# Patient Record
Sex: Female | Born: 1935 | Hispanic: No | Smoking: Never smoker
Health system: Southern US, Community
[De-identification: ages and names within clinical notes are randomized; demographics above are authoritative.]

## PROBLEM LIST (undated history)

## (undated) DIAGNOSIS — M1990 Unspecified osteoarthritis, unspecified site: Secondary | ICD-10-CM

## (undated) DIAGNOSIS — R251 Tremor, unspecified: Secondary | ICD-10-CM

## (undated) DIAGNOSIS — C801 Malignant (primary) neoplasm, unspecified: Secondary | ICD-10-CM

## (undated) DIAGNOSIS — E119 Type 2 diabetes mellitus without complications: Secondary | ICD-10-CM

## (undated) DIAGNOSIS — I10 Essential (primary) hypertension: Secondary | ICD-10-CM

## (undated) DIAGNOSIS — I4891 Unspecified atrial fibrillation: Secondary | ICD-10-CM

## (undated) DIAGNOSIS — G4730 Sleep apnea, unspecified: Secondary | ICD-10-CM

## (undated) DIAGNOSIS — D649 Anemia, unspecified: Secondary | ICD-10-CM

## (undated) HISTORY — DX: Type 2 diabetes mellitus without complications: E11.9

## (undated) HISTORY — PX: APPENDECTOMY: SHX54

## (undated) HISTORY — DX: Tremor, unspecified: R25.1

## (undated) HISTORY — PX: OTHER SURGICAL HISTORY: SHX169

## (undated) HISTORY — DX: Essential (primary) hypertension: I10

## (undated) HISTORY — PX: HERNIA REPAIR: SHX51

## (undated) HISTORY — PX: CARPAL TUNNEL RELEASE: SHX101

## (undated) HISTORY — PX: CATARACT EXTRACTION: SUR2

---

## 2001-03-30 ENCOUNTER — Emergency Department (HOSPITAL_COMMUNITY): Admission: EM | Admit: 2001-03-30 | Discharge: 2001-03-30 | Payer: Self-pay | Admitting: Emergency Medicine

## 2001-04-08 ENCOUNTER — Emergency Department (HOSPITAL_COMMUNITY): Admission: EM | Admit: 2001-04-08 | Discharge: 2001-04-08 | Payer: Self-pay | Admitting: Emergency Medicine

## 2001-07-02 ENCOUNTER — Ambulatory Visit (HOSPITAL_COMMUNITY): Admission: RE | Admit: 2001-07-02 | Discharge: 2001-07-02 | Payer: Self-pay | Admitting: Internal Medicine

## 2001-07-02 ENCOUNTER — Encounter: Payer: Self-pay | Admitting: Internal Medicine

## 2001-12-31 ENCOUNTER — Ambulatory Visit (HOSPITAL_COMMUNITY): Admission: RE | Admit: 2001-12-31 | Discharge: 2001-12-31 | Payer: Self-pay | Admitting: Internal Medicine

## 2001-12-31 ENCOUNTER — Encounter: Payer: Self-pay | Admitting: Internal Medicine

## 2002-04-07 ENCOUNTER — Encounter: Admission: RE | Admit: 2002-04-07 | Discharge: 2002-04-07 | Payer: Self-pay | Admitting: Gastroenterology

## 2002-04-07 ENCOUNTER — Encounter: Payer: Self-pay | Admitting: Gastroenterology

## 2003-03-10 ENCOUNTER — Ambulatory Visit (HOSPITAL_COMMUNITY): Admission: RE | Admit: 2003-03-10 | Discharge: 2003-03-10 | Payer: Self-pay | Admitting: Internal Medicine

## 2003-03-10 ENCOUNTER — Encounter: Payer: Self-pay | Admitting: Internal Medicine

## 2003-10-10 ENCOUNTER — Encounter: Admission: RE | Admit: 2003-10-10 | Discharge: 2003-10-10 | Payer: Self-pay | Admitting: Gastroenterology

## 2003-11-16 ENCOUNTER — Ambulatory Visit (HOSPITAL_COMMUNITY): Admission: RE | Admit: 2003-11-16 | Discharge: 2003-11-16 | Payer: Self-pay | Admitting: Gastroenterology

## 2005-05-16 ENCOUNTER — Ambulatory Visit (HOSPITAL_COMMUNITY): Admission: RE | Admit: 2005-05-16 | Discharge: 2005-05-16 | Payer: Self-pay | Admitting: Internal Medicine

## 2005-05-28 ENCOUNTER — Ambulatory Visit (HOSPITAL_COMMUNITY): Admission: RE | Admit: 2005-05-28 | Discharge: 2005-05-28 | Payer: Self-pay | Admitting: Internal Medicine

## 2005-09-10 ENCOUNTER — Ambulatory Visit (HOSPITAL_COMMUNITY): Admission: RE | Admit: 2005-09-10 | Discharge: 2005-09-10 | Payer: Self-pay | Admitting: Internal Medicine

## 2006-12-02 ENCOUNTER — Ambulatory Visit (HOSPITAL_COMMUNITY): Admission: RE | Admit: 2006-12-02 | Discharge: 2006-12-02 | Payer: Self-pay | Admitting: Internal Medicine

## 2007-04-27 ENCOUNTER — Ambulatory Visit (HOSPITAL_BASED_OUTPATIENT_CLINIC_OR_DEPARTMENT_OTHER): Admission: RE | Admit: 2007-04-27 | Discharge: 2007-04-27 | Payer: Self-pay | Admitting: Orthopedic Surgery

## 2009-01-30 ENCOUNTER — Ambulatory Visit: Payer: Self-pay | Admitting: Cardiovascular Disease

## 2009-02-26 ENCOUNTER — Telehealth (INDEPENDENT_AMBULATORY_CARE_PROVIDER_SITE_OTHER): Payer: Self-pay | Admitting: *Deleted

## 2009-02-26 ENCOUNTER — Ambulatory Visit: Payer: Self-pay

## 2009-02-27 ENCOUNTER — Encounter: Payer: Self-pay | Admitting: Cardiology

## 2009-02-27 ENCOUNTER — Ambulatory Visit: Payer: Self-pay

## 2009-03-10 DIAGNOSIS — R079 Chest pain, unspecified: Secondary | ICD-10-CM | POA: Insufficient documentation

## 2009-03-10 DIAGNOSIS — R0602 Shortness of breath: Secondary | ICD-10-CM | POA: Insufficient documentation

## 2009-03-10 DIAGNOSIS — R911 Solitary pulmonary nodule: Secondary | ICD-10-CM

## 2009-06-12 ENCOUNTER — Ambulatory Visit: Payer: Self-pay | Admitting: Vascular Surgery

## 2009-06-12 ENCOUNTER — Encounter: Payer: Self-pay | Admitting: Internal Medicine

## 2009-06-12 ENCOUNTER — Ambulatory Visit (HOSPITAL_COMMUNITY): Admission: RE | Admit: 2009-06-12 | Discharge: 2009-06-12 | Payer: Self-pay | Admitting: Internal Medicine

## 2010-08-23 ENCOUNTER — Encounter: Admission: RE | Admit: 2010-08-23 | Discharge: 2010-08-23 | Payer: Self-pay | Admitting: Internal Medicine

## 2011-01-28 ENCOUNTER — Ambulatory Visit (HOSPITAL_COMMUNITY)
Admission: RE | Admit: 2011-01-28 | Discharge: 2011-01-28 | Disposition: A | Discharge status: Home / Self Care | Payer: Medicare Other | Source: Ambulatory Visit | Attending: Internal Medicine | Admitting: Internal Medicine

## 2011-01-28 ENCOUNTER — Other Ambulatory Visit: Payer: Self-pay | Admitting: Internal Medicine

## 2011-01-28 DIAGNOSIS — R05 Cough: Secondary | ICD-10-CM | POA: Insufficient documentation

## 2011-01-28 DIAGNOSIS — R52 Pain, unspecified: Secondary | ICD-10-CM

## 2011-01-28 DIAGNOSIS — M47814 Spondylosis without myelopathy or radiculopathy, thoracic region: Secondary | ICD-10-CM | POA: Insufficient documentation

## 2011-01-28 DIAGNOSIS — I517 Cardiomegaly: Secondary | ICD-10-CM | POA: Insufficient documentation

## 2011-01-28 DIAGNOSIS — I10 Essential (primary) hypertension: Secondary | ICD-10-CM | POA: Insufficient documentation

## 2011-01-28 DIAGNOSIS — R509 Fever, unspecified: Secondary | ICD-10-CM | POA: Insufficient documentation

## 2011-01-28 DIAGNOSIS — R059 Cough, unspecified: Secondary | ICD-10-CM | POA: Insufficient documentation

## 2011-03-19 ENCOUNTER — Other Ambulatory Visit: Payer: Self-pay | Admitting: Neurosurgery

## 2011-03-19 DIAGNOSIS — M545 Low back pain: Secondary | ICD-10-CM

## 2011-03-26 ENCOUNTER — Ambulatory Visit
Admission: RE | Admit: 2011-03-26 | Discharge: 2011-03-26 | Disposition: A | Discharge status: Home / Self Care | Payer: Medicare Other | Source: Ambulatory Visit | Attending: Neurosurgery | Admitting: Neurosurgery

## 2011-03-26 DIAGNOSIS — M545 Low back pain: Secondary | ICD-10-CM

## 2011-04-01 NOTE — Op Note (Signed)
NAMEBRENLY, Anne Roach NO.:  [id number]   MEDICAL RECORD NO.:  [id number]          PATIENT TYPE:  AMB   LOCATION:  DSC                          FACILITY:  [facility]   PHYSICIAN:  Leonides Grills, M.D.     DATE OF BIRTH:  01/24/1933   DATE OF PROCEDURE:  04/27/2007  DATE OF DISCHARGE:                               OPERATIVE REPORT   PREOPERATIVE DIAGNOSIS:  Bilateral tarsal tunnel syndrome.   POSTOPERATIVE DIAGNOSIS:  Bilateral tarsal tunnel syndrome.   OPERATION:  Bilateral tarsal tunnel release.   ANESTHESIA:  General.   SURGEON:  Leonides Grills, M.D.   ASSISTANT:  None.   TOURNIQUET TIME:  Approximately 45 minutes.   COMPLICATIONS:  None.   DISPOSITION:  Stable to PR.   INDICATIONS:  This is a 77 year old female who has had long standing  progressive burning pain on the plantar aspect of her foot and EMG nerve  conduction studies showed that she had bilateral tarsal tunnel syndrome.  She was consented for the above procedure.  All risks which include  infection, neurovascular injury, persistent pain, worsening pain, nerve  vessel injury specific to the posteromedial aspect of the ankle,  prolonged recovery were all explained, questions encouraged and  answered.   DESCRIPTION OF PROCEDURE:  The patient was brought to the operating room  and placed in the supine position. After adequate general endotracheal  anesthesia was administered as well as Ancef 1 gram IV piggyback,  bilateral lower extremities were prepped and draped in a sterile manner  to a proximally placed thigh tourniquet.  The limb was gravity saline  and the tourniquet elevated to 290 mmHg.  A curvilinear incision on the  posteromedial aspect of the right ankle was done.  Dissection was  carried down through the skin.  Hemostasis was obtained.  Flexor  retinaculum was then identified and this was then released with pickups  and scissors protecting the neurovascular structures deep to this  structure. Dissection was carried down distally to the abductor halluces  and the inner portion of the fascia was released, as well.  The  tourniquet was deflated, hemostasis was obtained.  There is no pulsatile  bleeding.  The area was copiously irrigated with normal saline.  The  subcu was closed with 3-0 Vicryl, the skin was closed with 4-0 nylon.  The same exact procedure was performed on the contralateral left side as  described previously.  Once the wounds were closed, a sterile dressing  was applied, a Cam walker boot was applied.  The patient was stable to  the PR.   POSTOPERATIVE COURSE:  The patient will follow up in two weeks.  At that  time, remove the dressing as well as suture.  She is then to go into a  normal shoe weight bearing as tolerated.  Elevation and active range of  motion of the toes is encouraged.  She is explained that it may take up  to six months to a year to get full relief from the surgery.      Leonides Grills, M.D.  Electronically Signed     PB/MEDQ  D:  04/27/2007  T:  04/27/2007  Job:  [id number]

## 2011-04-01 NOTE — Assessment & Plan Note (Signed)
[facility]                            CARDIOLOGY OFFICE NOTE   NAME:Coopman, SAVAHNA CASADOS                     MRN:          [id number]  DATE:01/30/2009                            DOB:          Aug 30, 1933    HISTORY OF PRESENT ILLNESS:  Ms. Westra is a very pleasant 77 year old  [place] female referred by Dr. Chestine Spore for chest pain, shortness  breath, and hypertension.  Last Tuesday, the patient was visiting  [place].  She had walked for a while, she then used a wheelchair to get  around.  However, to get to some of the higher rooms, she had to walk up  some stairs.  She developed an episode of shortness of breath and  substernal chest pain.  There is no diaphoresis.  The pain was in the  center of her chest, it radiated to her neck and down her arms.  It  lasted for 10-15 minutes.  She took a break and then felt okay.  She  subsequently saw Dr. Chestine Spore.  She has not had a recurrence.  In general,  the patient has some exertional dyspnea and fatigue.  She has  hypertension and has been compliant with her meds.  She has no  previously documented coronary artery disease.  Her activity is somewhat  limited by bilateral prior plantar fasciitis.  She has had tarsal tunnel  release bilaterally.   Dr. Chestine Spore did an EKG and felt it was different than her previous EKGs.  I do not have that EKG, but her EKG in the office today shows  significantly poor R-wave progression suggestive of a possible anterior  wall infarct.   The patient is originally from [place].  She has not had any cardiac  problems since coming to this country in 2002.  I do not have a  cholesterol on her.  She is a nonsmoker.   FAMILY HISTORY:  Negative for premature coronary artery  disease.   REVIEW OF SYSTEMS:  Otherwise negative.   PAST MEDICAL HISTORY:  Remarkable for previous umbilical hernia repair,  carpal tunnel surgery, and tarsal tunnel surgery in both feet.   MEDICATIONS:  Include  1. Amlodipine 10 mg a day.  2. Benazepril 20 mg a day.   SOCIAL HISTORY:  The patient is retired.  She came to this country in  2002 from [place].  Her daughters were in this country earlier, they came  over as children.  She does housework and activities of daily living and  cooks.  She has one grandchild.  She does not smoke or drink.   PHYSICAL EXAMINATION:  GENERAL:  Remarkable for pleasant [place]  female, in no distress.  VITAL SIGNS:  Her blood pressure is 130/80, pulse 71 and regular,  respiratory rate 14, afebrile.  Weight is 198.  HEENT:  Unremarkable.  NECK:  Carotids are normal without bruit.  No lymphadenopathy,  thyromegaly, or JVP elevation.  LUNGS:  Clear.  Good diaphragmatic motion.  No wheezing.  S1 and S2.  Normal heart sounds.  PMI normal.  ABDOMEN:  Benign.  Bowel sounds positive.  No AAA, no  tenderness, no  bruit, no hepatosplenomegaly, and no hepatojugular reflux.  EXTREMITIES:  Distal pulses are intact.  No edema.  NEUROLOGIC:  Nonfocal.  SKIN:  Warm and dry.  MUSCULOSKELETAL:  No muscular weakness.  She has bilateral tarsal tunnel  scars on the medial malleolus area.   Her EKG shows sinus rhythm with poor R-wave progression, possible old  anterior wall MI.   IMPRESSION:  1. Chest pain with abnormal EKG.  The patient unable to walk on a      treadmill due to previous plantar fasciitis and surgery.  Followup      adenosine Myoview.  2. Hypertension, currently well controlled.  Continue current dose of      ACE inhibitor and calcium channel blocker.  3. A tarsal tunnel surgery with bilateral chronic foot pain.  Follow      up with Dr. Lestine Box.   As long as the patient's stress Myoview is normal, we will see her on an  as-needed basis.  If it shows evidence of ischemia or previous  infarction, I will see her back in followup to probably recommend a  heart cath.   Her abnormal EKG may represent lead position and rotation from LVH, but  there is a very  much a lack of R-wave progression in the precordium.     Noralyn Pick. Eden Emms, MD, [facility]  Electronically Signed    PCN/MedQ  DD: 01/30/2009  DT: 01/31/2009  Job #: [id number]   cc:   Margaretmary Bayley, M.D.

## 2011-06-12 ENCOUNTER — Emergency Department (HOSPITAL_COMMUNITY)
Admission: EM | Admit: 2011-06-12 | Discharge: 2011-06-12 | Disposition: A | Discharge status: Home / Self Care | Payer: Medicare Other | Attending: Emergency Medicine | Admitting: Emergency Medicine

## 2011-06-12 ENCOUNTER — Emergency Department (HOSPITAL_COMMUNITY): Payer: Medicare Other

## 2011-06-12 ENCOUNTER — Other Ambulatory Visit: Payer: Self-pay | Admitting: Cardiovascular Disease

## 2011-06-12 DIAGNOSIS — N390 Urinary tract infection, site not specified: Secondary | ICD-10-CM | POA: Insufficient documentation

## 2011-06-12 DIAGNOSIS — I10 Essential (primary) hypertension: Secondary | ICD-10-CM | POA: Insufficient documentation

## 2011-06-12 DIAGNOSIS — E119 Type 2 diabetes mellitus without complications: Secondary | ICD-10-CM | POA: Insufficient documentation

## 2011-06-12 DIAGNOSIS — I517 Cardiomegaly: Secondary | ICD-10-CM | POA: Insufficient documentation

## 2011-06-12 DIAGNOSIS — R072 Precordial pain: Secondary | ICD-10-CM | POA: Insufficient documentation

## 2011-06-12 DIAGNOSIS — R079 Chest pain, unspecified: Secondary | ICD-10-CM

## 2011-06-12 LAB — CBC
HCT: 42.6 % (ref 36.0–46.0)
Hemoglobin: 14.1 g/dL (ref 12.0–15.0)
MCH: 29.9 pg (ref 26.0–34.0)
MCHC: 33.1 g/dL (ref 30.0–36.0)
MCV: 90.4 fL (ref 78.0–100.0)
Platelets: 180 10*3/uL (ref 150–400)
RBC: 4.71 MIL/uL (ref 3.87–5.11)
RDW: 14.4 % (ref 11.5–15.5)
WBC: 9 10*3/uL (ref 4.0–10.5)

## 2011-06-12 LAB — URINE MICROSCOPIC-ADD ON

## 2011-06-12 LAB — URINALYSIS, ROUTINE W REFLEX MICROSCOPIC
Bilirubin Urine: NEGATIVE
Glucose, UA: 500 mg/dL — AB
Hgb urine dipstick: NEGATIVE
Ketones, ur: NEGATIVE mg/dL
Nitrite: POSITIVE — AB
Protein, ur: NEGATIVE mg/dL
Specific Gravity, Urine: 1.027 (ref 1.005–1.030)
Urobilinogen, UA: 0.2 mg/dL (ref 0.0–1.0)
pH: 5 (ref 5.0–8.0)

## 2011-06-12 LAB — DIFFERENTIAL
Basophils Absolute: 0 10*3/uL (ref 0.0–0.1)
Basophils Relative: 0 % (ref 0–1)
Eosinophils Absolute: 0.1 10*3/uL (ref 0.0–0.7)
Eosinophils Relative: 1 % (ref 0–5)
Lymphocytes Relative: 40 % (ref 12–46)
Lymphs Abs: 3.6 10*3/uL (ref 0.7–4.0)
Monocytes Absolute: 0.8 10*3/uL (ref 0.1–1.0)
Monocytes Relative: 9 % (ref 3–12)
Neutro Abs: 4.5 10*3/uL (ref 1.7–7.7)
Neutrophils Relative %: 50 % (ref 43–77)

## 2011-06-12 LAB — CK TOTAL AND CKMB (NOT AT ARMC)
CK, MB: 1.9 ng/mL (ref 0.3–4.0)
Relative Index: INVALID (ref 0.0–2.5)
Total CK: 33 U/L (ref 7–177)

## 2011-06-12 LAB — BASIC METABOLIC PANEL
BUN: 14 mg/dL (ref 6–23)
CO2: 27 mEq/L (ref 19–32)
Calcium: 9.2 mg/dL (ref 8.4–10.5)
Chloride: 102 mEq/L (ref 96–112)
Creatinine, Ser: 0.81 mg/dL (ref 0.50–1.10)
GFR calc Af Amer: >60 mL/min (ref >60)
GFR calc non Af Amer: >60 mL/min (ref >60)
Glucose, Bld: 254 mg/dL — ABNORMAL HIGH (ref 70–99)
Potassium: 4 mEq/L (ref 3.5–5.1)
Sodium: 139 mEq/L (ref 135–145)

## 2011-06-12 LAB — TROPONIN I: Troponin I: <0.3 ng/mL (ref <0.30)

## 2011-06-14 LAB — URINE CULTURE
Colony Count: >=100000
Culture  Setup Time: 201207261650

## 2011-06-16 MED ORDER — NITROGLYCERIN 0.4 MG SL SUBL: 0.4000 mg | SUBLINGUAL_TABLET | SUBLINGUAL | Status: AC | PRN

## 2011-06-17 ENCOUNTER — Ambulatory Visit (HOSPITAL_COMMUNITY): Payer: Medicare Other | Attending: Cardiovascular Disease | Admitting: Radiology

## 2011-06-17 VITALS — Ht 66.0 in | Wt 190.0 lb

## 2011-06-17 DIAGNOSIS — I4949 Other premature depolarization: Secondary | ICD-10-CM

## 2011-06-17 DIAGNOSIS — R079 Chest pain, unspecified: Secondary | ICD-10-CM | POA: Insufficient documentation

## 2011-06-17 MED ORDER — TECHNETIUM TC 99M TETROFOSMIN IV KIT
11.0000 | PACK | Freq: Once | INTRAVENOUS | Status: AC | PRN
  Administered 2011-06-17: 11 via INTRAVENOUS

## 2011-06-17 MED ORDER — TECHNETIUM TC 99M TETROFOSMIN IV KIT
33.0000 | PACK | Freq: Once | INTRAVENOUS | Status: AC | PRN
  Administered 2011-06-17: 33 via INTRAVENOUS

## 2011-06-17 MED ORDER — REGADENOSON 0.4 MG/5ML IV SOLN
0.4000 mg | Freq: Once | INTRAVENOUS | Status: AC
  Administered 2011-06-17: 0.4 mg via INTRAVENOUS

## 2011-06-17 NOTE — Progress Notes (Signed)
[facility] SITE 3 NUCLEAR MED [address] [place] [address] [phone]  Cardiology Nuclear Med Study  Anne Roach is a 77 y.o. female [id number] 1933/07/23   Nuclear Med Background Indication for Stress Test:  Evaluation for Ischemia and Post Hospital:06/12/11 ED with CP, (-)enzymes History:02/27/09-Myocardial Perfusion Study(NL EF=67%),Palpitations Cardiac Risk Factors: Family History - CAD, Hypertension and NIDDM  Symptoms:  Chest Pain, Diaphoresis, Dizziness, DOE, Light-Headedness, Palpitations and Syncope   Nuclear Pre-Procedure Caffeine/Decaff Intake:  None NPO After: 7:00am   Lungs:  CLEAR IV 0.9% NS with Angio Cath:  20g  IV Site: L Antecubital  IV Started by:  Irean Hong, RN  Chest Size (in):  38 Cup Size: B  Height: 5\' 6"  (1.676 m)  Weight:  190 lb (86.183 kg)  BMI:  Body mass index is 30.67 kg/(m^2). Tech Comments:  n/a    Nuclear Med Study 1 or 2 day study: 1 day  Stress Test Type:  Eugenie Birks  Reading MD: [name], MD  Order Authorizing Provider:  P.Nishan,MD  Resting Radionuclide: Technetium 32m Tetrofosmin  Resting Radionuclide Dose: 11.0 mCi   Stress Radionuclide:  Technetium 21m Tetrofosmin  Stress Radionuclide Dose: 33.0 mCi           Stress Protocol Rest HR: 68 Stress HR: 76  Rest BP: 142/87 Stress BP: 143/77  Exercise Time (min): n/a METS: n/a   Predicted Max HR: 144 bpm % Max HR: 52.78 bpm Rate Pressure Product: 21308   Dose of Adenosine (mg):  n/a Dose of Lexiscan: 0.4 mg  Dose of Atropine (mg): n/a Dose of Dobutamine: n/a mcg/kg/min (at max HR)  Stress Test Technologist: Frederick Peers, EMT-P  Nuclear Technologist:  Doyne Keel, CNMT     Rest Procedure:  Myocardial perfusion imaging was performed at rest 45 minutes following the intravenous administration of Technetium 11m Tetrofosmin. Rest ECG: SR with frequent PVCs, initial run of trigemeny  Stress Procedure:  The patient received IV Lexiscan 0.4 mg over  15-seconds.  Technetium 40m Tetrofosmin injected at 30-seconds.  There were no significant changes with Lexiscan.frequent PVCs throughout test.  Quantitative spect images were obtained after a 45 minute delay. Stress ECG: No significant ST segment change suggestive of ischemia.  QPS Raw Data Images:  Normal; no motion artifact; normal heart/lung ratio. Stress Images:  Normal homogeneous uptake in all areas of the myocardium. Rest Images:  Normal homogeneous uptake in all areas of the myocardium. Subtraction (SDS):  No evidence of ischemia. Transient Ischemic Dilatation (Normal <1.22):  0.92 Lung/Heart Ratio (Normal <0.45):  0.35  Quantitative Gated Spect Images QGS EDV:  NA QGS ESV:NA QGS cine images:  NA QGS EF: NA  Impression Exercise Capacity:  Lexiscan with no exercise. BP Response:  Normal blood pressure response. Clinical Symptoms:  No chest pain. ECG Impression:  No significant ST segment change suggestive of ischemia. Comparison with Prior Nuclear Study: No significant change from previous study  Overall Impression:  Low risk stress nuclear study.  No ischemia.  Ventricular function not available because of nongated study because of frequent ectopy.   Cassell Clement

## 2011-06-18 NOTE — Progress Notes (Signed)
Nuclear report routed to Dr. Nishan. Anne Roach  

## 2011-06-23 NOTE — Progress Notes (Signed)
pt aware of results Anne Roach  

## 2011-07-02 ENCOUNTER — Telehealth: Payer: Self-pay | Admitting: Cardiovascular Disease

## 2011-07-02 NOTE — Telephone Encounter (Signed)
Pt call pt wants to know results of stress test. Please return pt call to advise/discuss.

## 2011-07-02 NOTE — Telephone Encounter (Signed)
Pt aware of results, results faxed to dr Chestine Spore at her request Deliah Goody

## 2011-07-08 NOTE — Consult Note (Signed)
NAMEHAJER, DWYER NO.:  [id number]  MEDICAL RECORD NO.:  [id number]  LOCATION:  [facility]                         FACILITY:  [facility]  PHYSICIAN:  Noralyn Pick. Eden Emms, MD, FACCDATE OF BIRTH:  10-02-1933  DATE OF CONSULTATION:  06/12/2011 DATE OF DISCHARGE:  06/12/2011                                CONSULTATION   A 77 year old patient I have seen previously in March 2010 presents to the ER with three discrete episodes of chest pain today.  In general, she has not had any chest pain since I last saw her in 2010.  Her coronary risk factors include hypertension and diabetes.  Her diabetes would appear somewhat poorly controlled.  Her blood sugars run in 130- 150 in the mornings and apparently, her hemoglobin A1c was in the 8 range.  She woke this morning and while watching TV, had about a minute episode of pain in her shoulders and left side of her chest.  It was a squeezing-type sensation.  It was relieved spontaneously, but recurred twice.  She has not had any pain since she has been in the emergency room.  There is no associated shortness of breath, diaphoresis, palpitations, or presyncope.  The patient is currently comfortable with stable hemodynamics and pain- free.  Her enzymes are negative.  Her electrocardiogram has shown sinus rhythm with no acute changes.  She has an occasional PVC and poor R-wave progression.  There has been no change in her ECG since 2010.  She also has left axis deviation.  I had the patient's 10-point review of systems otherwise negative.  FAMILY HISTORY:  Negative for premature coronary artery disease, positive for diabetes on mother's side.  PAST MEDICAL HISTORY:  Remarkable for umbilical hernia repair, carpal tunnel surgery, tarsal tunnel surgery bilaterally with chronic foot problems.  She has required epidurals for this most recently 2 weeks ago.  She does not have a list of her medications.  She is on oral hypoglycemic, which  she does not know and also on amlodipine 10 mg a day.  She denies any allergies.  The patient is retired.  She came to this country in 2002 from [place].  Her daughter was with air.  She basically does activities of daily living and is sedentary.  She does not smoke or drink.  PHYSICAL EXAMINATION:  GENERAL:  Remarkable for a comfortable female in no pain, blood pressure is 130/60, pulse 70 and regular, respiratory rate 14, afebrile. HEENT:  Unremarkable. NECK:  Carotids are normal without bruit.  No lymphadenopathy, thyromegaly, or JVP elevation. LUNGS:  Clear.  Good diaphragmatic motion.  No wheezing.  S1 and S2, normal heart sounds.  PMI normal. ABDOMEN:  Benign.  Bowel sounds positive.  No AAA.  No tenderness.  No bruit.  No hepatosplenomegaly or hepatojugular reflux, or tenderness. EXTREMITIES:  Distal pulses are intact with trace edema.  She is status post tarsal tunnel surgery bilaterally with hammertoes.  EKG shows sinus rhythm with poor R-wave progression, left axis deviation and a single PVC.  No acute ST elevation.  No acute T-wave changes.  Lab work is remarkable for negative CPK, negative troponin.  CBC showed hematocrit of 42.6.  BMET showing  potassium of 4, creatinine of 0.8. Diagnostic chest x-ray shows question of cardiomegaly with no acute process.  IMPRESSION: 1. Chest pain, atypical features, self-limited, previously normal     Myoview in our office in 2010.  I talked to the patient and her     daughter.  I gave them the opportunity of coming into the hospital     overnight and doing a Myoview scan tomorrow morning or going home     and having this scheduled as an outpatient.  I think she is low     risk for cardiac event.  She prefers to go home.  I will call in a     prescription for nitroglycerin to the [facility] on [address].     She knows she can come back to the emergency room if her episodes     recur.  I decided not to do a cardiac CT and the patient  since she     is elderly with diabetes and is likely to have a higher calcium     score that may make interpretation more difficult also she was     having an occasional benign PVC. We have an old Myoview to compare, and I think this is the best option for her.  She will need Lexiscan scan as she does not walk well due to her plantar fasciitis surgery. 1. Diabetes.  Encouraged her to follow up with Dr. Chestine Spore, told her     that a hemoglobin A1c of 8 is unacceptable as her morning sugar is     of 130-150.  She appears to be only on a single oral hypoglycemic     agent. 2. Hypertension currently well controlled.  Continue current     medications including amlodipine and low-sodium diet. I have already made an appointment for her June 17, 2011, at noon at our office to have a [employer], and we will call a prescription for nitroglycerin.  The details of this plan were discussed with the daughter and the patient.  They are both agreeable.     Noralyn Pick. Eden Emms, MD, [facility]     PCN/MEDQ  D:  06/12/2011  T:  06/13/2011  Job:  [id number]  Electronically Signed by [name] MD [facility] on 07/08/2011 09:21:10 AM

## 2011-09-04 LAB — BASIC METABOLIC PANEL
CO2: 29
Calcium: 9.3
Creatinine, Ser: 0.69
GFR calc Af Amer: >60

## 2011-09-04 LAB — POCT HEMOGLOBIN-HEMACUE: Hemoglobin: 17.1 — ABNORMAL HIGH

## 2011-12-17 DIAGNOSIS — R5381 Other malaise: Secondary | ICD-10-CM | POA: Diagnosis not present

## 2011-12-17 DIAGNOSIS — D649 Anemia, unspecified: Secondary | ICD-10-CM | POA: Diagnosis not present

## 2011-12-17 DIAGNOSIS — I10 Essential (primary) hypertension: Secondary | ICD-10-CM | POA: Diagnosis not present

## 2011-12-17 DIAGNOSIS — E7800 Pure hypercholesterolemia, unspecified: Secondary | ICD-10-CM | POA: Diagnosis not present

## 2011-12-17 DIAGNOSIS — M2550 Pain in unspecified joint: Secondary | ICD-10-CM | POA: Diagnosis not present

## 2011-12-17 DIAGNOSIS — L659 Nonscarring hair loss, unspecified: Secondary | ICD-10-CM | POA: Diagnosis not present

## 2012-02-03 ENCOUNTER — Ambulatory Visit (HOSPITAL_BASED_OUTPATIENT_CLINIC_OR_DEPARTMENT_OTHER): Payer: Medicare Other | Attending: Internal Medicine | Admitting: Radiology

## 2012-02-03 VITALS — Ht 66.0 in | Wt 198.0 lb

## 2012-02-03 DIAGNOSIS — G4733 Obstructive sleep apnea (adult) (pediatric): Secondary | ICD-10-CM | POA: Diagnosis not present

## 2012-02-03 DIAGNOSIS — Z9989 Dependence on other enabling machines and devices: Secondary | ICD-10-CM

## 2012-02-15 DIAGNOSIS — R0609 Other forms of dyspnea: Secondary | ICD-10-CM | POA: Diagnosis not present

## 2012-02-15 DIAGNOSIS — R0989 Other specified symptoms and signs involving the circulatory and respiratory systems: Secondary | ICD-10-CM

## 2012-02-15 DIAGNOSIS — I4949 Other premature depolarization: Secondary | ICD-10-CM | POA: Diagnosis not present

## 2012-02-15 DIAGNOSIS — I491 Atrial premature depolarization: Secondary | ICD-10-CM

## 2012-02-15 DIAGNOSIS — G4733 Obstructive sleep apnea (adult) (pediatric): Secondary | ICD-10-CM | POA: Diagnosis not present

## 2012-02-16 NOTE — Procedures (Signed)
NAMEZELTA, ENFIELD NO.:  [id number]  MEDICAL RECORD NO.:  [id number]          PATIENT TYPE:  OUT  LOCATION:  SLEEP CENTER                 FACILITY[facility]  [facility]  PHYSICIAN:  Barbaraann Share, MD,FCCPDATE OF BIRTH:  [birth year] 1934  DATE OF STUDY:  02/03/2012                           NOCTURNAL POLYSOMNOGRAM  REFERRING PHYSICIAN:  Margaretmary Bayley, M.D.  LOCATION:  Sleep Lab.  REFERRING PHYSICIAN:  Margaretmary Bayley, M.D.  INDICATION FOR STUDY:  Hypersomnia with sleep apnea.  EPWORTH SLEEPINESS SCORE:  8.  MEDICATIONS:  SLEEP ARCHITECTURE:  The patient had total sleep time of 322 minutes with no slow-wave sleep and only 23 minutes of REM.  Sleep onset latency was normal at 24 minutes, and REM did not occur until the titration portion of the sleep study.  Sleep efficiency was poor at 77% during the diagnostic portion, and 80% during the titration portion.  RESPIRATORY DATA:  The patient underwent a split night protocol, where she was found to have 178 obstructive events in the 1st 123 minutes of sleep.  This gave her an apnea-hypopnea index of 87 events per hour during the diagnostic portion of the study.  The events occurred all in the supine position and there was moderate to loud snoring noted throughout.  By protocol, the patient was then fitted with a medium ResMed Mirage Quattro full face mask, and CPAP titration was initiated. At a CPAP pressure of 19 cm of water, she continued to have breakthrough events and was having difficulty with tolerance, and so therefore, was changed to bilevel.  Her bilevel pressure was increased as high as 25/20, but there were was very little time left for further titration. Optimal pressure was never defined.  OXYGEN DATA:  There was O2 desaturation as low as 87% with the patient's obstructive events.  CARDIAC DATA:  Rare PAC and PVC noted, but no clinically significant arrhythmias were seen.  MOVEMENT-PARASOMNIA:  The patient had  no significant leg jerks or other abnormal behaviors noted.  IMPRESSION-RECOMMENDATIONS: 1. Split night study reveals severe obstructive sleep apnea with an     AHI of 87 events per hour during the diagnostic portion of the     study, and oxygen desaturation as low as 87%.  The patient was then     fitted with a medium ResMed Mirage Quattro full face mask, and CPAP     titration was initiated.  At a CPAP pressure of 19 cm of water, she     continued to have breakthrough events and had issues with pressure     tolerance, and therefore, was changed to bilevel.  Unfortunately,     there is very little time left for appropriate bilevel titration,     and therefore I would recommend either an auto-titrating device at     home for a period of a few weeks versus a return to the sleep     center for formal titration.  The patient should also be encouraged     to work aggressively on weight loss. 2. Rare PAC and PVC noted, but no clinically significant arrhythmias     were seen.     Barbaraann Share, MD,FCCP Diplomate,  American Board of Sleep Medicine    [facility]/MEDQ  D:  02/15/2012 16:21:39  T:  02/16/2012 03:40:41  Job:  [id number]

## 2012-05-04 ENCOUNTER — Telehealth: Payer: Self-pay | Admitting: Pulmonary Disease

## 2012-05-04 NOTE — Telephone Encounter (Signed)
Pt was referred to [place] by Dr. Chestine Spore on 04/21/2012.  Mackie Pai stated that there was a sleep study completed in March (comp 02/03/12).  [place] explained that there was a lot of difficulty obtaining the sleep study results which is the reason for the delay in setting up the sleep consult.  Alex kept stating that waiting over 4 months from when a sleep study was performed & getting a consult is not acceptable.  Alex requests to have the consult moved to a sooner date since the appt w/ [facility] was being moved by no fault of the pt.  Antionette Fairy

## 2012-05-04 NOTE — Telephone Encounter (Signed)
[facility] had openings 7.1.13 @ 1015.  Called spoke with pt's daughter [name] and apologized for the inconvenience - appt rsc with [facility].  [name] okay with this date and time.  Nothing further needed, will sign off.

## 2012-05-11 ENCOUNTER — Institutional Professional Consult (permissible substitution): Payer: Medicare Other | Admitting: Pulmonary Disease

## 2012-05-17 ENCOUNTER — Institutional Professional Consult (permissible substitution): Payer: Medicare Other | Admitting: Pulmonary Disease

## 2012-06-09 ENCOUNTER — Institutional Professional Consult (permissible substitution): Payer: Medicare Other | Admitting: Pulmonary Disease

## 2012-06-25 ENCOUNTER — Institutional Professional Consult (permissible substitution): Payer: Medicare Other | Admitting: Pulmonary Disease

## 2012-06-29 ENCOUNTER — Ambulatory Visit (INDEPENDENT_AMBULATORY_CARE_PROVIDER_SITE_OTHER): Payer: Medicare Other | Admitting: Pulmonary Disease

## 2012-06-29 ENCOUNTER — Encounter: Payer: Self-pay | Admitting: Pulmonary Disease

## 2012-06-29 VITALS — BP 134/82 | HR 69 | Temp 97.9°F | Ht 66.0 in | Wt 195.4 lb

## 2012-06-29 DIAGNOSIS — G4733 Obstructive sleep apnea (adult) (pediatric): Secondary | ICD-10-CM | POA: Insufficient documentation

## 2012-06-29 DIAGNOSIS — E119 Type 2 diabetes mellitus without complications: Secondary | ICD-10-CM | POA: Insufficient documentation

## 2012-06-29 NOTE — Patient Instructions (Addendum)
Will start on cpap at a moderate pressure level to allow you to get used to the machine.   Please call if issues arise. Work on weight loss followup with me in 5-6 weeks.

## 2012-06-29 NOTE — Assessment & Plan Note (Signed)
The patient has been noted to have severe obstructive sleep apnea by Anne Roach recent sleep study.  She is symptomatic at night and during the day, and has underlying medical issues that can be significantly impacted by sleep disordered breathing.  I have had a long discussion with Anne Roach about the pathophysiology of sleep apnea, including its impact to Anne Roach quality of life and cardiovascular health.  She would benefit from starting on CPAP, as well as weight loss.  The patient is agreeable to trying this.  From Anne Roach split-night study, it appears that she needs very high pressures in order to totally control Anne Roach sleep apnea.  It is unlikely that she will be able to tolerate this, and therefore we may have to accept some breakthrough at lower pressures until she is able to lose weight

## 2012-06-29 NOTE — Progress Notes (Signed)
  Subjective:    Patient ID: Anne Roach, female    DOB: 1933-04-24, 78 y.o.   MRN: [id number]  HPI The patient is a very pleasant 78 -year-old female who I been asked to see for management of obstructive sleep apnea.  She has undergone nocturnal polysomnography, which showed an AHI of 87 events per hour.  She was started on CPAP, but there was not adequate time for optimal titration.  The patient has been noted to have loud snoring by her daughter, as well as an abnormal breathing pattern during sleep.  She is not rested in the mornings upon arising, and notes significant inappropriate daytime sleepiness.  The daughter notes that she has frequent dozing during the day.  The patient states that her weight is stable over the last few years, and her Epworth score today is abnormal at 12.  Sleep Questionnaire: What time do you typically go to bed?( Between what hours) 10-11pm How long does it take you to fall asleep? 5 minutes How many times during the night do you wake up? 1 What time do you get out of bed to start your day? 0400 Do you drive or operate heavy machinery in your occupation? No How much has your weight changed (up or down) over the past two years? (In pounds) 0 oz (0 kg) Have you ever had a sleep study before? Yes If yes, location of study? [facility] If yes, date of study? 03.19.2013 Do you currently use CPAP? No Do you wear oxygen at any time? No    Review of Systems  Constitutional: Negative for fever and unexpected weight change.  HENT: Positive for trouble swallowing. Negative for ear pain, nosebleeds, congestion, sore throat, rhinorrhea, sneezing, dental problem, postnasal drip and sinus pressure.   Eyes: Negative for redness and itching.  Respiratory: Positive for shortness of breath. Negative for cough, chest tightness and wheezing.   Cardiovascular: Positive for leg swelling. Negative for palpitations.  Gastrointestinal: Negative for nausea and vomiting.  Genitourinary:  Negative for dysuria.  Musculoskeletal: Positive for arthralgias. Negative for joint swelling.  Skin: Negative for rash.  Neurological: Negative for headaches.  Hematological: Does not bruise/bleed easily.  Psychiatric/Behavioral: Negative for dysphoric mood. The patient is not nervous/anxious.   All other systems reviewed and are negative.       Objective:   Physical Exam Constitutional:  Overweight female, no acute distress  HENT:  Nares patent without discharge but narrowed bilat  Oropharynx without exudate, palate and uvula are moderately elongated.   Eyes:  Perrla, eomi, no scleral icterus  Neck:  No JVD, no TMG  Cardiovascular:  Normal rate, regular rhythm, no rubs or gallops.  No murmurs        Intact distal pulses  Pulmonary :  Normal breath sounds, no stridor or respiratory distress   No rales, rhonchi, or wheezing  Abdominal:  Soft, nondistended, bowel sounds present.  No tenderness noted.   Musculoskeletal:  1+ lower extremity edema noted.  Lymph Nodes:  No cervical lymphadenopathy noted  Skin:  No cyanosis noted  Neurologic:  Alert, appropriate, moves all 4 extremities without obvious deficit.         Assessment & Plan:

## 2012-07-15 DIAGNOSIS — E7800 Pure hypercholesterolemia, unspecified: Secondary | ICD-10-CM | POA: Diagnosis not present

## 2012-07-15 DIAGNOSIS — E559 Vitamin D deficiency, unspecified: Secondary | ICD-10-CM | POA: Diagnosis not present

## 2012-07-15 DIAGNOSIS — I10 Essential (primary) hypertension: Secondary | ICD-10-CM | POA: Diagnosis not present

## 2012-08-11 ENCOUNTER — Ambulatory Visit: Payer: Medicare Other | Admitting: Pulmonary Disease

## 2012-09-01 ENCOUNTER — Encounter: Payer: Self-pay | Admitting: Pulmonary Disease

## 2012-09-01 ENCOUNTER — Ambulatory Visit (INDEPENDENT_AMBULATORY_CARE_PROVIDER_SITE_OTHER): Payer: Medicare Other | Admitting: Pulmonary Disease

## 2012-09-01 VITALS — BP 132/64 | HR 65 | Ht 66.0 in | Wt 199.4 lb

## 2012-09-01 DIAGNOSIS — G4733 Obstructive sleep apnea (adult) (pediatric): Secondary | ICD-10-CM

## 2012-09-01 NOTE — Assessment & Plan Note (Signed)
The patient is doing well with CPAP by her recent download, but she is going to need a little more pressure for complete treatment.  I will turn her machine to the automatic setting for the next few weeks, and will let her know the results of the download.  I've also encouraged her to work aggressively on weight loss.

## 2012-09-01 NOTE — Progress Notes (Signed)
  Subjective:    Patient ID: Anne Roach, female    DOB: 23-Nov-1932, 78 y.o.   MRN: [id number]  HPI The patient comes in today for followup of her known severe obstructive sleep apnea.  She is wearing CPAP fairly compliantly by her download, and is having no significant mask leaks.  She feels that she is sleeping better, and has improved alertness during the day.  Her family member has heard some breakthrough snoring, and states she still has some sleepiness during the day.  I have reminded them we have yet to optimize her pressure.   Review of Systems  Constitutional: Negative for fever and unexpected weight change.  HENT: Negative for ear pain, nosebleeds, congestion, sore throat, rhinorrhea, sneezing, trouble swallowing, dental problem, postnasal drip and sinus pressure.   Eyes: Negative for redness and itching.  Respiratory: Negative for cough, chest tightness, shortness of breath and wheezing.   Cardiovascular: Positive for leg swelling. Negative for palpitations.  Gastrointestinal: Negative for nausea and vomiting.  Genitourinary: Negative for dysuria.  Musculoskeletal: Negative for joint swelling.  Skin: Negative for rash.  Neurological: Negative for headaches.  Hematological: Does not bruise/bleed easily.  Psychiatric/Behavioral: Negative for dysphoric mood. The patient is not nervous/anxious.        Objective:   Physical Exam Overweight female in no acute distress Skin breakdown or pressure necrosis from the CPAP mask Neck without lymphadenopathy or thyromegaly Lower extremities with mild edema, no cyanosis Alert, does not appear to be sleepy, moves all 4 extremities.       Assessment & Plan:

## 2012-09-01 NOTE — Patient Instructions (Addendum)
Will optimize your pressure on the automatic setting for the next few weeks, then will let you know your optimal pressure once I get your download. Work on weight loss followup with me in 6mos, but call if having issues with your device.

## 2012-10-10 ENCOUNTER — Other Ambulatory Visit: Payer: Self-pay | Admitting: Pulmonary Disease

## 2012-10-10 DIAGNOSIS — G4733 Obstructive sleep apnea (adult) (pediatric): Secondary | ICD-10-CM

## 2012-10-12 DIAGNOSIS — Z23 Encounter for immunization: Secondary | ICD-10-CM | POA: Diagnosis not present

## 2012-10-12 DIAGNOSIS — M2550 Pain in unspecified joint: Secondary | ICD-10-CM | POA: Diagnosis not present

## 2012-10-12 DIAGNOSIS — IMO0002 Reserved for concepts with insufficient information to code with codable children: Secondary | ICD-10-CM | POA: Diagnosis not present

## 2012-10-12 DIAGNOSIS — I10 Essential (primary) hypertension: Secondary | ICD-10-CM | POA: Diagnosis not present

## 2012-10-19 ENCOUNTER — Encounter (HOSPITAL_COMMUNITY): Payer: Self-pay | Admitting: *Deleted

## 2012-10-19 ENCOUNTER — Emergency Department (HOSPITAL_COMMUNITY): Payer: Medicare Other

## 2012-10-19 ENCOUNTER — Emergency Department (HOSPITAL_COMMUNITY)
Admission: EM | Admit: 2012-10-19 | Discharge: 2012-10-19 | Disposition: A | Discharge status: Home / Self Care | Payer: Medicare Other | Attending: Emergency Medicine | Admitting: Emergency Medicine

## 2012-10-19 DIAGNOSIS — M25569 Pain in unspecified knee: Secondary | ICD-10-CM | POA: Diagnosis not present

## 2012-10-19 DIAGNOSIS — Z79899 Other long term (current) drug therapy: Secondary | ICD-10-CM | POA: Diagnosis not present

## 2012-10-19 DIAGNOSIS — I10 Essential (primary) hypertension: Secondary | ICD-10-CM | POA: Insufficient documentation

## 2012-10-19 DIAGNOSIS — E119 Type 2 diabetes mellitus without complications: Secondary | ICD-10-CM | POA: Diagnosis not present

## 2012-10-19 DIAGNOSIS — M549 Dorsalgia, unspecified: Secondary | ICD-10-CM | POA: Diagnosis not present

## 2012-10-19 DIAGNOSIS — M25559 Pain in unspecified hip: Secondary | ICD-10-CM | POA: Diagnosis not present

## 2012-10-19 DIAGNOSIS — M79609 Pain in unspecified limb: Secondary | ICD-10-CM | POA: Diagnosis not present

## 2012-10-19 MED ORDER — HYDROCODONE-ACETAMINOPHEN 5-325 MG PO TABS
1.0000 | ORAL_TABLET | Freq: Once | ORAL | Status: AC
  Administered 2012-10-19: 1 via ORAL
  Filled 2012-10-19: qty 1

## 2012-10-19 MED ORDER — HYDROCODONE-ACETAMINOPHEN 5-325 MG PO TABS: 1.0000 | ORAL_TABLET | Freq: Three times a day (TID) | ORAL | Status: DC | PRN

## 2012-10-19 NOTE — Progress Notes (Signed)
*  PRELIMINARY RESULTS* Vascular Ultrasound Left lower extremity venous duplex has been completed.  Preliminary findings: Left:  No evidence of DVT, superficial thrombosis, or Baker's cyst.   Farrel Demark, RDMS, RVT 10/19/2012, 4:52 PM

## 2012-10-19 NOTE — ED Provider Notes (Signed)
History    This chart was scribed for Gerhard Munch, MD, MD by Smitty Pluck, ED Scribe. The patient was seen in room TR11C and the patient's care was started at 3:19PM.   CSN: [id number]  Arrival date & time 10/19/12  1425   None     Chief Complaint  Patient presents with  . Knee Pain  . Back Pain    (Consider location/radiation/quality/duration/timing/severity/associated sxs/prior treatment) The history is provided by the patient. No language interpreter was used.   Anne Roach is a 78 y.o. female who presents to the Emergency Department complaining of constant, moderate left knee pain onset 2 days ago. Pt was walking up stairs and felt knee "pop." Pt has taken tramadol for pain without relief. She was given tramadol from PCP for back pain radiating to left leg pain 1 week ago. Pt reports bearing weight aggravates the pain. Pt has had chills. Denies fevers, dysuria, urinary incontinence, cough, SOB and any other pain.   Past Medical History  Diagnosis Date  . HTN (hypertension)   . DM (diabetes mellitus)     Past Surgical History  Procedure Date  . No past surgeries   . Hernia repair   . Appendectomy   . Carpal tunnel release     Family History  Problem Relation Age of Onset  . Diabetes Father   . Asthma Brother     History  Substance Use Topics  . Smoking status: Never Smoker   . Smokeless tobacco: Not on file  . Alcohol Use: No    OB History    Grav Para Term Preterm Abortions TAB SAB Ect Mult Living                  Review of Systems  Constitutional:       Per HPI, otherwise negative  HENT:       Per HPI, otherwise negative  Eyes: Negative.   Respiratory:       Per HPI, otherwise negative  Cardiovascular:       Per HPI, otherwise negative  Gastrointestinal: Negative for vomiting.  Genitourinary: Negative.   Musculoskeletal:       Per HPI, otherwise negative  Skin: Negative.   Neurological: Negative for syncope.    Allergies  Review  of patient's allergies indicates no known allergies.  Home Medications   Current Outpatient Rx  Name  Route  Sig  Dispense  Refill  . AMLODIPINE BESYLATE 10 MG PO TABS   Oral   Take 10 mg by mouth daily.         Marland Kitchen GLIMEPIRIDE 2 MG PO TABS   Oral   Take 2 mg by mouth daily before breakfast.         . OMEGA-3 FATTY ACIDS 166.7 MG PO CAPS   Oral   Take 1 capsule by mouth daily. Omega 7 fatty acids.           BP 139/69  Pulse 75  Temp 97.8 F (36.6 C) (Oral)  Resp 18  SpO2 92%  Physical Exam  Nursing note and vitals reviewed. Constitutional: She is oriented to person, place, and time. She appears well-developed and well-nourished. No distress.  HENT:  Head: Normocephalic and atraumatic.  Eyes: Conjunctivae normal are normal.  Neck: Neck supple. No tracheal deviation present.  Cardiovascular: Normal rate, regular rhythm and normal heart sounds.   Pulmonary/Chest: Effort normal and breath sounds normal. No respiratory distress. She has no wheezes.  Musculoskeletal: She exhibits no edema.  No tenderness of patellar  Tender in popliteal fossa   Neurological: She is alert and oriented to person, place, and time.  Skin: Skin is warm and dry.  Psychiatric: She has a normal mood and affect. Her behavior is normal.    ED Course  Procedures (including critical care time)   COORDINATION OF CARE: 3:24 PM Discussed ED treatment with pt     Labs Reviewed - No data to display No results found.   No diagnosis found.    MDM  I personally performed the services described in this documentation, which was scribed in my presence. The recorded information has been reviewed and is accurate.  This elderly female presents with the acute onset of left knee pain.  On exam the patient is uncomfortable, though in no distress.  The patient flexes and extends the knee, though his pain throughout range of motion.  Distal pulses are appropriate.  There's tenderness to palpation  in the posterior of the knee suspicious for a Baker's cyst.  Ultrasound and x-ray were both unremarkable.  Given the patient's description of the pop, her current inability to ambulate or bear weight there suspicion for meniscal injury.  The patient was discharged in stable condition after provision of an Ace wrap, prescription for a wheelchair, and with orthopedics followup.  Absent significant effusion, pain, there is low suspicion for occult fracture.  Gerhard Munch, MD 10/19/12 [phone]

## 2012-10-19 NOTE — ED Notes (Signed)
Provided pt's daughter with list of transportation resources for pt.

## 2012-10-19 NOTE — ED Notes (Signed)
Pt was seen last Wednesday by PMD for left lower back pain that radiated down left leg and then heard a pop behind left knee 2 days ago and now with swelling to knee and unable to bear weight.  Pulse present

## 2012-10-22 DIAGNOSIS — M25569 Pain in unspecified knee: Secondary | ICD-10-CM | POA: Diagnosis not present

## 2012-10-22 DIAGNOSIS — M25469 Effusion, unspecified knee: Secondary | ICD-10-CM | POA: Diagnosis not present

## 2012-11-23 DIAGNOSIS — H5200 Hypermetropia, unspecified eye: Secondary | ICD-10-CM | POA: Diagnosis not present

## 2012-11-23 DIAGNOSIS — H2510 Age-related nuclear cataract, unspecified eye: Secondary | ICD-10-CM | POA: Diagnosis not present

## 2012-11-23 DIAGNOSIS — H35039 Hypertensive retinopathy, unspecified eye: Secondary | ICD-10-CM | POA: Diagnosis not present

## 2012-12-15 DIAGNOSIS — I10 Essential (primary) hypertension: Secondary | ICD-10-CM | POA: Diagnosis not present

## 2012-12-15 DIAGNOSIS — M4800 Spinal stenosis, site unspecified: Secondary | ICD-10-CM | POA: Diagnosis not present

## 2012-12-22 DIAGNOSIS — I10 Essential (primary) hypertension: Secondary | ICD-10-CM | POA: Diagnosis not present

## 2012-12-22 DIAGNOSIS — G609 Hereditary and idiopathic neuropathy, unspecified: Secondary | ICD-10-CM | POA: Diagnosis not present

## 2012-12-22 DIAGNOSIS — E119 Type 2 diabetes mellitus without complications: Secondary | ICD-10-CM | POA: Diagnosis not present

## 2013-03-02 ENCOUNTER — Ambulatory Visit: Payer: Medicare Other | Admitting: Pulmonary Disease

## 2013-03-09 ENCOUNTER — Encounter: Payer: Self-pay | Admitting: Pulmonary Disease

## 2013-03-09 ENCOUNTER — Ambulatory Visit (INDEPENDENT_AMBULATORY_CARE_PROVIDER_SITE_OTHER): Payer: Medicare Other | Admitting: Pulmonary Disease

## 2013-03-09 VITALS — BP 130/74 | HR 66 | Temp 98.4°F | Ht 66.0 in | Wt 201.2 lb

## 2013-03-09 DIAGNOSIS — G4733 Obstructive sleep apnea (adult) (pediatric): Secondary | ICD-10-CM | POA: Diagnosis not present

## 2013-03-09 NOTE — Progress Notes (Signed)
  Subjective:    Patient ID: Anne Roach, female    DOB: July 13, 1933, 79 y.o.   MRN: [id number]  HPI The patient comes in today for followup of her obstructive sleep apnea.  She's been wearing CPAP component, but was unable to tolerate her optimal pressure.  Her CPAP was decreased to 14 cm, and her most recent download actually shows excellent control of her obstructive events at this pressure.  She also has very little mask leak.  The patient feels that she is sleeping well with the device, with improved daytime alertness.   Review of Systems  Constitutional: Negative for fever and unexpected weight change.  HENT: Negative for ear pain, nosebleeds, congestion, sore throat, rhinorrhea, sneezing, trouble swallowing, dental problem, postnasal drip and sinus pressure.   Eyes: Negative for redness and itching.  Respiratory: Negative for cough, chest tightness, shortness of breath and wheezing.   Cardiovascular: Negative for palpitations and leg swelling.  Gastrointestinal: Negative for nausea and vomiting.  Genitourinary: Negative for dysuria.  Musculoskeletal: Negative for joint swelling.  Skin: Negative for rash.  Neurological: Negative for headaches.  Hematological: Does not bruise/bleed easily.  Psychiatric/Behavioral: Negative for dysphoric mood. The patient is not nervous/anxious.        Objective:   Physical Exam Overweight female in nad Nose without purulence or discharge noted. No skin breakdown or pressure necrosis from cpap mask. Neck without LN or TMG LE with minimal edema, no cyanosis Alert, does not appear sleepy, moves all 4.        Assessment & Plan:

## 2013-03-09 NOTE — Patient Instructions (Addendum)
Stay on cpap, and work on weight loss Keep up with mask changes and supplies followup with me in one year.  

## 2013-03-09 NOTE — Assessment & Plan Note (Addendum)
The patient is currently doing well with CPAP, and is having no mask or pressure issues.  She has seen improvement in her symptoms.  I have asked her to keep up with her mask changes and supplies, and to followup with me in one year.  I have also encouraged her to work aggressively on weight loss.

## 2013-03-23 ENCOUNTER — Ambulatory Visit: Payer: Self-pay | Admitting: Nurse Practitioner

## 2013-06-09 DIAGNOSIS — I10 Essential (primary) hypertension: Secondary | ICD-10-CM | POA: Diagnosis not present

## 2013-06-09 DIAGNOSIS — R51 Headache: Secondary | ICD-10-CM | POA: Diagnosis not present

## 2013-06-09 DIAGNOSIS — M6530 Trigger finger, unspecified finger: Secondary | ICD-10-CM | POA: Diagnosis not present

## 2013-06-30 DIAGNOSIS — I10 Essential (primary) hypertension: Secondary | ICD-10-CM | POA: Diagnosis not present

## 2013-06-30 DIAGNOSIS — E7800 Pure hypercholesterolemia, unspecified: Secondary | ICD-10-CM | POA: Diagnosis not present

## 2013-06-30 DIAGNOSIS — J40 Bronchitis, not specified as acute or chronic: Secondary | ICD-10-CM | POA: Diagnosis not present

## 2013-08-17 DIAGNOSIS — I10 Essential (primary) hypertension: Secondary | ICD-10-CM | POA: Diagnosis not present

## 2013-08-17 DIAGNOSIS — R1012 Left upper quadrant pain: Secondary | ICD-10-CM | POA: Diagnosis not present

## 2013-10-05 DIAGNOSIS — E7800 Pure hypercholesterolemia, unspecified: Secondary | ICD-10-CM | POA: Diagnosis not present

## 2013-10-05 DIAGNOSIS — L659 Nonscarring hair loss, unspecified: Secondary | ICD-10-CM | POA: Diagnosis not present

## 2013-10-05 DIAGNOSIS — I10 Essential (primary) hypertension: Secondary | ICD-10-CM | POA: Diagnosis not present

## 2013-11-02 DIAGNOSIS — I10 Essential (primary) hypertension: Secondary | ICD-10-CM | POA: Diagnosis not present

## 2013-11-02 DIAGNOSIS — E7800 Pure hypercholesterolemia, unspecified: Secondary | ICD-10-CM | POA: Diagnosis not present

## 2013-12-21 DIAGNOSIS — I10 Essential (primary) hypertension: Secondary | ICD-10-CM | POA: Diagnosis not present

## 2013-12-21 DIAGNOSIS — IMO0001 Reserved for inherently not codable concepts without codable children: Secondary | ICD-10-CM | POA: Diagnosis not present

## 2013-12-21 DIAGNOSIS — K209 Esophagitis, unspecified without bleeding: Secondary | ICD-10-CM | POA: Diagnosis not present

## 2013-12-21 DIAGNOSIS — E7800 Pure hypercholesterolemia, unspecified: Secondary | ICD-10-CM | POA: Diagnosis not present

## 2014-02-20 DIAGNOSIS — E7800 Pure hypercholesterolemia, unspecified: Secondary | ICD-10-CM | POA: Diagnosis not present

## 2014-02-20 DIAGNOSIS — I10 Essential (primary) hypertension: Secondary | ICD-10-CM | POA: Diagnosis not present

## 2014-02-20 DIAGNOSIS — IMO0001 Reserved for inherently not codable concepts without codable children: Secondary | ICD-10-CM | POA: Diagnosis not present

## 2014-03-29 DIAGNOSIS — IMO0001 Reserved for inherently not codable concepts without codable children: Secondary | ICD-10-CM | POA: Diagnosis not present

## 2014-03-29 DIAGNOSIS — G589 Mononeuropathy, unspecified: Secondary | ICD-10-CM | POA: Diagnosis not present

## 2014-03-29 DIAGNOSIS — M2550 Pain in unspecified joint: Secondary | ICD-10-CM | POA: Diagnosis not present

## 2014-03-29 DIAGNOSIS — E7800 Pure hypercholesterolemia, unspecified: Secondary | ICD-10-CM | POA: Diagnosis not present

## 2014-03-29 DIAGNOSIS — I10 Essential (primary) hypertension: Secondary | ICD-10-CM | POA: Diagnosis not present

## 2014-04-11 DIAGNOSIS — H2589 Other age-related cataract: Secondary | ICD-10-CM | POA: Diagnosis not present

## 2014-04-11 DIAGNOSIS — E119 Type 2 diabetes mellitus without complications: Secondary | ICD-10-CM | POA: Diagnosis not present

## 2014-05-10 DIAGNOSIS — H2589 Other age-related cataract: Secondary | ICD-10-CM | POA: Diagnosis not present

## 2014-05-16 DIAGNOSIS — I10 Essential (primary) hypertension: Secondary | ICD-10-CM | POA: Diagnosis not present

## 2014-05-16 DIAGNOSIS — E119 Type 2 diabetes mellitus without complications: Secondary | ICD-10-CM | POA: Diagnosis not present

## 2014-05-16 DIAGNOSIS — Z79899 Other long term (current) drug therapy: Secondary | ICD-10-CM | POA: Diagnosis not present

## 2014-05-16 DIAGNOSIS — H2589 Other age-related cataract: Secondary | ICD-10-CM | POA: Diagnosis not present

## 2014-06-06 DIAGNOSIS — H2589 Other age-related cataract: Secondary | ICD-10-CM | POA: Diagnosis not present

## 2014-06-13 DIAGNOSIS — H2589 Other age-related cataract: Secondary | ICD-10-CM | POA: Diagnosis not present

## 2014-06-13 DIAGNOSIS — Z961 Presence of intraocular lens: Secondary | ICD-10-CM | POA: Diagnosis not present

## 2014-07-12 DIAGNOSIS — H353290 Exudative age-related macular degeneration, unspecified eye, stage unspecified: Secondary | ICD-10-CM | POA: Diagnosis not present

## 2014-07-19 DIAGNOSIS — M159 Polyosteoarthritis, unspecified: Secondary | ICD-10-CM | POA: Diagnosis not present

## 2014-07-19 DIAGNOSIS — I10 Essential (primary) hypertension: Secondary | ICD-10-CM | POA: Diagnosis not present

## 2014-07-19 DIAGNOSIS — B351 Tinea unguium: Secondary | ICD-10-CM | POA: Diagnosis not present

## 2014-07-19 DIAGNOSIS — IMO0001 Reserved for inherently not codable concepts without codable children: Secondary | ICD-10-CM | POA: Diagnosis not present

## 2014-08-29 DIAGNOSIS — H3532 Exudative age-related macular degeneration: Secondary | ICD-10-CM | POA: Diagnosis not present

## 2014-08-29 DIAGNOSIS — H353211 Exudative age-related macular degeneration, right eye, with active choroidal neovascularization: Secondary | ICD-10-CM | POA: Insufficient documentation

## 2014-08-29 DIAGNOSIS — Z961 Presence of intraocular lens: Secondary | ICD-10-CM | POA: Diagnosis not present

## 2014-08-29 DIAGNOSIS — H3531 Nonexudative age-related macular degeneration: Secondary | ICD-10-CM | POA: Diagnosis not present

## 2014-09-13 DIAGNOSIS — E780 Pure hypercholesterolemia: Secondary | ICD-10-CM | POA: Diagnosis not present

## 2014-09-13 DIAGNOSIS — E119 Type 2 diabetes mellitus without complications: Secondary | ICD-10-CM | POA: Diagnosis not present

## 2014-09-13 DIAGNOSIS — I10 Essential (primary) hypertension: Secondary | ICD-10-CM | POA: Diagnosis not present

## 2014-09-26 ENCOUNTER — Ambulatory Visit: Payer: Medicare Other | Admitting: Neurology

## 2014-09-26 DIAGNOSIS — H3532 Exudative age-related macular degeneration: Secondary | ICD-10-CM | POA: Diagnosis not present

## 2014-09-26 DIAGNOSIS — Z961 Presence of intraocular lens: Secondary | ICD-10-CM | POA: Diagnosis not present

## 2014-09-26 DIAGNOSIS — H3531 Nonexudative age-related macular degeneration: Secondary | ICD-10-CM | POA: Diagnosis not present

## 2014-10-11 ENCOUNTER — Ambulatory Visit (INDEPENDENT_AMBULATORY_CARE_PROVIDER_SITE_OTHER): Payer: Medicare Other | Admitting: Neurology

## 2014-10-11 ENCOUNTER — Encounter: Payer: Self-pay | Admitting: Neurology

## 2014-10-11 VITALS — BP 148/75 | HR 68 | Ht 65.5 in | Wt 185.0 lb

## 2014-10-11 DIAGNOSIS — R251 Tremor, unspecified: Secondary | ICD-10-CM | POA: Diagnosis not present

## 2014-10-11 DIAGNOSIS — I10 Essential (primary) hypertension: Secondary | ICD-10-CM | POA: Insufficient documentation

## 2014-10-11 NOTE — Progress Notes (Signed)
PATIENT: Anne Roach DOB: 11/18/1932  HISTORICAL  Anne Roach is a 80 years old female, immigrant from [place], accompanied by her daughter, referred by her primary care physician Dr. Jeanann Lewandowsky for evaluation of jaw tremor  She had a past medical history of diabetes, with mild elevated A1c in the past, 7.9, hypertension  I have saw her previously in February 2014 for electrodiagnostic study, which has demonstrate mild axonal peripheral neuropathy, most consistent with her history of diabetes, she continued to complain bilateral feet paresthesia, low back pain, getting worse with prolonged standing, walking, no bowel and bladder incontinence.  Over past 1 year, since 2015, she noticed intermittent jaw shaking, no dysarthria, no dysphasia, no asymmetry of her face, no bilateral hands tremor, there was no similar disease in her family.  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 amLODipine (NORVASC) 10 MG tablet Take 10 mg by mouth daily.    Marland Kitchen glimepiride (AMARYL) 2 MG tablet Take 2 mg by mouth daily before breakfast.    . Omega-3 Fatty Acids 166.7 MG CAPS Take 1 capsule by mouth daily. Omega 7 fatty acids.     No current facility-administered medications on file prior to visit.    PAST MEDICAL HISTORY: Past Medical History  Diagnosis Date  . HTN (hypertension)   . DM (diabetes mellitus)   . Tremor     PAST SURGICAL HISTORY: Past Surgical History  Procedure Laterality Date  . Cataract extraction Bilateral   . Hernia repair    . Appendectomy    . Carpal  tunnel release      FAMILY HISTORY: Family History  Problem Relation Age of Onset  . Diabetes Father   . Asthma Brother     SOCIAL HISTORY:  History   Social History  . Marital Status: Widowed    Spouse Name: N/A    Number of Children: 3  . Years of Education: 12   Occupational History    Retired   Social History Main Topics  . Smoking status: Never Smoker   . Smokeless tobacco: Never Used  . Alcohol Use: No  . Drug Use: No  . Sexual Activity: Not on file   Other Topics Concern  . Not on file   Social History Narrative   Speaks English and Arabic. Patient lives at home with her daughter Anne Roach). Patient is widowed.   Patient is retired.   Education high school.   Right handed.   Two children.   Caffeine None     PHYSICAL EXAM   Filed Vitals:   10/11/14 0842  BP: 148/75  Pulse: 68  Height: 5' 5.5" (1.664 m)  Weight: 185 lb (83.915 kg)    Not recorded      Body mass index is 30.31 kg/(m^2).   Generalized: In no acute distress  Neck: Supple, no carotid bruits   Cardiac: Regular rate rhythm  Pulmonary: Clear to auscultation bilaterally  Musculoskeletal: No deformity  Neurological examination  Mentation: Alert oriented to time, place, history taking, and causual conversation, MMSE 29/30, she missed date.  Cranial nerve  II-XII: Pupils were equal round reactive to light. Extraocular movements were full.  Visual field were full on confrontational test. Bilateral fundi were sharp.  Facial sensation and strength were normal. Hearing was intact to finger rubbing bilaterally. Uvula tongue midline.  Head turning and shoulder shrug and were normal and symmetric.Tongue protrusion into cheek strength was normal.  Motor: Normal tone, bulk and strength.  Sensory:Length dependent decreased  fine touch, pinprick to distal shin, absent toe vibratory sensation.  Coordination: Normal finger to nose, heel-to-shin bilaterally there was no truncal  ataxia o Gait: Rising up from seated position without assistance, normal stance, without trunk ataxia, moderate stride, good arm swing, smooth turning Romberg signs: Negative  Deep tendon reflexes: Brachioradialis 2/2, biceps 2/2, triceps 2/2, patellar 2/2, Achilles  trace, plantar responses were flexor bilaterally.   DIAGNOSTIC DATA (LABS, IMAGING, TESTING) - I reviewed patient records, labs, notes, testing and imaging myself where available.  Lab Results  Component Value Date   WBC 9.0 06/12/2011   HGB 14.1 06/12/2011   HCT 42.6 06/12/2011   MCV 90.4 06/12/2011   PLT 180 06/12/2011      Component Value Date/Time   NA 139 06/12/2011 1201   K 4.0 06/12/2011 1201   CL 102 06/12/2011 1201   CO2 27 06/12/2011 1201   GLUCOSE 254* 06/12/2011 1201   BUN 14 06/12/2011 1201   CREATININE 0.81 06/12/2011 1201   CALCIUM 9.2 06/12/2011 1201   GFRNONAA >60 06/12/2011 1201   GFRAA >60 06/12/2011 1201    ASSESSMENT AND PLAN  Anne Roach is a 80 y.o. femalewith past medical history of hypertension, diabetes, diabetic peripheral neuropathy,presenting with a year history of jaw tremor, no parkinsonian features,  1, Most consistent with a variant of essential tremor, she does not want any treatment 2. Laboratory evaluations by primary care at her next yearly follow-up, to rule out thyroid malfunction, 3. Return to clinic for new issues    Marcial Pacas M.D. Ph.D.  [facility] [address], [place], [place] [address] [phone]

## 2014-10-24 DIAGNOSIS — H3532 Exudative age-related macular degeneration: Secondary | ICD-10-CM | POA: Diagnosis not present

## 2014-10-24 DIAGNOSIS — Z961 Presence of intraocular lens: Secondary | ICD-10-CM | POA: Diagnosis not present

## 2014-10-24 DIAGNOSIS — H3531 Nonexudative age-related macular degeneration: Secondary | ICD-10-CM | POA: Diagnosis not present

## 2014-11-23 DIAGNOSIS — R251 Tremor, unspecified: Secondary | ICD-10-CM | POA: Diagnosis not present

## 2014-11-23 DIAGNOSIS — E780 Pure hypercholesterolemia: Secondary | ICD-10-CM | POA: Diagnosis not present

## 2014-11-23 DIAGNOSIS — I10 Essential (primary) hypertension: Secondary | ICD-10-CM | POA: Diagnosis not present

## 2014-11-23 DIAGNOSIS — E119 Type 2 diabetes mellitus without complications: Secondary | ICD-10-CM | POA: Diagnosis not present

## 2014-12-12 DIAGNOSIS — H3531 Nonexudative age-related macular degeneration: Secondary | ICD-10-CM | POA: Diagnosis not present

## 2014-12-12 DIAGNOSIS — H3532 Exudative age-related macular degeneration: Secondary | ICD-10-CM | POA: Diagnosis not present

## 2014-12-12 DIAGNOSIS — Z961 Presence of intraocular lens: Secondary | ICD-10-CM | POA: Diagnosis not present

## 2015-01-22 DIAGNOSIS — E119 Type 2 diabetes mellitus without complications: Secondary | ICD-10-CM | POA: Diagnosis not present

## 2015-01-22 DIAGNOSIS — R251 Tremor, unspecified: Secondary | ICD-10-CM | POA: Diagnosis not present

## 2015-01-22 DIAGNOSIS — E780 Pure hypercholesterolemia: Secondary | ICD-10-CM | POA: Diagnosis not present

## 2015-01-22 DIAGNOSIS — I10 Essential (primary) hypertension: Secondary | ICD-10-CM | POA: Diagnosis not present

## 2015-02-06 DIAGNOSIS — Z961 Presence of intraocular lens: Secondary | ICD-10-CM | POA: Diagnosis not present

## 2015-02-06 DIAGNOSIS — H3532 Exudative age-related macular degeneration: Secondary | ICD-10-CM | POA: Diagnosis not present

## 2015-03-07 DIAGNOSIS — R251 Tremor, unspecified: Secondary | ICD-10-CM | POA: Diagnosis not present

## 2015-03-07 DIAGNOSIS — E119 Type 2 diabetes mellitus without complications: Secondary | ICD-10-CM | POA: Diagnosis not present

## 2015-03-07 DIAGNOSIS — E780 Pure hypercholesterolemia: Secondary | ICD-10-CM | POA: Diagnosis not present

## 2015-03-07 DIAGNOSIS — M545 Low back pain: Secondary | ICD-10-CM | POA: Diagnosis not present

## 2015-03-07 DIAGNOSIS — I10 Essential (primary) hypertension: Secondary | ICD-10-CM | POA: Diagnosis not present

## 2015-03-12 DIAGNOSIS — I10 Essential (primary) hypertension: Secondary | ICD-10-CM | POA: Diagnosis not present

## 2015-03-12 DIAGNOSIS — E780 Pure hypercholesterolemia: Secondary | ICD-10-CM | POA: Diagnosis not present

## 2015-03-12 DIAGNOSIS — R251 Tremor, unspecified: Secondary | ICD-10-CM | POA: Diagnosis not present

## 2015-03-12 DIAGNOSIS — E119 Type 2 diabetes mellitus without complications: Secondary | ICD-10-CM | POA: Diagnosis not present

## 2015-03-21 DIAGNOSIS — H04123 Dry eye syndrome of bilateral lacrimal glands: Secondary | ICD-10-CM | POA: Diagnosis not present

## 2015-06-19 DIAGNOSIS — R251 Tremor, unspecified: Secondary | ICD-10-CM | POA: Diagnosis not present

## 2015-06-19 DIAGNOSIS — I10 Essential (primary) hypertension: Secondary | ICD-10-CM | POA: Diagnosis not present

## 2015-06-19 DIAGNOSIS — E780 Pure hypercholesterolemia: Secondary | ICD-10-CM | POA: Diagnosis not present

## 2015-06-19 DIAGNOSIS — E119 Type 2 diabetes mellitus without complications: Secondary | ICD-10-CM | POA: Diagnosis not present

## 2015-07-22 ENCOUNTER — Emergency Department (HOSPITAL_COMMUNITY)
Admission: EM | Admit: 2015-07-22 | Discharge: 2015-07-22 | Disposition: A | Discharge status: Home / Self Care | Payer: Medicare Other | Attending: Emergency Medicine | Admitting: Emergency Medicine

## 2015-07-22 ENCOUNTER — Encounter (HOSPITAL_COMMUNITY): Payer: Self-pay | Admitting: Emergency Medicine

## 2015-07-22 DIAGNOSIS — Y998 Other external cause status: Secondary | ICD-10-CM | POA: Diagnosis not present

## 2015-07-22 DIAGNOSIS — T22012A Burn of unspecified degree of left forearm, initial encounter: Secondary | ICD-10-CM | POA: Insufficient documentation

## 2015-07-22 DIAGNOSIS — T2026XA Burn of second degree of forehead and cheek, initial encounter: Secondary | ICD-10-CM | POA: Diagnosis not present

## 2015-07-22 DIAGNOSIS — Y92030 Kitchen in apartment as the place of occurrence of the external cause: Secondary | ICD-10-CM | POA: Diagnosis not present

## 2015-07-22 DIAGNOSIS — X102XXA Contact with fats and cooking oils, initial encounter: Secondary | ICD-10-CM | POA: Insufficient documentation

## 2015-07-22 DIAGNOSIS — I10 Essential (primary) hypertension: Secondary | ICD-10-CM | POA: Diagnosis not present

## 2015-07-22 DIAGNOSIS — E119 Type 2 diabetes mellitus without complications: Secondary | ICD-10-CM | POA: Diagnosis not present

## 2015-07-22 DIAGNOSIS — Z23 Encounter for immunization: Secondary | ICD-10-CM | POA: Insufficient documentation

## 2015-07-22 DIAGNOSIS — T300 Burn of unspecified body region, unspecified degree: Secondary | ICD-10-CM

## 2015-07-22 DIAGNOSIS — T2101XA Burn of unspecified degree of chest wall, initial encounter: Secondary | ICD-10-CM | POA: Insufficient documentation

## 2015-07-22 DIAGNOSIS — Y9389 Activity, other specified: Secondary | ICD-10-CM | POA: Diagnosis not present

## 2015-07-22 DIAGNOSIS — T23249A Burn of second degree of unspecified multiple fingers (nail), including thumb, initial encounter: Secondary | ICD-10-CM | POA: Diagnosis not present

## 2015-07-22 DIAGNOSIS — T22011A Burn of unspecified degree of right forearm, initial encounter: Secondary | ICD-10-CM | POA: Diagnosis not present

## 2015-07-22 DIAGNOSIS — T23261A Burn of second degree of back of right hand, initial encounter: Secondary | ICD-10-CM | POA: Diagnosis not present

## 2015-07-22 DIAGNOSIS — T310 Burns involving less than 10% of body surface: Secondary | ICD-10-CM | POA: Diagnosis not present

## 2015-07-22 MED ORDER — BACITRACIN ZINC 500 UNIT/GM EX OINT: TOPICAL_OINTMENT | Freq: Once | CUTANEOUS | Status: DC

## 2015-07-22 MED ORDER — TETANUS-DIPHTH-ACELL PERTUSSIS 5-2.5-18.5 LF-MCG/0.5 IM SUSP
0.5000 mL | Freq: Once | INTRAMUSCULAR | Status: AC
  Administered 2015-07-22: 0.5 mL via INTRAMUSCULAR
  Filled 2015-07-22: qty 0.5

## 2015-07-22 MED ORDER — ACETAMINOPHEN 500 MG PO TABS
1000.0000 mg | ORAL_TABLET | Freq: Once | ORAL | Status: AC
  Administered 2015-07-22: 1000 mg via ORAL
  Filled 2015-07-22: qty 2

## 2015-07-22 MED ORDER — SILVER SULFADIAZINE 1 % EX CREA
TOPICAL_CREAM | Freq: Once | CUTANEOUS | Status: AC
  Administered 2015-07-22: 14:00:00 via TOPICAL
  Filled 2015-07-22: qty 85

## 2015-07-22 MED ORDER — IBUPROFEN 400 MG PO TABS
400.0000 mg | ORAL_TABLET | Freq: Once | ORAL | Status: AC
  Administered 2015-07-22: 400 mg via ORAL
  Filled 2015-07-22: qty 1

## 2015-07-22 NOTE — Discharge Instructions (Signed)
Burn Care Your skin is a natural barrier to infection. It is the largest organ of your body. Burns damage this natural protection. To help prevent infection, it is very important to follow your caregiver's instructions in the care of your burn. Burns are classified as:  First degree. There is only redness of the skin (erythema). No scarring is expected.  Second degree. There is blistering of the skin. Scarring may occur with deeper burns.  Third degree. All layers of the skin are injured, and scarring is expected. HOME CARE INSTRUCTIONS   Wash your hands well before changing your bandage.  Change your bandage as often as directed by your caregiver.  Remove the old bandage. If the bandage sticks, you may soak it off with cool, clean water.  Cleanse the burn thoroughly but gently with mild soap and water.  Pat the area dry with a clean, dry cloth.  Apply a thin layer of antibacterial cream to the burn.  Apply a clean bandage as instructed by your caregiver.  Keep the bandage as clean and dry as possible.  Elevate the affected area for the first 24 hours, then as instructed by your caregiver.  Only take over-the-counter or prescription medicines for pain, discomfort, or fever as directed by your caregiver. SEEK IMMEDIATE MEDICAL CARE IF:   You develop excessive pain.  You develop redness, tenderness, swelling, or red streaks near the burn.  The burned area develops yellowish-white fluid (pus) or a bad smell.  You have a fever. MAKE SURE YOU:   Understand these instructions.  Will watch your condition.  Will get help right away if you are not doing well or get worse. Document Released: 11/03/2005 Document Revised: 01/26/2012 Document Reviewed: 03/26/2011 ExitCare Patient Information 2015 [employer]. This information is not intended to replace advice given to you by your health care provider. Make sure you discuss any questions you have with your health care  provider.  
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
## 2016-01-09 DIAGNOSIS — H01014 Ulcerative blepharitis left upper eyelid: Secondary | ICD-10-CM | POA: Diagnosis not present

## 2016-01-28 DIAGNOSIS — E119 Type 2 diabetes mellitus without complications: Secondary | ICD-10-CM | POA: Diagnosis not present

## 2016-01-28 DIAGNOSIS — E7800 Pure hypercholesterolemia, unspecified: Secondary | ICD-10-CM | POA: Diagnosis not present

## 2016-01-28 DIAGNOSIS — E109 Type 1 diabetes mellitus without complications: Secondary | ICD-10-CM | POA: Diagnosis not present

## 2016-01-28 DIAGNOSIS — I10 Essential (primary) hypertension: Secondary | ICD-10-CM | POA: Diagnosis not present

## 2016-01-28 DIAGNOSIS — R251 Tremor, unspecified: Secondary | ICD-10-CM | POA: Diagnosis not present

## 2016-02-06 DIAGNOSIS — H01014 Ulcerative blepharitis left upper eyelid: Secondary | ICD-10-CM | POA: Diagnosis not present

## 2016-02-06 DIAGNOSIS — M25571 Pain in right ankle and joints of right foot: Secondary | ICD-10-CM | POA: Diagnosis not present

## 2016-02-06 DIAGNOSIS — H01011 Ulcerative blepharitis right upper eyelid: Secondary | ICD-10-CM | POA: Diagnosis not present

## 2016-02-13 ENCOUNTER — Ambulatory Visit (INDEPENDENT_AMBULATORY_CARE_PROVIDER_SITE_OTHER): Payer: Medicare Other | Admitting: Podiatry

## 2016-02-13 ENCOUNTER — Encounter: Payer: Self-pay | Admitting: Podiatry

## 2016-02-13 ENCOUNTER — Ambulatory Visit (INDEPENDENT_AMBULATORY_CARE_PROVIDER_SITE_OTHER): Payer: Medicare Other

## 2016-02-13 VITALS — BP 144/73 | HR 66 | Resp 16 | Ht 65.0 in | Wt 179.0 lb

## 2016-02-13 DIAGNOSIS — L84 Corns and callosities: Secondary | ICD-10-CM

## 2016-02-13 DIAGNOSIS — M79674 Pain in right toe(s): Secondary | ICD-10-CM

## 2016-02-13 DIAGNOSIS — D169 Benign neoplasm of bone and articular cartilage, unspecified: Secondary | ICD-10-CM | POA: Diagnosis not present

## 2016-02-13 NOTE — Progress Notes (Signed)
Subjective:     Patient ID: Anne Roach, female   DOB: 09-07-1933, 82 y.o.   MRN: [id number]  HPI patient presents with daughter with painful lesion on the inside of the fifth toe right foot that makes it hard to wear shoe gear and is been present for at least one month   Review of Systems  All other systems reviewed and are negative.      Objective:   Physical Exam  Constitutional: She is oriented to person, place, and time.  Cardiovascular: Intact distal pulses.   Musculoskeletal: Normal range of motion.  Neurological: She is oriented to person, place, and time.  Skin: Skin is warm.  Nursing note and vitals reviewed.  neurovascular status intact muscle strength adequate range of motion within normal limits with patient found to have a painful keratotic lesion on the inside of the fifth toe right that is localized in nature with no proximal edema erythema or drainage noted. Patient has good digital perfusion and is well oriented 3 with mild swelling in the ankle but negative Homans sign     Assessment:     Exostosis medial aspect digit 5 right with pain    Plan:     H&P and x-ray reviewed with patient. I did deep debridement of lesion after numbing the toe and applied padding and instructed if it returns soon that were going to need to consider exostectomy procedure which I educated him on today. Reappoint as needed  X-ray report indicated small osteochondral spur on the inside of the right fifth digit right

## 2016-02-13 NOTE — Progress Notes (Signed)
   Subjective:    Patient ID: Anne Roach, female    DOB: 04/28/1933, 82 y.o.   MRN: [id number]  HPI Patient presents with a corn on their right foot; 5th toe-medial; x1 month   Review of Systems  HENT: Positive for hearing loss.   Musculoskeletal: Positive for gait problem.  Neurological: Positive for numbness.  All other systems reviewed and are negative.      Objective:   Physical Exam        Assessment & Plan:

## 2016-02-25 ENCOUNTER — Ambulatory Visit: Payer: Medicare Other

## 2016-02-25 ENCOUNTER — Ambulatory Visit (INDEPENDENT_AMBULATORY_CARE_PROVIDER_SITE_OTHER): Payer: Medicare Other | Admitting: Podiatry

## 2016-02-25 ENCOUNTER — Ambulatory Visit (INDEPENDENT_AMBULATORY_CARE_PROVIDER_SITE_OTHER): Payer: Medicare Other

## 2016-02-25 ENCOUNTER — Encounter: Payer: Self-pay | Admitting: Podiatry

## 2016-02-25 DIAGNOSIS — M79609 Pain in unspecified limb: Secondary | ICD-10-CM | POA: Diagnosis not present

## 2016-02-25 DIAGNOSIS — M79672 Pain in left foot: Secondary | ICD-10-CM

## 2016-02-25 DIAGNOSIS — R600 Localized edema: Secondary | ICD-10-CM | POA: Diagnosis not present

## 2016-02-25 DIAGNOSIS — M1000 Idiopathic gout, unspecified site: Secondary | ICD-10-CM

## 2016-02-25 DIAGNOSIS — R609 Edema, unspecified: Secondary | ICD-10-CM | POA: Diagnosis not present

## 2016-02-25 DIAGNOSIS — M779 Enthesopathy, unspecified: Secondary | ICD-10-CM | POA: Diagnosis not present

## 2016-02-25 LAB — URIC ACID: URIC ACID, SERUM: 7.5 mg/dL — AB (ref 2.4–7.0)

## 2016-02-25 LAB — RHEUMATOID FACTOR

## 2016-02-25 LAB — C-REACTIVE PROTEIN: CRP: 3.5 mg/dL — ABNORMAL HIGH (ref <0.60)

## 2016-02-26 LAB — ANA, IFA COMPREHENSIVE PANEL
ANA: NEGATIVE
ENA SM Ab Ser-aCnc: <1
SCLERODERMA (SCL-70) (ENA) ANTIBODY, IGG: NEGATIVE
SM/RNP: <1
SSA (Ro) (ENA) Antibody, IgG: <1
SSB (La) (ENA) Antibody, IgG: <1

## 2016-02-26 LAB — SEDIMENTATION RATE: Sed Rate: 47 mm/hr — ABNORMAL HIGH (ref 0–30)

## 2016-02-26 NOTE — Progress Notes (Signed)
Subjective:     Patient ID: Anne Roach, female   DOB: 08/20/1933, 82 y.o.   MRN: [id number]  HPI patient presents with caregiver stating that foot started swelling about a week ago and the big toe joint is very tender in the hole forefoot get sore and she's been having trouble walking a lot on it area and no other health problems acute noted   Review of Systems     Objective:   Physical Exam Neurovascular status intact muscle strength adequate with patient found to have edematous forefoot left with exquisite discomfort around the first MPJ left and negative Homans sign noted. Patient has edema and redness around the first MPJ and does not have pathology above the ankle    Assessment:     Possibility for acute gout with inflammatory capsulitis and forefoot manifestations    Plan:     H&P and x-rays and condition reviewed with patient and family. I went ahead today and I did a careful injection around the forefoot left 3 Milligan Kenalog 5 mg Xylocaine around the first MPJ and then applied Unna boot to reduce the swelling and advised on elevation and dispensed surgical shoe. She will take this off 3 days or earlier if needed and be seen back by [place] in 2 weeks and I did order arthritic profile blood work  Negative for signs of fracture with some obvious soft tissue edema and no indications of acute arthritic condition

## 2016-03-03 ENCOUNTER — Encounter: Payer: Self-pay | Admitting: Podiatry

## 2016-03-03 ENCOUNTER — Ambulatory Visit (INDEPENDENT_AMBULATORY_CARE_PROVIDER_SITE_OTHER): Payer: Medicare Other | Admitting: Podiatry

## 2016-03-03 DIAGNOSIS — M1000 Idiopathic gout, unspecified site: Secondary | ICD-10-CM

## 2016-03-03 DIAGNOSIS — M779 Enthesopathy, unspecified: Secondary | ICD-10-CM | POA: Diagnosis not present

## 2016-03-05 NOTE — Progress Notes (Signed)
Subjective:     Patient ID: Anne Roach, female   DOB: 1933/10/03, 82 y.o.   MRN: [id number]  HPI patient states my ankle is doing a lot better with reduced swelling and pain   Review of Systems     Objective:   Physical Exam Neurovascular status intact muscle strength adequate with reduced edema in the left foot secondary to previous injury    Assessment:     Chronic ankle irritation edema that is still present but improved from previous visit    Plan:     Reviewed compression elevation and supportive shoe and do not recommend any more aggressive treatment at the current time. Reappoint if symptoms persist

## 2016-03-11 DIAGNOSIS — H353211 Exudative age-related macular degeneration, right eye, with active choroidal neovascularization: Secondary | ICD-10-CM | POA: Diagnosis not present

## 2016-03-11 DIAGNOSIS — H353122 Nonexudative age-related macular degeneration, left eye, intermediate dry stage: Secondary | ICD-10-CM | POA: Diagnosis not present

## 2016-04-08 DIAGNOSIS — H353211 Exudative age-related macular degeneration, right eye, with active choroidal neovascularization: Secondary | ICD-10-CM | POA: Diagnosis not present

## 2016-04-08 DIAGNOSIS — H353122 Nonexudative age-related macular degeneration, left eye, intermediate dry stage: Secondary | ICD-10-CM | POA: Diagnosis not present

## 2016-05-13 DIAGNOSIS — Z961 Presence of intraocular lens: Secondary | ICD-10-CM | POA: Diagnosis not present

## 2016-05-13 DIAGNOSIS — E119 Type 2 diabetes mellitus without complications: Secondary | ICD-10-CM | POA: Diagnosis not present

## 2016-06-03 DIAGNOSIS — H02831 Dermatochalasis of right upper eyelid: Secondary | ICD-10-CM | POA: Diagnosis not present

## 2016-06-03 DIAGNOSIS — E119 Type 2 diabetes mellitus without complications: Secondary | ICD-10-CM | POA: Diagnosis not present

## 2016-06-03 DIAGNOSIS — H02403 Unspecified ptosis of bilateral eyelids: Secondary | ICD-10-CM | POA: Diagnosis not present

## 2016-06-03 DIAGNOSIS — I10 Essential (primary) hypertension: Secondary | ICD-10-CM | POA: Diagnosis not present

## 2016-06-03 DIAGNOSIS — H02834 Dermatochalasis of left upper eyelid: Secondary | ICD-10-CM | POA: Diagnosis not present

## 2016-06-03 DIAGNOSIS — H353211 Exudative age-related macular degeneration, right eye, with active choroidal neovascularization: Secondary | ICD-10-CM | POA: Diagnosis not present

## 2016-06-03 DIAGNOSIS — L908 Other atrophic disorders of skin: Secondary | ICD-10-CM | POA: Diagnosis not present

## 2016-06-03 DIAGNOSIS — H353122 Nonexudative age-related macular degeneration, left eye, intermediate dry stage: Secondary | ICD-10-CM | POA: Diagnosis not present

## 2016-06-03 DIAGNOSIS — H9190 Unspecified hearing loss, unspecified ear: Secondary | ICD-10-CM | POA: Diagnosis not present

## 2016-06-03 DIAGNOSIS — Z961 Presence of intraocular lens: Secondary | ICD-10-CM | POA: Diagnosis not present

## 2016-06-03 DIAGNOSIS — H02423 Myogenic ptosis of bilateral eyelids: Secondary | ICD-10-CM | POA: Diagnosis not present

## 2016-06-10 DIAGNOSIS — H353211 Exudative age-related macular degeneration, right eye, with active choroidal neovascularization: Secondary | ICD-10-CM | POA: Diagnosis not present

## 2016-06-10 DIAGNOSIS — Z961 Presence of intraocular lens: Secondary | ICD-10-CM | POA: Diagnosis not present

## 2016-06-10 DIAGNOSIS — H353122 Nonexudative age-related macular degeneration, left eye, intermediate dry stage: Secondary | ICD-10-CM | POA: Diagnosis not present

## 2016-06-24 DIAGNOSIS — H02423 Myogenic ptosis of bilateral eyelids: Secondary | ICD-10-CM | POA: Insufficient documentation

## 2016-07-09 DIAGNOSIS — Z01818 Encounter for other preprocedural examination: Secondary | ICD-10-CM | POA: Diagnosis not present

## 2016-07-09 DIAGNOSIS — I10 Essential (primary) hypertension: Secondary | ICD-10-CM | POA: Diagnosis not present

## 2016-08-26 DIAGNOSIS — E119 Type 2 diabetes mellitus without complications: Secondary | ICD-10-CM | POA: Diagnosis not present

## 2016-08-26 DIAGNOSIS — I10 Essential (primary) hypertension: Secondary | ICD-10-CM | POA: Diagnosis not present

## 2016-08-26 DIAGNOSIS — R109 Unspecified abdominal pain: Secondary | ICD-10-CM | POA: Diagnosis not present

## 2016-08-26 DIAGNOSIS — E7800 Pure hypercholesterolemia, unspecified: Secondary | ICD-10-CM | POA: Diagnosis not present

## 2016-08-26 DIAGNOSIS — R251 Tremor, unspecified: Secondary | ICD-10-CM | POA: Diagnosis not present

## 2016-09-01 ENCOUNTER — Ambulatory Visit (INDEPENDENT_AMBULATORY_CARE_PROVIDER_SITE_OTHER): Payer: Medicare Other

## 2016-09-01 ENCOUNTER — Ambulatory Visit (INDEPENDENT_AMBULATORY_CARE_PROVIDER_SITE_OTHER): Payer: Medicare Other | Admitting: Podiatry

## 2016-09-01 ENCOUNTER — Other Ambulatory Visit: Payer: Self-pay | Admitting: Podiatry

## 2016-09-01 ENCOUNTER — Encounter: Payer: Self-pay | Admitting: Podiatry

## 2016-09-01 VITALS — BP 122/72 | HR 64 | Resp 16

## 2016-09-01 DIAGNOSIS — M79672 Pain in left foot: Secondary | ICD-10-CM | POA: Diagnosis not present

## 2016-09-01 DIAGNOSIS — M779 Enthesopathy, unspecified: Secondary | ICD-10-CM

## 2016-09-01 DIAGNOSIS — M7752 Other enthesopathy of left foot: Secondary | ICD-10-CM

## 2016-09-01 DIAGNOSIS — M778 Other enthesopathies, not elsewhere classified: Secondary | ICD-10-CM

## 2016-09-01 DIAGNOSIS — M109 Gout, unspecified: Secondary | ICD-10-CM

## 2016-09-01 LAB — URIC ACID: Uric Acid: 9.4 mg/dL — ABNORMAL HIGH (ref 2.5–7.1)

## 2016-09-01 MED ORDER — BETAMETHASONE SOD PHOS & ACET 6 (3-3) MG/ML IJ SUSP: 3.0000 mg | Freq: Once | INTRAMUSCULAR | Status: DC

## 2016-09-01 MED ORDER — COLCHICINE 0.6 MG PO TABS: 0.6000 mg | ORAL_TABLET | Freq: Every day | ORAL | 0 refills | Status: DC

## 2016-09-01 NOTE — Patient Instructions (Signed)

## 2016-09-01 NOTE — Progress Notes (Signed)
Subjective:  Patient presents today for pain and tenderness to the left foot. Patient states that she does have a history of gout in her last acute gouty attack encounter was on 03/03/2016. Patient presents today for further treatment and evaluation. Patient has been symptomatic with a red hot swollen left foot bunion area for approximately one weeks now. Patient denies trauma    Objective/Physical Exam General: The patient is alert and oriented x3 in no acute distress.  Dermatology: Skin is warm, dry and supple bilateral lower extremities. Negative for open lesions or macerations.  Vascular: Palpable pedal pulses bilaterally. No edema or erythema noted. Capillary refill within normal limits.  Neurological: Epicritic and protective threshold grossly intact bilaterally.   Musculoskeletal Exam: Pain on palpation and range of motion of the first MPJ left foot. Around the first MPJ is mild edema with erythema noted. Range of motion within normal limits to all pedal and ankle joints bilateral. Muscle strength 5/5 in all groups bilateral.   Radiographic Exam:  Normal osseous mineralization. Joint spaces preserved. No fracture/dislocation/boney destruction.    Assessment: #1 acute gout attack first MPJ left foot 1 week #2 pain in left foot   Plan of Care:  #1 Patient was evaluated. #2 today uric acid test was ordered #3 injection of 0.5 mL Celestone Soluspan injected with the first MPJ of the left foot. #4 prescription for colchicine 0.6 mg was dispensed #5 patient is to return to clinic when necessary   Dr. Edrick Kins, [place]

## 2016-09-03 DIAGNOSIS — H903 Sensorineural hearing loss, bilateral: Secondary | ICD-10-CM | POA: Diagnosis not present

## 2016-09-03 DIAGNOSIS — H908 Mixed conductive and sensorineural hearing loss, unspecified: Secondary | ICD-10-CM | POA: Diagnosis not present

## 2016-09-03 DIAGNOSIS — H8143 Vertigo of central origin, bilateral: Secondary | ICD-10-CM | POA: Diagnosis not present

## 2016-10-29 DIAGNOSIS — I10 Essential (primary) hypertension: Secondary | ICD-10-CM | POA: Diagnosis not present

## 2016-10-29 DIAGNOSIS — R251 Tremor, unspecified: Secondary | ICD-10-CM | POA: Diagnosis not present

## 2016-10-29 DIAGNOSIS — E119 Type 2 diabetes mellitus without complications: Secondary | ICD-10-CM | POA: Diagnosis not present

## 2016-10-29 DIAGNOSIS — E7800 Pure hypercholesterolemia, unspecified: Secondary | ICD-10-CM | POA: Diagnosis not present

## 2016-10-30 DIAGNOSIS — H353211 Exudative age-related macular degeneration, right eye, with active choroidal neovascularization: Secondary | ICD-10-CM | POA: Diagnosis not present

## 2016-10-31 DIAGNOSIS — Z79899 Other long term (current) drug therapy: Secondary | ICD-10-CM | POA: Diagnosis not present

## 2016-10-31 DIAGNOSIS — H3561 Retinal hemorrhage, right eye: Secondary | ICD-10-CM | POA: Diagnosis not present

## 2016-10-31 DIAGNOSIS — Z9842 Cataract extraction status, left eye: Secondary | ICD-10-CM | POA: Diagnosis not present

## 2016-10-31 DIAGNOSIS — Z7984 Long term (current) use of oral hypoglycemic drugs: Secondary | ICD-10-CM | POA: Diagnosis not present

## 2016-10-31 DIAGNOSIS — H353122 Nonexudative age-related macular degeneration, left eye, intermediate dry stage: Secondary | ICD-10-CM | POA: Diagnosis not present

## 2016-10-31 DIAGNOSIS — I10 Essential (primary) hypertension: Secondary | ICD-10-CM | POA: Diagnosis not present

## 2016-10-31 DIAGNOSIS — H35362 Drusen (degenerative) of macula, left eye: Secondary | ICD-10-CM | POA: Diagnosis not present

## 2016-10-31 DIAGNOSIS — E11319 Type 2 diabetes mellitus with unspecified diabetic retinopathy without macular edema: Secondary | ICD-10-CM | POA: Diagnosis not present

## 2016-10-31 DIAGNOSIS — Z961 Presence of intraocular lens: Secondary | ICD-10-CM | POA: Diagnosis not present

## 2016-10-31 DIAGNOSIS — Z9841 Cataract extraction status, right eye: Secondary | ICD-10-CM | POA: Diagnosis not present

## 2016-10-31 DIAGNOSIS — H353211 Exudative age-related macular degeneration, right eye, with active choroidal neovascularization: Secondary | ICD-10-CM | POA: Diagnosis not present

## 2016-11-12 ENCOUNTER — Telehealth: Payer: Self-pay | Admitting: *Deleted

## 2016-11-12 MED ORDER — ALLOPURINOL 100 MG PO TABS: 100.0000 mg | ORAL_TABLET | Freq: Every day | ORAL | 2 refills | Status: DC

## 2016-11-12 NOTE — Telephone Encounter (Addendum)
-----   Message from Edrick Kins, DPM sent at 11/12/2016 11:16 AM EST ----- Regarding: Please contact patient Please contact patient regarding her elevated uric acid levels. Please prescribe her Allopurinol 100mg  QD #90 w/ 2 refills.   Thanks, Dr. Amalia Hailey. Informed pt of Dr. Amalia Hailey orders, and that the rx would be sent to [facility].

## 2016-12-08 DIAGNOSIS — Z7984 Long term (current) use of oral hypoglycemic drugs: Secondary | ICD-10-CM | POA: Diagnosis not present

## 2016-12-08 DIAGNOSIS — G4733 Obstructive sleep apnea (adult) (pediatric): Secondary | ICD-10-CM | POA: Diagnosis not present

## 2016-12-08 DIAGNOSIS — H02833 Dermatochalasis of right eye, unspecified eyelid: Secondary | ICD-10-CM | POA: Diagnosis not present

## 2016-12-08 DIAGNOSIS — H35362 Drusen (degenerative) of macula, left eye: Secondary | ICD-10-CM | POA: Diagnosis not present

## 2016-12-08 DIAGNOSIS — Z9841 Cataract extraction status, right eye: Secondary | ICD-10-CM | POA: Diagnosis not present

## 2016-12-08 DIAGNOSIS — Z79899 Other long term (current) drug therapy: Secondary | ICD-10-CM | POA: Diagnosis not present

## 2016-12-08 DIAGNOSIS — H353122 Nonexudative age-related macular degeneration, left eye, intermediate dry stage: Secondary | ICD-10-CM | POA: Diagnosis not present

## 2016-12-08 DIAGNOSIS — H353211 Exudative age-related macular degeneration, right eye, with active choroidal neovascularization: Secondary | ICD-10-CM | POA: Diagnosis not present

## 2016-12-08 DIAGNOSIS — Z961 Presence of intraocular lens: Secondary | ICD-10-CM | POA: Diagnosis not present

## 2016-12-08 DIAGNOSIS — H02836 Dermatochalasis of left eye, unspecified eyelid: Secondary | ICD-10-CM | POA: Diagnosis not present

## 2016-12-08 DIAGNOSIS — E119 Type 2 diabetes mellitus without complications: Secondary | ICD-10-CM | POA: Diagnosis not present

## 2016-12-08 DIAGNOSIS — Z9842 Cataract extraction status, left eye: Secondary | ICD-10-CM | POA: Diagnosis not present

## 2016-12-08 DIAGNOSIS — I10 Essential (primary) hypertension: Secondary | ICD-10-CM | POA: Diagnosis not present

## 2017-01-07 ENCOUNTER — Emergency Department (HOSPITAL_COMMUNITY)
Admission: EM | Admit: 2017-01-07 | Discharge: 2017-01-07 | Disposition: A | Discharge status: Home / Self Care | Payer: Medicare Other | Attending: Emergency Medicine | Admitting: Emergency Medicine

## 2017-01-07 ENCOUNTER — Encounter (HOSPITAL_COMMUNITY): Payer: Self-pay | Admitting: Emergency Medicine

## 2017-01-07 DIAGNOSIS — R531 Weakness: Secondary | ICD-10-CM | POA: Insufficient documentation

## 2017-01-07 DIAGNOSIS — R031 Nonspecific low blood-pressure reading: Secondary | ICD-10-CM | POA: Diagnosis not present

## 2017-01-07 DIAGNOSIS — I10 Essential (primary) hypertension: Secondary | ICD-10-CM | POA: Insufficient documentation

## 2017-01-07 DIAGNOSIS — E119 Type 2 diabetes mellitus without complications: Secondary | ICD-10-CM | POA: Insufficient documentation

## 2017-01-07 DIAGNOSIS — Z7984 Long term (current) use of oral hypoglycemic drugs: Secondary | ICD-10-CM | POA: Insufficient documentation

## 2017-01-07 DIAGNOSIS — R000 Tachycardia, unspecified: Secondary | ICD-10-CM | POA: Diagnosis not present

## 2017-01-07 DIAGNOSIS — E7800 Pure hypercholesterolemia, unspecified: Secondary | ICD-10-CM | POA: Diagnosis not present

## 2017-01-07 LAB — CBC
HEMATOCRIT: 37.3 % (ref 36.0–46.0)
HEMOGLOBIN: 12.2 g/dL (ref 12.0–15.0)
MCH: 29.8 pg (ref 26.0–34.0)
MCHC: 32.7 g/dL (ref 30.0–36.0)
MCV: 91 fL (ref 78.0–100.0)
Platelets: 205 10*3/uL (ref 150–400)
RBC: 4.1 MIL/uL (ref 3.87–5.11)
RDW: 13.9 % (ref 11.5–15.5)
WBC: 7.8 10*3/uL (ref 4.0–10.5)

## 2017-01-07 LAB — URINALYSIS, ROUTINE W REFLEX MICROSCOPIC
BILIRUBIN URINE: NEGATIVE
Glucose, UA: NEGATIVE mg/dL
HGB URINE DIPSTICK: NEGATIVE
KETONES UR: NEGATIVE mg/dL
NITRITE: NEGATIVE
PH: 5 (ref 5.0–8.0)
Protein, ur: NEGATIVE mg/dL
SPECIFIC GRAVITY, URINE: 1.013 (ref 1.005–1.030)

## 2017-01-07 LAB — BASIC METABOLIC PANEL
ANION GAP: 13 (ref 5–15)
BUN: 13 mg/dL (ref 6–20)
CALCIUM: 8.9 mg/dL (ref 8.9–10.3)
CO2: 21 mmol/L — AB (ref 22–32)
Chloride: 106 mmol/L (ref 101–111)
Creatinine, Ser: 1.03 mg/dL — ABNORMAL HIGH (ref 0.44–1.00)
GFR calc non Af Amer: 50 mL/min — ABNORMAL LOW (ref >60)
GFR, EST AFRICAN AMERICAN: 57 mL/min — AB (ref >60)
GLUCOSE: 174 mg/dL — AB (ref 65–99)
POTASSIUM: 3.7 mmol/L (ref 3.5–5.1)
Sodium: 140 mmol/L (ref 135–145)

## 2017-01-07 MED ORDER — OXYCODONE-ACETAMINOPHEN 5-325 MG PO TABS: 1.0000 | ORAL_TABLET | Freq: Once | ORAL | Status: DC

## 2017-01-07 NOTE — Discharge Instructions (Signed)
The fluids have helped with your blood pressure.  Increase your fluid intake and rest as much as possible.  Follow-up with your primary care doctor.  Return here for any worsening in your condition

## 2017-01-07 NOTE — ED Notes (Signed)
Pt tolerated ortho VS well, denies dizziness or increased weakness with standing.

## 2017-01-07 NOTE — ED Notes (Signed)
Got patient into a gown and on the monitor did kg

## 2017-01-07 NOTE — ED Provider Notes (Signed)
Patient is an 83 year old female, she was in her otherwise usual state of health until she started to feel some lightheadedness today. It is unclear exactly what happened but the patient denies shortness of breath, chest pain, headache, she did feel kind generally weak. A family member was alerted that she was feeling weak and brought her to the hospital. The patient has denied palpitations, denies chest pain shortness of breath fever nausea vomiting diarrhea and has had a normal appetite. There was a question as to whether this was near-syncope. Her exam is unremarkable except for some orthostatic change. Labs were unremarkable, EKG was unremarkable. The patient is very stable appearing.   EKG Interpretation  Date/Time:  Wednesday January 07 2017 10:12:04 EST Ventricular Rate:  67 PR Interval:    QRS Duration: 104 QT Interval:  413 QTC Calculation: 436 R Axis:   -14 Text Interpretation:  Sinus rhythm Prolonged PR interval Consider anterior infarct since last tracing no significant change Confirmed by Kaliq Lege  MD, Allanna Bresee ([id number]) on 01/07/2017 10:53:22 AM       Medical screening examination/treatment/procedure(s) were conducted as a shared visit with non-physician practitioner(s) and myself.  I personally evaluated the patient during the encounter.  Clinical Impression:   Final diagnoses:  Weakness          Noemi Chapel, MD 01/08/17 [phone]

## 2017-01-07 NOTE — ED Triage Notes (Signed)
Pt arrives from PCP via [facility] reporting weakness since Monday.  EMS reports pt hypotensive at PCP 86/56, slin clammy, reports giving 200 mL NS bolus, total of 600 mL NS.  BP on arrival 131/64.  Pt denies focal deficit, LOC, SOB, CP. Pt reports diarrhea Monday.  Pt AOx4, resp e/u on arrival.

## 2017-01-07 NOTE — ED Notes (Signed)
ED Provider at bedside. 

## 2017-01-09 LAB — URINE CULTURE: Culture: <10000 — AB

## 2017-01-14 ENCOUNTER — Ambulatory Visit (INDEPENDENT_AMBULATORY_CARE_PROVIDER_SITE_OTHER): Payer: Medicare Other | Admitting: Podiatry

## 2017-01-14 DIAGNOSIS — M7752 Other enthesopathy of left foot: Secondary | ICD-10-CM | POA: Diagnosis not present

## 2017-01-14 DIAGNOSIS — Z79899 Other long term (current) drug therapy: Secondary | ICD-10-CM | POA: Diagnosis not present

## 2017-01-14 DIAGNOSIS — H353211 Exudative age-related macular degeneration, right eye, with active choroidal neovascularization: Secondary | ICD-10-CM | POA: Diagnosis not present

## 2017-01-14 DIAGNOSIS — I10 Essential (primary) hypertension: Secondary | ICD-10-CM | POA: Diagnosis not present

## 2017-01-14 DIAGNOSIS — Z7984 Long term (current) use of oral hypoglycemic drugs: Secondary | ICD-10-CM | POA: Diagnosis not present

## 2017-01-14 DIAGNOSIS — M779 Enthesopathy, unspecified: Secondary | ICD-10-CM

## 2017-01-14 DIAGNOSIS — M7751 Other enthesopathy of right foot: Secondary | ICD-10-CM

## 2017-01-14 DIAGNOSIS — M109 Gout, unspecified: Secondary | ICD-10-CM

## 2017-01-14 DIAGNOSIS — M778 Other enthesopathies, not elsewhere classified: Secondary | ICD-10-CM

## 2017-01-14 DIAGNOSIS — H353122 Nonexudative age-related macular degeneration, left eye, intermediate dry stage: Secondary | ICD-10-CM | POA: Diagnosis not present

## 2017-01-14 DIAGNOSIS — E119 Type 2 diabetes mellitus without complications: Secondary | ICD-10-CM | POA: Diagnosis not present

## 2017-01-14 DIAGNOSIS — H31011 Macula scars of posterior pole (postinflammatory) (post-traumatic), right eye: Secondary | ICD-10-CM | POA: Diagnosis not present

## 2017-01-14 DIAGNOSIS — H35362 Drusen (degenerative) of macula, left eye: Secondary | ICD-10-CM | POA: Diagnosis not present

## 2017-01-14 DIAGNOSIS — Z8669 Personal history of other diseases of the nervous system and sense organs: Secondary | ICD-10-CM | POA: Diagnosis not present

## 2017-01-14 DIAGNOSIS — Z961 Presence of intraocular lens: Secondary | ICD-10-CM | POA: Diagnosis not present

## 2017-01-14 DIAGNOSIS — Z9841 Cataract extraction status, right eye: Secondary | ICD-10-CM | POA: Diagnosis not present

## 2017-01-14 DIAGNOSIS — Z9842 Cataract extraction status, left eye: Secondary | ICD-10-CM | POA: Diagnosis not present

## 2017-01-14 MED ORDER — COLCHICINE 0.6 MG PO TABS: 0.6000 mg | ORAL_TABLET | Freq: Every day | ORAL | 0 refills | Status: DC

## 2017-01-14 MED ORDER — ALLOPURINOL 100 MG PO TABS: 100.0000 mg | ORAL_TABLET | Freq: Every day | ORAL | 6 refills | Status: DC

## 2017-01-17 MED ORDER — BETAMETHASONE SOD PHOS & ACET 6 (3-3) MG/ML IJ SUSP: 3.0000 mg | Freq: Once | INTRAMUSCULAR | Status: DC

## 2017-01-17 NOTE — Progress Notes (Signed)
Subjective:  Patient presents today for a recurrent gout flareup which started yesterday 01/13/2017. Patient has had multiple acute gout attacks during 2017. Uric acid levels on 09/01/2016 were 9.4 mg/dL. Patient presents today for further treatment and evaluation    Objective/Physical Exam General: The patient is alert and oriented x3 in no acute distress.  Dermatology: Skin is warm, dry and supple bilateral lower extremities. Negative for open lesions or macerations.  Vascular: Palpable pedal pulses bilaterally. No edema or erythema noted. Capillary refill within normal limits.  Neurological: Epicritic and protective threshold grossly intact bilaterally.   Musculoskeletal Exam: Pain on palpation and range of motion of the first MPJ left foot. Around the first MPJ is mild edema with erythema noted. Range of motion within normal limits to all pedal and ankle joints bilateral. Muscle strength 5/5 in all groups bilateral.   Radiographic Exam:  Normal osseous mineralization. Joint spaces preserved. No fracture/dislocation/boney destruction.    Assessment: #1 acute gout attack first MPJ left foot 1 week #2 pain in left foot   Plan of Care:  #1 Patient was evaluated. #2 today uric acid test was ordered #3 injection of 0.5 mL Celestone Soluspan injected with the first MPJ of the left foot. #4 prescription for colchicine 0.6 mg was dispensed #5 prescription for allopurinol #6 return to clinic in 4 weeks   Dr. Edrick Kins, [place]

## 2017-01-17 NOTE — ED Provider Notes (Signed)
[facility] DEPT Provider Note   CSN: [id number] Arrival date & time: 01/07/17  1003     History   Chief Complaint Chief Complaint  Patient presents with  . Weakness    HPI Anne Roach is a 83 y.o. female.  HPI Patient presents to the emergency department with generalized weakness that started earlier today.  The patient states that she had a feeling of general weakness.  She states that significant condition better or worse.  She states that he has not been eating or drinking very well lately.  Patient states that she did not feel like she was going to pass out.The patient denies chest pain, shortness of breath, headache,blurred vision, neck pain, fever, cough, weakness, numbness, dizziness, anorexia, edema, abdominal pain, nausea, vomiting, diarrhea, rash, back pain, dysuria, hematemesis, bloody stool, near syncope, or syncope. Past Medical History:  Diagnosis Date  . DM (diabetes mellitus) ([place])   . HTN (hypertension)   . Tremor     Patient Active Problem List   Diagnosis Date Noted  . HTN (hypertension)   . Tremor   . OSA (obstructive sleep apnea) 06/29/2012  . DM (diabetes mellitus) ([place])   . HYPERTENSION 03/10/2009  . LUNG NODULE 03/10/2009  . SHORTNESS OF BREATH 03/10/2009  . CHEST PAIN 03/10/2009    Past Surgical History:  Procedure Laterality Date  . APPENDECTOMY    . CARPAL TUNNEL RELEASE    . CATARACT EXTRACTION Bilateral   . HERNIA REPAIR      OB History    No data available       Home Medications    Prior to Admission medications   Medication Sig Start Date End Date Taking? Authorizing Provider  allopurinol (ZYLOPRIM) 100 MG tablet Take 1 tablet (100 mg total) by mouth daily. 01/14/17   Edrick Kins, DPM  amLODipine (NORVASC) 5 MG tablet Take 5 mg by mouth daily. 06/21/15   Historical Provider, MD  colchicine 0.6 MG tablet Take 1 tablet (0.6 mg total) by mouth daily. 01/14/17   Edrick Kins, DPM  irbesartan-hydrochlorothiazide (AVALIDE)  150-12.5 MG per tablet Take 1 tablet by mouth daily.    Historical Provider, MD  memantine (NAMENDA) 10 MG tablet Take 10 mg by mouth daily.  08/17/14   Historical Provider, MD  metFORMIN (GLUCOPHAGE-XR) 750 MG 24 hr tablet Take 750 mg by mouth daily after supper. 05/14/15   Historical Provider, MD  Multiple Vitamins-Minerals (ICAPS AREDS 2 PO) Take 1 tablet by mouth daily.    Historical Provider, MD  Omega 3-6-9 Fatty Acids (TRIPLE OMEGA COMPLEX PO) Take 1 tablet by mouth daily.    Historical Provider, MD    Family History Family History  Problem Relation Age of Onset  . Diabetes Father   . Asthma Brother     Social History Social History  Substance Use Topics  . Smoking status: Never Smoker  . Smokeless tobacco: Never Used  . Alcohol use No     Allergies   Patient has no known allergies.   Review of Systems Review of Systems  All other systems negative except as documented in the HPI. All pertinent positives and negatives as reviewed in the HPI. Physical Exam Updated Vital Signs BP 147/68 (BP Location: Right Arm)   Pulse 63   Temp 98.4 F (36.9 C) (Oral)   Resp 18   Ht 5\' 6"  (1.676 m)   Wt 81.2 kg   SpO2 96%   BMI 28.89 kg/m   Physical Exam  Constitutional:  She is oriented to person, place, and time. She appears well-developed and well-nourished. No distress.  HENT:  Head: Normocephalic and atraumatic.  Mouth/Throat: Oropharynx is clear and moist.  Eyes: Pupils are equal, round, and reactive to light.  Neck: Normal range of motion. Neck supple.  Cardiovascular: Normal rate, regular rhythm and normal heart sounds.  Exam reveals no gallop and no friction rub.   No murmur heard. Pulmonary/Chest: Effort normal and breath sounds normal. No respiratory distress. She has no wheezes.  Abdominal: Soft. Bowel sounds are normal. She exhibits no distension. There is no tenderness.  Neurological: She is alert and oriented to person, place, and time. No sensory deficit. She  exhibits normal muscle tone. Coordination normal.  Skin: Skin is warm and dry. No rash noted. No erythema.  Psychiatric: She has a normal mood and affect. Her behavior is normal.  Nursing note and vitals reviewed.    ED Treatments / Results  Labs (all labs ordered are listed, but only abnormal results are displayed) Labs Reviewed  URINE CULTURE - Abnormal; Notable for the following:       Result Value   Culture <10,000 COLONIES/mL INSIGNIFICANT GROWTH (*)    All other components within normal limits  BASIC METABOLIC PANEL - Abnormal; Notable for the following:    CO2 21 (*)    Glucose, Bld 174 (*)    Creatinine, Ser 1.03 (*)    GFR calc non Af Amer 50 (*)    GFR calc Af Amer 57 (*)    All other components within normal limits  URINALYSIS, ROUTINE W REFLEX MICROSCOPIC - Abnormal; Notable for the following:    APPearance HAZY (*)    Leukocytes, UA LARGE (*)    Bacteria, UA FEW (*)    Squamous Epithelial / LPF 0-5 (*)    Non Squamous Epithelial 0-5 (*)    All other components within normal limits  CBC  CBG MONITORING, ED    EKG  EKG Interpretation  Date/Time:  Wednesday January 07 2017 10:12:04 EST Ventricular Rate:  67 PR Interval:    QRS Duration: 104 QT Interval:  413 QTC Calculation: 436 R Axis:   -14 Text Interpretation:  Sinus rhythm Prolonged PR interval Consider anterior infarct since last tracing no significant change Confirmed by MILLER  MD, BRIAN ([id number]) on 01/07/2017 10:53:22 AM       Radiology No results found.  Procedures Procedures (including critical care time)  Medications Ordered in ED Medications - No data to display   Initial Impression / Assessment and Plan / ED Course  I have reviewed the triage vital signs and the nursing notes.  Pertinent labs & imaging results that were available during my care of the patient were reviewed by me and considered in my medical decision making (see chart for details).     Patient has improved  following IV fluids.  She will be discharged home and advised follow-up with her primary care Dr. told to return here as needed.  Patient agrees the plan and all questions were answered  Final Clinical Impressions(s) / ED Diagnoses   Final diagnoses:  Weakness    New Prescriptions Discharge Medication List as of 01/07/2017  3:17 PM       Dalia Heading, PA-C 01/17/17 1602    Noemi Chapel, MD 01/18/17 0700

## 2017-01-21 DIAGNOSIS — Z9842 Cataract extraction status, left eye: Secondary | ICD-10-CM | POA: Diagnosis not present

## 2017-01-21 DIAGNOSIS — H353211 Exudative age-related macular degeneration, right eye, with active choroidal neovascularization: Secondary | ICD-10-CM | POA: Diagnosis not present

## 2017-01-21 DIAGNOSIS — Z79899 Other long term (current) drug therapy: Secondary | ICD-10-CM | POA: Diagnosis not present

## 2017-01-21 DIAGNOSIS — H353230 Exudative age-related macular degeneration, bilateral, stage unspecified: Secondary | ICD-10-CM | POA: Diagnosis not present

## 2017-01-21 DIAGNOSIS — E119 Type 2 diabetes mellitus without complications: Secondary | ICD-10-CM | POA: Diagnosis not present

## 2017-01-21 DIAGNOSIS — N390 Urinary tract infection, site not specified: Secondary | ICD-10-CM | POA: Diagnosis not present

## 2017-01-21 DIAGNOSIS — H353122 Nonexudative age-related macular degeneration, left eye, intermediate dry stage: Secondary | ICD-10-CM | POA: Diagnosis not present

## 2017-01-21 DIAGNOSIS — R251 Tremor, unspecified: Secondary | ICD-10-CM | POA: Diagnosis not present

## 2017-01-21 DIAGNOSIS — I10 Essential (primary) hypertension: Secondary | ICD-10-CM | POA: Diagnosis not present

## 2017-01-21 DIAGNOSIS — Z961 Presence of intraocular lens: Secondary | ICD-10-CM | POA: Diagnosis not present

## 2017-01-21 DIAGNOSIS — Z7984 Long term (current) use of oral hypoglycemic drugs: Secondary | ICD-10-CM | POA: Diagnosis not present

## 2017-01-21 DIAGNOSIS — Z9841 Cataract extraction status, right eye: Secondary | ICD-10-CM | POA: Diagnosis not present

## 2017-01-21 DIAGNOSIS — H43813 Vitreous degeneration, bilateral: Secondary | ICD-10-CM | POA: Diagnosis not present

## 2017-02-11 ENCOUNTER — Ambulatory Visit (INDEPENDENT_AMBULATORY_CARE_PROVIDER_SITE_OTHER): Payer: Medicare Other | Admitting: Podiatry

## 2017-02-11 DIAGNOSIS — M109 Gout, unspecified: Secondary | ICD-10-CM | POA: Diagnosis not present

## 2017-02-11 MED ORDER — TRAMADOL HCL 50 MG PO TABS: 50.0000 mg | ORAL_TABLET | Freq: Four times a day (QID) | ORAL | 0 refills | Status: DC | PRN

## 2017-02-14 NOTE — Progress Notes (Signed)
Subjective:  Patient presents today for follow-up evaluation of an acute gout attack to the first MPJ left foot. Patient states that she's doing much better. She only has minimal pain.   Objective/Physical Exam General: The patient is alert and oriented x3 in no acute distress.  Dermatology: Skin is warm, dry and supple bilateral lower extremities. Negative for open lesions or macerations.  Vascular: Palpable pedal pulses bilaterally. No edema or erythema noted. Capillary refill within normal limits.  Neurological: Epicritic and protective threshold grossly intact bilaterally.   Musculoskeletal Exam: Moderate Pain on palpation and range of motion of the first MPJ left foot. Around the first MPJ is mild edema with erythema noted. Range of motion within normal limits to all pedal and ankle joints bilateral. Muscle strength 5/5 in all groups bilateral.    Assessment: #1 gout first MPJ left foot #2 pain in left foot   Plan of Care:  #1 Patient was evaluated. #2 prescription for tramadol provided #3 continue allopurinol as prescribed #4 return to clinic when necessary  Edrick Kins, DPM [facility]  Dr. Edrick Kins, [place]                                        [place], [place] [address]                Office [phone]  Fax [phone]

## 2017-02-25 DIAGNOSIS — E119 Type 2 diabetes mellitus without complications: Secondary | ICD-10-CM | POA: Diagnosis not present

## 2017-02-25 DIAGNOSIS — H353122 Nonexudative age-related macular degeneration, left eye, intermediate dry stage: Secondary | ICD-10-CM | POA: Diagnosis not present

## 2017-02-25 DIAGNOSIS — H35721 Serous detachment of retinal pigment epithelium, right eye: Secondary | ICD-10-CM | POA: Diagnosis not present

## 2017-02-25 DIAGNOSIS — Z961 Presence of intraocular lens: Secondary | ICD-10-CM | POA: Diagnosis not present

## 2017-02-25 DIAGNOSIS — H353211 Exudative age-related macular degeneration, right eye, with active choroidal neovascularization: Secondary | ICD-10-CM | POA: Diagnosis not present

## 2017-03-19 ENCOUNTER — Encounter (HOSPITAL_COMMUNITY): Payer: Self-pay | Admitting: Emergency Medicine

## 2017-03-19 ENCOUNTER — Inpatient Hospital Stay (HOSPITAL_COMMUNITY)
Admission: EM | Admit: 2017-03-19 | Discharge: 2017-03-21 | DRG: 392 | Disposition: A | Discharge status: Home / Self Care | Payer: Medicare HMO | Attending: Internal Medicine | Admitting: Internal Medicine

## 2017-03-19 ENCOUNTER — Emergency Department (HOSPITAL_COMMUNITY): Payer: Medicare HMO

## 2017-03-19 DIAGNOSIS — E1122 Type 2 diabetes mellitus with diabetic chronic kidney disease: Secondary | ICD-10-CM | POA: Diagnosis present

## 2017-03-19 DIAGNOSIS — R079 Chest pain, unspecified: Secondary | ICD-10-CM | POA: Diagnosis not present

## 2017-03-19 DIAGNOSIS — M7742 Metatarsalgia, left foot: Secondary | ICD-10-CM | POA: Diagnosis not present

## 2017-03-19 DIAGNOSIS — M778 Other enthesopathies, not elsewhere classified: Secondary | ICD-10-CM

## 2017-03-19 DIAGNOSIS — M7751 Other enthesopathy of right foot: Secondary | ICD-10-CM

## 2017-03-19 DIAGNOSIS — R251 Tremor, unspecified: Secondary | ICD-10-CM | POA: Diagnosis not present

## 2017-03-19 DIAGNOSIS — I10 Essential (primary) hypertension: Secondary | ICD-10-CM | POA: Diagnosis not present

## 2017-03-19 DIAGNOSIS — R109 Unspecified abdominal pain: Secondary | ICD-10-CM | POA: Diagnosis not present

## 2017-03-19 DIAGNOSIS — M109 Gout, unspecified: Secondary | ICD-10-CM | POA: Diagnosis not present

## 2017-03-19 DIAGNOSIS — G4733 Obstructive sleep apnea (adult) (pediatric): Secondary | ICD-10-CM | POA: Diagnosis present

## 2017-03-19 DIAGNOSIS — Z79899 Other long term (current) drug therapy: Secondary | ICD-10-CM

## 2017-03-19 DIAGNOSIS — N183 Chronic kidney disease, stage 3 (moderate): Secondary | ICD-10-CM | POA: Diagnosis present

## 2017-03-19 DIAGNOSIS — M779 Enthesopathy, unspecified: Secondary | ICD-10-CM | POA: Diagnosis present

## 2017-03-19 DIAGNOSIS — I129 Hypertensive chronic kidney disease with stage 1 through stage 4 chronic kidney disease, or unspecified chronic kidney disease: Secondary | ICD-10-CM | POA: Diagnosis not present

## 2017-03-19 DIAGNOSIS — I959 Hypotension, unspecified: Secondary | ICD-10-CM | POA: Diagnosis present

## 2017-03-19 DIAGNOSIS — I4891 Unspecified atrial fibrillation: Secondary | ICD-10-CM | POA: Clinically undetermined

## 2017-03-19 DIAGNOSIS — M7741 Metatarsalgia, right foot: Secondary | ICD-10-CM | POA: Diagnosis not present

## 2017-03-19 DIAGNOSIS — E119 Type 2 diabetes mellitus without complications: Secondary | ICD-10-CM | POA: Diagnosis not present

## 2017-03-19 DIAGNOSIS — Z7984 Long term (current) use of oral hypoglycemic drugs: Secondary | ICD-10-CM

## 2017-03-19 DIAGNOSIS — I340 Nonrheumatic mitral (valve) insufficiency: Secondary | ICD-10-CM | POA: Diagnosis not present

## 2017-03-19 DIAGNOSIS — Z833 Family history of diabetes mellitus: Secondary | ICD-10-CM | POA: Diagnosis not present

## 2017-03-19 DIAGNOSIS — E1142 Type 2 diabetes mellitus with diabetic polyneuropathy: Secondary | ICD-10-CM | POA: Diagnosis not present

## 2017-03-19 DIAGNOSIS — R197 Diarrhea, unspecified: Secondary | ICD-10-CM | POA: Diagnosis not present

## 2017-03-19 DIAGNOSIS — M7752 Other enthesopathy of left foot: Secondary | ICD-10-CM

## 2017-03-19 DIAGNOSIS — K529 Noninfective gastroenteritis and colitis, unspecified: Secondary | ICD-10-CM | POA: Diagnosis not present

## 2017-03-19 MED ORDER — SODIUM CHLORIDE 0.9 % IV BOLUS (SEPSIS)
1000.0000 mL | Freq: Once | INTRAVENOUS | Status: AC
  Administered 2017-03-20: 1000 mL via INTRAVENOUS

## 2017-03-19 NOTE — ED Triage Notes (Signed)
Pt brought to ED by [facility] from home for c/o CP, diarrhea and Afib with RVR at 160, pt is arabic speaker c/o right and left cp 7/10 with SOB. Family gave 324 mg ASA prior to EMS arrival, BP 96/60 for EMS.

## 2017-03-19 NOTE — ED Provider Notes (Signed)
[facility] [facility] DEPT Provider Note   CSN: [id number] Arrival date & time: 03/19/17  2309     History   Chief Complaint Chief Complaint  Patient presents with  . Chest Pain  . Diarrhea    HPI Anne Roach is a 83 y.o. female.  Patient is an 83 year old female with past mental history of diabetes and hypertension. She presents today for evaluation of diarrhea, weakness, and abdominal pain. This started this morning and is worsening. Her diarrhea has been nonbloody and non-melanotic. She denies any fevers or chills. The pain in her abdomen is across the upper abdomen. It is crampy in nature. She denies any ill contacts. She denies any chest pain or palpitations.    Weakness  Primary symptoms comment: generalized weakness. This is a new problem. Episode onset: this morning. The problem has been gradually worsening. There has been no fever. Pertinent negatives include no shortness of breath, no chest pain and no vomiting.    Past Medical History:  Diagnosis Date  . DM (diabetes mellitus) ([place])   . HTN (hypertension)   . Tremor     Patient Active Problem List   Diagnosis Date Noted  . HTN (hypertension)   . Tremor   . OSA (obstructive sleep apnea) 06/29/2012  . DM (diabetes mellitus) ([place])   . HYPERTENSION 03/10/2009  . LUNG NODULE 03/10/2009  . SHORTNESS OF BREATH 03/10/2009  . CHEST PAIN 03/10/2009    Past Surgical History:  Procedure Laterality Date  . APPENDECTOMY    . CARPAL TUNNEL RELEASE    . CATARACT EXTRACTION Bilateral   . HERNIA REPAIR      OB History    No data available       Home Medications    Prior to Admission medications   Medication Sig Start Date End Date Taking? Authorizing Provider  allopurinol (ZYLOPRIM) 100 MG tablet Take 1 tablet (100 mg total) by mouth daily. 01/14/17   Edrick Kins, DPM  amLODipine (NORVASC) 5 MG tablet Take 5 mg by mouth daily. 06/21/15   Historical Provider, MD  colchicine 0.6 MG tablet Take 1 tablet (0.6 mg  total) by mouth daily. 01/14/17   Edrick Kins, DPM  irbesartan-hydrochlorothiazide (AVALIDE) 150-12.5 MG per tablet Take 1 tablet by mouth daily.    Historical Provider, MD  memantine (NAMENDA) 10 MG tablet Take 10 mg by mouth daily.  08/17/14   Historical Provider, MD  metFORMIN (GLUCOPHAGE-XR) 750 MG 24 hr tablet Take 750 mg by mouth daily after supper. 05/14/15   Historical Provider, MD  Multiple Vitamins-Minerals (ICAPS AREDS 2 PO) Take 1 tablet by mouth daily.    Historical Provider, MD  Omega 3-6-9 Fatty Acids (TRIPLE OMEGA COMPLEX PO) Take 1 tablet by mouth daily.    Historical Provider, MD  traMADol (ULTRAM) 50 MG tablet Take 1 tablet (50 mg total) by mouth every 6 (six) hours as needed. 02/11/17   Edrick Kins, DPM    Family History Family History  Problem Relation Age of Onset  . Diabetes Father   . Asthma Brother     Social History Social History  Substance Use Topics  . Smoking status: Never Smoker  . Smokeless tobacco: Never Used  . Alcohol use No     Allergies   Patient has no known allergies.   Review of Systems Review of Systems  Respiratory: Negative for shortness of breath.   Cardiovascular: Negative for chest pain.  Gastrointestinal: Negative for vomiting.  Neurological: Positive for weakness.  All other systems reviewed and are negative.    Physical Exam Updated Vital Signs BP 109/68   Pulse 96   Temp 98.6 F (37 C) (Oral)   Resp 12   Ht 5\' 4"  (1.626 m)   Wt 179 lb (81.2 kg)   SpO2 94%   BMI 30.73 kg/m   Physical Exam  Constitutional: She is oriented to person, place, and time. She appears well-developed and well-nourished. No distress.  HENT:  Head: Normocephalic and atraumatic.  Mouth/Throat: Oropharynx is clear and moist.  Eyes: EOM are normal. Pupils are equal, round, and reactive to light.  Neck: Normal range of motion. Neck supple.  Cardiovascular: Normal rate and regular rhythm.  Exam reveals no gallop and no friction rub.   No  murmur heard. Pulmonary/Chest: Effort normal and breath sounds normal. No respiratory distress. She has no wheezes.  Abdominal: Soft. Bowel sounds are normal. She exhibits no distension. There is tenderness. There is no rebound and no guarding.  There is tenderness to palpation across the upper abdomen and the right upper quadrant, epigastric region, and left upper quadrant.  Musculoskeletal: Normal range of motion.  Neurological: She is alert and oriented to person, place, and time.  Skin: Skin is warm and dry. She is not diaphoretic. There is pallor.  Nursing note and vitals reviewed.    ED Treatments / Results  Labs (all labs ordered are listed, but only abnormal results are displayed) Labs Reviewed  CBC  COMPREHENSIVE METABOLIC PANEL  LIPASE, BLOOD  URINALYSIS, ROUTINE W REFLEX MICROSCOPIC  I-STAT TROPOININ, ED    EKG  EKG Interpretation  Date/Time:  Thursday Mar 19 2017 23:13:56 EDT Ventricular Rate:  114 PR Interval:    QRS Duration: 89 QT Interval:  387 QTC Calculation: 477 R Axis:   -30 Text Interpretation:  Atrial fibrillation with rapid ventricular response Left axis deviation Probable anterior infarct, age indeterminate Baseline wander in lead(s) II III aVF Confirmed by Kashaun Bebo  MD, Pearlene Teat ([id number]) on 03/19/2017 11:38:19 PM       Radiology No results found.  Procedures Procedures (including critical care time)  Medications Ordered in ED Medications  sodium chloride 0.9 % bolus 1,000 mL (not administered)     Initial Impression / Assessment and Plan / ED Course  I have reviewed the triage vital signs and the nursing notes.  Pertinent labs & imaging results that were available during my care of the patient were reviewed by me and considered in my medical decision making (see chart for details).  Patient presents here with complaints of abdominal pain, diarrhea, weakness for the past 2 days. Her workup reveals colitis on her CT scan, and he is also been found  to be in atrial fibrillation which is new for her. I've discussed these findings with Dr. Hal Hope who agrees to admit the patient for hydration and further workup.  Final Clinical Impressions(s) / ED Diagnoses   Final diagnoses:  None    New Prescriptions New Prescriptions   No medications on file     Veryl Speak, MD 03/20/17 [phone]

## 2017-03-20 ENCOUNTER — Inpatient Hospital Stay (HOSPITAL_COMMUNITY): Payer: Medicare HMO

## 2017-03-20 ENCOUNTER — Emergency Department (HOSPITAL_COMMUNITY): Payer: Medicare HMO

## 2017-03-20 ENCOUNTER — Encounter (HOSPITAL_COMMUNITY): Payer: Self-pay | Admitting: Radiology

## 2017-03-20 DIAGNOSIS — K529 Noninfective gastroenteritis and colitis, unspecified: Secondary | ICD-10-CM | POA: Diagnosis present

## 2017-03-20 DIAGNOSIS — M779 Enthesopathy, unspecified: Secondary | ICD-10-CM | POA: Diagnosis present

## 2017-03-20 DIAGNOSIS — I10 Essential (primary) hypertension: Secondary | ICD-10-CM | POA: Diagnosis not present

## 2017-03-20 DIAGNOSIS — I340 Nonrheumatic mitral (valve) insufficiency: Secondary | ICD-10-CM

## 2017-03-20 DIAGNOSIS — E1142 Type 2 diabetes mellitus with diabetic polyneuropathy: Secondary | ICD-10-CM | POA: Diagnosis present

## 2017-03-20 DIAGNOSIS — E1122 Type 2 diabetes mellitus with diabetic chronic kidney disease: Secondary | ICD-10-CM | POA: Diagnosis present

## 2017-03-20 DIAGNOSIS — Z833 Family history of diabetes mellitus: Secondary | ICD-10-CM | POA: Diagnosis not present

## 2017-03-20 DIAGNOSIS — Z7984 Long term (current) use of oral hypoglycemic drugs: Secondary | ICD-10-CM | POA: Diagnosis not present

## 2017-03-20 DIAGNOSIS — G4733 Obstructive sleep apnea (adult) (pediatric): Secondary | ICD-10-CM | POA: Diagnosis present

## 2017-03-20 DIAGNOSIS — I4891 Unspecified atrial fibrillation: Secondary | ICD-10-CM

## 2017-03-20 DIAGNOSIS — M109 Gout, unspecified: Secondary | ICD-10-CM | POA: Diagnosis present

## 2017-03-20 DIAGNOSIS — I959 Hypotension, unspecified: Secondary | ICD-10-CM | POA: Diagnosis present

## 2017-03-20 DIAGNOSIS — I129 Hypertensive chronic kidney disease with stage 1 through stage 4 chronic kidney disease, or unspecified chronic kidney disease: Secondary | ICD-10-CM | POA: Diagnosis present

## 2017-03-20 DIAGNOSIS — N183 Chronic kidney disease, stage 3 (moderate): Secondary | ICD-10-CM | POA: Diagnosis present

## 2017-03-20 DIAGNOSIS — R251 Tremor, unspecified: Secondary | ICD-10-CM | POA: Diagnosis present

## 2017-03-20 DIAGNOSIS — E119 Type 2 diabetes mellitus without complications: Secondary | ICD-10-CM | POA: Diagnosis not present

## 2017-03-20 DIAGNOSIS — Z79899 Other long term (current) drug therapy: Secondary | ICD-10-CM | POA: Diagnosis not present

## 2017-03-20 HISTORY — DX: Unspecified atrial fibrillation: I48.91

## 2017-03-20 LAB — COMPREHENSIVE METABOLIC PANEL
ALK PHOS: 30 U/L — AB (ref 38–126)
ALT: 17 U/L (ref 14–54)
ALT: 20 U/L (ref 14–54)
ANION GAP: 11 (ref 5–15)
ANION GAP: 12 (ref 5–15)
AST: 22 U/L (ref 15–41)
AST: 26 U/L (ref 15–41)
Albumin: 2.9 g/dL — ABNORMAL LOW (ref 3.5–5.0)
Albumin: 3.2 g/dL — ABNORMAL LOW (ref 3.5–5.0)
Alkaline Phosphatase: 35 U/L — ABNORMAL LOW (ref 38–126)
BILIRUBIN TOTAL: 0.5 mg/dL (ref 0.3–1.2)
BILIRUBIN TOTAL: 0.6 mg/dL (ref 0.3–1.2)
BUN: 14 mg/dL (ref 6–20)
BUN: 16 mg/dL (ref 6–20)
CALCIUM: 8.1 mg/dL — AB (ref 8.9–10.3)
CHLORIDE: 101 mmol/L (ref 101–111)
CO2: 23 mmol/L (ref 22–32)
CO2: 24 mmol/L (ref 22–32)
Calcium: 8.7 mg/dL — ABNORMAL LOW (ref 8.9–10.3)
Chloride: 104 mmol/L (ref 101–111)
Creatinine, Ser: 1.1 mg/dL — ABNORMAL HIGH (ref 0.44–1.00)
Creatinine, Ser: 1.27 mg/dL — ABNORMAL HIGH (ref 0.44–1.00)
GFR calc Af Amer: 45 mL/min — ABNORMAL LOW (ref >60)
GFR calc non Af Amer: 46 mL/min — ABNORMAL LOW (ref >60)
GFR, EST AFRICAN AMERICAN: 53 mL/min — AB (ref >60)
GFR, EST NON AFRICAN AMERICAN: 38 mL/min — AB (ref >60)
GLUCOSE: 149 mg/dL — AB (ref 65–99)
Glucose, Bld: 122 mg/dL — ABNORMAL HIGH (ref 65–99)
POTASSIUM: 3.6 mmol/L (ref 3.5–5.1)
Potassium: 3.5 mmol/L (ref 3.5–5.1)
Sodium: 136 mmol/L (ref 135–145)
Sodium: 139 mmol/L (ref 135–145)
TOTAL PROTEIN: 6 g/dL — AB (ref 6.5–8.1)
TOTAL PROTEIN: 6.5 g/dL (ref 6.5–8.1)

## 2017-03-20 LAB — CBC WITH DIFFERENTIAL/PLATELET
BASOS ABS: 0 10*3/uL (ref 0.0–0.1)
BASOS PCT: 0 %
Eosinophils Absolute: 0 10*3/uL (ref 0.0–0.7)
Eosinophils Relative: 0 %
HEMATOCRIT: 35.2 % — AB (ref 36.0–46.0)
Hemoglobin: 11.8 g/dL — ABNORMAL LOW (ref 12.0–15.0)
Lymphocytes Relative: 37 %
Lymphs Abs: 2.4 10*3/uL (ref 0.7–4.0)
MCH: 30.6 pg (ref 26.0–34.0)
MCHC: 33.5 g/dL (ref 30.0–36.0)
MCV: 91.4 fL (ref 78.0–100.0)
MONO ABS: 0.5 10*3/uL (ref 0.1–1.0)
Monocytes Relative: 8 %
NEUTROS ABS: 3.7 10*3/uL (ref 1.7–7.7)
NEUTROS PCT: 55 %
Platelets: 149 10*3/uL — ABNORMAL LOW (ref 150–400)
RBC: 3.85 MIL/uL — AB (ref 3.87–5.11)
RDW: 14.3 % (ref 11.5–15.5)
WBC: 6.6 10*3/uL (ref 4.0–10.5)

## 2017-03-20 LAB — TROPONIN I: Troponin I: <0.03 ng/mL (ref <0.03)

## 2017-03-20 LAB — ECHOCARDIOGRAM COMPLETE
CHL CUP REG VEL DIAS: 78.7 cm/s
CHL CUP RV SYS PRESS: 16 mmHg
FS: 36 % (ref 28–44)
HEIGHTINCHES: 64 in
IV/PV OW: 1.17
LA diam index: 2.61 cm/m2
LA vol A4C: 63.7 ml
LA vol index: 37.2 mL/m2
LASIZE: 48 mm
LAVOL: 68.4 mL
LEFT ATRIUM END SYS DIAM: 48 mm
LVOT VTI: 16.9 cm
LVOT area: 3.14 cm2
LVOT peak vel: 90.7 cm/s
LVOTD: 20 mm
LVOTSV: 53 mL
Lateral S' vel: 11.1 cm/s
P 1/2 time: 379 ms
PW: 11.2 mm — AB (ref 0.6–1.1)
Reg peak vel: 182 cm/s
TAPSE: 15.2 mm
TR max vel: 182 cm/s
WEIGHTICAEL: 2772.5 [oz_av]

## 2017-03-20 LAB — CBC
HCT: 37.5 % (ref 36.0–46.0)
Hemoglobin: 12.4 g/dL (ref 12.0–15.0)
MCH: 30.2 pg (ref 26.0–34.0)
MCHC: 33.1 g/dL (ref 30.0–36.0)
MCV: 91.2 fL (ref 78.0–100.0)
PLATELETS: 186 10*3/uL (ref 150–400)
RBC: 4.11 MIL/uL (ref 3.87–5.11)
RDW: 13.8 % (ref 11.5–15.5)
WBC: 8.4 10*3/uL (ref 4.0–10.5)

## 2017-03-20 LAB — GLUCOSE, CAPILLARY
GLUCOSE-CAPILLARY: 137 mg/dL — AB (ref 65–99)
GLUCOSE-CAPILLARY: 147 mg/dL — AB (ref 65–99)
Glucose-Capillary: 152 mg/dL — ABNORMAL HIGH (ref 65–99)
Glucose-Capillary: 173 mg/dL — ABNORMAL HIGH (ref 65–99)

## 2017-03-20 LAB — URINALYSIS, ROUTINE W REFLEX MICROSCOPIC
BACTERIA UA: NONE SEEN
BILIRUBIN URINE: NEGATIVE
Glucose, UA: NEGATIVE mg/dL
HGB URINE DIPSTICK: NEGATIVE
Ketones, ur: NEGATIVE mg/dL
NITRITE: NEGATIVE
PROTEIN: NEGATIVE mg/dL
Specific Gravity, Urine: 1.01 (ref 1.005–1.030)
pH: 6 (ref 5.0–8.0)

## 2017-03-20 LAB — C DIFFICILE QUICK SCREEN W PCR REFLEX
C DIFFICILE (CDIFF) TOXIN: NEGATIVE
C Diff antigen: NEGATIVE
C Diff interpretation: NOT DETECTED

## 2017-03-20 LAB — LIPASE, BLOOD: Lipase: 22 U/L (ref 11–51)

## 2017-03-20 LAB — I-STAT TROPONIN, ED: TROPONIN I, POC: 0.01 ng/mL (ref 0.00–0.08)

## 2017-03-20 LAB — MAGNESIUM: MAGNESIUM: 1.6 mg/dL — AB (ref 1.7–2.4)

## 2017-03-20 LAB — HEPARIN LEVEL (UNFRACTIONATED): Heparin Unfractionated: <0.1 IU/mL — ABNORMAL LOW (ref 0.30–0.70)

## 2017-03-20 LAB — MRSA PCR SCREENING: MRSA by PCR: NEGATIVE

## 2017-03-20 LAB — LACTIC ACID, PLASMA
LACTIC ACID, VENOUS: 2.1 mmol/L — AB (ref 0.5–1.9)
LACTIC ACID, VENOUS: 2.6 mmol/L — AB (ref 0.5–1.9)
LACTIC ACID, VENOUS: 2.5 mmol/L — AB (ref 0.5–1.9)

## 2017-03-20 LAB — TSH: TSH: 2.591 u[IU]/mL (ref 0.350–4.500)

## 2017-03-20 MED ORDER — MEMANTINE HCL 10 MG PO TABS
10.0000 mg | ORAL_TABLET | Freq: Every day | ORAL | Status: DC
  Administered 2017-03-20 – 2017-03-21 (×2): 10 mg via ORAL
  Filled 2017-03-20 (×2): qty 1

## 2017-03-20 MED ORDER — ONDANSETRON HCL 4 MG PO TABS: 4.0000 mg | ORAL_TABLET | Freq: Four times a day (QID) | ORAL | Status: DC | PRN

## 2017-03-20 MED ORDER — IOPAMIDOL (ISOVUE-300) INJECTION 61%
100.0000 mL | Freq: Once | INTRAVENOUS | Status: AC | PRN
  Administered 2017-03-20: 75 mL via INTRAVENOUS

## 2017-03-20 MED ORDER — ACETAMINOPHEN 325 MG PO TABS: 650.0000 mg | ORAL_TABLET | Freq: Four times a day (QID) | ORAL | Status: DC | PRN

## 2017-03-20 MED ORDER — METRONIDAZOLE IN NACL 5-0.79 MG/ML-% IV SOLN
500.0000 mg | Freq: Three times a day (TID) | INTRAVENOUS | Status: DC
  Administered 2017-03-20 – 2017-03-21 (×4): 500 mg via INTRAVENOUS
  Filled 2017-03-20 (×4): qty 100

## 2017-03-20 MED ORDER — SODIUM CHLORIDE 0.9 % IV SOLN
INTRAVENOUS | Status: AC
  Administered 2017-03-20 (×2): via INTRAVENOUS

## 2017-03-20 MED ORDER — ONDANSETRON HCL 4 MG/2ML IJ SOLN: 4.0000 mg | Freq: Four times a day (QID) | INTRAMUSCULAR | Status: DC | PRN

## 2017-03-20 MED ORDER — CIPROFLOXACIN IN D5W 400 MG/200ML IV SOLN
400.0000 mg | Freq: Two times a day (BID) | INTRAVENOUS | Status: DC
  Administered 2017-03-20 – 2017-03-21 (×3): 400 mg via INTRAVENOUS
  Filled 2017-03-20 (×3): qty 200

## 2017-03-20 MED ORDER — HEPARIN BOLUS VIA INFUSION
3500.0000 [IU] | Freq: Once | INTRAVENOUS | Status: AC
  Administered 2017-03-20: 3500 [IU] via INTRAVENOUS
  Filled 2017-03-20: qty 3500

## 2017-03-20 MED ORDER — COLCHICINE 0.6 MG PO TABS
0.6000 mg | ORAL_TABLET | Freq: Every day | ORAL | Status: DC
  Administered 2017-03-20 – 2017-03-21 (×2): 0.6 mg via ORAL
  Filled 2017-03-20 (×2): qty 1

## 2017-03-20 MED ORDER — HEPARIN (PORCINE) IN NACL 100-0.45 UNIT/ML-% IJ SOLN
1250.0000 [IU]/h | INTRAMUSCULAR | Status: DC
  Administered 2017-03-20: 1000 [IU]/h via INTRAVENOUS
  Administered 2017-03-20: 1250 [IU]/h via INTRAVENOUS
  Filled 2017-03-20 (×2): qty 250

## 2017-03-20 MED ORDER — ACETAMINOPHEN 650 MG RE SUPP: 650.0000 mg | Freq: Four times a day (QID) | RECTAL | Status: DC | PRN

## 2017-03-20 MED ORDER — ALLOPURINOL 100 MG PO TABS
100.0000 mg | ORAL_TABLET | Freq: Every day | ORAL | Status: DC
  Administered 2017-03-20 – 2017-03-21 (×2): 100 mg via ORAL
  Filled 2017-03-20 (×2): qty 1

## 2017-03-20 MED ORDER — AMLODIPINE BESYLATE 5 MG PO TABS: 5.0000 mg | ORAL_TABLET | Freq: Every day | ORAL | Status: DC

## 2017-03-20 MED ORDER — INSULIN ASPART 100 UNIT/ML ~~LOC~~ SOLN
0.0000 [IU] | Freq: Three times a day (TID) | SUBCUTANEOUS | Status: DC
  Administered 2017-03-20: 2 [IU] via SUBCUTANEOUS
  Administered 2017-03-20 – 2017-03-21 (×3): 1 [IU] via SUBCUTANEOUS

## 2017-03-20 NOTE — Plan of Care (Signed)
Problem: Education: Goal: Knowledge of [facility] Education information/materials will improve Outcome: Progressing Discussed plan of care and oriented patient/ family to room/hospital. Patient aware of how to call for help, call bell within reach. Discussed pain score and patient/ family voiced understanding. Deny need for spiritual needs while hospitalized.   Problem: Skin Integrity: Goal: Risk for impaired skin integrity will decrease Outcome: Progressing Skin in good condition- patient encouraged to reposition. Will remind and assist with changes in position.

## 2017-03-20 NOTE — Progress Notes (Signed)
Pharmacy Antibiotic Note  Anne Roach is a 83 y.o. female admitted on 03/19/2017 with intra-abdominal infection.  Pharmacy has been consulted for cipro dosing. WBC WNL, afebrile, CrCl ~ 35 mL/min  Plan: Cipro 400mg  IV q12 hours Flagyl 500mg  IV q8 hours F/u renal function, clinical progression, and LOT  Height: 5\' 4"  (162.6 cm) Weight: 179 lb (81.2 kg) IBW/kg (Calculated) : 54.7  Temp (24hrs), Avg:98.6 F (37 C), Min:98.6 F (37 C), Max:98.6 F (37 C)   Recent Labs Lab 03/19/17 2313 03/19/17 2347  WBC 8.4  --   CREATININE  --  1.27*    Estimated Creatinine Clearance: 35.8 mL/min (A) (by C-G formula based on SCr of 1.27 mg/dL (H)).    No Known Allergies   Thank you for allowing pharmacy to be a part of this patient's care.  [place] 03/20/2017 3:47 AM

## 2017-03-20 NOTE — ED Notes (Signed)
Dr. Conception Oms explained admission plan /plan of care to pt. and family.

## 2017-03-20 NOTE — Progress Notes (Signed)
PROGRESS NOTE  Anne Roach  SPQ:[id number] DOB: 08/18/1933 DOA: 03/19/2017 PCP: Foye Spurling, MD  Brief Narrative:   Anne Roach is a 83 y.o. female with history of diabetes mellitus type 2, hypertension, gout presented to the ER because of multiple episodes of diarrhea with associated abdominal pains.   In the ER patient was initially found to be mildly hypotensive and was given fluid bolus.  She was in atrial fibrillation with RVR.  She had right lower quadrant tenderness and CT of the abdomen was done demonstrated colitis involving the terminal ileum and ascending colon mid transverse and splenic flexure. Differentials include infectious/inflammatory/ischemic.  She has improved with antibiotics and IVF.  No stool samples have been obtained as no diarrhea since admission.  Abdominal pain is also improving.  She has had a small amount of unintentional weight loss over the last year which she attributes to decreasing appetite.  She denies abdominal pains prior to this episode and had one episode of diarrhea in the last year that has since resolved.  She has had colonoscopy before, but cannot remember when and does not remember details of test.    Assessment & Plan:   Principal Problem:   Colitis Active Problems:   Essential hypertension   OSA (obstructive sleep apnea)   DM (diabetes mellitus) (HCC)   Atrial fibrillation with RVR (HCC)  Colitis, differential includes infectious, ischemic, and inflammatory etiologies, improving -  Continue IV fluids -  Continue Cipro/Flagyl -  Awaiting stool study -  Will need GI follow-up  A. fib with RVR, chads 2 vas score 5, rate controlled -  TSH within normal limits -  Troponin negative -  Echocardiogram pending -  Discussed risk of stroke with patient and explained the need for anticoagulation to patient and daughter -  Continue heparin until tolerating by mouth -  Anticipate transitioning to Eliquis  Diabetes mellitus type 2 with  diabetic neuropathy of bilateral lower extremities -  Continue sliding scale insulin  Hypertension, hypotensive on admission -  Continue to hold antihypertensives  Gout, stable, continue allopurinol and colchicine  CKD stage 2/3, creatinine trending down slightly   DVT prophylaxis:  Heparin gtt Code Status:  Full code Family Communication:  Patient and daughter at bedside.   Disposition Plan:  Home once abdominal pain improved, diet advanced, on oral medications   Consultants:   none  Procedures:  none  Antimicrobials:  Anti-infectives    Start     Dose/Rate Route Frequency Ordered Stop   03/20/17 0400  ciprofloxacin (CIPRO) IVPB 400 mg     400 mg 200 mL/hr over 60 Minutes Intravenous Every 12 hours 03/20/17 0346     03/20/17 0345  metroNIDAZOLE (FLAGYL) IVPB 500 mg     500 mg 100 mL/hr over 60 Minutes Intravenous Every 8 hours 03/20/17 3335         Subjective: Abdominal pain is much better this morning, although still present in the upper abdomen when she presses.  Denies vomiting or diarrhea since admission   Objective: Vitals:   03/20/17 0400 03/20/17 0415 03/20/17 0500 03/20/17 0844  BP: 110/74   104/61  Pulse: 99   (!) 40  Resp: 17   14  Temp:  98.7 F (37.1 C)  98.1 F (36.7 C)  TempSrc:  Oral  Oral  SpO2: 92%   96%  Weight:  78.6 kg (173 lb 4.5 oz) 78.6 kg (173 lb 4.5 oz)   Height:  5\' 4"  (1.626 m)  Intake/Output Summary (Last 24 hours) at 03/20/17 1133 Last data filed at 03/20/17 0700  Gross per 24 hour  Intake           1486.5 ml  Output              525 ml  Net            961.5 ml   Filed Weights   03/19/17 2312 03/20/17 0415 03/20/17 0500  Weight: 81.2 kg (179 lb) 78.6 kg (173 lb 4.5 oz) 78.6 kg (173 lb 4.5 oz)    Examination:  General exam:  Adult female.  No acute distress.  HEENT:  NCAT, MMM Respiratory system: Clear to auscultation bilaterally Cardiovascular system: IRRR normal S1/S2. No murmurs, rubs, gallops or clicks.  Warm  extremities Gastrointestinal system: Higher pitched but present bowel sounds, soft, mildly distended and TTP in the epigastrium and LUQ without rebound or guarding.   MSK:  Normal tone and bulk, no lower extremity edema Neuro:  Grossly moves all extremities.      Data Reviewed: I have personally reviewed following labs and imaging studies  CBC:  Recent Labs Lab 03/19/17 2313 03/20/17 0429  WBC 8.4 6.6  NEUTROABS  --  3.7  HGB 12.4 11.8*  HCT 37.5 35.2*  MCV 91.2 91.4  PLT 186 244*   Basic Metabolic Panel:  Recent Labs Lab 03/19/17 2347 03/20/17 0429  NA 136 139  K 3.6 3.5  CL 101 104  CO2 24 23  GLUCOSE 149* 122*  BUN 16 14  CREATININE 1.27* 1.10*  CALCIUM 8.7* 8.1*  MG  --  1.6*   GFR: Estimated Creatinine Clearance: 40.7 mL/min (A) (by C-G formula based on SCr of 1.1 mg/dL (H)). Liver Function Tests:  Recent Labs Lab 03/19/17 2347 03/20/17 0429  AST 26 22  ALT 20 17  ALKPHOS 35* 30*  BILITOT 0.5 0.6  PROT 6.5 6.0*  ALBUMIN 3.2* 2.9*    Recent Labs Lab 03/19/17 2347  LIPASE 22   No results for input(s): AMMONIA in the last 168 hours. Coagulation Profile: No results for input(s): INR, PROTIME in the last 168 hours. Cardiac Enzymes:  Recent Labs Lab 03/20/17 0429  TROPONINI <0.03   BNP (last 3 results) No results for input(s): PROBNP in the last 8760 hours. HbA1C: No results for input(s): HGBA1C in the last 72 hours. CBG:  Recent Labs Lab 03/20/17 0841  GLUCAP 137*   Lipid Profile: No results for input(s): CHOL, HDL, LDLCALC, TRIG, CHOLHDL, LDLDIRECT in the last 72 hours. Thyroid Function Tests:  Recent Labs  03/20/17 0429  TSH 2.591   Anemia Panel: No results for input(s): VITAMINB12, FOLATE, FERRITIN, TIBC, IRON, RETICCTPCT in the last 72 hours. Urine analysis:    Component Value Date/Time   COLORURINE STRAW (A) 03/20/2017 0137   APPEARANCEUR CLEAR 03/20/2017 0137   LABSPEC 1.010 03/20/2017 0137   PHURINE 6.0 03/20/2017  0137   GLUCOSEU NEGATIVE 03/20/2017 0137   HGBUR NEGATIVE 03/20/2017 0137   BILIRUBINUR NEGATIVE 03/20/2017 0137   KETONESUR NEGATIVE 03/20/2017 0137   PROTEINUR NEGATIVE 03/20/2017 0137   UROBILINOGEN 0.2 06/12/2011 1201   NITRITE NEGATIVE 03/20/2017 0137   LEUKOCYTESUR TRACE (A) 03/20/2017 0137   Sepsis Labs: @LABRCNTIP (procalcitonin:4,lacticidven:4)  ) Recent Results (from the past 240 hour(s))  MRSA PCR Screening     Status: None   Collection Time: 03/20/17  5:02 AM  Result Value Ref Range Status   MRSA by PCR NEGATIVE NEGATIVE Final    Comment:  The GeneXpert MRSA Assay (FDA approved for NASAL specimens only), is one component of a comprehensive MRSA colonization surveillance program. It is not intended to diagnose MRSA infection nor to guide or monitor treatment for MRSA infections.       Radiology Studies: Dg Chest 2 View  Result Date: 03/19/2017 CLINICAL DATA:  Chest pain and dyspnea.  Weakness today. EXAM: CHEST  2 VIEW COMPARISON:  06/12/2011 FINDINGS: Mild to moderate cardiomegaly, unchanged. The lungs are clear except for stable linear scarring in the left base. The pulmonary vasculature is normal. There is no pleural effusion. Hilar and mediastinal contours are unremarkable and unchanged. IMPRESSION: Stable cardiomegaly. No consolidation or effusion. Normal vasculature. Electronically Signed   By: Andreas Newport M.D.   On: 03/19/2017 23:48   Ct Abdomen Pelvis W Contrast  Result Date: 03/20/2017 CLINICAL DATA:  Abdominal pain with diarrhea EXAM: CT ABDOMEN AND PELVIS WITH CONTRAST TECHNIQUE: Multidetector CT imaging of the abdomen and pelvis was performed using the standard protocol following bolus administration of intravenous contrast. CONTRAST:  91mL ISOVUE-300 IOPAMIDOL (ISOVUE-300) INJECTION 61% COMPARISON:  05/28/2005 FINDINGS: Lower chest: Patchy dependent atelectasis. Cardiomegaly. Trace pericardial effusion Hepatobiliary: No focal hepatic  abnormality. No calcified gallstones or biliary dilatation Pancreas: Unremarkable. No pancreatic ductal dilatation or surrounding inflammatory changes. Spleen: Normal in size without focal abnormality. Adrenals/Urinary Tract: Adrenal glands are within normal limits. 14 mm cyst mid pole of the right kidney. Bladder normal Stomach/Bowel: Stomach is nonenlarged.  No dilated small bowel. There is thickening of the terminal ileum an ileocecal junction. Wall thickening and mild inflammation of the ascending colon and hepatic flexure with mild wall thickening of the mid transverse colon. No pneumatosis. Appendix is non identified . Vascular/Lymphatic: Aortic atherosclerosis. No significantly enlarged lymph nodes Reproductive: Uterus and bilateral adnexa are unremarkable. Other: Trace free fluid in the pelvis. No free air. Hazy density within the central mesentery. Musculoskeletal: No acute or suspicious bone lesion. Multilevel degenerative changes. IMPRESSION: 1. There is focal wall thickening involving the terminal ileum, ascending colon, splenic flexure and mid transverse colon suspicious for colitis of infectious, inflammatory, or ischemic etiology. No pneumatosis. No venous gas. 2. Hazy density within the central mesentery is nonspecific and could relate to edema or nonspecific mesenteric inflammation 3. Trace free fluid in the pelvis. Electronically Signed   By: Donavan Foil M.D.   On: 03/20/2017 01:47     Scheduled Meds: . allopurinol  100 mg Oral Daily  . colchicine  0.6 mg Oral Daily  . insulin aspart  0-9 Units Subcutaneous TID WC  . memantine  10 mg Oral Daily   Continuous Infusions: . sodium chloride 100 mL/hr at 03/20/17 0518  . ciprofloxacin Stopped (03/20/17 7322)  . heparin 1,000 Units/hr (03/20/17 0521)  . metronidazole Stopped (03/20/17 0447)     LOS: 0 days    Time spent: 30 min    Janece Canterbury, MD [facility] Pager [phone]  If 7PM-7AM, please contact  night-coverage [email or web address] Password [email or web address] 03/20/2017, 11:33 AM

## 2017-03-20 NOTE — Progress Notes (Signed)
CRITICAL VALUE ALERT  Critical value received:  Lactic Acid 2.1  Date of notification:  03/20/2017  Time of notification:  0555  Critical value read back:Yes.    Nurse who received alert:  Pieter Partridge, RN  MD notified (1st page):  Tylene Fantasia, NP  Time of first page:  0605  MD notified (2nd page):  Time of second page:  Responding MD:  Tylene Fantasia  Time MD responded:  0630  Milford Cage, RN

## 2017-03-20 NOTE — H&P (Addendum)
History and Physical    Anne Roach WUJ:[id number] DOB: 09/16/1933 DOA: 03/19/2017  PCP: Foye Spurling, MD  Patient coming from: Home.  Chief Complaint: Diarrhea and weakness.  HPI: Anne Roach is a 83 y.o. female with history of diabetes mellitus type 2, hypertension gout presents to the ER because of multiple episodes of diarrhea since yesterday. Has been feeling since the diarrhea started. Denies any abdominal pain nausea vomiting. Denies any blood in the diarrhea. Denies any recent sick contacts or travel. Patient states he also has been feeling weak and dizzy since the episode started. Denies any loss of consciousness chest pain or shortness of breath.   ED Course: In the ER patient was initially found to be mildly hypotensive and was given fluid bolus. Patient also was tachycardic and EKG shows atrial fibrillation with RVR. On exam patient had mild right lower quadrant tenderness and CT of the abdomen was done which shows colitis involving the terminal ileum and ascending colon mid transverse and splenic flexure. Differentials include infectious/inflammatory/ischemic. Patient's blood pressure improved with fluids. Heart rate was fluctuating between 90-110 in atrial fibrillation.  While in the ER patient started complaining of bilateral lower extremity numbness and on exam patient had no sensation below the knee. Had good strength. No deep tendon reflexes. I consulted neurologist Dr. Cheral Marker who had examined patient along with me and stated that patient's symptoms are more likely from diabetic peripheral neuropathy. No further neurology workup required.  Review of Systems: As per HPI, rest all negative.   Past Medical History:  Diagnosis Date  . DM (diabetes mellitus) ([place])   . HTN (hypertension)   . Tremor     Past Surgical History:  Procedure Laterality Date  . APPENDECTOMY    . CARPAL TUNNEL RELEASE    . CATARACT EXTRACTION Bilateral   . HERNIA REPAIR       reports that she has never smoked. She has never used smokeless tobacco. She reports that she does not drink alcohol or use drugs.  No Known Allergies  Family History  Problem Relation Age of Onset  . Diabetes Father   . Asthma Brother     Prior to Admission medications   Medication Sig Start Date End Date Taking? Authorizing Provider  allopurinol (ZYLOPRIM) 100 MG tablet Take 1 tablet (100 mg total) by mouth daily. 01/14/17   Edrick Kins, DPM  amLODipine (NORVASC) 5 MG tablet Take 5 mg by mouth daily. 06/21/15   Historical Provider, MD  colchicine 0.6 MG tablet Take 1 tablet (0.6 mg total) by mouth daily. 01/14/17   Edrick Kins, DPM  irbesartan-hydrochlorothiazide (AVALIDE) 150-12.5 MG per tablet Take 1 tablet by mouth daily.    Historical Provider, MD  memantine (NAMENDA) 10 MG tablet Take 10 mg by mouth daily.  08/17/14   Historical Provider, MD  metFORMIN (GLUCOPHAGE-XR) 750 MG 24 hr tablet Take 750 mg by mouth daily after supper. 05/14/15   Historical Provider, MD  Multiple Vitamins-Minerals (ICAPS AREDS 2 PO) Take 1 tablet by mouth daily.    Historical Provider, MD  Omega 3-6-9 Fatty Acids (TRIPLE OMEGA COMPLEX PO) Take 1 tablet by mouth daily.    Historical Provider, MD  traMADol (ULTRAM) 50 MG tablet Take 1 tablet (50 mg total) by mouth every 6 (six) hours as needed. 02/11/17   Edrick Kins, DPM    Physical Exam: Vitals:   03/20/17 0115 03/20/17 0215 03/20/17 0230 03/20/17 0320  BP: 102/73 (!) 103/54 (!) 109/58 102/65  Pulse:  76 (!) 58 96  Resp: 13 17 14 16   Temp:      TempSrc:      SpO2:  93% 97% 99%  Weight:      Height:          Constitutional: Moderately built and nourished. Vitals:   03/20/17 0115 03/20/17 0215 03/20/17 0230 03/20/17 0320  BP: 102/73 (!) 103/54 (!) 109/58 102/65  Pulse:  76 (!) 58 96  Resp: 13 17 14 16   Temp:      TempSrc:      SpO2:  93% 97% 99%  Weight:      Height:       Eyes: Anicteric. No pallor. ENMT: No discharge from the ears  eyes nose or mouth. Neck: No mass felt. No neck rigidity. Respiratory: No rhonchi or crepitations. Cardiovascular: S1 and S2 heard no murmurs appreciated. Abdomen: Soft nontender positive sounds present. No guarding or rigidity. Musculoskeletal: No edema. No joint effusion. Skin: No rash. Skin appears warm. Neurologic: Alert awake oriented to time place and person. Moves all extremities 5 x 5. Decreased sensation below both knees. Poor deep tendon reflexes. No facial asymmetry. Psychiatric: Appears normal. Normal affect.   Labs on Admission: I have personally reviewed following labs and imaging studies  CBC:  Recent Labs Lab 03/19/17 2313  WBC 8.4  HGB 12.4  HCT 37.5  MCV 91.2  PLT 381   Basic Metabolic Panel:  Recent Labs Lab 03/19/17 2347  NA 136  K 3.6  CL 101  CO2 24  GLUCOSE 149*  BUN 16  CREATININE 1.27*  CALCIUM 8.7*   GFR: Estimated Creatinine Clearance: 35.8 mL/min (A) (by C-G formula based on SCr of 1.27 mg/dL (H)). Liver Function Tests:  Recent Labs Lab 03/19/17 2347  AST 26  ALT 20  ALKPHOS 35*  BILITOT 0.5  PROT 6.5  ALBUMIN 3.2*    Recent Labs Lab 03/19/17 2347  LIPASE 22   No results for input(s): AMMONIA in the last 168 hours. Coagulation Profile: No results for input(s): INR, PROTIME in the last 168 hours. Cardiac Enzymes: No results for input(s): CKTOTAL, CKMB, CKMBINDEX, TROPONINI in the last 168 hours. BNP (last 3 results) No results for input(s): PROBNP in the last 8760 hours. HbA1C: No results for input(s): HGBA1C in the last 72 hours. CBG: No results for input(s): GLUCAP in the last 168 hours. Lipid Profile: No results for input(s): CHOL, HDL, LDLCALC, TRIG, CHOLHDL, LDLDIRECT in the last 72 hours. Thyroid Function Tests: No results for input(s): TSH, T4TOTAL, FREET4, T3FREE, THYROIDAB in the last 72 hours. Anemia Panel: No results for input(s): VITAMINB12, FOLATE, FERRITIN, TIBC, IRON, RETICCTPCT in the last 72  hours. Urine analysis:    Component Value Date/Time   COLORURINE STRAW (A) 03/20/2017 0137   APPEARANCEUR CLEAR 03/20/2017 0137   LABSPEC 1.010 03/20/2017 0137   PHURINE 6.0 03/20/2017 0137   GLUCOSEU NEGATIVE 03/20/2017 0137   HGBUR NEGATIVE 03/20/2017 0137   BILIRUBINUR NEGATIVE 03/20/2017 0137   KETONESUR NEGATIVE 03/20/2017 0137   PROTEINUR NEGATIVE 03/20/2017 0137   UROBILINOGEN 0.2 06/12/2011 1201   NITRITE NEGATIVE 03/20/2017 0137   LEUKOCYTESUR TRACE (A) 03/20/2017 0137   Sepsis Labs: @LABRCNTIP (procalcitonin:4,lacticidven:4) )No results found for this or any previous visit (from the past 240 hour(s)).   Radiological Exams on Admission: Dg Chest 2 View  Result Date: 03/19/2017 CLINICAL DATA:  Chest pain and dyspnea.  Weakness today. EXAM: CHEST  2 VIEW COMPARISON:  06/12/2011 FINDINGS: Mild to moderate cardiomegaly,  unchanged. The lungs are clear except for stable linear scarring in the left base. The pulmonary vasculature is normal. There is no pleural effusion. Hilar and mediastinal contours are unremarkable and unchanged. IMPRESSION: Stable cardiomegaly. No consolidation or effusion. Normal vasculature. Electronically Signed   By: Andreas Newport M.D.   On: 03/19/2017 23:48   Ct Abdomen Pelvis W Contrast  Result Date: 03/20/2017 CLINICAL DATA:  Abdominal pain with diarrhea EXAM: CT ABDOMEN AND PELVIS WITH CONTRAST TECHNIQUE: Multidetector CT imaging of the abdomen and pelvis was performed using the standard protocol following bolus administration of intravenous contrast. CONTRAST:  46mL ISOVUE-300 IOPAMIDOL (ISOVUE-300) INJECTION 61% COMPARISON:  05/28/2005 FINDINGS: Lower chest: Patchy dependent atelectasis. Cardiomegaly. Trace pericardial effusion Hepatobiliary: No focal hepatic abnormality. No calcified gallstones or biliary dilatation Pancreas: Unremarkable. No pancreatic ductal dilatation or surrounding inflammatory changes. Spleen: Normal in size without focal  abnormality. Adrenals/Urinary Tract: Adrenal glands are within normal limits. 14 mm cyst mid pole of the right kidney. Bladder normal Stomach/Bowel: Stomach is nonenlarged.  No dilated small bowel. There is thickening of the terminal ileum an ileocecal junction. Wall thickening and mild inflammation of the ascending colon and hepatic flexure with mild wall thickening of the mid transverse colon. No pneumatosis. Appendix is non identified . Vascular/Lymphatic: Aortic atherosclerosis. No significantly enlarged lymph nodes Reproductive: Uterus and bilateral adnexa are unremarkable. Other: Trace free fluid in the pelvis. No free air. Hazy density within the central mesentery. Musculoskeletal: No acute or suspicious bone lesion. Multilevel degenerative changes. IMPRESSION: 1. There is focal wall thickening involving the terminal ileum, ascending colon, splenic flexure and mid transverse colon suspicious for colitis of infectious, inflammatory, or ischemic etiology. No pneumatosis. No venous gas. 2. Hazy density within the central mesentery is nonspecific and could relate to edema or nonspecific mesenteric inflammation 3. Trace free fluid in the pelvis. Electronically Signed   By: Donavan Foil M.D.   On: 03/20/2017 01:47    EKG: Independently reviewed. Atrial fibrillation with RVR.  Assessment/Plan Principal Problem:   Colitis Active Problems:   Essential hypertension   OSA (obstructive sleep apnea)   DM (diabetes mellitus) (HCC)   Atrial fibrillation with RVR (HCC)    1. Colitis differentials include infectious/ischemic/inflammatory - patient received 2 L normal saline bolus in the ER following which blood pressure improved. Will check stool studies including GI pathogen panel and C. difficile. I have placed patient on Cipro and Flagyl for now. Follow lactate levels to rule out ischemia. 2. Atrial fibrillation with RVR - this is a new diagnosis for the patient. Since patient has chads 2 vasc score of 5  patient is placed on heparin. Not on any rate limiting medications at this time. Closely observing telemetry. Check 2-D echo and TSH and troponin. 3. Diabetes mellitus type 2 - will place patient on sliding scale coverage. 4. Hypertension - since patient was hypotensive on presentation will hold all antihypertensives. 5. History of gout on allopurinol and colchicine. 6. Peripheral neuropathy - patient was complaining of bilateral lower extremity numbness. Exam and patient along with Dr. Cheral Marker, on-call neurologist. As per the neurologist patient's symptoms are consistent with peripheral neuropathy most likely from diabetes. 7. Possible chronic kidney disease stage II to 3 - follow metabolic panel.  DVT prophylaxis:  Heparin. Code Status:  Full code.  Family Communication:  Patient's daughter.  Disposition Plan:  Home.  Consults called:  Discuss with neurologist.  Admission status:  Inpatient.    Rise Patience MD [facility] Pager [phone].  If 7PM-7AM, please contact night-coverage [email or web address] Password [email or web address]  03/20/2017, 3:38 AM

## 2017-03-20 NOTE — Progress Notes (Signed)
ANTICOAGULATION CONSULT NOTE - Initial Consult  Pharmacy Consult for heparin Indication: atrial fibrillation  No Known Allergies  Patient Measurements: Height: 5\' 4"  (162.6 cm) Weight: 179 lb (81.2 kg) IBW/kg (Calculated) : 54.7 Heparin Dosing Weight: 72.2 Kg  Vital Signs: Temp: 98.6 F (37 C) (05/03 2316) Temp Source: Oral (05/03 2316) BP: 102/65 (05/04 0320) Pulse Rate: 96 (05/04 0320)  Labs:  Recent Labs  03/19/17 2313 03/19/17 2347  HGB 12.4  --   HCT 37.5  --   PLT 186  --   CREATININE  --  1.27*   Estimated Creatinine Clearance: 35.8 mL/min (A) (by C-G formula based on SCr of 1.27 mg/dL (H)).  Medical History: Past Medical History:  Diagnosis Date  . DM (diabetes mellitus) ([place])   . HTN (hypertension)   . Tremor    Assessment: Anne Roach is a 83 yo female admitted with an intra-abdominal infection found to have atrial fibrillation. Patient was not taking anticoagulation prior to admission. CBC WNL.   Goal of Therapy:  Heparin level 0.3-0.7 units/ml Monitor platelets by anticoagulation protocol: Yes   Plan:  Give 3500 units bolus x 1 Start heparin infusion at 1000 units/hr Check anti-Xa level in 8 hours and daily while on heparin Continue to monitor H&H and platelets  Autumm Hattery L Tymeka Privette 03/20/2017,3:53 AM

## 2017-03-20 NOTE — ED Notes (Signed)
Patient transported to CT scan . 

## 2017-03-20 NOTE — Progress Notes (Signed)
ANTICOAGULATION CONSULT NOTE - Initial Consult  Pharmacy Consult for heparin Indication: atrial fibrillation  No Known Allergies  Patient Measurements: Height: 5\' 4"  (162.6 cm) Weight: 173 lb 4.5 oz (78.6 kg) IBW/kg (Calculated) : 54.7 Heparin Dosing Weight: 72.2 Kg  Vital Signs: Temp: 97.9 F (36.6 C) (05/04 1200) Temp Source: Oral (05/04 1200) BP: 121/61 (05/04 1200) Pulse Rate: 81 (05/04 1200)  Assessment: Anne Roach is a 83 yo female admitted with an intra-abdominal infection found to have atrial fibrillation. Patient was not taking anticoagulation prior to admission. CBC WNL. First heparin level was undetectable and no issues documented.    Goal of Therapy:  Heparin level 0.3-0.7 units/ml Monitor platelets by anticoagulation protocol: Yes   Plan:  Give heparin 3,500 unit bolus Increase heparin gtt to heparin infusion at 1,250 units/hr Monitor daily heparin level, CBC, s/s of bleed  Elenor Quinones, PharmD, [facility] Clinical Pharmacist Pager [phone] 03/20/2017 3:41 PM

## 2017-03-20 NOTE — Progress Notes (Signed)
  Echocardiogram 2D Echocardiogram has been performed.  Naysha Sholl T Kilee Hedding 03/20/2017, 5:11 PM

## 2017-03-21 DIAGNOSIS — I10 Essential (primary) hypertension: Secondary | ICD-10-CM

## 2017-03-21 DIAGNOSIS — E119 Type 2 diabetes mellitus without complications: Secondary | ICD-10-CM

## 2017-03-21 LAB — GASTROINTESTINAL PANEL BY PCR, STOOL (REPLACES STOOL CULTURE)
ADENOVIRUS F40/41: NOT DETECTED
Astrovirus: NOT DETECTED
CAMPYLOBACTER SPECIES: NOT DETECTED
CRYPTOSPORIDIUM: NOT DETECTED
CYCLOSPORA CAYETANENSIS: NOT DETECTED
ENTEROAGGREGATIVE E COLI (EAEC): DETECTED — AB
ENTEROPATHOGENIC E COLI (EPEC): DETECTED — AB
ENTEROTOXIGENIC E COLI (ETEC): DETECTED — AB
Entamoeba histolytica: NOT DETECTED
Giardia lamblia: NOT DETECTED
Norovirus GI/GII: NOT DETECTED
PLESIMONAS SHIGELLOIDES: NOT DETECTED
ROTAVIRUS A: NOT DETECTED
SALMONELLA SPECIES: NOT DETECTED
SHIGELLA/ENTEROINVASIVE E COLI (EIEC): NOT DETECTED
Sapovirus (I, II, IV, and V): NOT DETECTED
Shiga like toxin producing E coli (STEC): NOT DETECTED
VIBRIO SPECIES: NOT DETECTED
Vibrio cholerae: NOT DETECTED
YERSINIA ENTEROCOLITICA: NOT DETECTED

## 2017-03-21 LAB — HEPARIN LEVEL (UNFRACTIONATED): HEPARIN UNFRACTIONATED: 0.41 [IU]/mL (ref 0.30–0.70)

## 2017-03-21 LAB — GLUCOSE, CAPILLARY: GLUCOSE-CAPILLARY: 141 mg/dL — AB (ref 65–99)

## 2017-03-21 MED ORDER — APIXABAN 5 MG PO TABS: 5.0000 mg | ORAL_TABLET | Freq: Two times a day (BID) | ORAL | Status: DC

## 2017-03-21 MED ORDER — METRONIDAZOLE 500 MG PO TABS: 500.0000 mg | ORAL_TABLET | Freq: Three times a day (TID) | ORAL | 0 refills | Status: DC

## 2017-03-21 MED ORDER — APIXABAN 5 MG PO TABS: 5.0000 mg | ORAL_TABLET | Freq: Two times a day (BID) | ORAL | 0 refills | Status: DC

## 2017-03-21 MED ORDER — APIXABAN 5 MG PO TABS
5.0000 mg | ORAL_TABLET | Freq: Two times a day (BID) | ORAL | Status: DC
  Administered 2017-03-21: 5 mg via ORAL
  Filled 2017-03-21: qty 1

## 2017-03-21 MED ORDER — CIPROFLOXACIN HCL 500 MG PO TABS: 500.0000 mg | ORAL_TABLET | Freq: Two times a day (BID) | ORAL | 0 refills | Status: DC

## 2017-03-21 NOTE — Care Management Note (Signed)
Case Management Note  Patient Details  Name: NASIRA JANUSZ MRN: [id number] Date of Birth: 01-13-1933  Subjective/Objective:                  atrial fibrillation Action/Plan: Discharge planning Expected Discharge Date:  03/21/17               Expected Discharge Plan:  Home/Self Care  In-House Referral:     Discharge planning Services  CM Consult, Medication Assistance  Post Acute Care Choice:    Choice offered to:  Patient  DME Arranged:  N/A DME Agency:  NA  HH Arranged:    [place] Agency:  NA  Status of Service:  Completed, signed off  If discussed at [place] of Stay Meetings, dates discussed:    Additional Comments: CM gave pt free trial card for Eliquis. Pt verbalized understanding this card will pay for today's prescription and give time for insurance to authorize for refills.  No other CM needs were communicated. Dellie Catholic, RN 03/21/2017, 8:48 AM

## 2017-03-21 NOTE — Discharge Summary (Addendum)
Physician Discharge Summary  Anne Roach LPF:[id number] DOB: 13-Jun-1933 DOA: 03/19/2017  PCP: Foye Spurling, MD  Admit date: 03/19/2017 Discharge date: 03/21/2017  Admitted From: home  Disposition:  home  Recommendations for Outpatient Follow-up:  1. Follow up with PCP in 1 week to review pending test results, management of atrial fibrillation 2. Gastroenterology referral if noninfectious etiology becomes more likely 3. Please obtain BMP/CBC in one week 4. Please follow up on the following pending results:  GI pathogen panel  Home Health:  none  Equipment/Devices:  none   Discharge Condition:  Stable, improved CODE STATUS:  Full code  Diet recommendation:  Healthy heart   Brief/Interim Summary:  Anne Roach a 83 y.o.femalewith history of diabetes mellitus type 2, hypertension, gout presented to the ER because of multiple episodes of diarrhea with associated abdominal pains.   In the ER patient was initially found to be mildly hypotensive and was given fluid bolus.  She was in atrial fibrillation with RVR.  She had right lower quadrant tenderness and CT of the abdomen was done demonstrated colitis involving the terminal ileum and ascending colon mid transverse and splenic flexure. Differentials include infectious/inflammatory/ischemic.  She has improved with antibiotics and IVF.  Stool sample is pending at time of discharge, but her diarrhea and abdominal pain have improved.  She has had a small amount of unintentional weight loss over the last year which she attributes to decreasing appetite.  She denies abdominal pains prior to this episode and had one episode of diarrhea in the last year that has since resolved.  She has had colonoscopy before, but cannot remember when and does not remember details of test.  She was given a prescription for cipro/flagyl to continue at home.  Her heart rate has been in the 80s without rate-control medications.  Her blood pressures have been low  normal and she was advised to stop her home blood pressure medications until she follows up with her PCP to prevent hypotension.  She was started on eliquis for stroke prevention.    Discharge Diagnoses:  Principal Problem:   Colitis Active Problems:   Essential hypertension   OSA (obstructive sleep apnea)   DM (diabetes mellitus) (HCC)   Atrial fibrillation with RVR (HCC)  Colitis, differential includes infectious, ischemic, and inflammatory etiologies, improved with IVF and antibiotics -  Continue Cipro/Flagyl to complete a 7-day course -  Awaiting GI pathogen panel -  Will need GI follow-up if index of suspicion for IBD increases  New onset A. fib with RVR, chads 2 vas score 5, rate controlled without rate-controlling medications -  TSH within normal limits -  Troponin negative -  Echocardiogram demonstrates preserved ejection fraction, mild LVH and mild MR -  Discussed risk of stroke with patient and explained the need for anticoagulation to patient and daughter -  initially started on heparin but transitioned to Eliquis  Diabetes mellitus type 2 with diabetic neuropathy of bilateral lower extremities -  given sliding scale insulin during hospitalization but advised to resume metformin at discharge  Hypertension, hypotensive on admission -  Continue to hold antihypertensives  Gout, stable, continue allopurinol and colchicine  CKD stage 2/3, creatinine trended down from 1.27 to 1.1  Hypomagnesemia, given IV magnesium repletion  Discharge Instructions  Discharge Instructions    Call MD for:  difficulty breathing, headache or visual disturbances    Complete by:  As directed    Call MD for:  extreme fatigue    Complete by:  As directed    Call MD for:  hives    Complete by:  As directed    Call MD for:  persistant dizziness or light-headedness    Complete by:  As directed    Call MD for:  persistant nausea and vomiting    Complete by:  As directed    Call MD for:   severe uncontrolled pain    Complete by:  As directed    Call MD for:  temperature >100.4    Complete by:  As directed    Diet Carb Modified    Complete by:  As directed    Increase activity slowly    Complete by:  As directed        Medication List    STOP taking these medications   amLODipine 5 MG tablet Commonly known as:  NORVASC   irbesartan-hydrochlorothiazide 150-12.5 MG tablet Commonly known as:  AVALIDE     TAKE these medications   allopurinol 100 MG tablet Commonly known as:  ZYLOPRIM Take 1 tablet (100 mg total) by mouth daily.   apixaban 5 MG Tabs tablet Commonly known as:  ELIQUIS Take 1 tablet (5 mg total) by mouth 2 (two) times daily.   ciprofloxacin 500 MG tablet Commonly known as:  CIPRO Take 1 tablet (500 mg total) by mouth 2 (two) times daily.   colchicine 0.6 MG tablet Take 1 tablet (0.6 mg total) by mouth daily.   ICAPS AREDS 2 PO Take 1 tablet by mouth daily.   memantine 10 MG tablet Commonly known as:  NAMENDA Take 10 mg by mouth daily.   metFORMIN 750 MG 24 hr tablet Commonly known as:  GLUCOPHAGE-XR Take 750 mg by mouth daily after supper.   metroNIDAZOLE 500 MG tablet Commonly known as:  FLAGYL Take 1 tablet (500 mg total) by mouth 3 (three) times daily.   TRIPLE OMEGA COMPLEX PO Take 1 tablet by mouth daily.      Follow-up Information    Foye Spurling, MD. Schedule an appointment as soon as possible for a visit in 1 week(s).   Specialty:  Internal Medicine Contact information: [address] [place] [address] [phone]          No Known Allergies  Consultations: none   Procedures/Studies: Dg Chest 2 View  Result Date: 03/19/2017 CLINICAL DATA:  Chest pain and dyspnea.  Weakness today. EXAM: CHEST  2 VIEW COMPARISON:  06/12/2011 FINDINGS: Mild to moderate cardiomegaly, unchanged. The lungs are clear except for stable linear scarring in the left base. The pulmonary vasculature is normal.  There is no pleural effusion. Hilar and mediastinal contours are unremarkable and unchanged. IMPRESSION: Stable cardiomegaly. No consolidation or effusion. Normal vasculature. Electronically Signed   By: Andreas Newport M.D.   On: 03/19/2017 23:48   Ct Abdomen Pelvis W Contrast  Result Date: 03/20/2017 CLINICAL DATA:  Abdominal pain with diarrhea EXAM: CT ABDOMEN AND PELVIS WITH CONTRAST TECHNIQUE: Multidetector CT imaging of the abdomen and pelvis was performed using the standard protocol following bolus administration of intravenous contrast. CONTRAST:  17mL ISOVUE-300 IOPAMIDOL (ISOVUE-300) INJECTION 61% COMPARISON:  05/28/2005 FINDINGS: Lower chest: Patchy dependent atelectasis. Cardiomegaly. Trace pericardial effusion Hepatobiliary: No focal hepatic abnormality. No calcified gallstones or biliary dilatation Pancreas: Unremarkable. No pancreatic ductal dilatation or surrounding inflammatory changes. Spleen: Normal in size without focal abnormality. Adrenals/Urinary Tract: Adrenal glands are within normal limits. 14 mm cyst mid pole of the right kidney. Bladder normal Stomach/Bowel: Stomach is nonenlarged.  No dilated small bowel. There is thickening of the terminal ileum an ileocecal junction. Wall thickening and mild inflammation of the ascending colon and hepatic flexure with mild wall thickening of the mid transverse colon. No pneumatosis. Appendix is non identified . Vascular/Lymphatic: Aortic atherosclerosis. No significantly enlarged lymph nodes Reproductive: Uterus and bilateral adnexa are unremarkable. Other: Trace free fluid in the pelvis. No free air. Hazy density within the central mesentery. Musculoskeletal: No acute or suspicious bone lesion. Multilevel degenerative changes. IMPRESSION: 1. There is focal wall thickening involving the terminal ileum, ascending colon, splenic flexure and mid transverse colon suspicious for colitis of infectious, inflammatory, or ischemic etiology. No pneumatosis.  No venous gas. 2. Hazy density within the central mesentery is nonspecific and could relate to edema or nonspecific mesenteric inflammation 3. Trace free fluid in the pelvis. Electronically Signed   By: Donavan Foil M.D.   On: 03/20/2017 01:47      Subjective: Abdominal pain improved.  Less frequent but still liquidy diarrhea.  Eating and drinking okay.  Asking to go home.    Discharge Exam: Vitals:   03/20/17 2148 03/21/17 0549  BP: (!) 112/53 126/71  Pulse: 75 79  Resp:  18  Temp:  97.8 F (36.6 C)   Vitals:   03/20/17 1915 03/20/17 2146 03/20/17 2148 03/21/17 0549  BP: (!) 118/54 (!) 120/36 (!) 112/53 126/71  Pulse: 74 91 75 79  Resp: 18 18  18   Temp: 98.4 F (36.9 C) 98 F (36.7 C)  97.8 F (36.6 C)  TempSrc:      SpO2: 94% 95%  95%  Weight: 78.9 kg (173 lb 15.1 oz)   81.5 kg (179 lb 10.8 oz)  Height: 5\' 6"  (1.676 m)       General exam:  Adult female.  No acute distress.  HEENT:  NCAT, MMM Respiratory system: Clear to auscultation bilaterally Cardiovascular system: IRRR normal S1/S2. No murmurs, rubs, gallops or clicks.  Warm extremities Gastrointestinal system:  NABS, soft, mildly distended and nontender MSK:  Normal tone and bulk, no lower extremity edema Neuro:  Grossly moves all extremities.      The results of significant diagnostics from this hospitalization (including imaging, microbiology, ancillary and laboratory) are listed below for reference.     Microbiology: Recent Results (from the past 240 hour(s))  C difficile quick scan w PCR reflex     Status: None   Collection Time: 03/20/17  3:39 AM  Result Value Ref Range Status   C Diff antigen NEGATIVE NEGATIVE Final   C Diff toxin NEGATIVE NEGATIVE Final   C Diff interpretation No C. difficile detected.  Final  MRSA PCR Screening     Status: None   Collection Time: 03/20/17  5:02 AM  Result Value Ref Range Status   MRSA by PCR NEGATIVE NEGATIVE Final    Comment:        The GeneXpert MRSA Assay  (FDA approved for NASAL specimens only), is one component of a comprehensive MRSA colonization surveillance program. It is not intended to diagnose MRSA infection nor to guide or monitor treatment for MRSA infections.      Labs: BNP (last 3 results) No results for input(s): BNP in the last 8760 hours. Basic Metabolic Panel:  Recent Labs Lab 03/19/17 2347 03/20/17 0429  NA 136 139  K 3.6 3.5  CL 101 104  CO2 24 23  GLUCOSE 149* 122*  BUN 16 14  CREATININE 1.27* 1.10*  CALCIUM 8.7* 8.1*  MG  --  1.6*   Liver Function Tests:  Recent Labs Lab 03/19/17 2347 03/20/17 0429  AST 26 22  ALT 20 17  ALKPHOS 35* 30*  BILITOT 0.5 0.6  PROT 6.5 6.0*  ALBUMIN 3.2* 2.9*    Recent Labs Lab 03/19/17 2347  LIPASE 22   No results for input(s): AMMONIA in the last 168 hours. CBC:  Recent Labs Lab 03/19/17 2313 03/20/17 0429  WBC 8.4 6.6  NEUTROABS  --  3.7  HGB 12.4 11.8*  HCT 37.5 35.2*  MCV 91.2 91.4  PLT 186 149*   Cardiac Enzymes:  Recent Labs Lab 03/20/17 0429  TROPONINI <0.03   BNP: Invalid input(s): POCBNP CBG:  Recent Labs Lab 03/20/17 0841 03/20/17 1255 03/20/17 1746 03/20/17 2144 03/21/17 0817  GLUCAP 137* 147* 152* 173* 141*   D-Dimer No results for input(s): DDIMER in the last 72 hours. Hgb A1c No results for input(s): HGBA1C in the last 72 hours. Lipid Profile No results for input(s): CHOL, HDL, LDLCALC, TRIG, CHOLHDL, LDLDIRECT in the last 72 hours. Thyroid function studies  Recent Labs  03/20/17 0429  TSH 2.591   Anemia work up No results for input(s): VITAMINB12, FOLATE, FERRITIN, TIBC, IRON, RETICCTPCT in the last 72 hours. Urinalysis    Component Value Date/Time   COLORURINE STRAW (A) 03/20/2017 0137   APPEARANCEUR CLEAR 03/20/2017 0137   LABSPEC 1.010 03/20/2017 0137   PHURINE 6.0 03/20/2017 0137   GLUCOSEU NEGATIVE 03/20/2017 0137   HGBUR NEGATIVE 03/20/2017 0137   BILIRUBINUR NEGATIVE 03/20/2017 0137    KETONESUR NEGATIVE 03/20/2017 0137   PROTEINUR NEGATIVE 03/20/2017 0137   UROBILINOGEN 0.2 06/12/2011 1201   NITRITE NEGATIVE 03/20/2017 0137   LEUKOCYTESUR TRACE (A) 03/20/2017 0137   Sepsis Labs Invalid input(s): PROCALCITONIN,  WBC,  LACTICIDVEN   Time coordinating discharge: Over 30 minutes  SIGNED:   Janece Canterbury, MD  [facility] 03/21/2017, 2:36 PM Pager   If 7PM-7AM, please contact night-coverage [email or web address] Password [email or web address]

## 2017-03-21 NOTE — Progress Notes (Signed)
Anne Roach to be D/C'd Home per MD order.  Discussed with the patient and all questions fully answered.  VSS, IV catheter discontinued intact. Site without signs and symptoms of complications. Dressing and pressure applied.  An After Visit Summary was printed and given to the patient. Patient received prescription.  D/c education completed with patient/family including follow up instructions, medication list, d/c activities limitations if indicated, with other d/c instructions as indicated by MD - patient able to verbalize understanding, all questions fully answered.   Patient instructed to return to ED, call 911, or call MD for any changes in condition.   Patient escorted via [place], and D/C home via private auto.  [name] 03/21/2017 10:59 AM

## 2017-03-21 NOTE — Progress Notes (Signed)
ANTICOAGULATION CONSULT NOTE - Follow Up Consult  Pharmacy Consult for heparin>eliquis Indication: atrial fibrillation  No Known Allergies  Patient Measurements: Height: 5\' 6"  (167.6 cm) Weight: 179 lb 10.8 oz (81.5 kg) IBW/kg (Calculated) : 59.3  Vital Signs: Temp: 97.8 F (36.6 C) (05/05 0549) BP: 126/71 (05/05 0549) Pulse Rate: 79 (05/05 0549)  Labs:  Recent Labs  03/19/17 2313 03/19/17 2347 03/20/17 0429 03/20/17 1309 03/21/17 0000  HGB 12.4  --  11.8*  --   --   HCT 37.5  --  35.2*  --   --   PLT 186  --  149*  --   --   HEPARINUNFRC  --   --   --  <0.10* 0.41  CREATININE  --  1.27* 1.10*  --   --   TROPONINI  --   --  <0.03  --   --     Estimated Creatinine Clearance: 43.2 mL/min (A) (by C-G formula based on SCr of 1.1 mg/dL (H)).   Assessment: 83 yo female presenting for diarrhea and abdominal pain, with new found afib on admission. CHADSVASC = 5. Not on AC PTA. Patient initiated on heparin for atrial fibrillation, now to transition to eliquis. Last heparin level therapeutic. CBC stable, no bleeding noted per RN. Age >80, SCr <1.5, wt >60 kg.   Goal of Therapy:  Monitor platelets by anticoagulation protocol: Yes   Plan:  D/c heparin Start eliquis 5 mg PO BID Watch s/sx bleeding, clinical picture Will educate patient on new anticoagulant prior to discharge   Carlean Jews, Pharm.D. PGY1 Pharmacy Resident 5/5/20187:33 AM Pager [phone]

## 2017-03-21 NOTE — Progress Notes (Signed)
Pt was admitted from down steps per stretcher accompanied by two nurse tech and pt daughter, on arrival pt was fully alert and oriented self introduce to pt and family ID bracelet checked oriented to room and pt care equipment, hooked to tele and CCMD informed, heparin drip running at the rate of 12.5, iv lines checked all in good condition, vital signs are stable, skin checked with another nurse, only have some redness at her buttocks, fall prevention plan discussed with pt and family kept on bed alarm, prescribed treatment started and will continue to monitor pt

## 2017-03-21 NOTE — Progress Notes (Signed)
ANTICOAGULATION CONSULT NOTE - Follow-up Consult  Pharmacy Consult for heparin Indication: atrial fibrillation  No Known Allergies  Patient Measurements: Height: 5\' 6"  (167.6 cm) Weight: 173 lb 15.1 oz (78.9 kg) IBW/kg (Calculated) : 59.3 Heparin Dosing Weight: 72.2 Kg  Vital Signs: Temp: 98 F (36.7 C) (05/04 2146) Temp Source: Oral (05/04 1700) BP: 112/53 (05/04 2148) Pulse Rate: 75 (05/04 2148)  Labs:  Recent Labs  03/19/17 2313 03/19/17 2347 03/20/17 0429 03/20/17 1309 03/21/17 0000  HGB 12.4  --  11.8*  --   --   HCT 37.5  --  35.2*  --   --   PLT 186  --  149*  --   --   HEPARINUNFRC  --   --   --  <0.10* 0.41  CREATININE  --  1.27* 1.10*  --   --   TROPONINI  --   --  <0.03  --   --    Estimated Creatinine Clearance: 42.5 mL/min (A) (by C-G formula based on SCr of 1.1 mg/dL (H)).  Assessment: Anne Roach is a 83 yo female on heparin for atrial fibrillation. Heparin level therapeutic (0.41) on gtt at 1250 units/hr. No bleeding noted.  Goal of Therapy:  Heparin level 0.3-0.7 units/ml Monitor platelets by anticoagulation protocol: Yes   Plan:  Continue heparin infusion at 1250 units/hr Will f/u am HL to confirm therapeutic  Sherlon Handing, PharmD, BCPS Clinical pharmacist, pager [phone] 03/21/2017,1:15 AM

## 2017-03-24 DIAGNOSIS — E7800 Pure hypercholesterolemia, unspecified: Secondary | ICD-10-CM | POA: Diagnosis not present

## 2017-03-24 DIAGNOSIS — I4891 Unspecified atrial fibrillation: Secondary | ICD-10-CM | POA: Diagnosis not present

## 2017-03-24 DIAGNOSIS — E119 Type 2 diabetes mellitus without complications: Secondary | ICD-10-CM | POA: Diagnosis not present

## 2017-03-24 DIAGNOSIS — I10 Essential (primary) hypertension: Secondary | ICD-10-CM | POA: Diagnosis not present

## 2017-04-21 DIAGNOSIS — E7800 Pure hypercholesterolemia, unspecified: Secondary | ICD-10-CM | POA: Diagnosis not present

## 2017-04-21 DIAGNOSIS — E119 Type 2 diabetes mellitus without complications: Secondary | ICD-10-CM | POA: Diagnosis not present

## 2017-04-21 DIAGNOSIS — I10 Essential (primary) hypertension: Secondary | ICD-10-CM | POA: Diagnosis not present

## 2017-04-21 DIAGNOSIS — I4891 Unspecified atrial fibrillation: Secondary | ICD-10-CM | POA: Diagnosis not present

## 2017-04-21 DIAGNOSIS — D649 Anemia, unspecified: Secondary | ICD-10-CM | POA: Diagnosis not present

## 2017-04-21 DIAGNOSIS — R946 Abnormal results of thyroid function studies: Secondary | ICD-10-CM | POA: Diagnosis not present

## 2018-01-30 ENCOUNTER — Ambulatory Visit: Payer: Medicare HMO | Admitting: Physician Assistant

## 2018-04-19 ENCOUNTER — Ambulatory Visit (INDEPENDENT_AMBULATORY_CARE_PROVIDER_SITE_OTHER): Payer: Medicare HMO

## 2018-04-19 ENCOUNTER — Ambulatory Visit (INDEPENDENT_AMBULATORY_CARE_PROVIDER_SITE_OTHER): Payer: Medicare HMO | Admitting: Podiatry

## 2018-04-19 ENCOUNTER — Encounter: Payer: Self-pay | Admitting: Podiatry

## 2018-04-19 DIAGNOSIS — M659 Synovitis and tenosynovitis, unspecified: Secondary | ICD-10-CM

## 2018-04-21 NOTE — Progress Notes (Signed)
   Subjective:  84 year old female with PMHx of DM presenting today with a chief complaint of aching pain to the lateral, medial and anterior aspects of the left ankle that began gradually one week ago. She reports associated swelling of the area. Walking increases her symptoms. She has been wearing an ankle brace with no significant relief. Patient is here for further evaluation and treatment.   Past Medical History:  Diagnosis Date  . DM (diabetes mellitus) ([place])   . HTN (hypertension)   . Tremor     Objective / Physical Exam:  General:  The patient is alert and oriented x3 in no acute distress. Dermatology:  Skin is warm, dry and supple bilateral lower extremities. Negative for open lesions or macerations. Vascular:  Palpable pedal pulses bilaterally. No edema or erythema noted. Capillary refill within normal limits. Neurological:  Epicritic and protective threshold grossly intact bilaterally.  Musculoskeletal Exam:  Pain on palpation to the anterior lateral medial aspects of the patient's left ankle. Mild edema noted. Range of motion within normal limits to all pedal and ankle joints bilateral. Muscle strength 5/5 in all groups bilateral.   Radiographic Exam:  Normal osseous mineralization. Joint spaces preserved. No fracture/dislocation/boney destruction.    Assessment: 1. pain in left ankle 2. synovitis of left ankle  Plan of Care:  1. Patient was evaluated. X-Rays reviewed.  2. injection of 0.5 mL Celestone Soluspan injected in the patient's left ankle. 3. Cannot take oral NSAIDs due to medication reactions.  4. Compression anklet dispensed.  5. Return to clinic in 4 weeks.    Edrick Kins, DPM [facility]  Dr. Edrick Kins, [place]                                        [place],  [address]                Office [phone]  Fax [phone]

## 2018-04-30 ENCOUNTER — Other Ambulatory Visit: Payer: Self-pay | Admitting: Podiatry

## 2018-05-19 ENCOUNTER — Ambulatory Visit (INDEPENDENT_AMBULATORY_CARE_PROVIDER_SITE_OTHER): Payer: Medicare HMO | Admitting: Podiatry

## 2018-05-19 DIAGNOSIS — G609 Hereditary and idiopathic neuropathy, unspecified: Secondary | ICD-10-CM

## 2018-05-19 DIAGNOSIS — M659 Synovitis and tenosynovitis, unspecified: Secondary | ICD-10-CM

## 2018-05-19 MED ORDER — ALLOPURINOL 100 MG PO TABS: 100.0000 mg | ORAL_TABLET | Freq: Every day | ORAL | 6 refills | Status: DC

## 2018-05-19 MED ORDER — GABAPENTIN 100 MG PO CAPS: 100.0000 mg | ORAL_CAPSULE | Freq: Three times a day (TID) | ORAL | 3 refills | Status: DC

## 2018-05-26 NOTE — Progress Notes (Signed)
   HPI: 84 year old female presenting today for follow up evaluation of left ankle pain. She reports some mild continued pain. She has a new complaint of pain in the bilateral feet at night. There are no modifying factors noted. She states she needs a refill on her Allopurinol. Patient is here for further evaluation and treatment.   Past Medical History:  Diagnosis Date  . DM (diabetes mellitus) ([place])   . HTN (hypertension)   . Tremor      Physical Exam: General: The patient is alert and oriented x3 in no acute distress.  Dermatology: Skin is warm, dry and supple bilateral lower extremities. Negative for open lesions or macerations.  Vascular: Palpable pedal pulses bilaterally. No edema or erythema noted. Capillary refill within normal limits.  Neurological: Epicritic and protective threshold grossly intact bilaterally.   Musculoskeletal Exam: Range of motion within normal limits to all pedal and ankle joints bilateral. Muscle strength 5/5 in all groups bilateral.     Assessment: 1. DJD synovitis left ankle - resolved  2. Peripheral neuropathy BLE - nocturnal    Plan of Care:  1. Patient evaluated. 2. Prescription for gabapentin 100 mg QHS provided to patient.  3. Prescription for allopurinol 100 mg QD provided to patient.  4. Recommended good shoe gear.  5. Return to clinic as needed.       Edrick Kins, DPM [facility]  Dr. Edrick Kins, DPM    [address], [place] [address]                Office [phone]  Fax [phone]

## 2018-06-16 ENCOUNTER — Ambulatory Visit (INDEPENDENT_AMBULATORY_CARE_PROVIDER_SITE_OTHER): Payer: Medicare HMO | Admitting: Podiatry

## 2018-06-16 DIAGNOSIS — M10072 Idiopathic gout, left ankle and foot: Secondary | ICD-10-CM

## 2018-06-16 DIAGNOSIS — M659 Synovitis and tenosynovitis, unspecified: Secondary | ICD-10-CM | POA: Diagnosis not present

## 2018-06-16 MED ORDER — MELOXICAM 15 MG PO TABS: 15.0000 mg | ORAL_TABLET | Freq: Every day | ORAL | 1 refills | Status: DC

## 2018-06-16 MED ORDER — METHYLPREDNISOLONE 4 MG PO TBPK: ORAL_TABLET | ORAL | 0 refills | Status: DC

## 2018-06-22 NOTE — Progress Notes (Signed)
   Subjective:  84 year old female with PMHx of DM presenting today for follow up evaluation of left ankle pain. She states the pain has worsened significantly since her previous visit. She denies any trauma or injury. She has been taking Allopurinol as directed. She does admit to eating shellfish recently. Patient is here for further evaluation and treatment.   Past Medical History:  Diagnosis Date  . DM (diabetes mellitus) ([place])   . HTN (hypertension)   . Tremor      Objective / Physical Exam:  General:  The patient is alert and oriented x3 in no acute distress. Dermatology:  Skin is warm, dry and supple bilateral lower extremities. Negative for open lesions or macerations. Vascular:  Palpable pedal pulses bilaterally. No edema or erythema noted. Capillary refill within normal limits. Neurological:  Epicritic and protective threshold grossly intact bilaterally.  Musculoskeletal Exam:  Pain on palpation to the left ankle joint with erythema and edema. Muscle strength 5/5 in all groups bilateral.     Assessment: 1. pain in left ankle 2. Synovitis/acute gout of left ankle  Plan of Care:  1. Patient was evaluated. 2. injection of 0.5 mL Celestone Soluspan injected in the patient's left ankle. 3. Prescription for Medrol Dose Pak provided to patient.  4. Prescription for Meloxicam provided to patient.  5. Continue taking Allopurinol 100 mg.  6. Patient recently ate shellfish. Recommended adjusting diet and avoiding shellfish.  7. Return to clinic in 4 weeks.    Edrick Kins, DPM [facility]  Dr. Edrick Kins, [address]                                        [place], [place] [address]                Office [phone]  Fax [phone]

## 2018-07-14 ENCOUNTER — Ambulatory Visit (INDEPENDENT_AMBULATORY_CARE_PROVIDER_SITE_OTHER): Payer: Medicare HMO | Admitting: Podiatry

## 2018-07-14 ENCOUNTER — Telehealth: Payer: Self-pay | Admitting: *Deleted

## 2018-07-14 DIAGNOSIS — M722 Plantar fascial fibromatosis: Secondary | ICD-10-CM

## 2018-07-14 DIAGNOSIS — M19072 Primary osteoarthritis, left ankle and foot: Secondary | ICD-10-CM

## 2018-07-14 DIAGNOSIS — M659 Synovitis and tenosynovitis, unspecified: Secondary | ICD-10-CM | POA: Diagnosis not present

## 2018-07-14 DIAGNOSIS — M65979 Unspecified synovitis and tenosynovitis, unspecified ankle and foot: Secondary | ICD-10-CM

## 2018-07-14 DIAGNOSIS — R609 Edema, unspecified: Secondary | ICD-10-CM

## 2018-07-14 MED ORDER — MELOXICAM 15 MG PO TABS: 15.0000 mg | ORAL_TABLET | Freq: Every day | ORAL | 1 refills | Status: AC

## 2018-07-14 NOTE — Telephone Encounter (Signed)
Orders faxed to [facility]. 

## 2018-07-14 NOTE — Telephone Encounter (Signed)
-----   Message from Edrick Kins, DPM sent at 07/14/2018 12:03 PM EDT ----- Regarding: LT ankle MRI Please order mri left ankle w/out contrast.  Dx: severe ankle pain / swelling.   Thanks, Dr. Ellard Artis

## 2018-07-14 NOTE — Telephone Encounter (Signed)
[place], "THIS [place] MEDICAID MEMBER DOES NOT REQUIRE PRIOR AUTHORIZATION FOR OUTPAINTENT RADIOLOGY THROUGH [facility] OR [facility] AT THIS TIME."

## 2018-07-17 NOTE — Progress Notes (Signed)
   Subjective:  84 year old female with PMHx of DM presenting today for follow up evaluation of left ankle pain. She states the pain has improved but is still present. She is now able to bear weight. She has been taking Allopurinol and Meloxicam as directed. Patient is here for further evaluation and treatment.   Past Medical History:  Diagnosis Date  . DM (diabetes mellitus) ([place])   . HTN (hypertension)   . Tremor      Objective / Physical Exam:  General:  The patient is alert and oriented x3 in no acute distress. Dermatology:  Skin is warm, dry and supple bilateral lower extremities. Negative for open lesions or macerations. Vascular:  Palpable pedal pulses bilaterally. No edema or erythema noted. Capillary refill within normal limits. Neurological:  Epicritic and protective threshold grossly intact bilaterally.  Musculoskeletal Exam:  Pain on palpation to the left ankle joint with erythema and edema. Tenderness to palpation to the plantar aspect of the left heel along the plantar fascia. Muscle strength 5/5 in all groups bilateral.     Assessment: 1. Synovitis/acute gout of left ankle 2. Plantar fasciitis left   Plan of Care:  1. Patient was evaluated. 2. injection of 0.5 mL Celestone Soluspan injected in the left heel.  3. Continue taking Allopurinol daily.  4. Continue taking Meloxicam daily.  5. Order for MRI of the left ankle placed.  6. Plantar fascial brace dispensed.  7. Return to clinic in 4 weeks to review MRI results.    Edrick Kins, DPM [facility]  Dr. Edrick Kins, [place]                                        [place], [place] [address]                Office [phone]  Fax [phone]

## 2018-08-02 ENCOUNTER — Other Ambulatory Visit: Payer: Self-pay

## 2018-08-11 ENCOUNTER — Encounter: Payer: Medicare HMO | Admitting: Podiatry

## 2018-08-15 NOTE — Progress Notes (Signed)
This encounter was created in error - please disregard.

## 2018-12-30 ENCOUNTER — Other Ambulatory Visit: Payer: Self-pay

## 2018-12-30 ENCOUNTER — Emergency Department (HOSPITAL_BASED_OUTPATIENT_CLINIC_OR_DEPARTMENT_OTHER)
Admit: 2018-12-30 | Discharge: 2018-12-30 | Disposition: A | Discharge status: Home / Self Care | Payer: Medicare HMO | Attending: Emergency Medicine | Admitting: Emergency Medicine

## 2018-12-30 ENCOUNTER — Emergency Department (HOSPITAL_COMMUNITY): Payer: Medicare HMO

## 2018-12-30 ENCOUNTER — Encounter (HOSPITAL_COMMUNITY): Payer: Self-pay | Admitting: Emergency Medicine

## 2018-12-30 ENCOUNTER — Emergency Department (HOSPITAL_COMMUNITY)
Admission: EM | Admit: 2018-12-30 | Discharge: 2018-12-30 | Disposition: A | Discharge status: Home / Self Care | Payer: Medicare HMO | Source: Home / Self Care | Attending: Emergency Medicine | Admitting: Emergency Medicine

## 2018-12-30 DIAGNOSIS — I4891 Unspecified atrial fibrillation: Secondary | ICD-10-CM | POA: Insufficient documentation

## 2018-12-30 DIAGNOSIS — R079 Chest pain, unspecified: Secondary | ICD-10-CM

## 2018-12-30 DIAGNOSIS — R509 Fever, unspecified: Secondary | ICD-10-CM | POA: Insufficient documentation

## 2018-12-30 DIAGNOSIS — R2243 Localized swelling, mass and lump, lower limb, bilateral: Secondary | ICD-10-CM | POA: Insufficient documentation

## 2018-12-30 DIAGNOSIS — E119 Type 2 diabetes mellitus without complications: Secondary | ICD-10-CM

## 2018-12-30 DIAGNOSIS — Z9119 Patient's noncompliance with other medical treatment and regimen: Secondary | ICD-10-CM | POA: Insufficient documentation

## 2018-12-30 DIAGNOSIS — Z79899 Other long term (current) drug therapy: Secondary | ICD-10-CM | POA: Insufficient documentation

## 2018-12-30 DIAGNOSIS — M7989 Other specified soft tissue disorders: Secondary | ICD-10-CM

## 2018-12-30 DIAGNOSIS — Z7901 Long term (current) use of anticoagulants: Secondary | ICD-10-CM | POA: Insufficient documentation

## 2018-12-30 DIAGNOSIS — I10 Essential (primary) hypertension: Secondary | ICD-10-CM | POA: Insufficient documentation

## 2018-12-30 DIAGNOSIS — Z7984 Long term (current) use of oral hypoglycemic drugs: Secondary | ICD-10-CM | POA: Insufficient documentation

## 2018-12-30 DIAGNOSIS — R0789 Other chest pain: Secondary | ICD-10-CM | POA: Insufficient documentation

## 2018-12-30 LAB — CBC
HCT: 37.4 % (ref 36.0–46.0)
Hemoglobin: 11.9 g/dL — ABNORMAL LOW (ref 12.0–15.0)
MCH: 29.3 pg (ref 26.0–34.0)
MCHC: 31.8 g/dL (ref 30.0–36.0)
MCV: 92.1 fL (ref 80.0–100.0)
Platelets: 172 10*3/uL (ref 150–400)
RBC: 4.06 MIL/uL (ref 3.87–5.11)
RDW: 13.4 % (ref 11.5–15.5)
WBC: 7.5 10*3/uL (ref 4.0–10.5)
nRBC: 0 % (ref 0.0–0.2)

## 2018-12-30 LAB — I-STAT TROPONIN, ED: Troponin i, poc: 0 ng/mL (ref 0.00–0.08)

## 2018-12-30 LAB — BASIC METABOLIC PANEL
Anion gap: 11 (ref 5–15)
BUN: 13 mg/dL (ref 8–23)
CALCIUM: 8.6 mg/dL — AB (ref 8.9–10.3)
CO2: 22 mmol/L (ref 22–32)
Chloride: 106 mmol/L (ref 98–111)
Creatinine, Ser: 1.39 mg/dL — ABNORMAL HIGH (ref 0.44–1.00)
GFR calc Af Amer: 41 mL/min — ABNORMAL LOW (ref >60)
GFR calc non Af Amer: 35 mL/min — ABNORMAL LOW (ref >60)
Glucose, Bld: 135 mg/dL — ABNORMAL HIGH (ref 70–99)
Potassium: 3.8 mmol/L (ref 3.5–5.1)
Sodium: 139 mmol/L (ref 135–145)

## 2018-12-30 LAB — TROPONIN I: Troponin I: <0.03 ng/mL (ref <0.03)

## 2018-12-30 MED ORDER — APIXABAN 5 MG PO TABS: 5.0000 mg | ORAL_TABLET | Freq: Two times a day (BID) | ORAL | 0 refills | Status: DC

## 2018-12-30 MED ORDER — SODIUM CHLORIDE 0.9 % IV BOLUS
1000.0000 mL | Freq: Once | INTRAVENOUS | Status: AC
  Administered 2018-12-30: 1000 mL via INTRAVENOUS

## 2018-12-30 NOTE — ED Notes (Signed)
ED Provider at bedside. 

## 2018-12-30 NOTE — ED Triage Notes (Signed)
Pt in from home via [facility] with squeezing L cp that began at 0200 this am. Given 324ASA and 1NTG en route. States pain resolved with the nitro. C/o some nausea since yesterday and temp 99.8 oral

## 2018-12-30 NOTE — ED Notes (Signed)
Patient verbalizes understanding of discharge instructions. Opportunity for questioning and answers were provided. Armband removed by staff, pt discharged from ED. Wheeled out to lobby  

## 2018-12-30 NOTE — Discharge Instructions (Addendum)
You are seen in the emergency department for an episode of chest pain.  You had blood work EKG and a chest x-ray that did not show an obvious cause of your pain.  You also had ultrasounds of your lower extremities that showed no signs of blood clot.  You were in atrial fibrillation and this was noted back in 2018.  We are prescribing you some blood thinners but it will be important that you contact your primary care doctor to get their opinion on this.  Please return if any worsening symptoms.

## 2018-12-30 NOTE — Progress Notes (Signed)
LE venous duplex       has been completed. Preliminary results can be found under CV proc through chart review. Adin Laker, BS, RDMS, RVT   

## 2018-12-30 NOTE — ED Provider Notes (Signed)
[facility] EMERGENCY DEPARTMENT Provider Note   CSN: [id number] Arrival date & time: 12/30/18  1012     History   Chief Complaint Chief Complaint  Patient presents with  . Chest Pain    HPI Anne Roach is a 85 y.o. female.  She is primarily Arabic speaking but speaks good [place].  She said she woke up with left-sided chest pressure around 2 AM.  It was associated with some nausea.  Ultimately she called EMS around 930 this morning.  They gave her 4 baby aspirin and 1 nitro with improvement in her symptoms.  She said the pain was moderate to severe in the beginning but is gone now.  Yesterday she felt this cold and has a low-grade temp here today.  No shortness of breath no vomiting no diarrhea no urinary symptoms.  The history is provided by the patient.  Chest Pain  Pain location:  L chest Pain quality: pressure   Pain radiates to:  Does not radiate Pain severity:  Moderate Onset quality:  Sudden Duration:  7 hours Timing:  Constant Progression:  Resolved Chronicity:  New Context: at rest   Relieved by:  Nitroglycerin and aspirin Worsened by:  Nothing Ineffective treatments:  None tried Associated symptoms: fever and nausea   Associated symptoms: no abdominal pain, no back pain, no cough, no diaphoresis, no dizziness, no headache, no numbness, no shortness of breath, no vomiting and no weakness   Risk factors: diabetes mellitus and hypertension     Past Medical History:  Diagnosis Date  . DM (diabetes mellitus) ([place])   . HTN (hypertension)   . Tremor     Patient Active Problem List   Diagnosis Date Noted  . Colitis 03/20/2017  . Atrial fibrillation with RVR ([place]) 03/20/2017  . HTN (hypertension)   . Tremor   . OSA (obstructive sleep apnea) 06/29/2012  . DM (diabetes mellitus) ([place])   . Essential hypertension 03/10/2009  . LUNG NODULE 03/10/2009  . SHORTNESS OF BREATH 03/10/2009  . CHEST PAIN 03/10/2009    Past Surgical History:    Procedure Laterality Date  . APPENDECTOMY    . CARPAL TUNNEL RELEASE    . CATARACT EXTRACTION Bilateral   . HERNIA REPAIR       OB History   No obstetric history on file.      Home Medications    Prior to Admission medications   Medication Sig Start Date End Date Taking? Authorizing Provider  allopurinol (ZYLOPRIM) 100 MG tablet Take 1 tablet (100 mg total) by mouth daily. 05/19/18   Edrick Kins, DPM  amLODipine (NORVASC) 5 MG tablet TK 1 T PO QD 03/18/18   [provider]  apixaban (ELIQUIS) 5 MG TABS tablet Take 1 tablet (5 mg total) by mouth 2 (two) times daily. 03/21/17   Janece Canterbury, MD  colchicine 0.6 MG tablet Take 1 tablet (0.6 mg total) by mouth daily. 01/14/17   Edrick Kins, DPM  gabapentin (NEURONTIN) 100 MG capsule Take 1 capsule (100 mg total) by mouth 3 (three) times daily. 05/19/18   Edrick Kins, DPM  irbesartan-hydrochlorothiazide (AVALIDE) 150-12.5 MG tablet  03/30/18   [provider]  memantine (NAMENDA) 10 MG tablet Take 10 mg by mouth daily.  08/17/14   [provider]  metFORMIN (GLUCOPHAGE-XR) 750 MG 24 hr tablet Take 750 mg by mouth daily after supper. 05/14/15   [provider]  methylPREDNISolone (MEDROL DOSEPAK) 4 MG TBPK tablet 6 day dose  pack - take as directed 06/16/18   Edrick Kins, DPM  metroNIDAZOLE (FLAGYL) 500 MG tablet Take 1 tablet (500 mg total) by mouth 3 (three) times daily. 03/21/17   Janece Canterbury, MD  Multiple Vitamins-Minerals (ICAPS AREDS 2 PO) Take 1 tablet by mouth daily.    [provider]  Omega 3-6-9 Fatty Acids (TRIPLE OMEGA COMPLEX PO) Take 1 tablet by mouth daily.    [provider]    Family History Family History  Problem Relation Age of Onset  . Diabetes Father   . Asthma Brother     Social History Social History   Tobacco Use  . Smoking status: Never Smoker  . Smokeless tobacco: Never Used  Substance Use Topics  . Alcohol use: No    Alcohol/week: 0.0  standard drinks  . Drug use: No     Allergies   Patient has no known allergies.   Review of Systems Review of Systems  Constitutional: Positive for fever. Negative for diaphoresis.  HENT: Negative for sore throat.   Eyes: Negative for visual disturbance.  Respiratory: Negative for cough and shortness of breath.   Cardiovascular: Positive for chest pain.  Gastrointestinal: Positive for nausea. Negative for abdominal pain and vomiting.  Genitourinary: Negative for dysuria.  Musculoskeletal: Negative for back pain.  Skin: Negative for rash.  Neurological: Negative for dizziness, weakness, numbness and headaches.     Physical Exam Updated Vital Signs BP 111/66   Pulse (!) 112   Temp 99.8 F (37.7 C) (Oral)   Resp (!) 23   Wt 81.5 kg   SpO2 94%   BMI 29.00 kg/m   Physical Exam Vitals signs and nursing note reviewed.  Constitutional:      General: She is not in acute distress.    Appearance: She is well-developed.  HENT:     Head: Normocephalic and atraumatic.  Eyes:     Conjunctiva/sclera: Conjunctivae normal.  Neck:     Musculoskeletal: Neck supple.  Cardiovascular:     Rate and Rhythm: Regular rhythm. Tachycardia present.     Heart sounds: No murmur.  Pulmonary:     Effort: Pulmonary effort is normal. No respiratory distress.     Breath sounds: Normal breath sounds.  Abdominal:     Palpations: Abdomen is soft.     Tenderness: There is no abdominal tenderness.  Musculoskeletal: Normal range of motion.     Right lower leg: She exhibits no tenderness. No edema.     Left lower leg: She exhibits no tenderness. No edema.  Skin:    General: Skin is warm and dry.     Capillary Refill: Capillary refill takes less than 2 seconds.  Neurological:     General: No focal deficit present.     Mental Status: She is alert and oriented to person, place, and time.     Motor: No weakness.      ED Treatments / Results  Labs (all labs ordered are listed, but only  abnormal results are displayed) Labs Reviewed  BASIC METABOLIC PANEL - Abnormal; Notable for the following components:      Result Value   Glucose, Bld 135 (*)    Creatinine, Ser 1.39 (*)    Calcium 8.6 (*)    GFR calc non Af Amer 35 (*)    GFR calc Af Amer 41 (*)    All other components within normal limits  CBC - Abnormal; Notable for the following components:   Hemoglobin 11.9 (*)  All other components within normal limits  TROPONIN I  I-STAT TROPONIN, ED    EKG EKG Interpretation  Date/Time:  Thursday December 30 2018 10:14:04 EST Ventricular Rate:  112 PR Interval:    QRS Duration: 91 QT Interval:  375 QTC Calculation: 512 R Axis:   -22 Text Interpretation:  Sinus tach with 1st AVB Borderline left axis deviation Low voltage, precordial leads Borderline repolarization abnormality Prolonged QT interval Baseline wander in lead(s) V5 Confirmed by Aletta Edouard [phone]) on 12/30/2018 10:20:40 AM   Radiology Dg Chest Port 1 View  Result Date: 12/30/2018 CLINICAL DATA:  Atrial fibrillation.  Chest pain. EXAM: PORTABLE CHEST 1 VIEW COMPARISON:  03/19/2017 FINDINGS: Artifact overlies the chest. Chronic cardiomegaly and aortic atherosclerosis. Pulmonary vascularity is normal. Lungs are clear. No effusions. No significant bone finding. IMPRESSION: No active disease.  Cardiomegaly and aortic atherosclerosis. Electronically Signed   By: Nelson Chimes M.D.   On: 12/30/2018 11:18   Vas [place] Lower Extremity Venous (dvt) (only Mc & Wl 7a-7p)  Result Date: 12/30/2018  Lower Venous Study Indications: Swelling.  Performing Technologist: June Leap RDMS, RVT  Examination Guidelines: A complete evaluation includes B-mode imaging, spectral Doppler, color Doppler, and power Doppler as needed of all accessible portions of each vessel. Bilateral testing is considered an integral part of a complete examination. Limited examinations for reoccurring indications may be performed as noted.  Right Venous  Findings: +---------+---------------+---------+-----------+----------+-------+          CompressibilityPhasicitySpontaneityPropertiesSummary +---------+---------------+---------+-----------+----------+-------+ CFV      Full           Yes      Yes                          +---------+---------------+---------+-----------+----------+-------+ SFJ      Full                                                 +---------+---------------+---------+-----------+----------+-------+ FV Prox  Full                                                 +---------+---------------+---------+-----------+----------+-------+ FV Mid   Full                                                 +---------+---------------+---------+-----------+----------+-------+ FV DistalFull                                                 +---------+---------------+---------+-----------+----------+-------+ PFV      Full                                                 +---------+---------------+---------+-----------+----------+-------+ POP                     Yes  Yes                          +---------+---------------+---------+-----------+----------+-------+ PTV      Full                                                 +---------+---------------+---------+-----------+----------+-------+ PERO     Full                                                 +---------+---------------+---------+-----------+----------+-------+  Left Venous Findings: +---------+---------------+---------+-----------+----------+-------+          CompressibilityPhasicitySpontaneityPropertiesSummary +---------+---------------+---------+-----------+----------+-------+ CFV      Full           Yes      Yes                          +---------+---------------+---------+-----------+----------+-------+ SFJ      Full                                                  +---------+---------------+---------+-----------+----------+-------+ FV Prox  Full                                                 +---------+---------------+---------+-----------+----------+-------+ FV Mid   Full                                                 +---------+---------------+---------+-----------+----------+-------+ FV DistalFull                                                 +---------+---------------+---------+-----------+----------+-------+ PFV      Full                                                 +---------+---------------+---------+-----------+----------+-------+ POP                     Yes      Yes                          +---------+---------------+---------+-----------+----------+-------+ PTV      Full                                                 +---------+---------------+---------+-----------+----------+-------+ PERO     Full                                                 +---------+---------------+---------+-----------+----------+-------+  Summary: Right: There is no evidence of deep vein thrombosis in the lower extremity. No cystic structure found in the popliteal fossa. Left: There is no evidence of deep vein thrombosis in the lower extremity. No cystic structure found in the popliteal fossa.  *See table(s) above for measurements and observations. Electronically signed by Monica Martinez MD on 12/30/2018 at 2:57:48 PM.    Final     Procedures Procedures (including critical care time)  Medications Ordered in ED Medications  sodium chloride 0.9 % bolus 1,000 mL (0 mLs Intravenous Stopped 12/30/18 1318)     Initial Impression / Assessment and Plan / ED Course  I have reviewed the triage vital signs and the nursing notes.  Pertinent labs & imaging results that were available during my care of the patient were reviewed by me and considered in my medical decision making (see chart for details).  Clinical Course as of Dec 30 1830  Thu Feb 13, [address]  595 85 year old female diabetic with no prior history of coronary disease here with chest pressure since 2 AM last night.  Her daughter is here now and said her blood pressures been low and she is been dizzy for a few weeks and her doctor recently discontinued her amlodipine because her blood pressure was low in the office.  Last night she talked to her mom who is experiencing her heart beating strongly.  She recommended that she take her blood pressure and take her amlodipine.  Is unclear whether this is the chest pain that the patient was explaining to me.   [MB]  1056 Cardiac echo 5/18 - Impressions:  - LVEF 60-65%, mild LVH, normal wall motion, mild MR, mild LAE,   trivial TR, RVSP 16 mmHg, normal IVC, small pericardial effusion   without tamponade physiology.   [MB]  7829 See discharge note from 5/18 of the patient having new onset A. fib and going on Eliquis.  The daughter does not recall that the patient is on a blood thinner.  Of asked pharmacy to see if they can reconcile the patient's meds.   [MB]  1420 DVT studies negative bilaterally.  Preliminary reading.  I reviewed with the patient's daughter her testing has been negative so far.  She is still pending a repeat troponin.  Her daughter said she is been feeling a little more depressed recently at the one-year anniversary of her daughter's death.   [MB]  5621 Patient's pain is been resolved since she got here.  She is had 2 troponins and a negative bilateral duplex.  She and her daughter feel comfortable going home and they have a primary care doctor to follow-up with.  They understand to return if any worsening symptoms.   [MB]  3086 Patient has been diagnosed with A. fib as least as of 5/18.  She was discharged at that time on apixaban 5 mg twice daily.  Daughter has no idea when she stopped taking it but it is been months at least as far as pharmacy can figure out.  I am not sure if there is a reason why she  is not taking it, may be discussion with her primary care doctor of risks and benefits.  We agreed that I will send her home with a prescription and she will contact the PCP and started if they recommend.   [MB]    Clinical Course User Index [MB] Hayden Rasmussen, MD  CHA2DS2/VAS Stroke Risk Points  Current as of 27 minutes ago  5 >= 2 Points: High Risk  1 - 1.99 Points: Medium Risk  0 Points: Low Risk    This is the only CHA2DS2/VAS Stroke Risk Points available for the past  year.:  Last Change: N/A     Details    This score determines the patient's risk of having a stroke if the  patient has atrial fibrillation.       Points Metrics  0 Has Congestive Heart Failure:  No    Current as of 27 minutes ago  0 Has Vascular Disease:  No    Current as of 27 minutes ago  1 Has Hypertension:  Yes    Current as of 27 minutes ago  2 Age:  46    Current as of 27 minutes ago  1 Has Diabetes:  Yes    Current as of 27 minutes ago  0 Had Stroke:  No  Had TIA:  No  Had thromboembolism:  No    Current as of 27 minutes ago  1 Female:  Yes    Current as of 27 minutes ago           Final Clinical Impressions(s) / ED Diagnoses   Final diagnoses:  Nonspecific chest pain  Atrial fibrillation, unspecified type [facility])    ED Discharge Orders         Ordered    apixaban (ELIQUIS) 5 MG TABS tablet  2 times daily     12/30/18 1523           Hayden Rasmussen, MD 12/30/18 [phone]

## 2019-01-01 ENCOUNTER — Inpatient Hospital Stay (HOSPITAL_COMMUNITY)
Admission: EM | Admit: 2019-01-01 | Discharge: 2019-01-07 | DRG: 871 | Disposition: A | Discharge status: Home Health | Payer: Medicare HMO | Attending: Family Medicine | Admitting: Family Medicine

## 2019-01-01 ENCOUNTER — Encounter (HOSPITAL_COMMUNITY): Payer: Self-pay | Admitting: Emergency Medicine

## 2019-01-01 ENCOUNTER — Other Ambulatory Visit: Payer: Self-pay

## 2019-01-01 ENCOUNTER — Emergency Department (HOSPITAL_COMMUNITY): Payer: Medicare HMO

## 2019-01-01 DIAGNOSIS — E861 Hypovolemia: Secondary | ICD-10-CM | POA: Diagnosis present

## 2019-01-01 DIAGNOSIS — E8809 Other disorders of plasma-protein metabolism, not elsewhere classified: Secondary | ICD-10-CM | POA: Diagnosis present

## 2019-01-01 DIAGNOSIS — E1122 Type 2 diabetes mellitus with diabetic chronic kidney disease: Secondary | ICD-10-CM | POA: Diagnosis present

## 2019-01-01 DIAGNOSIS — E860 Dehydration: Secondary | ICD-10-CM | POA: Diagnosis present

## 2019-01-01 DIAGNOSIS — I480 Paroxysmal atrial fibrillation: Secondary | ICD-10-CM | POA: Diagnosis present

## 2019-01-01 DIAGNOSIS — A419 Sepsis, unspecified organism: Principal | ICD-10-CM | POA: Diagnosis present

## 2019-01-01 DIAGNOSIS — E119 Type 2 diabetes mellitus without complications: Secondary | ICD-10-CM | POA: Diagnosis not present

## 2019-01-01 DIAGNOSIS — I482 Chronic atrial fibrillation, unspecified: Secondary | ICD-10-CM | POA: Diagnosis present

## 2019-01-01 DIAGNOSIS — J9601 Acute respiratory failure with hypoxia: Secondary | ICD-10-CM | POA: Diagnosis not present

## 2019-01-01 DIAGNOSIS — R8271 Bacteriuria: Secondary | ICD-10-CM | POA: Insufficient documentation

## 2019-01-01 DIAGNOSIS — Z7984 Long term (current) use of oral hypoglycemic drugs: Secondary | ICD-10-CM

## 2019-01-01 DIAGNOSIS — N183 Chronic kidney disease, stage 3 unspecified: Secondary | ICD-10-CM | POA: Diagnosis present

## 2019-01-01 DIAGNOSIS — R54 Age-related physical debility: Secondary | ICD-10-CM | POA: Diagnosis present

## 2019-01-01 DIAGNOSIS — I9589 Other hypotension: Secondary | ICD-10-CM | POA: Diagnosis not present

## 2019-01-01 DIAGNOSIS — I129 Hypertensive chronic kidney disease with stage 1 through stage 4 chronic kidney disease, or unspecified chronic kidney disease: Secondary | ICD-10-CM | POA: Diagnosis present

## 2019-01-01 DIAGNOSIS — A045 Campylobacter enteritis: Secondary | ICD-10-CM | POA: Diagnosis present

## 2019-01-01 DIAGNOSIS — Z6828 Body mass index (BMI) 28.0-28.9, adult: Secondary | ICD-10-CM

## 2019-01-01 DIAGNOSIS — Z79899 Other long term (current) drug therapy: Secondary | ICD-10-CM

## 2019-01-01 DIAGNOSIS — B349 Viral infection, unspecified: Secondary | ICD-10-CM | POA: Diagnosis present

## 2019-01-01 DIAGNOSIS — E8770 Fluid overload, unspecified: Secondary | ICD-10-CM | POA: Diagnosis not present

## 2019-01-01 DIAGNOSIS — E869 Volume depletion, unspecified: Secondary | ICD-10-CM

## 2019-01-01 DIAGNOSIS — I10 Essential (primary) hypertension: Secondary | ICD-10-CM | POA: Diagnosis present

## 2019-01-01 DIAGNOSIS — R630 Anorexia: Secondary | ICD-10-CM | POA: Diagnosis present

## 2019-01-01 DIAGNOSIS — E1159 Type 2 diabetes mellitus with other circulatory complications: Secondary | ICD-10-CM | POA: Diagnosis not present

## 2019-01-01 DIAGNOSIS — D72825 Bandemia: Secondary | ICD-10-CM | POA: Diagnosis not present

## 2019-01-01 DIAGNOSIS — E876 Hypokalemia: Secondary | ICD-10-CM | POA: Diagnosis present

## 2019-01-01 DIAGNOSIS — R002 Palpitations: Secondary | ICD-10-CM | POA: Diagnosis present

## 2019-01-01 DIAGNOSIS — D631 Anemia in chronic kidney disease: Secondary | ICD-10-CM | POA: Diagnosis present

## 2019-01-01 DIAGNOSIS — R5383 Other fatigue: Secondary | ICD-10-CM | POA: Diagnosis not present

## 2019-01-01 DIAGNOSIS — I4891 Unspecified atrial fibrillation: Secondary | ICD-10-CM | POA: Diagnosis present

## 2019-01-01 DIAGNOSIS — I959 Hypotension, unspecified: Secondary | ICD-10-CM | POA: Diagnosis present

## 2019-01-01 DIAGNOSIS — N1832 Chronic kidney disease, stage 3b: Secondary | ICD-10-CM | POA: Diagnosis present

## 2019-01-01 DIAGNOSIS — N390 Urinary tract infection, site not specified: Secondary | ICD-10-CM | POA: Diagnosis present

## 2019-01-01 DIAGNOSIS — Z833 Family history of diabetes mellitus: Secondary | ICD-10-CM | POA: Diagnosis not present

## 2019-01-01 DIAGNOSIS — E8771 Transfusion associated circulatory overload: Secondary | ICD-10-CM | POA: Diagnosis not present

## 2019-01-01 DIAGNOSIS — N179 Acute kidney failure, unspecified: Secondary | ICD-10-CM | POA: Diagnosis present

## 2019-01-01 DIAGNOSIS — R0600 Dyspnea, unspecified: Secondary | ICD-10-CM

## 2019-01-01 DIAGNOSIS — Z7901 Long term (current) use of anticoagulants: Secondary | ICD-10-CM

## 2019-01-01 DIAGNOSIS — K529 Noninfective gastroenteritis and colitis, unspecified: Secondary | ICD-10-CM | POA: Diagnosis not present

## 2019-01-01 DIAGNOSIS — G4733 Obstructive sleep apnea (adult) (pediatric): Secondary | ICD-10-CM | POA: Diagnosis present

## 2019-01-01 DIAGNOSIS — R0689 Other abnormalities of breathing: Secondary | ICD-10-CM

## 2019-01-01 DIAGNOSIS — R0602 Shortness of breath: Secondary | ICD-10-CM

## 2019-01-01 DIAGNOSIS — I361 Nonrheumatic tricuspid (valve) insufficiency: Secondary | ICD-10-CM | POA: Diagnosis not present

## 2019-01-01 DIAGNOSIS — R42 Dizziness and giddiness: Secondary | ICD-10-CM

## 2019-01-01 DIAGNOSIS — R0902 Hypoxemia: Secondary | ICD-10-CM

## 2019-01-01 LAB — I-STAT TROPONIN, ED: Troponin i, poc: 0 ng/mL (ref 0.00–0.08)

## 2019-01-01 LAB — CBC WITH DIFFERENTIAL/PLATELET
Abs Immature Granulocytes: 0.03 10*3/uL (ref 0.00–0.07)
BASOS ABS: 0 10*3/uL (ref 0.0–0.1)
Basophils Relative: 0 %
Eosinophils Absolute: 0 10*3/uL (ref 0.0–0.5)
Eosinophils Relative: 0 %
HCT: 35.6 % — ABNORMAL LOW (ref 36.0–46.0)
Hemoglobin: 11.2 g/dL — ABNORMAL LOW (ref 12.0–15.0)
Immature Granulocytes: 0 %
Lymphocytes Relative: 17 %
Lymphs Abs: 1.3 10*3/uL (ref 0.7–4.0)
MCH: 28.9 pg (ref 26.0–34.0)
MCHC: 31.5 g/dL (ref 30.0–36.0)
MCV: 91.8 fL (ref 80.0–100.0)
Monocytes Absolute: 0.7 10*3/uL (ref 0.1–1.0)
Monocytes Relative: 9 %
NRBC: 0 % (ref 0.0–0.2)
Neutro Abs: 5.4 10*3/uL (ref 1.7–7.7)
Neutrophils Relative %: 74 %
Platelets: 140 10*3/uL — ABNORMAL LOW (ref 150–400)
RBC: 3.88 MIL/uL (ref 3.87–5.11)
RDW: 13.5 % (ref 11.5–15.5)
WBC: 7.4 10*3/uL (ref 4.0–10.5)

## 2019-01-01 LAB — URINALYSIS, ROUTINE W REFLEX MICROSCOPIC
Bilirubin Urine: NEGATIVE
Glucose, UA: NEGATIVE mg/dL
Ketones, ur: NEGATIVE mg/dL
Leukocytes,Ua: NEGATIVE
NITRITE: NEGATIVE
Protein, ur: NEGATIVE mg/dL
Specific Gravity, Urine: 1.003 — ABNORMAL LOW (ref 1.005–1.030)
pH: 6 (ref 5.0–8.0)

## 2019-01-01 LAB — COMPREHENSIVE METABOLIC PANEL
ALT: 14 U/L (ref 0–44)
AST: 22 U/L (ref 15–41)
Albumin: 3 g/dL — ABNORMAL LOW (ref 3.5–5.0)
Alkaline Phosphatase: 28 U/L — ABNORMAL LOW (ref 38–126)
Anion gap: 10 (ref 5–15)
BUN: 13 mg/dL (ref 8–23)
CO2: 23 mmol/L (ref 22–32)
Calcium: 8.4 mg/dL — ABNORMAL LOW (ref 8.9–10.3)
Chloride: 102 mmol/L (ref 98–111)
Creatinine, Ser: 1.48 mg/dL — ABNORMAL HIGH (ref 0.44–1.00)
GFR calc Af Amer: 38 mL/min — ABNORMAL LOW (ref >60)
GFR calc non Af Amer: 32 mL/min — ABNORMAL LOW (ref >60)
Glucose, Bld: 133 mg/dL — ABNORMAL HIGH (ref 70–99)
POTASSIUM: 3.5 mmol/L (ref 3.5–5.1)
Sodium: 135 mmol/L (ref 135–145)
Total Bilirubin: 0.8 mg/dL (ref 0.3–1.2)
Total Protein: 6.4 g/dL — ABNORMAL LOW (ref 6.5–8.1)

## 2019-01-01 LAB — LACTIC ACID, PLASMA
Lactic Acid, Venous: 2.6 mmol/L (ref 0.5–1.9)
Lactic Acid, Venous: 1.9 mmol/L (ref 0.5–1.9)

## 2019-01-01 LAB — INFLUENZA PANEL BY PCR (TYPE A & B)
Influenza A By PCR: NEGATIVE
Influenza B By PCR: NEGATIVE

## 2019-01-01 LAB — LIPASE, BLOOD: Lipase: 27 U/L (ref 11–51)

## 2019-01-01 LAB — MAGNESIUM: Magnesium: 1.4 mg/dL — ABNORMAL LOW (ref 1.7–2.4)

## 2019-01-01 LAB — PROTIME-INR
INR: 1.25
Prothrombin Time: 15.5 seconds — ABNORMAL HIGH (ref 11.4–15.2)

## 2019-01-01 MED ORDER — ACETAMINOPHEN 325 MG PO TABS
650.0000 mg | ORAL_TABLET | ORAL | Status: DC | PRN
  Administered 2019-01-01 – 2019-01-03 (×4): 650 mg via ORAL
  Filled 2019-01-01 (×4): qty 2

## 2019-01-01 MED ORDER — SODIUM CHLORIDE 0.9 % IV BOLUS
1000.0000 mL | Freq: Once | INTRAVENOUS | Status: AC
  Administered 2019-01-01: 1000 mL via INTRAVENOUS

## 2019-01-01 MED ORDER — LOPERAMIDE HCL 2 MG PO CAPS
2.0000 mg | ORAL_CAPSULE | ORAL | Status: DC | PRN
  Administered 2019-01-02: 2 mg via ORAL
  Filled 2019-01-01: qty 1

## 2019-01-01 MED ORDER — SODIUM CHLORIDE 0.9% FLUSH: 3.0000 mL | Freq: Once | INTRAVENOUS | Status: DC

## 2019-01-01 MED ORDER — SODIUM CHLORIDE 0.9 % IV SOLN
INTRAVENOUS | Status: AC
  Administered 2019-01-01 – 2019-01-02 (×2): via INTRAVENOUS

## 2019-01-01 MED ORDER — MEMANTINE HCL 5 MG PO TABS
10.0000 mg | ORAL_TABLET | Freq: Every day | ORAL | Status: DC
  Administered 2019-01-01 – 2019-01-07 (×7): 10 mg via ORAL
  Filled 2019-01-01 (×7): qty 2

## 2019-01-01 MED ORDER — DILTIAZEM HCL-DEXTROSE 100-5 MG/100ML-% IV SOLN (PREMIX)
5.0000 mg/h | INTRAVENOUS | Status: DC
  Administered 2019-01-01: 5 mg/h via INTRAVENOUS
  Filled 2019-01-01 (×2): qty 100

## 2019-01-01 MED ORDER — APIXABAN 5 MG PO TABS
5.0000 mg | ORAL_TABLET | Freq: Two times a day (BID) | ORAL | Status: DC
  Administered 2019-01-01 – 2019-01-03 (×4): 5 mg via ORAL
  Filled 2019-01-01 (×4): qty 1

## 2019-01-01 MED ORDER — LOPERAMIDE HCL 2 MG PO CAPS
4.0000 mg | ORAL_CAPSULE | Freq: Once | ORAL | Status: AC
  Administered 2019-01-01: 4 mg via ORAL
  Filled 2019-01-01: qty 2

## 2019-01-01 MED ORDER — ONDANSETRON HCL 4 MG/2ML IJ SOLN
4.0000 mg | Freq: Four times a day (QID) | INTRAMUSCULAR | Status: DC | PRN
  Administered 2019-01-02: 4 mg via INTRAVENOUS
  Filled 2019-01-01: qty 2

## 2019-01-01 MED ORDER — MAGNESIUM SULFATE 2 GM/50ML IV SOLN
2.0000 g | Freq: Once | INTRAVENOUS | Status: AC
  Administered 2019-01-01: 2 g via INTRAVENOUS
  Filled 2019-01-01: qty 50

## 2019-01-01 MED ORDER — ACETAMINOPHEN 325 MG PO TABS
650.0000 mg | ORAL_TABLET | Freq: Once | ORAL | Status: AC
  Administered 2019-01-01: 650 mg via ORAL
  Filled 2019-01-01: qty 2

## 2019-01-01 NOTE — ED Notes (Signed)
Admitting at bedside 

## 2019-01-01 NOTE — Discharge Summary (Addendum)
[facility] Discharge Summary  Patient name: Anne Roach Medical record number: [id number] Date of birth: 09/29/1933 Age: 85 y.o. Gender: female Date of Admission: 01/01/2019  Date of Discharge: 01/07/19   Admitting Physician: Blane Ohara McDiarmid, MD  Primary Care Provider: Lucianne Lei, MD Consultants: CONSULT FOR UNASSIGNED MEDICAL ADMISSION CONSULT TO DIETITIAN  Indication for Hospitalization: Campylobacter gastrointestinal tract infection   Discharge Diagnoses/Problem List:  Principal Problem:   Campylobacter gastrointestinal tract infection Active Problems:   OSA (obstructive sleep apnea)   DM (diabetes mellitus) ([place])   HTN (hypertension)   Atrial fibrillation with RVR (HCC)   Chronic kidney disease (CKD), stage III (moderate) (HCC)   AKI (acute kidney injury) ([place])   Hypovolemia with active loss of fluid   Hypokalemia   Hypomagnesemia   Hypoalbuminemia   Bandemia without diagnosis of specific infection   Hypotension   Fatigue   Sepsis ([place])   Hypervolemia  Disposition: Discharge to home with home health PT, RN, HHA  Discharge Condition: Good  Discharge Exam:  BP (!) 120/92 (BP Location: Right Arm)   Pulse 67   Temp 97.7 F (36.5 C) (Oral)   Resp 19   Ht 5\' 6"  (1.676 m)   Wt 78.9 kg   SpO2 97%   BMI 28.08 kg/m   Gen: NAD, alert, non-toxic, well-appearing, sitting comfortably  Skin: Warm and dry. No obvious rashes, lesions, or trauma. HEENT: NCAT No conjunctival pallor or injection. No scleral icterus or injection.  MMM.  CV: RRR.  Normal S1-S2. No murmurs, gallops or rubs appreciated.  RP & DPs 2+ bilaterally. No BLEE. Resp: CTAB.  No wheezing, rales, or rhonchi.  No increased WOB Abd: NTND on palpation to all 4 quadrants.  Positive bowel sounds. Psych: Cooperative with exam. Pleasant. Makes eye contact. Speech normal. Extremities: Moves all extremities spontaneously  Neuro: CN II-XII grossly intact. No FNDs.   Brief  Hospital Course:  Anne Roach is a 85 y.o. female with past medical history significant for CKD III, A fib, HTN, DMII, who presented with fever, chills, diarrhea and admitted for dehydration and afib with RVR. Initial work up with significant for Lactic acid 2.5 which decreased to 1.9 after 1L bolus NS. WBC, plt, and ANC wnl. Patient with asymptomatic bacteruria. CXR without acute finding. Patient febrile on admission. Patient started on diltiazem drip for a fib and intially admitted to progressive unit in setting of drip. GI PCR was positive for Campylobacter. It was treated with 3 day course of azithromycin. Pt also had UTI with E coli > 100k on culture. It was treated with 7 day course of keflex to which it was sensitive. Patient has increased O2 requirement throughout stay, using up to 4L [place] to maintain O2 saturations > 92%. Patient was found to have significant pulmonary edema likeey due to the aggressive fluid rehydration secondary to her profuse diarrhea. Patient was given Lasix with good response. CXR showed decreased pulmonary edema and patient was able to weaned to RA. She was ambulated and maintained saturations > 95%. Due to patient's frailty, she was evaluated by PT. PT recommended HHPT and intermittent supervision. Patient was discharged home with these additional services. Additionally on presentation, patient developed atrial fibrillation with RVR requiring a diltiazem gtt for rate control. This improved as her infection improved. Cardiology was also consulted for further evaluation. They started the patient on oral diltiazem to maintain HR <100 at rest.  Issues for Follow Up:  1. Symptom improvement of diarrhea  2. Did not restart HCTZ, amlodipine as these were paused prior to admission by patient's PCP 3.   Significant Procedures: none  Procedure Orders     EKG 12-Lead     EKG 12-lead     EKG 12-Lead     EKG     ECHOCARDIOGRAM COMPLETE  Significant Labs and Imaging:  Recent  Labs  Lab 01/05/19 0339 01/06/19 0342 01/07/19 0521  WBC 6.8 7.0 9.3  HGB 10.2* 10.3* 10.7*  HCT 31.7* 32.9* 33.6*  PLT 172 214 254   Recent Labs  Lab 01/01/19 1000  01/03/19 0644 01/04/19 0407 01/05/19 0339 01/06/19 0342 01/07/19 0521  NA 135   < > 141 140 140 139 141  K 3.5   < > 4.4 5.0 3.7 3.5 3.6  CL 102   < > 115* 115* 115* 109 110  CO2 23   < > 18* 19* 19* 20* 23  GLUCOSE 133*   < > 141* 127* 105* 100* 110*  BUN 13   < > 15 12 11 10 9   CREATININE 1.48*   < > 1.52* 1.32* 1.33* 1.33* 1.22*  CALCIUM 8.4*   < > 7.6* 7.9* 7.9* 7.9* 8.2*  MG 1.4*  --   --  1.7  --   --   --   ALKPHOS 28*  --   --   --  22*  --   --   AST 22  --   --   --  16  --   --   ALT 14  --   --   --  QUANTITY NOT SUFFICIENT, UNABLE TO PERFORM TEST  --   --   ALBUMIN 3.0*  --   --   --  2.2*  --   --    < > = values in this interval not displayed.    Dg Chest 2 View  Result Date: 01/06/2019 CLINICAL DATA:  Shortness of breath. EXAM: CHEST - 2 VIEW COMPARISON:  01/05/2019 FINDINGS: Stably enlarged cardiac silhouette. Calcific atherosclerotic disease of the aorta. There is no evidence of pneumothorax. Bilateral pleural effusions and lower lobe atelectasis versus airspace consolidation, not significantly changed. Osseous structures are without acute abnormality. Soft tissues are grossly normal. IMPRESSION: Bilateral pleural effusions and lower lobe atelectasis versus airspace consolidation, not significantly changed. Stable cardiomegaly. Electronically Signed   By: Fidela Salisbury M.D.   On: 01/06/2019 08:52   Dg Chest Port 1 View  Result Date: 01/05/2019 CLINICAL DATA:  Dyspnea and respiratory abnormalities EXAM: PORTABLE CHEST 1 VIEW COMPARISON:  Yesterday FINDINGS: Chronic cardiomegaly. Haziness of the lower chest attributed to atelectasis and pleural fluid. No Kerley lines or pneumothorax. IMPRESSION: Stable cardiomegaly, pleural effusions, and presumed atelectasis. Electronically Signed   By:  Monte Fantasia M.D.   On: 01/05/2019 10:58   Dg Chest Port 1 View  Result Date: 01/04/2019 CLINICAL DATA:  85 year old female with a history of desaturation EXAM: PORTABLE CHEST 1 VIEW COMPARISON:  01/01/2019 FINDINGS: Cardiomediastinal silhouette unchanged with persisting cardiomegaly. Fullness in the central vasculature with interlobular septal thickening. New opacity at the right lung base partially obscuring the right heart border and the right hemidiaphragm. Obscuration of the left hemidiaphragm with blunting of the bilateral costophrenic angles. No pneumothorax. IMPRESSION: Evidence of acute congestive heart failure with bilateral pleural effusions and associated atelectasis/consolidation. Electronically Signed   By: Corrie Mckusick D.O.   On: 01/04/2019 12:10   Dg Chest Port 1 View  Result Date: 01/01/2019 CLINICAL DATA:  Sepsis.  History of hypertension and diabetes. EXAM: PORTABLE CHEST 1 VIEW COMPARISON:  12/30/2018 FINDINGS: Cardiac silhouette is mildly enlarged. No mediastinal or hilar masses. There is no evidence of adenopathy. Clear lungs.  No pleural effusion or pneumothorax. Skeletal structures are grossly intact. IMPRESSION: No acute cardiopulmonary disease. Electronically Signed   By: Lajean Manes M.D.   On: 01/01/2019 10:52   Dg Chest Port 1 View  Result Date: 12/30/2018 CLINICAL DATA:  Atrial fibrillation.  Chest pain. EXAM: PORTABLE CHEST 1 VIEW COMPARISON:  03/19/2017 FINDINGS: Artifact overlies the chest. Chronic cardiomegaly and aortic atherosclerosis. Pulmonary vascularity is normal. Lungs are clear. No effusions. No significant bone finding. IMPRESSION: No active disease.  Cardiomegaly and aortic atherosclerosis. Electronically Signed   By: Nelson Chimes M.D.   On: 12/30/2018 11:18   Vas [place] Lower Extremity Venous (dvt) (only Mc & Wl 7a-7p)  Result Date: 12/30/2018  Lower Venous Study Indications: Swelling.  Performing Technologist: June Leap RDMS, RVT  Examination  Guidelines: A complete evaluation includes B-mode imaging, spectral Doppler, color Doppler, and power Doppler as needed of all accessible portions of each vessel. Bilateral testing is considered an integral part of a complete examination. Limited examinations for reoccurring indications may be performed as noted.  Right Venous Findings: +---------+---------------+---------+-----------+----------+-------+          CompressibilityPhasicitySpontaneityPropertiesSummary +---------+---------------+---------+-----------+----------+-------+ CFV      Full           Yes      Yes                          +---------+---------------+---------+-----------+----------+-------+ SFJ      Full                                                 +---------+---------------+---------+-----------+----------+-------+ FV Prox  Full                                                 +---------+---------------+---------+-----------+----------+-------+ FV Mid   Full                                                 +---------+---------------+---------+-----------+----------+-------+ FV DistalFull                                                 +---------+---------------+---------+-----------+----------+-------+ PFV      Full                                                 +---------+---------------+---------+-----------+----------+-------+ POP                     Yes      Yes                          +---------+---------------+---------+-----------+----------+-------+  PTV      Full                                                 +---------+---------------+---------+-----------+----------+-------+ PERO     Full                                                 +---------+---------------+---------+-----------+----------+-------+  Left Venous Findings: +---------+---------------+---------+-----------+----------+-------+          CompressibilityPhasicitySpontaneityPropertiesSummary  +---------+---------------+---------+-----------+----------+-------+ CFV      Full           Yes      Yes                          +---------+---------------+---------+-----------+----------+-------+ SFJ      Full                                                 +---------+---------------+---------+-----------+----------+-------+ FV Prox  Full                                                 +---------+---------------+---------+-----------+----------+-------+ FV Mid   Full                                                 +---------+---------------+---------+-----------+----------+-------+ FV DistalFull                                                 +---------+---------------+---------+-----------+----------+-------+ PFV      Full                                                 +---------+---------------+---------+-----------+----------+-------+ POP                     Yes      Yes                          +---------+---------------+---------+-----------+----------+-------+ PTV      Full                                                 +---------+---------------+---------+-----------+----------+-------+ PERO     Full                                                 +---------+---------------+---------+-----------+----------+-------+  Summary: Right: There is no evidence of deep vein thrombosis in the lower extremity. No cystic structure found in the popliteal fossa. Left: There is no evidence of deep vein thrombosis in the lower extremity. No cystic structure found in the popliteal fossa.  *See table(s) above for measurements and observations. Electronically signed by Monica Martinez MD on 12/30/2018 at 2:57:48 PM.    Final     Results/Tests Pending at Time of Discharge:  Unresulted Labs (From admission, onward)    Start     Ordered   01/06/19 0500  CBC  Daily,   R    Question:  Specimen collection method  Answer:  Lab=Lab collect   01/05/19 0924    01/06/19 5009  Basic metabolic panel  Daily,   R    Question:  Specimen collection method  Answer:  Lab=Lab collect   01/05/19 0924           Discharge Medications:  Allergies as of 01/07/2019   No Known Allergies     Medication List    STOP taking these medications   amLODipine 5 MG tablet Commonly known as:  NORVASC   irbesartan-hydrochlorothiazide 150-12.5 MG tablet Commonly known as:  AVALIDE     TAKE these medications   apixaban 5 MG Tabs tablet Commonly known as:  ELIQUIS Take 1 tablet (5 mg total) by mouth 2 (two) times daily.   cephALEXin 500 MG capsule Commonly known as:  KEFLEX Take 1 capsule (500 mg total) by mouth every 12 (twelve) hours for 3 days.   diltiazem 240 MG 24 hr capsule Commonly known as:  CARDIZEM CD Take 1 capsule (240 mg total) by mouth daily for 30 days.   ICAPS AREDS 2 PO Take 1 tablet by mouth daily.   memantine 10 MG tablet Commonly known as:  NAMENDA Take 10 mg by mouth daily.   metFORMIN 750 MG 24 hr tablet Commonly known as:  GLUCOPHAGE-XR Take 750 mg by mouth daily after supper.   TRIPLE OMEGA COMPLEX PO Take 1 tablet by mouth daily.       Discharge Instructions: Please refer to Patient Instructions section of EMR for full details.  Patient was counseled important signs and symptoms that should prompt return to medical care, changes in medications, dietary instructions, activity restrictions, and follow up appointments.   Follow-Up Appointments: No future appointments.   Bonnita Hollow, MD 01/07/2019, 12:09 PM PGY-2, [place]

## 2019-01-01 NOTE — ED Triage Notes (Signed)
Per EMS patient c/o fever, nausea, fatigue and "feels like she wants to pass out".     Per EMS patient seen on Thursday-diagnosed with afib. Started on eliquis.  Has not taken today.

## 2019-01-01 NOTE — ED Notes (Signed)
Date and time results received: 01/01/19 1121  Test: lactic acid Critical Value: 2.6  Name of Provider Notified: tegeler  Orders Received? Or Actions Taken?: edp notified

## 2019-01-01 NOTE — Progress Notes (Signed)
Pt complains of dizziness after going to bedside commode. VS as follows. T 100.5(orally), BP 105/62 MAP 70 . HR 108-120's. Pt has O2 at 2L/[place] with sats 96-98%. Pt also stating that when she voided it is burning. Pt is alert and oriented. Colletta [place] RN will me made aware and continue to monitor. Jessie Foot, RN

## 2019-01-01 NOTE — H&P (Addendum)
[facility] Admission History and Physical Service Pager: [phone]  Patient name: Anne Roach Medical record number: [id number] Date of birth: 07-Feb-1933 Age: 85 y.o. Gender: female  Primary Care Provider: Lucianne Lei, MD Consultants: None Code Status: Full Code   Chief Complaint: Fever  Assessment and Plan: DARNELLE DERRICK is a 85 y.o. female admitted for dehydration, fever, chills, likely 2/2 acute colitis. Her chronic conditions include Afib just started on eliquis on 12/30/18, OSA,. HTN, DM. Found to have bacteuria in ED.    #Fever, chills and dehydration likely 2/2 acute viral colitis  Patient reports diarrhea starting yesterday with five loose stools throughout the day. Fever and tachycardia meets 2 out of 4 SIRS criteria. Blood culture and urine culture obtained in ED. UA s/f many bacteria but pt without LE or nitrities, dysuria or urinary symptoms. Lactic acid initally mildly elevated, but normalized s/p 1L fluids in ED. WBC and ANC wnl. CXR without acute findings and patient without upper respiratory symptoms. Fever likely due to viral colitis as symptoms as patient has not had any emesis. She does have occasional nausea, however. Will not order stool biorfire at this time. She denies any recent travel, no recent abx use, no recent sick contacts, no new foods. No family members are sick. Pt febrile on admission to 101.8 and then on floor to 102.1 but afebrile at home (temperature in 99's per daughter). No testing at this time. IF > 48 hours, continued fever, severe symptoms, then consider PCR testing to help identify pathogen   Admit to FMTS, attending Dr. McDiarmid. Level of care: Progressive   Diet Heart Healthy  Fall risk   S/p 1 L Fluid, mIVF 170mL   F/u blood culture and lab culture   Loperamide PRN loose stool   Enteric precautions   If acutely worsening, increase mIVF for losses  Daily BMP, replete electrolytes daily   Strict  IO  monitor fever curve  # Paroxsymal Afib w/ RVR likely secondary to acute viral illness, rate controlled HR up 140s in the ED. Requried diltiazem gtt in ED for rate control. CHADVASC2 6. HASBLED 2. Most recent echocardiogram in May 2018 shows ejection fraction of 60 to 65%. Patient was seen in ED 2 days ago for symptoms of CP with negative cardiac workup. Patient was diagnosed with afib two years ago, but was not on mediation during that time as patient was not aware of diagnosis. She was started on eliquis on 12/30/18 5mg  BID x 5 days, then 2.5mg  BID. She has not taken her medication today. Will restart 5 mg through 2/18, then start 2.5mg  BID on 2/19 AM. A fib w/ RVR likely due to stress of illness and dehydration.   Continue diltiazem drip, plan to convert to PO in AM   Daily a.m. EKG  BMP in AM   If requiring multiple boluses, monitor respiratory status closely  Consider echo in setting of no anticoag since afib dx.  #Bacteuria No dysuria.   No tx at this time   Follow up culture    Chronic Issues #CKD III  Creatinine 1.48, up from 1.38 - 2 days ago  # DM Glucose 133 at admit   Follow up A1C   Hold home metformin 750 mg   # HTN Normotensive currently. Held prior to admission per PCP instructions due to mild hypotension over the last two weeks   Hold home amlodipine and  ibesartan hydrochlorothiazide 150/12.5 mg in setting of hypotension.   #  Family Hx Alzheimers Patient without symptoms.   Continue home Namenda 10 mg (started in 2015)  #HIsotry Lung nodule on CT  No  Evidence of follow up chest CT   Out patient follow up w/ new PCP, Dr. Norris Cross   #History of Pressure injury 03/21/17 Stage 1  Wound care  #FEN/GI:   Fluids: 147mL/hr   Electrolytes: Mg, s/p 2g   Zofran for nausea  Nutrition:  Heart Healthy  Access: Left and RT PIV VTE prophylaxis: Eliquis  Disposition: Admit to progressive     History of the Present Illness  Anne Roach is a 85 y.o. female who presents with lightheadedness, near syncope, fatigue, malaise, fevers, chills, weakness and diarrhea. Family reports worsening lightheadheadnes over the last few days but without syncopal event. Per family, heart rate at home was in 140's. No upper respiratory symptoms.  Of note, patient was in the ED on 12/30/18   In the ED, afib with RVR on EKG. Blood culture and urine culture obtained. CBC and ANC wnl. CMP s/f Cr 1.48, GFR 38. Initial LA 2.6, then normalized to 1.9 after 1 L NS. CXR wnl. UA with bacteuria. Patient started on dilt gtt. Also given loperamide and 2g Mg sulfate.   Review Of Systems: Per HPI, with the following additions:  Review of Systems  Constitutional: Positive for chills, fever and malaise/fatigue.  HENT: Negative for congestion, hearing loss, sinus pain and sore throat.   Eyes: Negative for blurred vision and pain.  Respiratory: Positive for cough. Negative for sputum production, shortness of breath, wheezing and stridor.   Cardiovascular: Negative for chest pain and palpitations.  Gastrointestinal: Positive for nausea. Negative for heartburn and vomiting.  Genitourinary: Negative for dysuria, flank pain, frequency and urgency.  Skin: Negative for rash.  Neurological: Positive for dizziness, tingling, tremors, weakness and headaches. Negative for focal weakness, seizures and loss of consciousness.   Patient Active Problem List   Diagnosis Date Noted  . Bacteria in urine 01/01/2019  . Colitis 03/20/2017  . Atrial fibrillation with RVR ([place]) 03/20/2017  . HTN (hypertension)   . Tremor   . OSA (obstructive sleep apnea) 06/29/2012  . DM (diabetes mellitus) ([place])   . Essential hypertension 03/10/2009  . Incidental lung nodule 03/10/2009   Past Medical History: Past Medical History:  Diagnosis Date  . DM (diabetes mellitus) ([place])   . HTN (hypertension)   . Tremor    Past Surgical History: Past Surgical History:  Procedure Laterality  Date  . APPENDECTOMY    . CARPAL TUNNEL RELEASE    . CATARACT EXTRACTION Bilateral   . HERNIA REPAIR     Family History: family history includes Asthma in her brother; Diabetes in her father.   Social History: Social History   Social History Narrative   Speaks English and Arabic. Patient lives at home with her daughter [name]). Patient is widowed.   Patient is retired.   Education high school.   Right handed.   Two children.   Caffeine None     Allergies and Medications: No Known Allergies No current facility-administered medications on file prior to encounter.    Current Outpatient Medications on File Prior to Encounter  Medication Sig Dispense Refill  . amLODipine (NORVASC) 5 MG tablet Take 5 mg by mouth daily.   2  . apixaban (ELIQUIS) 5 MG TABS tablet Take 1 tablet (5 mg total) by mouth 2 (two) times daily. 60 tablet 0  . irbesartan-hydrochlorothiazide (AVALIDE) 150-12.5 MG tablet Take 1  tablet by mouth daily.   2  . memantine (NAMENDA) 10 MG tablet Take 10 mg by mouth daily.   2  . metFORMIN (GLUCOPHAGE-XR) 750 MG 24 hr tablet Take 750 mg by mouth daily after supper.  2  . Multiple Vitamins-Minerals (ICAPS AREDS 2 PO) Take 1 tablet by mouth daily.    [name] 3-6-9 Fatty Acids (TRIPLE OMEGA COMPLEX PO) Take 1 tablet by mouth daily.      Objective: BP 112/68 (BP Location: Right Arm)   Pulse (!) 132   Temp 99.3 F (37.4 C) (Oral)   Resp 20   Ht 5\' 6"  (1.676 m)   Wt 169 lb 14.4 oz (77.1 kg)   SpO2 92%   BMI 27.42 kg/m  Filed Weights   01/01/19 0934 01/01/19 1840  Weight: 170 lb (77.1 kg) 169 lb 14.4 oz (77.1 kg)   Exam: Physical Exam Constitutional:      Appearance: She is obese. She is ill-appearing and toxic-appearing. She is not diaphoretic.  HENT:     Head: Normocephalic and atraumatic.     Nose: Nose normal. No congestion or rhinorrhea.     Mouth/Throat:     Mouth: Mucous membranes are dry.     Pharynx: Oropharynx is clear. No oropharyngeal  exudate or posterior oropharyngeal erythema.  Eyes:     Extraocular Movements: Extraocular movements intact.     Conjunctiva/sclera: Conjunctivae normal.     Pupils: Pupils are equal, round, and reactive to light.  Neck:     Musculoskeletal: Normal range of motion.  Cardiovascular:     Rate and Rhythm: Tachycardia present. Rhythm irregular.     Heart sounds: No murmur.  Pulmonary:     Effort: Pulmonary effort is normal.     Breath sounds: Normal breath sounds.  Abdominal:     General: Abdomen is flat. Bowel sounds are normal. There is no distension.     Palpations: Abdomen is soft.     Tenderness: There is no abdominal tenderness. There is no guarding or rebound.  Musculoskeletal:        General: No swelling or tenderness.  Lymphadenopathy:     Cervical: No cervical adenopathy.  Skin:    General: Skin is warm and dry.     Capillary Refill: Capillary refill takes less than 2 seconds.     Coloration: Skin is pale.  Neurological:     General: No focal deficit present.     Mental Status: She is alert and oriented to person, place, and time.     Cranial Nerves: No cranial nerve deficit.     Labs and Imaging: I have personally reviewed following labs and imaging studies  CBC: Recent Labs  Lab 12/30/18 1022 01/01/19 1000  WBC 7.5 7.4  NEUTROABS  --  5.4  HGB 11.9* 11.2*  HCT 37.4 35.6*  MCV 92.1 91.8  PLT 172 263*   Basic Metabolic Panel: Recent Labs  Lab 12/30/18 1022 01/01/19 1000  NA 139 135  K 3.8 3.5  CL 106 102  CO2 22 23  GLUCOSE 135* 133*  BUN 13 13  CREATININE 1.39* 1.48*  CALCIUM 8.6* 8.4*  MG  --  1.4*   GFR: Estimated Creatinine Clearance: 30.2 mL/min (A) (by C-G formula based on SCr of 1.48 mg/dL (H)). Liver Function Tests: Recent Labs  Lab 01/01/19 1000  AST 22  ALT 14  ALKPHOS 28*  BILITOT 0.8  PROT 6.4*  ALBUMIN 3.0*   Recent Labs  Lab 01/01/19 1000  LIPASE 27   Coagulation Profile: Recent Labs  Lab 01/01/19 1000  INR 1.25    Cardiac Enzymes: Recent Labs  Lab 12/30/18 1305  TROPONINI <0.03   Urine analysis:    Component Value Date/Time   COLORURINE YELLOW 01/01/2019 [place] 01/01/2019 1351   LABSPEC 1.003 (L) 01/01/2019 1351   PHURINE 6.0 01/01/2019 1351   GLUCOSEU NEGATIVE 01/01/2019 1351   HGBUR SMALL (A) 01/01/2019 1351   BILIRUBINUR NEGATIVE 01/01/2019 1351   KETONESUR NEGATIVE 01/01/2019 1351   PROTEINUR NEGATIVE 01/01/2019 1351   UROBILINOGEN 0.2 06/12/2011 1201   NITRITE NEGATIVE 01/01/2019 1351   LEUKOCYTESUR NEGATIVE 01/01/2019 1351   Microbiology: Dg Chest Port 1 View No acute findings    EKG Interpretation  Date/Time:  Saturday January 01 2019 09:35:08 EST Ventricular Rate:  137 PR Interval:    QRS Duration: 72 QT Interval:  327 QTC Calculation: 494 R Axis:   -22 Text Interpretation:  Atrial flutter with varied AV block, Borderline left axis deviation Low voltage, precordial leads RSR' in V1 or V2, probably normal variant Repolarization abnormality, prob rate related Borderline prolonged QT interval When compared to prior, similar a fib but now with RVR.  No STEMI Confirmed by Antony Blackbird [phone]) on 01/01/2019 9:39:40 AM       Procedures:  none   Wilber Oliphant, M.D. 01/01/2019, 10:10 PM PGY-1, [place] Intern pager: [phone], text pages welcome   [place]   I have seen and examined this patient.    I have discussed the findings and exam with the intern and agree with the above note, which I have edited appropriately in [place]. I helped develop the management plan that is described in the resident's note, and I agree with the content.   Marny Lowenstein, MD, MS FAMILY MEDICINE RESIDENT - PGY2 01/01/2019 10:11 PM

## 2019-01-01 NOTE — Progress Notes (Signed)
FPTS Interim Progress Note  Dr. Criss Rosales deny spoke with and examined the patient this evening.  Patient was sleeping on arrival but easily awakened.  She appeared comfortable but did say that she was cold.  She denied urinary symptoms except for some increased frequency of urination.  She also denied any pain and said that she has had 2 watery bowel movements since arrival to the hospital.  Cardiac exam notable for irregularly irregular rhythm with normal rate at around 100 bpm and nontender, nondistended abdomen.  Patient was provided with extra blankets and encouraged to let her nurse know if she has any pain or other complaints.  Dartanyon Frankowski C. Shan Levans, MD PGY-2, [facility] 01/01/2019 8:47 PM

## 2019-01-01 NOTE — ED Notes (Signed)
Applied external cath on pt, per RN.

## 2019-01-01 NOTE — ED Notes (Signed)
Walked pt down the hall, pt O2 dropped to 95%, pt did not complain of any SOB.  Pt HR went to 120 while walking back in room.

## 2019-01-01 NOTE — ED Provider Notes (Signed)
[facility] EMERGENCY DEPARTMENT Provider Note   CSN: [id number] Arrival date & time: 01/01/19  5573     History   Chief Complaint Chief Complaint  Patient presents with  . Fever    HPI Anne Roach is a 85 y.o. female.  The history is provided by the patient and medical records. No language interpreter was used.  Illness  Location:  Generalized fatigue, malaise, nausea, diarrhea, mild cough, tachycardia Severity:  Severe Onset quality:  Gradual Duration:  3 days Timing:  Intermittent Progression:  Waxing and waning Chronicity:  New Associated symptoms: cough, diarrhea, fatigue, fever and nausea   Associated symptoms: no abdominal pain, no chest pain (resolved), no congestion, no headaches, no loss of consciousness, no rash, no rhinorrhea, no shortness of breath, no sore throat, no vomiting and no wheezing     Past Medical History:  Diagnosis Date  . DM (diabetes mellitus) ([place])   . HTN (hypertension)   . Tremor     Patient Active Problem List   Diagnosis Date Noted  . Colitis 03/20/2017  . Atrial fibrillation with RVR ([place]) 03/20/2017  . HTN (hypertension)   . Tremor   . OSA (obstructive sleep apnea) 06/29/2012  . DM (diabetes mellitus) ([place])   . Essential hypertension 03/10/2009  . LUNG NODULE 03/10/2009  . SHORTNESS OF BREATH 03/10/2009  . CHEST PAIN 03/10/2009    Past Surgical History:  Procedure Laterality Date  . APPENDECTOMY    . CARPAL TUNNEL RELEASE    . CATARACT EXTRACTION Bilateral   . HERNIA REPAIR       OB History   No obstetric history on file.      Home Medications    Prior to Admission medications   Medication Sig Start Date End Date Taking? Authorizing Provider  allopurinol (ZYLOPRIM) 100 MG tablet Take 1 tablet (100 mg total) by mouth daily. 05/19/18   Edrick Kins, DPM  amLODipine (NORVASC) 5 MG tablet Take 5 mg by mouth daily.  03/18/18   [provider]  apixaban (ELIQUIS) 5 MG TABS tablet Take  1 tablet (5 mg total) by mouth 2 (two) times daily. 12/30/18   Hayden Rasmussen, MD  colchicine 0.6 MG tablet Take 1 tablet (0.6 mg total) by mouth daily. Patient not taking: Reported on 12/30/2018 01/14/17   Edrick Kins, DPM  gabapentin (NEURONTIN) 100 MG capsule Take 1 capsule (100 mg total) by mouth 3 (three) times daily. Patient not taking: Reported on 12/30/2018 05/19/18   Edrick Kins, DPM  irbesartan-hydrochlorothiazide (AVALIDE) 150-12.5 MG tablet Take 1 tablet by mouth daily.  03/30/18   [provider]  memantine (NAMENDA) 10 MG tablet Take 10 mg by mouth daily.  08/17/14   [provider]  metFORMIN (GLUCOPHAGE-XR) 750 MG 24 hr tablet Take 750 mg by mouth daily after supper. 05/14/15   [provider]  Multiple Vitamins-Minerals (ICAPS AREDS 2 PO) Take 1 tablet by mouth daily.    [provider]  Omega 3-6-9 Fatty Acids (TRIPLE OMEGA COMPLEX PO) Take 1 tablet by mouth daily.    [provider]    Family History Family History  Problem Relation Age of Onset  . Diabetes Father   . Asthma Brother     Social History Social History   Tobacco Use  . Smoking status: Never Smoker  . Smokeless tobacco: Never Used  Substance Use Topics  . Alcohol use: No    Alcohol/week: 0.0 standard drinks  . Drug  use: No     Allergies   Patient has no known allergies.   Review of Systems Review of Systems  Constitutional: Positive for chills, fatigue and fever. Negative for diaphoresis.  HENT: Negative for congestion, rhinorrhea and sore throat.   Eyes: Negative for visual disturbance.  Respiratory: Positive for cough. Negative for chest tightness, shortness of breath, wheezing and stridor.   Cardiovascular: Negative for chest pain (resolved), palpitations and leg swelling.  Gastrointestinal: Positive for diarrhea and nausea. Negative for abdominal pain, constipation and vomiting.  Genitourinary: Negative for dysuria and flank pain.    Musculoskeletal: Negative for back pain, neck pain and neck stiffness.  Skin: Negative for rash and wound.  Neurological: Positive for light-headedness. Negative for dizziness, loss of consciousness, syncope, numbness and headaches.  Psychiatric/Behavioral: Negative for agitation.  All other systems reviewed and are negative.    Physical Exam Updated Vital Signs BP 122/73   Pulse (!) 146   Temp (!) 101.8 F (38.8 C) (Oral)   Resp 20   Wt 77.1 kg   SpO2 94%   BMI 27.44 kg/m   Physical Exam Vitals signs and nursing note reviewed.  Constitutional:      General: She is not in acute distress.    Appearance: She is well-developed. She is not ill-appearing, toxic-appearing or diaphoretic.  HENT:     Head: Normocephalic and atraumatic.     Nose: No congestion or rhinorrhea.     Mouth/Throat:     Mouth: Mucous membranes are moist.     Pharynx: No oropharyngeal exudate or posterior oropharyngeal erythema.  Eyes:     Conjunctiva/sclera: Conjunctivae normal.     Pupils: Pupils are equal, round, and reactive to light.  Neck:     Musculoskeletal: Neck supple.  Cardiovascular:     Rate and Rhythm: Tachycardia present. Rhythm irregular.     Pulses: Normal pulses.     Heart sounds: No murmur.  Pulmonary:     Effort: Pulmonary effort is normal. No respiratory distress.     Breath sounds: Rhonchi present. No wheezing or rales.  Chest:     Chest wall: No tenderness.  Abdominal:     General: Abdomen is flat.     Palpations: Abdomen is soft.     Tenderness: There is no abdominal tenderness.  Musculoskeletal:        General: No tenderness.     Right lower leg: No edema.     Left lower leg: No edema.  Skin:    General: Skin is warm and dry.     Capillary Refill: Capillary refill takes less than 2 seconds.     Findings: No erythema or rash.  Neurological:     General: No focal deficit present.     Mental Status: She is alert.      ED Treatments / Results  Labs (all labs  ordered are listed, but only abnormal results are displayed) Labs Reviewed  COMPREHENSIVE METABOLIC PANEL - Abnormal; Notable for the following components:      Result Value   Glucose, Bld 133 (*)    Creatinine, Ser 1.48 (*)    Calcium 8.4 (*)    Total Protein 6.4 (*)    Albumin 3.0 (*)    Alkaline Phosphatase 28 (*)    GFR calc non Af Amer 32 (*)    GFR calc Af Amer 38 (*)    All other components within normal limits  LACTIC ACID, PLASMA - Abnormal; Notable for the following components:  Lactic Acid, Venous 2.6 (*)    All other components within normal limits  CBC WITH DIFFERENTIAL/PLATELET - Abnormal; Notable for the following components:   Hemoglobin 11.2 (*)    HCT 35.6 (*)    Platelets 140 (*)    All other components within normal limits  PROTIME-INR - Abnormal; Notable for the following components:   Prothrombin Time 15.5 (*)    All other components within normal limits  URINALYSIS, ROUTINE W REFLEX MICROSCOPIC - Abnormal; Notable for the following components:   Specific Gravity, Urine 1.003 (*)    Hgb urine dipstick SMALL (*)    Bacteria, UA MANY (*)    All other components within normal limits  MAGNESIUM - Abnormal; Notable for the following components:   Magnesium 1.4 (*)    All other components within normal limits  CULTURE, BLOOD (ROUTINE X 2)  CULTURE, BLOOD (ROUTINE X 2)  URINE CULTURE  LACTIC ACID, PLASMA  INFLUENZA PANEL BY PCR (TYPE A & B)  LIPASE, BLOOD  I-STAT TROPONIN, ED  I-STAT TROPONIN, ED    EKG EKG Interpretation  Date/Time:  Saturday January 01 2019 09:35:08 EST Ventricular Rate:  137 PR Interval:    QRS Duration: 72 QT Interval:  327 QTC Calculation: 494 R Axis:   -22 Text Interpretation:  Atrial flutter with varied AV block, Borderline left axis deviation Low voltage, precordial leads RSR' in V1 or V2, probably normal variant Repolarization abnormality, prob rate related Borderline prolonged QT interval When compared to prior, similar a  fib but now with RVR.  No STEMI Confirmed by Antony Blackbird [phone]) on 01/01/2019 9:39:40 AM   Radiology Dg Chest Port 1 View  Result Date: 01/01/2019 CLINICAL DATA:  Sepsis.  History of hypertension and diabetes. EXAM: PORTABLE CHEST 1 VIEW COMPARISON:  12/30/2018 FINDINGS: Cardiac silhouette is mildly enlarged. No mediastinal or hilar masses. There is no evidence of adenopathy. Clear lungs.  No pleural effusion or pneumothorax. Skeletal structures are grossly intact. IMPRESSION: No acute cardiopulmonary disease. Electronically Signed   By: Lajean Manes M.D.   On: 01/01/2019 10:52    Procedures Procedures (including critical care time)  Medications Ordered in ED Medications  sodium chloride flush (NS) 0.9 % injection 3 mL (3 mLs Intravenous Not Given 01/01/19 1628)  diltiazem (CARDIZEM) 100 mg in dextrose 5% 116mL (1 mg/mL) infusion (5 mg/hr Intravenous New Bag/Given 01/01/19 1626)  loperamide (IMODIUM) capsule 4 mg (has no administration in time range)    Followed by  loperamide (IMODIUM) capsule 2 mg (has no administration in time range)  acetaminophen (TYLENOL) tablet 650 mg (650 mg Oral Given 01/01/19 0954)  sodium chloride 0.9 % bolus 1,000 mL (0 mLs Intravenous Stopped 01/01/19 1139)     Initial Impression / Assessment and Plan / ED Course  I have reviewed the triage vital signs and the nursing notes.  Pertinent labs & imaging results that were available during my care of the patient were reviewed by me and considered in my medical decision making (see chart for details).     Anne Roach is a 85 y.o. female with a past medical history significant for hypertension, diabetes, atrial fibrillation, sleep apnea, and chronic tremor who presents with lightheadedness, near syncope, fatigue, malaise, fevers, chills, nausea, diarrhea, and mild dry cough.  Patient is coming by family.  They report that patient was seen several days ago and was told that she has A. fib with RVR and was  started back on Eliquis.  They were unaware that  she had a previous diagnosis of this.  She reports that for the last few days she has been having worsening fatigue and lightheadedness.  She reports she feels like she is going to pass out.  She reports chest pain several days ago but is not chest pain today.  She does report that she has had fevers and chills and nausea.  No vomiting.  She reports that some diarrhea and some abdominal cramping.  She reports abdominal pain has resolved.  She denies any leg pain or leg swelling.  She reports is not eating or drinking as much.  She says she feels dehydrated.  Heart rate at home was in the 140s last night and today.  She reports no significant congestion or rhinorrhea.  On exam, patient is tachycardic in the 140s.  EKG shows A. fib with RVR.  Chest is nontender.  Lungs had some mild rhonchi.  No significant crackles.  No wheezing.  No murmur.  Abdomen nontender.  Normal sensation and strength in extremities.  Patient reports feeling very lightheaded.  Clinically I am concerned about dehydration versus a an infection causing her A. fib with RVR.  Patient will be given fluids.  With the prevalence of influenza recently, patient will have flu test with a dry cough, nausea, and diarrhea with fever.  Patient will also have chest x-ray and labs and urinalysis.  If patient does not have improvement in her symptoms with her A. fib with RVR, patient may require admission for rate control and rehydration and further management.  3:30 PM Patient's labs began to return.  Patient had negative flu test.  Lactic acid initially elevated but improved with fluids.  Troponin negative.  Urinalysis shows no infection.  Chest x-ray shows no pneumonia.  Mild anemia and no leukocytosis.  Magnesium slightly low.  Patient still felt dehydrated after initial fluids.  Patient was ambulated and felt lightheaded and short of breath.  Patient's heart rate jumped into the 120s and 130s.   Still feel patient has a combination of A. fib with RVR due to dehydration and her likely viral gastroenteritis.  Given patient's continued tachycardia and her severe fatigue after returning to the emergency department, do not feel she safe for discharge home.  Hospitalist team called for admission.  She will be started on a diltiazem drip.  Final Clinical Impressions(s) / ED Diagnoses   Final diagnoses:  Atrial fibrillation with RVR ([facility])  Fatigue, unspecified type  Lightheaded  Viral illness    ED Discharge Orders    None     Clinical Impression: 1. Atrial fibrillation with RVR ([place])   2. Sepsis ([place])   3. Fatigue, unspecified type   4. Lightheaded   5. Viral illness     Disposition: Admit  This note was prepared with assistance of Dragon voice recognition software. Occasional wrong-word or sound-a-like substitutions may have occurred due to the inherent limitations of voice recognition software.     Tegeler, Gwenyth Allegra, MD 01/01/19 1630

## 2019-01-02 DIAGNOSIS — E869 Volume depletion, unspecified: Secondary | ICD-10-CM

## 2019-01-02 DIAGNOSIS — K529 Noninfective gastroenteritis and colitis, unspecified: Secondary | ICD-10-CM

## 2019-01-02 DIAGNOSIS — E119 Type 2 diabetes mellitus without complications: Secondary | ICD-10-CM

## 2019-01-02 DIAGNOSIS — N183 Chronic kidney disease, stage 3 unspecified: Secondary | ICD-10-CM | POA: Diagnosis present

## 2019-01-02 DIAGNOSIS — E860 Dehydration: Secondary | ICD-10-CM

## 2019-01-02 DIAGNOSIS — I9589 Other hypotension: Secondary | ICD-10-CM

## 2019-01-02 DIAGNOSIS — E861 Hypovolemia: Secondary | ICD-10-CM | POA: Diagnosis present

## 2019-01-02 DIAGNOSIS — A419 Sepsis, unspecified organism: Secondary | ICD-10-CM

## 2019-01-02 DIAGNOSIS — I959 Hypotension, unspecified: Secondary | ICD-10-CM | POA: Diagnosis present

## 2019-01-02 DIAGNOSIS — E876 Hypokalemia: Secondary | ICD-10-CM | POA: Diagnosis present

## 2019-01-02 DIAGNOSIS — I4891 Unspecified atrial fibrillation: Secondary | ICD-10-CM

## 2019-01-02 DIAGNOSIS — N1832 Chronic kidney disease, stage 3b: Secondary | ICD-10-CM | POA: Diagnosis present

## 2019-01-02 DIAGNOSIS — G4733 Obstructive sleep apnea (adult) (pediatric): Secondary | ICD-10-CM

## 2019-01-02 DIAGNOSIS — R5383 Other fatigue: Secondary | ICD-10-CM

## 2019-01-02 DIAGNOSIS — N179 Acute kidney failure, unspecified: Secondary | ICD-10-CM | POA: Diagnosis present

## 2019-01-02 DIAGNOSIS — R6520 Severe sepsis without septic shock: Secondary | ICD-10-CM

## 2019-01-02 DIAGNOSIS — E8809 Other disorders of plasma-protein metabolism, not elsewhere classified: Secondary | ICD-10-CM

## 2019-01-02 DIAGNOSIS — D72825 Bandemia: Secondary | ICD-10-CM | POA: Diagnosis present

## 2019-01-02 LAB — CBC WITH DIFFERENTIAL/PLATELET
Abs Immature Granulocytes: 0.03 10*3/uL (ref 0.00–0.07)
Abs Immature Granulocytes: 0.03 10*3/uL (ref 0.00–0.07)
BASOS ABS: 0 10*3/uL (ref 0.0–0.1)
Basophils Absolute: 0 10*3/uL (ref 0.0–0.1)
Basophils Relative: 0 %
Basophils Relative: 0 %
Eosinophils Absolute: 0 10*3/uL (ref 0.0–0.5)
Eosinophils Absolute: 0 10*3/uL (ref 0.0–0.5)
Eosinophils Relative: 0 %
Eosinophils Relative: 0 %
HCT: 33.2 % — ABNORMAL LOW (ref 36.0–46.0)
HCT: 33.8 % — ABNORMAL LOW (ref 36.0–46.0)
Hemoglobin: 10.7 g/dL — ABNORMAL LOW (ref 12.0–15.0)
Hemoglobin: 10.5 g/dL — ABNORMAL LOW (ref 12.0–15.0)
Immature Granulocytes: 1 %
Immature Granulocytes: 1 %
LYMPHS PCT: 28 %
Lymphocytes Relative: 19 %
Lymphs Abs: 1 10*3/uL (ref 0.7–4.0)
Lymphs Abs: 1.3 10*3/uL (ref 0.7–4.0)
MCH: 29.2 pg (ref 26.0–34.0)
MCH: 28.8 pg (ref 26.0–34.0)
MCHC: 32.2 g/dL (ref 30.0–36.0)
MCHC: 31.1 g/dL (ref 30.0–36.0)
MCV: 90.7 fL (ref 80.0–100.0)
MCV: 92.9 fL (ref 80.0–100.0)
Monocytes Absolute: 0.8 10*3/uL (ref 0.1–1.0)
Monocytes Absolute: 0.6 10*3/uL (ref 0.1–1.0)
Monocytes Relative: 16 %
Monocytes Relative: 13 %
NRBC: 0 % (ref 0.0–0.2)
Neutro Abs: 3.3 10*3/uL (ref 1.7–7.7)
Neutro Abs: 2.8 10*3/uL (ref 1.7–7.7)
Neutrophils Relative %: 64 %
Neutrophils Relative %: 58 %
PLATELETS: 129 10*3/uL — AB (ref 150–400)
Platelets: 132 10*3/uL — ABNORMAL LOW (ref 150–400)
RBC: 3.66 MIL/uL — AB (ref 3.87–5.11)
RBC: 3.64 MIL/uL — ABNORMAL LOW (ref 3.87–5.11)
RDW: 13.8 % (ref 11.5–15.5)
RDW: 13.7 % (ref 11.5–15.5)
WBC Morphology: INCREASED
WBC Morphology: INCREASED
WBC: 5.2 10*3/uL (ref 4.0–10.5)
WBC: 4.8 10*3/uL (ref 4.0–10.5)
nRBC: 0 % (ref 0.0–0.2)

## 2019-01-02 LAB — GASTROINTESTINAL PANEL BY PCR, STOOL (REPLACES STOOL CULTURE)

## 2019-01-02 LAB — BASIC METABOLIC PANEL
Anion gap: 6 (ref 5–15)
BUN: 15 mg/dL (ref 8–23)
CO2: 22 mmol/L (ref 22–32)
Calcium: 7.7 mg/dL — ABNORMAL LOW (ref 8.9–10.3)
Chloride: 109 mmol/L (ref 98–111)
Creatinine, Ser: 1.5 mg/dL — ABNORMAL HIGH (ref 0.44–1.00)
GFR calc Af Amer: 37 mL/min — ABNORMAL LOW (ref >60)
GFR, EST NON AFRICAN AMERICAN: 32 mL/min — AB (ref >60)
Glucose, Bld: 180 mg/dL — ABNORMAL HIGH (ref 70–99)
Potassium: 3 mmol/L — ABNORMAL LOW (ref 3.5–5.1)
Sodium: 137 mmol/L (ref 135–145)

## 2019-01-02 LAB — PROTIME-INR
INR: 1.62
Prothrombin Time: 19 seconds — ABNORMAL HIGH (ref 11.4–15.2)

## 2019-01-02 LAB — TSH: TSH: 3.733 u[IU]/mL (ref 0.350–4.500)

## 2019-01-02 LAB — OCCULT BLOOD X 1 CARD TO LAB, STOOL: Fecal Occult Bld: POSITIVE — AB

## 2019-01-02 LAB — TROPONIN I: Troponin I: <0.03 ng/mL (ref <0.03)

## 2019-01-02 LAB — PREALBUMIN: Prealbumin: 12.8 mg/dL — ABNORMAL LOW (ref 18–38)

## 2019-01-02 MED ORDER — AZITHROMYCIN 250 MG PO TABS
500.0000 mg | ORAL_TABLET | ORAL | Status: AC
  Administered 2019-01-03 – 2019-01-05 (×3): 500 mg via ORAL
  Filled 2019-01-02 (×3): qty 2

## 2019-01-02 MED ORDER — DEXTROSE-NACL 5-0.9 % IV SOLN: INTRAVENOUS | Status: DC

## 2019-01-02 MED ORDER — SODIUM CHLORIDE 0.9 % IV SOLN
1.0000 g | INTRAVENOUS | Status: DC
  Administered 2019-01-02: 1 g via INTRAVENOUS
  Filled 2019-01-02: qty 10

## 2019-01-02 MED ORDER — DILTIAZEM HCL 60 MG PO TABS
60.0000 mg | ORAL_TABLET | Freq: Four times a day (QID) | ORAL | Status: DC
  Administered 2019-01-02 – 2019-01-03 (×4): 60 mg via ORAL
  Filled 2019-01-02 (×4): qty 1

## 2019-01-02 MED ORDER — METRONIDAZOLE IN NACL 5-0.79 MG/ML-% IV SOLN
500.0000 mg | Freq: Three times a day (TID) | INTRAVENOUS | Status: DC
  Administered 2019-01-02 (×2): 500 mg via INTRAVENOUS
  Filled 2019-01-02 (×3): qty 100

## 2019-01-02 MED ORDER — POTASSIUM CHLORIDE CRYS ER 20 MEQ PO TBCR
40.0000 meq | EXTENDED_RELEASE_TABLET | Freq: Two times a day (BID) | ORAL | Status: AC
  Administered 2019-01-02 (×2): 40 meq via ORAL
  Filled 2019-01-02 (×2): qty 2

## 2019-01-02 MED ORDER — SODIUM CHLORIDE 0.9 % IV SOLN
INTRAVENOUS | Status: DC
  Administered 2019-01-02: 13:00:00 via INTRAVENOUS

## 2019-01-02 MED ORDER — CIPROFLOXACIN IN D5W 400 MG/200ML IV SOLN
400.0000 mg | Freq: Two times a day (BID) | INTRAVENOUS | Status: DC
  Administered 2019-01-02: 400 mg via INTRAVENOUS
  Filled 2019-01-02 (×2): qty 200

## 2019-01-02 MED ORDER — CEPHALEXIN 500 MG PO CAPS
500.0000 mg | ORAL_CAPSULE | Freq: Two times a day (BID) | ORAL | Status: DC
  Administered 2019-01-03 – 2019-01-07 (×9): 500 mg via ORAL
  Filled 2019-01-02 (×9): qty 1

## 2019-01-02 MED ORDER — KCL IN DEXTROSE-NACL 40-5-0.9 MEQ/L-%-% IV SOLN
INTRAVENOUS | Status: DC
  Administered 2019-01-02 – 2019-01-04 (×5): via INTRAVENOUS
  Filled 2019-01-02 (×6): qty 1000

## 2019-01-02 NOTE — Progress Notes (Signed)
FPTS Interim Progress Note  S:Went to see patient with Dr. Maudie Mercury overnight. Patient resting comfortably, NAD. Patient states she no longer has chills. Continues to have diarrhea but no vomiting. Appears somewhat improved from admission. VSS, somewhat soft BP but appears to have been low all day. Afebrile.   O: BP (!) 99/50   Pulse 68   Temp 99.2 F (37.3 C) (Axillary)   Resp 18   Ht 5\' 6"  (1.676 m)   Wt 79.5 kg   SpO2 98%   BMI 28.29 kg/m     A/P: Will continue to monitor closely.  Continue cipro D5 NS @125  cc/h  Caroline More, DO 01/02/2019, 8:56 PM PGY-2, [place] [facility] pager [phone]

## 2019-01-02 NOTE — Progress Notes (Signed)
Notified by Lab patients stool specimen POSITIVE for Campylobacter. Provider on call notified via [employer]. Will continue to monitor. Jessie Foot, RN

## 2019-01-02 NOTE — Consult Note (Signed)
Cardiology Consultation:   Patient ID: Anne Roach MRN: [id number]; DOB: 20-Jul-1933  Admit date: 01/01/2019 Date of Consult: 01/02/2019  Primary Care Provider: Lucianne Lei, MD Primary Cardiologist: New/Tawana Pasch Primary Electrophysiologist:  None     Patient Profile:   Anne Roach is a 85 y.o. female with a hx of HTN, DM, OSA  who is being seen today for the evaluation of afib at the request of Dr Higinio Plan.  History of Present Illness:   Anne Roach 85 y.o. [place] female admitted with fever chills and dehydration Had diarrhea fever ? Viral colitis Noted to have rapid afib. Daughter gives most of the history She is not followed closely medically. Denies dyspnea Palpitations or chest pain No history of MI or CHF No history of stroke or contraindication to anticoagulation Started on  eliquis for anticoagulation Currently on cardizem drip for rate control She just complains of feeling week Prior to  Admission daughter indicates she was active around the house cooking and doing ADL's and got out with her to shop She is not a fall risk   Past Medical History:  Diagnosis Date  . DM (diabetes mellitus) ([place])   . HTN (hypertension)   . Tremor     Past Surgical History:  Procedure Laterality Date  . APPENDECTOMY    . CARPAL TUNNEL RELEASE    . CATARACT EXTRACTION Bilateral   . HERNIA REPAIR       Home Medications:  Prior to Admission medications   Medication Sig Start Date End Date Taking? Authorizing Provider  amLODipine (NORVASC) 5 MG tablet Take 5 mg by mouth daily.  03/18/18  Yes [provider]  apixaban (ELIQUIS) 5 MG TABS tablet Take 1 tablet (5 mg total) by mouth 2 (two) times daily. 12/30/18  Yes Hayden Rasmussen, MD  irbesartan-hydrochlorothiazide (AVALIDE) 150-12.5 MG tablet Take 1 tablet by mouth daily.  03/30/18  Yes [provider]  memantine (NAMENDA) 10 MG tablet Take 10 mg by mouth daily.  08/17/14  Yes [provider]  metFORMIN  (GLUCOPHAGE-XR) 750 MG 24 hr tablet Take 750 mg by mouth daily after supper. 05/14/15  Yes [provider]  Multiple Vitamins-Minerals (ICAPS AREDS 2 PO) Take 1 tablet by mouth daily.   Yes [provider]  Omega 3-6-9 Fatty Acids (TRIPLE OMEGA COMPLEX PO) Take 1 tablet by mouth daily.   Yes [provider]    Inpatient Medications: Scheduled Meds: . apixaban  5 mg Oral BID  . memantine  10 mg Oral Daily  . potassium chloride  40 mEq Oral BID  . sodium chloride flush  3 mL Intravenous Once   Continuous Infusions: . cefTRIAXone (ROCEPHIN)  IV Stopped (01/02/19 0126)  . diltiazem (CARDIZEM) infusion 10 mg/hr (01/02/19 0604)   PRN Meds: acetaminophen, [COMPLETED] loperamide **FOLLOWED BY** loperamide, ondansetron (ZOFRAN) IV  Allergies:   No Known Allergies  Social History:   Social History   Socioeconomic History  . Marital status: Widowed    Spouse name: Not on file  . Number of children: 3  . Years of education: 87  . Highest education level: Not on file  Occupational History    Comment: Retired  Scientific laboratory technician  . Financial resource strain: Not on file  . Food insecurity:    Worry: Not on file    Inability: Not on file  . Transportation needs:    Medical: Not on file    Non-medical: Not on file  Tobacco Use  . Smoking status: Never  Smoker  . Smokeless tobacco: Never Used  Substance and Sexual Activity  . Alcohol use: No    Alcohol/week: 0.0 standard drinks  . Drug use: No  . Sexual activity: Not on file  Lifestyle  . Physical activity:    Days per week: Not on file    Minutes per session: Not on file  . Stress: Not on file  Relationships  . Social connections:    Talks on phone: Not on file    Gets together: Not on file    Attends religious service: Not on file    Active member of club or organization: Not on file    Attends meetings of clubs or organizations: Not on file    Relationship status: Not on file  . Intimate partner  violence:    Fear of current or ex partner: Not on file    Emotionally abused: Not on file    Physically abused: Not on file    Forced sexual activity: Not on file  Other Topics Concern  . Not on file  Social History Narrative   Speaks English and Arabic. Patient lives at home with her daughter Anne Roach). Patient is widowed.   Patient is retired.   Education high school.   Right handed.   Two children.   Caffeine None    Family History:    Family History  Problem Relation Age of Onset  . Diabetes Father   . Asthma Brother      ROS:  Please see the history of present illness.   All other ROS reviewed and negative.     Physical Exam/Data:   Vitals:   01/02/19 0011 01/02/19 0407 01/02/19 0741 01/02/19 0900  BP: (!) 93/53 101/74 (!) 95/46 (!) 98/53  Pulse: 98 (!) 130 88 99  Resp: 18 (!) 21 18   Temp: 98.9 F (37.2 C) (!) 102.4 F (39.1 C) 98.8 F (37.1 C)   TempSrc: Oral Axillary Oral   SpO2: 96% 95% 96% 98%  Weight:  79.5 kg    Height:        Intake/Output Summary (Last 24 hours) at 01/02/2019 1050 Last data filed at 01/02/2019 0700 Gross per 24 hour  Intake 2453.88 ml  Output 2 ml  Net 2451.88 ml   Last 3 Weights 01/02/2019 01/01/2019 01/01/2019  Weight (lbs) 175 lb 4.8 oz 169 lb 14.4 oz 170 lb  Weight (kg) 79.516 kg 77.066 kg 77.111 kg     Body mass index is 28.29 kg/m.  General:  Well nourished, well developed, in no acute distress  HEENT: normal Lymph: no adenopathy Neck: no JVD Endocrine:  No thryomegaly Vascular: No carotid bruits; FA pulses 2+ bilaterally without bruits  Cardiac:  normal S1, S2; RRR; SEM  Lungs:  clear to auscultation bilaterally, no wheezing, rhonchi or rales  Abd: soft, nontender, no hepatomegaly  Ext: no edema Musculoskeletal:  No deformities, BUE and BLE strength normal and equal Skin: warm and dry  Neuro:  CNs 2-12 intact, no focal abnormalities noted Psych:  Normal affect   EKG:  The EKG was personally reviewed and  demonstrates:  afib nonspecific ST changes she was in afib on ECG dated 03/20/2017 Telemetry:  Telemetry was personally reviewed and demonstrates:  Afib rates 100  Relevant CV Studies: TTE pending   Laboratory Data:  Chemistry Recent Labs  Lab 12/30/18 1022 01/01/19 1000 01/02/19 0527  NA 139 135 137  K 3.8 3.5 3.0*  CL 106 102 109  CO2 22 23 22  GLUCOSE 135* 133* 180*  BUN 13 13 15   CREATININE 1.39* 1.48* 1.50*  CALCIUM 8.6* 8.4* 7.7*  GFRNONAA 35* 32* 32*  GFRAA 41* 38* 37*  ANIONGAP 11 10 6     Recent Labs  Lab 01/01/19 1000  PROT 6.4*  ALBUMIN 3.0*  AST 22  ALT 14  ALKPHOS 28*  BILITOT 0.8   Hematology Recent Labs  Lab 12/30/18 1022 01/01/19 1000 01/02/19 0527  WBC 7.5 7.4 5.2  RBC 4.06 3.88 3.66*  HGB 11.9* 11.2* 10.7*  HCT 37.4 35.6* 33.2*  MCV 92.1 91.8 90.7  MCH 29.3 28.9 29.2  MCHC 31.8 31.5 32.2  RDW 13.4 13.5 13.8  PLT 172 140* 129*   Cardiac Enzymes Recent Labs  Lab 12/30/18 1305  TROPONINI <0.03    Recent Labs  Lab 12/30/18 1034 01/01/19 1022  TROPIPOC 0.00 0.00    BNPNo results for input(s): BNP, PROBNP in the last 168 hours.  DDimer No results for input(s): DDIMER in the last 168 hours.  Radiology/Studies:  Dg Chest Port 1 View  Result Date: 01/01/2019 CLINICAL DATA:  Sepsis.  History of hypertension and diabetes. EXAM: PORTABLE CHEST 1 VIEW COMPARISON:  12/30/2018 FINDINGS: Cardiac silhouette is mildly enlarged. No mediastinal or hilar masses. There is no evidence of adenopathy. Clear lungs.  No pleural effusion or pneumothorax. Skeletal structures are grossly intact. IMPRESSION: No acute cardiopulmonary disease. Electronically Signed   By: Lajean Manes M.D.   On: 01/01/2019 10:52   Dg Chest Port 1 View  Result Date: 12/30/2018 CLINICAL DATA:  Atrial fibrillation.  Chest pain. EXAM: PORTABLE CHEST 1 VIEW COMPARISON:  03/19/2017 FINDINGS: Artifact overlies the chest. Chronic cardiomegaly and aortic atherosclerosis. Pulmonary  vascularity is normal. Lungs are clear. No effusions. No significant bone finding. IMPRESSION: No active disease.  Cardiomegaly and aortic atherosclerosis. Electronically Signed   By: Nelson Chimes M.D.   On: 12/30/2018 11:18   Vas [place] Lower Extremity Venous (dvt) (only Mc & Wl 7a-7p)  Result Date: 12/30/2018  Lower Venous Study Indications: Swelling.  Performing Technologist: June Leap RDMS, RVT  Examination Guidelines: A complete evaluation includes B-mode imaging, spectral Doppler, color Doppler, and power Doppler as needed of all accessible portions of each vessel. Bilateral testing is considered an integral part of a complete examination. Limited examinations for reoccurring indications may be performed as noted.  Right Venous Findings: +---------+---------------+---------+-----------+----------+-------+          CompressibilityPhasicitySpontaneityPropertiesSummary +---------+---------------+---------+-----------+----------+-------+ CFV      Full           Yes      Yes                          +---------+---------------+---------+-----------+----------+-------+ SFJ      Full                                                 +---------+---------------+---------+-----------+----------+-------+ FV Prox  Full                                                 +---------+---------------+---------+-----------+----------+-------+ FV Mid   Full                                                 +---------+---------------+---------+-----------+----------+-------+  FV DistalFull                                                 +---------+---------------+---------+-----------+----------+-------+ PFV      Full                                                 +---------+---------------+---------+-----------+----------+-------+ POP                     Yes      Yes                          +---------+---------------+---------+-----------+----------+-------+ PTV      Full                                                  +---------+---------------+---------+-----------+----------+-------+ PERO     Full                                                 +---------+---------------+---------+-----------+----------+-------+  Left Venous Findings: +---------+---------------+---------+-----------+----------+-------+          CompressibilityPhasicitySpontaneityPropertiesSummary +---------+---------------+---------+-----------+----------+-------+ CFV      Full           Yes      Yes                          +---------+---------------+---------+-----------+----------+-------+ SFJ      Full                                                 +---------+---------------+---------+-----------+----------+-------+ FV Prox  Full                                                 +---------+---------------+---------+-----------+----------+-------+ FV Mid   Full                                                 +---------+---------------+---------+-----------+----------+-------+ FV DistalFull                                                 +---------+---------------+---------+-----------+----------+-------+ PFV      Full                                                 +---------+---------------+---------+-----------+----------+-------+  POP                     Yes      Yes                          +---------+---------------+---------+-----------+----------+-------+ PTV      Full                                                 +---------+---------------+---------+-----------+----------+-------+ PERO     Full                                                 +---------+---------------+---------+-----------+----------+-------+    Summary: Right: There is no evidence of deep vein thrombosis in the lower extremity. No cystic structure found in the popliteal fossa. Left: There is no evidence of deep vein thrombosis in the lower extremity. No cystic  structure found in the popliteal fossa.  *See table(s) above for measurements and observations. Electronically signed by Monica Martinez MD on 12/30/2018 at 2:57:48 PM.    Final     Assessment and Plan:   1. AFib:  Not clear if this is chronic or paroxysmal related to infection ECG May 2018 with afib D/c norvasc Change cardizem to PO and add oral beta blocker if needed. Her TTE is normal and there is no urgency to cardiovert and  She may end up being a rate control /anticoagulation candidate Will arrange outpatient f/u in afib clinic / me in 3 weeks 2. ID:  ? Colitis CXR, Urine benign WBC ok on Rocephin       For questions or updates, please contact [place] Please consult [email or web address] for contact info under     Signed, Jenkins Rouge, MD  01/02/2019 10:50 AM

## 2019-01-02 NOTE — Progress Notes (Signed)
Family Medicine Teaching Service Daily Progress Note Intern Pager: [phone]  Patient name: Anne Roach Medical record number: [id number] Date of birth: November 16, 1933 Age: 85 y.o. Gender: female  Primary Care Provider: Lucianne Lei, MD Consultants: Cards Code Status: Full   Pt Overview and Major Events to Date:  Admitted 2/15  Assessment and Plan:  Anne Roach is a 85 y.o. female admitted for dehydration, fever, chills, likely 2/2 acute colitis. Her chronic conditions include Afib just started on eliquis on 12/30/18, OSA,. HTN, DM. Found to have bacteuria in ED.    #Fever, chills and dehydration likely 2/2 acute colitis: Acute, worsening. Daughter and patient feel her frequent diarrhea is worsening, abdominal pain throughout/nonsurgical. Continues to meet sepsis criteria: Intermittently febrile, last temperature 102.4 at 0400, tachycardic, and occasionally tachypneic.  No leukocytosis, however > 20% bands. Bcx negative at 24 hours.  Watery bowel movements and dehydration likely secondary to a viral colitis, however given patient's age, presentation, and subjective worsening- will obtain stool panel.  Had a similar presentation of colitis back in May 2018, lost to follow-up with GI. - Entire precautions - Continue to monitor CBC, BMP, blood culture - Obtain stool panel, lactoferrin, FOBT - Start IV ciprofloxacin 400 mg every 12 and metronidazole 500 mg every 8, likely total of 5 days - Continue IV fluid resuscitation, 125 mL - Encourage oral rehydration, may eat if tolerated - Strict I's and O's - Monitor vitals - Consider outpatient follow-up with GI given recurrent colitis  # Paroxsymal Afib w/ RVR likely secondary to acute viral illness: acute on chronic.  Continues to endorse palpitations.  Intermittently tachycardic, Max 130's overnight, with decreased blood pressures (MAP remaining around 66), on diltiazem drip.  Started on anticoagulation with Eliquis on 2/13.  Will transition  to 2.5 mg twice daily on 2/19.  CHADVASC2 score 6, HASBLED 2.  -Consult Cards for additional assistance on dilt drip -Obtain Echocardiogram - Daily EKG - Monitor BMP, electrolytes  #UTI:  Patient reporting dysuria and urinary frequency, many bacteria noted on U/A. - Started on ceftriaxone 1 g daily (2/16), will discontinue with initiation of additional antibiotics as above - Continue to monitor symptoms  #CKD III: Acute on chronic with elevated creatinine. Creatinine 1.5 today, from 1.38 on admission.  Likely in the setting of dehydration, continued fluid losses. -Continue to monitor BMP -Avoid nephrotoxic medications -Encourage oral rehydration - Continue IV fluids  Chronic issues: # DM:  Glucose 180 this a.m.  No A1c documented.  Only takes metformin 750 mg at home. - Follow-up A1c - Holding home metformin  # HTN:  Currently hypotensive to normotensive during stay.  Map remaining above 65, monitoring closely with diltiazem drip on as for above. - Continue to hold home amlodipine and irbesartan-HCTZ   # Family Hx Alzheimers Patient without symptoms.  -Continue home Namenda 10 mg (started in 2015)  #HIsotry Lung nodule on CT  No  Evidence of follow up chest CT  -Out patient follow up w/ new PCP, Dr. Norris Cross   #History of Pressure injury 03/21/17 Stage 1 -Wound care  #FEN/GI:   Fluids: 115mL/hr   Electrolytes: Mg, s/p 2g   Zofran for nausea  Nutrition:  Heart Healthy  Access: Left and RT PIV VTE prophylaxis: Eliquis  Disposition: Continued inpatient care  Subjective:  No acute events overnight.  Feeling tired this morning, barely slept due to continued watery bowel movements overnight.  Daughter at bedside feels like this is worsening compared to onset.  Patient also  endorses dysuria and generalized abdominal pain.  She has not eaten much at all, is trying to drink some water and cranberry juice.  Denies any melena, hematochezia, mucus.  States her  bowel movements sometimes are green.  Endorses continued palpitations, no chest pains, or difficulty breathing.  Objective: Temp:  [98.7 F (37.1 C)-102.4 F (39.1 C)] 98.8 F (37.1 C) (02/16 0741) Pulse Rate:  [78-146] 88 (02/16 0741) Resp:  [15-24] 18 (02/16 0741) BP: (93-143)/(46-80) 95/46 (02/16 0741) SpO2:  [91 %-97 %] 96 % (02/16 0741) Weight:  [77.1 kg-79.5 kg] 79.5 kg (02/16 0407) Physical Exam: General: Alert, appears tired and pale however in no acute distress HEENT: NCAT, MM dry  Cardiac: Irregular rate and rhythm, no murmurs noted Lungs: Clear bilaterally, no increased WOB  Abdomen: Soft, nondistended, normoactive bowel sounds, tender to palpation throughout.  No rebounding or guarding.  No hepatosplenomegaly palpated.  Msk: Moves all extremities spontaneously  Ext: Warm, 2+ distal pulses, no edema  Derm: Skin is clammy throughout.   Laboratory: Recent Labs  Lab 12/30/18 1022 01/01/19 1000 01/02/19 0527  WBC 7.5 7.4 5.2  HGB 11.9* 11.2* 10.7*  HCT 37.4 35.6* 33.2*  PLT 172 140* 129*   Recent Labs  Lab 12/30/18 1022 01/01/19 1000 01/02/19 0527  NA 139 135 137  K 3.8 3.5 3.0*  CL 106 102 109  CO2 22 23 22   BUN 13 13 15   CREATININE 1.39* 1.48* 1.50*  CALCIUM 8.6* 8.4* 7.7*  PROT  --  6.4*  --   BILITOT  --  0.8  --   ALKPHOS  --  28*  --   ALT  --  14  --   AST  --  22  --   GLUCOSE 135* 133* 180*    Imaging/Diagnostic Tests: No results found.  Patriciaann Clan, DO 01/02/2019, 7:57 AM PGY-1, [employer] Intern pager: [phone], text pages welcome

## 2019-01-03 ENCOUNTER — Inpatient Hospital Stay (HOSPITAL_COMMUNITY): Payer: Medicare HMO

## 2019-01-03 DIAGNOSIS — A045 Campylobacter enteritis: Secondary | ICD-10-CM

## 2019-01-03 DIAGNOSIS — E869 Volume depletion, unspecified: Secondary | ICD-10-CM

## 2019-01-03 DIAGNOSIS — E1159 Type 2 diabetes mellitus with other circulatory complications: Secondary | ICD-10-CM

## 2019-01-03 DIAGNOSIS — I361 Nonrheumatic tricuspid (valve) insufficiency: Secondary | ICD-10-CM

## 2019-01-03 LAB — CBC WITH DIFFERENTIAL/PLATELET
Abs Immature Granulocytes: 0.02 10*3/uL (ref 0.00–0.07)
Basophils Absolute: 0 10*3/uL (ref 0.0–0.1)
Basophils Relative: 0 %
EOS PCT: 1 %
Eosinophils Absolute: 0.1 10*3/uL (ref 0.0–0.5)
HCT: 31.3 % — ABNORMAL LOW (ref 36.0–46.0)
Hemoglobin: 9.9 g/dL — ABNORMAL LOW (ref 12.0–15.0)
Immature Granulocytes: 0 %
LYMPHS PCT: 33 %
Lymphs Abs: 1.6 10*3/uL (ref 0.7–4.0)
MCH: 29.5 pg (ref 26.0–34.0)
MCHC: 31.6 g/dL (ref 30.0–36.0)
MCV: 93.2 fL (ref 80.0–100.0)
Monocytes Absolute: 1 10*3/uL (ref 0.1–1.0)
Monocytes Relative: 21 %
Neutro Abs: 2.1 10*3/uL (ref 1.7–7.7)
Neutrophils Relative %: 45 %
Platelets: 126 10*3/uL — ABNORMAL LOW (ref 150–400)
RBC: 3.36 MIL/uL — ABNORMAL LOW (ref 3.87–5.11)
RDW: 14.2 % (ref 11.5–15.5)
WBC: 4.9 10*3/uL (ref 4.0–10.5)
nRBC: 0 % (ref 0.0–0.2)

## 2019-01-03 LAB — BASIC METABOLIC PANEL
Anion gap: 8 (ref 5–15)
BUN: 15 mg/dL (ref 8–23)
CO2: 18 mmol/L — ABNORMAL LOW (ref 22–32)
Calcium: 7.6 mg/dL — ABNORMAL LOW (ref 8.9–10.3)
Chloride: 115 mmol/L — ABNORMAL HIGH (ref 98–111)
Creatinine, Ser: 1.52 mg/dL — ABNORMAL HIGH (ref 0.44–1.00)
GFR calc Af Amer: 36 mL/min — ABNORMAL LOW (ref >60)
GFR, EST NON AFRICAN AMERICAN: 31 mL/min — AB (ref >60)
Glucose, Bld: 141 mg/dL — ABNORMAL HIGH (ref 70–99)
POTASSIUM: 4.4 mmol/L (ref 3.5–5.1)
Sodium: 141 mmol/L (ref 135–145)

## 2019-01-03 LAB — ECHOCARDIOGRAM COMPLETE
Height: 66 in
Weight: 2803.2 oz

## 2019-01-03 LAB — URINE CULTURE: Culture: >=100000 — AB

## 2019-01-03 LAB — TROPONIN I

## 2019-01-03 LAB — LACTOFERRIN, FECAL, QUALITATIVE: Lactoferrin, Fecal, Qual: POSITIVE — AB

## 2019-01-03 MED ORDER — ACETAMINOPHEN 325 MG PO TABS
650.0000 mg | ORAL_TABLET | Freq: Four times a day (QID) | ORAL | Status: DC
  Administered 2019-01-03 – 2019-01-07 (×15): 650 mg via ORAL
  Filled 2019-01-03 (×15): qty 2

## 2019-01-03 MED ORDER — LIDOCAINE VISCOUS HCL 2 % MT SOLN
15.0000 mL | Freq: Once | OROMUCOSAL | Status: AC
  Administered 2019-01-03: 15 mL via ORAL
  Filled 2019-01-03: qty 15

## 2019-01-03 MED ORDER — DILTIAZEM HCL 60 MG PO TABS
90.0000 mg | ORAL_TABLET | Freq: Three times a day (TID) | ORAL | Status: AC
  Administered 2019-01-03 – 2019-01-04 (×5): 90 mg via ORAL
  Filled 2019-01-03 (×5): qty 2

## 2019-01-03 MED ORDER — DILTIAZEM HCL 60 MG PO TABS: 60.0000 mg | ORAL_TABLET | Freq: Three times a day (TID) | ORAL | Status: DC

## 2019-01-03 MED ORDER — ALUM & MAG HYDROXIDE-SIMETH 200-200-20 MG/5ML PO SUSP
30.0000 mL | Freq: Once | ORAL | Status: AC
  Administered 2019-01-03: 30 mL via ORAL
  Filled 2019-01-03: qty 30

## 2019-01-03 MED ORDER — PANTOPRAZOLE SODIUM 40 MG PO TBEC
40.0000 mg | DELAYED_RELEASE_TABLET | Freq: Every day | ORAL | Status: DC
  Administered 2019-01-03 – 2019-01-07 (×5): 40 mg via ORAL
  Filled 2019-01-03 (×5): qty 1

## 2019-01-03 MED ORDER — APIXABAN 2.5 MG PO TABS
2.5000 mg | ORAL_TABLET | Freq: Two times a day (BID) | ORAL | Status: DC
  Administered 2019-01-03 – 2019-01-04 (×2): 2.5 mg via ORAL
  Filled 2019-01-03 (×2): qty 1

## 2019-01-03 NOTE — Progress Notes (Signed)
Family Medicine Teaching Service Daily Progress Note Intern Pager: [phone]  Patient name: Anne Roach Medical record number: [id number] Date of birth: 11-28-1932 Age: 85 y.o. Gender: female  Primary Care Provider: Lucianne Lei, MD Consultants: Cards Code Status: Full   Pt Overview and Major Events to Date:  Admitted 2/15  Assessment and Plan:  Anne Roach is a 85 y.o. female admitted for dehydration, fever, chills, likely 2/2 acute colitis. Her chronic conditions include Afib just started on eliquis on 12/30/18, OSA,. HTN, DM. Found to have bacteuria in ED.    #Fever, chills and dehydration likely 2/2 acute colitis: Acute, improving Patient continues to endorse frequent diarrhea, fatigue, and weakness.  Minimally eating.  Vitals improving, last febrile yesterday morning.  Appears tired on exam, diffuse abdominal tenderness.  GI panel detected Campylobacter.  Fecal occult positive, to be expected with invasive species.  Blood cultures no growth at 2 days.  - Enteric precautions -Continue to monitor CBC, BMP, blood culture - DC'd Cipro and metronidazole - Continue azithromycin and Keflex - Continue IV fluid resuscitation with D5, 125 mL - Strict I's and O's - Monitor vitals -GI cocktail as needed - Tylenol scheduled  # Paroxsymal Afib w/ RVR likely secondary to acute viral illness: acute on chronic.  Continues to endorse palpitations.  Heart rate has improved since admission, currently on diltiazem oral.  BP remaining stable.  - Cards consulted, will follow up outpatient in 3 weeks -Continue anticoagulation with Eliquis, transition to 2.5 mg twice daily on 2/19 - Echocardiogram -Daily EKG -Monitor BMP, electrolytes  #UTI:  Improve symptoms, > 100,000 E. coli noted in urine culture. - Currently on Keflex which should cover this - Continue to monitor symptoms  #CKD III: Acute on chronic with elevated creatinine. Creatinine 1.52 today, from 1.38 on admission.  Likely  in the setting of dehydration, continued fluid losses. -Continue to monitor BMP -Avoid nephrotoxic medications -Encourage oral rehydration - Continue IV fluids  Chronic issues: # DM:  Glucose 115 this a.m.  No A1c documented.  Only takes metformin 750 mg at home. - Follow-up A1c - Holding home metformin  # HTN:  Currently hypotensive to normotensive during stay.  Map remaining above 65, monitoring closely with diltiazem. - Continue to hold home amlodipine and irbesartan-HCTZ   # Family Hx Alzheimers Patient without symptoms.  -Continue home Namenda 10 mg (started in 2015)  #HIsotry Lung nodule on CT  No  Evidence of follow up chest CT  -Out patient follow up w/ new PCP, Dr. Norris Cross   #History of Pressure injury 03/21/17 Stage 1 -Wound care  #FEN/GI:   Fluids: 162mL/hr   Electrolytes: Mg, s/p 2g   Zofran for nausea  Nutrition:  Heart Healthy  Access: Left and RT PIV VTE prophylaxis: Eliquis  Disposition: Continued inpatient care  Subjective:  No acute events overnight.  She continues to feel exhausted and weak this morning.  Endorses allover abdominal pain.  She states she was up all night with continued diarrhea, already had 4 episodes of watery bowel movements this morning.  Denies any melena or hematochezia.  She still has not been able to eat much at all, willing to try some applesauce this morning.  Has been drinking some fluids.  Denies any chest pain, difficulty breathing.  However does continue to endorse some palpitations.    Objective: Temp:  [98 F (36.7 C)-100 F (37.8 C)] 98.6 F (37 C) (02/17 0516) Pulse Rate:  [62-88] 87 (02/17 0516) Resp:  [16-21]  16 (02/17 0516) BP: (99-112)/(49-67) 107/55 (02/17 0900) SpO2:  [85 %-98 %] 98 % (02/17 0516) Weight:  [79.5 kg] 79.5 kg (02/17 0516) Physical Exam: General: Alert, appears tired and pale, however no acute distress HEENT: NCAT, MMM Cardiac: Irregularly irregular rate and rhythm, no murmurs  noted Lungs: Clear bilaterally, no increased WOB  Abdomen: soft, tender throughout without any rebounding or guarding, nondistended, normoactive bowel sounds. Msk: Moves all extremities spontaneously  Ext: Warm, dry, 2+ distal pulses, no edema Derm: Skin is clammy  Laboratory: Recent Labs  Lab 01/01/19 1000 01/02/19 0527 01/02/19 1006  WBC 7.4 5.2 4.8  HGB 11.2* 10.7* 10.5*  HCT 35.6* 33.2* 33.8*  PLT 140* 129* 132*   Recent Labs  Lab 01/01/19 1000 01/02/19 0527 01/03/19 0644  NA 135 137 141  K 3.5 3.0* 4.4  CL 102 109 115*  CO2 23 22 18*  BUN 13 15 15   CREATININE 1.48* 1.50* 1.52*  CALCIUM 8.4* 7.7* 7.6*  PROT 6.4*  --   --   BILITOT 0.8  --   --   ALKPHOS 28*  --   --   ALT 14  --   --   AST 22  --   --   GLUCOSE 133* 180* 141*    Imaging/Diagnostic Tests: No results found.  Patriciaann Clan, DO 01/03/2019, 9:56 AM PGY-1, [place] Intern pager: [phone], text pages welcome

## 2019-01-03 NOTE — Progress Notes (Signed)
  Echocardiogram 2D Echocardiogram has been performed.  Anne Roach 01/03/2019, 3:14 PM

## 2019-01-03 NOTE — Progress Notes (Signed)
Notified by infection prevention to discontinue enteric precautions. Per IP, since patient is not incontinent of bowel, she does not require enteric precautions for campylobacter.

## 2019-01-03 NOTE — Progress Notes (Addendum)
The patient has been seen in conjunction with Harlan Stains, NP. All aspects of care have been considered and discussed. The patient has been personally interviewed, examined, and all clinical data has been reviewed.   Most recent documentation of atrial fibrillation prior to this admission was in May 2018.  On this admission has had atrial fibrillation and possibly atrial tachycardia with AV block.  Blood pressures are soft.  Currently on oral diltiazem attempting rate control.  Today's echo is pending.  Could add digoxin for rate control without impacting blood pressure.  Otherwise as per the initial consultation provided by Dr. Johnsie Cancel 01/02/2019  Progress Note  Patient Name: Anne Roach Date of Encounter: 01/03/2019  Primary Cardiologist: No primary care provider on file.   Subjective   Feeling weak. No pain.   Inpatient Medications    Scheduled Meds: . apixaban  5 mg Oral BID  . azithromycin  500 mg Oral Q24H  . cephALEXin  500 mg Oral Q12H  . diltiazem  60 mg Oral Q8H  . memantine  10 mg Oral Daily  . sodium chloride flush  3 mL Intravenous Once   Continuous Infusions: . dextrose 5 % and 0.9 % NaCl with KCl 40 mEq/L 125 mL/hr at 01/03/19 0928   PRN Meds: acetaminophen, [COMPLETED] loperamide **FOLLOWED BY** loperamide, ondansetron (ZOFRAN) IV   Vital Signs    Vitals:   01/02/19 2046 01/03/19 0015 01/03/19 0516 01/03/19 0900  BP: (!) 110/58 (!) 101/51 106/67 (!) 107/55  Pulse: 64 66 87   Resp:  19 16   Temp:  98 F (36.7 C) 98.6 F (37 C)   TempSrc:  Axillary Axillary   SpO2: (!) 85% 97% 98%   Weight:   79.5 kg   Height:        Intake/Output Summary (Last 24 hours) at 01/03/2019 0954 Last data filed at 01/03/2019 0700 Gross per 24 hour  Intake 560 ml  Output 102 ml  Net 458 ml   Last 3 Weights 01/03/2019 01/02/2019 01/01/2019  Weight (lbs) 175 lb 3.2 oz 175 lb 4.8 oz 169 lb 14.4 oz  Weight (kg) 79.47 kg 79.516 kg 77.066 kg       Telemetry    Afib rate 110s - Personally Reviewed  ECG    Atrial Flutter 130s - Personally Reviewed  Physical Exam   GEN: No acute distress. Ill appearing older female Neck: No JVD Cardiac: Irreg Irreg, no murmurs, rubs, or gallops.  Respiratory: Clear to auscultation bilaterally. GI: Soft, nontender, non-distended  MS: Mild bilateral edema ; No deformity. Neuro:  Nonfocal  Psych: Normal affect   Labs    Chemistry Recent Labs  Lab 01/01/19 1000 01/02/19 0527 01/03/19 0644  NA 135 137 141  K 3.5 3.0* 4.4  CL 102 109 115*  CO2 23 22 18*  GLUCOSE 133* 180* 141*  BUN 13 15 15   CREATININE 1.48* 1.50* 1.52*  CALCIUM 8.4* 7.7* 7.6*  PROT 6.4*  --   --   ALBUMIN 3.0*  --   --   AST 22  --   --   ALT 14  --   --   ALKPHOS 28*  --   --   BILITOT 0.8  --   --   GFRNONAA 32* 32* 31*  GFRAA 38* 37* 36*  ANIONGAP 10 6 8      Hematology Recent Labs  Lab 01/01/19 1000 01/02/19 0527 01/02/19 1006  WBC 7.4 5.2 4.8  RBC 3.88 3.66* 3.64*  HGB 11.2* 10.7* 10.5*  HCT 35.6* 33.2* 33.8*  MCV 91.8 90.7 92.9  MCH 28.9 29.2 28.8  MCHC 31.5 32.2 31.1  RDW 13.5 13.8 13.7  PLT 140* 129* 132*    Cardiac Enzymes Recent Labs  Lab 12/30/18 1305 01/02/19 1006  TROPONINI <0.03 <0.03    Recent Labs  Lab 12/30/18 1034 01/01/19 1022  TROPIPOC 0.00 0.00     BNPNo results for input(s): BNP, PROBNP in the last 168 hours.   DDimer No results for input(s): DDIMER in the last 168 hours.   Radiology    Dg Chest Port 1 View  Result Date: 01/01/2019 CLINICAL DATA:  Sepsis.  History of hypertension and diabetes. EXAM: PORTABLE CHEST 1 VIEW COMPARISON:  12/30/2018 FINDINGS: Cardiac silhouette is mildly enlarged. No mediastinal or hilar masses. There is no evidence of adenopathy. Clear lungs.  No pleural effusion or pneumothorax. Skeletal structures are grossly intact. IMPRESSION: No acute cardiopulmonary disease. Electronically Signed   By: Lajean Manes M.D.   On: 01/01/2019  10:52    Cardiac Studies   TTE: pending  Patient Profile     85 y.o. female with PMH of HTN, DM, OSA, PAF who presented with Afib in the setting of fever/infection.   Assessment & Plan    1. Afib RVR: placed on IV dilt on admission, now transitioned to PO. Rates are still elevated. Will transition to 90mg  TID as her blood pressures are soft at times. Eliquis dosed at 5mg  BID, but Cr climbing and now with + FOBT. Will reduce to 2.5mg  BID today.  -- TSH normal  2. Fever with UTI/colitis: on antibiotics per primary. No fever since yesterday morning.  3. AKI: Cr up to 1.52 today. May be 2/2 dehydration on admission, though has been receiving IVFs. Continue to hold HCTZ/ARB    For questions or updates, please contact [place] Please consult [email or web address] for contact info under        Signed, Reino Bellis, NP  01/03/2019, 9:54 AM

## 2019-01-03 NOTE — Progress Notes (Signed)
Patient c/o 8/10 CP; pointing to center of chest. Denies SOB and unable to describe pain further. Grimacing and crying. PRN tylenol given and EKG obtained; notified both family medicine and cardiology. Family medicine to order GI cocktail. Will continue to monitor patient.

## 2019-01-04 ENCOUNTER — Inpatient Hospital Stay (HOSPITAL_COMMUNITY): Payer: Medicare HMO

## 2019-01-04 DIAGNOSIS — E8770 Fluid overload, unspecified: Secondary | ICD-10-CM

## 2019-01-04 DIAGNOSIS — E8771 Transfusion associated circulatory overload: Secondary | ICD-10-CM

## 2019-01-04 LAB — CBC WITH DIFFERENTIAL/PLATELET
Abs Immature Granulocytes: 0.04 10*3/uL (ref 0.00–0.07)
Basophils Absolute: 0 10*3/uL (ref 0.0–0.1)
Basophils Relative: 1 %
Eosinophils Absolute: 0.2 10*3/uL (ref 0.0–0.5)
Eosinophils Relative: 3 %
HCT: 30.7 % — ABNORMAL LOW (ref 36.0–46.0)
Hemoglobin: 9.7 g/dL — ABNORMAL LOW (ref 12.0–15.0)
Immature Granulocytes: 1 %
Lymphocytes Relative: 35 %
Lymphs Abs: 2.1 10*3/uL (ref 0.7–4.0)
MCH: 29.5 pg (ref 26.0–34.0)
MCHC: 31.6 g/dL (ref 30.0–36.0)
MCV: 93.3 fL (ref 80.0–100.0)
MONOS PCT: 17 %
Monocytes Absolute: 1 10*3/uL (ref 0.1–1.0)
Neutro Abs: 2.5 10*3/uL (ref 1.7–7.7)
Neutrophils Relative %: 43 %
Platelets: 149 10*3/uL — ABNORMAL LOW (ref 150–400)
RBC: 3.29 MIL/uL — ABNORMAL LOW (ref 3.87–5.11)
RDW: 14.6 % (ref 11.5–15.5)
WBC: 5.9 10*3/uL (ref 4.0–10.5)
nRBC: 0 % (ref 0.0–0.2)

## 2019-01-04 LAB — BASIC METABOLIC PANEL
Anion gap: 6 (ref 5–15)
BUN: 12 mg/dL (ref 8–23)
CALCIUM: 7.9 mg/dL — AB (ref 8.9–10.3)
CO2: 19 mmol/L — ABNORMAL LOW (ref 22–32)
Chloride: 115 mmol/L — ABNORMAL HIGH (ref 98–111)
Creatinine, Ser: 1.32 mg/dL — ABNORMAL HIGH (ref 0.44–1.00)
GFR calc Af Amer: 43 mL/min — ABNORMAL LOW (ref >60)
GFR calc non Af Amer: 37 mL/min — ABNORMAL LOW (ref >60)
Glucose, Bld: 127 mg/dL — ABNORMAL HIGH (ref 70–99)
Potassium: 5 mmol/L (ref 3.5–5.1)
SODIUM: 140 mmol/L (ref 135–145)

## 2019-01-04 LAB — MAGNESIUM: Magnesium: 1.7 mg/dL (ref 1.7–2.4)

## 2019-01-04 LAB — HEMOGLOBIN A1C
Hgb A1c MFr Bld: 6.4 % — ABNORMAL HIGH (ref 4.8–5.6)
Mean Plasma Glucose: 137 mg/dL

## 2019-01-04 LAB — CALPROTECTIN, FECAL: CALPROTECTIN, FECAL: 599 ug/g — AB (ref 0–120)

## 2019-01-04 MED ORDER — FUROSEMIDE 10 MG/ML IJ SOLN
20.0000 mg | Freq: Once | INTRAMUSCULAR | Status: AC
  Administered 2019-01-04: 20 mg via INTRAVENOUS
  Filled 2019-01-04: qty 2

## 2019-01-04 MED ORDER — FUROSEMIDE 10 MG/ML IJ SOLN
40.0000 mg | Freq: Once | INTRAMUSCULAR | Status: AC
  Administered 2019-01-05: 40 mg via INTRAVENOUS
  Filled 2019-01-04: qty 4

## 2019-01-04 MED ORDER — APIXABAN 5 MG PO TABS
5.0000 mg | ORAL_TABLET | Freq: Two times a day (BID) | ORAL | Status: DC
  Administered 2019-01-04 – 2019-01-07 (×6): 5 mg via ORAL
  Filled 2019-01-04 (×2): qty 1
  Filled 2019-01-04: qty 2
  Filled 2019-01-04 (×3): qty 1

## 2019-01-04 MED ORDER — DEXTROSE IN LACTATED RINGERS 5 % IV SOLN
INTRAVENOUS | Status: DC
  Administered 2019-01-04: 10:00:00 via INTRAVENOUS

## 2019-01-04 MED ORDER — DILTIAZEM HCL ER COATED BEADS 240 MG PO CP24
240.0000 mg | ORAL_CAPSULE | Freq: Every day | ORAL | Status: DC
  Administered 2019-01-05 – 2019-01-07 (×3): 240 mg via ORAL
  Filled 2019-01-04 (×3): qty 1

## 2019-01-04 NOTE — Care Management Important Message (Signed)
Important Message  Patient Details  Name: Anne Roach MRN: [id number] Date of Birth: 12-Dec-1932   Medicare Important Message Given:  Yes    Gracelynn Bircher P Albina Gosney 01/04/2019, 1:08 PM

## 2019-01-04 NOTE — Evaluation (Signed)
Physical Therapy Evaluation Patient Details Name: Anne Roach MRN: [id number] DOB: 1933/04/06 Today's Date: 01/04/2019   History of Present Illness  Anne Roach is a 85 y.o. female admitted for dehydration, fever, chills, 2/2 acute colitis. Her chronic conditions include Afib just started on eliquis on 12/30/18, OSA,. HTN, DM.  Clinical Impression  Patient presents with decreased mobility though unknown baseline as pt is poor historian.  Attempted to call daughter and LVM.  Patient presents with decreased mobility and reported significant fear of falling.  She is currently min-guard to min A for mobility OOB to chair. Per her report was having difficulty getting around on her own in w/c at home while daughter working.  She may need more help at home if possible and follow up HHPT.  Will continue to contact family for best d/c disposition.     Follow Up Recommendations Supervision - Intermittent;Home health PT    Equipment Recommendations  None recommended by PT    Recommendations for Other Services       Precautions / Restrictions Precautions Precautions: Fall      Mobility  Bed Mobility Overal bed mobility: Needs Assistance Bed Mobility: Supine to Sit     Supine to sit: Min assist;HOB elevated     General bed mobility comments: pulls up with therapist help  Transfers Overall transfer level: Needs assistance Equipment used: Rolling walker (2 wheeled) Transfers: Sit to/from [facility] Sit to Stand: Min assist Stand pivot transfers: Min assist       General transfer comment: for balance, safety; stand step to chair no ambulation as pt reported fear of falling.  Ambulation/Gait                Stairs            Wheelchair Mobility    Modified Rankin (Stroke Patients Only)       Balance Overall balance assessment: Needs assistance   Sitting balance-Leahy Scale: Fair       Standing balance-Leahy Scale: Poor Standing  balance comment: UE support needed and pt fearful of falling per her report                             Pertinent Vitals/Pain Pain Assessment: No/denies pain    Home Living Family/patient expects to be discharged to:: Private residence Living Arrangements: Children(daughter) Available Help at Discharge: Family;Available PRN/intermittently Type of Home: House Home Access: Level entry              Prior Function Level of Independence: Needs assistance   Gait / Transfers Assistance Needed: unclear as pt poor historian and daughter not available, reports she gets into wheelchair at home, but unable to propel herself, reports daughter is professor and not there till late in afternoon.            Hand Dominance        Extremity/Trunk Assessment   Upper Extremity Assessment Upper Extremity Assessment: Generalized weakness    Lower Extremity Assessment Lower Extremity Assessment: Generalized weakness(reports numbness up to thighs to light touch)       Communication   Communication: Other (comment)(hesitant, long dely in answering questions)  Cognition Arousal/Alertness: Awake/alert Behavior During Therapy: Flat affect Overall Cognitive Status: No family/caregiver present to determine baseline cognitive functioning Area of Impairment: Orientation;Following commands;Problem solving                 Orientation Level: Place;Time  Following Commands: Follows one step commands with increased time;Follows one step commands inconsistently     Problem Solving: Slow processing;Decreased initiation;Requires verbal cues;Requires tactile cues        General Comments      Exercises     Assessment/Plan    PT Assessment Patient needs continued PT services  PT Problem List Decreased strength;Decreased mobility;Decreased activity tolerance;Decreased balance;Decreased knowledge of use of DME;Decreased safety awareness       PT Treatment Interventions  DME instruction;Functional mobility training;Balance training;Patient/family education;Gait training;Therapeutic exercise;Therapeutic activities    PT Goals (Current goals can be found in the Care Plan section)  Acute Rehab PT Goals Patient Stated Goal: reports she needs more help during the day PT Goal Formulation: Patient unable to participate in goal setting Time For Goal Achievement: 01/11/19 Potential to Achieve Goals: Fair    Frequency Min 3X/week   Barriers to discharge        Co-evaluation               AM-PAC PT "6 Clicks" Mobility  Outcome Measure Help needed turning from your back to your side while in a flat bed without using bedrails?: A Little Help needed moving from lying on your back to sitting on the side of a flat bed without using bedrails?: A Little Help needed moving to and from a bed to a chair (including a wheelchair)?: A Little Help needed standing up from a chair using your arms (e.g., wheelchair or bedside chair)?: A Little Help needed to walk in hospital room?: A Lot Help needed climbing 3-5 steps with a railing? : A Lot 6 Click Score: 16    End of Session Equipment Utilized During Treatment: Gait belt Activity Tolerance: Patient limited by fatigue Patient left: with call bell/phone within reach;in chair;with chair alarm set Nurse Communication: Mobility status PT Visit Diagnosis: Other abnormalities of gait and mobility (R26.89);Muscle weakness (generalized) (M62.81)    Time: 1610-9604 PT Time Calculation (min) (ACUTE ONLY): 18 min   Charges:   PT Evaluation $PT Eval Moderate Complexity: [place], PT Acute Rehabilitation Services [phone] 01/04/2019   Reginia Naas 01/04/2019, 1:08 PM

## 2019-01-04 NOTE — Progress Notes (Signed)
Progress Note  Patient Name: Anne Roach Date of Encounter: 01/04/2019  Primary Cardiologist: No primary care provider on file.   Subjective   Feeling weak, achy all over. Sitting up in the chair today.   Inpatient Medications    Scheduled Meds: . acetaminophen  650 mg Oral Q6H  . apixaban  5 mg Oral BID  . azithromycin  500 mg Oral Q24H  . cephALEXin  500 mg Oral Q12H  . diltiazem  240 mg Oral Daily  . diltiazem  90 mg Oral Q8H  . memantine  10 mg Oral Daily  . pantoprazole  40 mg Oral Daily  . sodium chloride flush  3 mL Intravenous Once   Continuous Infusions: . dextrose 5% lactated ringers     PRN Meds: ondansetron (ZOFRAN) IV   Vital Signs    Vitals:   01/03/19 1452 01/03/19 2139 01/03/19 2344 01/04/19 0610  BP: 115/71 105/61  111/75  Pulse:  69 64 79  Resp:  18  20  Temp: 99 F (37.2 C) 98.9 F (37.2 C)  98.3 F (36.8 C)  TempSrc:  Oral  Oral  SpO2:  97% 94%   Weight:    82.2 kg  Height:        Intake/Output Summary (Last 24 hours) at 01/04/2019 1001 Last data filed at 01/04/2019 0925 Gross per 24 hour  Intake 4360.23 ml  Output 350 ml  Net 4010.23 ml   Last 3 Weights 01/04/2019 01/03/2019 01/02/2019  Weight (lbs) 181 lb 3.2 oz 175 lb 3.2 oz 175 lb 4.8 oz  Weight (kg) 82.192 kg 79.47 kg 79.516 kg      Telemetry    Afib, rate controlled - Personally Reviewed  ECG    N/a - Personally Reviewed  Physical Exam   GEN: No acute distress. Ill appearing   Neck: No JVD Cardiac: Irreg Irreg, no murmurs, rubs, or gallops.  Respiratory: Clear to auscultation bilaterally. GI: Soft, nontender, non-distended  MS: No edema; No deformity. Neuro:  Nonfocal  Psych: Normal affect   Labs    Chemistry Recent Labs  Lab 01/01/19 1000 01/02/19 0527 01/03/19 0644 01/04/19 0407  NA 135 137 141 140  K 3.5 3.0* 4.4 5.0  CL 102 109 115* 115*  CO2 23 22 18* 19*  GLUCOSE 133* 180* 141* 127*  BUN 13 15 15 12   CREATININE 1.48* 1.50* 1.52* 1.32*    CALCIUM 8.4* 7.7* 7.6* 7.9*  PROT 6.4*  --   --   --   ALBUMIN 3.0*  --   --   --   AST 22  --   --   --   ALT 14  --   --   --   ALKPHOS 28*  --   --   --   BILITOT 0.8  --   --   --   GFRNONAA 32* 32* 31* 37*  GFRAA 38* 37* 36* 43*  ANIONGAP 10 6 8 6      Hematology Recent Labs  Lab 01/02/19 1006 01/03/19 0644 01/04/19 0407  WBC 4.8 4.9 5.9  RBC 3.64* 3.36* 3.29*  HGB 10.5* 9.9* 9.7*  HCT 33.8* 31.3* 30.7*  MCV 92.9 93.2 93.3  MCH 28.8 29.5 29.5  MCHC 31.1 31.6 31.6  RDW 13.7 14.2 14.6  PLT 132* 126* 149*    Cardiac Enzymes Recent Labs  Lab 12/30/18 1305 01/02/19 1006 01/03/19 1256  TROPONINI <0.03 <0.03 <0.03    Recent Labs  Lab 12/30/18 1034 01/01/19 1022  TROPIPOC 0.00 0.00     BNPNo results for input(s): BNP, PROBNP in the last 168 hours.   DDimer No results for input(s): DDIMER in the last 168 hours.   Radiology    No results found.  Cardiac Studies   TTE: 01/03/2019  IMPRESSIONS    1. The left ventricle has normal systolic function with an ejection fraction of 60-65%. The cavity size was normal. There is mildly increased left ventricular wall thickness. Left ventricular diastolic Doppler parameters are indeterminate secondary to  atrial fibrillation.  2. The right ventricle has normal systolic function. The cavity was normal. There is no increase in right ventricular wall thickness.  3. Left atrial size was mildly dilated.  4. Small pericardial effusion.  5. The pericardial effusion is posterior.  6. The mitral valve is myxomatous. Mild calcification of the mitral valve leaflet. Mitral valve regurgitation is mild to moderate by color flow Doppler.  7. The tricuspid valve is normal in structure.  8. The aortic valve is tricuspid Mild sclerosis of the aortic valve. Aortic valve regurgitation is trivial by color flow Doppler.  9. Right atrial pressure is estimated at 10 mmHg. 10. The interatrial septum was not well visualized. 11. When  compared to the prior study: Compared to a prior study in 03/2017, there are no significant changes.  Patient Profile     85 y.o. female with PMH of HTN, DM, OSA, PAF who presented with Afib in the setting of fever/infection.   Assessment & Plan   1. Afib RVR: Will further consolidate Dilt to 240mg  daily in the morning. Cr better today, therefore continue Eliquis at 5mg  BID.  2. Fever with UTI/colitis: camplyobacter infection. On antibiotics per primary. No fever since yesterday morning.  3. AKI: Cr now improved to 1.32 today. Receiving IVFs, though need to be cautious as her weight is climbing. Continue to hold HCTZ/ARB     For questions or updates, please contact [place] Please consult [email or web address] for contact info under        Signed, Reino Bellis, NP  01/04/2019, 10:01 AM

## 2019-01-04 NOTE — Progress Notes (Signed)
Cardiologist on call notified of pt having a 2.04s pause. Pt asymptomatic & resting. Will continue to monitor pt & implement any new orders. Hoover Brunette, RN

## 2019-01-04 NOTE — Progress Notes (Signed)
FPTS Interim Progress Note  S: Went to bedside at request of day team physician as patient is watcher status.   O: BP 105/61 (BP Location: Right Arm)   Pulse 64   Temp 98.9 F (37.2 C) (Oral)   Resp 18   Ht 5\' 6"  (1.676 m)   Wt 79.5 kg   SpO2 94%   BMI 28.28 kg/m   Appears less pale. No diaphoresis. Patient smiles kindly. NAD.   A/P: Improving clinically. Complains of epigastric pain, no chest pain at this time.   GI cocktail   Wilber Oliphant, MD 01/04/2019, 12:44 AM PGY-1, [place] Medicine Service pager [phone]

## 2019-01-04 NOTE — Progress Notes (Signed)
Family Medicine Teaching Service Daily Progress Note Intern Pager: [phone]  Patient name: Anne Roach Medical record number: [id number] Date of birth: 06-10-1933 Age: 85 y.o. Gender: female  Primary Care Provider: Lucianne Lei, MD Consultants: Cards Code Status: Full   Pt Overview and Major Events to Date:  Admitted 2/15  Assessment and Plan:  Anne Roach is a 85 y.o. female admitted for dehydration, fever, chills, 2/2 acute colitis. Her chronic conditions include Afib just started on eliquis on 12/30/18, OSA,. HTN, DM.    Campylobacter enteritis: Acute, improving Patient continues to endorse frequent diarrhea, fatigue, and weakness.  Vitals stable, remaining afebrile for the last 2 days. Appears tired on exam, however less diaphoretic.  Fecal lactoferrin positive, however expected with bacterial infection.  Blood cultures NGTD. - Continue azithromycin 500 mg daily (2/17-)  - DC fluids for below - Continue to monitor CBC, BMP, blood cultures - Strict I's and O's - GI cocktail with Maalox and viscous lidocaine as needed for abdominal pain -Tylenol scheduled - Monitor vitals - PT evaluate and treat, has been sedentary last several days  New oxygen requirement: Acute.  Nursing notes she has intermittently been using 2L of oxygen, mainly when she is asleep.  She does not use any oxygen at home.  No CP, coughing, or SOB.  Bibasilar crackles on exam, no lower extremity swelling.  May be related to continued fluid resuscitation, however she also has a lot of GI losses.  On a strict I's and O's, however U OP is minimal.  EF 60-65% on echo done during this hospitalization.  Unlikely related to pneumonia, afebrile without any recent cough or sputum production. - Obtain CXR - Bladder scan - Incentive spirometer - DC fluid infusion, likely will need Lasix - Oxygen as needed  Paroxsymal Afib w/ RVR likely secondary to acute viral illness: acute on chronic.  No further palpitations  this morning.  Heart rate has improved since admission, currently on diltiazem oral.  BP remaining stable.  Echo with EF 60-65%. - Cards consulted, will follow up outpatient in 3 weeks -Continue anticoagulation with Eliquis, now on 2.5 twice daily per pharmacy -Daily EKG -Monitor BMP, electrolytes  UTI:  > 100,000 E. coli noted in urine culture. - Currently on Keflex (2/17-) - Continue to monitor symptoms  CKD III: Improved Back to around baseline, creatinine 1.32 today, from 1.52.   -Continue to monitor BMP -Avoid nephrotoxic medications -Encourage oral rehydration - Continue IV fluids  Chronic issues: # DM:  Glucose 115 this a.m.  No A1c documented.  Only takes metformin 750 mg at home. - Follow-up A1c - Holding home metformin  # HTN:  Currently hypotensive to normotensive during stay.  Map remaining above 65, monitoring closely with diltiazem. - Continue to hold home amlodipine and irbesartan-HCTZ   # Family Hx Alzheimers Patient without symptoms.  -Continue home Namenda 10 mg (started in 2015)  #HIsotry Lung nodule on CT  No  Evidence of follow up chest CT  -Out patient follow up w/ new PCP, Dr. Norris Cross   #History of Pressure injury 03/21/17 Stage 1 -Wound care  #FEN/GI:   Fluids: 132mL/hr   Electrolytes: Mg, s/p 2g   Zofran for nausea  Nutrition:  Heart Healthy  Access: Left and RT PIV VTE prophylaxis: Eliquis  Disposition: Continued inpatient care  Subjective:  No acute events overnight.  Spoke with nursing, states she has been requiring oxygen mainly at night or when she takes a nap.  Patient states she  is feeling okay this morning, still very fatigued and weak.  Also endorses continued profuse diarrhea, green.  Was unable to sleep much last night due to this.  Abdominal pain is a little better this morning, however still present throughout.  She also notes that her bilateral lower extremities feel tender.  She is only able to eat a yogurt  yesterday.  Has been drinking some water and cranberry juice.  Denies any chest pain, shortness of breath, coughing, sputum production, palpitations, vomiting.  Denies melena or hematochezia.  Objective: Temp:  [98.3 F (36.8 C)-99 F (37.2 C)] 98.3 F (36.8 C) (02/18 0610) Pulse Rate:  [64-79] 79 (02/18 0610) Resp:  [18-20] 20 (02/18 0610) BP: (105-115)/(55-75) 111/75 (02/18 0610) SpO2:  [94 %-97 %] 94 % (02/17 2344) Weight:  [82.2 kg] 82.2 kg (02/18 0610) Physical Exam: General: Alert, appears tired, in no acute distress HEENT: NCAT, MMM  Cardiac: Irregularly irregular rate and rhythm, no murmurs noted Lungs: Clear bilaterally, no increased WOB  Abdomen: soft, nondistended, normoactive bowel sounds, mildly tender throughout without any rebounding or guarding Msk: Moves all extremities spontaneously  Ext: Warm, dry, 2+ distal pulses, no edema  Skin: Less clammy, not diaphoretic  Laboratory: Recent Labs  Lab 01/02/19 1006 01/03/19 0644 01/04/19 0407  WBC 4.8 4.9 5.9  HGB 10.5* 9.9* 9.7*  HCT 33.8* 31.3* 30.7*  PLT 132* 126* 149*   Recent Labs  Lab 01/01/19 1000 01/02/19 0527 01/03/19 0644 01/04/19 0407  NA 135 137 141 140  K 3.5 3.0* 4.4 5.0  CL 102 109 115* 115*  CO2 23 22 18* 19*  BUN 13 15 15 12   CREATININE 1.48* 1.50* 1.52* 1.32*  CALCIUM 8.4* 7.7* 7.6* 7.9*  PROT 6.4*  --   --   --   BILITOT 0.8  --   --   --   ALKPHOS 28*  --   --   --   ALT 14  --   --   --   AST 22  --   --   --   GLUCOSE 133* 180* 141* 127*    Imaging/Diagnostic Tests: No results found.  Patriciaann Clan, DO 01/04/2019, 7:06 AM PGY-1, [place] Intern pager: [phone], text pages welcome

## 2019-01-04 NOTE — Progress Notes (Signed)
FPTS Interim Progress Note  S: Paged about patient dropping sats to 70's. Upon arrival to the room, patient was readjusted in her bed and satting in upper 90's on 4.5L, up from 2L per pt's nurse.   O: BP 123/80 (BP Location: Left Arm)   Pulse 80   Temp 97.9 F (36.6 C) (Oral)   Resp 17   Ht 5\' 6"  (1.676 m)   Wt 82.2 kg   SpO2 98%   BMI 29.25 kg/m    Appears comfortable. No increased WOB.  [place] in place  Easily aroused and wakes to name  Ambulates to [facility] by herself   Labs Cr 1.32   A/P: Patient with episodes of desats earlier in the day and CXR with B/L pulmonary edema vs atelectasis/consolidation.  Pt was given IV lasix 20mg  at 1500h with minimal urine output.  At bedside, patient has  in place at 4.5L. Patient is not in respiratory distress and appears stable. While asleep, she maintains sats in the 90's. She does snore, but pt not known to have dx of OSA. No documented fevers, no chest pain, no dyspnea. No increased ANC or evidence of bandemia on CBC. Hypoxemia likely due to combination of sleep related hypoxemia and pulmonary edema or atelectasis. Will order IV 40 mg as it appears that pt did not respond to initial dose. Most recent Cr value 1.32 which appears to be around her baseline in the setting of her CKD III. Would expect increase in Creatinine with AM labs.  . Incentive spirometry q hourly while patient awake . IV 40 mg lasix once . Monitor UOP . Pt will need nephrology follow up o/p for CKD (per chart review, no current nephrologist).   Wilber Oliphant, MD 01/04/2019, 10:54 PM PGY-1, [place] Medicine Service pager [phone]

## 2019-01-05 ENCOUNTER — Inpatient Hospital Stay (HOSPITAL_COMMUNITY): Payer: Medicare HMO

## 2019-01-05 LAB — COMPREHENSIVE METABOLIC PANEL
ALT: UNDETERMINED U/L (ref 0–44)
AST: 16 U/L (ref 15–41)
Albumin: 2.2 g/dL — ABNORMAL LOW (ref 3.5–5.0)
Alkaline Phosphatase: 22 U/L — ABNORMAL LOW (ref 38–126)
Anion gap: 6 (ref 5–15)
BUN: 11 mg/dL (ref 8–23)
CO2: 19 mmol/L — ABNORMAL LOW (ref 22–32)
CREATININE: 1.33 mg/dL — AB (ref 0.44–1.00)
Calcium: 7.9 mg/dL — ABNORMAL LOW (ref 8.9–10.3)
Chloride: 115 mmol/L — ABNORMAL HIGH (ref 98–111)
GFR calc non Af Amer: 37 mL/min — ABNORMAL LOW (ref >60)
GFR, EST AFRICAN AMERICAN: 43 mL/min — AB (ref >60)
Glucose, Bld: 105 mg/dL — ABNORMAL HIGH (ref 70–99)
Potassium: 3.7 mmol/L (ref 3.5–5.1)
Sodium: 140 mmol/L (ref 135–145)
Total Bilirubin: UNDETERMINED mg/dL (ref 0.3–1.2)
Total Protein: 5.1 g/dL — ABNORMAL LOW (ref 6.5–8.1)

## 2019-01-05 LAB — CBC WITH DIFFERENTIAL/PLATELET
BLASTS: 0 %
Band Neutrophils: 0 %
Basophils Absolute: 0.1 10*3/uL (ref 0.0–0.1)
Basophils Relative: 1 %
Eosinophils Absolute: 0.2 10*3/uL (ref 0.0–0.5)
Eosinophils Relative: 3 %
HCT: 31.7 % — ABNORMAL LOW (ref 36.0–46.0)
Hemoglobin: 10.2 g/dL — ABNORMAL LOW (ref 12.0–15.0)
Lymphocytes Relative: 36 %
Lymphs Abs: 2.4 10*3/uL (ref 0.7–4.0)
MCH: 29.6 pg (ref 26.0–34.0)
MCHC: 32.2 g/dL (ref 30.0–36.0)
MCV: 91.9 fL (ref 80.0–100.0)
METAMYELOCYTES PCT: 0 %
Monocytes Absolute: 1 10*3/uL (ref 0.1–1.0)
Monocytes Relative: 14 %
Myelocytes: 0 %
NRBC: 0 % (ref 0.0–0.2)
Neutro Abs: 3.1 10*3/uL (ref 1.7–7.7)
Neutrophils Relative %: 46 %
Other: 0 %
Platelets: 172 10*3/uL (ref 150–400)
Promyelocytes Relative: 0 %
RBC: 3.45 MIL/uL — ABNORMAL LOW (ref 3.87–5.11)
RDW: 14.4 % (ref 11.5–15.5)
WBC: 6.8 10*3/uL (ref 4.0–10.5)
nRBC: 0 /100 WBC

## 2019-01-05 MED ORDER — FUROSEMIDE 10 MG/ML IJ SOLN
40.0000 mg | Freq: Once | INTRAMUSCULAR | Status: AC
  Administered 2019-01-05: 40 mg via INTRAVENOUS
  Filled 2019-01-05: qty 4

## 2019-01-05 NOTE — Progress Notes (Signed)
   Rate remains in the 90s-100s, Afib on telemetry. Would continue Dilt and Eliquis. Needs to recover from current infection and acute HF. Would plan to see back in the office after she has been on [facility] for at least weeks to consider DCCV at that time. Agree with diuresis. No new recs at this time.   SignedReino Bellis, NP-C 01/05/2019, 9:35 AM Pager: [phone]

## 2019-01-05 NOTE — Discharge Instructions (Addendum)
Antibiotic Medicine, Adult ° °Antibiotic medicines treat infections caused by a type of germ called bacteria. They work by killing the bacteria that make you sick. °When do I need to take antibiotics? °You often need these medicines to treat bacterial infections, such as: °· A urinary tract infection (UTI). °· Strep throat. °· Meningitis. This affects the spinal cord and brain. °· A bad lung infection. °You may start the medicines while your doctor waits for tests to come back. When the tests come back, your doctor may change or stop your medicine. °When are antibiotics not needed? °You do not need these medicines for most common illnesses, such as: °· A cold. °· The flu. °· A sore throat. °Antibiotics are not always needed for all infections caused by bacteria. Do not ask for these medicines, or take them, when they are not needed. °What are the risks of taking antibiotics? °Most antibiotics can cause an infection called Clostridioides difficile (C. diff).This causes watery poop (diarrhea). Let your doctor know right away if: °· You have watery poop while taking an antibiotic. °· You have watery poop after you stop taking an antibiotic. The illness can happen weeks after you stop the medicine. °You also have a risk of getting an infection in the future that antibiotics cannot treat (antibiotic-resistant infection). This type of infection can be dangerous. °What else should I know about taking antibiotics? ° °· You need to take the entire prescription. °? Take the medicine for as long as told by your doctor. °? Do not stop taking it even if you start to feel better. °· Try not to miss any doses. If you miss a dose, call your doctor. °· Birth control pills may not work. If you take birth control pills: °? Keep on taking them. °? Use a second form of birth control, such as a condom. Do this for as long as told by your doctor. °· Ask your doctor: °? How long to wait in between doses. °? If you should take the medicine  with food. °? If there is anything you should stay away from while taking the antibiotic, such as: °? Food. °? Drinks. °? Medicines. °? If there are any side effects you should watch for. °· Only take the medicines that your doctor told you to take. Do not take medicines that were given to someone else. °· Drink a large glass of water with the medicine. °· Ask the pharmacist for a tool to measure the medicine, such as: °? A syringe. °? A cup. °? A spoon. °· Throw away any extra medicine. °Contact a doctor if: °· You get worse. °· You have new joint pain or muscle aches after starting the medicine. °· You have side effects from the medicine, such as: °? Stomach pain. °? Watery poop. °? Feeling sick to your stomach (nausea). °Get help right away if: °· You have signs of a very bad allergic reaction. If this happens, stop taking the medicine right away. Signs may include: °? Hives. These are raised, itchy, red bumps on the skin. °? Skin rash. °? Trouble breathing. °? Wheezing. °? Swelling. °? Feeling dizzy. °? Throwing up (vomiting). °· Your pee (urine) is dark, or is the color of blood. °· Your skin turns yellow. °· You bruise easily. °· You bleed easily. °· You have very bad watery poop and cramps in your belly. °· You have a very bad headache. °Summary °· Antibiotics are often used to treat infections caused by bacteria. °· Only take   these medicines when needed.  Let your doctor know if you have watery poop while taking an antibiotic.  You need to take the entire prescription. This information is not intended to replace advice given to you by your health care provider. Make sure you discuss any questions you have with your health care provider. Document Released: 08/12/2008 Document Revised: 05/04/2018 Document Reviewed: 11/05/2016 Elsevier Interactive Patient Education  2019 [place] on my medicine - ELIQUIS (apixaban)  Why was Eliquis prescribed for you? Eliquis was prescribed  for you to reduce the risk of a blood clot forming that can cause a stroke if you have a medical condition called atrial fibrillation (a type of irregular heartbeat).  What do You need to know about Eliquis ? Take your Eliquis TWICE DAILY - one tablet in the morning and one tablet in the evening with or without food. If you have difficulty swallowing the tablet whole please discuss with your pharmacist how to take the medication safely.  Take Eliquis exactly as prescribed by your doctor and DO NOT stop taking Eliquis without talking to the doctor who prescribed the medication.  Stopping may increase your risk of developing a stroke.  Refill your prescription before you run out.  After discharge, you should have regular check-up appointments with your healthcare provider that is prescribing your Eliquis.  In the future your dose may need to be changed if your kidney function or weight changes by a significant amount or as you get older.  What do you do if you miss a dose? If you miss a dose, take it as soon as you remember on the same day and resume taking twice daily.  Do not take more than one dose of ELIQUIS at the same time to make up a missed dose.  Important Safety Information A possible side effect of Eliquis is bleeding. You should call your healthcare provider right away if you experience any of the following: ? Bleeding from an injury or your nose that does not stop. ? Unusual colored urine (red or dark brown) or unusual colored stools (red or black). ? Unusual bruising for unknown reasons. ? A serious fall or if you hit your head (even if there is no bleeding).  Some medicines may interact with Eliquis and might increase your risk of bleeding or clotting while on Eliquis. To help avoid this, consult your healthcare provider or pharmacist prior to using any new prescription or non-prescription medications, including herbals, vitamins, non-steroidal anti-inflammatory drugs (NSAIDs)  and supplements.  This website has more information on Eliquis (apixaban): http://www.eliquis.com/eliquis/home  Campylobacter Gastroenteritis Campylobacter gastroenteritis is a common infection that can cause diarrhea and other symptoms. It is caused by Campylobacter bacteria. These bacteria often infect animals, and the condition spreads easily to other animals and humans through food and water that contain the bacteria (contaminated food and water). Campylobacter gastroenteritis is also known as traveler's diarrhea, and it is more common in countries where food and water are contaminated. In very rare cases, a campylobacter infection may lead to another condition called Guillain-Barr syndrome (GBS). This condition can occur when the body's disease-fighting (immune) system overreacts after an infection. Signs of GBS include weakness or inability to move (paralysis) that may last for weeks or years. What are the causes? This condition is caused by Campylobacter bacteria. You may get this infection if you:  Drink water that is contaminated by stool (feces) of an infected animal or person.  Eat raw or undercooked meat  from an infected animal.  Eat raw or undercooked fruits or vegetables that have come in contact with the feces or meat of an infected animal.  Drink milk that has not been heated enough to kill bacteria (has not been pasteurized).  Touch anything that is contaminated, then touch your mouth before you have washed your hands. What increases the risk? People at higher risk of infection include:  Young children.  Elderly people.  People with a weak immune system, such as people who have AIDS (acquired immunodeficiency syndrome), cancer, or other long-term (chronic) diseases.  Pregnant women. What are the signs or symptoms? Symptoms of this condition include:  Watery diarrhea, which may be bloody.  Pain in the abdomen.  Cramps.  Fever.  Nausea and vomiting.  Muscle  aches or headache. Symptoms usually start 2-5 days after infection occurs. Symptoms may be more severe in people who have a weak immune system. Healthy people may have the infection without showing any symptoms. How is this diagnosed? This condition may be diagnosed based on:  Your symptoms.  Recent history of travel to a place where water contamination is common.  A physical exam.  Testing a sample of stool or body fluid to check for bacteria. How is this treated? In most cases, this condition goes away without treatment within 7 days. Your health care provider may:  Recommend an ORS (oral rehydration solution). This is a drink that helps you replace fluids and electrolytes (rehydrate). It is found at pharmacies and retail stores.  Prescribe medicines to relieve nausea, vomiting, or diarrhea.  Prescribe antibiotic medicine, if you have a weak immune system or severe symptoms.  Give you IV fluids at the hospital to prevent dehydration, if you cannot drink enough to replace fluids that you lost because of severe vomiting or diarrhea. Follow these instructions at home:    Take over-the-counter and prescription medicines only as told by your health care provider.  Drink enough fluid to keep your urine pale yellow. This is especially important if you are vomiting or if you have diarrhea.  Drink clear fluids as you are able. Clear fluids include water, ice chips, diluted fruit juice, and low-calorie sports drinks.  Eat small meals throughout the day that include healthy foods such as whole grains, lean meats, and fruits and vegetables.  Avoid fluids that contain a lot of sugar or caffeine, such as energy drinks, soda, and some sports drinks.  Avoid alcohol.  If you were prescribed an antibiotic, take it as told by your health care provider. Do not stop taking the antibiotic even if you start to feel better.  Rest at home until your symptoms go away. Return to your normal  activities as told by your health care provider.  Wash your hands often with soap and hot water. If soap and water are not available, use hand sanitizer.  Keep all follow-up visits as told by your health care provider. This is important. How is this prevented?  Use a meat thermometer to make sure you cook meat to the recommended temperature.  Always wash fruits and vegetables before eating or preparing them.  Do not drink unpasteurized (raw) milk.  Do not let food come in contact with raw meat or any juice from raw meat.  After you prepare raw meat, wash your hands, countertops, cutting boards, and all utensils with hot water and soap. If you use a cloth dishtowel, do not use it again until you have washed it.  Wash your hands  with hot water and soap after going to the bathroom, changing a diaper, or coming in contact with pet feces.  When traveling in a country where contamination is common: ? Do not eat any uncooked foods. ? Drink only bottled water. Contact a health care provider if:  Your symptoms last more than 7 days.  Your symptoms get worse.  You have a fever. Get help right away if:  You cannot drink fluids without vomiting.  You have difficulty breathing.  You have symptoms of dehydration, such as: ? Dry skin. ? Thirst. ? Dark urine. ? Decreased urination. ? Tiredness. ? Confusion.  You develop weakness or paralysis. Summary  Campylobacter gastroenteritis is a common infection that can cause diarrhea and other symptoms. It is caused by bacteria.  This condition spreads easily to other animals and humans through food and water that contain the bacteria (contaminated food and water).  In most cases, this condition goes away without treatment within 7 days. This information is not intended to replace advice given to you by your health care provider. Make sure you discuss any questions you have with your health care provider. Document Released: 06/29/2017  Document Revised: 06/29/2017 Document Reviewed: 06/29/2017 Elsevier Interactive Patient Education  [employer].

## 2019-01-05 NOTE — Progress Notes (Addendum)
Initial Nutrition Assessment  DOCUMENTATION CODES:   Not applicable  INTERVENTION:  Encouraged patient to choose foods she likes from hospital menu to increase PO intake. Took patient food preferences and entered into [facility]. Family will continue to bring foods from home.   NUTRITION DIAGNOSIS:   Inadequate oral intake related to decreased appetite as evidenced by per patient/family report.  GOAL:   Patient will meet greater than or equal to 90% of their needs  MONITOR:   PO intake, Labs  REASON FOR ASSESSMENT:   Consult Assessment of nutrition requirement/status  ASSESSMENT:   85 year old female with PMH of HTN, DM, CHF, and A fib. Presented to ED with complaints of fever, nausea and fatigue. Pt has AKI on CKD stage 3. Admitted with campylobacter enteritis.   Was requiring 2L O2 at night when sleeping, last night O2 needs increased to 4L.   Spoke with patient and her daughter; patient was in bedside chair. Daughter helped with the conversation.  Patient reported that she normally has 2 meals a day with snacks in between. She really enjoys snacking on fruits throughout the day. She cooks all the meals which are always authentic, arabic foods. Breakfast is often cheese with olives and a drizzle of olive oil. Dinner will be baked chicken with vegetables, stuffed cabbage, or spinach pie. She does not often drink sodas; observed unopened ginger ale and diet coke on her table. Pt endorses a decrease in appetite d/t diarrhea. No recent episodes of n/v.   Per chart, meal completion is no higher than 25% but patient's daughter reports bringing her some food from home.   Patient has no problems ambulating; she does laundry, and cleaning without difficulties. Her UBW is around 170# and reports no changes in wt recently. NFPE revealed no depletions.   Took patients food preferences. Encouraged her to choose foods she likes and if needed continue to bring foods from home.    Medications reviewed and include: Protonix Labs reviewed: creatinine 1.33 (H), alkaline phos 22 (L)  NUTRITION - FOCUSED PHYSICAL EXAM:    Most Recent Value  Orbital Region  No depletion  Upper Arm Region  No depletion  Thoracic and Lumbar Region  No depletion  Buccal Region  No depletion  Temple Region  No depletion  Clavicle Bone Region  Mild depletion  Clavicle and Acromion Bone Region  Mild depletion  Scapular Bone Region  No depletion  Dorsal Hand  No depletion  Patellar Region  No depletion  Anterior Thigh Region  No depletion  Posterior Calf Region  No depletion  Edema (RD Assessment)  Mild  Hair  Reviewed  Eyes  Reviewed  Mouth  Reviewed  Skin  Reviewed  Nails  Reviewed       Diet Order:   Diet Order            Diet heart healthy/carb modified Room service appropriate? Yes; Fluid consistency: Thin  Diet effective now              EDUCATION NEEDS:   No education needs have been identified at this time  Skin:  Skin Assessment: Reviewed RN Assessment  Last BM:  2/19  Height:   Ht Readings from Last 1 Encounters:  01/01/19 5\' 6"  (1.676 m)    Weight:   Wt Readings from Last 1 Encounters:  01/05/19 80.3 kg    Ideal Body Weight:  59.09 kg  BMI:  Body mass index is 28.57 kg/m.  Estimated Nutritional Needs:  Kcal:  1550-1750  Protein:  75-85g   Fluid:  >/= 1.5L    [employer] Dietetic Intern

## 2019-01-05 NOTE — Progress Notes (Signed)
MD on call notified of pt's increase in O2 demands. Pt's O2 sats have dropped as low as 76%. Pt was on 2L of O2 & oxygen was increaed to 4L to maintain sats 90-95%. Pt's pulse ox was changed out to make sure that the reading wasn't false. Pt is easy to arouse. Per pt she feels fine just tired. MD came up to bedside. Bladder scan done & showed 25ml. New orders for 40 mg of IV lasix. Per MD call if any changes in status such as continued decrease in O2 sats or fever. Pt educated on new meds & the changes in her status. Will continue to monitor the pt. Hoover Brunette, RN

## 2019-01-05 NOTE — Progress Notes (Signed)
Family Medicine Teaching Service Daily Progress Note Intern Pager: [phone]  Patient name: Anne Roach Medical record number: [id number] Date of birth: [birth year] Age: 85 y.o. Gender: female  Primary Care Provider: Lucianne Lei, MD Consultants: Cards Code Status: Full   Pt Overview and Major Events to Date:  Admitted 2/15  Assessment and Plan:  Anne Roach is a 85 y.o. female admitted for dehydration, fever, chills, 2/2 acute colitis. Her chronic conditions include Afib just started on eliquis on 12/30/18, OSA,. HTN, DM.   Acute hypoxemic respiratory failure secondary to sepsis and pulmonary edema, improved Patient new O2 requirement on admission. Initially secondary to sepsis, although no pulmonary infection involvement. Now likely due to volume overload and third spacing secondary to fluids administured in the hospital. Patient given IV lasix 20 mg yesterday with poor response. Overnight, her O2 requirement climbed from 2L to 4L. Patient given IV lasix 40 mgx1, and had good response with UOP of 1.7L output. Respiratory requrement has dropped from 4L to 2L with sats to 98+%. Weight decreased from 181 to 177.  - IV lasix 40 mg x1 - repeat CXR - wean O2 as possible, maintain O2 sat >92% - strict I/Os   Campylobacter enteritis: improved VSS. Afebrile. Only 1 BM charted past 24 hrs. Elevated calprotecting,  - Continue azithromycin 500 mg daily (2/17-2/19)  - Strict I's and O's - GI cocktail prn  -Tylenol scheduled - PT-HHPT - OOB with meals  Paroxsymal Afib w/ RVR likely secondary to acute viral illness: rate controlled BP remaining stable.  Echo with EF 60-65%. Cr stable at 1.3 in setting of lasix. Will monitor closely for correct dosing of Elequis.  - Cards consulted, will follow up outpatient in 3 weeks -Continue anticoagulation with Eliquis, now on 2.5 twice daily per pharmacy -Monitor BMP, electrolytes  UTI:  > 100,000 E. coli noted in urine culture. - Currently on  Keflex (2/17-2/24) - Continue to monitor symptoms  CKD III: stable Cr at BL. Cr today 1.3  -Continue to monitor BMP -Encourage oral rehydration  Chronic issues: # DM:  HgA1C 6.4 on 01/02/19.  Takes metformin 750 mg at home. - monitor daily CBGs  # HTN:  Normotensive.  - cont diltiazem PO - monitor BP in setting of giving lasix for fluid overload - Continue to hold home amlodipine and irbesartan-HCTZ   # Family Hx Alzheimers Patient without symptoms.  -Continue home Namenda 10 mg (started in 2015)  #HIsotry Lung nodule on CT  No  Evidence of follow up chest CT  -Out patient follow up w/ new PCP, Dr. Norris Cross   #History of Pressure injury 03/21/17 Stage 1 -Wound care  #FEN/GI:   Zofran for nausea  Nutrition consult  Nutrition:  Heart Healthy, monitor PO   Access: Left and RT PIV VTE prophylaxis: Eliquis  Disposition: Continued inpatient care  Subjective:  O/n,pt has dyspnic and O2 destaruation into 70%. Required increased O2 for 2LNC to 4LNC. O2 sats then improved to the 90s. Pt given IV lasix 40 mg. Good resposnse with 1.7L out. Pt is more alert this AM. Family is at bedside. Family member was asking if it was OK to dumb the urinal. I advised against this as it messed up our I/Os counts.   Objective: Temp:  [97.8 F (36.6 C)-98.9 F (37.2 C)] 97.8 F (36.6 C) (02/19 0630) Pulse Rate:  [66-80] 79 (02/19 0630) Resp:  [17-18] 18 (02/19 0630) BP: (103-123)/(55-89) 103/55 (02/19 0630) SpO2:  [98 %-100 %] 98 % (  02/19 0630) Weight:  [80.3 kg] 80.3 kg (02/19 0630) Physical Exam: General: Alert, appears tired, in no acute distress HEENT: NCAT, MMM  Cardiac: Irregularly irregular rate and rhythm, no murmurs noted Lungs: basilar crackles bilaterally, no increased WOB, 2L [place] inplace Abdomen: soft, nondistended, normoactive bowel sounds, mildly tender throughout without any rebounding or guarding Msk: Moves all extremities spontaneously Ext: Warm, dry, 2+  distal pulses, trace edema in lower extremities bilaterally Skin: dry, not diaphoretic  Laboratory: Recent Labs  Lab 01/03/19 0644 01/04/19 0407 01/05/19 0339  WBC 4.9 5.9 6.8  HGB 9.9* 9.7* 10.2*  HCT 31.3* 30.7* 31.7*  PLT 126* 149* 172   Recent Labs  Lab 01/01/19 1000  01/03/19 0644 01/04/19 0407 01/05/19 0339  NA 135   < > 141 140 140  K 3.5   < > 4.4 5.0 3.7  CL 102   < > 115* 115* 115*  CO2 23   < > 18* 19* 19*  BUN 13   < > 15 12 11   CREATININE 1.48*   < > 1.52* 1.32* 1.33*  CALCIUM 8.4*   < > 7.6* 7.9* 7.9*  PROT 6.4*  --   --   --  5.1*  BILITOT 0.8  --   --   --  QUANTITY NOT SUFFICIENT, UNABLE TO PERFORM TEST  ALKPHOS 28*  --   --   --  22*  ALT 14  --   --   --  QUANTITY NOT SUFFICIENT, UNABLE TO PERFORM TEST  AST 22  --   --   --  16  GLUCOSE 133*   < > 141* 127* 105*   < > = values in this interval not displayed.    Imaging/Diagnostic Tests: Dg Chest Port 1 View  Result Date: 01/04/2019 CLINICAL DATA:  85 year old female with a history of desaturation EXAM: PORTABLE CHEST 1 VIEW COMPARISON:  01/01/2019 FINDINGS: Cardiomediastinal silhouette unchanged with persisting cardiomegaly. Fullness in the central vasculature with interlobular septal thickening. New opacity at the right lung base partially obscuring the right heart border and the right hemidiaphragm. Obscuration of the left hemidiaphragm with blunting of the bilateral costophrenic angles. No pneumothorax. IMPRESSION: Evidence of acute congestive heart failure with bilateral pleural effusions and associated atelectasis/consolidation. Electronically Signed   By: Corrie Mckusick D.O.   On: 01/04/2019 12:10    Bonnita Hollow, MD 01/05/2019, 8:05 AM PGY-2, [place] Intern pager: [phone], text pages welcome

## 2019-01-06 ENCOUNTER — Inpatient Hospital Stay (HOSPITAL_COMMUNITY): Payer: Medicare HMO

## 2019-01-06 LAB — CULTURE, BLOOD (ROUTINE X 2)
CULTURE: NO GROWTH
Culture: NO GROWTH
SPECIAL REQUESTS: ADEQUATE
Special Requests: ADEQUATE

## 2019-01-06 LAB — CBC
HCT: 32.9 % — ABNORMAL LOW (ref 36.0–46.0)
Hemoglobin: 10.3 g/dL — ABNORMAL LOW (ref 12.0–15.0)
MCH: 28.5 pg (ref 26.0–34.0)
MCHC: 31.3 g/dL (ref 30.0–36.0)
MCV: 91.1 fL (ref 80.0–100.0)
NRBC: 0 % (ref 0.0–0.2)
Platelets: 214 10*3/uL (ref 150–400)
RBC: 3.61 MIL/uL — ABNORMAL LOW (ref 3.87–5.11)
RDW: 14 % (ref 11.5–15.5)
WBC: 7 10*3/uL (ref 4.0–10.5)

## 2019-01-06 LAB — BASIC METABOLIC PANEL
ANION GAP: 10 (ref 5–15)
BUN: 10 mg/dL (ref 8–23)
CALCIUM: 7.9 mg/dL — AB (ref 8.9–10.3)
CO2: 20 mmol/L — ABNORMAL LOW (ref 22–32)
Chloride: 109 mmol/L (ref 98–111)
Creatinine, Ser: 1.33 mg/dL — ABNORMAL HIGH (ref 0.44–1.00)
GFR calc Af Amer: 43 mL/min — ABNORMAL LOW (ref >60)
GFR calc non Af Amer: 37 mL/min — ABNORMAL LOW (ref >60)
Glucose, Bld: 100 mg/dL — ABNORMAL HIGH (ref 70–99)
Potassium: 3.5 mmol/L (ref 3.5–5.1)
Sodium: 139 mmol/L (ref 135–145)

## 2019-01-06 MED ORDER — ALUM & MAG HYDROXIDE-SIMETH 200-200-20 MG/5ML PO SUSP
15.0000 mL | ORAL | Status: DC | PRN
  Administered 2019-01-06: 15 mL via ORAL
  Filled 2019-01-06: qty 30

## 2019-01-06 NOTE — Progress Notes (Signed)
Pt ambulated in hall. Resting oxygen sat. 98% on room air. While ambulating oxygen sat. 95% on room air. Pt complained of dizziness while ambulating but no shortness of breath or chest pain. BP 128/78, HR 105.

## 2019-01-06 NOTE — Care Management Note (Addendum)
Case Management Note  Patient Details  Name: JAANA BRODT MRN: [id number] Date of Birth: [birth year]  Subjective/Objective:  Pt presented for dehydration, fever, chills. PTA from home with daughter and nephew will be staying with patient post hospitalization. PT recommendations for [facility] PT.                  Action/Plan: CM did try to speak with patient regarding [place]- patient sleepy. States please call daughter. CM did reach out to daughter. CM will provide a Medicare.Gov List and place in room for choice. Patient has RW and BSC. No further needs at this time.    Expected Discharge Date:                  Expected Discharge Plan:  [place]  In-House Referral:  NA  Discharge planning Services  CM Consult  Post Acute Care Choice:  Home Health Choice offered to:   Patient; Adult Children  DME Arranged:   N/A DME Agency:   N/A  HH Arranged:   PT HH Agency:   [place]   Status of Service:  Completd discussed at Long Length of Stay Meetings, dates discussed:    Additional Comments: 1211 01-07-19 Jacqlyn Krauss, RN,BSN  Case Manager [phone] Daughter arrived to choose agency of choice for [facility]. Daughter chose [facility]- awaiting call back from [place] to make sure they can accept the patient-[facility] accepted the patient. No further needs from CM at this time.  01/06/2019, 12:40 PM

## 2019-01-06 NOTE — Progress Notes (Signed)
Physical Therapy Treatment Patient Details Name: Anne Roach MRN: [id number] DOB: 1933/07/19 Today's Date: 01/06/2019    History of Present Illness Pt is an 85 y.o. female admitted 01/01/19 with dehydration, fever, chills secondary to acute colitis. Worked up for acute hypoxemic respiratory failure secondary to sepsis and pulmonary edema. PMH includes afib (started on Eliquis 12/30/18), OSA, HTN, DM, CKD III.   PT Comments    Pt remains limited by fatigue. Able to transfer with less assist this session. Declined ambulation secondary to fatigue having ambulated in hallway with RN for saturations qualifications. VSS on RA. Pt would benefit from RW for home use to decrease fall risk. Educ on BLE therex and importance of mobility.   Follow Up Recommendations  Home health PT;Supervision for mobility/OOB     Equipment Recommendations  Rolling walker with 5" wheels    Recommendations for Other Services       Precautions / Restrictions Precautions Precautions: Fall Restrictions Weight Bearing Restrictions: No    Mobility  Bed Mobility Overal bed mobility: Needs Assistance Bed Mobility: Supine to Sit     Supine to sit: Supervision;HOB elevated     General bed mobility comments: Increased time and effort, use of bed rail  Transfers Overall transfer level: Needs assistance Equipment used: 1 person hand held assist Transfers: Sit to/from Stand Sit to Stand: Min assist            Ambulation/Gait Ambulation/Gait assistance: Min assist Gait Distance (Feet): 3 Feet Assistive device: None Gait Pattern/deviations: Step-to pattern;Shuffle Gait velocity: Decreased   General Gait Details: Amb from bed to recliner without DME, minA to correct 1x LOB. Pt declining further distance having ambulated with RN for saturations qualifications   Stairs             Wheelchair Mobility    Modified Rankin (Stroke Patients Only)       Balance Overall balance assessment:  Needs assistance   Sitting balance-Leahy Scale: Fair       Standing balance-Leahy Scale: Fair Standing balance comment: Unable to accept challenge without losing balance                            Cognition Arousal/Alertness: Awake/alert Behavior During Therapy: Flat affect Overall Cognitive Status: No family/caregiver present to determine baseline cognitive functioning Area of Impairment: Problem solving;Following commands                       Following Commands: Follows one step commands with increased time     Problem Solving: Slow processing;Decreased initiation;Requires verbal cues;Requires tactile cues General Comments: Slow moving. Unsure if due to true slowed processing or fatigue      Exercises General Exercises - Lower Extremity Long Arc Quad: AROM;Both;Seated Hip Flexion/Marching: AROM;Both;Seated    General Comments General comments (skin integrity, edema, etc.): VSS      Pertinent Vitals/Pain Pain Assessment: No/denies pain    Home Living                      Prior Function            PT Goals (current goals can now be found in the care plan section) Acute Rehab PT Goals Patient Stated Goal: reports she needs more help during the day PT Goal Formulation: With patient Time For Goal Achievement: 01/11/19 Potential to Achieve Goals: Fair Progress towards PT goals: Progressing toward goals    Frequency  Min 3X/week      PT Plan Current plan remains appropriate    Co-evaluation              AM-PAC PT "6 Clicks" Mobility   Outcome Measure  Help needed turning from your back to your side while in a flat bed without using bedrails?: A Little Help needed moving from lying on your back to sitting on the side of a flat bed without using bedrails?: A Little Help needed moving to and from a bed to a chair (including a wheelchair)?: A Little Help needed standing up from a chair using your arms (e.g., wheelchair  or bedside chair)?: A Little Help needed to walk in hospital room?: A Lot Help needed climbing 3-5 steps with a railing? : A Lot 6 Click Score: 16    End of Session Equipment Utilized During Treatment: Gait belt Activity Tolerance: Patient limited by fatigue Patient left: in chair;with call bell/phone within reach;with chair alarm set Nurse Communication: Mobility status PT Visit Diagnosis: Other abnormalities of gait and mobility (R26.89);Muscle weakness (generalized) (M62.81)     Time: 6579-0383 PT Time Calculation (min) (ACUTE ONLY): 11 min  Charges:  $Self Care/Home Management: [place], PT, DPT Acute Rehabilitation Services  Pager [phone] Office [phone]  Derry Lory 01/06/2019, 12:46 PM

## 2019-01-06 NOTE — Progress Notes (Signed)
Spoke to patient's daughter regarding discharge timing. Patient would not have 24 hr supervision today, but would tomorrow. Family would prefer tomorrow discharge.

## 2019-01-06 NOTE — Progress Notes (Addendum)
Family Medicine Teaching Service Daily Progress Note Intern Pager: [phone]  Patient name: Anne Roach Medical record number: [id number] Date of birth: [birth year] Age: 85 y.o. Gender: female  Primary Care Provider: Lucianne Lei, MD Consultants: Cards Code Status: Full   Pt Overview and Major Events to Date:  Admitted 2/15  Antibiotic course:  azithromycin 500 mg daily (2/17-2/19)  Keflex - Currently on Keflex (2/17-2/23)  Assessment and Plan:  Anne Roach is a 85 y.o. female admitted for dehydration, fever, chills, 2/2 acute colitis. Her chronic conditions include Afib just started on eliquis on 12/30/18, OSA,. HTN, DM.   Acute hypoxemic respiratory failure secondary to sepsis and pulmonary edema, improved Able to wean pt to RA this AM. Maintained O2 sat > 95% even while ambulating although, she is very frail. Patient repeat CXR this AM is improved over yesterday s/p lasix with good UOP of 2L. Wt continues to decrease from 177lbs to 176lbs. Patient also had improved PO with 800 mL in yesterday.  - montior O2 sats with spot checks, discontinue continuous pulse oxymetry - OOB for meals - ambulate qShift - PT recs  Campylobacter enteritis: resolved VSS. Afebrile. Only 1 BM charted past 24 hrs. Patient has completed azithromycin course.  - encourage PO - OOB for meals  - Strict I's and O's - GI cocktail prn  -Tylenol scheduled - PT-HHPT - OOB with meals  Paroxsymal Afib w/ RVR likely secondary to acute viral illness: rate controlled BP remaining stable.  Echo with EF 60-65%. Cr stable at 1.3. Cards recommendations , will follow up outpatient in 3 weeks - Continue anticoagulation with Eliquis, now on 5 twice daily per pharmacy - Monitor BMP, electrolytes  UTI:  > 100,000 E. coli noted in urine culture. No endorses dysuria. Good UOP.  - continue Keflex - Continue to monitor symptoms  CKD III: stable Cr at BL. Cr today 1.3  -Continue to monitor BMP -Encourage oral  rehydration  Chronic issues: # DM:  HgA1C 6.4 on 01/02/19.  Takes metformin 750 mg at home. - hold metformin while inpatient - monitor daily CBGs  # HTN:  Borderline HTN BP 116/91.  - cont diltiazem PO - monitor BP  - Continue to hold home amlodipine and irbesartan-HCTZ   # Family Hx Alzheimers Patient without symptoms.  -Continue home Namenda 10 mg (started in 2015)  #FEN/GI:   Zofran for nausea  Nutrition consult  Nutrition:  Heart Healthy, monitor PO   Access: Left and RT PIV VTE prophylaxis: Eliquis  Disposition: likely home tomorrow, pending continued improvement  Subjective:  No acute events overnight. Up eating breakfast. Smiling. Denies any difficulty breathing or abdominal pain.   Objective: Temp:  [98.1 F (36.7 C)-98.9 F (37.2 C)] 98.1 F (36.7 C) (02/20 0541) Pulse Rate:  [78-109] 97 (02/20 0541) Resp:  [18-19] 18 (02/20 0541) BP: (101-116)/(53-91) 116/91 (02/20 0541) SpO2:  [98 %-100 %] 100 % (02/20 0541) Weight:  [80 kg] 80 kg (02/20 0541) Physical Exam: General: Alert, appears tired, in no acute distress HEENT: NCAT, MMM  Cardiac: Irregularly irregular rate and rhythm, no murmurs noted Lungs: basilar crackles bilaterally, no increased WOB, Abdomen: soft, nondistended, normoactive bowel sounds, nontender Msk: Moves all extremities spontaneously, Ext: Warm, dry, 2+ distal pulses, trace edema in upper extremities bilaterally Skin: dry, not diaphoretic  Laboratory: Recent Labs  Lab 01/04/19 0407 01/05/19 0339 01/06/19 0342  WBC 5.9 6.8 7.0  HGB 9.7* 10.2* 10.3*  HCT 30.7* 31.7* 32.9*  PLT 149* 172 214  Recent Labs  Lab 01/01/19 1000  01/04/19 0407 01/05/19 0339 01/06/19 0342  NA 135   < > 140 140 139  K 3.5   < > 5.0 3.7 3.5  CL 102   < > 115* 115* 109  CO2 23   < > 19* 19* 20*  BUN 13   < > 12 11 10   CREATININE 1.48*   < > 1.32* 1.33* 1.33*  CALCIUM 8.4*   < > 7.9* 7.9* 7.9*  PROT 6.4*  --   --  5.1*  --   BILITOT 0.8  --    --  QUANTITY NOT SUFFICIENT, UNABLE TO PERFORM TEST  --   ALKPHOS 28*  --   --  22*  --   ALT 14  --   --  QUANTITY NOT SUFFICIENT, UNABLE TO PERFORM TEST  --   AST 22  --   --  16  --   GLUCOSE 133*   < > 127* 105* 100*   < > = values in this interval not displayed.    Imaging/Diagnostic Tests: Dg Chest Port 1 View  Result Date: 01/05/2019 CLINICAL DATA:  Dyspnea and respiratory abnormalities EXAM: PORTABLE CHEST 1 VIEW COMPARISON:  Yesterday FINDINGS: Chronic cardiomegaly. Haziness of the lower chest attributed to atelectasis and pleural fluid. No Kerley lines or pneumothorax. IMPRESSION: Stable cardiomegaly, pleural effusions, and presumed atelectasis. Electronically Signed   By: Monte Fantasia M.D.   On: 01/05/2019 10:58    Bonnita Hollow, MD 01/06/2019, 8:23 AM PGY-2, [place] Intern pager: [phone], text pages welcome

## 2019-01-06 NOTE — Progress Notes (Signed)
Offered to walk the patient in hall. Pt declined and stated she was too tired and didn't feel like walking. RN encouraged her to walk and educated on the importance. BP 108/55, HR 70. Will continue to monitor and offer to walk pt again.

## 2019-01-07 ENCOUNTER — Other Ambulatory Visit (HOSPITAL_COMMUNITY): Payer: Self-pay | Admitting: Family Medicine

## 2019-01-07 LAB — BASIC METABOLIC PANEL
Anion gap: 8 (ref 5–15)
BUN: 9 mg/dL (ref 8–23)
CHLORIDE: 110 mmol/L (ref 98–111)
CO2: 23 mmol/L (ref 22–32)
Calcium: 8.2 mg/dL — ABNORMAL LOW (ref 8.9–10.3)
Creatinine, Ser: 1.22 mg/dL — ABNORMAL HIGH (ref 0.44–1.00)
GFR calc non Af Amer: 41 mL/min — ABNORMAL LOW (ref >60)
GFR, EST AFRICAN AMERICAN: 47 mL/min — AB (ref >60)
GLUCOSE: 110 mg/dL — AB (ref 70–99)
Potassium: 3.6 mmol/L (ref 3.5–5.1)
Sodium: 141 mmol/L (ref 135–145)

## 2019-01-07 LAB — CBC
HEMATOCRIT: 33.6 % — AB (ref 36.0–46.0)
Hemoglobin: 10.7 g/dL — ABNORMAL LOW (ref 12.0–15.0)
MCH: 28.8 pg (ref 26.0–34.0)
MCHC: 31.8 g/dL (ref 30.0–36.0)
MCV: 90.6 fL (ref 80.0–100.0)
Platelets: 254 10*3/uL (ref 150–400)
RBC: 3.71 MIL/uL — ABNORMAL LOW (ref 3.87–5.11)
RDW: 14 % (ref 11.5–15.5)
WBC: 9.3 10*3/uL (ref 4.0–10.5)
nRBC: 0 % (ref 0.0–0.2)

## 2019-01-07 MED ORDER — CEPHALEXIN 500 MG PO CAPS: 500.0000 mg | ORAL_CAPSULE | Freq: Two times a day (BID) | ORAL | 0 refills | Status: AC

## 2019-01-07 MED ORDER — DILTIAZEM HCL ER COATED BEADS 240 MG PO CP24: 240.0000 mg | ORAL_CAPSULE | Freq: Every day | ORAL | 1 refills | Status: DC

## 2019-01-07 NOTE — Progress Notes (Signed)
Family Medicine Teaching Service Daily Progress Note Intern Pager: [phone]  Patient name: Anne Roach Medical record number: [id number] Date of birth: 20-Nov-1932 Age: 85 y.o. Gender: female  Primary Care Provider: Lucianne Lei, MD Consultants: Cards Code Status: Full   Pt Overview and Major Events to Date:  Admitted 2/15  Antibiotic course:  azithromycin 500 mg daily (2/17-2/19)  Keflex - Currently on Keflex (2/17-2/23)  Assessment and Plan:  Anne Roach is a 85 y.o. female admitted for dehydration, fever, chills, 2/2 acute colitis. Her chronic conditions include Afib just started on eliquis on 12/30/18, OSA,. HTN, DM.   Acute hypoxemic respiratory failure secondary to sepsis and pulmonary edema, stable Overnight, patient had oxygen desaturation into 80s and was placed on  and gradually On 2/20, able to wean pt to RA and ambulate while maintained O2 sat > 95% Patient also had improved PO with 830 mL in yesterday. UOP good with 700 mL + 4 unmeasured.  - montior O2 sats with spot checks, discontinue continuous pulse oxymetry - OOB for meals - ambulate qShift - PT-HHPT  Campylobacter enteritis: resolved VSS. Afebrile. 3 BM charted past 24 hrs. Patient has completed azithromycin course. Not having abdominal pain. Improved PO.  - encourage PO - Strict I's and O's - GI cocktail prn  -Tylenol scheduled  Paroxsymal Afib w/ RVR: rate controlled BP remaining stable.  Echo with EF 60-65%.  Cards has signed off, will follow up outpatient in 3 weeks - Continue anticoagulation with Eliquis, now on 5 twice daily per pharmacy - Monitor BMP, electrolytes  UTI:  > 100,000 E. coli noted in urine culture. No endorses dysuria. Good UOP.  - continue Keflex - Continue to monitor symptoms  CKD III: stable Cr at BL. Cr today 1.2 -Continue to monitor BMP -Encourage oral rehydration  Chronic issues: # DM:  HgA1C 6.4 on 01/02/19.  Takes metformin 750 mg at home. - hold metformin  while inpatient - monitor daily CBGs  # HTN:  Normotensive.  - cont diltiazem PO - monitor BP  - Continue to hold home amlodipine and irbesartan-HCTZ   # Family Hx Alzheimers Patient without symptoms.  -Continue home Namenda 10 mg (started in 2015)  #FEN/GI:   Zofran for nausea  Nutrition consult  Nutrition:  Heart Healthy, monitor PO   Access: Left and RT PIV VTE prophylaxis: Eliquis  Disposition: likely home today  Subjective:  No acute events overnight.   Objective: Temp:  [97.7 F (36.5 C)-98.4 F (36.9 C)] 97.7 F (36.5 C) (02/21 0625) Pulse Rate:  [57-150] 90 (02/21 0625) Resp:  [19] 19 (02/21 0625) BP: (93-128)/(55-92) 120/92 (02/21 0625) SpO2:  [94 %-99 %] 99 % (02/21 0625) Weight:  [78.9 kg] 78.9 kg (02/21 0625) Physical Exam: General: Alert, appears tired, in no acute distress HEENT: NCAT, MMM  Cardiac: Irregularly irregular rate and rhythm, no murmurs noted Lungs: basilar crackles bilaterally, no increased WOB, Abdomen: soft, nondistended, normoactive bowel sounds, nontender Msk: Moves all extremities spontaneously, Ext: Warm, dry, 2+ distal pulses, trace edema in upper extremities bilaterally Skin: dry, not diaphoretic  Laboratory: Recent Labs  Lab 01/05/19 0339 01/06/19 0342 01/07/19 0521  WBC 6.8 7.0 9.3  HGB 10.2* 10.3* 10.7*  HCT 31.7* 32.9* 33.6*  PLT 172 214 254   Recent Labs  Lab 01/01/19 1000  01/05/19 0339 01/06/19 0342 01/07/19 0521  NA 135   < > 140 139 141  K 3.5   < > 3.7 3.5 3.6  CL 102   < >  115* 109 110  CO2 23   < > 19* 20* 23  BUN 13   < > 11 10 9   CREATININE 1.48*   < > 1.33* 1.33* 1.22*  CALCIUM 8.4*   < > 7.9* 7.9* 8.2*  PROT 6.4*  --  5.1*  --   --   BILITOT 0.8  --  QUANTITY NOT SUFFICIENT, UNABLE TO PERFORM TEST  --   --   ALKPHOS 28*  --  22*  --   --   ALT 14  --  QUANTITY NOT SUFFICIENT, UNABLE TO PERFORM TEST  --   --   AST 22  --  16  --   --   GLUCOSE 133*   < > 105* 100* 110*   < > = values  in this interval not displayed.    Imaging/Diagnostic Tests: No results found.  Bonnita Hollow, MD 01/07/2019, 9:00 AM PGY-2, [place] Intern pager: [phone], text pages welcome

## 2019-01-07 NOTE — Progress Notes (Signed)
[facility] will sign off.   Medication Recommendations: Continue rate control with diltiazem using titrated doses needed to keep heart rate less than 100 bpm at rest. Other recommendations (labs, testing, etc): Continue oral anticoagulation therapy indefinitely Follow up as an outpatient: Needs to have follow-up with Dr. Johnsie Cancel in 2 to 4 weeks following discharge to determine subsequent management of atrial fibrillation, i.e. continued rate control strategy versus rhythm control.

## 2019-01-07 NOTE — Progress Notes (Signed)
Physical Therapy Treatment Patient Details Name: Anne Roach MRN: [id number] DOB: 07-02-1933 Today's Date: 01/07/2019    History of Present Illness Pt is an 85 y.o. female admitted 01/01/19 with dehydration, fever, chills secondary to acute colitis. Worked up for acute hypoxemic respiratory failure secondary to sepsis and pulmonary edema. PMH includes afib (started on Eliquis 12/30/18), OSA, HTN, DM, CKD III.   PT Comments    Pt progressing well with mobility. Able to transfer, ambulate and perform ADLs at supervision-level; remains limited by fatigue. SpO2 94% on RA, HR 90-120s. Pt reports indep PTA; lives with daughter who works during day. Pt owns necessary DME although has never needed it. Continue to recommend HHPT services.   Follow Up Recommendations  Home health PT;Supervision for mobility/OOB     Equipment Recommendations  None recommended by PT    Recommendations for Other Services       Precautions / Restrictions Precautions Precautions: Fall Restrictions Weight Bearing Restrictions: No    Mobility  Bed Mobility Overal bed mobility: Modified Independent Bed Mobility: Supine to Sit           General bed mobility comments: HOB elevated  Transfers Overall transfer level: Independent Equipment used: None Transfers: Sit to/from [employer] transfer comment: Stood from bed and [facility]  Ambulation/Gait Ambulation/Gait assistance: Scientist, forensic (Feet): 200 Feet   Gait Pattern/deviations: Step-through pattern;Decreased stride length Gait velocity: Decreased Gait velocity interpretation: 1.31 - 2.62 ft/sec, indicative of limited community ambulator General Gait Details: Slow, guarded gait without DME; supervision for balance. 1x self-correct instability with turns. SpO2 94% on RA, HR 120s   Stairs             Wheelchair Mobility    Modified Rankin (Stroke Patients Only)       Balance Overall balance assessment: Needs  assistance   Sitting balance-Leahy Scale: Good Sitting balance - Comments: Indep with pericare while seated     Standing balance-Leahy Scale: Good               High level balance activites: Side stepping;Backward walking;Direction changes;Turns;Sudden stops;Head turns              Cognition Arousal/Alertness: Awake/alert Behavior During Therapy: Flat affect Overall Cognitive Status: Within Functional Limits for tasks assessed Area of Impairment: Following commands                       Following Commands: Follows one step commands with increased time       General Comments: Slow moving, fatigued. Likely baseline cognition besides this      Exercises      General Comments        Pertinent Vitals/Pain Pain Assessment: No/denies pain    Home Living Family/patient expects to be discharged to:: Private residence Living Arrangements: Children Available Help at Discharge: Family;Available PRN/intermittently Type of Home: House Home Access: Level entry   Home Layout: Two level;Able to live on main level with bedroom/bathroom Home Equipment: Gilford Rile - 2 wheels;Wheelchair - manual Additional Comments: Daughter had cancer, DME from when she needed it. Daughter works as professor at [employer] and at [employer], so gone during day    Prior Function Level of Independence: Animal nutritionist / Transfers Assistance Needed: Pt reports indep with mobility; does not use RW or wheelchair       PT Goals (current goals can now be found in the care plan section) Acute Rehab  PT Goals Patient Stated Goal: Return home PT Goal Formulation: With patient Time For Goal Achievement: 01/11/19 Potential to Achieve Goals: Good Progress towards PT goals: Progressing toward goals    Frequency    Min 3X/week      PT Plan Current plan remains appropriate    Co-evaluation              AM-PAC PT "6 Clicks" Mobility   Outcome Measure  Help needed turning from your back  to your side while in a flat bed without using bedrails?: None Help needed moving from lying on your back to sitting on the side of a flat bed without using bedrails?: None Help needed moving to and from a bed to a chair (including a wheelchair)?: None Help needed standing up from a chair using your arms (e.g., wheelchair or bedside chair)?: None Help needed to walk in hospital room?: A Little Help needed climbing 3-5 steps with a railing? : A Little 6 Click Score: 22    End of Session Equipment Utilized During Treatment: Gait belt Activity Tolerance: Patient tolerated treatment well;Patient limited by fatigue Patient left: in chair;with call bell/phone within reach Nurse Communication: Mobility status PT Visit Diagnosis: Other abnormalities of gait and mobility (R26.89);Muscle weakness (generalized) (M62.81)     Time: 4008-6761 PT Time Calculation (min) (ACUTE ONLY): 15 min  Charges:  $Gait Training: 8-22 mins                    Mabeline Caras, PT, DPT Acute Rehabilitation Services  Pager [phone] Office [place] 01/07/2019, 8:16 AM

## 2019-01-12 ENCOUNTER — Ambulatory Visit (INDEPENDENT_AMBULATORY_CARE_PROVIDER_SITE_OTHER): Payer: Medicare HMO | Admitting: Podiatry

## 2019-01-12 DIAGNOSIS — M7752 Other enthesopathy of left foot: Secondary | ICD-10-CM | POA: Diagnosis not present

## 2019-01-12 DIAGNOSIS — M109 Gout, unspecified: Secondary | ICD-10-CM

## 2019-01-12 DIAGNOSIS — M7751 Other enthesopathy of right foot: Secondary | ICD-10-CM

## 2019-01-12 MED ORDER — COLCHICINE 0.6 MG PO TABS: 0.6000 mg | ORAL_TABLET | Freq: Every day | ORAL | 0 refills | Status: DC

## 2019-01-12 MED ORDER — ALLOPURINOL 100 MG PO TABS: 100.0000 mg | ORAL_TABLET | Freq: Every day | ORAL | 2 refills | Status: DC

## 2019-01-16 NOTE — Progress Notes (Signed)
   HPI: 85 year old female with PMHx of DM presenting today with a chief complaint of a gout exacerbation of the 1st MPJ of the feet bilaterally, right greater than left, that began a few days ago, but worsened yesterday. She states she has received significant relief in the past with an injection. Walking and touching the area increases the pain. She has not done anything for treatment. Patient is here for further evaluation and treatment.   Past Medical History:  Diagnosis Date  . DM (diabetes mellitus) ([place])   . HTN (hypertension)   . Tremor      Physical Exam: General: The patient is alert and oriented x3 in no acute distress.  Dermatology: Skin is warm, dry and supple bilateral lower extremities. Negative for open lesions or macerations.  Vascular: Palpable pedal pulses bilaterally. No edema or erythema noted. Capillary refill within normal limits.  Neurological: Epicritic and protective threshold grossly intact bilaterally.   Musculoskeletal Exam: Pain on palpation to the bilateral 1st MPJs, right worse than left, with erythema and edema. Range of motion within normal limits to all pedal and ankle joints bilateral. Muscle strength 5/5 in all groups bilateral.   Assessment: 1. Acute gout / capsulitis 1st MPJ bilateral right greater than left    Plan of Care:  1. Patient evaluated. 2. Injection of 0.5 mLs Celestone Soluspan injected into the 1st MPJ of the bilateral feet.  3. Prescription for Colchicine 0.6 mg provided to patient. Take 2 tablets stat, then one daily for two weeks.  4. Prescription for Allopurinol 100 mg provided to patient.  5. Return to clinic as needed.     Edrick Kins, DPM [facility]  Dr. Edrick Kins, DPM    [address], [place] [address]                Office [phone]  Fax [phone]

## 2019-01-20 NOTE — Progress Notes (Signed)
Cardiology Office Note   Date:  01/21/2019   ID:  Anne Roach, DOB 1933/05/06, MRN [id number]  PCP:  Lucianne Lei, MD  Cardiologist:   Jenkins Rouge, MD   No chief complaint on file.     History of Present Illness: Anne Roach is a 85 y.o. female who presents for post hospital f/u afib. Seen in consult 01/01/19 Had fever,chills and dehydration found to be in afib Started on eliquis and cardizem with good rate control TTE 2/17 showed EF 60-65% only mild LAE mild to moderate MR myxomatous  Found to have campylobacter GI infection and UTI Rx with azithromycin and keflex  Feels much better now No palpitations does not notice her afib   Past Medical History:  Diagnosis Date  . DM (diabetes mellitus) ([place])   . HTN (hypertension)   . Tremor     Past Surgical History:  Procedure Laterality Date  . APPENDECTOMY    . CARPAL TUNNEL RELEASE    . CATARACT EXTRACTION Bilateral   . HERNIA REPAIR       Current Outpatient Medications  Medication Sig Dispense Refill  . allopurinol (ZYLOPRIM) 100 MG tablet Take 1 tablet (100 mg total) by mouth daily. 90 tablet 2  . apixaban (ELIQUIS) 5 MG TABS tablet Take 1 tablet (5 mg total) by mouth 2 (two) times daily. 180 tablet 3  . colchicine 0.6 MG tablet Take 1 tablet (0.6 mg total) by mouth daily. 20 tablet 0  . diltiazem (CARDIZEM CD) 240 MG 24 hr capsule Take 1 capsule (240 mg total) by mouth daily. 90 capsule 1  . memantine (NAMENDA) 10 MG tablet Take 10 mg by mouth 2 (two) times daily.   2  . metFORMIN (GLUCOPHAGE-XR) 750 MG 24 hr tablet Take 750 mg by mouth daily after supper.  2  . Multiple Vitamins-Minerals (ICAPS AREDS 2 PO) Take 1 tablet by mouth daily.    [name] 3-6-9 Fatty Acids (TRIPLE OMEGA COMPLEX PO) Take 1 tablet by mouth daily.     No current facility-administered medications for this visit.     Allergies:   Patient has no known allergies.    Social History:  The patient  reports that she has never smoked. She  has never used smokeless tobacco. She reports that she does not drink alcohol or use drugs.   Family History:  The patient's family history includes Asthma in her brother; Diabetes in her father.    ROS:  Please see the history of present illness.   Otherwise, review of systems are positive for none.   All other systems are reviewed and negative.    PHYSICAL EXAM: VS:  BP 110/68   Pulse 61   Ht 5\' 6"  (1.676 m)   Wt 76.6 kg   SpO2 95%   BMI 27.25 kg/m  , BMI Body mass index is 27.25 kg/m. Affect appropriate Frail elderly female  HEENT: normal Neck supple with no adenopathy JVP normal no bruits no thyromegaly Lungs clear with no wheezing and good diaphragmatic motion Heart:  S1/S2 no murmur, no rub, gallop or click PMI normal Abdomen: benighn, BS positve, no tenderness, no AAA no bruit.  No HSM or HJR Distal pulses intact with no bruits No edema Neuro non-focal Skin warm and dry No muscular weakness    EKG:  afib nonspecific ST changes 01/21/19 rate 65   Recent Labs: 01/02/2019: TSH 3.733 01/04/2019: Magnesium 1.7 01/05/2019: ALT QUANTITY NOT SUFFICIENT, UNABLE TO PERFORM TEST 01/07/2019: BUN  9; Creatinine, Ser 1.22; Hemoglobin 10.7; Platelets 254; Potassium 3.6; Sodium 141    Lipid Panel No results found for: CHOL, TRIG, HDL, CHOLHDL, VLDL, LDLCALC, LDLDIRECT    Wt Readings from Last 3 Encounters:  01/21/19 76.6 kg  01/07/19 78.9 kg  12/30/18 81.5 kg      Other studies Reviewed: Additional studies/ records that were reviewed today include: notes from hospital labs, CXR, ECG TTE.    ASSESSMENT AND PLAN:  1.  Afib:  Rate control with cardizem and anticoagulation with eliquis Echo benign no indication for [facility] as she is asymptomatic  2.  DM:  Discussed low carb diet.  Target hemoglobin A1c is 6.5 or less.  Continue current medications. 3. HTN:  Well controlled.  Continue current medications and low sodium Dash type diet.   .    Current medicines are reviewed  at length with the patient today.  The patient does not have concerns regarding medicines.  The following changes have been made:  None   Labs/ tests ordered today include: none  No orders of the defined types were placed in this encounter.    Disposition:   FU with cardiology in 6 months      Signed, Jenkins Rouge, MD  01/21/2019 11:00 AM    [address], [place], [place]  [address] Phone: [phone]; Fax: [phone]

## 2019-01-21 ENCOUNTER — Ambulatory Visit (INDEPENDENT_AMBULATORY_CARE_PROVIDER_SITE_OTHER): Payer: Medicare HMO | Admitting: Cardiovascular Disease

## 2019-01-21 ENCOUNTER — Encounter: Payer: Self-pay | Admitting: Cardiovascular Disease

## 2019-01-21 ENCOUNTER — Encounter

## 2019-01-21 VITALS — BP 110/68 | HR 61 | Ht 66.0 in | Wt 168.8 lb

## 2019-01-21 DIAGNOSIS — I480 Paroxysmal atrial fibrillation: Secondary | ICD-10-CM | POA: Diagnosis not present

## 2019-01-21 MED ORDER — DILTIAZEM HCL ER COATED BEADS 240 MG PO CP24: 240.0000 mg | ORAL_CAPSULE | Freq: Every day | ORAL | 1 refills | Status: DC

## 2019-01-21 MED ORDER — APIXABAN 5 MG PO TABS: 5.0000 mg | ORAL_TABLET | Freq: Two times a day (BID) | ORAL | 3 refills | Status: DC

## 2019-01-21 NOTE — Patient Instructions (Signed)

## 2019-01-24 NOTE — Addendum Note (Signed)
Addended by: Mendel Ryder on: 01/24/2019 08:21 AM   Modules accepted: Orders

## 2019-01-31 ENCOUNTER — Other Ambulatory Visit: Payer: Self-pay | Admitting: *Deleted

## 2019-01-31 MED ORDER — COLCHICINE 0.6 MG PO TABS: 0.6000 mg | ORAL_TABLET | Freq: Every day | ORAL | 2 refills | Status: DC

## 2019-01-31 NOTE — Progress Notes (Signed)
[facility] sent a refill request for Colchicine.  Dr. Trinidad Curet two refills.

## 2019-03-03 ENCOUNTER — Telehealth: Payer: Self-pay | Admitting: Podiatry

## 2019-03-03 NOTE — Telephone Encounter (Signed)
Needs refill of pain medication sent to [facility] on [address] and [address].

## 2019-03-03 NOTE — Telephone Encounter (Signed)
Left message informing pt, I had reviewed her clinicals and her 01/2019 colchicine rx had 2 refills and if she needed refills to have pharmacy send an escript request.

## 2019-03-04 ENCOUNTER — Encounter: Payer: Self-pay | Admitting: Podiatry

## 2019-03-04 ENCOUNTER — Other Ambulatory Visit: Payer: Self-pay

## 2019-03-04 ENCOUNTER — Ambulatory Visit (INDEPENDENT_AMBULATORY_CARE_PROVIDER_SITE_OTHER): Payer: Medicare HMO | Admitting: Podiatry

## 2019-03-04 VITALS — Temp 99.2°F

## 2019-03-04 DIAGNOSIS — M10072 Idiopathic gout, left ankle and foot: Secondary | ICD-10-CM

## 2019-03-04 DIAGNOSIS — M7751 Other enthesopathy of right foot: Secondary | ICD-10-CM | POA: Diagnosis not present

## 2019-03-04 DIAGNOSIS — M10471 Other secondary gout, right ankle and foot: Secondary | ICD-10-CM

## 2019-03-04 DIAGNOSIS — M109 Gout, unspecified: Secondary | ICD-10-CM

## 2019-03-04 MED ORDER — TRAMADOL HCL 50 MG PO TABS: 50.0000 mg | ORAL_TABLET | Freq: Four times a day (QID) | ORAL | 1 refills | Status: AC | PRN

## 2019-03-04 NOTE — Progress Notes (Signed)
   HPI: 85 year old female presents today for recurrence of an acute gout attack.  Patient has significant pain to the right forefoot as well as the left ankle which started early this morning.  She has been taking allopurinol daily.  She presents for further treatment evaluation  Past Medical History:  Diagnosis Date  . DM (diabetes mellitus) ([place])   . HTN (hypertension)   . Tremor      Physical Exam: General: The patient is alert and oriented x3 in no acute distress.  Dermatology: Skin is warm, dry and supple bilateral lower extremities. Negative for open lesions or macerations.  Vascular: Palpable pedal pulses bilaterally.  Capillary refill within normal limits.  Erythema and edema noted to the first MPJ and spreading diffusely throughout the forefoot right.  There is also some edema with minimal erythema noted to the left ankle  Neurological: Epicritic and protective threshold grossly intact bilaterally.   Musculoskeletal Exam: Range of motion within normal limits to all pedal and ankle joints bilateral. Muscle strength 5/5 in all groups bilateral.  Tenderness to palpation first MPJ right foot.  Tenderness to palpation left ankle medial aspect  Assessment: 1.  Acute gout/metatarsophalangeal capsulitis first right 2.  Acute gout/capsulitis left ankle   Plan of Care:  1. Patient evaluated.   2.  Injection of 0.5 cc Celestone Soluspan injected into the first MPJ and left ankle joint 3.  Prescription for tramadol 50 mg 4.  Patient picked up a prescription for colchicine yesterday.  Continue colchicine daily 5.  Lab orders placed for uric acid levels to get a baseline 6.  Continue allopurinol 100 mg daily.  Recommend that the patient follow-up with PCP for long-term maintenance of allopurinol medication and monitoring 7.  Return to clinic as needed      Edrick Kins, DPM [facility]  Dr. Edrick Kins, DPM    [address], [place] [address]                Office [phone]  Fax [phone]

## 2019-03-05 LAB — URIC ACID: Uric Acid: 5.2 mg/dL (ref 2.5–7.1)

## 2019-03-23 DIAGNOSIS — H353221 Exudative age-related macular degeneration, left eye, with active choroidal neovascularization: Secondary | ICD-10-CM | POA: Diagnosis not present

## 2019-04-05 ENCOUNTER — Telehealth: Payer: Self-pay | Admitting: *Deleted

## 2019-04-05 NOTE — Telephone Encounter (Signed)
Faxed LOV 03/04/2019 and 01/12/2019 with 03/04/2019 blood work to Dr. Lucianne Lei.

## 2019-04-05 NOTE — Telephone Encounter (Signed)
Pt's dtr, called request the Uric Acid results.

## 2019-04-05 NOTE — Telephone Encounter (Signed)
I called pt's dtr Alexandra and informed of the uric acid result of one month ago 5.2 03/04/2019, and informed it was in the mid-normal range. Pt's dtr asked why pt is still having pain. I told her she may have another problem at the site and offered an appt. Pt dtr stated Dr. Amalia Hailey had wanted her to contact her PCP. I asked if that was Dr. Criss Rosales and she stated it was. I told her I would faxed the last 2 office visits and 03/04/2019 lab to Dr. Lucianne Lei if she agreed. Alexandra agreed to the plan.

## 2019-04-26 ENCOUNTER — Ambulatory Visit: Payer: Medicare HMO | Admitting: Sports Medicine

## 2019-04-26 ENCOUNTER — Other Ambulatory Visit: Payer: Self-pay

## 2019-04-26 ENCOUNTER — Telehealth: Payer: Self-pay | Admitting: Podiatry

## 2019-04-26 ENCOUNTER — Encounter: Payer: Self-pay | Admitting: Sports Medicine

## 2019-04-26 VITALS — Temp 97.5°F

## 2019-04-26 DIAGNOSIS — M7752 Other enthesopathy of left foot: Secondary | ICD-10-CM | POA: Diagnosis not present

## 2019-04-26 DIAGNOSIS — M10471 Other secondary gout, right ankle and foot: Secondary | ICD-10-CM

## 2019-04-26 DIAGNOSIS — M7751 Other enthesopathy of right foot: Secondary | ICD-10-CM

## 2019-04-26 DIAGNOSIS — M109 Gout, unspecified: Secondary | ICD-10-CM

## 2019-04-26 MED ORDER — METHYLPREDNISOLONE 4 MG PO TBPK: ORAL_TABLET | ORAL | 0 refills | Status: DC

## 2019-04-26 NOTE — Telephone Encounter (Signed)
Pts daughter called stating that the pt is in severe pain and would like to know if we have recieved results from her uric acid test and would like to know what she can do for pain.

## 2019-04-26 NOTE — Telephone Encounter (Signed)
Pt's dtr, [place] called states she missed my call. I informed [place] that I had spoken with her 04/05/2019 and pt was directed to see DR. Bland for the continued pain, but if she was unable to see Dr. Criss Rosales our doctors would be happy to see pt, but if she was in severe pain she would definitely needed to be evaluated and I transferred [place] to schedulers.

## 2019-04-26 NOTE — Telephone Encounter (Signed)
Left message informing pt's dtr, Hilda at our last conversation at the end of May, pt was directed to be evaluated by PCP Dr. Criss Rosales and if she was having severe pain our doctors would like pt to be evaluated either in-office or by a virtual visit and to call to schedule.

## 2019-04-26 NOTE — Progress Notes (Signed)
Subjective: Anne Roach is a 85 y.o. female patient who presents to office for evaluation of Right> Left foot pain. Patient complains of progressive pain especially over the last year in the Right>Left foot at the <location>. Ranks pain ____/10. Admits to warmth, redness, and swelling to the area that is unrelieved. Patient has tried <----> with no relief in symptoms. Admits to previous gouty attack/family history/kidney disease/change in diet. Patient denies any other pedal complaints.   Patient Active Problem List   Diagnosis Date Noted  . Hypervolemia   . Campylobacter gastrointestinal tract infection 01/03/2019  . Chronic kidney disease (CKD), stage III (moderate) ([place]) 01/02/2019  . AKI (acute kidney injury) ([place]) 01/02/2019  . Hypovolemia with active loss of fluid 01/02/2019  . Hypokalemia 01/02/2019  . Hypomagnesemia 01/02/2019  . Hypoalbuminemia 01/02/2019  . Bandemia without diagnosis of specific infection 01/02/2019  . Hypotension 01/02/2019  . Fatigue   . Sepsis ([place])   . Bacteria in urine 01/01/2019  . Atrial fibrillation with RVR ([place]) 03/20/2017  . Myogenic ptosis of bilateral eyelids 06/24/2016  . HTN (hypertension)   . Tremor   . Exudative age-related macular degeneration of right eye with active choroidal neovascularization ([place]) 08/29/2014  . Pseudophakia of both eyes 08/29/2014  . OSA (obstructive sleep apnea) 06/29/2012  . DM (diabetes mellitus) ([place])   . Incidental lung nodule 03/10/2009  . Other diseases of lung, not elsewhere classified 03/10/2009    Current Outpatient Medications on File Prior to Visit  Medication Sig Dispense Refill  . allopurinol (ZYLOPRIM) 100 MG tablet Take 1 tablet (100 mg total) by mouth daily. 90 tablet 2  . apixaban (ELIQUIS) 5 MG TABS tablet Take 1 tablet (5 mg total) by mouth 2 (two) times daily. 180 tablet 3  . colchicine 0.6 MG tablet Take 1 tablet (0.6 mg total) by mouth daily. (Patient taking differently: Take 0.6 mg by  mouth as needed. ) 20 tablet 2  . diltiazem (CARDIZEM CD) 240 MG 24 hr capsule Take 1 capsule (240 mg total) by mouth daily. 90 capsule 1  . memantine (NAMENDA) 10 MG tablet Take 10 mg by mouth 2 (two) times daily. For memory  2  . metFORMIN (GLUCOPHAGE-XR) 750 MG 24 hr tablet Take 750 mg by mouth daily after supper.  2  . Multiple Vitamins-Minerals (ICAPS AREDS 2 PO) Take 1 tablet by mouth daily.    [name] 3-6-9 Fatty Acids (TRIPLE OMEGA COMPLEX PO) Take 1 tablet by mouth daily.     No current facility-administered medications on file prior to visit.     No Known Allergies  Objective:  General: Alert and oriented x3 in no acute distress  Dermatology: Focal Swelling, warmth, redness present on the Right/left foot at <location>, No open lesions bilateral lower extremities, no webspace macerations, no ecchymosis bilateral, all nails x 10 are well manicured.  Vascular: Dorsalis Pedis and Posterior Tibial pedal pulses 1/4, Capillary Fill Time 3 seconds,(+) pedal hair growth bilateral,Temperature gradient increased over the Right/left <location>.  Neurology: Gross sensation intact via light touch bilateral, (+/- )Tinels sign right/left foot.   Musculoskeletal: There is tenderness with palpation at <---> on Right>Left foot,No pain with calf compression bilateral. All joint range of motion is within normal limits except at the right/left <location> where there is pain and limiation, Strength within normal limits in all groups bilateral.   Gait: Unassisted, Antalgic gait avoiding weight on left/right foot  Xrays  Left/Right Foot    Impression:  Assessment and Plan: Problem List Items Addressed This Visit    None    Visit Diagnoses    Acute gout of left ankle, unspecified cause    -  Primary   Relevant Medications   methylPREDNISolone (MEDROL DOSEPAK) 4 MG TBPK tablet   Acute gout due to other secondary cause involving toe of right foot       Relevant Medications    methylPREDNISolone (MEDROL DOSEPAK) 4 MG TBPK tablet   Capsulitis of metatarsophalangeal (MTP) joint of left foot       Relevant Medications   methylPREDNISolone (MEDROL DOSEPAK) 4 MG TBPK tablet   Capsulitis of metatarsophalangeal (MTP) joint of right foot       Relevant Medications   methylPREDNISolone (MEDROL DOSEPAK) 4 MG TBPK tablet   Gouty arthritis of both great toes       Relevant Medications   methylPREDNISolone (MEDROL DOSEPAK) 4 MG TBPK tablet       -Complete examination performed -Xrays reviewed -Discussed treatement options for gouty arthritis and gout education provided/ - After oral consent, injected left medial ankle and 1st MTPJ on left with 1cc lidocaine and marcaine plain mixed with 0.5cc Kenalog-10 and Dexmethasone phosphate without complication; post injection care explained. -Rx Medrol dose pak and advised patient that her sugars will spike while on this medication -Continue with Allopurinol since patient is having a hard time getting any reply from her PCP with long term gout medication; referral placed to rheumtaology  -Advised patient to call if symptoms are not improved within 1 week -Patient to return in PRN or sooner if condition worsens.  Landis Martins, DPM

## 2019-04-27 ENCOUNTER — Telehealth: Payer: Self-pay | Admitting: *Deleted

## 2019-04-27 DIAGNOSIS — M109 Gout, unspecified: Secondary | ICD-10-CM

## 2019-04-27 NOTE — Telephone Encounter (Signed)
-----   Message from Landis Martins, [place] sent at 04/26/2019  5:19 PM EDT ----- Regarding: Refer to Rheumatology Gout

## 2019-04-27 NOTE — Telephone Encounter (Signed)
Faxed required form, clinical and demographics to [facility].

## 2019-05-04 DIAGNOSIS — M2550 Pain in unspecified joint: Secondary | ICD-10-CM | POA: Diagnosis not present

## 2019-05-04 DIAGNOSIS — E663 Overweight: Secondary | ICD-10-CM | POA: Diagnosis not present

## 2019-05-04 DIAGNOSIS — Z6827 Body mass index (BMI) 27.0-27.9, adult: Secondary | ICD-10-CM | POA: Diagnosis not present

## 2019-05-04 DIAGNOSIS — M1A09X0 Idiopathic chronic gout, multiple sites, without tophus (tophi): Secondary | ICD-10-CM | POA: Diagnosis not present

## 2019-05-11 DIAGNOSIS — H353211 Exudative age-related macular degeneration, right eye, with active choroidal neovascularization: Secondary | ICD-10-CM | POA: Diagnosis not present

## 2019-05-25 DIAGNOSIS — H353221 Exudative age-related macular degeneration, left eye, with active choroidal neovascularization: Secondary | ICD-10-CM | POA: Diagnosis not present

## 2019-05-27 DIAGNOSIS — A045 Campylobacter enteritis: Secondary | ICD-10-CM | POA: Diagnosis not present

## 2019-05-27 DIAGNOSIS — N390 Urinary tract infection, site not specified: Secondary | ICD-10-CM | POA: Diagnosis not present

## 2019-05-27 DIAGNOSIS — B9620 Unspecified Escherichia coli [E. coli] as the cause of diseases classified elsewhere: Secondary | ICD-10-CM | POA: Diagnosis not present

## 2019-05-27 DIAGNOSIS — A419 Sepsis, unspecified organism: Secondary | ICD-10-CM | POA: Diagnosis not present

## 2019-06-01 DIAGNOSIS — M0579 Rheumatoid arthritis with rheumatoid factor of multiple sites without organ or systems involvement: Secondary | ICD-10-CM | POA: Diagnosis not present

## 2019-06-01 DIAGNOSIS — M2550 Pain in unspecified joint: Secondary | ICD-10-CM | POA: Diagnosis not present

## 2019-06-01 DIAGNOSIS — Z6827 Body mass index (BMI) 27.0-27.9, adult: Secondary | ICD-10-CM | POA: Diagnosis not present

## 2019-06-01 DIAGNOSIS — M1A09X0 Idiopathic chronic gout, multiple sites, without tophus (tophi): Secondary | ICD-10-CM | POA: Diagnosis not present

## 2019-06-01 DIAGNOSIS — E663 Overweight: Secondary | ICD-10-CM | POA: Diagnosis not present

## 2019-06-17 DIAGNOSIS — E782 Mixed hyperlipidemia: Secondary | ICD-10-CM | POA: Diagnosis not present

## 2019-06-17 DIAGNOSIS — I10 Essential (primary) hypertension: Secondary | ICD-10-CM | POA: Diagnosis not present

## 2019-06-17 DIAGNOSIS — E1169 Type 2 diabetes mellitus with other specified complication: Secondary | ICD-10-CM | POA: Diagnosis not present

## 2019-06-17 DIAGNOSIS — J328 Other chronic sinusitis: Secondary | ICD-10-CM | POA: Diagnosis not present

## 2019-07-05 DIAGNOSIS — H353211 Exudative age-related macular degeneration, right eye, with active choroidal neovascularization: Secondary | ICD-10-CM | POA: Diagnosis not present

## 2019-07-13 DIAGNOSIS — H353221 Exudative age-related macular degeneration, left eye, with active choroidal neovascularization: Secondary | ICD-10-CM | POA: Diagnosis not present

## 2019-08-04 ENCOUNTER — Other Ambulatory Visit: Payer: Self-pay | Admitting: Cardiovascular Disease

## 2019-08-16 NOTE — Progress Notes (Signed)
Cardiology Office Note   Date:  08/18/2019   ID:  Anne Roach, DOB 06-06-1933, MRN [id number]  PCP:  Anne Lei, MD  Cardiologist:   Anne Rouge, MD   No chief complaint on file.     History of Present Illness: Anne Roach is a 85 y.o. female who presents for post hospital f/u afib. 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 Lots of swelling in ankles and feet. Actually  Looks more like lymphedema.     Past Medical History:  Diagnosis Date  . DM (diabetes mellitus) ([place])   . HTN (hypertension)   . Tremor     Past Surgical History:  Procedure Laterality Date  . APPENDECTOMY    . CARPAL TUNNEL RELEASE    . CATARACT EXTRACTION Bilateral   . HERNIA REPAIR       Current Outpatient Medications  Medication Sig Dispense Refill  . allopurinol (ZYLOPRIM) 100 MG tablet Take 1 tablet (100 mg total) by mouth daily. 90 tablet 2  . amLODipine (NORVASC) 5 MG tablet Take 1 tablet by mouth daily.    Marland Kitchen apixaban (ELIQUIS) 5 MG TABS tablet Take 1 tablet (5 mg total) by mouth 2 (two) times daily. 180 tablet 3  . colchicine 0.6 MG tablet Take 1 tablet (0.6 mg total) by mouth daily. (Patient taking differently: Take 0.6 mg by mouth as needed. ) 20 tablet 2  . diltiazem (CARDIZEM CD) 240 MG 24 hr capsule TAKE 1 CAPSULE(240 MG) BY MOUTH DAILY 90 capsule 1  . folic acid (FOLVITE) 1 MG tablet Take 1 tablet by mouth daily.    . memantine (NAMENDA) 10 MG tablet Take 10 mg by mouth 2 (two) times daily. For memory  2  . metFORMIN (GLUCOPHAGE-XR) 750 MG 24 hr tablet Take 750 mg by mouth daily after supper.  2  . Methotrexate, Anti-Rheumatic, (METHOTREXATE, PF,  [place]) Inject 0.6 mLs into the skin once a week.    . Multiple Vitamins-Minerals (ICAPS AREDS 2 PO) Take 1 tablet by mouth daily.    [name] 3-6-9 Fatty Acids (TRIPLE OMEGA COMPLEX PO) Take 1 tablet by mouth daily.     No current facility-administered medications for this visit.     Allergies:   Patient has no known allergies.    Social History:  The patient  reports that she has never smoked. She has never used smokeless tobacco. She reports that she does not drink alcohol or use drugs.   Family History:  The patient's family history includes Asthma in her brother; Diabetes in her father.    ROS:  Please see the history of present illness.   Otherwise, review of systems are positive for none.   All other systems are reviewed and negative.    PHYSICAL EXAM: VS:  BP 112/72   Pulse 78   Ht 5\' 6"  (1.676 m)   Wt 177 lb (80.3 kg)   SpO2 97%   BMI 28.57 kg/m  , BMI Body mass index is 28.57 kg/m. Affect appropriate Frail elderly female  HEENT: normal Neck supple with no adenopathy JVP normal no bruits no thyromegaly Lungs clear with no wheezing and  good diaphragmatic motion Heart:  S1/S2 no murmur, no rub, gallop or click PMI normal Abdomen: benighn, BS positve, no tenderness, no AAA no bruit.  No HSM or HJR Distal pulses intact with no bruits No edema Neuro non-focal Skin warm and dry No muscular weakness    EKG:  afib nonspecific ST changes 01/21/19 rate 65   Recent Labs: 01/02/2019: TSH 3.733 01/04/2019: Magnesium 1.7 01/05/2019: ALT QUANTITY NOT SUFFICIENT, UNABLE TO PERFORM TEST 01/07/2019: BUN 9; Creatinine, Ser 1.22; Hemoglobin 10.7; Platelets 254; Potassium 3.6; Sodium 141    Lipid Panel No results found for: CHOL, TRIG, HDL, CHOLHDL, VLDL, LDLCALC, LDLDIRECT    Wt Readings from Last 3 Encounters:  08/18/19 177 lb (80.3 kg)  01/21/19 168 lb 12.8 oz (76.6 kg)  01/07/19 174 lb (78.9 kg)      Other studies Reviewed: Additional studies/ records that were  reviewed today include: notes from hospital labs, CXR, ECG TTE.    ASSESSMENT AND PLAN:  1.  Afib:  Rate control with cardizem and anticoagulation with eliquis Echo benign no indication for [facility] as she is asymptomatic  2.  DM:  Discussed low carb diet.  Target hemoglobin A1c is 6.5 or less.  Continue current medications. 3. HTN:  With edema in ankles d/c norvasc start HCTZ 12.5 mg daily f/u BMET in 3 weeks  4. Dementia:  Continue Namenda f/u neurology .    Current medicines are reviewed at length with the patient today.  The patient does not have concerns regarding medicines.  The following changes have been made:  D/c norvasc start HCTZ 12.5 daily   Labs/ tests ordered today include:  BMET in 3 weeks  No orders of the defined types were placed in this encounter.    Disposition:   FU with cardiology in 6 months      Signed, Anne Rouge, MD  08/18/2019 9:41 AM    [address], [place], [place]  [address] Phone: [phone]; Fax: [phone]

## 2019-08-18 ENCOUNTER — Encounter: Payer: Self-pay | Admitting: Cardiovascular Disease

## 2019-08-18 ENCOUNTER — Ambulatory Visit (INDEPENDENT_AMBULATORY_CARE_PROVIDER_SITE_OTHER): Payer: Medicare HMO | Admitting: Cardiovascular Disease

## 2019-08-18 ENCOUNTER — Other Ambulatory Visit: Payer: Self-pay

## 2019-08-18 VITALS — BP 112/72 | HR 78 | Ht 66.0 in | Wt 177.0 lb

## 2019-08-18 DIAGNOSIS — I10 Essential (primary) hypertension: Secondary | ICD-10-CM

## 2019-08-18 DIAGNOSIS — R609 Edema, unspecified: Secondary | ICD-10-CM | POA: Diagnosis not present

## 2019-08-18 MED ORDER — HYDROCHLOROTHIAZIDE 12.5 MG PO CAPS: 12.5000 mg | ORAL_CAPSULE | Freq: Every day | ORAL | 3 refills | Status: DC

## 2019-08-18 NOTE — Patient Instructions (Signed)
Medication Instructions:  Your physician has recommended you make the following change in your medication:  1-START Hydrochlorothiazide 12.5 mg by mouth daily 2-STOP Norvasc  If you need a refill on your cardiac medications before your next appointment, please call your pharmacy.   Lab work: Your physician recommends that you return for lab work in: 3 weeks for BMET  If you have labs (blood work) drawn today and your tests are completely normal, you will receive your results only by: Marland Kitchen MyChart Message (if you have MyChart) OR . A paper copy in the mail If you have any lab test that is abnormal or we need to change your treatment, we will call you to review the results.  Testing/Procedures: None ordered today  Follow-Up: At [facility], you and your health needs are our priority.  As part of our continuing mission to provide you with exceptional heart care, we have created designated Provider Care Teams.  These Care Teams include your primary Cardiologist (physician) and Advanced Practice Providers (APPs -  Physician Assistants and Nurse Practitioners) who all work together to provide you with the care you need, when you need it. You will need a follow up appointment in 6 months.  Please call our office 2 months in advance to schedule this appointment.  You may see Dr. Johnsie Cancel or one of the following Advanced Practice Providers on your designated Care Team:   Truitt Merle, NP Cecilie Kicks, NP . Kathyrn Drown, NP

## 2019-08-24 DIAGNOSIS — H353221 Exudative age-related macular degeneration, left eye, with active choroidal neovascularization: Secondary | ICD-10-CM | POA: Diagnosis not present

## 2019-09-07 ENCOUNTER — Other Ambulatory Visit: Payer: Medicare HMO

## 2019-09-07 DIAGNOSIS — J1289 Other viral pneumonia: Secondary | ICD-10-CM | POA: Diagnosis not present

## 2019-09-07 DIAGNOSIS — E1169 Type 2 diabetes mellitus with other specified complication: Secondary | ICD-10-CM | POA: Diagnosis not present

## 2019-09-07 DIAGNOSIS — E782 Mixed hyperlipidemia: Secondary | ICD-10-CM | POA: Diagnosis not present

## 2019-09-07 DIAGNOSIS — I10 Essential (primary) hypertension: Secondary | ICD-10-CM | POA: Diagnosis not present

## 2019-09-07 DIAGNOSIS — J0100 Acute maxillary sinusitis, unspecified: Secondary | ICD-10-CM | POA: Diagnosis not present

## 2019-09-08 ENCOUNTER — Other Ambulatory Visit: Payer: Medicare HMO

## 2019-09-15 ENCOUNTER — Emergency Department (HOSPITAL_COMMUNITY): Payer: Medicare HMO

## 2019-09-15 ENCOUNTER — Other Ambulatory Visit: Payer: Self-pay

## 2019-09-15 ENCOUNTER — Emergency Department (HOSPITAL_COMMUNITY)
Admission: EM | Admit: 2019-09-15 | Discharge: 2019-09-15 | Disposition: A | Discharge status: Home / Self Care | Payer: Medicare HMO | Attending: Emergency Medicine | Admitting: Emergency Medicine

## 2019-09-15 DIAGNOSIS — I4891 Unspecified atrial fibrillation: Secondary | ICD-10-CM | POA: Diagnosis not present

## 2019-09-15 DIAGNOSIS — E119 Type 2 diabetes mellitus without complications: Secondary | ICD-10-CM | POA: Diagnosis not present

## 2019-09-15 DIAGNOSIS — Z209 Contact with and (suspected) exposure to unspecified communicable disease: Secondary | ICD-10-CM | POA: Diagnosis not present

## 2019-09-15 DIAGNOSIS — Z7901 Long term (current) use of anticoagulants: Secondary | ICD-10-CM | POA: Insufficient documentation

## 2019-09-15 DIAGNOSIS — R0789 Other chest pain: Secondary | ICD-10-CM | POA: Diagnosis not present

## 2019-09-15 DIAGNOSIS — N183 Chronic kidney disease, stage 3 unspecified: Secondary | ICD-10-CM | POA: Diagnosis not present

## 2019-09-15 DIAGNOSIS — R079 Chest pain, unspecified: Secondary | ICD-10-CM | POA: Diagnosis not present

## 2019-09-15 DIAGNOSIS — Z7984 Long term (current) use of oral hypoglycemic drugs: Secondary | ICD-10-CM | POA: Insufficient documentation

## 2019-09-15 DIAGNOSIS — Z79899 Other long term (current) drug therapy: Secondary | ICD-10-CM | POA: Diagnosis not present

## 2019-09-15 DIAGNOSIS — J40 Bronchitis, not specified as acute or chronic: Secondary | ICD-10-CM | POA: Diagnosis not present

## 2019-09-15 DIAGNOSIS — R05 Cough: Secondary | ICD-10-CM | POA: Diagnosis not present

## 2019-09-15 DIAGNOSIS — I491 Atrial premature depolarization: Secondary | ICD-10-CM | POA: Diagnosis not present

## 2019-09-15 DIAGNOSIS — I129 Hypertensive chronic kidney disease with stage 1 through stage 4 chronic kidney disease, or unspecified chronic kidney disease: Secondary | ICD-10-CM | POA: Diagnosis not present

## 2019-09-15 DIAGNOSIS — I10 Essential (primary) hypertension: Secondary | ICD-10-CM | POA: Diagnosis not present

## 2019-09-15 LAB — CBC WITH DIFFERENTIAL/PLATELET
Abs Immature Granulocytes: 0.1 10*3/uL — ABNORMAL HIGH (ref 0.00–0.07)
Basophils Absolute: 0.1 10*3/uL (ref 0.0–0.1)
Basophils Relative: 1 %
Eosinophils Absolute: 0.1 10*3/uL (ref 0.0–0.5)
Eosinophils Relative: 1 %
HCT: 37.9 % (ref 36.0–46.0)
Hemoglobin: 12.3 g/dL (ref 12.0–15.0)
Immature Granulocytes: 1 %
Lymphocytes Relative: 30 %
Lymphs Abs: 3 10*3/uL (ref 0.7–4.0)
MCH: 32.1 pg (ref 26.0–34.0)
MCHC: 32.5 g/dL (ref 30.0–36.0)
MCV: 99 fL (ref 80.0–100.0)
Monocytes Absolute: 1.1 10*3/uL — ABNORMAL HIGH (ref 0.1–1.0)
Monocytes Relative: 11 %
Neutro Abs: 5.8 10*3/uL (ref 1.7–7.7)
Neutrophils Relative %: 56 %
Platelets: 196 10*3/uL (ref 150–400)
RBC: 3.83 MIL/uL — ABNORMAL LOW (ref 3.87–5.11)
RDW: 15.9 % — ABNORMAL HIGH (ref 11.5–15.5)
WBC: 10.3 10*3/uL (ref 4.0–10.5)
nRBC: 0 % (ref 0.0–0.2)

## 2019-09-15 LAB — COMPREHENSIVE METABOLIC PANEL
ALT: 21 U/L (ref 0–44)
AST: 24 U/L (ref 15–41)
Albumin: 3.4 g/dL — ABNORMAL LOW (ref 3.5–5.0)
Alkaline Phosphatase: 42 U/L (ref 38–126)
Anion gap: 12 (ref 5–15)
BUN: 13 mg/dL (ref 8–23)
CO2: 23 mmol/L (ref 22–32)
Calcium: 9.1 mg/dL (ref 8.9–10.3)
Chloride: 106 mmol/L (ref 98–111)
Creatinine, Ser: 1.1 mg/dL — ABNORMAL HIGH (ref 0.44–1.00)
GFR calc Af Amer: 53 mL/min — ABNORMAL LOW (ref >60)
GFR calc non Af Amer: 46 mL/min — ABNORMAL LOW (ref >60)
Glucose, Bld: 104 mg/dL — ABNORMAL HIGH (ref 70–99)
Potassium: 3.7 mmol/L (ref 3.5–5.1)
Sodium: 141 mmol/L (ref 135–145)
Total Bilirubin: 0.8 mg/dL (ref 0.3–1.2)
Total Protein: 6.4 g/dL — ABNORMAL LOW (ref 6.5–8.1)

## 2019-09-15 MED ORDER — HYDROCOD POLST-CPM POLST ER 10-8 MG/5ML PO SUER: 5.0000 mL | Freq: Two times a day (BID) | ORAL | 0 refills | Status: DC | PRN

## 2019-09-15 MED ORDER — HYDROCODONE-ACETAMINOPHEN 5-325 MG PO TABS
1.0000 | ORAL_TABLET | Freq: Once | ORAL | Status: AC
  Administered 2019-09-15: 1 via ORAL
  Filled 2019-09-15: qty 1

## 2019-09-15 NOTE — Discharge Instructions (Addendum)
Continue taking your antibiotics.  Pick up the cough medicine from the pharmacy and use that as needed.  Follow-up with your doctor next week as planned

## 2019-09-15 NOTE — ED Provider Notes (Signed)
[facility] EMERGENCY DEPARTMENT Provider Note   CSN: [id number] Arrival date & time: 09/15/19  [id number]     History   Chief Complaint Chief Complaint  Patient presents with  . Chest Pain    HPI Anne Roach is a 85 y.o. female.     Patient has been having a cough for couple weeks now her doctor placed her on doxycycline and amoxicillin 1 week ago.  Patient has continued to cough.  She has had a negative Covid last week  The history is provided by the patient and a relative. No language interpreter was used.  Cough Cough characteristics:  Productive Sputum characteristics:  Nondescript Severity:  Moderate Onset quality:  Sudden Timing:  Constant Progression:  Waxing and waning Chronicity:  Recurrent Smoker: no   Associated symptoms: no chest pain, no eye discharge, no headaches and no rash     Past Medical History:  Diagnosis Date  . DM (diabetes mellitus) ([place])   . HTN (hypertension)   . Tremor     Patient Active Problem List   Diagnosis Date Noted  . Hypervolemia   . Campylobacter gastrointestinal tract infection 01/03/2019  . Chronic kidney disease (CKD), stage III (moderate) 01/02/2019  . AKI (acute kidney injury) ([place]) 01/02/2019  . Hypovolemia with active loss of fluid 01/02/2019  . Hypokalemia 01/02/2019  . Hypomagnesemia 01/02/2019  . Hypoalbuminemia 01/02/2019  . Bandemia without diagnosis of specific infection 01/02/2019  . Hypotension 01/02/2019  . Fatigue   . Sepsis ([place])   . Bacteria in urine 01/01/2019  . Atrial fibrillation with RVR ([place]) 03/20/2017  . Myogenic ptosis of bilateral eyelids 06/24/2016  . HTN (hypertension)   . Tremor   . Exudative age-related macular degeneration of right eye with active choroidal neovascularization ([place]) 08/29/2014  . Pseudophakia of both eyes 08/29/2014  . OSA (obstructive sleep apnea) 06/29/2012  . DM (diabetes mellitus) ([place])   . Incidental lung nodule 03/10/2009  . Other diseases  of lung, not elsewhere classified 03/10/2009    Past Surgical History:  Procedure Laterality Date  . APPENDECTOMY    . CARPAL TUNNEL RELEASE    . CATARACT EXTRACTION Bilateral   . HERNIA REPAIR       OB History   No obstetric history on file.      Home Medications    Prior to Admission medications   Medication Sig Start Date End Date Taking? Authorizing Provider  allopurinol (ZYLOPRIM) 100 MG tablet Take 1 tablet (100 mg total) by mouth daily. 01/12/19   Edrick Kins, DPM  apixaban (ELIQUIS) 5 MG TABS tablet Take 1 tablet (5 mg total) by mouth 2 (two) times daily. 01/21/19   Josue Hector, MD  chlorpheniramine-HYDROcodone (TUSSIONEX PENNKINETIC ER) 10-8 MG/5ML SUER Take 5 mLs by mouth every 12 (twelve) hours as needed for cough. 09/15/19   Milton Ferguson, MD  colchicine 0.6 MG tablet Take 1 tablet (0.6 mg total) by mouth daily. Patient taking differently: Take 0.6 mg by mouth as needed.  01/31/19   Edrick Kins, DPM  diltiazem (CARDIZEM CD) 240 MG 24 hr capsule TAKE 1 CAPSULE(240 MG) BY MOUTH DAILY 08/05/19   Josue Hector, MD  folic acid (FOLVITE) 1 MG tablet Take 1 tablet by mouth daily. 06/01/19   [provider]  hydrochlorothiazide (MICROZIDE) 12.5 MG capsule Take 1 capsule (12.5 mg total) by mouth daily. 08/18/19   Josue Hector, MD  memantine (NAMENDA) 10 MG tablet Take 10 mg by mouth 2 (  two) times daily. For memory 08/17/14   [provider]  metFORMIN (GLUCOPHAGE-XR) 750 MG 24 hr tablet Take 750 mg by mouth daily after supper. 05/14/15   [provider]  Methotrexate, Anti-Rheumatic, (METHOTREXATE, PF, [place]) Inject 0.6 mLs into the skin once a week.    [provider]  Multiple Vitamins-Minerals (ICAPS AREDS 2 PO) Take 1 tablet by mouth daily.    [provider]  Omega 3-6-9 Fatty Acids (TRIPLE OMEGA COMPLEX PO) Take 1 tablet by mouth daily.    [provider]    Family History Family History  Problem Relation Age of  Onset  . Diabetes Father   . Asthma Brother     Social History Social History   Tobacco Use  . Smoking status: Never Smoker  . Smokeless tobacco: Never Used  Substance Use Topics  . Alcohol use: No    Alcohol/week: 0.0 standard drinks  . Drug use: No     Allergies   Patient has no known allergies.   Review of Systems Review of Systems  Constitutional: Negative for appetite change and fatigue.  HENT: Negative for congestion, ear discharge and sinus pressure.   Eyes: Negative for discharge.  Respiratory: Positive for cough.   Cardiovascular: Negative for chest pain.  Gastrointestinal: Negative for abdominal pain and diarrhea.  Genitourinary: Negative for frequency and hematuria.  Musculoskeletal: Negative for back pain.  Skin: Negative for rash.  Neurological: Negative for seizures and headaches.  Psychiatric/Behavioral: Negative for hallucinations.     Physical Exam Updated Vital Signs BP 137/78   Pulse 71   Temp 98.7 F (37.1 C) (Oral)   Resp 20   SpO2 94%   Physical Exam Vitals signs and nursing note reviewed.  Constitutional:      Appearance: She is well-developed.  HENT:     Head: Normocephalic.     Nose: Nose normal.  Eyes:     General: No scleral icterus.    Conjunctiva/sclera: Conjunctivae normal.  Neck:     Musculoskeletal: Neck supple.     Thyroid: No thyromegaly.  Cardiovascular:     Rate and Rhythm: Normal rate and regular rhythm.     Heart sounds: No murmur. No friction rub. No gallop.   Pulmonary:     Breath sounds: No stridor. Rales present. No wheezing.  Chest:     Chest wall: No tenderness.  Abdominal:     General: There is no distension.     Tenderness: There is no abdominal tenderness. There is no rebound.  Musculoskeletal: Normal range of motion.  Lymphadenopathy:     Cervical: No cervical adenopathy.  Skin:    Findings: No erythema or rash.  Neurological:     Mental Status: She is oriented to person, place, and time.      Motor: No abnormal muscle tone.     Coordination: Coordination normal.  Psychiatric:        Behavior: Behavior normal.      ED Treatments / Results  Labs (all labs ordered are listed, but only abnormal results are displayed) Labs Reviewed  CBC WITH DIFFERENTIAL/PLATELET - Abnormal; Notable for the following components:      Result Value   RBC 3.83 (*)    RDW 15.9 (*)    Monocytes Absolute 1.1 (*)    Abs Immature Granulocytes 0.10 (*)    All other components within normal limits  COMPREHENSIVE METABOLIC PANEL - Abnormal; Notable for the following components:   Glucose, Bld 104 (*)  Creatinine, Ser 1.10 (*)    Total Protein 6.4 (*)    Albumin 3.4 (*)    GFR calc non Af Amer 46 (*)    GFR calc Af Amer 53 (*)    All other components within normal limits    EKG EKG Interpretation  Date/Time:  Thursday September 15 2019 08:03:40 EDT Ventricular Rate:  80 PR Interval:    QRS Duration: 100 QT Interval:  401 QTC Calculation: 463 R Axis:   -15 Text Interpretation: Atrial fibrillation Borderline left axis deviation Borderline T abnormalities, diffuse leads Confirmed by Milton Ferguson [phone]) on 09/15/2019 8:15:52 AM Also confirmed by Milton Ferguson [phone])  on 09/15/2019 8:28:10 AM   Radiology Dg Chest 2 View  Result Date: 09/15/2019 CLINICAL DATA:  Cough. EXAM: CHEST - 2 VIEW COMPARISON:  01/06/2019. FINDINGS: Mediastinum hilar structures normal. Cardiomegaly with normal pulmonary vascularity. Low lung volumes with mild basilar atelectasis, improved aeration from prior study of 01/06/2019. No pleural effusion or pneumothorax. Degenerative change thoracic spine. IMPRESSION: Cardiomegaly. No pulmonary venous congestion. Low lung volumes with mild bibasilar atelectasis. Electronically Signed   By: Marcello Moores  Register   On: 09/15/2019 09:02    Procedures Procedures (including critical care time)  Medications Ordered in ED Medications  HYDROcodone-acetaminophen (NORCO/VICODIN)  5-325 MG per tablet 1 tablet (1 tablet Oral Given 09/15/19 1034)     Initial Impression / Assessment and Plan / ED Course  I have reviewed the triage vital signs and the nursing notes.  Pertinent labs & imaging results that were available during my care of the patient were reviewed by me and considered in my medical decision making (see chart for details).        Labs and chest x-ray unremarkable.  Patient's cough and chest discomfort improved with Vicodin.  She will be discharged home with some Tussionex and continue take her doxycycline amoxicillin and follow-up with her PCP  Final Clinical Impressions(s) / ED Diagnoses   Final diagnoses:  Bronchitis    ED Discharge Orders         Ordered    chlorpheniramine-HYDROcodone (TUSSIONEX PENNKINETIC ER) 10-8 MG/5ML SUER  Every 12 hours PRN     09/15/19 1159           Milton Ferguson, MD 09/15/19 1202

## 2019-09-15 NOTE — ED Notes (Signed)
Patient transported to X-ray 

## 2019-09-15 NOTE — ED Triage Notes (Signed)
Pt BIB [facility] for intermittent chest pain that started yesterday morning. Per EMS patient also has a productive cough that her doctor prescribed her abx for last week. EMS reports patient rested comfortably en route and denied any current chest pain for them. EMS reports daughter is on her way here.

## 2019-09-21 DIAGNOSIS — I10 Essential (primary) hypertension: Secondary | ICD-10-CM | POA: Diagnosis not present

## 2019-09-21 DIAGNOSIS — J449 Chronic obstructive pulmonary disease, unspecified: Secondary | ICD-10-CM | POA: Diagnosis not present

## 2019-09-21 DIAGNOSIS — J45909 Unspecified asthma, uncomplicated: Secondary | ICD-10-CM | POA: Diagnosis not present

## 2019-09-22 DIAGNOSIS — H353211 Exudative age-related macular degeneration, right eye, with active choroidal neovascularization: Secondary | ICD-10-CM | POA: Diagnosis not present

## 2019-09-22 DIAGNOSIS — H05232 Hemorrhage of left orbit: Secondary | ICD-10-CM | POA: Diagnosis not present

## 2019-09-22 DIAGNOSIS — S0512XA Contusion of eyeball and orbital tissues, left eye, initial encounter: Secondary | ICD-10-CM | POA: Diagnosis not present

## 2019-10-05 DIAGNOSIS — R42 Dizziness and giddiness: Secondary | ICD-10-CM | POA: Diagnosis not present

## 2019-10-05 DIAGNOSIS — H903 Sensorineural hearing loss, bilateral: Secondary | ICD-10-CM | POA: Diagnosis not present

## 2019-10-05 DIAGNOSIS — J343 Hypertrophy of nasal turbinates: Secondary | ICD-10-CM | POA: Diagnosis not present

## 2019-10-05 DIAGNOSIS — H9201 Otalgia, right ear: Secondary | ICD-10-CM | POA: Diagnosis not present

## 2019-10-05 DIAGNOSIS — R490 Dysphonia: Secondary | ICD-10-CM | POA: Diagnosis not present

## 2019-10-05 DIAGNOSIS — M26621 Arthralgia of right temporomandibular joint: Secondary | ICD-10-CM | POA: Diagnosis not present

## 2019-10-05 DIAGNOSIS — R1313 Dysphagia, pharyngeal phase: Secondary | ICD-10-CM | POA: Diagnosis not present

## 2019-10-05 DIAGNOSIS — H353221 Exudative age-related macular degeneration, left eye, with active choroidal neovascularization: Secondary | ICD-10-CM | POA: Diagnosis not present

## 2019-10-07 ENCOUNTER — Other Ambulatory Visit: Payer: Self-pay | Admitting: Otolaryngology

## 2019-10-07 DIAGNOSIS — R42 Dizziness and giddiness: Secondary | ICD-10-CM

## 2019-10-10 ENCOUNTER — Other Ambulatory Visit: Payer: Self-pay | Admitting: Otolaryngology

## 2019-10-10 DIAGNOSIS — R1313 Dysphagia, pharyngeal phase: Secondary | ICD-10-CM

## 2019-10-19 ENCOUNTER — Ambulatory Visit
Admission: RE | Admit: 2019-10-19 | Discharge: 2019-10-19 | Disposition: A | Discharge status: Home / Self Care | Payer: Medicare HMO | Source: Ambulatory Visit | Attending: Otolaryngology | Admitting: Otolaryngology

## 2019-10-19 DIAGNOSIS — R1313 Dysphagia, pharyngeal phase: Secondary | ICD-10-CM

## 2019-10-19 DIAGNOSIS — K224 Dyskinesia of esophagus: Secondary | ICD-10-CM | POA: Diagnosis not present

## 2019-11-01 ENCOUNTER — Other Ambulatory Visit: Payer: Self-pay

## 2019-11-01 ENCOUNTER — Ambulatory Visit
Admission: RE | Admit: 2019-11-01 | Discharge: 2019-11-01 | Disposition: A | Discharge status: Home / Self Care | Payer: Medicare HMO | Source: Ambulatory Visit | Attending: Otolaryngology | Admitting: Otolaryngology

## 2019-11-01 DIAGNOSIS — J3489 Other specified disorders of nose and nasal sinuses: Secondary | ICD-10-CM | POA: Diagnosis not present

## 2019-11-01 DIAGNOSIS — R42 Dizziness and giddiness: Secondary | ICD-10-CM

## 2019-11-01 DIAGNOSIS — I6782 Cerebral ischemia: Secondary | ICD-10-CM | POA: Diagnosis not present

## 2019-11-01 MED ORDER — GADOBENATE DIMEGLUMINE 529 MG/ML IV SOLN
15.0000 mL | Freq: Once | INTRAVENOUS | Status: AC | PRN
  Administered 2019-11-01: 15 mL via INTRAVENOUS

## 2019-11-23 DIAGNOSIS — H353211 Exudative age-related macular degeneration, right eye, with active choroidal neovascularization: Secondary | ICD-10-CM | POA: Diagnosis not present

## 2019-11-30 DIAGNOSIS — H353221 Exudative age-related macular degeneration, left eye, with active choroidal neovascularization: Secondary | ICD-10-CM | POA: Diagnosis not present

## 2019-12-07 DIAGNOSIS — M1000 Idiopathic gout, unspecified site: Secondary | ICD-10-CM | POA: Diagnosis not present

## 2019-12-07 DIAGNOSIS — Z0000 Encounter for general adult medical examination without abnormal findings: Secondary | ICD-10-CM | POA: Diagnosis not present

## 2019-12-07 DIAGNOSIS — E782 Mixed hyperlipidemia: Secondary | ICD-10-CM | POA: Diagnosis not present

## 2019-12-07 DIAGNOSIS — E1169 Type 2 diabetes mellitus with other specified complication: Secondary | ICD-10-CM | POA: Diagnosis not present

## 2019-12-07 DIAGNOSIS — I10 Essential (primary) hypertension: Secondary | ICD-10-CM | POA: Diagnosis not present

## 2019-12-07 DIAGNOSIS — I4811 Longstanding persistent atrial fibrillation: Secondary | ICD-10-CM | POA: Diagnosis not present

## 2019-12-14 DIAGNOSIS — H35363 Drusen (degenerative) of macula, bilateral: Secondary | ICD-10-CM | POA: Diagnosis not present

## 2019-12-14 DIAGNOSIS — H353221 Exudative age-related macular degeneration, left eye, with active choroidal neovascularization: Secondary | ICD-10-CM | POA: Diagnosis not present

## 2019-12-14 DIAGNOSIS — H35453 Secondary pigmentary degeneration, bilateral: Secondary | ICD-10-CM | POA: Diagnosis not present

## 2019-12-14 DIAGNOSIS — E119 Type 2 diabetes mellitus without complications: Secondary | ICD-10-CM | POA: Diagnosis not present

## 2019-12-14 DIAGNOSIS — Z961 Presence of intraocular lens: Secondary | ICD-10-CM | POA: Diagnosis not present

## 2019-12-14 DIAGNOSIS — H353231 Exudative age-related macular degeneration, bilateral, with active choroidal neovascularization: Secondary | ICD-10-CM | POA: Diagnosis not present

## 2019-12-14 DIAGNOSIS — H43393 Other vitreous opacities, bilateral: Secondary | ICD-10-CM | POA: Diagnosis not present

## 2019-12-18 DIAGNOSIS — I4811 Longstanding persistent atrial fibrillation: Secondary | ICD-10-CM | POA: Diagnosis not present

## 2019-12-18 DIAGNOSIS — I10 Essential (primary) hypertension: Secondary | ICD-10-CM | POA: Diagnosis not present

## 2019-12-18 DIAGNOSIS — E1100 Type 2 diabetes mellitus with hyperosmolarity without nonketotic hyperglycemic-hyperosmolar coma (NKHHC): Secondary | ICD-10-CM | POA: Diagnosis not present

## 2020-01-10 ENCOUNTER — Other Ambulatory Visit: Payer: Self-pay | Admitting: Cardiovascular Disease

## 2020-01-11 DIAGNOSIS — H353221 Exudative age-related macular degeneration, left eye, with active choroidal neovascularization: Secondary | ICD-10-CM | POA: Diagnosis not present

## 2020-01-15 DIAGNOSIS — I4811 Longstanding persistent atrial fibrillation: Secondary | ICD-10-CM | POA: Diagnosis not present

## 2020-01-15 DIAGNOSIS — I10 Essential (primary) hypertension: Secondary | ICD-10-CM | POA: Diagnosis not present

## 2020-01-15 DIAGNOSIS — E1100 Type 2 diabetes mellitus with hyperosmolarity without nonketotic hyperglycemic-hyperosmolar coma (NKHHC): Secondary | ICD-10-CM | POA: Diagnosis not present

## 2020-01-25 DIAGNOSIS — H353211 Exudative age-related macular degeneration, right eye, with active choroidal neovascularization: Secondary | ICD-10-CM | POA: Diagnosis not present

## 2020-02-08 DIAGNOSIS — E782 Mixed hyperlipidemia: Secondary | ICD-10-CM | POA: Diagnosis not present

## 2020-02-08 DIAGNOSIS — I4811 Longstanding persistent atrial fibrillation: Secondary | ICD-10-CM | POA: Diagnosis not present

## 2020-02-08 DIAGNOSIS — H6001 Abscess of right external ear: Secondary | ICD-10-CM | POA: Diagnosis not present

## 2020-02-08 DIAGNOSIS — I10 Essential (primary) hypertension: Secondary | ICD-10-CM | POA: Diagnosis not present

## 2020-02-08 DIAGNOSIS — E1169 Type 2 diabetes mellitus with other specified complication: Secondary | ICD-10-CM | POA: Diagnosis not present

## 2020-02-08 DIAGNOSIS — R42 Dizziness and giddiness: Secondary | ICD-10-CM | POA: Diagnosis not present

## 2020-02-29 DIAGNOSIS — H353221 Exudative age-related macular degeneration, left eye, with active choroidal neovascularization: Secondary | ICD-10-CM | POA: Diagnosis not present

## 2020-03-06 ENCOUNTER — Telehealth: Payer: Self-pay | Admitting: Podiatry

## 2020-03-06 NOTE — Telephone Encounter (Signed)
Pt daughter called to request a refill for allopurinol for her mother please advise

## 2020-03-06 NOTE — Telephone Encounter (Signed)
Left message informing pt's dtr, Dickinson, pt had not been seen in office in over 1 year and would need to be reevaluated.

## 2020-03-14 ENCOUNTER — Other Ambulatory Visit: Payer: Self-pay

## 2020-03-14 ENCOUNTER — Ambulatory Visit (INDEPENDENT_AMBULATORY_CARE_PROVIDER_SITE_OTHER): Payer: Medicare HMO | Admitting: Podiatry

## 2020-03-14 VITALS — Temp 97.6°F

## 2020-03-14 DIAGNOSIS — M722 Plantar fascial fibromatosis: Secondary | ICD-10-CM

## 2020-03-14 MED ORDER — COLCHICINE 0.6 MG PO TABS: 0.6000 mg | ORAL_TABLET | Freq: Every day | ORAL | 2 refills | Status: DC

## 2020-03-14 MED ORDER — ALLOPURINOL 100 MG PO TABS: 100.0000 mg | ORAL_TABLET | Freq: Every day | ORAL | 2 refills | Status: DC

## 2020-03-16 DIAGNOSIS — I4811 Longstanding persistent atrial fibrillation: Secondary | ICD-10-CM | POA: Diagnosis not present

## 2020-03-16 DIAGNOSIS — I10 Essential (primary) hypertension: Secondary | ICD-10-CM | POA: Diagnosis not present

## 2020-03-16 DIAGNOSIS — E1100 Type 2 diabetes mellitus with hyperosmolarity without nonketotic hyperglycemic-hyperosmolar coma (NKHHC): Secondary | ICD-10-CM | POA: Diagnosis not present

## 2020-03-16 NOTE — Progress Notes (Signed)
   Subjective: 86 y.o. female presenting today with a chief complaint of diffuse sharp, burning bilateral foot pain that began one week ago. She states the pain radiates up to her knees and is mostly at night time. Her daughter states she refuses to take Colchicine for the pain. There are no modifying factors noted. Patient is here for further evaluation and treatment.   Past Medical History:  Diagnosis Date  . DM (diabetes mellitus) ([place])   . HTN (hypertension)   . Tremor      Objective: Physical Exam General: The patient is alert and oriented x3 in no acute distress.  Dermatology: Skin is warm, dry and supple bilateral lower extremities. Negative for open lesions or macerations bilateral.   Vascular: Dorsalis Pedis and Posterior Tibial pulses palpable bilateral.  Capillary fill time is immediate to all digits.  Neurological: Epicritic and protective threshold intact bilateral.   Musculoskeletal: Tenderness to palpation to the plantar aspect of the bilateral heels along the plantar fascia. All other joints range of motion within normal limits bilateral. Strength 5/5 in all groups bilateral.   Assessment: 1. plantar fasciitis bilateral feet 2. Generalized foot pain bilateral 3. H/o chronic gout  Plan of Care:  1. Patient evaluated.  2. Injection of 0.5cc Celestone soluspan injected into the bilateral heels.  3. Refill prescription for Colcrys 0.6 mg provided to patient.  4. Refill prescription for Allopurinol 100 mg daily provided to patient.  5. Return to clinic as needed.    Edrick Kins, DPM [facility]  Dr. Edrick Kins, DPM    [address], [place] [address]                Office [phone]  Fax [phone]

## 2020-03-19 ENCOUNTER — Other Ambulatory Visit: Payer: Self-pay | Admitting: *Deleted

## 2020-03-19 MED ORDER — APIXABAN 5 MG PO TABS: 5.0000 mg | ORAL_TABLET | Freq: Two times a day (BID) | ORAL | 1 refills | Status: DC

## 2020-03-19 NOTE — Telephone Encounter (Signed)
Eliquis 5mg  refill request received. Patient is 86 years old, weight-80.3kg, Crea-1.10 on 09/15/2019, Diagnosis-Afib, and last seen by Dr. Johnsie Cancel on 08/28/2019. Dose is appropriate based on dosing criteria. Will send in refill to requested pharmacy.

## 2020-03-28 DIAGNOSIS — H353211 Exudative age-related macular degeneration, right eye, with active choroidal neovascularization: Secondary | ICD-10-CM | POA: Diagnosis not present

## 2020-04-10 ENCOUNTER — Other Ambulatory Visit: Payer: Self-pay

## 2020-04-10 MED ORDER — COLCHICINE 0.6 MG PO TABS: 0.6000 mg | ORAL_TABLET | Freq: Every day | ORAL | 2 refills | Status: DC

## 2020-05-01 DIAGNOSIS — H353221 Exudative age-related macular degeneration, left eye, with active choroidal neovascularization: Secondary | ICD-10-CM | POA: Diagnosis not present

## 2020-06-05 DIAGNOSIS — H353211 Exudative age-related macular degeneration, right eye, with active choroidal neovascularization: Secondary | ICD-10-CM | POA: Diagnosis not present

## 2020-06-26 DIAGNOSIS — H353221 Exudative age-related macular degeneration, left eye, with active choroidal neovascularization: Secondary | ICD-10-CM | POA: Diagnosis not present

## 2020-07-11 ENCOUNTER — Emergency Department (HOSPITAL_COMMUNITY): Payer: Medicare HMO

## 2020-07-11 ENCOUNTER — Encounter (HOSPITAL_COMMUNITY): Payer: Self-pay | Admitting: Emergency Medicine

## 2020-07-11 ENCOUNTER — Inpatient Hospital Stay (HOSPITAL_COMMUNITY)
Admission: EM | Admit: 2020-07-11 | Discharge: 2020-07-14 | DRG: 872 | Disposition: A | Discharge status: Home / Self Care | Payer: Medicare HMO | Attending: Internal Medicine | Admitting: Internal Medicine

## 2020-07-11 DIAGNOSIS — I129 Hypertensive chronic kidney disease with stage 1 through stage 4 chronic kidney disease, or unspecified chronic kidney disease: Secondary | ICD-10-CM | POA: Diagnosis present

## 2020-07-11 DIAGNOSIS — Z833 Family history of diabetes mellitus: Secondary | ICD-10-CM

## 2020-07-11 DIAGNOSIS — Z7901 Long term (current) use of anticoagulants: Secondary | ICD-10-CM

## 2020-07-11 DIAGNOSIS — E876 Hypokalemia: Secondary | ICD-10-CM | POA: Diagnosis not present

## 2020-07-11 DIAGNOSIS — G4733 Obstructive sleep apnea (adult) (pediatric): Secondary | ICD-10-CM | POA: Diagnosis present

## 2020-07-11 DIAGNOSIS — R0602 Shortness of breath: Secondary | ICD-10-CM | POA: Diagnosis not present

## 2020-07-11 DIAGNOSIS — N390 Urinary tract infection, site not specified: Secondary | ICD-10-CM | POA: Diagnosis not present

## 2020-07-11 DIAGNOSIS — N1831 Chronic kidney disease, stage 3a: Secondary | ICD-10-CM | POA: Diagnosis not present

## 2020-07-11 DIAGNOSIS — N183 Chronic kidney disease, stage 3 unspecified: Secondary | ICD-10-CM | POA: Diagnosis present

## 2020-07-11 DIAGNOSIS — I4891 Unspecified atrial fibrillation: Secondary | ICD-10-CM | POA: Diagnosis not present

## 2020-07-11 DIAGNOSIS — R945 Abnormal results of liver function studies: Secondary | ICD-10-CM | POA: Diagnosis not present

## 2020-07-11 DIAGNOSIS — Z9181 History of falling: Secondary | ICD-10-CM | POA: Diagnosis not present

## 2020-07-11 DIAGNOSIS — E1159 Type 2 diabetes mellitus with other circulatory complications: Secondary | ICD-10-CM

## 2020-07-11 DIAGNOSIS — R531 Weakness: Secondary | ICD-10-CM | POA: Diagnosis not present

## 2020-07-11 DIAGNOSIS — Z79899 Other long term (current) drug therapy: Secondary | ICD-10-CM

## 2020-07-11 DIAGNOSIS — A419 Sepsis, unspecified organism: Secondary | ICD-10-CM | POA: Diagnosis present

## 2020-07-11 DIAGNOSIS — E1122 Type 2 diabetes mellitus with diabetic chronic kidney disease: Secondary | ICD-10-CM | POA: Diagnosis present

## 2020-07-11 DIAGNOSIS — R0902 Hypoxemia: Secondary | ICD-10-CM | POA: Diagnosis not present

## 2020-07-11 DIAGNOSIS — I10 Essential (primary) hypertension: Secondary | ICD-10-CM | POA: Diagnosis not present

## 2020-07-11 DIAGNOSIS — Z20822 Contact with and (suspected) exposure to covid-19: Secondary | ICD-10-CM | POA: Diagnosis present

## 2020-07-11 DIAGNOSIS — A4151 Sepsis due to Escherichia coli [E. coli]: Secondary | ICD-10-CM | POA: Diagnosis not present

## 2020-07-11 DIAGNOSIS — N1832 Chronic kidney disease, stage 3b: Secondary | ICD-10-CM | POA: Diagnosis present

## 2020-07-11 DIAGNOSIS — R000 Tachycardia, unspecified: Secondary | ICD-10-CM | POA: Diagnosis not present

## 2020-07-11 DIAGNOSIS — Z7984 Long term (current) use of oral hypoglycemic drugs: Secondary | ICD-10-CM

## 2020-07-11 DIAGNOSIS — I482 Chronic atrial fibrillation, unspecified: Secondary | ICD-10-CM | POA: Diagnosis present

## 2020-07-11 DIAGNOSIS — R05 Cough: Secondary | ICD-10-CM | POA: Diagnosis not present

## 2020-07-11 DIAGNOSIS — E119 Type 2 diabetes mellitus without complications: Secondary | ICD-10-CM

## 2020-07-11 DIAGNOSIS — R748 Abnormal levels of other serum enzymes: Secondary | ICD-10-CM

## 2020-07-11 DIAGNOSIS — I517 Cardiomegaly: Secondary | ICD-10-CM | POA: Diagnosis not present

## 2020-07-11 HISTORY — DX: Urinary tract infection, site not specified: A41.9

## 2020-07-11 LAB — COMPREHENSIVE METABOLIC PANEL
ALT: 38 U/L (ref 0–44)
AST: 57 U/L — ABNORMAL HIGH (ref 15–41)
Albumin: 2.8 g/dL — ABNORMAL LOW (ref 3.5–5.0)
Alkaline Phosphatase: 46 U/L (ref 38–126)
Anion gap: 13 (ref 5–15)
BUN: 24 mg/dL — ABNORMAL HIGH (ref 8–23)
CO2: 25 mmol/L (ref 22–32)
Calcium: 8.8 mg/dL — ABNORMAL LOW (ref 8.9–10.3)
Chloride: 96 mmol/L — ABNORMAL LOW (ref 98–111)
Creatinine, Ser: 1.39 mg/dL — ABNORMAL HIGH (ref 0.44–1.00)
GFR calc Af Amer: 40 mL/min — ABNORMAL LOW (ref >60)
GFR calc non Af Amer: 34 mL/min — ABNORMAL LOW (ref >60)
Glucose, Bld: 175 mg/dL — ABNORMAL HIGH (ref 70–99)
Potassium: 3.4 mmol/L — ABNORMAL LOW (ref 3.5–5.1)
Sodium: 134 mmol/L — ABNORMAL LOW (ref 135–145)
Total Bilirubin: 0.8 mg/dL (ref 0.3–1.2)
Total Protein: 6.2 g/dL — ABNORMAL LOW (ref 6.5–8.1)

## 2020-07-11 LAB — URINALYSIS, ROUTINE W REFLEX MICROSCOPIC
Bilirubin Urine: NEGATIVE
Glucose, UA: NEGATIVE mg/dL
Ketones, ur: NEGATIVE mg/dL
Nitrite: NEGATIVE
Protein, ur: 30 mg/dL — AB
Specific Gravity, Urine: 1.015 (ref 1.005–1.030)
WBC, UA: >50 WBC/hpf — ABNORMAL HIGH (ref 0–5)
pH: 5 (ref 5.0–8.0)

## 2020-07-11 LAB — CBC WITH DIFFERENTIAL/PLATELET
Abs Immature Granulocytes: 0.05 10*3/uL (ref 0.00–0.07)
Basophils Absolute: 0 10*3/uL (ref 0.0–0.1)
Basophils Relative: 0 %
Eosinophils Absolute: 0 10*3/uL (ref 0.0–0.5)
Eosinophils Relative: 0 %
HCT: 35.5 % — ABNORMAL LOW (ref 36.0–46.0)
Hemoglobin: 11.7 g/dL — ABNORMAL LOW (ref 12.0–15.0)
Immature Granulocytes: 1 %
Lymphocytes Relative: 16 %
Lymphs Abs: 1.7 10*3/uL (ref 0.7–4.0)
MCH: 31.3 pg (ref 26.0–34.0)
MCHC: 33 g/dL (ref 30.0–36.0)
MCV: 94.9 fL (ref 80.0–100.0)
Monocytes Absolute: 1.2 10*3/uL — ABNORMAL HIGH (ref 0.1–1.0)
Monocytes Relative: 11 %
Neutro Abs: 7.9 10*3/uL — ABNORMAL HIGH (ref 1.7–7.7)
Neutrophils Relative %: 72 %
Platelets: 135 10*3/uL — ABNORMAL LOW (ref 150–400)
RBC: 3.74 MIL/uL — ABNORMAL LOW (ref 3.87–5.11)
RDW: 14.1 % (ref 11.5–15.5)
WBC: 10.8 10*3/uL — ABNORMAL HIGH (ref 4.0–10.5)
nRBC: 0 % (ref 0.0–0.2)

## 2020-07-11 LAB — LACTIC ACID, PLASMA
Lactic Acid, Venous: 1.6 mmol/L (ref 0.5–1.9)
Lactic Acid, Venous: 1.9 mmol/L (ref 0.5–1.9)

## 2020-07-11 LAB — SARS CORONAVIRUS 2 BY RT PCR (HOSPITAL ORDER, PERFORMED IN ~~LOC~~ HOSPITAL LAB): SARS Coronavirus 2: NEGATIVE

## 2020-07-11 MED ORDER — SODIUM CHLORIDE 0.9 % IV BOLUS
1000.0000 mL | Freq: Once | INTRAVENOUS | Status: AC
  Administered 2020-07-11: 1000 mL via INTRAVENOUS

## 2020-07-11 MED ORDER — POTASSIUM CHLORIDE IN NACL 20-0.9 MEQ/L-% IV SOLN
INTRAVENOUS | Status: AC
  Filled 2020-07-11 (×3): qty 1000

## 2020-07-11 MED ORDER — ACETAMINOPHEN 325 MG PO TABS
650.0000 mg | ORAL_TABLET | Freq: Four times a day (QID) | ORAL | Status: DC | PRN
  Administered 2020-07-12: 650 mg via ORAL
  Filled 2020-07-11: qty 2

## 2020-07-11 MED ORDER — SODIUM CHLORIDE 0.9 % IV SOLN
1.0000 g | Freq: Once | INTRAVENOUS | Status: AC
  Administered 2020-07-11: 1 g via INTRAVENOUS
  Filled 2020-07-11: qty 10

## 2020-07-11 MED ORDER — ONDANSETRON HCL 4 MG PO TABS: 4.0000 mg | ORAL_TABLET | Freq: Four times a day (QID) | ORAL | Status: DC | PRN

## 2020-07-11 MED ORDER — DILTIAZEM HCL ER 60 MG PO CP12
60.0000 mg | ORAL_CAPSULE | Freq: Once | ORAL | Status: AC
  Administered 2020-07-12: 60 mg via ORAL
  Filled 2020-07-11: qty 1

## 2020-07-11 MED ORDER — SODIUM CHLORIDE 0.9 % IV BOLUS
500.0000 mL | Freq: Once | INTRAVENOUS | Status: AC
  Administered 2020-07-11: 500 mL via INTRAVENOUS

## 2020-07-11 MED ORDER — ACETAMINOPHEN 650 MG RE SUPP: 650.0000 mg | Freq: Four times a day (QID) | RECTAL | Status: DC | PRN

## 2020-07-11 MED ORDER — ACETAMINOPHEN 325 MG PO TABS
650.0000 mg | ORAL_TABLET | Freq: Four times a day (QID) | ORAL | Status: DC | PRN
  Administered 2020-07-11: 650 mg via ORAL
  Filled 2020-07-11: qty 2

## 2020-07-11 MED ORDER — ONDANSETRON HCL 4 MG/2ML IJ SOLN: 4.0000 mg | Freq: Four times a day (QID) | INTRAMUSCULAR | Status: DC | PRN

## 2020-07-11 MED ORDER — ENOXAPARIN SODIUM 40 MG/0.4ML ~~LOC~~ SOLN: 40.0000 mg | SUBCUTANEOUS | Status: DC

## 2020-07-11 MED ORDER — SODIUM CHLORIDE 0.9 % IV BOLUS (SEPSIS): 250.0000 mL | Freq: Once | INTRAVENOUS | Status: DC

## 2020-07-11 MED ORDER — SODIUM CHLORIDE 0.9 % IV SOLN
1.0000 g | INTRAVENOUS | Status: DC
  Administered 2020-07-12 – 2020-07-13 (×2): 1 g via INTRAVENOUS
  Filled 2020-07-11 (×2): qty 10
  Filled 2020-07-11: qty 1

## 2020-07-11 MED ORDER — INSULIN ASPART 100 UNIT/ML ~~LOC~~ SOLN
0.0000 [IU] | Freq: Three times a day (TID) | SUBCUTANEOUS | Status: DC
  Administered 2020-07-12 (×2): 1 [IU] via SUBCUTANEOUS
  Administered 2020-07-13: 2 [IU] via SUBCUTANEOUS

## 2020-07-11 NOTE — ED Triage Notes (Signed)
Pt arrives via [facility] from home with c/o sob. Pt woke up with sob this morning, ems reports RUL decreased lung sounds, O2 sats 92-94% on ra with EMS. EMS BP 102/76, HR 80-120 w hx of afib, reports she has not been taking her meds for the past few days. Dry cough present in triage. Fall did report a fall yesterday, is currently on eliquis. A/ox4.

## 2020-07-11 NOTE — H&P (Signed)
History and Physical    Anne Roach:[id number] DOB: 1933-07-21 DOA: 07/11/2020  PCP: Lucianne Lei, MD   Patient coming from: Home.  I have personally briefly reviewed patient's old medical records in [place]  Chief Complaint: SOB.   HPI: Anne Roach is a 86 y.o. female with medical history significant of type 2 diabetes mellitus, essential hypertension, essential tremor, chronic atrial fibrillation, stage IIIa CKD who is brought to the emergency department via EMS after her daughter called 911 due to her mother waking up dyspneic seems this morning.  She had a fever in the emergency department at 103.17F.  She has also been having decreased appetite, fatigue, rigors and night sweats.  She has had mild dysuria.  Review of systems also reveals dry cough for several days and a history of a fall without apparent injury yesterday.  No history of head trauma or LOC during the fall.  She denies wheezing, hemoptysis, chest pain, palpitations, PND, orthopnea or pitting edema of the lower extremities.  She has been feeling nauseous, but denies abdominal pain, emesis, diarrhea, constipation, melena or hematochezia.  She denies polyuria, polydipsia, polyphagia or blurred vision.  ED Course: Initial vital signs temperature 100.0 F, pulse 108, respiration 18, BP 123/63 mmHg and O2 sat 97% on room air.  The patient was given 1500 mL of NS bolus and 1 g of ceftriaxone IVPB.  Urinalysis was cloudy, with small hemoglobinuria, proteinuria of 30 mg/dL and moderate leukocyte esterase.  More than 50 WBC per hpf and many bacteria on microscopic examination.  CBC showed a white count of 10.9, hemoglobin 11.7 g/dL and platelets 135.  Lactic acid was normal twice.  SARS coronavirus 2 PCR was negative.  CMP shows sodium of 134, potassium 3.4, chloride 96 and CO2 25 mmol/L.  Glucose 175, BUN 24 and creatinine 1.39 mg/dL.  Total protein 6.2 and albumin 2.8 g/dL.  AST was 57, but ALT, alk phos and total  bilirubin were normal.  No active cardiopulmonary disease was seen on chest radiograph.  Review of Systems: As per HPI otherwise all other systems reviewed and are negative.  Past Medical History:  Diagnosis Date  . DM (diabetes mellitus) ([place])   . HTN (hypertension)   . Tremor     Past Surgical History:  Procedure Laterality Date  . APPENDECTOMY    . CARPAL TUNNEL RELEASE    . CATARACT EXTRACTION Bilateral   . HERNIA REPAIR      Social History  reports that she has never smoked. She has never used smokeless tobacco. She reports that she does not drink alcohol and does not use drugs.  No Known Allergies  Family History  Problem Relation Age of Onset  . Diabetes Father   . Asthma Brother    Prior to Admission medications   Medication Sig Start Date End Date Taking? Authorizing Provider  allopurinol (ZYLOPRIM) 100 MG tablet Take 1 tablet (100 mg total) by mouth daily. 03/14/20  Yes Edrick Kins, DPM  apixaban (ELIQUIS) 5 MG TABS tablet Take 1 tablet (5 mg total) by mouth 2 (two) times daily. 03/19/20  Yes Josue Hector, MD  cholecalciferol (VITAMIN D3) 25 MCG (1000 UNIT) tablet Take 1,000 Units by mouth daily.   Yes [provider]  colchicine 0.6 MG tablet Take 1 tablet (0.6 mg total) by mouth daily. 04/10/20  Yes Edrick Kins, DPM  diltiazem (CARDIZEM CD) 240 MG 24 hr capsule TAKE 1 CAPSULE(240 MG) BY MOUTH DAILY Patient  taking differently: Take 240 mg by mouth daily.  01/10/20  Yes Josue Hector, MD  hydrochlorothiazide (MICROZIDE) 12.5 MG capsule Take 1 capsule (12.5 mg total) by mouth daily. 08/18/19  Yes Josue Hector, MD  metFORMIN (GLUCOPHAGE-XR) 750 MG 24 hr tablet Take 750 mg by mouth daily after supper. 05/14/15  Yes [provider]  Multiple Vitamins-Minerals (ICAPS AREDS 2 PO) Take 1 tablet by mouth daily.   Yes [provider]   Physical Exam: Vitals:   07/11/20 2015 07/11/20 2100 07/11/20 2145 07/11/20 2230  BP: 119/77 129/78  130/78 117/68  Pulse: (!) 103 (!) 108 98 73  Resp: (!) 27 (!) 24 (!) 21 (!) 22  Temp:      TempSrc:      SpO2: 93% 90% 93% 94%   Constitutional: Looks acutely ill. Eyes: PERRL, lids and conjunctivae mildly injected. ENMT: Mucous membranes are dry.  Posterior pharynx clear of any exudate or lesions. Neck: normal, supple, no masses, no thyromegaly Respiratory: Decreased breath sounds on bases, otherwise clear to auscultation bilaterally, no wheezing, no crackles. Normal respiratory effort. No accessory muscle use.  Cardiovascular: Tachycardic in the 100s and 110s with an irregularly irregular rhythm, no murmurs / rubs / gallops. No extremity edema. 2+ pedal pulses. No carotid bruits.  Abdomen: Nondistended.  BS positive.  Soft, positive suprapubic tenderness, mild positive right CVA tenderness, no guarding or rebound, no masses palpated. No hepatosplenomegaly. Musculoskeletal: Mild generalized weakness.  No clubbing / cyanosis. Good ROM, no contractures. Normal muscle tone.  Skin: Some small areas of ecchymosis on extremities. Neurologic: CN 2-12 grossly intact. Sensation intact, DTR normal. Strength 5/5 in all 4.  Psychiatric: Normal judgment and insight. Alert and oriented x 3. Normal mood.   Labs on Admission: I have personally reviewed following labs and imaging studies  CBC: Recent Labs  Lab 07/11/20 0903  WBC 10.8*  NEUTROABS 7.9*  HGB 11.7*  HCT 35.5*  MCV 94.9  PLT 135*    Basic Metabolic Panel: Recent Labs  Lab 07/11/20 0903  NA 134*  K 3.4*  CL 96*  CO2 25  GLUCOSE 175*  BUN 24*  CREATININE 1.39*  CALCIUM 8.8*    GFR: CrCl cannot be calculated (Unknown ideal weight.).  Liver Function Tests: Recent Labs  Lab 07/11/20 0903  AST 57*  ALT 38  ALKPHOS 46  BILITOT 0.8  PROT 6.2*  ALBUMIN 2.8*    Urine analysis:    Component Value Date/Time   COLORURINE YELLOW 07/11/2020 2035   APPEARANCEUR CLOUDY (A) 07/11/2020 2035   LABSPEC 1.015 07/11/2020 2035    PHURINE 5.0 07/11/2020 2035   GLUCOSEU NEGATIVE 07/11/2020 2035   HGBUR SMALL (A) 07/11/2020 2035   BILIRUBINUR NEGATIVE 07/11/2020 2035   KETONESUR NEGATIVE 07/11/2020 2035   PROTEINUR 30 (A) 07/11/2020 2035   UROBILINOGEN 0.2 06/12/2011 1201   NITRITE NEGATIVE 07/11/2020 2035   LEUKOCYTESUR MODERATE (A) 07/11/2020 2035    Radiological Exams on Admission: DG Chest Portable 1 View  Result Date: 07/11/2020 CLINICAL DATA:  Shortness of breath, cough. EXAM: PORTABLE CHEST 1 VIEW COMPARISON:  September 15, 2019. FINDINGS: Stable cardiomegaly. No pneumothorax or pleural effusion is noted. Lungs are clear. Bony thorax is unremarkable. IMPRESSION: No active disease. Aortic Atherosclerosis (ICD10-I70.0). Electronically Signed   By: Marijo Conception M.D.   On: 07/11/2020 09:25   01/03/2019 ECHO  IMPRESSIONS  1. The left ventricle has normal systolic function with an ejection  fraction of 60-65%. The cavity size was normal.  There is mildly increased  left ventricular wall thickness. Left ventricular diastolic Doppler  parameters are indeterminate secondary to  atrial fibrillation.  2. The right ventricle has normal systolic function. The cavity was  normal. There is no increase in right ventricular wall thickness.  3. Left atrial size was mildly dilated.  4. Small pericardial effusion.  5. The pericardial effusion is posterior.  6. The mitral valve is myxomatous. Mild calcification of the mitral valve  leaflet. Mitral valve regurgitation is mild to moderate by color flow  Doppler.  7. The tricuspid valve is normal in structure.  8. The aortic valve is tricuspid Mild sclerosis of the aortic valve.  Aortic valve regurgitation is trivial by color flow Doppler.  9. Right atrial pressure is estimated at 10 mmHg.  10. The interatrial septum was not well visualized.  11. When compared to the prior study: Compared to a prior study in 03/2017,  there are no significant changes.   EKG:  Independently reviewed. Vent. rate 80 BPM PR interval * ms QRS duration 100 ms QT/QTc 401/463 ms P-R-T axes * -15 11 Atrial fibrillation Borderline left axis deviation Borderline T abnormalities, diffuse leads  Assessment/Plan Principal Problem:   Sepsis secondary to UTI POA ([place]) Admit to inpatient/telemetry. Continue with gentle/time-limited IV hydration. Continue ceftriaxone 1 g IVPB every 24 hours for Follow-up blood cultures and sensitivity. Follow-up urine culture and sensitivity.  Active Problems:   Hypokalemia Replacing through IVF. Check magnesium level. Check phosphorus level. Follow-up potassium level.    Chronic atrial fibrillation (HCC) CHA?DS?-VASc Score of at least 6. Continue apixaban 5 mg p.o. twice daily. Continue Cardizem 240 mg p.o. daily.    Type 2 diabetes mellitus (HCC) Carbohydrate modified diet. Continue Metformin 750 mg p.o. after supper. CBG monitoring with RI SS.    HTN (hypertension) Continue Cardizem 240 mg p.o. daily. Monitor blood pressure and heart rate.    Chronic kidney disease (CKD), stage III (moderate) Worsening effusion due to volume depletion. Received 1500 mL of NS bolus. Continue gentle IV hydration.   DVT prophylaxis: Apixaban. Code Status:   Full code. Family Communication:  Her daughter was in her room, translated and provided information Disposition Plan:   Patient is from:  Home.  Anticipated DC to:  Home.  Anticipated DC date:  07/13/2020.  Anticipated DC barriers: Clinical improvement. Consults called: Admission status:  Inpatient/telemetry.  Severity of Illness:  Reubin Milan MD [facility]  How to contact the [facility] Attending or Consulting provider [place] or covering provider during after hours [place], for this patient?   1. Check the care team in [facility] and look for a) attending/consulting [facility] provider listed and b) the [facility] team listed 2. Log into [email or web address] and use [place]'s universal  password to access. If you do not have the password, please contact the hospital operator. 3. Locate the [facility] provider you are looking for under [facility] and page to a number that you can be directly reached. 4. If you still have difficulty reaching the provider, please page the [facility] (Director on Call) for the Hospitalists listed on amion for assistance.  07/11/2020, 11:08 PM   This document was prepared using Dragon voice recognition software and may contain some unintended transcription errors.

## 2020-07-11 NOTE — ED Notes (Signed)
Hospitalist place pt on 1L O2 via [place] for comfort 97% SPO2.

## 2020-07-11 NOTE — ED Provider Notes (Addendum)
[facility] EMERGENCY DEPARTMENT Provider Note   CSN: [id number] Arrival date & time: 07/11/20  0848     History Chief Complaint  Patient presents with  . Shortness of Breath    Anne Roach is a 86 y.o. female.  The history is provided by the patient. No language interpreter was used.  Shortness of Breath Severity:  Moderate Onset quality:  Gradual Timing:  Constant Progression:  Worsening Chronicity:  New Relieved by:  Nothing Worsened by:  Nothing Ineffective treatments:  None tried   Pt complains of feelsing short of breath.  Pt's daughter reports pt has not been eating and drinking and is weak  Pt has a fever today     Past Medical History:  Diagnosis Date  . DM (diabetes mellitus) ([place])   . HTN (hypertension)   . Tremor     Patient Active Problem List   Diagnosis Date Noted  . Hypervolemia   . Campylobacter gastrointestinal tract infection 01/03/2019  . Chronic kidney disease (CKD), stage III (moderate) 01/02/2019  . AKI (acute kidney injury) ([place]) 01/02/2019  . Hypovolemia with active loss of fluid 01/02/2019  . Hypokalemia 01/02/2019  . Hypomagnesemia 01/02/2019  . Hypoalbuminemia 01/02/2019  . Bandemia without diagnosis of specific infection 01/02/2019  . Hypotension 01/02/2019  . Fatigue   . Sepsis ([place])   . Bacteria in urine 01/01/2019  . Atrial fibrillation with RVR ([place]) 03/20/2017  . Myogenic ptosis of bilateral eyelids 06/24/2016  . HTN (hypertension)   . Tremor   . Exudative age-related macular degeneration of right eye with active choroidal neovascularization ([place]) 08/29/2014  . Pseudophakia of both eyes 08/29/2014  . OSA (obstructive sleep apnea) 06/29/2012  . DM (diabetes mellitus) ([place])   . Incidental lung nodule 03/10/2009  . Other diseases of lung, not elsewhere classified 03/10/2009    Past Surgical History:  Procedure Laterality Date  . APPENDECTOMY    . CARPAL TUNNEL RELEASE    . CATARACT EXTRACTION  Bilateral   . HERNIA REPAIR       OB History   No obstetric history on file.     Family History  Problem Relation Age of Onset  . Diabetes Father   . Asthma Brother     Social History   Tobacco Use  . Smoking status: Never Smoker  . Smokeless tobacco: Never Used  Substance Use Topics  . Alcohol use: No    Alcohol/week: 0.0 standard drinks  . Drug use: No    Home Medications Prior to Admission medications   Medication Sig Start Date End Date Taking? Authorizing Provider  allopurinol (ZYLOPRIM) 100 MG tablet Take 1 tablet (100 mg total) by mouth daily. 03/14/20   Edrick Kins, DPM  apixaban (ELIQUIS) 5 MG TABS tablet Take 1 tablet (5 mg total) by mouth 2 (two) times daily. 03/19/20   Josue Hector, MD  colchicine 0.6 MG tablet Take 1 tablet (0.6 mg total) by mouth daily. 04/10/20   Edrick Kins, DPM  diltiazem (CARDIZEM CD) 240 MG 24 hr capsule TAKE 1 CAPSULE(240 MG) BY MOUTH DAILY 01/10/20   Josue Hector, MD  hydrochlorothiazide (MICROZIDE) 12.5 MG capsule Take 1 capsule (12.5 mg total) by mouth daily. 08/18/19   Josue Hector, MD  metFORMIN (GLUCOPHAGE-XR) 750 MG 24 hr tablet Take 750 mg by mouth daily after supper. 05/14/15   [provider]  MITIGARE 0.6 MG CAPS Take 1 capsule by mouth daily. 05/14/20   [provider]  Multiple Vitamins-Minerals (ICAPS AREDS 2 PO) Take 1 tablet by mouth daily.    [provider]    Allergies    Patient has no known allergies.  Review of Systems   Review of Systems  Respiratory: Positive for shortness of breath.   All other systems reviewed and are negative.   Physical Exam Updated Vital Signs BP 130/78   Pulse 98   Temp (!) 103.1 F (39.5 C) (Oral)   Resp (!) 21   SpO2 93%   Physical Exam Vitals and nursing note reviewed.  Constitutional:      Appearance: She is well-developed.  HENT:     Head: Normocephalic.  Cardiovascular:     Rate and Rhythm: Normal rate.  Pulmonary:     Effort:  Pulmonary effort is normal.     Breath sounds: No decreased breath sounds.  Abdominal:     General: There is no distension.  Musculoskeletal:        General: Normal range of motion.     Cervical back: Normal range of motion.  Skin:    General: Skin is warm.  Neurological:     Mental Status: She is alert and oriented to person, place, and time.  Psychiatric:        Mood and Affect: Mood normal.     ED Results / Procedures / Treatments   Labs (all labs ordered are listed, but only abnormal results are displayed) Labs Reviewed  COMPREHENSIVE METABOLIC PANEL - Abnormal; Notable for the following components:      Result Value   Sodium 134 (*)    Potassium 3.4 (*)    Chloride 96 (*)    Glucose, Bld 175 (*)    BUN 24 (*)    Creatinine, Ser 1.39 (*)    Calcium 8.8 (*)    Total Protein 6.2 (*)    Albumin 2.8 (*)    AST 57 (*)    GFR calc non Af Amer 34 (*)    GFR calc Af Amer 40 (*)    All other components within normal limits  CBC WITH DIFFERENTIAL/PLATELET - Abnormal; Notable for the following components:   WBC 10.8 (*)    RBC 3.74 (*)    Hemoglobin 11.7 (*)    HCT 35.5 (*)    Platelets 135 (*)    Neutro Abs 7.9 (*)    Monocytes Absolute 1.2 (*)    All other components within normal limits  URINALYSIS, ROUTINE W REFLEX MICROSCOPIC - Abnormal; Notable for the following components:   APPearance CLOUDY (*)    Hgb urine dipstick SMALL (*)    Protein, ur 30 (*)    Leukocytes,Ua MODERATE (*)    WBC, UA >50 (*)    Bacteria, UA MANY (*)    All other components within normal limits  SARS CORONAVIRUS 2 BY RT PCR (HOSPITAL ORDER, [facility])  URINE CULTURE  LACTIC ACID, PLASMA  LACTIC ACID, PLASMA    EKG EKG Interpretation  Date/Time:  Wednesday July 11 2020 08:56:36 EDT Ventricular Rate:  112 PR Interval:    QRS Duration: 86 QT Interval:  338 QTC Calculation: 461 R Axis:   13 Text Interpretation: Atrial fibrillation with rapid  ventricular response Septal infarct , age undetermined Abnormal ECG Confirmed by Pattricia Boss [phone]) on 07/11/2020 3:08:59 PM   Radiology DG Chest Portable 1 View  Result Date: 07/11/2020 CLINICAL DATA:  Shortness of breath, cough. EXAM: PORTABLE CHEST 1 VIEW COMPARISON:  September 15, 2019. FINDINGS: Stable cardiomegaly. No pneumothorax or pleural effusion is noted. Lungs are clear. Bony thorax is unremarkable. IMPRESSION: No active disease. Aortic Atherosclerosis (ICD10-I70.0). Electronically Signed   By: Marijo Conception M.D.   On: 07/11/2020 09:25    Procedures Procedures (including critical care time)  Medications Ordered in ED Medications  acetaminophen (TYLENOL) tablet 650 mg (650 mg Oral Given 07/11/20 1823)  cefTRIAXone (ROCEPHIN) 1 g in sodium chloride 0.9 % 100 mL IVPB (has no administration in time range)  sodium chloride 0.9 % bolus 500 mL (0 mLs Intravenous Stopped 07/11/20 2107)    ED Course  I have reviewed the triage vital signs and the nursing notes.  Pertinent labs & imaging results that were available during my care of the patient were reviewed by me and considered in my medical decision making (see chart for details).  Clinical Course as of Jul 11 2201  Wed Jul 11, 2020  1617 Total Protein(!): 6.2 [LS]    Clinical Course User Index [LS] Sidney Ace   MDM Rules/Calculators/A&P                         CRITICAL CARE Performed by: Alyse Low Total critical care time: 19minutes Critical care time was exclusive of separately billable procedures and treating other patients. Critical care was necessary to treat or prevent imminent or life-threatening deterioration. Critical care was time spent personally by me on the following activities: development of treatment plan with patient and/or surrogate as well as nursing, discussions with consultants, evaluation of patient's response to treatment, examination of patient, obtaining history from patient or  surrogate, ordering and performing treatments and interventions, ordering and review of laboratory studies, ordering and review of radiographic studies, pulse oximetry and re-evaluation of patient's condition. MDM:  Ua shows wbc's.  covid is negative.  Rocephin 1 gram Iv.  Pt given Iv fluid bolus.  I spoke with Hospitalist who will admit  Final Clinical Impression(s) / ED Diagnoses Final diagnoses:  Sepsis, due to unspecified organism, unspecified whether acute organ dysfunction present [facility])    Rx / DC Orders ED Discharge Orders    None       Sidney Ace 07/11/20 2310    Truddie Hidden, MD 07/11/20 2313    Fransico Meadow, [place] 08/11/20 1627

## 2020-07-12 ENCOUNTER — Other Ambulatory Visit: Payer: Self-pay

## 2020-07-12 DIAGNOSIS — A419 Sepsis, unspecified organism: Secondary | ICD-10-CM

## 2020-07-12 DIAGNOSIS — N390 Urinary tract infection, site not specified: Secondary | ICD-10-CM

## 2020-07-12 LAB — COMPREHENSIVE METABOLIC PANEL
ALT: 63 U/L — ABNORMAL HIGH (ref 0–44)
AST: 82 U/L — ABNORMAL HIGH (ref 15–41)
Albumin: 2.2 g/dL — ABNORMAL LOW (ref 3.5–5.0)
Alkaline Phosphatase: 45 U/L (ref 38–126)
Anion gap: 10 (ref 5–15)
BUN: 21 mg/dL (ref 8–23)
CO2: 25 mmol/L (ref 22–32)
Calcium: 8.2 mg/dL — ABNORMAL LOW (ref 8.9–10.3)
Chloride: 102 mmol/L (ref 98–111)
Creatinine, Ser: 1.05 mg/dL — ABNORMAL HIGH (ref 0.44–1.00)
GFR calc Af Amer: 56 mL/min — ABNORMAL LOW (ref >60)
GFR calc non Af Amer: 48 mL/min — ABNORMAL LOW (ref >60)
Glucose, Bld: 156 mg/dL — ABNORMAL HIGH (ref 70–99)
Potassium: 3.4 mmol/L — ABNORMAL LOW (ref 3.5–5.1)
Sodium: 137 mmol/L (ref 135–145)
Total Bilirubin: 0.9 mg/dL (ref 0.3–1.2)
Total Protein: 5.4 g/dL — ABNORMAL LOW (ref 6.5–8.1)

## 2020-07-12 LAB — HEMOGLOBIN A1C
Hgb A1c MFr Bld: 6.3 % — ABNORMAL HIGH (ref 4.8–5.6)
Mean Plasma Glucose: 134.11 mg/dL

## 2020-07-12 LAB — CBG MONITORING, ED: Glucose-Capillary: 107 mg/dL — ABNORMAL HIGH (ref 70–99)

## 2020-07-12 LAB — MAGNESIUM: Magnesium: 1.4 mg/dL — ABNORMAL LOW (ref 1.7–2.4)

## 2020-07-12 LAB — GLUCOSE, CAPILLARY
Glucose-Capillary: 131 mg/dL — ABNORMAL HIGH (ref 70–99)
Glucose-Capillary: 144 mg/dL — ABNORMAL HIGH (ref 70–99)
Glucose-Capillary: 164 mg/dL — ABNORMAL HIGH (ref 70–99)

## 2020-07-12 LAB — PHOSPHORUS: Phosphorus: 2.7 mg/dL (ref 2.5–4.6)

## 2020-07-12 MED ORDER — VITAMIN D 25 MCG (1000 UNIT) PO TABS
1000.0000 [IU] | ORAL_TABLET | Freq: Every day | ORAL | Status: DC
  Administered 2020-07-12 – 2020-07-14 (×3): 1000 [IU] via ORAL
  Filled 2020-07-12 (×3): qty 1

## 2020-07-12 MED ORDER — DILTIAZEM HCL ER COATED BEADS 120 MG PO CP24
240.0000 mg | ORAL_CAPSULE | Freq: Every day | ORAL | Status: DC
  Administered 2020-07-12 – 2020-07-14 (×3): 240 mg via ORAL
  Filled 2020-07-12 (×2): qty 2
  Filled 2020-07-12: qty 1

## 2020-07-12 MED ORDER — APIXABAN 5 MG PO TABS
5.0000 mg | ORAL_TABLET | Freq: Two times a day (BID) | ORAL | Status: DC
  Administered 2020-07-12 – 2020-07-14 (×6): 5 mg via ORAL
  Filled 2020-07-12 (×6): qty 1

## 2020-07-12 MED ORDER — ALLOPURINOL 100 MG PO TABS
100.0000 mg | ORAL_TABLET | Freq: Every day | ORAL | Status: DC
  Administered 2020-07-12 – 2020-07-14 (×3): 100 mg via ORAL
  Filled 2020-07-12 (×3): qty 1

## 2020-07-12 MED ORDER — KETOROLAC TROMETHAMINE 15 MG/ML IJ SOLN
15.0000 mg | Freq: Once | INTRAMUSCULAR | Status: AC
  Administered 2020-07-12: 15 mg via INTRAVENOUS
  Filled 2020-07-12: qty 1

## 2020-07-12 MED ORDER — MAGNESIUM SULFATE 2 GM/50ML IV SOLN
2.0000 g | Freq: Once | INTRAVENOUS | Status: AC
  Administered 2020-07-12: 2 g via INTRAVENOUS
  Filled 2020-07-12: qty 50

## 2020-07-12 MED ORDER — SODIUM CHLORIDE 0.9 % IV BOLUS
1000.0000 mL | Freq: Once | INTRAVENOUS | Status: AC
  Administered 2020-07-12: 1000 mL via INTRAVENOUS

## 2020-07-12 MED ORDER — METFORMIN HCL ER 750 MG PO TB24
750.0000 mg | ORAL_TABLET | Freq: Every day | ORAL | Status: DC
  Filled 2020-07-12: qty 1

## 2020-07-12 NOTE — ED Notes (Signed)
Pt provided hygiene care, post [place] of bedpan for elimination, pt repositioned in bed in stable condition with call bell in reach.

## 2020-07-12 NOTE — ED Notes (Signed)
Ordered breakfast 

## 2020-07-12 NOTE — ED Notes (Signed)
Spoke with Olevia Bowens, MD awaiting orders.

## 2020-07-12 NOTE — ED Notes (Signed)
Pt got self out of bed to pee in bedpan pt placed on floor. This RN has been providing pt with bedpan for elimination with use of pt call bell with prior verbalized understanding of use. Pt call bell noted to be in bed within reach of pt when pt found standing with steady gait, but labored breathing SPO2 96% RA at this time, 1L O2 replaced for comfort. Pt provided hygiene care at this time and repositioned in bed, pt cardiac rhythm remains in Afib, provider paged at this time.

## 2020-07-12 NOTE — Progress Notes (Signed)
Progress Note    VARA MAIRENA  EVO:[id number] DOB: 1933/11/15  DOA: 07/11/2020 PCP: Lucianne Lei, MD    Brief Narrative:     Medical records reviewed and are as summarized below:  Pierce Crane is an 86 y.o. female with medical history significant of type 2 diabetes mellitus, essential hypertension, essential tremor, chronic atrial fibrillation, stage IIIa CKD who is brought to the emergency department via EMS after her daughter called 911 due to her mother waking up dyspneic seems this morning.  She had a fever in the emergency department at 103.11F.  She has also been having decreased appetite, fatigue, rigors and night sweats.  She has had mild dysuria.  Assessment/Plan:   Principal Problem:   Sepsis secondary to UTI [facility]) Active Problems:   Type 2 diabetes mellitus (HCC)   HTN (hypertension)   Chronic kidney disease (CKD), stage III (moderate)   Hypokalemia   Chronic atrial fibrillation (HCC)   Sepsis secondary to UTI POA (HCC) Continue ceftriaxone 1 g IVPB every 24 hours Follow-up blood cultures and sensitivity (not done prior to IV abx) Follow-up urine culture and sensitivity.    Hypokalemia/hypomagnesemia Replacing through IVF. -replace MG IV    Chronic atrial fibrillation (HCC) [place] vasc 2 Score of at least 6. Continue apixaban 5 mg p.o. twice daily. Continue Cardizem 240 mg p.o. daily.    Type 2 diabetes mellitus (HCC) Carbohydrate modified diet. SSI    HTN (hypertension) Continue Cardizem 240 mg p.o. daily. Monitor blood pressure and heart rate.    Chronic kidney disease (CKD), stage IIIa Improved with IVF     Family Communication/Anticipated D/C date and plan/Code Status   DVT prophylaxis: Lovenox ordered. Code Status: Full Code.  Disposition Plan: Status is: Inpatient  Remains inpatient appropriate because:Inpatient level of care appropriate due to severity of illness   Dispo: The patient is from: Home               Anticipated d/c is to: Home              Anticipated d/c date is: 2 days              Patient currently is not medically stable to d/c.         Medical Consultants:    None.     Subjective:   sleeping  Objective:    Vitals:   07/12/20 0945 07/12/20 1000 07/12/20 1015 07/12/20 1237  BP: 122/74 114/64 124/65 138/81  Pulse: (!) 107 99 100 97  Resp: (!) 21 (!) 24 20 (!) 21  Temp:    99.7 F (37.6 C)  TempSrc:    Oral  SpO2: 96% 97% 96% 95%    Intake/Output Summary (Last 24 hours) at 07/12/2020 1310 Last data filed at 07/12/2020 0249 Gross per 24 hour  Intake 2600 ml  Output --  Net 2600 ml   There were no vitals filed for this visit.  Exam:  General: Appearance:     Ill appearing female     Lungs:     respirations unlabored  Heart:    Tachycardic. Irregularly irregular rhythm. No murmurs, rubs, or gallops.   MS:   All extremities are intact.        Data Reviewed:   I have personally reviewed following labs and imaging studies:  Labs: Labs show the following:   Basic Metabolic Panel: Recent Labs  Lab 07/11/20 0903 07/12/20 0445  NA 134* 137  K 3.4* 3.4*  CL 96* 102  CO2 25 25  GLUCOSE 175* 156*  BUN 24* 21  CREATININE 1.39* 1.05*  CALCIUM 8.8* 8.2*  MG  --  1.4*  PHOS  --  2.7   GFR CrCl cannot be calculated (Unknown ideal weight.). Liver Function Tests: Recent Labs  Lab 07/11/20 0903 07/12/20 0445  AST 57* 82*  ALT 38 63*  ALKPHOS 46 45  BILITOT 0.8 0.9  PROT 6.2* 5.4*  ALBUMIN 2.8* 2.2*   No results for input(s): LIPASE, AMYLASE in the last 168 hours. No results for input(s): AMMONIA in the last 168 hours. Coagulation profile No results for input(s): INR, PROTIME in the last 168 hours.  CBC: Recent Labs  Lab 07/11/20 0903  WBC 10.8*  NEUTROABS 7.9*  HGB 11.7*  HCT 35.5*  MCV 94.9  PLT 135*   Cardiac Enzymes: No results for input(s): CKTOTAL, CKMB, CKMBINDEX, TROPONINI in the last 168 hours. BNP (last 3  results) No results for input(s): PROBNP in the last 8760 hours. CBG: Recent Labs  Lab 07/12/20 0813  GLUCAP 107*   D-Dimer: No results for input(s): DDIMER in the last 72 hours. Hgb A1c: Recent Labs    07/12/20 0445  HGBA1C 6.3*   Lipid Profile: No results for input(s): CHOL, HDL, LDLCALC, TRIG, CHOLHDL, LDLDIRECT in the last 72 hours. Thyroid function studies: No results for input(s): TSH, T4TOTAL, T3FREE, THYROIDAB in the last 72 hours.  Invalid input(s): FREET3 Anemia work up: No results for input(s): VITAMINB12, FOLATE, FERRITIN, TIBC, IRON, RETICCTPCT in the last 72 hours. Sepsis Labs: Recent Labs  Lab 07/11/20 0903 07/11/20 1659  WBC 10.8*  --   LATICACIDVEN 1.6 1.9    Microbiology Recent Results (from the past 240 hour(s))  SARS Coronavirus 2 by RT PCR (hospital order, performed in [facility] hospital lab) Nasopharyngeal Nasopharyngeal Swab     Status: None   Collection Time: 07/11/20 11:38 AM   Specimen: Nasopharyngeal Swab  Result Value Ref Range Status   SARS Coronavirus 2 NEGATIVE NEGATIVE Final    Comment: (NOTE) SARS-CoV-2 target nucleic acids are NOT DETECTED.  The SARS-CoV-2 RNA is generally detectable in upper and lower respiratory specimens during the acute phase of infection. The lowest concentration of SARS-CoV-2 viral copies this assay can detect is 250 copies / mL. A negative result does not preclude SARS-CoV-2 infection and should not be used as the sole basis for treatment or other patient management decisions.  A negative result may occur with improper specimen collection / handling, submission of specimen other than nasopharyngeal swab, presence of viral mutation(s) within the areas targeted by this assay, and inadequate number of viral copies (<250 copies / mL). A negative result must be combined with clinical observations, patient history, and epidemiological information.  Fact Sheet for Patients:    StrictlyIdeas.no  Fact Sheet for Healthcare Providers: BankingDealers.co.za  This test is not yet approved or  cleared by the [place] FDA and has been authorized for detection and/or diagnosis of SARS-CoV-2 by FDA under an Emergency Use Authorization (EUA).  This EUA will remain in effect (meaning this test can be used) for the duration of the COVID-19 declaration under Section 564(b)(1) of the Act, 21 U.S.C. section 360bbb-3(b)(1), unless the authorization is terminated or revoked sooner.  Performed at [facility], [place] [address], [place], [place] [address]     Procedures and diagnostic studies:  DG Chest Portable 1 View  Result Date: 07/11/2020 CLINICAL DATA:  Shortness of breath, cough. EXAM: PORTABLE  CHEST 1 VIEW COMPARISON:  September 15, 2019. FINDINGS: Stable cardiomegaly. No pneumothorax or pleural effusion is noted. Lungs are clear. Bony thorax is unremarkable. IMPRESSION: No active disease. Aortic Atherosclerosis (ICD10-I70.0). Electronically Signed   By: Marijo Conception M.D.   On: 07/11/2020 09:25    Medications:    allopurinol  100 mg Oral Daily   apixaban  5 mg Oral BID   cholecalciferol  1,000 Units Oral Daily   diltiazem  240 mg Oral Daily   insulin aspart  0-9 Units Subcutaneous TID WC   metFORMIN  750 mg Oral Q supper   Continuous Infusions:  0.9 % NaCl with KCl 20 mEq / L 88 mL/hr at 07/12/20 1131   cefTRIAXone (ROCEPHIN)  IV       LOS: 1 day   Geradine Girt  [facility]   How to contact the [facility] Attending or Consulting provider [place] or covering provider during after hours [place], for this patient?  1. Check the care team in [facility] and look for a) attending/consulting [facility] provider listed and b) the [facility] team listed 2. Log into [email or web address] and use [place]'s universal password to access. If you do not have the password, please contact the hospital operator. 3. Locate the [facility]  provider you are looking for under [facility] and page to a number that you can be directly reached. 4. If you still have difficulty reaching the provider, please page the [facility] (Director on Call) for the Hospitalists listed on amion for assistance.  07/12/2020, 1:10 PM

## 2020-07-12 NOTE — ED Notes (Signed)
Paged Dr Olevia Bowens to [facility]

## 2020-07-13 ENCOUNTER — Inpatient Hospital Stay (HOSPITAL_COMMUNITY): Payer: Medicare HMO

## 2020-07-13 LAB — COMPREHENSIVE METABOLIC PANEL
ALT: 99 U/L — ABNORMAL HIGH (ref 0–44)
AST: 123 U/L — ABNORMAL HIGH (ref 15–41)
Albumin: 2.2 g/dL — ABNORMAL LOW (ref 3.5–5.0)
Alkaline Phosphatase: 62 U/L (ref 38–126)
Anion gap: 11 (ref 5–15)
BUN: 16 mg/dL (ref 8–23)
CO2: 24 mmol/L (ref 22–32)
Calcium: 8.6 mg/dL — ABNORMAL LOW (ref 8.9–10.3)
Chloride: 103 mmol/L (ref 98–111)
Creatinine, Ser: 0.96 mg/dL (ref 0.44–1.00)
GFR calc Af Amer: >60 mL/min (ref >60)
GFR calc non Af Amer: 54 mL/min — ABNORMAL LOW (ref >60)
Glucose, Bld: 171 mg/dL — ABNORMAL HIGH (ref 70–99)
Potassium: 3.7 mmol/L (ref 3.5–5.1)
Sodium: 138 mmol/L (ref 135–145)
Total Bilirubin: 0.8 mg/dL (ref 0.3–1.2)
Total Protein: 5.8 g/dL — ABNORMAL LOW (ref 6.5–8.1)

## 2020-07-13 LAB — GLUCOSE, CAPILLARY
Glucose-Capillary: 157 mg/dL — ABNORMAL HIGH (ref 70–99)
Glucose-Capillary: 103 mg/dL — ABNORMAL HIGH (ref 70–99)
Glucose-Capillary: 121 mg/dL — ABNORMAL HIGH (ref 70–99)
Glucose-Capillary: 155 mg/dL — ABNORMAL HIGH (ref 70–99)

## 2020-07-13 LAB — URINE CULTURE: Culture: >=100000 — AB

## 2020-07-13 LAB — CBC
HCT: 34 % — ABNORMAL LOW (ref 36.0–46.0)
Hemoglobin: 11.3 g/dL — ABNORMAL LOW (ref 12.0–15.0)
MCH: 31.7 pg (ref 26.0–34.0)
MCHC: 33.2 g/dL (ref 30.0–36.0)
MCV: 95.2 fL (ref 80.0–100.0)
Platelets: 131 10*3/uL — ABNORMAL LOW (ref 150–400)
RBC: 3.57 MIL/uL — ABNORMAL LOW (ref 3.87–5.11)
RDW: 14.3 % (ref 11.5–15.5)
WBC: 9.5 10*3/uL (ref 4.0–10.5)
nRBC: 0 % (ref 0.0–0.2)

## 2020-07-13 MED ORDER — PANTOPRAZOLE SODIUM 40 MG PO TBEC
40.0000 mg | DELAYED_RELEASE_TABLET | Freq: Every day | ORAL | Status: DC
  Administered 2020-07-13 – 2020-07-14 (×2): 40 mg via ORAL
  Filled 2020-07-13 (×2): qty 1

## 2020-07-13 NOTE — Plan of Care (Signed)
  Problem: Education: Goal: Knowledge of General Education information will improve Description Including pain rating scale, medication(s)/side effects and non-pharmacologic comfort measures Outcome: Progressing   

## 2020-07-13 NOTE — Evaluation (Addendum)
I agree with the following treatment note after review of the documentation. This session was performed under the supervision of a licensed clinician.   Lou Miner, DPT  Acute Rehabilitation Services  Pager: [phone]  Physical Therapy Evaluation Patient Details Name: Anne Roach MRN: [id number] DOB: [birth year] Today's Date: 07/13/2020   History of Present Illness  Pt is a 86 yo female who was admitted with dyspnea, fever, fatigue, night sweats and decreased appetite. Pt was found to have sepsis due to a UTI. Pt has a PMH of HTN, T2DM, Essential tremor, a-fib and CKD.   Clinical Impression  Pt was evaluated and assessed for above diagnosis and impairments below. Pt required supervision to min guard assist for all mobility tasks. Pt utilized RW for transfers and ambulation. Limited secondary to fatigue. Pt lives at home with daughter who is only available PRN. Recommend HHPT for this pt at d/c. Pt would continue to benefit from acute therapy in order to increase her independence with functional mobility tasks. Will continue to follow acutely.     Follow Up Recommendations Home health PT;Supervision for mobility/OOB    Equipment Recommendations  None recommended by PT    Recommendations for Other Services       Precautions / Restrictions Precautions Precautions: Fall Restrictions Weight Bearing Restrictions: No      Mobility  Bed Mobility Overal bed mobility: Needs Assistance Bed Mobility: Supine to Sit     Supine to sit: Supervision;HOB elevated     General bed mobility comments: pt required supervision for safety with bed mobility  Transfers Overall transfer level: Needs assistance Equipment used: Rolling walker (2 wheeled) Transfers: Sit to/from Stand Sit to Stand: Min guard         General transfer comment: pt required min gaurd A with RW for safety with sit<>stand and increased time with power up to standing. pt required verbal cues for hand placement  for safe transfer with RW   Ambulation/Gait Ambulation/Gait assistance: Min guard Gait Distance (Feet): 20 Feet Assistive device: Rolling walker (2 wheeled) Gait Pattern/deviations: Step-through pattern;Decreased step length - right;Decreased step length - left Gait velocity: decreased   General Gait Details: pt was noted to have slow, but overall steady in gait with RW. Pt required verbal cues for RW sequencing and proximity to device. Pt limited secondary to fatigue. Pt required sitting rest break after 15 feet for a few minutes then transfered to chair  Stairs            Wheelchair Mobility    Modified Rankin (Stroke Patients Only)       Balance Overall balance assessment: Needs assistance Sitting-balance support: No upper extremity supported;Feet supported Sitting balance-Leahy Scale: Fair Sitting balance - Comments: pt was able to sit EOB without UE support   Standing balance support: No upper extremity supported;During functional activity;Bilateral upper extremity supported Standing balance-Leahy Scale: Fair Standing balance comment: Able to perform static standing without UE support               Pertinent Vitals/Pain Pain Assessment: 0-10 Pain Score: 6  Pain Location: stomach Pain Descriptors / Indicators: Grimacing;Discomfort Pain Intervention(s): Limited activity within patient's tolerance;Monitored during session;Repositioned    Home Living Family/patient expects to be discharged to:: Private residence Living Arrangements: Children Available Help at Discharge: Family;Available PRN/intermittently Type of Home: House Home Access: Stairs to enter;Ramped entrance Entrance Stairs-Rails: None Entrance Stairs-Number of Steps: 3 Home Layout: Able to live on main level with bedroom/bathroom;Two level Home Equipment: Gilford Rile -  2 wheels;Wheelchair - [employer];Shower seat - built in;Grab bars - tub/shower Additional Comments: pt lives with her  daughter who works at [employer] as a professor    Prior Function Level of Independence: Independent with assistive device(s)         Comments: pt would sometimes use a RW and other days not. Only used it if she felt sick.      Hand Dominance        Extremity/Trunk Assessment   Upper Extremity Assessment Upper Extremity Assessment: Defer to OT evaluation    Lower Extremity Assessment Lower Extremity Assessment: Generalized weakness    Cervical / Trunk Assessment Cervical / Trunk Assessment: Normal  Communication   Communication: Prefers language other than English (pt spoke broken [place], daughter translated)  Cognition Arousal/Alertness: Awake/alert Behavior During Therapy: WFL for tasks assessed/performed Overall Cognitive Status: Within Functional Limits for tasks assessed              General Comments General comments (skin integrity, edema, etc.): pt was educated on energy conservation. Pt was on 2L O2 [place] but not usualy on it at home    Exercises     Assessment/Plan    PT Assessment Patient needs continued PT services  PT Problem List Decreased strength;Decreased activity tolerance;Decreased balance;Decreased coordination;Decreased mobility;Decreased knowledge of use of DME;Decreased safety awareness;Pain;Cardiopulmonary status limiting activity       PT Treatment Interventions DME instruction;Gait training;Functional mobility training;Therapeutic activities;Therapeutic exercise;Balance training;Neuromuscular re-education;Patient/family education    PT Goals (Current goals can be found in the Care Plan section)  Acute Rehab PT Goals Patient Stated Goal: get back home PT Goal Formulation: With patient/family Time For Goal Achievement: 07/27/20 Potential to Achieve Goals: Fair    Frequency Min 3X/week   Barriers to discharge        Co-evaluation               AM-PAC PT "6 Clicks" Mobility  Outcome Measure Help needed turning from your back to  your side while in a flat bed without using bedrails?: None Help needed moving from lying on your back to sitting on the side of a flat bed without using bedrails?: None Help needed moving to and from a bed to a chair (including a wheelchair)?: A Little Help needed standing up from a chair using your arms (e.g., wheelchair or bedside chair)?: A Little Help needed to walk in hospital room?: A Little Help needed climbing 3-5 steps with a railing? : A Lot 6 Click Score: 19    End of Session Equipment Utilized During Treatment: Gait belt;Oxygen (2L) Activity Tolerance: Patient tolerated treatment well Patient left: in chair;with call bell/phone within reach;with family/visitor present Nurse Communication: Mobility status PT Visit Diagnosis: Unsteadiness on feet (R26.81);History of falling (Z91.81);Muscle weakness (generalized) (M62.81);Pain Pain - part of body:  (stomach)    Time: 4287-6811 PT Time Calculation (min) (ACUTE ONLY): 32 min   Charges:   PT Evaluation $PT Eval Low Complexity: 1 Low PT Treatments $Gait Training: 8-22 mins       Gloriann Loan, SPT  Acute Rehabilitation Services  Office: [phone]  07/13/2020, 5:35 PM

## 2020-07-13 NOTE — Progress Notes (Addendum)
Progress Note    Anne Roach  WYO:[id number] DOB: 12/15/1932  DOA: 07/11/2020 PCP: Lucianne Lei, MD    Brief Narrative:     Medical records reviewed and are as summarized below:  Anne Roach is an 86 y.o. female with medical history significant of type 2 diabetes mellitus, essential hypertension, essential tremor, chronic atrial fibrillation, stage IIIa CKD who is brought to the emergency department via EMS after her daughter called 911 due to her mother waking up dyspneic seems this morning.  She had a fever in the emergency department at 103.3F.  She has also been having decreased appetite, fatigue, rigors and night sweats.  She has had mild dysuria.   Assessment/Plan:   Principal Problem:   Sepsis secondary to UTI [facility]) Active Problems:   Type 2 diabetes mellitus (HCC)   HTN (hypertension)   Chronic kidney disease (CKD), stage III (moderate)   Hypokalemia   Chronic atrial fibrillation (HCC)   Sepsis secondary to UTI POA - gram negative bacteria  Continue ceftriaxone 1 g IVPB every 24 hours Follow-up blood cultures and sensitivity (not done prior to IV abx) Urine culture with gram neg    Hypokalemia/hypomagnesemia Replacing through IVF. -replace MG IV    Chronic atrial fibrillation (HCC) [place] vasc 2 Score of at least 6. Continue apixaban 5 mg p.o. twice daily. Continue Cardizem 240 mg p.o. daily.    Type 2 diabetes mellitus (HCC) Carbohydrate modified diet. SSI    HTN (hypertension) Continue Cardizem 240 mg p.o. daily. Monitor blood pressure and heart rate.    Chronic kidney disease (CKD), stage IIIa Improved with IVF  Mild elevation of liver enzymes -trend LFTs -get RUQ u/s -bilirubin normal -avoid hypotension   Family Communication/Anticipated D/C date and plan/Code Status   DVT prophylaxis: Lovenox ordered. Code Status: Full Code.  Disposition Plan: Status is: Inpatient LM for daughter Remains inpatient appropriate  because:Inpatient level of care appropriate due to severity of illness   Dispo: The patient is from: Home              Anticipated d/c is to: Home              Anticipated d/c date is: 2 days              Patient currently is not medically stable to d/c.         Medical Consultants:    None.     Subjective:   Feeling much better Has not been wanting to eat due to abdominal pain  Objective:    Vitals:   07/12/20 1653 07/12/20 2036 07/13/20 0403 07/13/20 0925  BP: 139/80 118/78 138/76 139/74  Pulse: 98 84 85 70  Resp: 18 18 20 18   Temp: (!) 100.6 F (38.1 C) 98.9 F (37.2 C) 98.6 F (37 C) 98.8 F (37.1 C)  TempSrc: Oral Oral Oral Oral  SpO2: 91% 94% 93% 91%    Intake/Output Summary (Last 24 hours) at 07/13/2020 1100 Last data filed at 07/13/2020 0200 Gross per 24 hour  Intake 220 ml  Output 225 ml  Net -5 ml   There were no vitals filed for this visit.  Exam:  General: Appearance:     Overweight female in no acute distress- seen with arabic interpreter Sahar     Lungs:     Clear to auscultation bilaterally, respirations unlabored  Heart:    Normal heart rate. irr  MS:   All extremities are intact.   Neurologic:  Awake, alert, oriented x 3. No apparent focal neurological           defect.                        Data Reviewed:   I have personally reviewed following labs and imaging studies:  Labs: Labs show the following:   Basic Metabolic Panel: Recent Labs  Lab 07/11/20 0903 07/11/20 0903 07/12/20 0445 07/13/20 0443  NA 134*  --  137 138  K 3.4*   < > 3.4* 3.7  CL 96*  --  102 103  CO2 25  --  25 24  GLUCOSE 175*  --  156* 171*  BUN 24*  --  21 16  CREATININE 1.39*  --  1.05* 0.96  CALCIUM 8.8*  --  8.2* 8.6*  MG  --   --  1.4*  --   PHOS  --   --  2.7  --    < > = values in this interval not displayed.   GFR CrCl cannot be calculated (Unknown ideal weight.). Liver Function Tests: Recent Labs  Lab 07/11/20 0903  07/12/20 0445 07/13/20 0443  AST 57* 82* 123*  ALT 38 63* 99*  ALKPHOS 46 45 62  BILITOT 0.8 0.9 0.8  PROT 6.2* 5.4* 5.8*  ALBUMIN 2.8* 2.2* 2.2*   No results for input(s): LIPASE, AMYLASE in the last 168 hours. No results for input(s): AMMONIA in the last 168 hours. Coagulation profile No results for input(s): INR, PROTIME in the last 168 hours.  CBC: Recent Labs  Lab 07/11/20 0903 07/13/20 0443  WBC 10.8* 9.5  NEUTROABS 7.9*  --   HGB 11.7* 11.3*  HCT 35.5* 34.0*  MCV 94.9 95.2  PLT 135* 131*   Cardiac Enzymes: No results for input(s): CKTOTAL, CKMB, CKMBINDEX, TROPONINI in the last 168 hours. BNP (last 3 results) No results for input(s): PROBNP in the last 8760 hours. CBG: Recent Labs  Lab 07/12/20 0813 07/12/20 1315 07/12/20 1753 07/12/20 2036 07/13/20 0644  GLUCAP 107* 131* 144* 164* 157*   D-Dimer: No results for input(s): DDIMER in the last 72 hours. Hgb A1c: Recent Labs    07/12/20 0445  HGBA1C 6.3*   Lipid Profile: No results for input(s): CHOL, HDL, LDLCALC, TRIG, CHOLHDL, LDLDIRECT in the last 72 hours. Thyroid function studies: No results for input(s): TSH, T4TOTAL, T3FREE, THYROIDAB in the last 72 hours.  Invalid input(s): FREET3 Anemia work up: No results for input(s): VITAMINB12, FOLATE, FERRITIN, TIBC, IRON, RETICCTPCT in the last 72 hours. Sepsis Labs: Recent Labs  Lab 07/11/20 0903 07/11/20 1659 07/13/20 0443  WBC 10.8*  --  9.5  LATICACIDVEN 1.6 1.9  --     Microbiology Recent Results (from the past 240 hour(s))  SARS Coronavirus 2 by RT PCR (hospital order, performed in [facility] hospital lab) Nasopharyngeal Nasopharyngeal Swab     Status: None   Collection Time: 07/11/20 11:38 AM   Specimen: Nasopharyngeal Swab  Result Value Ref Range Status   SARS Coronavirus 2 NEGATIVE NEGATIVE Final    Comment: (NOTE) SARS-CoV-2 target nucleic acids are NOT DETECTED.  The SARS-CoV-2 RNA is generally detectable in upper and  lower respiratory specimens during the acute phase of infection. The lowest concentration of SARS-CoV-2 viral copies this assay can detect is 250 copies / mL. A negative result does not preclude SARS-CoV-2 infection and should not be used as the sole basis for treatment or other patient management decisions.  A negative result may occur with improper specimen collection / handling, submission of specimen other than nasopharyngeal swab, presence of viral mutation(s) within the areas targeted by this assay, and inadequate number of viral copies (<250 copies / mL). A negative result must be combined with clinical observations, patient history, and epidemiological information.  Fact Sheet for Patients:   StrictlyIdeas.no  Fact Sheet for Healthcare Providers: BankingDealers.co.za  This test is not yet approved or  cleared by the [place] FDA and has been authorized for detection and/or diagnosis of SARS-CoV-2 by FDA under an Emergency Use Authorization (EUA).  This EUA will remain in effect (meaning this test can be used) for the duration of the COVID-19 declaration under Section 564(b)(1) of the Act, 21 U.S.C. section 360bbb-3(b)(1), unless the authorization is terminated or revoked sooner.  Performed at [facility], [place] [address], [place], [place] [address]   Urine Culture     Status: Abnormal (Preliminary result)   Collection Time: 07/11/20  8:17 PM   Specimen: Urine, Clean Catch  Result Value Ref Range Status   Specimen Description URINE, CLEAN CATCH  Final   Special Requests NONE  Final   Culture (A)  Final    >=100,000 COLONIES/mL GRAM NEGATIVE RODS IDENTIFICATION AND SUSCEPTIBILITIES TO FOLLOW Performed at [facility], [place] [address] [place], [place] [address]    Report Status PENDING  Incomplete  Culture, blood (Routine X 2) w Reflex to ID Panel     Status: None (Preliminary result)   Collection Time:  07/12/20 10:18 AM   Specimen: BLOOD  Result Value Ref Range Status   Specimen Description BLOOD SITE NOT SPECIFIED  Final   Special Requests   Final    BOTTLES DRAWN AEROBIC AND ANAEROBIC Blood Culture adequate volume   Culture   Final    NO GROWTH < 24 HOURS Performed at [facility], [place] [address], [place], [place] [address]    Report Status PENDING  Incomplete  Culture, blood (Routine X 2) w Reflex to ID Panel     Status: None (Preliminary result)   Collection Time: 07/12/20 10:32 AM   Specimen: BLOOD  Result Value Ref Range Status   Specimen Description BLOOD SITE NOT SPECIFIED  Final   Special Requests   Final    BOTTLES DRAWN AEROBIC AND ANAEROBIC Blood Culture adequate volume   Culture   Final    NO GROWTH < 24 HOURS Performed at [facility], [place] [address], [place],  [address]    Report Status PENDING  Incomplete    Procedures and diagnostic studies:  No results found.  Medications:   . allopurinol  100 mg Oral Daily  . apixaban  5 mg Oral BID  . cholecalciferol  1,000 Units Oral Daily  . diltiazem  240 mg Oral Daily  . insulin aspart  0-9 Units Subcutaneous TID WC  . pantoprazole  40 mg Oral Daily   Continuous Infusions: . cefTRIAXone (ROCEPHIN)  IV 1 g (07/12/20 2203)     LOS: 2 days   Geradine Girt  [facility]   How to contact the [facility] Attending or Consulting provider [place] or covering provider during after hours [place], for this patient?  1. Check the care team in [facility] and look for a) attending/consulting TR[facility] provider listed and b) the [facility] team listed 2. Log into [email or web address] and use [place]'s universal password to access. If you do not have the password,  please contact the hospital operator. 3. Locate the [facility] provider you are looking for under [facility] and page to a number that you can be directly reached. 4. If you still have difficulty reaching the provider, please page the [facility] (Director on Call) for the  Hospitalists listed on amion for assistance.  07/13/2020, 11:00 AM

## 2020-07-14 ENCOUNTER — Encounter (HOSPITAL_COMMUNITY): Payer: Self-pay | Admitting: Internal Medicine

## 2020-07-14 LAB — CBC
HCT: 30.7 % — ABNORMAL LOW (ref 36.0–46.0)
Hemoglobin: 10.2 g/dL — ABNORMAL LOW (ref 12.0–15.0)
MCH: 31.4 pg (ref 26.0–34.0)
MCHC: 33.2 g/dL (ref 30.0–36.0)
MCV: 94.5 fL (ref 80.0–100.0)
Platelets: 148 10*3/uL — ABNORMAL LOW (ref 150–400)
RBC: 3.25 MIL/uL — ABNORMAL LOW (ref 3.87–5.11)
RDW: 14 % (ref 11.5–15.5)
WBC: 9.5 10*3/uL (ref 4.0–10.5)
nRBC: 0 % (ref 0.0–0.2)

## 2020-07-14 LAB — COMPREHENSIVE METABOLIC PANEL
ALT: 81 U/L — ABNORMAL HIGH (ref 0–44)
AST: 79 U/L — ABNORMAL HIGH (ref 15–41)
Albumin: 2.1 g/dL — ABNORMAL LOW (ref 3.5–5.0)
Alkaline Phosphatase: 51 U/L (ref 38–126)
Anion gap: 11 (ref 5–15)
BUN: 12 mg/dL (ref 8–23)
CO2: 26 mmol/L (ref 22–32)
Calcium: 8.7 mg/dL — ABNORMAL LOW (ref 8.9–10.3)
Chloride: 103 mmol/L (ref 98–111)
Creatinine, Ser: 1.02 mg/dL — ABNORMAL HIGH (ref 0.44–1.00)
GFR calc Af Amer: 58 mL/min — ABNORMAL LOW (ref >60)
GFR calc non Af Amer: 50 mL/min — ABNORMAL LOW (ref >60)
Glucose, Bld: 129 mg/dL — ABNORMAL HIGH (ref 70–99)
Potassium: 3.1 mmol/L — ABNORMAL LOW (ref 3.5–5.1)
Sodium: 140 mmol/L (ref 135–145)
Total Bilirubin: 0.4 mg/dL (ref 0.3–1.2)
Total Protein: 5.6 g/dL — ABNORMAL LOW (ref 6.5–8.1)

## 2020-07-14 LAB — GLUCOSE, CAPILLARY
Glucose-Capillary: 128 mg/dL — ABNORMAL HIGH (ref 70–99)
Glucose-Capillary: 168 mg/dL — ABNORMAL HIGH (ref 70–99)

## 2020-07-14 MED ORDER — CEPHALEXIN 500 MG PO CAPS: 500.0000 mg | ORAL_CAPSULE | Freq: Two times a day (BID) | ORAL | 0 refills | Status: DC

## 2020-07-14 MED ORDER — POTASSIUM CHLORIDE CRYS ER 20 MEQ PO TBCR
40.0000 meq | EXTENDED_RELEASE_TABLET | Freq: Once | ORAL | Status: AC
  Administered 2020-07-14: 40 meq via ORAL
  Filled 2020-07-14: qty 2

## 2020-07-14 MED ORDER — MAGNESIUM OXIDE 400 (241.3 MG) MG PO TABS
400.0000 mg | ORAL_TABLET | Freq: Every day | ORAL | Status: DC
  Administered 2020-07-14: 400 mg via ORAL
  Filled 2020-07-14: qty 1

## 2020-07-14 MED ORDER — CEPHALEXIN 500 MG PO CAPS: 500.0000 mg | ORAL_CAPSULE | Freq: Two times a day (BID) | ORAL | Status: DC

## 2020-07-14 MED ORDER — MAGNESIUM OXIDE 400 (241.3 MG) MG PO TABS
400.0000 mg | ORAL_TABLET | Freq: Every day | ORAL | 0 refills | Status: DC
Start: 2020-07-15 — End: 2021-01-18

## 2020-07-14 NOTE — Progress Notes (Signed)
DISCHARGE NOTE HOME Anne Roach to be discharged Home per MD order. Discussed prescriptions and follow up appointments with the patient. Prescriptions given to patient; medication list explained in detail. Patient verbalized understanding.  Skin clean, dry and intact without evidence of skin break down, no evidence of skin tears noted. IV catheter discontinued intact. Site without signs and symptoms of complications. Dressing and pressure applied. Pt denies pain at the site currently. No complaints noted.  Patient free of lines, drains, and wounds.   An After Visit Summary (AVS) was printed and given to the patients daughter. Patient escorted via wheelchair, and discharged home via private auto.  Vira Agar, RN

## 2020-07-14 NOTE — TOC Transition Note (Signed)
Transition of Care [facility]) - CM/SW Discharge Note   Patient Details  Name: Anne Roach MRN: [id number] Date of Birth: 26-Sep-1933  Transition of Care [facility]) CM/SW Contact:  Carles Collet, RN Phone Number: 07/14/2020, 11:36 AM   Clinical Narrative:    Consult per MD patient declines [place] services, needs 3/1. Referral made to adapt for 3/1 to be delivered to the room today.     Final next level of care: Home/Self Care Barriers to Discharge: No Barriers Identified   Patient Goals and CMS Choice        Discharge Placement                       Discharge Plan and Services                DME Arranged: 3-N-1 DME Agency: [employer] Date DME Agency Contacted: 07/14/20 Time DME Agency Contacted: [id number] Representative spoke with at DME Agency: [place] ([place]) Interventions     Readmission Risk Interventions No flowsheet data found.

## 2020-07-14 NOTE — Discharge Instructions (Signed)
Urosepsis, Adult  Urosepsis is a type of sepsis. Sepsis is a severe bodily reaction to an infection. Urosepsis is caused by a bacterial infection that starts in the urinary tract and spreads to the blood. The urinary tract is the system where urine is made, stored, and passed out of the body and includes the kidneys, ureters, bladder, and urethra. This may also be called the urinary system. In severe cases, sepsis can lead to septic shock. Septic shock can weaken your heart and cause your blood pressure to drop. This can make the body's central nervous system and other vital organs stop working. Urosepsis is a medical emergency that requires immediate treatment in a hospital. What are the causes? Common causes of this condition include:  A urinary tract infection (UTI) that spreads to your blood.  A urinary tract blockage due to kidney stones.  Swelling and inflammation of the prostate (prostatitis) or prostate infection, in males. What increases the risk? You are more likely to develop this condition if you:  Are female, especially if you are sexually active.  Are age 65 or older.  Have a long-term disease, such as kidney disease or diabetes.  Have a weak disease-fighting system (immune system).  Have a condition that lessens or changes urine flow, such as a kidney or bladder stone, prostate disease, or a tumor in the urinary tract.  Have had surgery in an area of the urinary tract.  Have a small, thin tube in your urethra that drains urine from your bladder for a period of time (indwelling urinary catheter).  Have lost feeling below the waist or are in a wheelchair. What are the signs or symptoms? Early symptoms of this condition are similar to symptoms of a severe UTI. Common symptoms of this condition include:  Pain in your side, back, or lower abdomen.  Fever and chills.  Nausea and vomiting.  Frequent need to pass urine.  Burning pain when passing urine.  Bloody or  cloudy urine.  Bad-smelling urine.  Fatigue.  Trouble passing urine or not being able to pass urine at all. Once the infection has spread to the blood and a sepsis reaction starts, other symptoms may include:  Chills with shaking.  Cold and clammy skin.  A fever of 101.3F (38.5C) or higher.  Low body temperature of 96.8F (36C) or lower.  Fast breathing.  Trouble breathing.  Fast heartbeat.  Severe pain in the abdomen.  Muscle aches.  Anxiety.  Problems staying awake.  Confusion.  Fainting. How is this diagnosed? This condition is diagnosed based on your symptoms, your medical history, and a physical exam. You may also have:  Urine tests or blood tests to check kidney function and to look for infection.  Imaging tests such as a CT scan or ultrasound to check for blockages in the urinary system. How is this treated? This condition is a medical emergency that needs to be treated right away in the hospital. This condition may be treated with:  An IV so that you can quickly receive: ? Antibiotic medicines. ? Fluids. ? Medicines to support blood pressure.  Oxygen and breathing support, if needed.  Removing a urinary catheter if it is the source of the infection, if this applies.  Filtering your blood with a machine (dialysis). This process cleans your blood if your kidneys have failed.  Surgery to drain infected areas or restore urine flow. This is rare. Follow these instructions at home: Medicines  Take over-the-counter and prescription medicines only as told   told by your health care provider.  If you were prescribed an antibiotic medicine, take it as told by your health care provider. Do not stop using the antibiotic even if you start to feel better. General instructions   Drink enough fluid to keep your urine pale yellow.  Return to your normal activities as told by your health care provider. Ask your health care provider what activities are safe for  you.  Keep all follow-up visits as told by your health care provider. This is important. Contact a health care provider if:  You have symptoms that get worse or do not get better with treatment.  You have new UTI symptoms. Get help right away if:  You have new or continued symptoms of sepsis after hospitalization, such as: ? A fever of 101.67F (38.5C) or higher. ? Low body temperature of 96.49F (36C) or lower. ? Chills. ? Severe pain. ? Difficulty breathing. ? Confusion. ? Sleepiness. ? Nausea and vomiting. These symptoms may represent a serious problem that is an emergency. Do not wait to see if the symptoms will go away. Get medical help right away. Call your local emergency services (911 in the [place] Do not drive yourself to the hospital. Summary  Urosepsis is a type of sepsis. Sepsis is a severe bodily reaction to an infection.  Urosepsis is a medical emergency that requires immediate treatment in a hospital.  Possible causes of urosepsis include a urinary tract infection that spreads to your blood, blockage from kidney stones, and prostate swelling or infection in males.  This condition may be treated with an IV so that you can quickly receive antibiotic medicines, fluids, and medicines to support your blood pressure. Other treatments may be used as well.  Get help right away if you have new or continued symptoms of sepsis after hospitalization. This information is not intended to replace advice given to you by your health care provider. Make sure you discuss any questions you have with your health care provider. Document Revised: 12/01/2018 Document Reviewed: 12/02/2018 Elsevier Patient Education  [place] on my medicine - ELIQUIS (apixaban)  This medication education was reviewed with me or my healthcare representative as part of my discharge preparation.  The pharmacist that spoke with me during my hospital stay was:  Onnie Boer, RPH-CPP  Why  was Eliquis prescribed for you? Eliquis was prescribed for you to reduce the risk of a blood clot forming that can cause a stroke if you have a medical condition called atrial fibrillation (a type of irregular heartbeat).  What do You need to know about Eliquis ? Take your Eliquis TWICE DAILY - one tablet in the morning and one tablet in the evening with or without food. If you have difficulty swallowing the tablet whole please discuss with your pharmacist how to take the medication safely.  Take Eliquis exactly as prescribed by your doctor and DO NOT stop taking Eliquis without talking to the doctor who prescribed the medication.  Stopping may increase your risk of developing a stroke.  Refill your prescription before you run out.  After discharge, you should have regular check-up appointments with your healthcare provider that is prescribing your Eliquis.  In the future your dose may need to be changed if your kidney function or weight changes by a significant amount or as you get older.  What do you do if you miss a dose? If you miss a dose, take it as soon as you remember on the same day  and resume taking twice daily.  Do not take more than one dose of ELIQUIS at the same time to make up a missed dose.  Important Safety Information A possible side effect of Eliquis is bleeding. You should call your healthcare provider right away if you experience any of the following: ? Bleeding from an injury or your nose that does not stop. ? Unusual colored urine (red or dark brown) or unusual colored stools (red or black). ? Unusual bruising for unknown reasons. ? A serious fall or if you hit your head (even if there is no bleeding).  Some medicines may interact with Eliquis and might increase your risk of bleeding or clotting while on Eliquis. To help avoid this, consult your healthcare provider or pharmacist prior to using any new prescription or non-prescription medications, including  herbals, vitamins, non-steroidal anti-inflammatory drugs (NSAIDs) and supplements.  This website has more information on Eliquis (apixaban): http://www.eliquis.com/eliquis/home

## 2020-07-14 NOTE — Evaluation (Signed)
Occupational Therapy Evaluation Patient Details Name: Anne Roach MRN: [id number] DOB: January 25, 1933 Today's Date: 07/14/2020    History of Present Illness Pt is a 86 yo female who was admitted with dyspnea, fever, fatigue, night sweats and decreased appetite. Pt was found to have sepsis due to a UTI. Pt has a PMH of HTN, T2DM, Essential tremor, a-fib and CKD.    Clinical Impression   Pt admitted with the above diagnoses and presents with below problem list. Pt will benefit from continued acute OT to address the below listed deficits and maximize independence with basic ADLs prior to d/c home. At baseline pt is independent to mod I with ADLs. Pt currently min guard with LB ADLs. Limited mobility during session with pt citing pain/heaviness in BLE at knee level and distal "like I'm wearing iron boots." Mild edema noted in L>R ankles and feet. Nursing notified. Pt up in recliner at end of session.     Follow Up Recommendations  Home health OT;Supervision - Intermittent    Equipment Recommendations  3 in 1 bedside commode    Recommendations for Other Services       Precautions / Restrictions Precautions Precautions: Fall Restrictions Weight Bearing Restrictions: No      Mobility Bed Mobility Overal bed mobility: Needs Assistance Bed Mobility: Supine to Sit     Supine to sit: Supervision;HOB elevated     General bed mobility comments: pt required supervision for safety with bed mobility  Transfers Overall transfer level: Needs assistance Equipment used: None Transfers: Sit to/from Stand Sit to Stand: Min guard         General transfer comment: min guard for safety    Balance Overall balance assessment: Needs assistance Sitting-balance support: No upper extremity supported;Feet supported Sitting balance-Leahy Scale: Fair Sitting balance - Comments: pt was able to sit EOB without UE support   Standing balance support: No upper extremity supported;During functional  activity;Bilateral upper extremity supported Standing balance-Leahy Scale: Fair Standing balance comment: Able to perform static standing without UE support. seeking external support during dynamic tasks but declined rw.                           ADL either performed or assessed with clinical judgement   ADL Overall ADL's : Needs assistance/impaired Eating/Feeding: Set up;Sitting   Grooming: Set up;Sitting   Upper Body Bathing: Set up;Sitting   Lower Body Bathing: Min guard;Sit to/from stand   Upper Body Dressing : Set up;Sitting   Lower Body Dressing: Min guard;Sit to/from stand   Toilet Transfer: Min guard;Ambulation   Toileting- Clothing Manipulation and Hygiene: Min guard       Functional mobility during ADLs: Min guard General ADL Comments: Pt walked EOB to recliner. Declined further mobility citing BLE pain.  Declined rw even as a means of offloading pressure. on BLE.     Vision         Perception     Praxis      Pertinent Vitals/Pain Pain Assessment: Faces Faces Pain Scale: Hurts even more Pain Location: B knees and distal "like I'm wearing iron boots" Pain Descriptors / Indicators: Grimacing;Discomfort Pain Intervention(s): Monitored during session;Limited activity within patient's tolerance;Other (comment) (pt declined elevating BLE in recliner)     Hand Dominance     Extremity/Trunk Assessment Upper Extremity Assessment Upper Extremity Assessment: Generalized weakness;Overall [facility] for tasks assessed   Lower Extremity Assessment Lower Extremity Assessment: Defer to PT evaluation;Generalized weakness (mild edema in  R>L ankles and feet)       Communication Communication Communication: Other (comment) (began with video interpreter which then pt declined)   Cognition Arousal/Alertness: Awake/alert Behavior During Therapy: WFL for tasks assessed/performed Overall Cognitive Status: Within Functional Limits for tasks assessed                                      General Comments       Exercises     Shoulder Instructions      Home Living Family/patient expects to be discharged to:: Private residence Living Arrangements: Children Available Help at Discharge: Family;Available PRN/intermittently Type of Home: House Home Access: Stairs to enter;Ramped entrance Entrance Stairs-Number of Steps: 3 Entrance Stairs-Rails: None Home Layout: Able to live on main level with bedroom/bathroom;Two level Alternate Level Stairs-Number of Steps: 13 Alternate Level Stairs-Rails: Right Bathroom Shower/Tub: Occupational psychologist: Standard Bathroom Accessibility: Yes   Home Equipment: Environmental consultant - 2 wheels;Wheelchair - [employer];Shower seat - built in;Grab bars - tub/shower   Additional Comments: pt lives with her daughter who works at [employer] as a professor      Prior Functioning/Environment Level of Independence: Independent with assistive device(s)        Comments: pt would sometimes use a RW and other days not. Only used it if she felt sick.         OT Problem List: Decreased activity tolerance;Impaired balance (sitting and/or standing);Decreased knowledge of use of DME or AE;Decreased knowledge of precautions;Pain      OT Treatment/Interventions: Self-care/ADL training;Therapeutic exercise;Energy conservation;DME and/or AE instruction;Therapeutic activities;Patient/family education;Balance training    OT Goals(Current goals can be found in the care plan section) Acute Rehab OT Goals Patient Stated Goal: get back home OT Goal Formulation: With patient Time For Goal Achievement: 07/28/20 Potential to Achieve Goals: Good ADL Goals Pt Will Perform Lower Body Bathing: with modified independence;sit to/from stand Pt Will Perform Lower Body Dressing: with modified independence;sit to/from stand Pt Will Transfer to Toilet: with modified independence;ambulating Pt Will Perform Toileting -  Clothing Manipulation and hygiene: with modified independence;sit to/from stand  OT Frequency: Min 2X/week   Barriers to D/C:            Co-evaluation              AM-PAC OT "6 Clicks" Daily Activity     Outcome Measure Help from another person eating meals?: None Help from another person taking care of personal grooming?: None Help from another person toileting, which includes using toliet, bedpan, or urinal?: A Little Help from another person bathing (including washing, rinsing, drying)?: A Little Help from another person to put on and taking off regular upper body clothing?: None Help from another person to put on and taking off regular lower body clothing?: A Little 6 Click Score: 21   End of Session Nurse Communication: Mobility status;Other (comment) (pain in BLE )  Activity Tolerance: Patient limited by pain Patient left: in chair;with call bell/phone within reach;with chair alarm set  OT Visit Diagnosis: Unsteadiness on feet (R26.81);Pain;Muscle weakness (generalized) (M62.81)                Time: 5631-4970 OT Time Calculation (min): 20 min Charges:  OT General Charges $OT Visit: 1 Visit OT Evaluation $OT Eval Low Complexity: [place], OT Acute Rehabilitation Services Pager: [phone] Office: [phone]   Hortencia Pilar 07/14/2020, 12:05  PM

## 2020-07-14 NOTE — Discharge Summary (Signed)
Physician Discharge Summary  Anne Roach GTX:[id number] DOB: [birth year] DOA: 07/11/2020  PCP: Lucianne Lei, MD  Admit date: 07/11/2020 Discharge date: 07/14/2020  Admitted From: home Discharge disposition: home   Recommendations for Outpatient Follow-Up:   1. If abdominal discomfort continues despite PPI/H2 blocker consider HIDA/EGD 2. Monitor liver function 3. Declined home health   Discharge Diagnosis:   Principal Problem:   Sepsis secondary to UTI [facility]) Active Problems:   Type 2 diabetes mellitus (HCC)   HTN (hypertension)   Chronic kidney disease (CKD), stage III (moderate)   Hypokalemia   Chronic atrial fibrillation ([place])    Discharge Condition: Improved.  Diet recommendation: Low sodium, heart healthy.  Carbohydrate-modified.   Wound care: None.  Code status: Full.   History of Present Illness:   Anne Roach is a 86 y.o. female with medical history significant of type 2 diabetes mellitus, essential hypertension, essential tremor, chronic atrial fibrillation, stage IIIa CKD who is brought to the emergency department via EMS after her daughter called 911 due to her mother waking up dyspneic seems this morning.  She had a fever in the emergency department at 103.7F.  She has also been having decreased appetite, fatigue, rigors and night sweats.  She has had mild dysuria.  Review of systems also reveals dry cough for several days and a history of a fall without apparent injury yesterday.  No history of head trauma or LOC during the fall.  She denies wheezing, hemoptysis, chest pain, palpitations, PND, orthopnea or pitting edema of the lower extremities.  She has been feeling nauseous, but denies abdominal pain, emesis, diarrhea, constipation, melena or hematochezia.  She denies polyuria, polydipsia, polyphagia or blurred vision.   Hospital Course by Problem:   Sepsis secondary to UTI POA- ecoli bacteria ceftriaxone 1 g IVPB every 24 hours changed to  keflex for 5 days total -sepsis resolved  Hypokalemia/hypomagnesemia Replaced  Chronic atrial fibrillation ([place]) [place] vasc 2 Scoreof at least 6. Continue apixaban 5 mg p.o. twice daily. Continue Cardizem 240 mg p.o. daily.  Type 2 diabetes mellitus (HCC) Carbohydrate modified diet. -resume home meds  HTN (hypertension) Continue Cardizem 240 mg p.o. daily. Monitor blood pressure and heart rate.  Chronic kidney disease (CKD), stage IIIa Improved with IVF  Mild elevation of liver enzymes -AST/ALT trending down -outpatient follow up - RUQ u/s w/o stones of signs of infection -bilirubin normal -avoid hypotension    Medical Consultants:      Discharge Exam:   Vitals:   07/14/20 0433 07/14/20 0939  BP: 130/72 (!) 143/75  Pulse: 94 84  Resp: 17 18  Temp: 98.7 F (37.1 C) 98.6 F (37 C)  SpO2: 97% 95%   Vitals:   07/13/20 1704 07/13/20 2052 07/14/20 0433 07/14/20 0939  BP: 136/74 138/66 130/72 (!) 143/75  Pulse: 85 94 94 84  Resp: 18 17 17 18   Temp: 97.7 F (36.5 C) 99.3 F (37.4 C) 98.7 F (37.1 C) 98.6 F (37 C)  TempSrc: Oral  Oral Oral  SpO2: 98% 97% 97% 95%    General exam: Appears calm and comfortable.    The results of significant diagnostics from this hospitalization (including imaging, microbiology, ancillary and laboratory) are listed below for reference.     Procedures and Diagnostic Studies:   DG Chest Portable 1 View  Result Date: 07/11/2020 CLINICAL DATA:  Shortness of breath, cough. EXAM: PORTABLE CHEST 1 VIEW COMPARISON:  September 15, 2019. FINDINGS: Stable cardiomegaly. No pneumothorax or pleural  effusion is noted. Lungs are clear. Bony thorax is unremarkable. IMPRESSION: No active disease. Aortic Atherosclerosis (ICD10-I70.0). Electronically Signed   By: Marijo Conception M.D.   On: 07/11/2020 09:25     Labs:   Basic Metabolic Panel: Recent Labs  Lab 07/11/20 0903 07/11/20 0903 07/12/20 0445 07/12/20 0445  07/13/20 0443 07/14/20 0424  NA 134*  --  137  --  138 140  K 3.4*   < > 3.4*   < > 3.7 3.1*  CL 96*  --  102  --  103 103  CO2 25  --  25  --  24 26  GLUCOSE 175*  --  156*  --  171* 129*  BUN 24*  --  21  --  16 12  CREATININE 1.39*  --  1.05*  --  0.96 1.02*  CALCIUM 8.8*  --  8.2*  --  8.6* 8.7*  MG  --   --  1.4*  --   --   --   PHOS  --   --  2.7  --   --   --    < > = values in this interval not displayed.   GFR CrCl cannot be calculated (Unknown ideal weight.). Liver Function Tests: Recent Labs  Lab 07/11/20 0903 07/12/20 0445 07/13/20 0443 07/14/20 0424  AST 57* 82* 123* 79*  ALT 38 63* 99* 81*  ALKPHOS 46 45 62 51  BILITOT 0.8 0.9 0.8 0.4  PROT 6.2* 5.4* 5.8* 5.6*  ALBUMIN 2.8* 2.2* 2.2* 2.1*   No results for input(s): LIPASE, AMYLASE in the last 168 hours. No results for input(s): AMMONIA in the last 168 hours. Coagulation profile No results for input(s): INR, PROTIME in the last 168 hours.  CBC: Recent Labs  Lab 07/11/20 0903 07/13/20 0443 07/14/20 0424  WBC 10.8* 9.5 9.5  NEUTROABS 7.9*  --   --   HGB 11.7* 11.3* 10.2*  HCT 35.5* 34.0* 30.7*  MCV 94.9 95.2 94.5  PLT 135* 131* 148*   Cardiac Enzymes: No results for input(s): CKTOTAL, CKMB, CKMBINDEX, TROPONINI in the last 168 hours. BNP: Invalid input(s): POCBNP CBG: Recent Labs  Lab 07/13/20 1118 07/13/20 1700 07/13/20 2101 07/14/20 0641 07/14/20 1142  GLUCAP 103* 121* 155* 128* 168*   D-Dimer No results for input(s): DDIMER in the last 72 hours. Hgb A1c Recent Labs    07/12/20 0445  HGBA1C 6.3*   Lipid Profile No results for input(s): CHOL, HDL, LDLCALC, TRIG, CHOLHDL, LDLDIRECT in the last 72 hours. Thyroid function studies No results for input(s): TSH, T4TOTAL, T3FREE, THYROIDAB in the last 72 hours.  Invalid input(s): FREET3 Anemia work up No results for input(s): VITAMINB12, FOLATE, FERRITIN, TIBC, IRON, RETICCTPCT in the last 72 hours. Microbiology Recent Results (from  the past 240 hour(s))  SARS Coronavirus 2 by RT PCR (hospital order, performed in [facility] hospital lab) Nasopharyngeal Nasopharyngeal Swab     Status: None   Collection Time: 07/11/20 11:38 AM   Specimen: Nasopharyngeal Swab  Result Value Ref Range Status   SARS Coronavirus 2 NEGATIVE NEGATIVE Final    Comment: (NOTE) SARS-CoV-2 target nucleic acids are NOT DETECTED.  The SARS-CoV-2 RNA is generally detectable in upper and lower respiratory specimens during the acute phase of infection. The lowest concentration of SARS-CoV-2 viral copies this assay can detect is 250 copies / mL. A negative result does not preclude SARS-CoV-2 infection and should not be used as the sole basis for treatment or other patient  management decisions.  A negative result may occur with improper specimen collection / handling, submission of specimen other than nasopharyngeal swab, presence of viral mutation(s) within the areas targeted by this assay, and inadequate number of viral copies (<250 copies / mL). A negative result must be combined with clinical observations, patient history, and epidemiological information.  Fact Sheet for Patients:   StrictlyIdeas.no  Fact Sheet for Healthcare Providers: BankingDealers.co.za  This test is not yet approved or  cleared by the [place] FDA and has been authorized for detection and/or diagnosis of SARS-CoV-2 by FDA under an Emergency Use Authorization (EUA).  This EUA will remain in effect (meaning this test can be used) for the duration of the COVID-19 declaration under Section 564(b)(1) of the Act, 21 U.S.C. section 360bbb-3(b)(1), unless the authorization is terminated or revoked sooner.  Performed at [facility], [place] [address], [place], [place] [address]   Urine Culture     Status: Abnormal   Collection Time: 07/11/20  8:17 PM   Specimen: Urine, Clean Catch  Result Value Ref Range Status    Specimen Description URINE, CLEAN CATCH  Final   Special Requests   Final    NONE Performed at [facility], [place] [address] [place], [place] [address]    Culture >=100,000 COLONIES/mL ESCHERICHIA COLI (A)  Final   Report Status 07/13/2020 FINAL  Final   Organism ID, Bacteria ESCHERICHIA COLI (A)  Final      Susceptibility   Escherichia coli - MIC*    AMPICILLIN 4 SENSITIVE Sensitive     CEFAZOLIN <=4 SENSITIVE Sensitive     CEFTRIAXONE <=0.25 SENSITIVE Sensitive     CIPROFLOXACIN <=0.25 SENSITIVE Sensitive     GENTAMICIN <=1 SENSITIVE Sensitive     IMIPENEM <=0.25 SENSITIVE Sensitive     NITROFURANTOIN <=16 SENSITIVE Sensitive     TRIMETH/SULFA <=20 SENSITIVE Sensitive     AMPICILLIN/SULBACTAM <=2 SENSITIVE Sensitive     PIP/TAZO <=4 SENSITIVE Sensitive     * >=100,000 COLONIES/mL ESCHERICHIA COLI  Culture, blood (Routine X 2) w Reflex to ID Panel     Status: None (Preliminary result)   Collection Time: 07/12/20 10:18 AM   Specimen: BLOOD  Result Value Ref Range Status   Specimen Description BLOOD SITE NOT SPECIFIED  Final   Special Requests   Final    BOTTLES DRAWN AEROBIC AND ANAEROBIC Blood Culture adequate volume   Culture   Final    NO GROWTH 2 DAYS Performed at [facility], [address], [place], [place] [address]    Report Status PENDING  Incomplete  Culture, blood (Routine X 2) w Reflex to ID Panel     Status: None (Preliminary result)   Collection Time: 07/12/20 10:32 AM   Specimen: BLOOD  Result Value Ref Range Status   Specimen Description BLOOD SITE NOT SPECIFIED  Final   Special Requests   Final    BOTTLES DRAWN AEROBIC AND ANAEROBIC Blood Culture adequate volume   Culture   Final    NO GROWTH 2 DAYS Performed at [facility], [place] [address], [place], [place] [address]    Report Status PENDING  Incomplete     Discharge Instructions:   Discharge Instructions    Diet - low sodium heart healthy   Complete by: As directed    Diet  Carb Modified   Complete by: As directed    Discharge instructions   Complete by: As directed    Would  buy a PPI or H2 blocker over the counter for stomach pain (pepcid or prilosec), if not improved/resolved, will need to consider HIDA scan to evaluate gallbladder or EGD to look at stomach lining.   Increase activity slowly   Complete by: As directed      Allergies as of 07/14/2020   No Known Allergies     Medication List    STOP taking these medications   hydrochlorothiazide 12.5 MG capsule Commonly known as: MICROZIDE     TAKE these medications   allopurinol 100 MG tablet Commonly known as: ZYLOPRIM Take 1 tablet (100 mg total) by mouth daily.   apixaban 5 MG Tabs tablet Commonly known as: ELIQUIS Take 1 tablet (5 mg total) by mouth 2 (two) times daily.   cephALEXin 500 MG capsule Commonly known as: KEFLEX Take 1 capsule (500 mg total) by mouth every 12 (twelve) hours.   cholecalciferol 25 MCG (1000 UNIT) tablet Commonly known as: VITAMIN D3 Take 1,000 Units by mouth daily.   colchicine 0.6 MG tablet Take 1 tablet (0.6 mg total) by mouth daily.   diltiazem 240 MG 24 hr capsule Commonly known as: CARDIZEM CD TAKE 1 CAPSULE(240 MG) BY MOUTH DAILY What changed: See the new instructions.   ICAPS AREDS 2 PO Take 1 tablet by mouth daily.   magnesium oxide 400 (241.3 Mg) MG tablet Commonly known as: MAG-OX Take 1 tablet (400 mg total) by mouth daily. Start taking on: July 15, 2020   metFORMIN 750 MG 24 hr tablet Commonly known as: GLUCOPHAGE-XR Take 750 mg by mouth daily after supper.            Durable Medical Equipment  (From admission, onward)         Start     Ordered   07/14/20 1127  For home use only DME 3 n 1  Once        07/14/20 1127          Follow-up Information    Lucianne Lei, MD Follow up in 1 week(s).   Specialty: Family Medicine Why: bmp Contact information: [address] [place] [address] [phone]                 Time coordinating discharge: 35 min  Signed:  Geradine Girt DO  [facility] 07/14/2020, 3:23 PM

## 2020-07-14 NOTE — Plan of Care (Signed)
  Problem: Activity: Goal: Risk for activity intolerance will decrease Outcome: Progressing   

## 2020-07-17 DIAGNOSIS — E1100 Type 2 diabetes mellitus with hyperosmolarity without nonketotic hyperglycemic-hyperosmolar coma (NKHHC): Secondary | ICD-10-CM | POA: Diagnosis not present

## 2020-07-17 DIAGNOSIS — I4811 Longstanding persistent atrial fibrillation: Secondary | ICD-10-CM | POA: Diagnosis not present

## 2020-07-17 DIAGNOSIS — I10 Essential (primary) hypertension: Secondary | ICD-10-CM | POA: Diagnosis not present

## 2020-07-17 LAB — CULTURE, BLOOD (ROUTINE X 2)
Culture: NO GROWTH
Culture: NO GROWTH
Special Requests: ADEQUATE
Special Requests: ADEQUATE

## 2020-07-25 DIAGNOSIS — K219 Gastro-esophageal reflux disease without esophagitis: Secondary | ICD-10-CM | POA: Diagnosis not present

## 2020-07-25 DIAGNOSIS — N390 Urinary tract infection, site not specified: Secondary | ICD-10-CM | POA: Diagnosis not present

## 2020-07-25 DIAGNOSIS — I10 Essential (primary) hypertension: Secondary | ICD-10-CM | POA: Diagnosis not present

## 2020-08-01 DIAGNOSIS — H353211 Exudative age-related macular degeneration, right eye, with active choroidal neovascularization: Secondary | ICD-10-CM | POA: Diagnosis not present

## 2020-08-19 ENCOUNTER — Other Ambulatory Visit: Payer: Self-pay | Admitting: Cardiovascular Disease

## 2020-08-21 NOTE — Progress Notes (Signed)
Cardiology Office Note   Date:  09/03/2020   ID:  DIM MEISINGER, DOB 1933/04/18, MRN [id number]  PCP:  Lucianne Lei, MD  Cardiologist:   Jenkins Rouge, MD   No chief complaint on file.     History of Present Illness: Anne Roach is a 86 y.o. female who presents for post hospital f/u afib. 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 Lots of swelling in ankles and feet. Actually  Looks more like lymphedema.    D/c from hospital 07/14/20 abdominal pain Rx for urosepsis ecoli with elevated AST/ALT [place] stones no signs of obstruction D/c on Keflex Bilirubin normal   She has chronic dizziness. She was dizzy during my interview and afib rate and BP were fine No nystagmus She has seen ENT. Daughter wanted to try and stop diuretic and see if that helps   Past Medical History:  Diagnosis Date  . DM (diabetes mellitus) ([place])   . HTN (hypertension)   . Tremor     Past Surgical History:  Procedure Laterality Date  . APPENDECTOMY    . CARPAL TUNNEL RELEASE    . CATARACT EXTRACTION Bilateral   . HERNIA REPAIR       Current Outpatient Medications  Medication Sig Dispense Refill  . allopurinol (ZYLOPRIM) 100 MG tablet Take 1 tablet (100 mg total) by mouth daily. 90 tablet 2  . cholecalciferol (VITAMIN D3) 25 MCG (1000 UNIT) tablet Take 1,000 Units by mouth daily.    . colchicine 0.6 MG tablet Take 1 tablet (0.6 mg total) by mouth daily. 20 tablet 2  . diltiazem (CARDIZEM CD) 240 MG 24 hr capsule TAKE 1 CAPSULE(240 MG) BY MOUTH DAILY 90 capsule 0  . ELIQUIS 5 MG TABS tablet TAKE 1 TABLET(5 MG) BY MOUTH TWICE DAILY 60 tablet 0  . hydrochlorothiazide  (HYDRODIURIL) 12.5 MG tablet Take 12.5 mg by mouth daily.    . magnesium oxide (MAG-OX) 400 (241.3 Mg) MG tablet Take 1 tablet (400 mg total) by mouth daily. 30 tablet 0  . metFORMIN (GLUCOPHAGE-XR) 750 MG 24 hr tablet Take 750 mg by mouth daily after supper.  2  . Multiple Vitamins-Minerals (ICAPS AREDS 2 PO) Take 1 tablet by mouth daily.    . pantoprazole (PROTONIX) 40 MG tablet as needed.      No current facility-administered medications for this visit.    Allergies:   Patient has no known allergies.    Social History:  The patient  reports that she has never smoked. She has never used smokeless tobacco. She reports that she does not drink alcohol and does not use drugs.   Family History:  The patient's family history includes Asthma in her brother; Diabetes in her father.    ROS:  Please see the history of present illness.   Otherwise, review of systems are positive for none.   All other systems are reviewed and negative.    PHYSICAL EXAM: VS:  BP 126/74   Pulse 74   Ht 5\' 6"  (1.676 m)   Wt 152 lb 12.8 oz (69.3 kg)   SpO2 98%   BMI 24.66 kg/m  , BMI Body mass  index is 24.66 kg/m. Affect appropriate Frail elderly female  HEENT: normal Neck supple with no adenopathy JVP normal no bruits no thyromegaly Lungs clear with no wheezing and good diaphragmatic motion Heart:  S1/S2 no murmur, no rub, gallop or click PMI normal Abdomen: benighn, BS positve, no tenderness, no AAA no bruit.  No HSM or HJR Distal pulses intact with no bruits Lymphedema in both legs  Neuro non-focal Skin warm and dry No muscular weakness    EKG:  afib nonspecific ST changes 01/21/19 rate 65   Recent Labs: 07/12/2020: Magnesium 1.4 07/14/2020: ALT 81; BUN 12; Creatinine, Ser 1.02; Hemoglobin 10.2; Platelets 148; Potassium 3.1; Sodium 140    Lipid Panel No results found for: CHOL, TRIG, HDL, CHOLHDL, VLDL, LDLCALC, LDLDIRECT    Wt Readings from Last 3 Encounters:  09/03/20 152 lb 12.8 oz  (69.3 kg)  08/18/19 177 lb (80.3 kg)  01/21/19 168 lb 12.8 oz (76.6 kg)      Other studies Reviewed: Additional studies/ records that were reviewed today include: notes from hospital labs, CXR, ECG TTE.    ASSESSMENT AND PLAN:  1.  Afib:  Rate control with cardizem and anticoagulation with eliquis Echo benign no indication for [facility] as she is asymptomatic  2.  DM:  Discussed low carb diet.  Target hemoglobin A1c is 6.5 or less.  Continue current medications. 3. HTN: Well controlled.  Continue current medications and low sodium Dash type diet.   4. Dementia:  Continue [place] f/u neurology .  5. GI/GU:  Recent urosepsis with gallstones fever Rx antibiotics   6. Dizziness: may be vestibular. Meclizine called in [place] to try trial of no diuretic for 2 weeks and see if this helps Although her volume status seems normal   Current medicines are reviewed at length with the patient today.  The patient does not have concerns regarding medicines.  The following changes have been made:  Meclizine 25 tid as needed for dizziness   Labs/ tests ordered today include:  None  No orders of the defined types were placed in this encounter.    Disposition:   FU with cardiology in 6 months      Signed, Jenkins Rouge, MD  09/03/2020 2:21 PM    [place], [place], [place]  [address] Phone: [phone]; Fax: [phone]

## 2020-08-21 NOTE — Telephone Encounter (Signed)
Prescription refill request for Eliquis received.  Last office visit: 08/18/2019, Nishan Scr: 1.02, 07/14/2020 Age: 86 y.o. Weight: 80.3 kg  Appt scheduled 09/03/2020 with Dr. Johnsie Cancel. Prescription refill sent.

## 2020-08-22 DIAGNOSIS — H353221 Exudative age-related macular degeneration, left eye, with active choroidal neovascularization: Secondary | ICD-10-CM | POA: Diagnosis not present

## 2020-09-03 ENCOUNTER — Encounter: Payer: Self-pay | Admitting: Cardiovascular Disease

## 2020-09-03 ENCOUNTER — Other Ambulatory Visit: Payer: Self-pay

## 2020-09-03 ENCOUNTER — Ambulatory Visit (INDEPENDENT_AMBULATORY_CARE_PROVIDER_SITE_OTHER): Payer: Medicare HMO | Admitting: Cardiovascular Disease

## 2020-09-03 VITALS — BP 126/74 | HR 74 | Ht 66.0 in | Wt 152.8 lb

## 2020-09-03 DIAGNOSIS — I482 Chronic atrial fibrillation, unspecified: Secondary | ICD-10-CM | POA: Diagnosis not present

## 2020-09-03 MED ORDER — MECLIZINE HCL 25 MG PO TABS: 25.0000 mg | ORAL_TABLET | Freq: Three times a day (TID) | ORAL | 11 refills | Status: DC | PRN

## 2020-09-03 NOTE — Patient Instructions (Addendum)
Medication Instructions:  Your physician has recommended you make the following change in your medication:  1-START meclizine 25 mg by mouth three times daily as needed for dizziness.  *If you need a refill on your cardiac medications before your next appointment, please call your pharmacy*  Lab Work: If you have labs (blood work) drawn today and your tests are completely normal, you will receive your results only by: Marland Kitchen MyChart Message (if you have MyChart) OR . A paper copy in the mail If you have any lab test that is abnormal or we need to change your treatment, we will call you to review the results.  Testing/Procedures: None ordered today.  Follow-Up: At [facility], you and your health needs are our priority.  As part of our continuing mission to provide you with exceptional heart care, we have created designated Provider Care Teams.  These Care Teams include your primary Cardiologist (physician) and Advanced Practice Providers (APPs -  Physician Assistants and Nurse Practitioners) who all work together to provide you with the care you need, when you need it.  We recommend signing up for the patient portal called "MyChart".  Sign up information is provided on this After Visit Summary.  MyChart is used to connect with patients for Virtual Visits (Telemedicine).  Patients are able to view lab/test results, encounter notes, upcoming appointments, etc.  Non-urgent messages can be sent to your provider as well.   To learn more about what you can do with MyChart, go to [email or web address].    Your next appointment:   6 month(s)  The format for your next appointment:   In Person  Provider:   You may see Dr. Johnsie Cancel or one of the following Advanced Practice Providers on your designated Care Team:    Truitt Merle, NP  Cecilie Kicks, NP  Kathyrn Drown, NP

## 2020-09-08 ENCOUNTER — Encounter (HOSPITAL_COMMUNITY): Payer: Self-pay

## 2020-09-08 ENCOUNTER — Ambulatory Visit (HOSPITAL_COMMUNITY)
Admission: EM | Admit: 2020-09-08 | Discharge: 2020-09-08 | Disposition: A | Discharge status: Home / Self Care | Payer: Medicare HMO

## 2020-09-08 ENCOUNTER — Other Ambulatory Visit: Payer: Self-pay

## 2020-09-08 DIAGNOSIS — M79645 Pain in left finger(s): Secondary | ICD-10-CM | POA: Diagnosis not present

## 2020-09-08 DIAGNOSIS — T148XXA Other injury of unspecified body region, initial encounter: Secondary | ICD-10-CM

## 2020-09-08 NOTE — ED Provider Notes (Signed)
[facility]    CSN: [id number] Arrival date & time: 09/08/20  1229      History   Chief Complaint Chief Complaint  Patient presents with  . Toe Injury  . Finger Infection    HPI Anne Roach is a 86 y.o. female.   Patient presenting today with two complaints - blood blister on right great toe medially that has been causing pain and pain at nail edge on left middle finger after her nail was cut too short. Does not recall an injury to toe, and states the joint is mobile and not swollen. Has not been trying anything OTC for these issues thus far. Denies fever, chills, redness, drainage from either site. PMHx pertinent for DM2 and atrial fibrillation on eliquis.     Past Medical History:  Diagnosis Date  . DM (diabetes mellitus) ([place])   . HTN (hypertension)   . Tremor     Patient Active Problem List   Diagnosis Date Noted  . Sepsis secondary to UTI ([place]) 07/11/2020  . Chronic atrial fibrillation ([place]) 07/11/2020  . Hypervolemia   . Campylobacter gastrointestinal tract infection 01/03/2019  . Chronic kidney disease (CKD), stage III (moderate) 01/02/2019  . AKI (acute kidney injury) ([place]) 01/02/2019  . Hypovolemia with active loss of fluid 01/02/2019  . Hypokalemia 01/02/2019  . Hypomagnesemia 01/02/2019  . Hypoalbuminemia 01/02/2019  . Bandemia without diagnosis of specific infection 01/02/2019  . Hypotension 01/02/2019  . Fatigue   . Sepsis ([place])   . Bacteria in urine 01/01/2019  . Atrial fibrillation with RVR ([place]) 03/20/2017  . Myogenic ptosis of bilateral eyelids 06/24/2016  . HTN (hypertension)   . Tremor   . Exudative age-related macular degeneration of right eye with active choroidal neovascularization ([place]) 08/29/2014  . Pseudophakia of both eyes 08/29/2014  . OSA (obstructive sleep apnea) 06/29/2012  . Type 2 diabetes mellitus ([place])   . Incidental lung nodule 03/10/2009  . Other diseases of lung, not elsewhere classified 03/10/2009     Past Surgical History:  Procedure Laterality Date  . APPENDECTOMY    . CARPAL TUNNEL RELEASE    . CATARACT EXTRACTION Bilateral   . HERNIA REPAIR      OB History   No obstetric history on file.      Home Medications    Prior to Admission medications   Medication Sig Start Date End Date Taking? Authorizing Provider  allopurinol (ZYLOPRIM) 100 MG tablet Take 1 tablet (100 mg total) by mouth daily. 03/14/20   Edrick Kins, DPM  cholecalciferol (VITAMIN D3) 25 MCG (1000 UNIT) tablet Take 1,000 Units by mouth daily.    [provider]  colchicine 0.6 MG tablet Take 1 tablet (0.6 mg total) by mouth daily. 04/10/20   Edrick Kins, DPM  diltiazem (CARDIZEM CD) 240 MG 24 hr capsule TAKE 1 CAPSULE(240 MG) BY MOUTH DAILY 08/21/20   Josue Hector, MD  ELIQUIS 5 MG TABS tablet TAKE 1 TABLET(5 MG) BY MOUTH TWICE DAILY 08/21/20   Josue Hector, MD  hydrochlorothiazide (HYDRODIURIL) 12.5 MG tablet Take 12.5 mg by mouth daily.    [provider]  magnesium oxide (MAG-OX) 400 (241.3 Mg) MG tablet Take 1 tablet (400 mg total) by mouth daily. 07/15/20   Geradine Girt, DO  meclizine (ANTIVERT) 25 MG tablet Take 1 tablet (25 mg total) by mouth 3 (three) times daily as needed for dizziness. 09/03/20   Josue Hector, MD  metFORMIN (GLUCOPHAGE-XR) 750 MG 24 hr  tablet Take 750 mg by mouth daily after supper. 05/14/15   [provider]  Multiple Vitamins-Minerals (ICAPS AREDS 2 PO) Take 1 tablet by mouth daily.    [provider]  pantoprazole (PROTONIX) 40 MG tablet as needed.  08/22/20   [provider]    Family History Family History  Problem Relation Age of Onset  . Diabetes Father   . Asthma Brother     Social History Social History   Tobacco Use  . Smoking status: Never Smoker  . Smokeless tobacco: Never Used  Substance Use Topics  . Alcohol use: No    Alcohol/week: 0.0 standard drinks  . Drug use: No     Allergies   Patient has  no known allergies.   Review of Systems Review of Systems PER HPI    Physical Exam Triage Vital Signs ED Triage Vitals  Enc Vitals Group     BP 09/08/20 1315 102/67     Pulse Rate 09/08/20 1315 88     Resp 09/08/20 1315 16     Temp 09/08/20 1315 98.1 F (36.7 C)     Temp Source 09/08/20 1315 Oral     SpO2 09/08/20 1315 97 %     Weight --      Height --      Head Circumference --      Peak Flow --      Pain Score 09/08/20 1313 7     Pain Loc --      Pain Edu? --      Excl. in [place]? --    No data found.  Updated Vital Signs BP 102/67 (BP Location: Right Arm)   Pulse 88   Temp 98.1 F (36.7 C) (Oral)   Resp 16   SpO2 97%   Visual Acuity Right Eye Distance:   Left Eye Distance:   Bilateral Distance:    Right Eye Near:   Left Eye Near:    Bilateral Near:     Physical Exam Vitals and nursing note reviewed.  Constitutional:      Appearance: Normal appearance. She is not ill-appearing.  HENT:     Head: Atraumatic.  Eyes:     Extraocular Movements: Extraocular movements intact.     Conjunctiva/sclera: Conjunctivae normal.  Cardiovascular:     Rate and Rhythm: Normal rate and regular rhythm.     Heart sounds: Normal heart sounds.  Pulmonary:     Effort: Pulmonary effort is normal.     Breath sounds: Normal breath sounds.  Musculoskeletal:        General: Normal range of motion.     Cervical back: Normal range of motion and neck supple.     Comments: Good rom in joints, no joint discoloration or edema on exam of either location  Skin:    General: Skin is warm and dry.     Comments: 1.5 cm blister medial right great toe, fluctuant, tender to palpation Mild erythema and edema of nail edge left middle fingernail, no abscess or drainage present  Neurological:     Mental Status: She is alert and oriented to person, place, and time.  Psychiatric:        Mood and Affect: Mood normal.        Thought Content: Thought content normal.        Judgment: Judgment  normal.      UC Treatments / Results  Labs (all labs ordered are listed, but only abnormal results are displayed) Labs Reviewed -  No data to display  EKG   Radiology No results found.  Procedures Procedures (including critical care time)  Aspiration of blister, right great toe Procedure explained, patient questions answered adequately. She is agreeable to proceeding. Area cleaned with betadine and aspirated with sterile needle. Trace amounts of sanguinous fluid expressed. Cleaned again with alcohol pad and dressed with neosporin and bandage. WOund care reviewed. Procedure tolerated well.     Medications Ordered in UC Medications - No data to display  Initial Impression / Assessment and Plan / UC Course  I have reviewed the triage vital signs and the nursing notes.  Pertinent labs & imaging results that were available during my care of the patient were reviewed by me and considered in my medical decision making (see chart for details).     Blister aspirated today per patient request, which was well tolerated. Dressed both areas in bacitracin and a bandage, discussed home wound care and return precautions. Finger is not currently infected but discussed aggressive cleansing regimen and keeping covered with neosporin to avoid paronychia formation. OTC pain relievers prn.   Final Clinical Impressions(s) / UC Diagnoses   Final diagnoses:  Blood blister  Finger pain, left     Discharge Instructions     Warm epsom salt soaks to both areas, clean with antibacterial soap and water or hibiclens solution twice daily, keep covered with neosporin and bandages, monitor for signs of infection    ED Prescriptions    None     PDMP not reviewed this encounter.   Volney American, [place] 09/08/20 1451

## 2020-09-08 NOTE — ED Triage Notes (Signed)
Pt presents with blood blister on right great toe for past few days and can not remember any injury.  Pt also has throbbing pain in left middle finger after getting her nails cut a few days ago.

## 2020-09-08 NOTE — Discharge Instructions (Signed)
Warm epsom salt soaks to both areas, clean with antibacterial soap and water or hibiclens solution twice daily, keep covered with neosporin and bandages, monitor for signs of infection

## 2020-09-15 DIAGNOSIS — E1100 Type 2 diabetes mellitus with hyperosmolarity without nonketotic hyperglycemic-hyperosmolar coma (NKHHC): Secondary | ICD-10-CM | POA: Diagnosis not present

## 2020-09-15 DIAGNOSIS — I4811 Longstanding persistent atrial fibrillation: Secondary | ICD-10-CM | POA: Diagnosis not present

## 2020-09-15 DIAGNOSIS — I10 Essential (primary) hypertension: Secondary | ICD-10-CM | POA: Diagnosis not present

## 2020-09-18 ENCOUNTER — Other Ambulatory Visit: Payer: Self-pay | Admitting: Cardiovascular Disease

## 2020-09-18 NOTE — Telephone Encounter (Signed)
Pt last saw Dr Johnsie Cancel 09/03/20, last labs 07/14/20 Creat 1.02, age 86, weight 69.3kg, based on specified criteria pt is on appropriate dosage of Eliquis 5mg  BID.  Will refill rx.

## 2020-09-26 DIAGNOSIS — H353211 Exudative age-related macular degeneration, right eye, with active choroidal neovascularization: Secondary | ICD-10-CM | POA: Diagnosis not present

## 2020-10-17 ENCOUNTER — Other Ambulatory Visit: Payer: Self-pay

## 2020-10-17 ENCOUNTER — Ambulatory Visit (INDEPENDENT_AMBULATORY_CARE_PROVIDER_SITE_OTHER): Payer: Medicare HMO | Admitting: Podiatry

## 2020-10-17 DIAGNOSIS — M722 Plantar fascial fibromatosis: Secondary | ICD-10-CM

## 2020-10-17 DIAGNOSIS — M109 Gout, unspecified: Secondary | ICD-10-CM

## 2020-10-17 MED ORDER — GABAPENTIN 100 MG PO CAPS: 100.0000 mg | ORAL_CAPSULE | Freq: Three times a day (TID) | ORAL | 3 refills | Status: DC

## 2020-10-17 MED ORDER — COLCHICINE 0.6 MG PO TABS
0.6000 mg | ORAL_TABLET | Freq: Every day | ORAL | 2 refills | Status: DC
Start: 2020-10-17 — End: 2022-03-27

## 2020-10-17 MED ORDER — BETAMETHASONE SOD PHOS & ACET 6 (3-3) MG/ML IJ SUSP: 3.0000 mg | Freq: Once | INTRAMUSCULAR | Status: DC

## 2020-10-17 NOTE — Progress Notes (Signed)
   Subjective: 86 y.o. female presenting today for new complaint regarding recurrence of diffuse sharp, burning bilateral foot pain that began one week ago. She states the pain radiates up to her knees and is mostly at night time. Her daughter states she refuses to take Colchicine for the pain. There are no modifying factors noted. Patient is here for further evaluation and treatment.   Past Medical History:  Diagnosis Date  . DM (diabetes mellitus) ([place])   . HTN (hypertension)   . Tremor      Objective: Physical Exam General: The patient is alert and oriented x3 in no acute distress.  Dermatology: Skin is warm, dry and supple bilateral lower extremities. Negative for open lesions or macerations bilateral.   Vascular: Dorsalis Pedis and Posterior Tibial pulses palpable bilateral.  Capillary fill time is immediate to all digits.  Neurological: Epicritic and protective threshold intact bilateral.   Musculoskeletal: Tenderness to palpation to the plantar aspect of the bilateral heels along the plantar fascia. All other joints range of motion within normal limits bilateral. Strength 5/5 in all groups bilateral.   Assessment: 1. plantar fasciitis bilateral feet 2. Generalized foot pain bilateral 3. H/o chronic gout  Plan of Care:  1. Patient evaluated.  2. Injection of 0.5cc Celestone soluspan injected into the bilateral heels.  3. Refill prescription for Colcrys 0.6 mg provided to patient.  4.  Prescription for gabapentin 100 mg nightly as needed nocturnal foot pain 5.  Return to clinic as needed  Edrick Kins, DPM [facility]  Dr. Edrick Kins, DPM    [address], [place] [address]                Office [phone]  Fax [phone]

## 2020-10-18 DIAGNOSIS — H353221 Exudative age-related macular degeneration, left eye, with active choroidal neovascularization: Secondary | ICD-10-CM | POA: Diagnosis not present

## 2020-10-25 DIAGNOSIS — H26491 Other secondary cataract, right eye: Secondary | ICD-10-CM | POA: Diagnosis not present

## 2020-10-25 DIAGNOSIS — E119 Type 2 diabetes mellitus without complications: Secondary | ICD-10-CM | POA: Diagnosis not present

## 2020-10-25 DIAGNOSIS — H353211 Exudative age-related macular degeneration, right eye, with active choroidal neovascularization: Secondary | ICD-10-CM | POA: Diagnosis not present

## 2020-10-25 DIAGNOSIS — H353121 Nonexudative age-related macular degeneration, left eye, early dry stage: Secondary | ICD-10-CM | POA: Diagnosis not present

## 2020-10-31 ENCOUNTER — Emergency Department (HOSPITAL_COMMUNITY): Payer: Medicare HMO

## 2020-10-31 ENCOUNTER — Other Ambulatory Visit: Payer: Self-pay

## 2020-10-31 ENCOUNTER — Inpatient Hospital Stay (HOSPITAL_COMMUNITY)
Admission: EM | Admit: 2020-10-31 | Discharge: 2020-11-05 | DRG: 871 | Disposition: A | Discharge status: Home Health | Payer: Medicare HMO | Attending: Internal Medicine | Admitting: Internal Medicine

## 2020-10-31 ENCOUNTER — Encounter (HOSPITAL_COMMUNITY): Payer: Self-pay | Admitting: *Deleted

## 2020-10-31 ENCOUNTER — Inpatient Hospital Stay (HOSPITAL_COMMUNITY): Payer: Medicare HMO

## 2020-10-31 DIAGNOSIS — I4819 Other persistent atrial fibrillation: Secondary | ICD-10-CM | POA: Diagnosis not present

## 2020-10-31 DIAGNOSIS — E1159 Type 2 diabetes mellitus with other circulatory complications: Secondary | ICD-10-CM | POA: Diagnosis not present

## 2020-10-31 DIAGNOSIS — I5022 Chronic systolic (congestive) heart failure: Secondary | ICD-10-CM | POA: Diagnosis not present

## 2020-10-31 DIAGNOSIS — R6510 Systemic inflammatory response syndrome (SIRS) of non-infectious origin without acute organ dysfunction: Secondary | ICD-10-CM | POA: Diagnosis present

## 2020-10-31 DIAGNOSIS — I130 Hypertensive heart and chronic kidney disease with heart failure and stage 1 through stage 4 chronic kidney disease, or unspecified chronic kidney disease: Secondary | ICD-10-CM | POA: Diagnosis present

## 2020-10-31 DIAGNOSIS — N1831 Chronic kidney disease, stage 3a: Secondary | ICD-10-CM | POA: Diagnosis present

## 2020-10-31 DIAGNOSIS — L89311 Pressure ulcer of right buttock, stage 1: Secondary | ICD-10-CM | POA: Diagnosis present

## 2020-10-31 DIAGNOSIS — Z833 Family history of diabetes mellitus: Secondary | ICD-10-CM | POA: Diagnosis not present

## 2020-10-31 DIAGNOSIS — K5730 Diverticulosis of large intestine without perforation or abscess without bleeding: Secondary | ICD-10-CM | POA: Diagnosis not present

## 2020-10-31 DIAGNOSIS — R6520 Severe sepsis without septic shock: Secondary | ICD-10-CM | POA: Diagnosis present

## 2020-10-31 DIAGNOSIS — R0602 Shortness of breath: Secondary | ICD-10-CM | POA: Diagnosis not present

## 2020-10-31 DIAGNOSIS — I700 Atherosclerosis of aorta: Secondary | ICD-10-CM | POA: Diagnosis not present

## 2020-10-31 DIAGNOSIS — J811 Chronic pulmonary edema: Secondary | ICD-10-CM | POA: Diagnosis not present

## 2020-10-31 DIAGNOSIS — Z20822 Contact with and (suspected) exposure to covid-19: Secondary | ICD-10-CM | POA: Diagnosis not present

## 2020-10-31 DIAGNOSIS — E1122 Type 2 diabetes mellitus with diabetic chronic kidney disease: Secondary | ICD-10-CM | POA: Diagnosis not present

## 2020-10-31 DIAGNOSIS — I482 Chronic atrial fibrillation, unspecified: Secondary | ICD-10-CM | POA: Diagnosis present

## 2020-10-31 DIAGNOSIS — I160 Hypertensive urgency: Secondary | ICD-10-CM | POA: Diagnosis not present

## 2020-10-31 DIAGNOSIS — J918 Pleural effusion in other conditions classified elsewhere: Secondary | ICD-10-CM | POA: Diagnosis not present

## 2020-10-31 DIAGNOSIS — R0789 Other chest pain: Secondary | ICD-10-CM | POA: Diagnosis not present

## 2020-10-31 DIAGNOSIS — A419 Sepsis, unspecified organism: Secondary | ICD-10-CM | POA: Diagnosis not present

## 2020-10-31 DIAGNOSIS — Z79899 Other long term (current) drug therapy: Secondary | ICD-10-CM | POA: Diagnosis not present

## 2020-10-31 DIAGNOSIS — I5032 Chronic diastolic (congestive) heart failure: Secondary | ICD-10-CM | POA: Diagnosis not present

## 2020-10-31 DIAGNOSIS — R0902 Hypoxemia: Secondary | ICD-10-CM | POA: Diagnosis not present

## 2020-10-31 DIAGNOSIS — K8689 Other specified diseases of pancreas: Secondary | ICD-10-CM | POA: Diagnosis not present

## 2020-10-31 DIAGNOSIS — Z9049 Acquired absence of other specified parts of digestive tract: Secondary | ICD-10-CM

## 2020-10-31 DIAGNOSIS — R109 Unspecified abdominal pain: Secondary | ICD-10-CM | POA: Diagnosis present

## 2020-10-31 DIAGNOSIS — J189 Pneumonia, unspecified organism: Secondary | ICD-10-CM | POA: Diagnosis not present

## 2020-10-31 DIAGNOSIS — K219 Gastro-esophageal reflux disease without esophagitis: Secondary | ICD-10-CM | POA: Diagnosis present

## 2020-10-31 DIAGNOSIS — K5900 Constipation, unspecified: Secondary | ICD-10-CM | POA: Diagnosis not present

## 2020-10-31 DIAGNOSIS — Z7901 Long term (current) use of anticoagulants: Secondary | ICD-10-CM | POA: Diagnosis not present

## 2020-10-31 DIAGNOSIS — E785 Hyperlipidemia, unspecified: Secondary | ICD-10-CM | POA: Diagnosis present

## 2020-10-31 DIAGNOSIS — I110 Hypertensive heart disease with heart failure: Secondary | ICD-10-CM | POA: Diagnosis not present

## 2020-10-31 DIAGNOSIS — I5033 Acute on chronic diastolic (congestive) heart failure: Secondary | ICD-10-CM | POA: Diagnosis not present

## 2020-10-31 DIAGNOSIS — I351 Nonrheumatic aortic (valve) insufficiency: Secondary | ICD-10-CM | POA: Diagnosis not present

## 2020-10-31 DIAGNOSIS — R079 Chest pain, unspecified: Secondary | ICD-10-CM | POA: Diagnosis not present

## 2020-10-31 DIAGNOSIS — E119 Type 2 diabetes mellitus without complications: Secondary | ICD-10-CM

## 2020-10-31 DIAGNOSIS — M109 Gout, unspecified: Secondary | ICD-10-CM | POA: Diagnosis not present

## 2020-10-31 DIAGNOSIS — K529 Noninfective gastroenteritis and colitis, unspecified: Secondary | ICD-10-CM | POA: Diagnosis present

## 2020-10-31 DIAGNOSIS — Z7984 Long term (current) use of oral hypoglycemic drugs: Secondary | ICD-10-CM | POA: Diagnosis not present

## 2020-10-31 DIAGNOSIS — L89321 Pressure ulcer of left buttock, stage 1: Secondary | ICD-10-CM | POA: Diagnosis present

## 2020-10-31 DIAGNOSIS — N261 Atrophy of kidney (terminal): Secondary | ICD-10-CM | POA: Diagnosis not present

## 2020-10-31 DIAGNOSIS — R1013 Epigastric pain: Secondary | ICD-10-CM

## 2020-10-31 DIAGNOSIS — N189 Chronic kidney disease, unspecified: Secondary | ICD-10-CM | POA: Diagnosis not present

## 2020-10-31 DIAGNOSIS — I4891 Unspecified atrial fibrillation: Secondary | ICD-10-CM | POA: Diagnosis not present

## 2020-10-31 DIAGNOSIS — I10 Essential (primary) hypertension: Secondary | ICD-10-CM | POA: Diagnosis not present

## 2020-10-31 DIAGNOSIS — J9811 Atelectasis: Secondary | ICD-10-CM | POA: Diagnosis not present

## 2020-10-31 DIAGNOSIS — I499 Cardiac arrhythmia, unspecified: Secondary | ICD-10-CM | POA: Diagnosis not present

## 2020-10-31 DIAGNOSIS — I517 Cardiomegaly: Secondary | ICD-10-CM | POA: Diagnosis not present

## 2020-10-31 DIAGNOSIS — R9431 Abnormal electrocardiogram [ECG] [EKG]: Secondary | ICD-10-CM | POA: Diagnosis not present

## 2020-10-31 DIAGNOSIS — I361 Nonrheumatic tricuspid (valve) insufficiency: Secondary | ICD-10-CM | POA: Diagnosis not present

## 2020-10-31 DIAGNOSIS — J90 Pleural effusion, not elsewhere classified: Secondary | ICD-10-CM

## 2020-10-31 HISTORY — DX: Chronic diastolic (congestive) heart failure: I50.32

## 2020-10-31 HISTORY — DX: Pneumonia, unspecified organism: J18.9

## 2020-10-31 HISTORY — DX: Unspecified atrial fibrillation: I48.91

## 2020-10-31 LAB — TROPONIN I (HIGH SENSITIVITY)
Troponin I (High Sensitivity): 8 ng/L (ref <18)
Troponin I (High Sensitivity): 9 ng/L (ref <18)

## 2020-10-31 LAB — URINALYSIS, ROUTINE W REFLEX MICROSCOPIC
Bilirubin Urine: NEGATIVE
Glucose, UA: NEGATIVE mg/dL
Hgb urine dipstick: NEGATIVE
Ketones, ur: NEGATIVE mg/dL
Nitrite: NEGATIVE
Protein, ur: NEGATIVE mg/dL
Specific Gravity, Urine: 1.01 (ref 1.005–1.030)
pH: 6 (ref 5.0–8.0)

## 2020-10-31 LAB — COMPREHENSIVE METABOLIC PANEL
ALT: 15 U/L (ref 0–44)
AST: 22 U/L (ref 15–41)
Albumin: 3.5 g/dL (ref 3.5–5.0)
Alkaline Phosphatase: 43 U/L (ref 38–126)
Anion gap: 12 (ref 5–15)
BUN: 15 mg/dL (ref 8–23)
CO2: 24 mmol/L (ref 22–32)
Calcium: 9.5 mg/dL (ref 8.9–10.3)
Chloride: 102 mmol/L (ref 98–111)
Creatinine, Ser: 1.25 mg/dL — ABNORMAL HIGH (ref 0.44–1.00)
GFR, Estimated: 42 mL/min — ABNORMAL LOW (ref >60)
Glucose, Bld: 131 mg/dL — ABNORMAL HIGH (ref 70–99)
Potassium: 4.1 mmol/L (ref 3.5–5.1)
Sodium: 138 mmol/L (ref 135–145)
Total Bilirubin: 0.5 mg/dL (ref 0.3–1.2)
Total Protein: 7 g/dL (ref 6.5–8.1)

## 2020-10-31 LAB — CBC WITH DIFFERENTIAL/PLATELET
Abs Immature Granulocytes: 0.09 10*3/uL — ABNORMAL HIGH (ref 0.00–0.07)
Basophils Absolute: 0 10*3/uL (ref 0.0–0.1)
Basophils Relative: 0 %
Eosinophils Absolute: 0.1 10*3/uL (ref 0.0–0.5)
Eosinophils Relative: 0 %
HCT: 39.7 % (ref 36.0–46.0)
Hemoglobin: 12.6 g/dL (ref 12.0–15.0)
Immature Granulocytes: 1 %
Lymphocytes Relative: 11 %
Lymphs Abs: 1.9 10*3/uL (ref 0.7–4.0)
MCH: 31.2 pg (ref 26.0–34.0)
MCHC: 31.7 g/dL (ref 30.0–36.0)
MCV: 98.3 fL (ref 80.0–100.0)
Monocytes Absolute: 1 10*3/uL (ref 0.1–1.0)
Monocytes Relative: 6 %
Neutro Abs: 14.3 10*3/uL — ABNORMAL HIGH (ref 1.7–7.7)
Neutrophils Relative %: 82 %
Platelets: 186 10*3/uL (ref 150–400)
RBC: 4.04 MIL/uL (ref 3.87–5.11)
RDW: 14.7 % (ref 11.5–15.5)
WBC: 17.5 10*3/uL — ABNORMAL HIGH (ref 4.0–10.5)
nRBC: 0 % (ref 0.0–0.2)

## 2020-10-31 LAB — PROTIME-INR
INR: 1.4 — ABNORMAL HIGH (ref 0.8–1.2)
Prothrombin Time: 16.5 seconds — ABNORMAL HIGH (ref 11.4–15.2)

## 2020-10-31 LAB — CBG MONITORING, ED
Glucose-Capillary: 112 mg/dL — ABNORMAL HIGH (ref 70–99)
Glucose-Capillary: 93 mg/dL (ref 70–99)

## 2020-10-31 LAB — PROCALCITONIN: Procalcitonin: <0.1 ng/mL

## 2020-10-31 LAB — HEMOGLOBIN A1C
Hgb A1c MFr Bld: 5.9 % — ABNORMAL HIGH (ref 4.8–5.6)
Mean Plasma Glucose: 122.63 mg/dL

## 2020-10-31 LAB — BRAIN NATRIURETIC PEPTIDE: B Natriuretic Peptide: 408 pg/mL — ABNORMAL HIGH (ref 0.0–100.0)

## 2020-10-31 LAB — LACTIC ACID, PLASMA
Lactic Acid, Venous: 3.1 mmol/L (ref 0.5–1.9)
Lactic Acid, Venous: 2.8 mmol/L (ref 0.5–1.9)
Lactic Acid, Venous: 2.2 mmol/L (ref 0.5–1.9)

## 2020-10-31 LAB — RESP PANEL BY RT-PCR (FLU A&B, COVID) ARPGX2
Influenza A by PCR: NEGATIVE
Influenza B by PCR: NEGATIVE
SARS Coronavirus 2 by RT PCR: NEGATIVE

## 2020-10-31 LAB — APTT: aPTT: 39 seconds — ABNORMAL HIGH (ref 24–36)

## 2020-10-31 MED ORDER — ACETAMINOPHEN 325 MG PO TABS
650.0000 mg | ORAL_TABLET | Freq: Four times a day (QID) | ORAL | Status: DC | PRN
  Administered 2020-10-31 – 2020-11-03 (×5): 650 mg via ORAL
  Filled 2020-10-31 (×6): qty 2

## 2020-10-31 MED ORDER — DILTIAZEM HCL 25 MG/5ML IV SOLN
10.0000 mg | Freq: Once | INTRAVENOUS | Status: AC
  Administered 2020-10-31: 17:00:00 10 mg via INTRAVENOUS
  Filled 2020-10-31: qty 5

## 2020-10-31 MED ORDER — ONDANSETRON HCL 4 MG PO TABS: 4.0000 mg | ORAL_TABLET | Freq: Four times a day (QID) | ORAL | Status: DC | PRN

## 2020-10-31 MED ORDER — SODIUM CHLORIDE 0.9 % IV SOLN: Freq: Once | INTRAVENOUS | Status: AC

## 2020-10-31 MED ORDER — SODIUM CHLORIDE 0.9 % IV SOLN
2.0000 g | Freq: Two times a day (BID) | INTRAVENOUS | Status: DC
  Administered 2020-10-31 – 2020-11-01 (×2): 2 g via INTRAVENOUS
  Filled 2020-10-31 (×2): qty 2

## 2020-10-31 MED ORDER — GABAPENTIN 100 MG PO CAPS
100.0000 mg | ORAL_CAPSULE | Freq: Three times a day (TID) | ORAL | Status: DC
  Administered 2020-11-01 – 2020-11-05 (×10): 100 mg via ORAL
  Filled 2020-10-31 (×14): qty 1

## 2020-10-31 MED ORDER — SACCHAROMYCES BOULARDII 250 MG PO CAPS
250.0000 mg | ORAL_CAPSULE | Freq: Two times a day (BID) | ORAL | Status: DC
  Administered 2020-10-31 – 2020-11-05 (×10): 250 mg via ORAL
  Filled 2020-10-31 (×11): qty 1

## 2020-10-31 MED ORDER — PANTOPRAZOLE SODIUM 40 MG PO TBEC: 40.0000 mg | DELAYED_RELEASE_TABLET | Freq: Every day | ORAL | Status: DC | PRN

## 2020-10-31 MED ORDER — SODIUM CHLORIDE 0.9 % IV SOLN
2.0000 g | Freq: Once | INTRAVENOUS | Status: AC
  Administered 2020-10-31: 09:00:00 2 g via INTRAVENOUS
  Filled 2020-10-31: qty 2

## 2020-10-31 MED ORDER — VANCOMYCIN HCL 1500 MG/300ML IV SOLN
1500.0000 mg | Freq: Once | INTRAVENOUS | Status: AC
  Administered 2020-10-31: 11:00:00 1500 mg via INTRAVENOUS
  Filled 2020-10-31: qty 300

## 2020-10-31 MED ORDER — LACTATED RINGERS IV BOLUS (SEPSIS)
1000.0000 mL | Freq: Once | INTRAVENOUS | Status: AC
  Administered 2020-10-31: 09:00:00 1000 mL via INTRAVENOUS

## 2020-10-31 MED ORDER — ALLOPURINOL 100 MG PO TABS
100.0000 mg | ORAL_TABLET | Freq: Every day | ORAL | Status: DC
  Administered 2020-10-31 – 2020-11-05 (×6): 100 mg via ORAL
  Filled 2020-10-31 (×6): qty 1

## 2020-10-31 MED ORDER — APIXABAN 5 MG PO TABS
5.0000 mg | ORAL_TABLET | Freq: Two times a day (BID) | ORAL | Status: DC
  Administered 2020-10-31 – 2020-11-05 (×11): 5 mg via ORAL
  Filled 2020-10-31 (×11): qty 1

## 2020-10-31 MED ORDER — VANCOMYCIN HCL IN DEXTROSE 1-5 GM/200ML-% IV SOLN
1000.0000 mg | INTRAVENOUS | Status: DC
  Filled 2020-10-31: qty 200

## 2020-10-31 MED ORDER — ONDANSETRON HCL 4 MG/2ML IJ SOLN: 4.0000 mg | Freq: Four times a day (QID) | INTRAMUSCULAR | Status: DC | PRN

## 2020-10-31 MED ORDER — MAGNESIUM OXIDE 400 (241.3 MG) MG PO TABS
400.0000 mg | ORAL_TABLET | Freq: Every day | ORAL | Status: DC
  Administered 2020-10-31 – 2020-11-05 (×6): 400 mg via ORAL
  Filled 2020-10-31 (×6): qty 1

## 2020-10-31 MED ORDER — IOHEXOL 9 MG/ML PO SOLN
ORAL | Status: AC
  Filled 2020-10-31: qty 1000

## 2020-10-31 MED ORDER — INSULIN ASPART 100 UNIT/ML ~~LOC~~ SOLN: 0.0000 [IU] | Freq: Three times a day (TID) | SUBCUTANEOUS | Status: DC

## 2020-10-31 MED ORDER — VANCOMYCIN HCL IN DEXTROSE 1-5 GM/200ML-% IV SOLN: 1000.0000 mg | Freq: Once | INTRAVENOUS | Status: DC

## 2020-10-31 MED ORDER — ACETAMINOPHEN 650 MG RE SUPP: 650.0000 mg | Freq: Four times a day (QID) | RECTAL | Status: DC | PRN

## 2020-10-31 MED ORDER — METRONIDAZOLE 500 MG PO TABS
500.0000 mg | ORAL_TABLET | Freq: Three times a day (TID) | ORAL | Status: DC
  Administered 2020-10-31 – 2020-11-01 (×4): 500 mg via ORAL
  Filled 2020-10-31 (×4): qty 1

## 2020-10-31 MED ORDER — LACTATED RINGERS IV BOLUS (SEPSIS)
1000.0000 mL | Freq: Once | INTRAVENOUS | Status: AC
  Administered 2020-10-31: 11:00:00 1000 mL via INTRAVENOUS

## 2020-10-31 MED ORDER — LACTATED RINGERS IV SOLN: INTRAVENOUS | Status: DC

## 2020-10-31 MED ORDER — ALBUTEROL SULFATE (2.5 MG/3ML) 0.083% IN NEBU: 2.5000 mg | INHALATION_SOLUTION | Freq: Four times a day (QID) | RESPIRATORY_TRACT | Status: DC | PRN

## 2020-10-31 MED ORDER — DILTIAZEM HCL ER COATED BEADS 120 MG PO CP24
240.0000 mg | ORAL_CAPSULE | Freq: Every day | ORAL | Status: DC
  Administered 2020-11-01 – 2020-11-05 (×5): 240 mg via ORAL
  Filled 2020-10-31 (×5): qty 2

## 2020-10-31 NOTE — Sepsis Progress Note (Signed)
Sepsis Protocol being followed by eLink

## 2020-10-31 NOTE — Progress Notes (Addendum)
Pharmacy Antibiotic Note  Anne Roach is a 86 y.o. female admitted on 10/31/2020 with chest pain, vomiting, Afib with RVR. Pharmacy has been consulted for vancomycin and cefepime dosing for sepsis - source unknown. Afebrile. WBC 17.5. Scr 2.25 with current CrCl of ~31 ml/min.    Plan: Cefepime 2g x1 followed by 2g q12h Vancomycin 1500mg  x1 followed by 1000mg  q24h Monitor renal function, cultures/sensitivities, and clinical progression   Height: 5\' 6"  (167.6 cm) Weight: 69.3 kg (152 lb 12.5 oz) IBW/kg (Calculated) : 59.3  Temp (24hrs), Avg:98.4 F (36.9 C), Min:98.4 F (36.9 C), Max:98.4 F (36.9 C)  No results for input(s): WBC, CREATININE, LATICACIDVEN, VANCOTROUGH, VANCOPEAK, VANCORANDOM, GENTTROUGH, GENTPEAK, GENTRANDOM, TOBRATROUGH, TOBRAPEAK, TOBRARND, AMIKACINPEAK, AMIKACINTROU, AMIKACIN in the last 168 hours.  CrCl cannot be calculated (Patient's most recent lab result is older than the maximum 21 days allowed.).    No Known Allergies  Antimicrobials this admission: Vancomycin 12/15 >>  Cefepime 12/15 >>   Dose adjustments this admission: N/a  Microbiology results: 12/15 bcx:  12/15 ucx:   Thank you for allowing pharmacy to be a part of this patient's care.  Cristela Felt, PharmD Clinical Pharmacist  10/31/2020 8:23 AM

## 2020-10-31 NOTE — ED Notes (Signed)
Daughter [name] updated.

## 2020-10-31 NOTE — ED Triage Notes (Addendum)
Pt here from home  Via [facility] for chest pain, vomiting and afib rvr.  HR 70 - 140.  Vomited several times on the truck.  Given 4 mg zofran IM.  Placed on 2L [place] for O2 sats of 90% RA.

## 2020-10-31 NOTE — ED Notes (Signed)
Attempted report 

## 2020-10-31 NOTE — Progress Notes (Addendum)
NEW ADMISSION NOTE  Arrival Method: bed Mental Orientation: Alert and oriented x4  Telemetry: yes Assessment: Completed Skin: see notes/flowsheets Iv: left forearm Pain: 0 Tubes: 1 Safety Measures: Safety Fall Prevention Plan has been given, discussed and signed Admission: Completed [facility] [place] Orientation: Patient has been orientated to the room, unit and staff.  Family: 1, daughter Heidee Audi  Orders have been reviewed and implemented. Will continue to monitor the patient. Call light has been placed within reach and bed alarm has been activated.   Beatris Ship, RN

## 2020-10-31 NOTE — ED Provider Notes (Addendum)
[place] EMERGENCY DEPARTMENT Provider Note   CSN: [id number] Arrival date & time: 10/31/20  6948     History Chief Complaint  Patient presents with  . Chest Pain    tachycardia    Anne Roach is a 86 y.o. female.  Patient with onset of multiple episodes of nausea vomiting.  No diarrhea.  Associated with some mild abdominal discomfort.  Patient vomited several times in the truck on the way in they did give her Zofran IM.  Patient was admitted in August for sepsis.  Patient denies any vomiting of blood.  She has had both Covid vaccines.  No known sick exposures.  With complaint of chest discomfort some mild abdominal discomfort vomiting nausea and feeling short of breath.        Past Medical History:  Diagnosis Date  . Atrial fibrillation ([place])   . DM (diabetes mellitus) ([place])   . HTN (hypertension)   . Tremor     Patient Active Problem List   Diagnosis Date Noted  . Sepsis secondary to UTI ([place]) 07/11/2020  . Chronic atrial fibrillation ([place]) 07/11/2020  . Hypervolemia   . Campylobacter gastrointestinal tract infection 01/03/2019  . Chronic kidney disease (CKD), stage III (moderate) 01/02/2019  . AKI (acute kidney injury) ([place]) 01/02/2019  . Hypovolemia with active loss of fluid 01/02/2019  . Hypokalemia 01/02/2019  . Hypomagnesemia 01/02/2019  . Hypoalbuminemia 01/02/2019  . Bandemia without diagnosis of specific infection 01/02/2019  . Hypotension 01/02/2019  . Fatigue   . Sepsis ([place])   . Bacteria in urine 01/01/2019  . Atrial fibrillation with RVR ([place]) 03/20/2017  . Myogenic ptosis of bilateral eyelids 06/24/2016  . HTN (hypertension)   . Tremor   . Exudative age-related macular degeneration of right eye with active choroidal neovascularization ([place]) 08/29/2014  . Pseudophakia of both eyes 08/29/2014  . OSA (obstructive sleep apnea) 06/29/2012  . Type 2 diabetes mellitus ([place])   . Incidental lung nodule 03/10/2009  . Other  diseases of lung, not elsewhere classified 03/10/2009    Past Surgical History:  Procedure Laterality Date  . APPENDECTOMY    . CARPAL TUNNEL RELEASE    . CATARACT EXTRACTION Bilateral   . HERNIA REPAIR       OB History   No obstetric history on file.     Family History  Problem Relation Age of Onset  . Diabetes Father   . Asthma Brother     Social History   Tobacco Use  . Smoking status: Never Smoker  . Smokeless tobacco: Never Used  Substance Use Topics  . Alcohol use: No    Alcohol/week: 0.0 standard drinks  . Drug use: No    Home Medications Prior to Admission medications   Medication Sig Start Date End Date Taking? Authorizing Provider  acetaminophen (TYLENOL) 500 MG tablet Take 1,000 mg by mouth every 6 (six) hours as needed for mild pain or headache.   Yes [provider]  allopurinol (ZYLOPRIM) 100 MG tablet Take 1 tablet (100 mg total) by mouth daily. 03/14/20  Yes Edrick Kins, DPM  cholecalciferol (VITAMIN D3) 25 MCG (1000 UNIT) tablet Take 1,000 Units by mouth daily.   Yes [provider]  colchicine 0.6 MG tablet Take 1 tablet (0.6 mg total) by mouth daily. Patient taking differently: Take 0.6 mg by mouth daily as needed (for flare up). 10/17/20  Yes Edrick Kins, DPM  diltiazem (CARDIZEM CD) 240 MG 24 hr capsule TAKE 1 CAPSULE(240 MG)  BY MOUTH DAILY Patient taking differently: Take 240 mg by mouth daily. 08/21/20  Yes Josue Hector, MD  ELIQUIS 5 MG TABS tablet TAKE 1 TABLET(5 MG) BY MOUTH TWICE DAILY Patient taking differently: Take 5 mg by mouth 2 (two) times daily. 09/18/20  Yes Josue Hector, MD  gabapentin (NEURONTIN) 100 MG capsule Take 1 capsule (100 mg total) by mouth 3 (three) times daily. 10/17/20  Yes Edrick Kins, DPM  magnesium oxide (MAG-OX) 400 (241.3 Mg) MG tablet Take 1 tablet (400 mg total) by mouth daily. 07/15/20  Yes Geradine Girt, DO  meclizine (ANTIVERT) 25 MG tablet Take 1 tablet (25 mg total) by mouth 3  (three) times daily as needed for dizziness. 09/03/20  Yes Josue Hector, MD  metFORMIN (GLUCOPHAGE-XR) 750 MG 24 hr tablet Take 750 mg by mouth daily. 05/14/15  Yes [provider]  Multiple Vitamins-Minerals (ICAPS AREDS 2 PO) Take 2 capsules by mouth daily.   Yes [provider]  pantoprazole (PROTONIX) 40 MG tablet Take 40 mg by mouth daily as needed (heartburn). 08/22/20  Yes [provider]  [place] test strip  10/06/20   [provider]    Allergies    Patient has no known allergies.  Review of Systems   Review of Systems  Constitutional: Negative for chills and fever.  HENT: Negative for congestion, rhinorrhea and sore throat.   Eyes: Negative for visual disturbance.  Respiratory: Positive for shortness of breath. Negative for cough.   Cardiovascular: Positive for chest pain and palpitations. Negative for leg swelling.  Gastrointestinal: Positive for nausea and vomiting. Negative for abdominal pain and diarrhea.  Genitourinary: Negative for dysuria.  Musculoskeletal: Negative for back pain and neck pain.  Skin: Negative for rash.  Neurological: Negative for dizziness, light-headedness and headaches.  Hematological: Does not bruise/bleed easily.  Psychiatric/Behavioral: Negative for confusion.    Physical Exam Updated Vital Signs BP 124/76   Pulse (!) 104   Temp 98.4 F (36.9 C) (Oral)   Resp (!) 28   Ht 1.676 m (_0 )   Wt 69.3 kg   SpO2 96%   BMI 24.66 kg/m   Physical Exam Vitals and nursing note reviewed.  Constitutional:      General: She is in acute distress.     Appearance: She is well-developed and well-nourished.  HENT:     Head: Normocephalic and atraumatic.  Eyes:     Extraocular Movements: Extraocular movements intact.     Conjunctiva/sclera: Conjunctivae normal.     Pupils: Pupils are equal, round, and reactive to light.  Cardiovascular:     Rate and Rhythm: Tachycardia present. Rhythm irregular.      Heart sounds: No murmur heard.   Pulmonary:     Effort: Respiratory distress present.     Breath sounds: Normal breath sounds.  Abdominal:     Palpations: Abdomen is soft.     Tenderness: There is no abdominal tenderness.  Musculoskeletal:        General: No edema. Normal range of motion.     Cervical back: Neck supple.  Skin:    General: Skin is warm and dry.  Neurological:     General: No focal deficit present.     Mental Status: She is alert and oriented to person, place, and time.     Cranial Nerves: No cranial nerve deficit.     Sensory: No sensory deficit.     Motor: No weakness.  Psychiatric:  Mood and Affect: Mood and affect normal.     ED Results / Procedures / Treatments   Labs (all labs ordered are listed, but only abnormal results are displayed) Labs Reviewed  LACTIC ACID, PLASMA - Abnormal; Notable for the following components:      Result Value   Lactic Acid, Venous 3.1 (*)    All other components within normal limits  COMPREHENSIVE METABOLIC PANEL - Abnormal; Notable for the following components:   Glucose, Bld 131 (*)    Creatinine, Ser 1.25 (*)    GFR, Estimated 42 (*)    All other components within normal limits  CBC WITH DIFFERENTIAL/PLATELET - Abnormal; Notable for the following components:   WBC 17.5 (*)    Neutro Abs 14.3 (*)    Abs Immature Granulocytes 0.09 (*)    All other components within normal limits  PROTIME-INR - Abnormal; Notable for the following components:   Prothrombin Time 16.5 (*)    INR 1.4 (*)    All other components within normal limits  APTT - Abnormal; Notable for the following components:   aPTT 39 (*)    All other components within normal limits  RESP PANEL BY RT-PCR (FLU A&B, COVID) ARPGX2  CULTURE, BLOOD (ROUTINE X 2)  CULTURE, BLOOD (ROUTINE X 2)  URINE CULTURE  LACTIC ACID, PLASMA  URINALYSIS, ROUTINE W REFLEX MICROSCOPIC  TROPONIN I (HIGH SENSITIVITY)  TROPONIN I (HIGH SENSITIVITY)    EKG EKG  Interpretation  Date/Time:  Wednesday October 31 2020 07:35:29 EST Ventricular Rate:  114 PR Interval:    QRS Duration: 90 QT Interval:  345 QTC Calculation: 476 R Axis:   71 Text Interpretation: Atrial fibrillation Borderline low voltage, extremity leads Minimal ST depression, inferior leads Confirmed by Fredia Sorrow [phone]) on 10/31/2020 7:48:10 AM Also confirmed by Fredia Sorrow [phone]), editor Hattie Perch ([id number])  on 10/31/2020 10:36:12 AM   Radiology DG Chest Portable 1 View  Result Date: 10/31/2020 CLINICAL DATA:  Chest pain EXAM: PORTABLE CHEST 1 VIEW COMPARISON:  July 11, 2020 FINDINGS: There are small pleural effusions bilaterally with ill-defined airspace opacity in the lung bases. There is cardiomegaly with mild pulmonary venous hypertension. No adenopathy. There is aortic atherosclerosis. There is degenerative change in each shoulder. IMPRESSION: There is cardiomegaly with a degree of pulmonary vascular congestion and small pleural effusions. Question a degree of congestive heart failure. Ill-defined airspace opacity in the lung bases may represent edema or patchy pneumonia. Correlation with COVID-19 status may be advisable given this appearance. Aortic Atherosclerosis (ICD10-I70.0). Electronically Signed   By: Lowella Grip III M.D.   On: 10/31/2020 08:18    Procedures Procedures (including critical care time)CRITICAL CARE Performed by: Fredia Sorrow Total critical care time: 60 minutes Critical care time was exclusive of separately billable procedures and treating other patients. Critical care was necessary to treat or prevent imminent or life-threatening deterioration. Critical care was time spent personally by me on the following activities: development of treatment plan with patient and/or surrogate as well as nursing, discussions with consultants, evaluation of patient's response to treatment, examination of patient, obtaining history from patient or  surrogate, ordering and performing treatments and interventions, ordering and review of laboratory studies, ordering and review of radiographic studies, pulse oximetry and re-evaluation of patient's condition.   Medications Ordered in ED Medications  lactated ringers infusion (has no administration in time range)  metroNIDAZOLE (FLAGYL) tablet 500 mg (500 mg Oral Given 10/31/20 1055)  vancomycin (VANCOREADY) IVPB 1500 mg/300 mL (1,500 mg Intravenous  New Bag/Given 10/31/20 1051)  ceFEPIme (MAXIPIME) 2 g in sodium chloride 0.9 % 100 mL IVPB (has no administration in time range)  vancomycin (VANCOCIN) IVPB 1000 mg/200 mL premix (has no administration in time range)  lactated ringers bolus 1,000 mL (1,000 mLs Intravenous New Bag/Given 10/31/20 1052)  ceFEPIme (MAXIPIME) 2 g in sodium chloride 0.9 % 100 mL IVPB (0 g Intravenous Stopped 10/31/20 0945)  lactated ringers bolus 1,000 mL (0 mLs Intravenous Stopped 10/31/20 1016)    ED Course  I have reviewed the triage vital signs and the nursing notes.  Pertinent labs & imaging results that were available during my care of the patient were reviewed by me and considered in my medical decision making (see chart for details).    MDM Rules/Calculators/A&P                          Patient based on vital signs met sepsis criteria.  Was tachypneic tachycardic not febrile.  In addition patient's white blood cell count came back at 17,000.  Lactic acid was less than 4.  Patient ultimately ended up getting 2 L of fluid plus which is her 30 cc/kg fluid challenge.  But she never was hypotensive.  Lactic acids were always less than 4.  Patient now feeling better.  Presumed source is lungs.  Patient never completely hypoxic but did feel short of breath.  Feels better on 2 L of oxygen.  Covid testing was negative.  Patient with admission in August for sepsis as well.  Urinalysis still pending.  Patient's troponins are still pending.  These were ordered because of  the chest discomfort.  Her abdomen is soft and nontender.  Subjectively she has complaint of some mild left-sided abdominal discomfort.   Patient received broad-spectrum antibiotics empirically.  Discussed with hospitalist who will see for admission  Final Clinical Impression(s) / ED Diagnoses Final diagnoses:  Sepsis, due to unspecified organism, unspecified whether acute organ dysfunction present [facility])    Rx / DC Orders ED Discharge Orders    None       Fredia Sorrow, MD 10/31/20 1122    Fredia Sorrow, MD 10/31/20 1131

## 2020-10-31 NOTE — H&P (Signed)
History and Physical    Anne Roach EHM:[id number] DOB: Jan 06, 1933 DOA: 10/31/2020  Referring MD/NP/PA: Algis Downs, MD PCP: Lucianne Lei, MD  Patient coming from: home via EMS  Chief Complaint: Nausea and vomiting  I have personally briefly reviewed patient's old medical records in [place]   HPI: Anne Roach is a 86 y.o. female with medical history significant of chronic atrial fibrillation, hypertension, hyperlipidemia, diabetes mellitus type 2 presents with complaints of nausea and vomiting.  The patient's daughter with whom she lives acts as a interpreter for the patient.  Symptoms started this morning around 3-4 morning.  She was noted to have severe epigastric cramping with multiple episodes of nausea and vomiting.  Emesis was nonbloody in appearance.  Denied any complaints of fever, chills, cough, dysuria, frequency, or diarrhea.  Patient noted associated symptoms of some shortness of breath, palpitations, and malaise.  Her last bowel movement was approximately 2 days ago.  ED Course: On admission to the emergency department patient to be afebrile with pulse 87-121, respirations 18-28, blood pressure maintained, and O2 saturation 90-100% on 2 L of nasal cannula oxygen.  Labs significant for WBC 17.5, 15, creatinine 1.25, and lactic acid 3.1.  Chest x-ray revealed cardiomegaly with a degree of pulmonary vascular congestion and small pleural effusions with ill-defined airspace opacity in the lung bases to give concern for edema or patchy pneumonia.  Urinalysis positive for trace leukocytes, rare bacteria, 6-10 WBCs.  Review of Systems  Constitutional: Positive for malaise/fatigue. Negative for fever.  HENT: Negative for ear discharge and nosebleeds.   Eyes: Negative for photophobia and discharge.  Respiratory: Positive for shortness of breath. Negative for cough and sputum production.   Cardiovascular: Positive for chest pain and leg swelling.  Gastrointestinal:  Positive for abdominal pain, nausea and vomiting.  Genitourinary: Negative for dysuria and hematuria.  Musculoskeletal: Negative for falls.  Skin: Negative for rash.  Neurological: Negative for focal weakness and loss of consciousness.  Psychiatric/Behavioral: Negative for substance abuse.    Past Medical History:  Diagnosis Date  . Atrial fibrillation ([place])   . DM (diabetes mellitus) ([place])   . HTN (hypertension)   . Tremor     Past Surgical History:  Procedure Laterality Date  . APPENDECTOMY    . CARPAL TUNNEL RELEASE    . CATARACT EXTRACTION Bilateral   . HERNIA REPAIR       reports that she has never smoked. She has never used smokeless tobacco. She reports that she does not drink alcohol and does not use drugs.  No Known Allergies  Family History  Problem Relation Age of Onset  . Diabetes Father   . Asthma Brother     Prior to Admission medications   Medication Sig Start Date End Date Taking? Authorizing Provider  acetaminophen (TYLENOL) 500 MG tablet Take 1,000 mg by mouth every 6 (six) hours as needed for mild pain or headache.   Yes [provider]  allopurinol (ZYLOPRIM) 100 MG tablet Take 1 tablet (100 mg total) by mouth daily. 03/14/20  Yes Edrick Kins, DPM  cholecalciferol (VITAMIN D3) 25 MCG (1000 UNIT) tablet Take 1,000 Units by mouth daily.   Yes [provider]  colchicine 0.6 MG tablet Take 1 tablet (0.6 mg total) by mouth daily. Patient taking differently: Take 0.6 mg by mouth daily as needed (for flare up). 10/17/20  Yes Edrick Kins, DPM  diltiazem (CARDIZEM CD) 240 MG 24 hr capsule TAKE 1 CAPSULE(240 MG) BY MOUTH  DAILY Patient taking differently: Take 240 mg by mouth daily. 08/21/20  Yes Josue Hector, MD  ELIQUIS 5 MG TABS tablet TAKE 1 TABLET(5 MG) BY MOUTH TWICE DAILY Patient taking differently: Take 5 mg by mouth 2 (two) times daily. 09/18/20  Yes Josue Hector, MD  gabapentin (NEURONTIN) 100 MG capsule Take 1 capsule (100 mg  total) by mouth 3 (three) times daily. 10/17/20  Yes Edrick Kins, DPM  magnesium oxide (MAG-OX) 400 (241.3 Mg) MG tablet Take 1 tablet (400 mg total) by mouth daily. 07/15/20  Yes Geradine Girt, DO  meclizine (ANTIVERT) 25 MG tablet Take 1 tablet (25 mg total) by mouth 3 (three) times daily as needed for dizziness. 09/03/20  Yes Josue Hector, MD  metFORMIN (GLUCOPHAGE-XR) 750 MG 24 hr tablet Take 750 mg by mouth daily. 05/14/15  Yes [provider]  Multiple Vitamins-Minerals (ICAPS AREDS 2 PO) Take 2 capsules by mouth daily.   Yes [provider]  pantoprazole (PROTONIX) 40 MG tablet Take 40 mg by mouth daily as needed (heartburn). 08/22/20  Yes [provider]  ACCU-CHEK AVIVA PLUS test strip  10/06/20   [provider]    Physical Exam:  Constitutional: Elderly female who appears acutely ill Vitals:   10/31/20 0930 10/31/20 0945 10/31/20 1015 10/31/20 1045  BP: 112/72 130/83 128/78 124/76  Pulse: (!) 121 (!) 114 (!) 117 (!) 104  Resp: (!) 21 18 19  (!) 28  Temp:      TempSrc:      SpO2: 100% 100% 98% 96%  Weight:      Height:       Eyes: PERRL, lids and conjunctivae normal ENMT: Mucous membranes are dry. Posterior pharynx clear of any exudate or lesions.  Neck: normal, supple, no masses, no thyromegaly Respiratory: clear to auscultation bilaterally, no wheezing, no crackles. Normal respiratory effort. No accessory muscle use.  Cardiovascular: Irregular irregular and tachycardic.  Trace bilateral lower extremity edema. 2+ pedal pulses. No carotid bruits.  Abdomen: no tenderness, no masses palpated. No hepatosplenomegaly. Bowel sounds positive.  Musculoskeletal: no clubbing / cyanosis. No joint deformity upper and lower extremities. Good ROM, no contractures. Normal muscle tone.  Skin: no rashes, lesions, ulcers. No induration Neurologic: CN 2-12 grossly intact. Sensation intact, DTR normal. Strength 5/5 in all 4.  Psychiatric: Normal judgment  and insight. Alert and oriented x 3. Normal mood.     Labs on Admission: I have personally reviewed following labs and imaging studies  CBC: Recent Labs  Lab 10/31/20 0807  WBC 17.5*  NEUTROABS 14.3*  HGB 12.6  HCT 39.7  MCV 98.3  PLT 785   Basic Metabolic Panel: Recent Labs  Lab 10/31/20 0807  NA 138  K 4.1  CL 102  CO2 24  GLUCOSE 131*  BUN 15  CREATININE 1.25*  CALCIUM 9.5   GFR: Estimated Creatinine Clearance: 30.8 mL/min (A) (by C-G formula based on SCr of 1.25 mg/dL (H)). Liver Function Tests: Recent Labs  Lab 10/31/20 0807  AST 22  ALT 15  ALKPHOS 43  BILITOT 0.5  PROT 7.0  ALBUMIN 3.5   No results for input(s): LIPASE, AMYLASE in the last 168 hours. No results for input(s): AMMONIA in the last 168 hours. Coagulation Profile: Recent Labs  Lab 10/31/20 0807  INR 1.4*   Cardiac Enzymes: No results for input(s): CKTOTAL, CKMB, CKMBINDEX, TROPONINI in the last 168 hours. BNP (last 3 results) No results for input(s): PROBNP in the last 8760 hours.  HbA1C: No results for input(s): HGBA1C in the last 72 hours. CBG: No results for input(s): GLUCAP in the last 168 hours. Lipid Profile: No results for input(s): CHOL, HDL, LDLCALC, TRIG, CHOLHDL, LDLDIRECT in the last 72 hours. Thyroid Function Tests: No results for input(s): TSH, T4TOTAL, FREET4, T3FREE, THYROIDAB in the last 72 hours. Anemia Panel: No results for input(s): VITAMINB12, FOLATE, FERRITIN, TIBC, IRON, RETICCTPCT in the last 72 hours. Urine analysis:    Component Value Date/Time   COLORURINE YELLOW 07/11/2020 2035   APPEARANCEUR CLOUDY (A) 07/11/2020 2035   LABSPEC 1.015 07/11/2020 2035   PHURINE 5.0 07/11/2020 2035   GLUCOSEU NEGATIVE 07/11/2020 2035   HGBUR SMALL (A) 07/11/2020 2035   BILIRUBINUR NEGATIVE 07/11/2020 2035   KETONESUR NEGATIVE 07/11/2020 2035   PROTEINUR 30 (A) 07/11/2020 2035   UROBILINOGEN 0.2 06/12/2011 1201   NITRITE NEGATIVE 07/11/2020 2035   LEUKOCYTESUR  MODERATE (A) 07/11/2020 2035   Sepsis Labs: Recent Results (from the past 240 hour(s))  Resp Panel by RT-PCR (Flu A&B, Covid) Nasopharyngeal Swab     Status: None   Collection Time: 10/31/20  8:44 AM   Specimen: Nasopharyngeal Swab; Nasopharyngeal(NP) swabs in vial transport medium  Result Value Ref Range Status   SARS Coronavirus 2 by RT PCR NEGATIVE NEGATIVE Final    Comment: (NOTE) SARS-CoV-2 target nucleic acids are NOT DETECTED.  The SARS-CoV-2 RNA is generally detectable in upper respiratory specimens during the acute phase of infection. The lowest concentration of SARS-CoV-2 viral copies this assay can detect is 138 copies/mL. A negative result does not preclude SARS-Cov-2 infection and should not be used as the sole basis for treatment or other patient management decisions. A negative result may occur with  improper specimen collection/handling, submission of specimen other than nasopharyngeal swab, presence of viral mutation(s) within the areas targeted by this assay, and inadequate number of viral copies(<138 copies/mL). A negative result must be combined with clinical observations, patient history, and epidemiological information. The expected result is Negative.  Fact Sheet for Patients:  EntrepreneurPulse.com.au  Fact Sheet for Healthcare Providers:  IncredibleEmployment.be  This test is no t yet approved or cleared by the [place] FDA and  has been authorized for detection and/or diagnosis of SARS-CoV-2 by FDA under an Emergency Use Authorization (EUA). This EUA will remain  in effect (meaning this test can be used) for the duration of the COVID-19 declaration under Section 564(b)(1) of the Act, 21 U.S.C.section 360bbb-3(b)(1), unless the authorization is terminated  or revoked sooner.       Influenza A by PCR NEGATIVE NEGATIVE Final   Influenza B by PCR NEGATIVE NEGATIVE Final    Comment: (NOTE) The Xpert Xpress  SARS-CoV-2/FLU/RSV plus assay is intended as an aid in the diagnosis of influenza from Nasopharyngeal swab specimens and should not be used as a sole basis for treatment. Nasal washings and aspirates are unacceptable for Xpert Xpress SARS-CoV-2/FLU/RSV testing.  Fact Sheet for Patients: EntrepreneurPulse.com.au  Fact Sheet for Healthcare Providers: IncredibleEmployment.be  This test is not yet approved or cleared by the [place] FDA and has been authorized for detection and/or diagnosis of SARS-CoV-2 by FDA under an Emergency Use Authorization (EUA). This EUA will remain in effect (meaning this test can be used) for the duration of the COVID-19 declaration under Section 564(b)(1) of the Act, 21 U.S.C. section 360bbb-3(b)(1), unless the authorization is terminated or revoked.  Performed at [facility], [place] [address] [place], [place] [address]   Blood Culture (routine x  2)     Status: None (Preliminary result)   Collection Time: 10/31/20  9:04 AM   Specimen: BLOOD  Result Value Ref Range Status   Specimen Description BLOOD LEFT ANTECUBITAL  Final   Special Requests   Final    BOTTLES DRAWN AEROBIC AND ANAEROBIC Blood Culture results may not be optimal due to an inadequate volume of blood received in culture bottles   Culture   Final    NO GROWTH <12 HOURS Performed at [facility], [place] [address], [place], [place] [address]    Report Status PENDING  Incomplete  Blood Culture (routine x 2)     Status: None (Preliminary result)   Collection Time: 10/31/20  9:09 AM   Specimen: BLOOD LEFT FOREARM  Result Value Ref Range Status   Specimen Description BLOOD LEFT FOREARM  Final   Special Requests   Final    BOTTLES DRAWN AEROBIC AND ANAEROBIC Blood Culture adequate volume   Culture   Final    NO GROWTH <12 HOURS Performed at [facility], [place] [address] [place], [place] [address]    Report Status PENDING  Incomplete      Radiological Exams on Admission: DG Chest Portable 1 View  Result Date: 10/31/2020 CLINICAL DATA:  Chest pain EXAM: PORTABLE CHEST 1 VIEW COMPARISON:  July 11, 2020 FINDINGS: There are small pleural effusions bilaterally with ill-defined airspace opacity in the lung bases. There is cardiomegaly with mild pulmonary venous hypertension. No adenopathy. There is aortic atherosclerosis. There is degenerative change in each shoulder. IMPRESSION: There is cardiomegaly with a degree of pulmonary vascular congestion and small pleural effusions. Question a degree of congestive heart failure. Ill-defined airspace opacity in the lung bases may represent edema or patchy pneumonia. Correlation with COVID-19 status may be advisable given this appearance. Aortic Atherosclerosis (ICD10-I70.0). Electronically Signed   By: Lowella Grip III M.D.   On: 10/31/2020 08:18    EKG: Independently reviewed.  Atrial fibrillation 114 bpm  Assessment/Plan SIRS/sepsis: Acute.  Patient presented with tachycardia and tachypnea with WBC 17.5 and initial lactic acid of 3.1.  Chest x-ray concerning for ill-defined airspace opacities concerning for edema or pneumonia.  Urinalysis revealed trace leukocytes with rare bacteria for which urinary tract infection seems less likely source of patient's symptoms.  At this time is not totally clear if symptoms are secondary to pneumonia or possibly some GI pathology. -Admit to a medical telemetry bed -Follow-up blood cultures -Continue empiric antibiotics of vancomycin, metronidazole, and cefepime and de-escalate when medically appropriate. -Check CT scan of the abdomen and pelvis without contrast  Suspected pneumonia: Chest x-ray ill-defined airspace opacities concerning either for edema or pneumonia.  In the setting of multiple episodes of nausea and vomiting my consider suspect likely aspiration. -Elevate head of the bed -Aspiration precautions -Check procalcitonin -Antibiotics  as seen above  Abdominal pain: Acute. Patient reported severe epigastric cramping prior to onset of nausea and vomiting.  Records note prior history of colitis, but she denies any reports of diarrhea. -Follow-up CT scan of the abdomen and pelvis  Nausea and vomiting: Acute.  Patient reportedly had nausea and vomiting since 3 or 4 AM this morning.  Question of possibility of gastroenteritis. -Antiemetics as needed  -Advance diet as tolerated  Diastolic congestive heart failure: chronic. On physical exam patient appears to be euvolemic.  Last EF noted to be 60 -65% back in 2020. -Strict intake and output -Daily weights -Add on BNP    Persistent atrial  fibrillation on chronic anticoagulation: Appears to be in chronic atrial fibrillation.  CHA2DS2-VASc score = 6.  Medications include diltiazem 240 mg daily and Eliquis 5 mg twice daily. -Continue diltiazem and Eliquis  Diabetes mellitus type 2, controlled: On admission patient's glucose noted to be 131.  Last hemoglobin A1c 6.3 in 07/12/2020. home medications include Metformin extended release 750 mg daily. -Hypoglycemic protocols -Hold metformin -Continue gabapentin - CBGs with meals with sensitive SSI  History of gout: Patient without acute flare at this time. -Continue allopurinol  GERD -Continue Protonix  DVT prophylaxis: Eliquis Code Status: Full Family Communication: Daughter updated at bedside Disposition Plan: Possible discharge home in 1 to 2 days Consults called: none  Admission status: Inpatient, require more than 2 midnight stay  Norval Morton MD [facility]   If 7PM-7AM, please contact night-coverage   10/31/2020, 11:35 AM

## 2020-11-01 ENCOUNTER — Inpatient Hospital Stay (HOSPITAL_COMMUNITY): Payer: Medicare HMO

## 2020-11-01 ENCOUNTER — Other Ambulatory Visit: Payer: Self-pay

## 2020-11-01 ENCOUNTER — Encounter (HOSPITAL_COMMUNITY): Payer: Self-pay | Admitting: Internal Medicine

## 2020-11-01 DIAGNOSIS — E119 Type 2 diabetes mellitus without complications: Secondary | ICD-10-CM

## 2020-11-01 DIAGNOSIS — I351 Nonrheumatic aortic (valve) insufficiency: Secondary | ICD-10-CM

## 2020-11-01 DIAGNOSIS — R6520 Severe sepsis without septic shock: Secondary | ICD-10-CM

## 2020-11-01 DIAGNOSIS — I361 Nonrheumatic tricuspid (valve) insufficiency: Secondary | ICD-10-CM

## 2020-11-01 DIAGNOSIS — I5022 Chronic systolic (congestive) heart failure: Secondary | ICD-10-CM

## 2020-11-01 DIAGNOSIS — I110 Hypertensive heart disease with heart failure: Secondary | ICD-10-CM

## 2020-11-01 DIAGNOSIS — R109 Unspecified abdominal pain: Secondary | ICD-10-CM

## 2020-11-01 DIAGNOSIS — I4819 Other persistent atrial fibrillation: Secondary | ICD-10-CM

## 2020-11-01 DIAGNOSIS — A419 Sepsis, unspecified organism: Principal | ICD-10-CM

## 2020-11-01 DIAGNOSIS — K529 Noninfective gastroenteritis and colitis, unspecified: Secondary | ICD-10-CM

## 2020-11-01 DIAGNOSIS — R9431 Abnormal electrocardiogram [ECG] [EKG]: Secondary | ICD-10-CM

## 2020-11-01 LAB — ECHOCARDIOGRAM COMPLETE
Area-P 1/2: 2.58 cm2
Height: 65 in
S' Lateral: 4.11 cm
Weight: 2786.61 oz

## 2020-11-01 LAB — CBC
HCT: 31.3 % — ABNORMAL LOW (ref 36.0–46.0)
Hemoglobin: 10 g/dL — ABNORMAL LOW (ref 12.0–15.0)
MCH: 31.1 pg (ref 26.0–34.0)
MCHC: 31.9 g/dL (ref 30.0–36.0)
MCV: 97.2 fL (ref 80.0–100.0)
Platelets: 143 10*3/uL — ABNORMAL LOW (ref 150–400)
RBC: 3.22 MIL/uL — ABNORMAL LOW (ref 3.87–5.11)
RDW: 14.7 % (ref 11.5–15.5)
WBC: 7.2 10*3/uL (ref 4.0–10.5)
nRBC: 0 % (ref 0.0–0.2)

## 2020-11-01 LAB — BASIC METABOLIC PANEL
Anion gap: 9 (ref 5–15)
BUN: 17 mg/dL (ref 8–23)
CO2: 25 mmol/L (ref 22–32)
Calcium: 8.6 mg/dL — ABNORMAL LOW (ref 8.9–10.3)
Chloride: 105 mmol/L (ref 98–111)
Creatinine, Ser: 1.17 mg/dL — ABNORMAL HIGH (ref 0.44–1.00)
GFR, Estimated: 46 mL/min — ABNORMAL LOW (ref >60)
Glucose, Bld: 114 mg/dL — ABNORMAL HIGH (ref 70–99)
Potassium: 4 mmol/L (ref 3.5–5.1)
Sodium: 139 mmol/L (ref 135–145)

## 2020-11-01 LAB — MAGNESIUM: Magnesium: 1.9 mg/dL (ref 1.7–2.4)

## 2020-11-01 LAB — GLUCOSE, CAPILLARY
Glucose-Capillary: 101 mg/dL — ABNORMAL HIGH (ref 70–99)
Glucose-Capillary: 109 mg/dL — ABNORMAL HIGH (ref 70–99)
Glucose-Capillary: 132 mg/dL — ABNORMAL HIGH (ref 70–99)
Glucose-Capillary: 84 mg/dL (ref 70–99)
Glucose-Capillary: 89 mg/dL (ref 70–99)

## 2020-11-01 MED ORDER — POTASSIUM CHLORIDE CRYS ER 20 MEQ PO TBCR
20.0000 meq | EXTENDED_RELEASE_TABLET | Freq: Once | ORAL | Status: AC
  Administered 2020-11-01: 20 meq via ORAL
  Filled 2020-11-01: qty 1

## 2020-11-01 MED ORDER — SENNA 8.6 MG PO TABS
1.0000 | ORAL_TABLET | Freq: Every day | ORAL | Status: DC
  Administered 2020-11-01 – 2020-11-05 (×4): 8.6 mg via ORAL
  Filled 2020-11-01 (×4): qty 1

## 2020-11-01 MED ORDER — FUROSEMIDE 10 MG/ML IJ SOLN
20.0000 mg | Freq: Once | INTRAMUSCULAR | Status: AC
  Administered 2020-11-01: 20 mg via INTRAVENOUS
  Filled 2020-11-01: qty 2

## 2020-11-01 MED ORDER — PANTOPRAZOLE SODIUM 40 MG PO TBEC
40.0000 mg | DELAYED_RELEASE_TABLET | Freq: Every day | ORAL | Status: DC
  Administered 2020-11-01 – 2020-11-05 (×5): 40 mg via ORAL
  Filled 2020-11-01 (×5): qty 1

## 2020-11-01 MED ORDER — CEFDINIR 300 MG PO CAPS
300.0000 mg | ORAL_CAPSULE | Freq: Two times a day (BID) | ORAL | Status: AC
  Administered 2020-11-01 – 2020-11-03 (×6): 300 mg via ORAL
  Filled 2020-11-01 (×7): qty 1

## 2020-11-01 MED ORDER — POLYETHYLENE GLYCOL 3350 17 G PO PACK
17.0000 g | PACK | Freq: Two times a day (BID) | ORAL | Status: DC
  Administered 2020-11-01 – 2020-11-04 (×4): 17 g via ORAL
  Filled 2020-11-01 (×6): qty 1

## 2020-11-01 NOTE — Progress Notes (Signed)
OT Cancellation Note  Patient Details Name: Anne Roach MRN: [id number] DOB: 1933/04/29   Cancelled Treatment:    Reason Eval/Treat Not Completed: Other (comment) Attempted to see patient however eating lunch, will re-attempt as schedule permits.  Delbert Phenix OT OT pager: [place] 11/01/2020, 12:49 PM

## 2020-11-01 NOTE — Plan of Care (Signed)
  Problem: Education: Goal: Knowledge of General Education information will improve Description Including pain rating scale, medication(s)/side effects and non-pharmacologic comfort measures Outcome: Progressing   

## 2020-11-01 NOTE — Progress Notes (Signed)
PROGRESS NOTE    Anne Roach  XAJ:[id number] DOB: Oct 21, 1933 DOA: 10/31/2020 PCP: Lucianne Lei, MD   Brief Narrative: 86 year old with past medical history significant for chronic A. fib, hypertension, hyperlipidemia, diabetes type 2 who presents complaining of nausea and vomiting.  Patient developed severe epigastric pain multiple episode of nausea and vomiting.  Denies dysuria or fever.  She also reported shortness of breath. Evaluation in the ED patient was tachycardic heart rate 121, tachypnea respiration rate 28, oxygen saturation 90% on 2 L, leukocytosis white count 17 lactic acid 3.1.  Chest x-ray show degree of pulmonary vascular congestion and pleural effusion, ill-defined airspace opacity in the lung bases.  UA with trace of leukocyte.  CT abdomen and pelvis: Showed diverticulosis without diverticulitis.  Moderate to large right side pleural effusion, no other intra-abdominal pathology.   Assessment & Plan:   Principal Problem:   SIRS (systemic inflammatory response syndrome) (HCC) Active Problems:   Type 2 diabetes mellitus (HCC)   Chronic atrial fibrillation (HCC)   PNA (pneumonia)   Abdominal pain   GERD (gastroesophageal reflux disease)   Chronic diastolic CHF (congestive heart failure) (HCC)     Pressure Injury 03/21/17 Stage I -  Intact skin with non-blanchable redness of a localized area usually over a bony prominence. (Active)  03/21/17 0757  Location: Buttocks  Location Orientation: Right;Left  Staging: Stage I -  Intact skin with non-blanchable redness of a localized area usually over a bony prominence.  Wound Description (Comments):   Present on Admission: Yes    1-Severe sepsis: Patient presented with fever, leukocytosis, lactic acid of 3.1, tachypnea, tachycardia source of infection likely pneumonia -Continue with IV vancomycin and cefepime -Blood cultures, urine culture -White Blood cell trending down.  2-Pneumonia, Right side pleural  effusion: -Concern for parapneumonic effusion -Pulmonary consulted, patient and family declined thoracentesis at this time. -Continue with antibiotics. -We will try one-time dose of Laxis. -Check ECHO.   3-Persistent A fib Continue with Cardizem and eliquis.   4-Abdominal pain , nausea vomiting.  Gallbladder unremarkable, CT abdomen pelvis negative for intra abdominal pathology.  Support care.  Resolved.  Might have been related to underline illness.   5-Diabetes type II: Last hemoglobin A1c 6.3: Continue to hold Metformin. Continue with a sliding scale insulin  History of gout: Patient without acute flare: Continue with allopurinol.    Stable exophytic hypodense lesion from the inferior aspect of the uncinate process of the pancreas, likely a benign or indolent process such as a side branch IPMN or a small chronic pseudocyst. MRI without and with contrast may provide better characterization.    Estimated body mass index is 28.98 kg/m as calculated from the following:   Height as of this encounter: 5\' 5"  (1.651 m).   Weight as of this encounter: 79 kg.   DVT prophylaxis: Eliquis Code Status: Full code Family Communication: Care discussed with patient and daughter who was at bedside Disposition Plan:  Status is: Inpatient  Remains inpatient appropriate because:IV treatments appropriate due to intensity of illness or inability to take PO   Dispo: The patient is from: Home              Anticipated d/c is to: Home              Anticipated d/c date is: 2 days              Patient currently is not medically stable to d/c.        Consultants:  Pulmonary   Procedures:   ECHO   Antimicrobials:  Vancomycin and cefepime.   Subjective: She report mild cough, no further vomiting. Denies abdominal pain.  No Bowel movement in 4 days.   Objective: Vitals:   10/31/20 2305 11/01/20 0327 11/01/20 0723 11/01/20 1020  BP: 105/69 111/70 107/75 (!) 101/50  Pulse:  88 89 84 72  Resp: 18 16 16 18   Temp: 98.5 F (36.9 C) 97.8 F (36.6 C) 98.6 F (37 C) 98.6 F (37 C)  TempSrc: Oral Oral Oral Oral  SpO2: 95% 93% 93% 94%  Weight: 79 kg     Height:        Intake/Output Summary (Last 24 hours) at 11/01/2020 1118 Last data filed at 11/01/2020 0745 Gross per 24 hour  Intake 1516.99 ml  Output 3250 ml  Net -1733.01 ml   Filed Weights   10/31/20 0800 10/31/20 1836 10/31/20 2305  Weight: 69.3 kg 77.2 kg 79 kg    Examination:  General exam: Appears calm and comfortable  Respiratory system: decreased breath sounds on the right.  Cardiovascular system: S1 & S2 heard, RRR. No JVD, murmurs, rubs, gallops or clicks. No pedal edema. Gastrointestinal system: Abdomen is nondistended, soft and nontender. No organomegaly or masses felt. Normal bowel sounds heard. Central nervous system: Alert and oriented. Extremities: Symmetric 5 x 5 power. Trace edema.     Data Reviewed: I have personally reviewed following labs and imaging studies  CBC: Recent Labs  Lab 10/31/20 0807 11/01/20 0344  WBC 17.5* 7.2  NEUTROABS 14.3*  --   HGB 12.6 10.0*  HCT 39.7 31.3*  MCV 98.3 97.2  PLT 186 676*   Basic Metabolic Panel: Recent Labs  Lab 10/31/20 0807 11/01/20 0344  NA 138 139  K 4.1 4.0  CL 102 105  CO2 24 25  GLUCOSE 131* 114*  BUN 15 17  CREATININE 1.25* 1.17*  CALCIUM 9.5 8.6*  MG  --  1.9   GFR: Estimated Creatinine Clearance: 36.5 mL/min (A) (by C-G formula based on SCr of 1.17 mg/dL (H)). Liver Function Tests: Recent Labs  Lab 10/31/20 0807  AST 22  ALT 15  ALKPHOS 43  BILITOT 0.5  PROT 7.0  ALBUMIN 3.5   No results for input(s): LIPASE, AMYLASE in the last 168 hours. No results for input(s): AMMONIA in the last 168 hours. Coagulation Profile: Recent Labs  Lab 10/31/20 0807  INR 1.4*   Cardiac Enzymes: No results for input(s): CKTOTAL, CKMB, CKMBINDEX, TROPONINI in the last 168 hours. BNP (last 3 results) No results for  input(s): PROBNP in the last 8760 hours. HbA1C: Recent Labs    10/31/20 1857  HGBA1C 5.9*   CBG: Recent Labs  Lab 10/31/20 1344 10/31/20 1726 11/01/20 0027 11/01/20 0635  GLUCAP 112* 93 132* 109*   Lipid Profile: No results for input(s): CHOL, HDL, LDLCALC, TRIG, CHOLHDL, LDLDIRECT in the last 72 hours. Thyroid Function Tests: No results for input(s): TSH, T4TOTAL, FREET4, T3FREE, THYROIDAB in the last 72 hours. Anemia Panel: No results for input(s): VITAMINB12, FOLATE, FERRITIN, TIBC, IRON, RETICCTPCT in the last 72 hours. Sepsis Labs: Recent Labs  Lab 10/31/20 0904 10/31/20 1058 10/31/20 1857  PROCALCITON  --   --  <0.10  LATICACIDVEN 3.1* 2.8* 2.2*    Recent Results (from the past 240 hour(s))  Resp Panel by RT-PCR (Flu A&B, Covid) Nasopharyngeal Swab     Status: None   Collection Time: 10/31/20  8:44 AM   Specimen: Nasopharyngeal Swab; Nasopharyngeal(NP) swabs in  vial transport medium  Result Value Ref Range Status   SARS Coronavirus 2 by RT PCR NEGATIVE NEGATIVE Final    Comment: (NOTE) SARS-CoV-2 target nucleic acids are NOT DETECTED.  The SARS-CoV-2 RNA is generally detectable in upper respiratory specimens during the acute phase of infection. The lowest concentration of SARS-CoV-2 viral copies this assay can detect is 138 copies/mL. A negative result does not preclude SARS-Cov-2 infection and should not be used as the sole basis for treatment or other patient management decisions. A negative result may occur with  improper specimen collection/handling, submission of specimen other than nasopharyngeal swab, presence of viral mutation(s) within the areas targeted by this assay, and inadequate number of viral copies(<138 copies/mL). A negative result must be combined with clinical observations, patient history, and epidemiological information. The expected result is Negative.  Fact Sheet for Patients:  EntrepreneurPulse.com.au  Fact Sheet  for Healthcare Providers:  IncredibleEmployment.be  This test is no t yet approved or cleared by the [place] FDA and  has been authorized for detection and/or diagnosis of SARS-CoV-2 by FDA under an Emergency Use Authorization (EUA). This EUA will remain  in effect (meaning this test can be used) for the duration of the COVID-19 declaration under Section 564(b)(1) of the Act, 21 U.S.C.section 360bbb-3(b)(1), unless the authorization is terminated  or revoked sooner.       Influenza A by PCR NEGATIVE NEGATIVE Final   Influenza B by PCR NEGATIVE NEGATIVE Final    Comment: (NOTE) The Xpert Xpress SARS-CoV-2/FLU/RSV plus assay is intended as an aid in the diagnosis of influenza from Nasopharyngeal swab specimens and should not be used as a sole basis for treatment. Nasal washings and aspirates are unacceptable for Xpert Xpress SARS-CoV-2/FLU/RSV testing.  Fact Sheet for Patients: EntrepreneurPulse.com.au  Fact Sheet for Healthcare Providers: IncredibleEmployment.be  This test is not yet approved or cleared by the [place] FDA and has been authorized for detection and/or diagnosis of SARS-CoV-2 by FDA under an Emergency Use Authorization (EUA). This EUA will remain in effect (meaning this test can be used) for the duration of the COVID-19 declaration under Section 564(b)(1) of the Act, 21 U.S.C. section 360bbb-3(b)(1), unless the authorization is terminated or revoked.  Performed at [facility], [place] [address], [place], [place] [address]   Blood Culture (routine x 2)     Status: None (Preliminary result)   Collection Time: 10/31/20  9:04 AM   Specimen: BLOOD  Result Value Ref Range Status   Specimen Description BLOOD LEFT ANTECUBITAL  Final   Special Requests   Final    BOTTLES DRAWN AEROBIC AND ANAEROBIC Blood Culture results may not be optimal due to an inadequate volume of blood received in culture  bottles   Culture   Final    NO GROWTH <12 HOURS Performed at [facility], [place] [address], [place],  [address]    Report Status PENDING  Incomplete  Blood Culture (routine x 2)     Status: None (Preliminary result)   Collection Time: 10/31/20  9:09 AM   Specimen: BLOOD LEFT FOREARM  Result Value Ref Range Status   Specimen Description BLOOD LEFT FOREARM  Final   Special Requests   Final    BOTTLES DRAWN AEROBIC AND ANAEROBIC Blood Culture adequate volume   Culture   Final    NO GROWTH <12 HOURS Performed at [facility], [place] [address], [place],  [address]    Report Status PENDING  Incomplete  Radiology Studies: CT ABDOMEN PELVIS WO CONTRAST  Result Date: 10/31/2020 CLINICAL DATA:  86 year old female with epigastric pain. EXAM: CT ABDOMEN AND PELVIS WITHOUT CONTRAST TECHNIQUE: Multidetector CT imaging of the abdomen and pelvis was performed following the standard protocol without IV contrast. COMPARISON:  CT abdomen pelvis dated 03/20/2017. FINDINGS: Evaluation of this exam is limited in the absence of intravenous contrast. Lower chest: Partially visualized moderate to large right and small left pleural effusions. There is associated partial compressive atelectasis of the lower lobes. Pneumonia is not excluded. There is mild cardiomegaly. No intra-abdominal free air. Small free fluid in the pelvis. Hepatobiliary: Slight irregularity of the liver contour may represent early changes of cirrhosis. Clinical correlation is recommended. No intrahepatic biliary dilatation. The gallbladder is unremarkable. Pancreas: There is a 2.6 x 2.1 x 2.6 cm exophytic hypodense lesion from the inferior aspect of the uncinate process of the pancreas with small calcific foci. This is not significantly changed since the study of 2018, likely a benign or indolent process such as a side branch IPMN or a small chronic pseudocyst. MRI without and with contrast may provide better  characterization. No acute inflammatory changes. No dilatation of the main pancreatic duct. Spleen: Normal in size without focal abnormality. Adrenals/Urinary Tract: The adrenal glands unremarkable. Mild bilateral renal parenchyma atrophy. A 15 mm exophytic hypodense lesion from the lateral interpolar right kidney is not characterized but may represent a cyst. There is no hydronephrosis or nephrolithiasis on either side. The visualized ureters and urinary bladder appear unremarkable. Stomach/Bowel: Small scattered colonic diverticula without active inflammatory changes. There is no bowel obstruction or active inflammation. Appendectomy. Vascular/Lymphatic: Moderate aortoiliac atherosclerotic disease. The IVC is unremarkable. No portal venous gas. There is no adenopathy. There is mild haziness of the mesentery with multiple top-normal lymph nodes with a "misty mesentery" appearance. This finding is nonspecific but may be related to underlying inflammatory/infectious etiology. Reproductive: The uterus and ovaries are grossly unremarkable. Other: None Musculoskeletal: Osteopenia with multilevel degenerative changes of the spine and multilevel disc desiccation and vacuum phenomena. No acute osseous pathology. IMPRESSION: 1. Moderate to large right and small left pleural effusions with associated partial compressive atelectasis of the lower lobes. Pneumonia is not excluded. Clinical correlation is recommended. 2. No acute intra-abdominal or pelvic pathology. No bowel obstruction. 3. Colonic diverticulosis. 4. Stable exophytic hypodense lesion from the inferior aspect of the uncinate process of the pancreas, likely a benign or indolent process such as a side branch IPMN or a small chronic pseudocyst. MRI without and with contrast may provide better characterization. 5. Aortic Atherosclerosis (ICD10-I70.0). Electronically Signed   By: Anner Crete M.D.   On: 10/31/2020 18:17   DG Chest Portable 1 View  Result Date:  10/31/2020 CLINICAL DATA:  Chest pain EXAM: PORTABLE CHEST 1 VIEW COMPARISON:  July 11, 2020 FINDINGS: There are small pleural effusions bilaterally with ill-defined airspace opacity in the lung bases. There is cardiomegaly with mild pulmonary venous hypertension. No adenopathy. There is aortic atherosclerosis. There is degenerative change in each shoulder. IMPRESSION: There is cardiomegaly with a degree of pulmonary vascular congestion and small pleural effusions. Question a degree of congestive heart failure. Ill-defined airspace opacity in the lung bases may represent edema or patchy pneumonia. Correlation with COVID-19 status may be advisable given this appearance. Aortic Atherosclerosis (ICD10-I70.0). Electronically Signed   By: Lowella Grip III M.D.   On: 10/31/2020 08:18        Scheduled Meds: . allopurinol  100 mg Oral Daily  .  apixaban  5 mg Oral BID  . cefdinir  300 mg Oral Q12H  . diltiazem  240 mg Oral Daily  . gabapentin  100 mg Oral TID  . insulin aspart  0-9 Units Subcutaneous TID WC  . magnesium oxide  400 mg Oral Daily  . saccharomyces boulardii  250 mg Oral BID   Continuous Infusions:   LOS: 1 day    Time spent: 35 minutes/     Anne Tourigny A Sayf Kerner, MD [facility]   If 7PM-7AM, please contact night-coverage [email or web address]  11/01/2020, 11:18 AM

## 2020-11-01 NOTE — Progress Notes (Signed)
  Echocardiogram 2D Echocardiogram has been performed.  Anne Roach 11/01/2020, 12:17 PM

## 2020-11-01 NOTE — Evaluation (Signed)
Physical Therapy Evaluation Patient Details Name: Anne Roach MRN: [id number] DOB: [birth year] Today's Date: 11/01/2020   History of Present Illness  86yo female admitted with acute SIRS/sepsis and possible pneumonia. PMH A-fib, DM, HTN, carpal tunnel release  Clinical Impression  Patient received in bed with daughter present who assisted with interpreting today, although patient is able to speak enough English to participate in session. Able to mobilize on a supervision to min guard basis with no device, but gait distance limited by chronic foot/knee pain. SPO2 90-92% on room air, unable to get accurate ambulatory sat due to nail polish but no signs/symptoms of possible desat reported/observed. Left up in recliner with all needs met, daughter present. Will benefit from skilled HHPT f/u when medically ready for DC.     Follow Up Recommendations Home health PT;Supervision for mobility/OOB    Equipment Recommendations  None recommended by PT (well equipped)    Recommendations for Other Services       Precautions / Restrictions Precautions Precautions: Fall;Other (comment) Precaution Comments: watch O2 sats, hx of painful knees and feet Restrictions Weight Bearing Restrictions: No      Mobility  Bed Mobility Overal bed mobility: Needs Assistance Bed Mobility: Supine to Sit     Supine to sit: HOB elevated;Supervision     General bed mobility comments: S for safety, no physical assist given    Transfers Overall transfer level: Needs assistance Equipment used: None Transfers: Sit to/from Stand Sit to Stand: Supervision         General transfer comment: S for safety, no physical assist given; mildly unsteady and grossly weak  Ambulation/Gait Ambulation/Gait assistance: Min guard Gait Distance (Feet): 100 Feet Assistive device: None Gait Pattern/deviations: Step-through pattern;Trunk flexed;Decreased step length - right;Decreased step length - left;Decreased stride  length Gait velocity: decreased   General Gait Details: slow and steady with no device, gait distance limited by knee and foot pain; attempted to get ambulatory O2 sat but unable to get accurate signal due to nail polish- no reported sx of desat  Stairs            Wheelchair Mobility    Modified Rankin (Stroke Patients Only)       Balance Overall balance assessment: Mild deficits observed, not formally tested                                           Pertinent Vitals/Pain Pain Assessment: No/denies pain    Home Living Family/patient expects to be discharged to:: Private residence Living Arrangements: Children Available Help at Discharge: Family;Available PRN/intermittently Type of Home: House Home Access: Level entry     Home Layout: Able to live on main level with bedroom/bathroom;Two level Home Equipment: Walker - 2 wheels;Wheelchair - [employer];Shower seat - built in;Grab bars - tub/shower Additional Comments: pt lives with her daughter who works at [employer] as a professor as well as 3rd shift at another job    Prior Function Level of Independence: Independent with assistive device(s)         Comments: pt would sometimes use a RW and other days not. Only used it if she felt sick.      Hand Dominance        Extremity/Trunk Assessment   Upper Extremity Assessment Upper Extremity Assessment: Generalized weakness    Lower Extremity Assessment Lower Extremity Assessment: Generalized weakness    Cervical /  Trunk Assessment Cervical / Trunk Assessment: Kyphotic  Communication   Communication: Other (comment) (speaks some English, prefers Arabic)  Cognition Arousal/Alertness: Awake/alert Behavior During Therapy: WFL for tasks assessed/performed;Flat affect Overall Cognitive Status: Within Functional Limits for tasks assessed                                 General Comments: very polite and cooperative       General Comments General comments (skin integrity, edema, etc.): SpO2 90-92% on RA, unable to get accurate ambulatory sat due to nail polish but no physical symptoms of possible O2 desat    Exercises     Assessment/Plan    PT Assessment Patient needs continued PT services  PT Problem List Decreased strength;Decreased activity tolerance;Decreased balance;Decreased mobility;Pain;Decreased knowledge of use of DME       PT Treatment Interventions DME instruction;Balance training;Gait training;Stair training;Functional mobility training;Patient/family education;Therapeutic activities;Therapeutic exercise    PT Goals (Current goals can be found in the Care Plan section)  Acute Rehab PT Goals Patient Stated Goal: feel better, go home PT Goal Formulation: With patient/family Time For Goal Achievement: 11/15/20 Potential to Achieve Goals: Fair    Frequency Min 3X/week   Barriers to discharge        Co-evaluation               AM-PAC PT "6 Clicks" Mobility  Outcome Measure Help needed turning from your back to your side while in a flat bed without using bedrails?: None Help needed moving from lying on your back to sitting on the side of a flat bed without using bedrails?: A Little Help needed moving to and from a bed to a chair (including a wheelchair)?: A Little Help needed standing up from a chair using your arms (e.g., wheelchair or bedside chair)?: A Little Help needed to walk in hospital room?: A Little Help needed climbing 3-5 steps with a railing? : A Little 6 Click Score: 19    End of Session Equipment Utilized During Treatment: Gait belt Activity Tolerance: Patient tolerated treatment well;Patient limited by pain (chronic knee and foot pain) Patient left: in chair;with call bell/phone within reach;with family/visitor present Nurse Communication: Mobility status;Other (comment) (SpO2 during session) PT Visit Diagnosis: Unsteadiness on feet (R26.81);Difficulty in  walking, not elsewhere classified (R26.2);Muscle weakness (generalized) (M62.81)    Time: 9432-7614 PT Time Calculation (min) (ACUTE ONLY): 30 min   Charges:   PT Evaluation $PT Eval Moderate Complexity: 1 Mod PT Treatments $Gait Training: 8-22 mins        Windell Norfolk, DPT, PN1   Supplemental Physical Therapist [place]    Pager [phone] Acute Rehab Office [phone]

## 2020-11-01 NOTE — Consult Note (Signed)
NAME:  Anne Roach, MRN:  [id number], DOB:  02/07/1933, LOS: 1 ADMISSION DATE:  10/31/2020, CONSULTATION DATE:  11/01/20 REFERRING MD:  Frederic Jericho, CHIEF COMPLAINT:  N/V   Brief History   86 year old woman w/ hx Afib on AC, HTN p/w N/V found to have possible pneumonia and parapneumonic effusion.  History of present illness   86 year old woman with hx of Afib on AC, HTN p/w epigastric discomfort, N/V.  Placed on O2 for unclear reason in ER.  CT Abd noted bilateral R>L pleural effusions for which pulmonary is consulted.  Currently, patient denies all symptoms. N/V/abd pain have resolved.  No SOB, no orthopnea, mild dry cough, chronic.  No fevers, chills, chest pain, pleurisy.  Past Medical History  Afib on AC HTN HLD DM  Significant Hospital Events   12/15 admitted  Consults:  Pulm  Procedures:  N/A  Significant Diagnostic Tests:  CT A/P IMPRESSION: 1. Moderate to large right and small left pleural effusions with associated partial compressive atelectasis of the lower lobes. Pneumonia is not excluded. Clinical correlation is recommended. 2. No acute intra-abdominal or pelvic pathology. No bowel obstruction. 3. Colonic diverticulosis. 4. Stable exophytic hypodense lesion from the inferior aspect of the uncinate process of the pancreas, likely a benign or indolent process such as a side branch IPMN or a small chronic pseudocyst. MRI without and with contrast may provide better characterization. 5. Aortic Atherosclerosis (ICD10-I70.0).  Micro Data:  COVID neg Pct neg Blood cx pending  Antimicrobials:  Vanc/cefepime/flagyl   Interim history/subjective:  Asymptomatic  Objective   Blood pressure (!) 101/50, pulse 72, temperature 98.6 F (37 C), temperature source Oral, resp. rate 18, height 5\' 5"  (1.651 m), weight 79 kg, SpO2 94 %.        Intake/Output Summary (Last 24 hours) at 11/01/2020 1044 Last data filed at 11/01/2020 0745 Gross per 24 hour  Intake  1516.99 ml  Output 3250 ml  Net -1733.01 ml   Filed Weights   10/31/20 0800 10/31/20 1836 10/31/20 2305  Weight: 69.3 kg 77.2 kg 79 kg    Examination: Constitutional: elderly woman in no acute distress Eyes: eyes are anicteric, reactive to light Ears, nose, mouth, and throat: mucous membranes moist, trachea midline Cardiovascular: heart sounds are regular, ext are warm to touch. no edema Respiratory: diminished bases, no wheezing/crackles/rhonci, no accessory muscle use Gastrointestinal: abdomen is soft with + BS Skin: No rashes, normal turgor Neurologic: moves all 4 ext to command Psychiatric: pleasant, conversant, AOx3  Resolved Hospital Problem list   n/a  Assessment & Plan:  Gastroenteritis NOS improved +SIRS vs. hypovolemia resolved Question chemical aspiration although hx not very consistent Chronic R effusion (noted on [place] 8/27) Constipation Question cystitis CKD   Unclear precipitant to her epigastric pain, I note a history of esophageal dysmotility so maybe something got stuck.  I do not really think she is infected, would narrow abx to 5 days of cefdinir, this would cover possible aspiration event vs. Mild cystitis and most GI bugs.  Offered to drain R pleural fluid for diagnostic purposes but after discussion of risks/benefits, patient and daughter elected for watchful waiting and to call pulmonary clinic (Added to DC instructions).  I think she can probably go home but defer to primary.   Labs   CBC: Recent Labs  Lab 10/31/20 0807 11/01/20 0344  WBC 17.5* 7.2  NEUTROABS 14.3*  --   HGB 12.6 10.0*  HCT 39.7 31.3*  MCV 98.3 97.2  PLT  186 143*    Basic Metabolic Panel: Recent Labs  Lab 10/31/20 0807 11/01/20 0344  NA 138 139  K 4.1 4.0  CL 102 105  CO2 24 25  GLUCOSE 131* 114*  BUN 15 17  CREATININE 1.25* 1.17*  CALCIUM 9.5 8.6*  MG  --  1.9   GFR: Estimated Creatinine Clearance: 36.5 mL/min (A) (by C-G formula based on SCr of 1.17 mg/dL  (H)). Recent Labs  Lab 10/31/20 0807 10/31/20 0904 10/31/20 1058 10/31/20 1857 11/01/20 0344  PROCALCITON  --   --   --  <0.10  --   WBC 17.5*  --   --   --  7.2  LATICACIDVEN  --  3.1* 2.8* 2.2*  --     Liver Function Tests: Recent Labs  Lab 10/31/20 0807  AST 22  ALT 15  ALKPHOS 43  BILITOT 0.5  PROT 7.0  ALBUMIN 3.5   No results for input(s): LIPASE, AMYLASE in the last 168 hours. No results for input(s): AMMONIA in the last 168 hours.  ABG No results found for: PHART, PCO2ART, PO2ART, HCO3, TCO2, ACIDBASEDEF, O2SAT   Coagulation Profile: Recent Labs  Lab 10/31/20 0807  INR 1.4*    Cardiac Enzymes: No results for input(s): CKTOTAL, CKMB, CKMBINDEX, TROPONINI in the last 168 hours.  HbA1C: Hgb A1c MFr Bld  Date/Time Value Ref Range Status  10/31/2020 06:57 PM 5.9 (H) 4.8 - 5.6 % Final    Comment:    (NOTE) Pre diabetes:          5.7%-6.4%  Diabetes:              >6.4%  Glycemic control for   <7.0% adults with diabetes   07/12/2020 04:45 AM 6.3 (H) 4.8 - 5.6 % Final    Comment:    (NOTE) Pre diabetes:          5.7%-6.4%  Diabetes:              >6.4%  Glycemic control for   <7.0% adults with diabetes     CBG: Recent Labs  Lab 10/31/20 1344 10/31/20 1726 11/01/20 0027 11/01/20 0635  GLUCAP 112* 93 132* 109*    Review of Systems:    Positive Symptoms in bold:  Constitutional fevers, chills, weight loss, fatigue, anorexia, malaise  Eyes decreased vision, double vision, eye irritation  Ears, Nose, Mouth, Throat sore throat, trouble swallowing, sinus congestion  Cardiovascular chest pain, paroxysmal nocturnal dyspnea, lower ext edema, palpitations   Respiratory SOB, cough, DOE, hemoptysis, wheezing  Gastrointestinal nausea, vomiting, diarrhea  Genitourinary burning with urination, trouble urinating  Musculoskeletal joint aches, joint swelling, back pain  Integumentary  rashes, skin lesions  Neurological focal weakness, focal  numbness, trouble speaking, headaches  Psychiatric depression, anxiety, confusion  Endocrine polyuria, polydipsia, cold intolerance, heat intolerance  Hematologic abnormal bruising, abnormal bleeding, unexplained nose bleeds  Allergic/Immunologic recurrent infections, hives, swollen lymph nodes     Past Medical History  She,  has a past medical history of Atrial fibrillation (HCC), Chronic diastolic CHF (congestive heart failure) ([place]) (10/31/2020), DM (diabetes mellitus) ([place]), HTN (hypertension), and Tremor.   Surgical History    Past Surgical History:  Procedure Laterality Date  . APPENDECTOMY    . CARPAL TUNNEL RELEASE    . CATARACT EXTRACTION Bilateral   . HERNIA REPAIR       Social History   reports that she has never smoked. She has never used smokeless tobacco. She reports that she does not drink  alcohol and does not use drugs.   Family History   Her family history includes Asthma in her brother; Diabetes in her father.   Allergies No Known Allergies   Home Medications  Prior to Admission medications   Medication Sig Start Date End Date Taking? Authorizing Provider  acetaminophen (TYLENOL) 500 MG tablet Take 1,000 mg by mouth every 6 (six) hours as needed for mild pain or headache.   Yes [provider]  allopurinol (ZYLOPRIM) 100 MG tablet Take 1 tablet (100 mg total) by mouth daily. 03/14/20  Yes Edrick Kins, DPM  cholecalciferol (VITAMIN D3) 25 MCG (1000 UNIT) tablet Take 1,000 Units by mouth daily.   Yes [provider]  colchicine 0.6 MG tablet Take 1 tablet (0.6 mg total) by mouth daily. Patient taking differently: Take 0.6 mg by mouth daily as needed (for flare up). 10/17/20  Yes Edrick Kins, DPM  diltiazem (CARDIZEM CD) 240 MG 24 hr capsule TAKE 1 CAPSULE(240 MG) BY MOUTH DAILY Patient taking differently: Take 240 mg by mouth daily. 08/21/20  Yes Josue Hector, MD  ELIQUIS 5 MG TABS tablet TAKE 1 TABLET(5 MG) BY MOUTH TWICE DAILY Patient  taking differently: Take 5 mg by mouth 2 (two) times daily. 09/18/20  Yes Josue Hector, MD  gabapentin (NEURONTIN) 100 MG capsule Take 1 capsule (100 mg total) by mouth 3 (three) times daily. 10/17/20  Yes Edrick Kins, DPM  magnesium oxide (MAG-OX) 400 (241.3 Mg) MG tablet Take 1 tablet (400 mg total) by mouth daily. 07/15/20  Yes Geradine Girt, DO  meclizine (ANTIVERT) 25 MG tablet Take 1 tablet (25 mg total) by mouth 3 (three) times daily as needed for dizziness. 09/03/20  Yes Josue Hector, MD  metFORMIN (GLUCOPHAGE-XR) 750 MG 24 hr tablet Take 750 mg by mouth daily. 05/14/15  Yes [provider]  Multiple Vitamins-Minerals (ICAPS AREDS 2 PO) Take 2 capsules by mouth daily.   Yes [provider]  pantoprazole (PROTONIX) 40 MG tablet Take 40 mg by mouth daily as needed (heartburn). 08/22/20  Yes [provider]  [place] test strip  10/06/20   [provider]

## 2020-11-02 ENCOUNTER — Telehealth: Payer: Self-pay

## 2020-11-02 ENCOUNTER — Inpatient Hospital Stay (HOSPITAL_COMMUNITY): Payer: Medicare HMO

## 2020-11-02 DIAGNOSIS — I160 Hypertensive urgency: Secondary | ICD-10-CM

## 2020-11-02 DIAGNOSIS — K5900 Constipation, unspecified: Secondary | ICD-10-CM

## 2020-11-02 DIAGNOSIS — N189 Chronic kidney disease, unspecified: Secondary | ICD-10-CM

## 2020-11-02 DIAGNOSIS — J918 Pleural effusion in other conditions classified elsewhere: Secondary | ICD-10-CM

## 2020-11-02 DIAGNOSIS — R6510 Systemic inflammatory response syndrome (SIRS) of non-infectious origin without acute organ dysfunction: Secondary | ICD-10-CM

## 2020-11-02 LAB — GLUCOSE, CAPILLARY
Glucose-Capillary: 101 mg/dL — ABNORMAL HIGH (ref 70–99)
Glucose-Capillary: 121 mg/dL — ABNORMAL HIGH (ref 70–99)
Glucose-Capillary: 105 mg/dL — ABNORMAL HIGH (ref 70–99)
Glucose-Capillary: 104 mg/dL — ABNORMAL HIGH (ref 70–99)

## 2020-11-02 LAB — COMPREHENSIVE METABOLIC PANEL
ALT: 12 U/L (ref 0–44)
AST: 18 U/L (ref 15–41)
Albumin: 2.7 g/dL — ABNORMAL LOW (ref 3.5–5.0)
Alkaline Phosphatase: 31 U/L — ABNORMAL LOW (ref 38–126)
Anion gap: 10 (ref 5–15)
BUN: 16 mg/dL (ref 8–23)
CO2: 24 mmol/L (ref 22–32)
Calcium: 8.7 mg/dL — ABNORMAL LOW (ref 8.9–10.3)
Chloride: 106 mmol/L (ref 98–111)
Creatinine, Ser: 1.16 mg/dL — ABNORMAL HIGH (ref 0.44–1.00)
GFR, Estimated: 46 mL/min — ABNORMAL LOW (ref >60)
Glucose, Bld: 102 mg/dL — ABNORMAL HIGH (ref 70–99)
Potassium: 4 mmol/L (ref 3.5–5.1)
Sodium: 140 mmol/L (ref 135–145)
Total Bilirubin: 0.8 mg/dL (ref 0.3–1.2)
Total Protein: 5.5 g/dL — ABNORMAL LOW (ref 6.5–8.1)

## 2020-11-02 LAB — CBC
HCT: 30.8 % — ABNORMAL LOW (ref 36.0–46.0)
Hemoglobin: 10 g/dL — ABNORMAL LOW (ref 12.0–15.0)
MCH: 31.2 pg (ref 26.0–34.0)
MCHC: 32.5 g/dL (ref 30.0–36.0)
MCV: 96 fL (ref 80.0–100.0)
Platelets: 144 10*3/uL — ABNORMAL LOW (ref 150–400)
RBC: 3.21 MIL/uL — ABNORMAL LOW (ref 3.87–5.11)
RDW: 14.5 % (ref 11.5–15.5)
WBC: 5.6 10*3/uL (ref 4.0–10.5)
nRBC: 0 % (ref 0.0–0.2)

## 2020-11-02 LAB — TROPONIN I (HIGH SENSITIVITY)
Troponin I (High Sensitivity): 7 ng/L (ref <18)
Troponin I (High Sensitivity): 8 ng/L (ref <18)

## 2020-11-02 MED ORDER — NITROGLYCERIN 0.4 MG SL SUBL
0.4000 mg | SUBLINGUAL_TABLET | SUBLINGUAL | Status: DC | PRN
  Administered 2020-11-02: 0.4 mg via SUBLINGUAL

## 2020-11-02 MED ORDER — FUROSEMIDE 10 MG/ML IJ SOLN
20.0000 mg | Freq: Every day | INTRAMUSCULAR | Status: DC
  Administered 2020-11-03: 20 mg via INTRAVENOUS
  Filled 2020-11-02: qty 2

## 2020-11-02 MED ORDER — POTASSIUM CHLORIDE CRYS ER 20 MEQ PO TBCR
20.0000 meq | EXTENDED_RELEASE_TABLET | Freq: Once | ORAL | Status: AC
  Administered 2020-11-02: 20 meq via ORAL
  Filled 2020-11-02: qty 1

## 2020-11-02 MED ORDER — FUROSEMIDE 10 MG/ML IJ SOLN
20.0000 mg | Freq: Once | INTRAMUSCULAR | Status: AC
  Administered 2020-11-02: 20 mg via INTRAVENOUS
  Filled 2020-11-02: qty 2

## 2020-11-02 NOTE — Evaluation (Signed)
Occupational Therapy Evaluation Patient Details Name: Anne Roach MRN: [id number] DOB: 04-Nov-1933 Today's Date: 11/02/2020    History of Present Illness 86yo female admitted with acute SIRS/sepsis and possible pneumonia. PMH A-fib, DM, HTN, carpal tunnel release   Clinical Impression   PTA, pt was living with her daughter and was independent with BADLs. Currently, pt requires Supervision-Min A for ADLs and functional mobility. Pt performing functional mobility with Min Guard A-Min A in hallway; pt with increased pain at BLEs and benefits from RW for reduce pain. SpO2 94-92% on RA. HR elevating once to 140s; returned to 90s. Pt would benefit from further acute OT to facilitate safe dc. Recommend dc to home once medically stable per physician.     Follow Up Recommendations  No OT follow up;Supervision - Intermittent    Equipment Recommendations  None recommended by OT    Recommendations for Other Services PT consult     Precautions / Restrictions Precautions Precautions: Fall;Other (comment) Precaution Comments: watch O2 sats, hx of painful knees and feet Restrictions Weight Bearing Restrictions: No      Mobility Bed Mobility Overal bed mobility: Needs Assistance Bed Mobility: Supine to Sit;Sit to Supine     Supine to sit: HOB elevated;Supervision Sit to supine: Supervision   General bed mobility comments: Supervision for safety. No physical A needed    Transfers Overall transfer level: Needs assistance Equipment used: None Transfers: Sit to/from Stand Sit to Stand: Supervision         General transfer comment: S for safety, no physical assist given; mildly unsteady and grossly weak    Balance Overall balance assessment: Mild deficits observed, not formally tested                                         ADL either performed or assessed with clinical judgement   ADL Overall ADL's : Needs assistance/impaired Eating/Feeding: Set  up;Sitting   Grooming: Set up;Sitting   Upper Body Bathing: Set up;Supervision/ safety;Sitting   Lower Body Bathing: Min guard   Upper Body Dressing : Supervision/safety;Set up;Sitting   Lower Body Dressing: Min guard;Sit to/from stand Lower Body Dressing Details (indicate cue type and reason): Pt able to don/doff socks while long sitting in bed. Min Guard A for safety in standing Toilet Transfer: Min guard;Ambulation (simulated in room)           Functional mobility during ADLs: Min guard (with and without RW) General ADL Comments: Pt presenting with poor activity tolerance due to fatigue and pain at BLEs     Vision         Perception     Praxis      Pertinent Vitals/Pain Pain Assessment: Faces Faces Pain Scale: Hurts whole lot Pain Location: BLE - increased with weight bearing Pain Descriptors / Indicators: Discomfort;Grimacing ("feels like I am walking on stone") Pain Intervention(s): Monitored during session;Limited activity within patient's tolerance;Repositioned     Hand Dominance Right   Extremity/Trunk Assessment Upper Extremity Assessment Upper Extremity Assessment: Generalized weakness   Lower Extremity Assessment Lower Extremity Assessment: Generalized weakness   Cervical / Trunk Assessment Cervical / Trunk Assessment: Kyphotic   Communication Communication Communication: Other (comment) (speaks some English, prefers Arabic)   Cognition Arousal/Alertness: Awake/alert Behavior During Therapy: WFL for tasks assessed/performed;Flat affect Overall Cognitive Status: Within Functional Limits for tasks assessed  General Comments: very polite and cooperative   General Comments  Spo2 94-92% on RA    Exercises     Shoulder Instructions      Home Living Family/patient expects to be discharged to:: Private residence Living Arrangements: Children Available Help at Discharge: Family;Available  PRN/intermittently Type of Home: House Home Access: Level entry     Home Layout: Able to live on main level with bedroom/bathroom;Two level Alternate Level Stairs-Number of Steps: 13 Alternate Level Stairs-Rails: Right Bathroom Shower/Tub: Occupational psychologist: Handicapped height Bathroom Accessibility: Yes   Home Equipment: Environmental consultant - 2 wheels;Wheelchair - [employer];Shower seat - built in;Grab bars - tub/shower   Additional Comments: pt lives with her daughter who works at [employer] as a professor as well as 3rd shift at [employer]      Prior Functioning/Environment Level of Independence: Independent with assistive device(s)        Comments: pt would sometimes use a RW and other days not. Only used it if she felt sick.         OT Problem List: Decreased strength;Decreased activity tolerance;Decreased range of motion;Impaired balance (sitting and/or standing);Decreased knowledge of use of DME or AE;Decreased knowledge of precautions;Pain      OT Treatment/Interventions: Self-care/ADL training;Therapeutic exercise;Energy conservation;DME and/or AE instruction;Therapeutic activities;Patient/family education    OT Goals(Current goals can be found in the care plan section) Acute Rehab OT Goals Patient Stated Goal: feel better, go home OT Goal Formulation: With patient Time For Goal Achievement: 11/16/20 Potential to Achieve Goals: Good  OT Frequency: Min 2X/week   Barriers to D/C:            Co-evaluation              AM-PAC OT "6 Clicks" Daily Activity     Outcome Measure Help from another person eating meals?: A Little Help from another person taking care of personal grooming?: A Little Help from another person toileting, which includes using toliet, bedpan, or urinal?: A Little Help from another person bathing (including washing, rinsing, drying)?: A Little Help from another person to put on and taking off regular upper body clothing?: A  Little Help from another person to put on and taking off regular lower body clothing?: A Little 6 Click Score: 18   End of Session Equipment Utilized During Treatment: Rolling walker Nurse Communication: Mobility status  Activity Tolerance: Patient tolerated treatment well;Patient limited by pain Patient left: in bed;with call bell/phone within reach;Other (comment) (with transport team)  OT Visit Diagnosis: Other abnormalities of gait and mobility (R26.89);Unsteadiness on feet (R26.81);Muscle weakness (generalized) (M62.81)                Time: 9417-4081 OT Time Calculation (min): 24 min Charges:  OT General Charges $OT Visit: 1 Visit OT Evaluation $OT Eval Moderate Complexity: 1 Mod OT Treatments $Self Care/Home Management : 8-22 mins  Yuuki Skeens MSOT, OTR/L Acute Rehab Pager: [phone] Office: [place] 11/02/2020, 1:04 PM

## 2020-11-02 NOTE — Discharge Instructions (Signed)

## 2020-11-02 NOTE — Progress Notes (Addendum)
PROGRESS NOTE    Anne Roach  KTG:[id number] DOB: February 15, 1933 DOA: 10/31/2020 PCP: Lucianne Lei, MD   Brief Narrative: 86 year old with past medical history significant for chronic A. fib, hypertension, hyperlipidemia, diabetes type 2 who presents complaining of nausea and vomiting.  Patient developed severe epigastric pain multiple episode of nausea and vomiting.  Denies dysuria or fever.  She also reported shortness of breath. Evaluation in the ED patient was tachycardic heart rate 121, tachypnea respiration rate 28, oxygen saturation 90% on 2 L, leukocytosis white count 17 lactic acid 3.1.  Chest x-ray show degree of pulmonary vascular congestion and pleural effusion, ill-defined airspace opacity in the lung bases.  UA with trace of leukocyte.  CT abdomen and pelvis: Showed diverticulosis without diverticulitis.  Moderate to large right side pleural effusion, no other intra-abdominal pathology.   Assessment & Plan:   Principal Problem:   SIRS (systemic inflammatory response syndrome) (HCC) Active Problems:   Type 2 diabetes mellitus (HCC)   Chronic atrial fibrillation (HCC)   PNA (pneumonia)   Abdominal pain   GERD (gastroesophageal reflux disease)   Chronic diastolic CHF (congestive heart failure) (HCC)     Pressure Injury 03/21/17 Stage I -  Intact skin with non-blanchable redness of a localized area usually over a bony prominence. (Active)  03/21/17 0757  Location: Buttocks  Location Orientation: Right;Left  Staging: Stage I -  Intact skin with non-blanchable redness of a localized area usually over a bony prominence.  Wound Description (Comments):   Present on Admission: Yes    1-Severe sepsis: Patient presented with fever, leukocytosis, lactic acid of 3.1, tachypnea, tachycardia source of infection likely pneumonia -Received initially V vancomycin and cefepime -Blood cultures, urine culture: 2000 enterococcus Avium, 2000 colonies staph epidermidis.  -WBC  normalized. -Now on Omnicef, might need to extend therapy depending on thoracentesis.   2-Pneumonia, Right side pleural effusion: -Concern for parapneumonic effusion -Pulmonary consulted, patient and family declined thoracentesis at this time. -Continue with antibiotics. -Received lasix 20 mg IV on 12/16 and 12/17 -ECHO low normal EF 50-55% , tricuspid regurgitation moderate,.  -Repeated chest x ray showed mildly worse pleural effusion.  -Patient agrees to have evaluation again for thoracentesis.   3-Persistent A fib Continue with Cardizem and eliquis.  Rate controlled.   4-Abdominal pain , nausea vomiting.  Gallbladder unremarkable, CT abdomen pelvis negative for intra abdominal pathology.  Support care.  Resolved.  Might have been related to underline illness.   5-Diabetes type II: Last hemoglobin A1c 6.3: Continue to hold Metformin. Continue with a sliding scale insulin  History of gout: Patient without acute flare: Continue with allopurinol.  Chest pain;  Patient complained of chest pain this afternoon, left side worse with breathing, pressure time.  -EKG showed chronic A fib, no ST elevation.  -Nitroglycerin PRN -cycle cardiac enzymes   Stable exophytic hypodense lesion from the inferior aspect of the uncinate process of the pancreas, likely a benign or indolent process such as a side branch IPMN or a small chronic pseudocyst. MRI without and with contrast may provide better characterization.    Estimated body mass index is 29.17 kg/m as calculated from the following:   Height as of this encounter: 5\' 5"  (1.651 m).   Weight as of this encounter: 79.5 kg.   DVT prophylaxis: Eliquis Code Status: Full code Family Communication: Care discussed with patient and daughter who was at bedside Disposition Plan:  Status is: Inpatient  Remains inpatient appropriate because:IV treatments appropriate due to intensity of illness  or inability to take PO   Dispo: The  patient is from: Home              Anticipated d/c is to: Home              Anticipated d/c date is: 2 days              Patient currently is not medically stable to d/c.        Consultants:   Pulmonary   Procedures:   ECHO   Antimicrobials:  Vancomycin and cefepime.   Subjective: She was having SOB last night, she is breathing better this morning.  Her HR increase today with exertion, while she was having pain.  She was able to have a bowel movement. She feels better  after that.   Objective: Vitals:   11/01/20 1722 11/01/20 2025 11/02/20 0449 11/02/20 0951  BP: 114/89 103/65 107/61 110/68  Pulse: 92 78 82 80  Resp: 18 (!) 22 18 16   Temp: 98.3 F (36.8 C) 98.5 F (36.9 C) 99.5 F (37.5 C) 98.7 F (37.1 C)  TempSrc:  Oral Oral Oral  SpO2: 94% 94% 99% 92%  Weight:  79.5 kg    Height:        Intake/Output Summary (Last 24 hours) at 11/02/2020 1232 Last data filed at 11/02/2020 0900 Gross per 24 hour  Intake 420 ml  Output 2500 ml  Net -2080 ml   Filed Weights   10/31/20 1836 10/31/20 2305 11/01/20 2025  Weight: 77.2 kg 79 kg 79.5 kg    Examination:  General exam: NAD Respiratory system: Decreased breath sound on the right.  Cardiovascular system: S 1, S 2 IRR Gastrointestinal system: BS present, soft, nt, nd Central nervous system: alert and orienetd. Extremities: no edema    Data Reviewed: I have personally reviewed following labs and imaging studies  CBC: Recent Labs  Lab 10/31/20 0807 11/01/20 0344 11/02/20 0155  WBC 17.5* 7.2 5.6  NEUTROABS 14.3*  --   --   HGB 12.6 10.0* 10.0*  HCT 39.7 31.3* 30.8*  MCV 98.3 97.2 96.0  PLT 186 143* 245*   Basic Metabolic Panel: Recent Labs  Lab 10/31/20 0807 11/01/20 0344 11/02/20 0155  NA 138 139 140  K 4.1 4.0 4.0  CL 102 105 106  CO2 24 25 24   GLUCOSE 131* 114* 102*  BUN 15 17 16   CREATININE 1.25* 1.17* 1.16*  CALCIUM 9.5 8.6* 8.7*  MG  --  1.9  --    GFR: Estimated Creatinine  Clearance: 36.9 mL/min (A) (by C-G formula based on SCr of 1.16 mg/dL (H)). Liver Function Tests: Recent Labs  Lab 10/31/20 0807 11/02/20 0155  AST 22 18  ALT 15 12  ALKPHOS 43 31*  BILITOT 0.5 0.8  PROT 7.0 5.5*  ALBUMIN 3.5 2.7*   No results for input(s): LIPASE, AMYLASE in the last 168 hours. No results for input(s): AMMONIA in the last 168 hours. Coagulation Profile: Recent Labs  Lab 10/31/20 0807  INR 1.4*   Cardiac Enzymes: No results for input(s): CKTOTAL, CKMB, CKMBINDEX, TROPONINI in the last 168 hours. BNP (last 3 results) No results for input(s): PROBNP in the last 8760 hours. HbA1C: Recent Labs    10/31/20 1857  HGBA1C 5.9*   CBG: Recent Labs  Lab 11/01/20 1207 11/01/20 1721 11/01/20 2037 11/02/20 0639 11/02/20 1201  GLUCAP 84 101* 89 101* 121*   Lipid Profile: No results for input(s): CHOL, HDL, LDLCALC, TRIG, CHOLHDL, LDLDIRECT  in the last 72 hours. Thyroid Function Tests: No results for input(s): TSH, T4TOTAL, FREET4, T3FREE, THYROIDAB in the last 72 hours. Anemia Panel: No results for input(s): VITAMINB12, FOLATE, FERRITIN, TIBC, IRON, RETICCTPCT in the last 72 hours. Sepsis Labs: Recent Labs  Lab 10/31/20 0904 10/31/20 1058 10/31/20 1857  PROCALCITON  --   --  <0.10  LATICACIDVEN 3.1* 2.8* 2.2*    Recent Results (from the past 240 hour(s))  Resp Panel by RT-PCR (Flu A&B, Covid) Nasopharyngeal Swab     Status: None   Collection Time: 10/31/20  8:44 AM   Specimen: Nasopharyngeal Swab; Nasopharyngeal(NP) swabs in vial transport medium  Result Value Ref Range Status   SARS Coronavirus 2 by RT PCR NEGATIVE NEGATIVE Final    Comment: (NOTE) SARS-CoV-2 target nucleic acids are NOT DETECTED.  The SARS-CoV-2 RNA is generally detectable in upper respiratory specimens during the acute phase of infection. The lowest concentration of SARS-CoV-2 viral copies this assay can detect is 138 copies/mL. A negative result does not preclude  SARS-Cov-2 infection and should not be used as the sole basis for treatment or other patient management decisions. A negative result may occur with  improper specimen collection/handling, submission of specimen other than nasopharyngeal swab, presence of viral mutation(s) within the areas targeted by this assay, and inadequate number of viral copies(<138 copies/mL). A negative result must be combined with clinical observations, patient history, and epidemiological information. The expected result is Negative.  Fact Sheet for Patients:  EntrepreneurPulse.com.au  Fact Sheet for Healthcare Providers:  IncredibleEmployment.be  This test is no t yet approved or cleared by the [place] FDA and  has been authorized for detection and/or diagnosis of SARS-CoV-2 by FDA under an Emergency Use Authorization (EUA). This EUA will remain  in effect (meaning this test can be used) for the duration of the COVID-19 declaration under Section 564(b)(1) of the Act, 21 U.S.C.section 360bbb-3(b)(1), unless the authorization is terminated  or revoked sooner.       Influenza A by PCR NEGATIVE NEGATIVE Final   Influenza B by PCR NEGATIVE NEGATIVE Final    Comment: (NOTE) The Xpert Xpress SARS-CoV-2/FLU/RSV plus assay is intended as an aid in the diagnosis of influenza from Nasopharyngeal swab specimens and should not be used as a sole basis for treatment. Nasal washings and aspirates are unacceptable for Xpert Xpress SARS-CoV-2/FLU/RSV testing.  Fact Sheet for Patients: EntrepreneurPulse.com.au  Fact Sheet for Healthcare Providers: IncredibleEmployment.be  This test is not yet approved or cleared by the [place] FDA and has been authorized for detection and/or diagnosis of SARS-CoV-2 by FDA under an Emergency Use Authorization (EUA). This EUA will remain in effect (meaning this test can be used) for the duration of  the COVID-19 declaration under Section 564(b)(1) of the Act, 21 U.S.C. section 360bbb-3(b)(1), unless the authorization is terminated or revoked.  Performed at [facility], [place] [address], [place], [place] [address]   Blood Culture (routine x 2)     Status: None (Preliminary result)   Collection Time: 10/31/20  9:04 AM   Specimen: BLOOD  Result Value Ref Range Status   Specimen Description BLOOD LEFT ANTECUBITAL  Final   Special Requests   Final    BOTTLES DRAWN AEROBIC AND ANAEROBIC Blood Culture results may not be optimal due to an inadequate volume of blood received in culture bottles   Culture   Final    NO GROWTH 2 DAYS Performed at [facility], [place],  [place] [address]    Report Status PENDING  Incomplete  Blood Culture (routine x 2)     Status: None (Preliminary result)   Collection Time: 10/31/20  9:09 AM   Specimen: BLOOD LEFT FOREARM  Result Value Ref Range Status   Specimen Description BLOOD LEFT FOREARM  Final   Special Requests   Final    BOTTLES DRAWN AEROBIC AND ANAEROBIC Blood Culture adequate volume   Culture   Final    NO GROWTH 2 DAYS Performed at [facility], [place] [address], [place], [place] [address]    Report Status PENDING  Incomplete  Urine culture     Status: Abnormal (Preliminary result)   Collection Time: 10/31/20 10:58 AM   Specimen: In/Out Cath Urine  Result Value Ref Range Status   Specimen Description IN/OUT CATH URINE  Final   Special Requests NONE  Final   Culture (A)  Final    2,000 COLONIES/mL ENTEROCOCCUS AVIUM 2,000 COLONIES/mL STAPHYLOCOCCUS EPIDERMIDIS SUSCEPTIBILITIES TO FOLLOW Performed at [facility], [place] [address], [place], [place] [address]    Report Status PENDING  Incomplete         Radiology Studies: CT ABDOMEN PELVIS WO CONTRAST  Result Date: 10/31/2020 CLINICAL DATA:  86 year old female with epigastric pain. EXAM: CT ABDOMEN AND PELVIS WITHOUT CONTRAST TECHNIQUE:  Multidetector CT imaging of the abdomen and pelvis was performed following the standard protocol without IV contrast. COMPARISON:  CT abdomen pelvis dated 03/20/2017. FINDINGS: Evaluation of this exam is limited in the absence of intravenous contrast. Lower chest: Partially visualized moderate to large right and small left pleural effusions. There is associated partial compressive atelectasis of the lower lobes. Pneumonia is not excluded. There is mild cardiomegaly. No intra-abdominal free air. Small free fluid in the pelvis. Hepatobiliary: Slight irregularity of the liver contour may represent early changes of cirrhosis. Clinical correlation is recommended. No intrahepatic biliary dilatation. The gallbladder is unremarkable. Pancreas: There is a 2.6 x 2.1 x 2.6 cm exophytic hypodense lesion from the inferior aspect of the uncinate process of the pancreas with small calcific foci. This is not significantly changed since the study of 2018, likely a benign or indolent process such as a side branch IPMN or a small chronic pseudocyst. MRI without and with contrast may provide better characterization. No acute inflammatory changes. No dilatation of the main pancreatic duct. Spleen: Normal in size without focal abnormality. Adrenals/Urinary Tract: The adrenal glands unremarkable. Mild bilateral renal parenchyma atrophy. A 15 mm exophytic hypodense lesion from the lateral interpolar right kidney is not characterized but may represent a cyst. There is no hydronephrosis or nephrolithiasis on either side. The visualized ureters and urinary bladder appear unremarkable. Stomach/Bowel: Small scattered colonic diverticula without active inflammatory changes. There is no bowel obstruction or active inflammation. Appendectomy. Vascular/Lymphatic: Moderate aortoiliac atherosclerotic disease. The IVC is unremarkable. No portal venous gas. There is no adenopathy. There is mild haziness of the mesentery with multiple top-normal lymph  nodes with a "misty mesentery" appearance. This finding is nonspecific but may be related to underlying inflammatory/infectious etiology. Reproductive: The uterus and ovaries are grossly unremarkable. Other: None Musculoskeletal: Osteopenia with multilevel degenerative changes of the spine and multilevel disc desiccation and vacuum phenomena. No acute osseous pathology. IMPRESSION: 1. Moderate to large right and small left pleural effusions with associated partial compressive atelectasis of the lower lobes. Pneumonia is not excluded. Clinical correlation is recommended. 2. No acute intra-abdominal or pelvic pathology. No bowel obstruction. 3. Colonic diverticulosis. 4. Stable  exophytic hypodense lesion from the inferior aspect of the uncinate process of the pancreas, likely a benign or indolent process such as a side branch IPMN or a small chronic pseudocyst. MRI without and with contrast may provide better characterization. 5. Aortic Atherosclerosis (ICD10-I70.0). Electronically Signed   By: Anner Crete M.D.   On: 10/31/2020 18:17   DG Chest 2 View  Result Date: 11/02/2020 CLINICAL DATA:  Chest pain today EXAM: CHEST - 2 VIEW COMPARISON:  10/31/2020 FINDINGS: Mild progression of small right effusion and right lower lobe atelectasis. No change left lower lobe atelectasis and small left effusion. Negative for heart failure. Atherosclerotic aortic arch. IMPRESSION: Mild progression of right lower lobe atelectasis and effusion. No change left lower lobe atelectasis and effusion. Electronically Signed   By: Franchot Gallo M.D.   On: 11/02/2020 11:12   ECHOCARDIOGRAM COMPLETE  Result Date: 11/01/2020    ECHOCARDIOGRAM REPORT   [name] Date of Exam: 11/01/2020 Medical Rec #:  [id number]        Height:       65.0 in Accession #:    [id number]       Weight:       174.2 lb Date of Birth:  1933-05-09        BSA:          1.865 m Patient Age:    20 years         BP:           101/50 mmHg  Patient Gender: F                HR:           85 bpm. Exam Location:  Inpatient Procedure: 2D Echo, Cardiac Doppler and Color Doppler Indications:    R94.31 Abnormal EKG  History:        Patient has prior history of Echocardiogram examinations, most                 recent 01/03/2019. CHF, Arrythmias:Atrial Fibrillation; Risk                 Factors:Hypertension and Diabetes.  Sonographer:    Jonelle Sidle Dance Referring Phys: [id number] Ovadia Lopp A Jamarl Pew IMPRESSIONS  1. Left ventricular ejection fraction, by estimation, is 50 to 55%. The left ventricle has low normal function. The left ventricle has no regional wall motion abnormalities. There is mild left ventricular hypertrophy. Left ventricular diastolic function  could not be evaluated.  2. Right ventricular systolic function is moderately reduced. The right ventricular size is normal. There is mildly elevated pulmonary artery systolic pressure.  3. Left atrial size was severely dilated.  4. Right atrial size was mildly dilated.  5. A small pericardial effusion is present.  6. The mitral valve is normal in structure. Moderate mitral valve regurgitation. No evidence of mitral stenosis. Moderate mitral annular calcification.  7. Tricuspid valve regurgitation is moderate.  8. The aortic valve is tricuspid. Aortic valve regurgitation is mild. Mild aortic valve sclerosis is present, with no evidence of aortic valve stenosis.  9. The inferior vena cava is dilated in size with >50% respiratory variability, suggesting right atrial pressure of 8 mmHg. FINDINGS  Left Ventricle: Left ventricular ejection fraction, by estimation, is 50 to 55%. The left ventricle has low normal function. The left ventricle has no regional wall motion abnormalities. The left ventricular internal cavity size was normal in size. There is mild left ventricular hypertrophy. Left ventricular diastolic  function could not be evaluated due to atrial fibrillation. Left ventricular diastolic function could not be  evaluated. Right Ventricle: The right ventricular size is normal. Right ventricular systolic function is moderately reduced. There is mildly elevated pulmonary artery systolic pressure. The tricuspid regurgitant velocity is 2.63 m/s, and with an assumed right atrial pressure of 8 mmHg, the estimated right ventricular systolic pressure is 09.4 mmHg. Left Atrium: Left atrial size was severely dilated. Right Atrium: Right atrial size was mildly dilated. Pericardium: A small pericardial effusion is present. Mitral Valve: The mitral valve is normal in structure. Moderate mitral annular calcification. Moderate mitral valve regurgitation. No evidence of mitral valve stenosis. Tricuspid Valve: The tricuspid valve is normal in structure. Tricuspid valve regurgitation is moderate . No evidence of tricuspid stenosis. Aortic Valve: The aortic valve is tricuspid. Aortic valve regurgitation is mild. Mild aortic valve sclerosis is present, with no evidence of aortic valve stenosis. Pulmonic Valve: The pulmonic valve was normal in structure. Pulmonic valve regurgitation is trivial. No evidence of pulmonic stenosis. Aorta: The aortic root is normal in size and structure. Venous: The inferior vena cava is dilated in size with greater than 50% respiratory variability, suggesting right atrial pressure of 8 mmHg. IAS/Shunts: No atrial level shunt detected by color flow Doppler.  LEFT VENTRICLE PLAX 2D LVIDd:         4.81 cm LVIDs:         4.11 cm LV PW:         1.50 cm LV IVS:        1.14 cm LVOT diam:     2.20 cm LV SV:         46 LV SV Index:   25 LVOT Area:     3.80 cm  RIGHT VENTRICLE          IVC RV Basal diam:  2.72 cm  IVC diam: 2.43 cm TAPSE (M-mode): 1.3 cm LEFT ATRIUM              Index       RIGHT ATRIUM           Index LA diam:        5.30 cm  2.84 cm/m  RA Area:     21.50 cm LA Vol (A2C):   109.0 ml 58.44 ml/m RA Volume:   67.00 ml  35.92 ml/m LA Vol (A4C):   114.0 ml 61.12 ml/m LA Biplane Vol: 118.0 ml 63.26 ml/m   AORTIC VALVE LVOT Vmax:   61.65 cm/s LVOT Vmean:  41.400 cm/s LVOT VTI:    0.121 m  AORTA Ao Root diam: 3.40 cm Ao Asc diam:  3.70 cm MITRAL VALVE                TRICUSPID VALVE MV Area (PHT): 2.58 cm     TR Peak grad:   27.7 mmHg MV Decel Time: 294 msec     TR Vmax:        263.00 cm/s MV E velocity: 112.50 cm/s                             SHUNTS                             Systemic VTI:  0.12 m  Systemic Diam: 2.20 cm Kirk Ruths MD Electronically signed by Kirk Ruths MD Signature Date/Time: 11/01/2020/1:42:41 PM    Final         Scheduled Meds: . allopurinol  100 mg Oral Daily  . apixaban  5 mg Oral BID  . cefdinir  300 mg Oral Q12H  . diltiazem  240 mg Oral Daily  . furosemide  20 mg Intravenous Once  . gabapentin  100 mg Oral TID  . insulin aspart  0-9 Units Subcutaneous TID WC  . magnesium oxide  400 mg Oral Daily  . pantoprazole  40 mg Oral Daily  . polyethylene glycol  17 g Oral BID  . potassium chloride  20 mEq Oral Once  . saccharomyces boulardii  250 mg Oral BID  . senna  1 tablet Oral Daily   Continuous Infusions:   LOS: 2 days    Time spent: 35 minutes/     Myrl Lazarus A Sarrinah Gardin, MD [facility]   If 7PM-7AM, please contact night-coverage [email or web address]  11/02/2020, 12:32 PM

## 2020-11-02 NOTE — Progress Notes (Signed)
SATURATION QUALIFICATIONS: (This note is used to comply with regulatory documentation for home oxygen)  Patient Saturations on Room Air at Rest = 94%  Patient Saturations on Room Air while Ambulating = 94-92%   Please briefly explain why patient needs home oxygen: Pt able to perform ADLs and mobility on RA. No need for supplemental O2.   [place], OTR/L Acute Rehab Pager: [phone] Office: [phone]

## 2020-11-02 NOTE — Telephone Encounter (Signed)
-----   Message from Lanier Clam, MD sent at 11/02/2020  2:57 PM EST ----- Regarding: clinic f/u Can I see this patient in 30 min Hospital f/u appt in 4-6 weeks?   Thanks! Matt

## 2020-11-02 NOTE — Consult Note (Signed)
NAME:  Anne Roach, MRN:  [id number], DOB:  [birth year], LOS: 2 ADMISSION DATE:  10/31/2020, CONSULTATION DATE:  11/01/20 REFERRING MD:  Frederic Jericho, CHIEF COMPLAINT:  N/V   Brief History   86 year old woman w/ hx Afib on AC, HTN p/w N/V found to have possible pneumonia and parapneumonic effusion.  History of present illness   86 year old woman with hx of Afib on AC, HTN p/w epigastric discomfort, N/V.  Placed on O2 for unclear reason in ER.  CT Abd noted bilateral R>L pleural effusions for which pulmonary is consulted.  Currently, patient denies all symptoms. N/V/abd pain have resolved.  No SOB, no orthopnea, mild dry cough, chronic.  No fevers, chills, chest pain, pleurisy.  Past Medical History  Afib on AC HTN HLD DM  Significant Hospital Events   12/15 admitted  Consults:  Pulm  Procedures:  N/A  Significant Diagnostic Tests:  CT A/P IMPRESSION: 1. Moderate to large right and small left pleural effusions with associated partial compressive atelectasis of the lower lobes. Pneumonia is not excluded. Clinical correlation is recommended. 2. No acute intra-abdominal or pelvic pathology. No bowel obstruction. 3. Colonic diverticulosis. 4. Stable exophytic hypodense lesion from the inferior aspect of the uncinate process of the pancreas, likely a benign or indolent process such as a side branch IPMN or a small chronic pseudocyst. MRI without and with contrast may provide better characterization. 5. Aortic Atherosclerosis (ICD10-I70.0).  Micro Data:  COVID neg Pct neg Blood cx pending  Antimicrobials:  Vanc/cefepime/flagyl   Interim history/subjective:  Asymptomatic, off O2. CXR looks similar to prior, bilateral R>L effusions. TTE with LVH, severely dilated LA, moderate MVR, mild AI, RA dilation, reduced RV function.  Objective   Blood pressure 110/68, pulse 80, temperature 98.7 F (37.1 C), temperature source Oral, resp. rate 16, height 5\' 5"  (1.651 m), weight  79.5 kg, SpO2 92 %.        Intake/Output Summary (Last 24 hours) at 11/02/2020 1452 Last data filed at 11/02/2020 0900 Gross per 24 hour  Intake 420 ml  Output 2200 ml  Net -1780 ml   Filed Weights   10/31/20 1836 10/31/20 2305 11/01/20 2025  Weight: 77.2 kg 79 kg 79.5 kg    Examination: Constitutional: elderly woman in no acute distress Eyes: eyes are anicteric, reactive to light Ears, nose, mouth, and throat: mucous membranes moist, trachea midline Cardiovascular: heart sounds are regular, ext are warm to touch. no edema Respiratory: Crackles in bases, otherwise clear Gastrointestinal: abdomen is soft with + BS Skin: No rashes, normal turgor Neurologic: moves all 4 ext to command Psychiatric: pleasant, conversant, AOx3  Resolved Hospital Problem list   n/a  Assessment & Plan:  Gastroenteritis NOS improved +SIRS vs. hypovolemia resolved Question chemical aspiration although hx not very consistent Chronic R effusion (noted on [place] 8/27) with L effusion Constipation Question cystitis CKD   Bilateral effusions in setting of TTE with significant LA HTN on TTE. Suspect related to volume overload stemming from L sided structural heart disease. Recommend ongoing aggressive diuresis as tolerated. She has no symptoms, off oxygen. No benefit for thoracentesis at this time.   We will sign off. Sent request for outpatient pulmonary follow up.    Labs   CBC: Recent Labs  Lab 10/31/20 0807 11/01/20 0344 11/02/20 0155  WBC 17.5* 7.2 5.6  NEUTROABS 14.3*  --   --   HGB 12.6 10.0* 10.0*  HCT 39.7 31.3* 30.8*  MCV 98.3 97.2 96.0  PLT 186  143* 144*    Basic Metabolic Panel: Recent Labs  Lab 10/31/20 0807 11/01/20 0344 11/02/20 0155  NA 138 139 140  K 4.1 4.0 4.0  CL 102 105 106  CO2 24 25 24   GLUCOSE 131* 114* 102*  BUN 15 17 16   CREATININE 1.25* 1.17* 1.16*  CALCIUM 9.5 8.6* 8.7*  MG  --  1.9  --    GFR: Estimated Creatinine Clearance: 36.9 mL/min (A) (by C-G  formula based on SCr of 1.16 mg/dL (H)). Recent Labs  Lab 10/31/20 0807 10/31/20 0904 10/31/20 1058 10/31/20 1857 11/01/20 0344 11/02/20 0155  PROCALCITON  --   --   --  <0.10  --   --   WBC 17.5*  --   --   --  7.2 5.6  LATICACIDVEN  --  3.1* 2.8* 2.2*  --   --     Liver Function Tests: Recent Labs  Lab 10/31/20 0807 11/02/20 0155  AST 22 18  ALT 15 12  ALKPHOS 43 31*  BILITOT 0.5 0.8  PROT 7.0 5.5*  ALBUMIN 3.5 2.7*   No results for input(s): LIPASE, AMYLASE in the last 168 hours. No results for input(s): AMMONIA in the last 168 hours.  ABG No results found for: PHART, PCO2ART, PO2ART, HCO3, TCO2, ACIDBASEDEF, O2SAT   Coagulation Profile: Recent Labs  Lab 10/31/20 0807  INR 1.4*    Cardiac Enzymes: No results for input(s): CKTOTAL, CKMB, CKMBINDEX, TROPONINI in the last 168 hours.  HbA1C: Hgb A1c MFr Bld  Date/Time Value Ref Range Status  10/31/2020 06:57 PM 5.9 (H) 4.8 - 5.6 % Final    Comment:    (NOTE) Pre diabetes:          5.7%-6.4%  Diabetes:              >6.4%  Glycemic control for   <7.0% adults with diabetes   07/12/2020 04:45 AM 6.3 (H) 4.8 - 5.6 % Final    Comment:    (NOTE) Pre diabetes:          5.7%-6.4%  Diabetes:              >6.4%  Glycemic control for   <7.0% adults with diabetes     CBG: Recent Labs  Lab 11/01/20 1207 11/01/20 1721 11/01/20 2037 11/02/20 0639 11/02/20 1201  GLUCAP 84 101* 89 101* 121*    Review of Systems:    Positive Symptoms in bold:  Constitutional fevers, chills, weight loss, fatigue, anorexia, malaise  Eyes decreased vision, double vision, eye irritation  Ears, Nose, Mouth, Throat sore throat, trouble swallowing, sinus congestion  Cardiovascular chest pain, paroxysmal nocturnal dyspnea, lower ext edema, palpitations   Respiratory SOB, cough, DOE, hemoptysis, wheezing  Gastrointestinal nausea, vomiting, diarrhea  Genitourinary burning with urination, trouble urinating  Musculoskeletal  joint aches, joint swelling, back pain  Integumentary  rashes, skin lesions  Neurological focal weakness, focal numbness, trouble speaking, headaches  Psychiatric depression, anxiety, confusion  Endocrine polyuria, polydipsia, cold intolerance, heat intolerance  Hematologic abnormal bruising, abnormal bleeding, unexplained nose bleeds  Allergic/Immunologic recurrent infections, hives, swollen lymph nodes     Past Medical History  She,  has a past medical history of Atrial fibrillation (HCC), Chronic diastolic CHF (congestive heart failure) ([place]) (10/31/2020), DM (diabetes mellitus) ([place]), HTN (hypertension), and Tremor.   Surgical History    Past Surgical History:  Procedure Laterality Date  . APPENDECTOMY    . CARPAL TUNNEL RELEASE    . CATARACT EXTRACTION Bilateral   .  HERNIA REPAIR       Social History   reports that she has never smoked. She has never used smokeless tobacco. She reports that she does not drink alcohol and does not use drugs.   Family History   Her family history includes Asthma in her brother; Diabetes in her father.   Allergies No Known Allergies   Home Medications  Prior to Admission medications   Medication Sig Start Date End Date Taking? Authorizing Provider  acetaminophen (TYLENOL) 500 MG tablet Take 1,000 mg by mouth every 6 (six) hours as needed for mild pain or headache.   Yes [provider]  allopurinol (ZYLOPRIM) 100 MG tablet Take 1 tablet (100 mg total) by mouth daily. 03/14/20  Yes Edrick Kins, DPM  cholecalciferol (VITAMIN D3) 25 MCG (1000 UNIT) tablet Take 1,000 Units by mouth daily.   Yes [provider]  colchicine 0.6 MG tablet Take 1 tablet (0.6 mg total) by mouth daily. Patient taking differently: Take 0.6 mg by mouth daily as needed (for flare up). 10/17/20  Yes Edrick Kins, DPM  diltiazem (CARDIZEM CD) 240 MG 24 hr capsule TAKE 1 CAPSULE(240 MG) BY MOUTH DAILY Patient taking differently: Take 240 mg by mouth  daily. 08/21/20  Yes Josue Hector, MD  ELIQUIS 5 MG TABS tablet TAKE 1 TABLET(5 MG) BY MOUTH TWICE DAILY Patient taking differently: Take 5 mg by mouth 2 (two) times daily. 09/18/20  Yes Josue Hector, MD  gabapentin (NEURONTIN) 100 MG capsule Take 1 capsule (100 mg total) by mouth 3 (three) times daily. 10/17/20  Yes Edrick Kins, DPM  magnesium oxide (MAG-OX) 400 (241.3 Mg) MG tablet Take 1 tablet (400 mg total) by mouth daily. 07/15/20  Yes Geradine Girt, DO  meclizine (ANTIVERT) 25 MG tablet Take 1 tablet (25 mg total) by mouth 3 (three) times daily as needed for dizziness. 09/03/20  Yes Josue Hector, MD  metFORMIN (GLUCOPHAGE-XR) 750 MG 24 hr tablet Take 750 mg by mouth daily. 05/14/15  Yes [provider]  Multiple Vitamins-Minerals (ICAPS AREDS 2 PO) Take 2 capsules by mouth daily.   Yes [provider]  pantoprazole (PROTONIX) 40 MG tablet Take 40 mg by mouth daily as needed (heartburn). 08/22/20  Yes [provider]  [place] test strip  10/06/20   [provider]

## 2020-11-02 NOTE — Telephone Encounter (Signed)
Was able to get patient scheduled for 12/04/2020. Will send [place] a message to let him know.

## 2020-11-02 NOTE — TOC Initial Note (Signed)
Transition of Care [facility]) - Initial/Assessment Note    Patient Details  Name: Anne Roach MRN: [id number] Date of Birth: 1933/09/27  Transition of Care [facility]) CM/SW Contact:    Bartholomew Crews, RN Phone Number: [phone] 11/02/2020, 4:23 PM  Clinical Narrative:                  Spoke with daughter, [name], on the phone to discuss transition planning. PTA patient was home with daughter. Has all needed DME to include shower chair with walk in shower; elevated toilet seat; 2 BSCs; RW; rollator; wheelchair; and a grab bar although it was never installed. No problems getting in or out of car. No problems with medication. Discussed interpreter needs for patient. Daughter stated that there was an in person interpreter who saw patient briefly this morning, but was not around when needed later. Daughter stated that patient can understand English if you speak slowly. Daughter did state that patient normally takes metformin for her diabetes and that patient is adamant that she will not receive insulin. Discussed HH recommendations. Patient has had [facility] for nursing and PT in the past, and daughter stated that both professionals were very good. Referral accepted by [facility] for RN, PT. Patient will need HH RN, PT orders with Face to Face.  TOC following for transition needs.   Expected Discharge Plan: [place] Barriers to Discharge: Continued Medical Work up   Patient Goals and CMS Choice Patient states their goals for this hospitalization and ongoing recovery are:: home with daughter CMS Medicare.gov Compare Post Acute Care list provided to:: Patient Represenative (must comment) (daughter, [name]) Choice offered to / list presented to : Adult Children [name])  Expected Discharge Plan and Services Expected Discharge Plan: [place] In-house Referral: Interpreting Services Discharge Planning Services: CM Consult Post Acute Care Choice: [place] arrangements  for the past 2 months: Single Family Home                 DME Arranged: N/A DME Agency: NA       HH Arranged: RN,PT [place] Agency: [place] Date [place]: 11/02/20 Time HH Agency Contacted: 67 Representative spoke with at [place]: Tommi Rumps  Prior Living Arrangements/Services Living arrangements for the past 2 months: [place] with:: [place] Patient language and need for interpreter reviewed:: Yes        Need for Family Participation in Patient Care: Yes (Comment) Care giver support system in place?: Yes (comment) Current home services: DME Criminal Activity/Legal Involvement Pertinent to Current Situation/Hospitalization: No - Comment as needed  Activities of Daily Living Home Assistive Devices/Equipment: None ADL Screening (condition at time of admission) Patient's cognitive ability adequate to safely complete daily activities?: Yes Is the patient deaf or have difficulty hearing?: No Does the patient have difficulty seeing, even when wearing glasses/contacts?: No Does the patient have difficulty concentrating, remembering, or making decisions?: No Patient able to express need for assistance with ADLs?: Yes Does the patient have difficulty dressing or bathing?: No Independently performs ADLs?: Yes (appropriate for developmental age) Does the patient have difficulty walking or climbing stairs?: Yes Weakness of Legs: Both Weakness of Arms/Hands: None  Permission Sought/Granted                  Emotional Assessment Appearance:: Appears stated age       Alcohol / Substance Use: Not Applicable Psych Involvement: No (comment)  Admission diagnosis:  SIRS (systemic inflammatory response syndrome) (HCC) [R65.10] Sepsis, due to unspecified organism, unspecified whether acute organ dysfunction present [facility]) [A41.9] Patient Active Problem List   Diagnosis Date Noted  . SIRS (systemic inflammatory response syndrome) ([place])  10/31/2020  . PNA (pneumonia) 10/31/2020  . Abdominal pain 10/31/2020  . GERD (gastroesophageal reflux disease) 10/31/2020  . Chronic diastolic CHF (congestive heart failure) ([place]) 10/31/2020  . Sepsis secondary to UTI ([place]) 07/11/2020  . Chronic atrial fibrillation ([place]) 07/11/2020  . Hypervolemia   . Campylobacter gastrointestinal tract infection 01/03/2019  . Chronic kidney disease (CKD), stage III (moderate) 01/02/2019  . AKI (acute kidney injury) ([place]) 01/02/2019  . Hypovolemia with active loss of fluid 01/02/2019  . Hypokalemia 01/02/2019  . Hypomagnesemia 01/02/2019  . Hypoalbuminemia 01/02/2019  . Bandemia without diagnosis of specific infection 01/02/2019  . Hypotension 01/02/2019  . Fatigue   . Sepsis ([place])   . Bacteria in urine 01/01/2019  . Atrial fibrillation with RVR ([place]) 03/20/2017  . Myogenic ptosis of bilateral eyelids 06/24/2016  . HTN (hypertension)   . Tremor   . Exudative age-related macular degeneration of right eye with active choroidal neovascularization ([place]) 08/29/2014  . Pseudophakia of both eyes 08/29/2014  . OSA (obstructive sleep apnea) 06/29/2012  . Type 2 diabetes mellitus ([place])   . Incidental lung nodule 03/10/2009  . Other diseases of lung, not elsewhere classified 03/10/2009   PCP:  Lucianne Lei, MD Pharmacy:   [facility] [address], [address] [place] [address] Phone: [phone] Fax: [phone]     Social Determinants of Health (SDOH) Interventions    Readmission Risk Interventions No flowsheet data found.

## 2020-11-03 LAB — GLUCOSE, CAPILLARY
Glucose-Capillary: 116 mg/dL — ABNORMAL HIGH (ref 70–99)
Glucose-Capillary: 99 mg/dL (ref 70–99)
Glucose-Capillary: 90 mg/dL (ref 70–99)
Glucose-Capillary: 110 mg/dL — ABNORMAL HIGH (ref 70–99)
Glucose-Capillary: 110 mg/dL — ABNORMAL HIGH (ref 70–99)

## 2020-11-03 LAB — BASIC METABOLIC PANEL
Anion gap: 11 (ref 5–15)
BUN: 19 mg/dL (ref 8–23)
CO2: 25 mmol/L (ref 22–32)
Calcium: 8.8 mg/dL — ABNORMAL LOW (ref 8.9–10.3)
Chloride: 103 mmol/L (ref 98–111)
Creatinine, Ser: 1.15 mg/dL — ABNORMAL HIGH (ref 0.44–1.00)
GFR, Estimated: 47 mL/min — ABNORMAL LOW (ref >60)
Glucose, Bld: 109 mg/dL — ABNORMAL HIGH (ref 70–99)
Potassium: 3.7 mmol/L (ref 3.5–5.1)
Sodium: 139 mmol/L (ref 135–145)

## 2020-11-03 LAB — CBC
HCT: 32.4 % — ABNORMAL LOW (ref 36.0–46.0)
Hemoglobin: 10.2 g/dL — ABNORMAL LOW (ref 12.0–15.0)
MCH: 30.4 pg (ref 26.0–34.0)
MCHC: 31.5 g/dL (ref 30.0–36.0)
MCV: 96.7 fL (ref 80.0–100.0)
Platelets: 155 10*3/uL (ref 150–400)
RBC: 3.35 MIL/uL — ABNORMAL LOW (ref 3.87–5.11)
RDW: 14.4 % (ref 11.5–15.5)
WBC: 7.2 10*3/uL (ref 4.0–10.5)
nRBC: 0 % (ref 0.0–0.2)

## 2020-11-03 MED ORDER — HYDROCODONE-ACETAMINOPHEN 5-325 MG PO TABS
1.0000 | ORAL_TABLET | ORAL | Status: AC | PRN
  Administered 2020-11-03 – 2020-11-04 (×2): 2 via ORAL
  Filled 2020-11-03 (×2): qty 2

## 2020-11-03 MED ORDER — ACETAMINOPHEN 500 MG PO TABS
500.0000 mg | ORAL_TABLET | Freq: Two times a day (BID) | ORAL | Status: AC
  Administered 2020-11-03 – 2020-11-04 (×2): 500 mg via ORAL
  Filled 2020-11-03 (×4): qty 1

## 2020-11-03 MED ORDER — POTASSIUM CHLORIDE CRYS ER 20 MEQ PO TBCR
40.0000 meq | EXTENDED_RELEASE_TABLET | Freq: Once | ORAL | Status: AC
  Administered 2020-11-03: 40 meq via ORAL
  Filled 2020-11-03: qty 2

## 2020-11-03 MED ORDER — DICLOFENAC SODIUM 1 % EX GEL
2.0000 g | Freq: Four times a day (QID) | CUTANEOUS | Status: DC
  Administered 2020-11-03 – 2020-11-05 (×8): 2 g via TOPICAL
  Filled 2020-11-03: qty 100

## 2020-11-03 MED ORDER — FUROSEMIDE 10 MG/ML IJ SOLN
20.0000 mg | Freq: Two times a day (BID) | INTRAMUSCULAR | Status: DC
  Administered 2020-11-03 – 2020-11-05 (×4): 20 mg via INTRAVENOUS
  Filled 2020-11-03 (×4): qty 2

## 2020-11-03 NOTE — Progress Notes (Signed)
PROGRESS NOTE    Anne Roach  YYT:[id number] DOB: Jul 04, 1933 DOA: 10/31/2020 PCP: Anne Lei, MD   Brief Narrative: 86 year old with past medical history significant for chronic A. fib, hypertension, hyperlipidemia, diabetes type 2 who presents complaining of nausea and vomiting.  Patient developed severe epigastric pain multiple episode of nausea and vomiting.  Denies dysuria or fever.  She also reported shortness of breath. Evaluation in the ED patient was tachycardic heart rate 121, tachypnea respiration rate 28, oxygen saturation 90% on 2 L, leukocytosis white count 17 lactic acid 3.1.  Chest x-ray show degree of pulmonary vascular congestion and pleural effusion, ill-defined airspace opacity in the lung bases.  UA with trace of leukocyte.  CT abdomen and pelvis: Showed diverticulosis without diverticulitis.  Moderate to large right side pleural effusion, no other intra-abdominal pathology.   Assessment & Plan:   Principal Problem:   SIRS (systemic inflammatory response syndrome) (HCC) Active Problems:   Type 2 diabetes mellitus (HCC)   Chronic atrial fibrillation (HCC)   PNA (pneumonia)   Abdominal pain   GERD (gastroesophageal reflux disease)   Chronic diastolic CHF (congestive heart failure) (HCC)     Pressure Injury 03/21/17 Stage I -  Intact skin with non-blanchable redness of a localized area usually over a bony prominence. (Active)  03/21/17 0757  Location: Buttocks  Location Orientation: Right;Left  Staging: Stage I -  Intact skin with non-blanchable redness of a localized area usually over a bony prominence.  Wound Description (Comments):   Present on Admission: Yes    1-Severe sepsis: secondary to PNA Patient presented with fever, leukocytosis, lactic acid of 3.1, tachypnea, tachycardia source of infection likely pneumonia -Received initially V vancomycin and cefepime -Blood cultures, urine culture: 2000 enterococcus Avium, 2000 colonies staph  epidermidis.  -WBC normalized. -Now on Omnicef. -leukocytosis resolved.  -evaluated by CCM no need for thoracentesis. They think pleural effusion is related to HF>   2-, B/L pleural effusion more on the Right side pleural ;  Acute diastolic Heart failure exacerbation -Received lasix 20 mg IV on 12/16 and 12/17 -ECHO low normal EF 50-55% , tricuspid regurgitation moderate,.  -Repeated chest x ray showed mildly worse pleural effusion.  -evaluated by CCM 12/17 again. No need for thoracentesis per CCM.  -Continue with lasix.   3-Persistent A fib Continue with Cardizem and eliquis.  Rate controlled.   4-Abdominal pain , nausea vomiting.  Gallbladder unremarkable, CT abdomen pelvis negative for intra abdominal pathology.  Support care.  Resolved.  Might have been related to underline illness.   5-Diabetes type II: Last hemoglobin A1c 6.3: Continue to hold Metformin. Continue with a sliding scale insulin  History of gout: Patient without acute flare: Continue with allopurinol.  Chest pain;  Patient complained of chest pain this afternoon, left side worse with breathing, pressure time.  -EKG showed chronic A fib, no ST elevation.  -Nitroglycerin PRN -troponin negative  Stable exophytic hypodense lesion from the inferior aspect of the uncinate process of the pancreas, likely a benign or indolent process such as a side branch IPMN or a small chronic pseudocyst. MRI without and with contrast may provide better characterization.    Estimated body mass index is 29.17 kg/m as calculated from the following:   Height as of this encounter: 5\' 5"  (1.651 m).   Weight as of this encounter: 79.5 kg.   DVT prophylaxis: Eliquis Code Status: Full code Family Communication: Care discussed with patient and daughter who was at bedside 12/17 Disposition Plan:  Status  is: Inpatient  Remains inpatient appropriate because:IV treatments appropriate due to intensity of illness or inability to  take PO   Dispo: The patient is from: Home              Anticipated d/c is to: Home              Anticipated d/c date is: 2 days              Patient currently is not medically stable to d/c.        Consultants:   Pulmonary   Procedures:   ECHO   Antimicrobials:  Vancomycin and cefepime.   Subjective: She report dyspnea at times.  Chest pain free.  Complaining pain in her leg.   Objective: Vitals:   11/02/20 0951 11/02/20 1600 11/02/20 2000 11/03/20 0900  BP: 110/68 126/68 111/72 130/64  Pulse: 80  72 78  Resp: 16   18  Temp: 98.7 F (37.1 C) 97.9 F (36.6 C) 98.6 F (37 C) 98.5 F (36.9 C)  TempSrc: Oral Oral Oral Oral  SpO2: 92% 92% 95% 94%  Weight:      Height:        Intake/Output Summary (Last 24 hours) at 11/03/2020 0951 Last data filed at 11/03/2020 0601 Gross per 24 hour  Intake 0 ml  Output 1100 ml  Net -1100 ml   Filed Weights   10/31/20 1836 10/31/20 2305 11/01/20 2025  Weight: 77.2 kg 79 kg 79.5 kg    Examination:  General exam: NAD Respiratory system: right side crackles.  Cardiovascular system: S 1  S 2 RRR Gastrointestinal system: BS present, soft,  nt Central nervous system: al;ert and orieneted Extremities: no edema    Data Reviewed: I have personally reviewed following labs and imaging studies  CBC: Recent Labs  Lab 10/31/20 0807 11/01/20 0344 11/02/20 0155 11/03/20 0204  WBC 17.5* 7.2 5.6 7.2  NEUTROABS 14.3*  --   --   --   HGB 12.6 10.0* 10.0* 10.2*  HCT 39.7 31.3* 30.8* 32.4*  MCV 98.3 97.2 96.0 96.7  PLT 186 143* 144* 601   Basic Metabolic Panel: Recent Labs  Lab 10/31/20 0807 11/01/20 0344 11/02/20 0155 11/03/20 0204  NA 138 139 140 139  K 4.1 4.0 4.0 3.7  CL 102 105 106 103  CO2 24 25 24 25   GLUCOSE 131* 114* 102* 109*  BUN 15 17 16 19   CREATININE 1.25* 1.17* 1.16* 1.15*  CALCIUM 9.5 8.6* 8.7* 8.8*  MG  --  1.9  --   --    GFR: Estimated Creatinine Clearance: 37.3 mL/min (A) (by C-G  formula based on SCr of 1.15 mg/dL (H)). Liver Function Tests: Recent Labs  Lab 10/31/20 0807 11/02/20 0155  AST 22 18  ALT 15 12  ALKPHOS 43 31*  BILITOT 0.5 0.8  PROT 7.0 5.5*  ALBUMIN 3.5 2.7*   No results for input(s): LIPASE, AMYLASE in the last 168 hours. No results for input(s): AMMONIA in the last 168 hours. Coagulation Profile: Recent Labs  Lab 10/31/20 0807  INR 1.4*   Cardiac Enzymes: No results for input(s): CKTOTAL, CKMB, CKMBINDEX, TROPONINI in the last 168 hours. BNP (last 3 results) No results for input(s): PROBNP in the last 8760 hours. HbA1C: Recent Labs    10/31/20 1857  HGBA1C 5.9*   CBG: Recent Labs  Lab 11/02/20 1201 11/02/20 1649 11/02/20 2243 11/03/20 0535 11/03/20 0645  GLUCAP 121* 105* 104* 116* 99   Lipid Profile: No  results for input(s): CHOL, HDL, LDLCALC, TRIG, CHOLHDL, LDLDIRECT in the last 72 hours. Thyroid Function Tests: No results for input(s): TSH, T4TOTAL, FREET4, T3FREE, THYROIDAB in the last 72 hours. Anemia Panel: No results for input(s): VITAMINB12, FOLATE, FERRITIN, TIBC, IRON, RETICCTPCT in the last 72 hours. Sepsis Labs: Recent Labs  Lab 10/31/20 0904 10/31/20 1058 10/31/20 1857  PROCALCITON  --   --  <0.10  LATICACIDVEN 3.1* 2.8* 2.2*    Recent Results (from the past 240 hour(s))  Resp Panel by RT-PCR (Flu A&B, Covid) Nasopharyngeal Swab     Status: None   Collection Time: 10/31/20  8:44 AM   Specimen: Nasopharyngeal Swab; Nasopharyngeal(NP) swabs in vial transport medium  Result Value Ref Range Status   SARS Coronavirus 2 by RT PCR NEGATIVE NEGATIVE Final    Comment: (NOTE) SARS-CoV-2 target nucleic acids are NOT DETECTED.  The SARS-CoV-2 RNA is generally detectable in upper respiratory specimens during the acute phase of infection. The lowest concentration of SARS-CoV-2 viral copies this assay can detect is 138 copies/mL. A negative result does not preclude SARS-Cov-2 infection and should not be used  as the sole basis for treatment or other patient management decisions. A negative result may occur with  improper specimen collection/handling, submission of specimen other than nasopharyngeal swab, presence of viral mutation(s) within the areas targeted by this assay, and inadequate number of viral copies(<138 copies/mL). A negative result must be combined with clinical observations, patient history, and epidemiological information. The expected result is Negative.  Fact Sheet for Patients:  EntrepreneurPulse.com.au  Fact Sheet for Healthcare Providers:  IncredibleEmployment.be  This test is no t yet approved or cleared by the [place] FDA and  has been authorized for detection and/or diagnosis of SARS-CoV-2 by FDA under an Emergency Use Authorization (EUA). This EUA will remain  in effect (meaning this test can be used) for the duration of the COVID-19 declaration under Section 564(b)(1) of the Act, 21 U.S.C.section 360bbb-3(b)(1), unless the authorization is terminated  or revoked sooner.       Influenza A by PCR NEGATIVE NEGATIVE Final   Influenza B by PCR NEGATIVE NEGATIVE Final    Comment: (NOTE) The Xpert Xpress SARS-CoV-2/FLU/RSV plus assay is intended as an aid in the diagnosis of influenza from Nasopharyngeal swab specimens and should not be used as a sole basis for treatment. Nasal washings and aspirates are unacceptable for Xpert Xpress SARS-CoV-2/FLU/RSV testing.  Fact Sheet for Patients: EntrepreneurPulse.com.au  Fact Sheet for Healthcare Providers: IncredibleEmployment.be  This test is not yet approved or cleared by the [place] FDA and has been authorized for detection and/or diagnosis of SARS-CoV-2 by FDA under an Emergency Use Authorization (EUA). This EUA will remain in effect (meaning this test can be used) for the duration of the COVID-19 declaration under Section 564(b)(1)  of the Act, 21 U.S.C. section 360bbb-3(b)(1), unless the authorization is terminated or revoked.  Performed at [facility], [place] [address] [place], [place] [address]   Blood Culture (routine x 2)     Status: None (Preliminary result)   Collection Time: 10/31/20  9:04 AM   Specimen: BLOOD  Result Value Ref Range Status   Specimen Description BLOOD LEFT ANTECUBITAL  Final   Special Requests   Final    BOTTLES DRAWN AEROBIC AND ANAEROBIC Blood Culture results may not be optimal due to an inadequate volume of blood received in culture bottles   Culture   Final    NO GROWTH 2 DAYS Performed at  [facility], [place] [address], [place], [place] [address]    Report Status PENDING  Incomplete  Blood Culture (routine x 2)     Status: None (Preliminary result)   Collection Time: 10/31/20  9:09 AM   Specimen: BLOOD LEFT FOREARM  Result Value Ref Range Status   Specimen Description BLOOD LEFT FOREARM  Final   Special Requests   Final    BOTTLES DRAWN AEROBIC AND ANAEROBIC Blood Culture adequate volume   Culture   Final    NO GROWTH 2 DAYS Performed at [facility], [place] [address], [place], [place] [address]    Report Status PENDING  Incomplete  Urine culture     Status: Abnormal (Preliminary result)   Collection Time: 10/31/20 10:58 AM   Specimen: In/Out Cath Urine  Result Value Ref Range Status   Specimen Description IN/OUT CATH URINE  Final   Special Requests NONE  Final   Culture (A)  Final    2,000 COLONIES/mL ENTEROCOCCUS AVIUM 2,000 COLONIES/mL STAPHYLOCOCCUS EPIDERMIDIS SUSCEPTIBILITIES TO FOLLOW Performed at [facility], [place] [address] [place], [place] [address]    Report Status PENDING  Incomplete         Radiology Studies: DG Chest 2 View  Result Date: 11/02/2020 CLINICAL DATA:  Chest pain today EXAM: CHEST - 2 VIEW COMPARISON:  10/31/2020 FINDINGS: Mild progression of small right effusion and right lower lobe atelectasis. No change left  lower lobe atelectasis and small left effusion. Negative for heart failure. Atherosclerotic aortic arch. IMPRESSION: Mild progression of right lower lobe atelectasis and effusion. No change left lower lobe atelectasis and effusion. Electronically Signed   By: Franchot Gallo M.D.   On: 11/02/2020 11:12   ECHOCARDIOGRAM COMPLETE  Result Date: 11/01/2020    ECHOCARDIOGRAM REPORT   Patient Name:   KAEYA SCHIFFER Date of Exam: 11/01/2020 Medical Rec #:  [id number]        Height:       65.0 in Accession #:    [id number]       Weight:       174.2 lb Date of Birth:  10-Feb-1933        BSA:          1.865 m Patient Age:    44 years         BP:           101/50 mmHg Patient Gender: F                HR:           85 bpm. Exam Location:  Inpatient Procedure: 2D Echo, Cardiac Doppler and Color Doppler Indications:    R94.31 Abnormal EKG  History:        Patient has prior history of Echocardiogram examinations, most                 recent 01/03/2019. CHF, Arrythmias:Atrial Fibrillation; Risk                 Factors:Hypertension and Diabetes.  Sonographer:    Jonelle Sidle Dance Referring Phys: [id number] Andy Moye A Rhianne Soman IMPRESSIONS  1. Left ventricular ejection fraction, by estimation, is 50 to 55%. The left ventricle has low normal function. The left ventricle has no regional wall motion abnormalities. There is mild left ventricular hypertrophy. Left ventricular diastolic function  could not be evaluated.  2. Right ventricular systolic function is moderately reduced. The right ventricular size is normal. There is mildly elevated  pulmonary artery systolic pressure.  3. Left atrial size was severely dilated.  4. Right atrial size was mildly dilated.  5. A small pericardial effusion is present.  6. The mitral valve is normal in structure. Moderate mitral valve regurgitation. No evidence of mitral stenosis. Moderate mitral annular calcification.  7. Tricuspid valve regurgitation is moderate.  8. The aortic valve is tricuspid. Aortic valve  regurgitation is mild. Mild aortic valve sclerosis is present, with no evidence of aortic valve stenosis.  9. The inferior vena cava is dilated in size with >50% respiratory variability, suggesting right atrial pressure of 8 mmHg. FINDINGS  Left Ventricle: Left ventricular ejection fraction, by estimation, is 50 to 55%. The left ventricle has low normal function. The left ventricle has no regional wall motion abnormalities. The left ventricular internal cavity size was normal in size. There is mild left ventricular hypertrophy. Left ventricular diastolic function could not be evaluated due to atrial fibrillation. Left ventricular diastolic function could not be evaluated. Right Ventricle: The right ventricular size is normal. Right ventricular systolic function is moderately reduced. There is mildly elevated pulmonary artery systolic pressure. The tricuspid regurgitant velocity is 2.63 m/s, and with an assumed right atrial pressure of 8 mmHg, the estimated right ventricular systolic pressure is 99.2 mmHg. Left Atrium: Left atrial size was severely dilated. Right Atrium: Right atrial size was mildly dilated. Pericardium: A small pericardial effusion is present. Mitral Valve: The mitral valve is normal in structure. Moderate mitral annular calcification. Moderate mitral valve regurgitation. No evidence of mitral valve stenosis. Tricuspid Valve: The tricuspid valve is normal in structure. Tricuspid valve regurgitation is moderate . No evidence of tricuspid stenosis. Aortic Valve: The aortic valve is tricuspid. Aortic valve regurgitation is mild. Mild aortic valve sclerosis is present, with no evidence of aortic valve stenosis. Pulmonic Valve: The pulmonic valve was normal in structure. Pulmonic valve regurgitation is trivial. No evidence of pulmonic stenosis. Aorta: The aortic root is normal in size and structure. Venous: The inferior vena cava is dilated in size with greater than 50% respiratory variability,  suggesting right atrial pressure of 8 mmHg. IAS/Shunts: No atrial level shunt detected by color flow Doppler.  LEFT VENTRICLE PLAX 2D LVIDd:         4.81 cm LVIDs:         4.11 cm LV PW:         1.50 cm LV IVS:        1.14 cm LVOT diam:     2.20 cm LV SV:         46 LV SV Index:   25 LVOT Area:     3.80 cm  RIGHT VENTRICLE          IVC RV Basal diam:  2.72 cm  IVC diam: 2.43 cm TAPSE (M-mode): 1.3 cm LEFT ATRIUM              Index       RIGHT ATRIUM           Index LA diam:        5.30 cm  2.84 cm/m  RA Area:     21.50 cm LA Vol (A2C):   109.0 ml 58.44 ml/m RA Volume:   67.00 ml  35.92 ml/m LA Vol (A4C):   114.0 ml 61.12 ml/m LA Biplane Vol: 118.0 ml 63.26 ml/m  AORTIC VALVE LVOT Vmax:   61.65 cm/s LVOT Vmean:  41.400 cm/s LVOT VTI:    0.121 m  AORTA Ao Root  diam: 3.40 cm Ao Asc diam:  3.70 cm MITRAL VALVE                TRICUSPID VALVE MV Area (PHT): 2.58 cm     TR Peak grad:   27.7 mmHg MV Decel Time: 294 msec     TR Vmax:        263.00 cm/s MV E velocity: 112.50 cm/s                             SHUNTS                             Systemic VTI:  0.12 m                             Systemic Diam: 2.20 cm Kirk Ruths MD Electronically signed by Kirk Ruths MD Signature Date/Time: 11/01/2020/1:42:41 PM    Final         Scheduled Meds: . allopurinol  100 mg Oral Daily  . apixaban  5 mg Oral BID  . cefdinir  300 mg Oral Q12H  . diltiazem  240 mg Oral Daily  . furosemide  20 mg Intravenous BID  . gabapentin  100 mg Oral TID  . insulin aspart  0-9 Units Subcutaneous TID WC  . magnesium oxide  400 mg Oral Daily  . pantoprazole  40 mg Oral Daily  . polyethylene glycol  17 g Oral BID  . potassium chloride  40 mEq Oral Once  . saccharomyces boulardii  250 mg Oral BID  . senna  1 tablet Oral Daily   Continuous Infusions:   LOS: 3 days    Time spent: 35 minutes/     Maeve Debord A Anilah Huck, MD [facility]   If 7PM-7AM, please contact night-coverage [email or web address]  11/03/2020,  9:51 AM

## 2020-11-03 NOTE — Progress Notes (Signed)
Physical Therapy Treatment Patient Details Name: Anne Roach MRN: [id number] DOB: October 06, 1933 Today's Date: 11/03/2020    History of Present Illness 86yo female admitted with acute SIRS/sepsis and possible pneumonia. PMH A-fib, DM, HTN, carpal tunnel release    PT Comments    Pt in bed on arrival and willing to participate with therapy. Ambulation distance limited secondary to bil LE pain. Pt states it starts around the anterior portion of the knee and radiates down the front of the leg to the tops of the feet. Pt unable to fully complete ther ex and stretching, also due to LE pain. Will continue to follow acutely for mobility progression.    Follow Up Recommendations  Home health PT;Supervision for mobility/OOB     Equipment Recommendations  None recommended by PT (well equipped)    Recommendations for Other Services       Precautions / Restrictions Precautions Precautions: Fall;Other (comment) Precaution Comments: watch O2 sats, hx of painful knees and feet    Mobility  Bed Mobility Overal bed mobility: Needs Assistance Bed Mobility: Supine to Sit     Supine to sit: HOB elevated;Supervision     General bed mobility comments: Supervision for safety. No physical A needed  Transfers Overall transfer level: Needs assistance Equipment used: None Transfers: Sit to/from Stand Sit to Stand: Supervision         General transfer comment: S for safety, no physical assist given; mildly unsteady and grossly weak  Ambulation/Gait Ambulation/Gait assistance: Min guard Gait Distance (Feet): 225 Feet Assistive device: None Gait Pattern/deviations: Step-through pattern;Trunk flexed;Decreased step length - right;Decreased step length - left;Decreased stride length Gait velocity: decreased   General Gait Details: slow and steady with no device, gait distance limited by knee and foot pain;   Stairs             Wheelchair Mobility    Modified Rankin (Stroke  Patients Only)       Balance Overall balance assessment: Mild deficits observed, not formally tested (able to bend over and pick items out of closet. Sat EOB and adjusted socks independently)                                          Cognition Arousal/Alertness: Awake/alert Behavior During Therapy: WFL for tasks assessed/performed;Flat affect Overall Cognitive Status: Within Functional Limits for tasks assessed                                 General Comments: very polite and cooperative      Exercises General Exercises - Lower Extremity Ankle Circles/Pumps: AROM;Both;5 reps;10 reps (10 on R 5 on L - pt unable to complete due to pain) Heel Slides: AROM;Both;5 reps;10 reps;Seated (10 on R 5 on L - pt unable to complete due to pain) Other Exercises Other Exercises: Attempted both hamstring and calf stretch. Pt unable to perform eiher secondary to LE pain.    General Comments        Pertinent Vitals/Pain Pain Assessment: Faces Faces Pain Scale: Hurts even more Pain Location: BLE - increased with weight bearing Pain Descriptors / Indicators: Discomfort;Grimacing ("feels like I am walking on stone") Pain Intervention(s): Monitored during session;Limited activity within patient's tolerance;Repositioned    Home Living  Prior Function            PT Goals (current goals can now be found in the care plan section) Acute Rehab PT Goals Patient Stated Goal: feel better, go home PT Goal Formulation: With patient/family Time For Goal Achievement: 11/15/20 Potential to Achieve Goals: Fair Progress towards PT goals: Progressing toward goals    Frequency    Min 3X/week      PT Plan Current plan remains appropriate    Co-evaluation              AM-PAC PT "6 Clicks" Mobility   Outcome Measure  Help needed turning from your back to your side while in a flat bed without using bedrails?: None Help needed  moving from lying on your back to sitting on the side of a flat bed without using bedrails?: A Little Help needed moving to and from a bed to a chair (including a wheelchair)?: A Little Help needed standing up from a chair using your arms (e.g., wheelchair or bedside chair)?: A Little Help needed to walk in hospital room?: A Little Help needed climbing 3-5 steps with a railing? : A Little 6 Click Score: 19    End of Session Equipment Utilized During Treatment: Gait belt Activity Tolerance: Patient tolerated treatment well;Patient limited by pain (chronic knee and foot pain) Patient left: in chair;with call bell/phone within reach Nurse Communication: Mobility status PT Visit Diagnosis: Unsteadiness on feet (R26.81);Difficulty in walking, not elsewhere classified (R26.2);Muscle weakness (generalized) (M62.81)     Time: 9090-3014 PT Time Calculation (min) (ACUTE ONLY): 20 min  Charges:  $Gait Training: 8-22 mins                     Benjiman Core, [place] Pager [phone] Acute Rehab  Allena Katz 11/03/2020, 2:45 PM

## 2020-11-04 LAB — GLUCOSE, CAPILLARY
Glucose-Capillary: 100 mg/dL — ABNORMAL HIGH (ref 70–99)
Glucose-Capillary: 98 mg/dL (ref 70–99)
Glucose-Capillary: 122 mg/dL — ABNORMAL HIGH (ref 70–99)
Glucose-Capillary: 118 mg/dL — ABNORMAL HIGH (ref 70–99)

## 2020-11-04 LAB — BASIC METABOLIC PANEL
Anion gap: 13 (ref 5–15)
BUN: 19 mg/dL (ref 8–23)
CO2: 24 mmol/L (ref 22–32)
Calcium: 9.1 mg/dL (ref 8.9–10.3)
Chloride: 104 mmol/L (ref 98–111)
Creatinine, Ser: 1.17 mg/dL — ABNORMAL HIGH (ref 0.44–1.00)
GFR, Estimated: 46 mL/min — ABNORMAL LOW (ref >60)
Glucose, Bld: 146 mg/dL — ABNORMAL HIGH (ref 70–99)
Potassium: 3.4 mmol/L — ABNORMAL LOW (ref 3.5–5.1)
Sodium: 141 mmol/L (ref 135–145)

## 2020-11-04 LAB — URINE CULTURE: Culture: 2000 — AB

## 2020-11-04 LAB — CBC
HCT: 34.7 % — ABNORMAL LOW (ref 36.0–46.0)
Hemoglobin: 10.8 g/dL — ABNORMAL LOW (ref 12.0–15.0)
MCH: 29.8 pg (ref 26.0–34.0)
MCHC: 31.1 g/dL (ref 30.0–36.0)
MCV: 95.9 fL (ref 80.0–100.0)
Platelets: 173 10*3/uL (ref 150–400)
RBC: 3.62 MIL/uL — ABNORMAL LOW (ref 3.87–5.11)
RDW: 14.3 % (ref 11.5–15.5)
WBC: 6.3 10*3/uL (ref 4.0–10.5)
nRBC: 0 % (ref 0.0–0.2)

## 2020-11-04 LAB — MRSA PCR SCREENING: MRSA by PCR: NEGATIVE

## 2020-11-04 MED ORDER — CEFDINIR 300 MG PO CAPS
300.0000 mg | ORAL_CAPSULE | Freq: Two times a day (BID) | ORAL | Status: DC
  Administered 2020-11-04 – 2020-11-05 (×3): 300 mg via ORAL
  Filled 2020-11-04 (×4): qty 1

## 2020-11-04 MED ORDER — TRAMADOL HCL 50 MG PO TABS
50.0000 mg | ORAL_TABLET | Freq: Two times a day (BID) | ORAL | Status: DC | PRN
  Administered 2020-11-04: 50 mg via ORAL
  Filled 2020-11-04: qty 1

## 2020-11-04 MED ORDER — POTASSIUM CHLORIDE CRYS ER 20 MEQ PO TBCR
40.0000 meq | EXTENDED_RELEASE_TABLET | Freq: Two times a day (BID) | ORAL | Status: AC
  Administered 2020-11-04 – 2020-11-05 (×3): 40 meq via ORAL
  Filled 2020-11-04 (×3): qty 2

## 2020-11-04 NOTE — Progress Notes (Signed)
Occupational Therapy Treatment Patient Details Name: Anne Roach MRN: [id number] DOB: 22-Nov-1932 Today's Date: 11/04/2020    History of present illness 86yo female admitted with acute SIRS/sepsis and possible pneumonia. PMH A-fib, DM, HTN, carpal tunnel release   OT comments  Treatment focused on improving functional mobility, ADLs, and activity tolerance in order to return home at discharge. Patient supervision for all ambulation and ADLs - including toileting. No physical assistance needed just supervision to monitor vitals, symptoms and pain. Patient ambulated 200 feet in hall without DME and reports pain improved from yesterday.     Follow Up Recommendations  No OT follow up;Supervision - Intermittent    Equipment Recommendations  None recommended by OT    Recommendations for Other Services      Precautions / Restrictions Precautions Precautions: Fall Precaution Comments: watch O2 sats, hx of painful knees and feet Restrictions Weight Bearing Restrictions: No       Mobility Bed Mobility               General bed mobility comments: up in chair  Transfers Overall transfer level: Needs assistance Equipment used: None Transfers: Sit to/from Stand Sit to Stand: Supervision         General transfer comment: Supervision for in room ambulation and 200 feet in hallway without DME. In room placing hand on furniture if needed.    Balance Overall balance assessment: Mild deficits observed, not formally tested                                         ADL either performed or assessed with clinical judgement   ADL Overall ADL's : Needs assistance/impaired     Grooming: Brushing hair;Wash/dry hands;Standing;Supervision/safety Grooming Details (indicate cue type and reason): Initially went to sink and stood and brushed hair. After toileting came back out to sink and washed hand. Therapist provivded supervision for safety                  Toilet Transfer: Supervision/safety;Regular Toilet;Grab bars Toilet Transfer Details (indicate cue type and reason): Ambulated to bathroom without device. Performed toilet transfer with supervision. Toileting- Clothing Manipulation and Hygiene: Supervision/safety;Sit to/from stand Toileting - Clothing Manipulation Details (indicate cue type and reason): Performed toileting with supervision/             Vision Patient Visual Report: No change from baseline     Perception     Praxis      Cognition Arousal/Alertness: Awake/alert Behavior During Therapy: WFL for tasks assessed/performed Overall Cognitive Status: Within Functional Limits for tasks assessed                                          Exercises     Shoulder Instructions       General Comments      Pertinent Vitals/ Pain       Pain Assessment: Faces Faces Pain Scale: Hurts little more Pain Location: BLE - reports improved from yesterday. Pain Descriptors / Indicators: Discomfort;Grimacing Pain Intervention(s): Monitored during session  Home Living                                          Prior  Functioning/Environment              Frequency  Min 2X/week        Progress Toward Goals  OT Goals(current goals can now be found in the care plan section)  Progress towards OT goals: Progressing toward goals  Acute Rehab OT Goals Patient Stated Goal: feel better, go home OT Goal Formulation: With patient Time For Goal Achievement: 11/16/20 Potential to Achieve Goals: Good  Plan Discharge plan remains appropriate    Co-evaluation                 AM-PAC OT "6 Clicks" Daily Activity     Outcome Measure   Help from another person eating meals?: None Help from another person taking care of personal grooming?: A Little Help from another person toileting, which includes using toliet, bedpan, or urinal?: A Little Help from another person bathing (including  washing, rinsing, drying)?: A Little Help from another person to put on and taking off regular upper body clothing?: A Little Help from another person to put on and taking off regular lower body clothing?: A Little 6 Click Score: 19    End of Session    OT Visit Diagnosis: Other abnormalities of gait and mobility (R26.89);Unsteadiness on feet (R26.81);Muscle weakness (generalized) (M62.81)   Activity Tolerance Patient tolerated treatment well   Patient Left in chair;with call bell/phone within reach   Nurse Communication Mobility status        Time: 7897-8478 OT Time Calculation (min): 23 min  Charges: OT General Charges $OT Visit: 1 Visit OT Treatments $Self Care/Home Management : 8-22 mins $Therapeutic Activity: 8-22 mins  Rodrecus Belsky, OTR/L [name]  Office [phone] Pager: [place] 11/04/2020, 11:59 AM

## 2020-11-04 NOTE — Progress Notes (Signed)
PROGRESS NOTE    Anne Roach  WGY:[id number] DOB: [birth year] DOA: 10/31/2020 PCP: Lucianne Lei, MD   Brief Narrative: 86 year old with past medical history significant for chronic A. fib, hypertension, hyperlipidemia, diabetes type 2 who presents complaining of nausea and vomiting.  Patient developed severe epigastric pain multiple episode of nausea and vomiting.  Denies dysuria or fever.  She also reported shortness of breath. Evaluation in the ED patient was tachycardic heart rate 121, tachypnea respiration rate 28, oxygen saturation 90% on 2 L, leukocytosis white count 17 lactic acid 3.1.  Chest x-ray show degree of pulmonary vascular congestion and pleural effusion, ill-defined airspace opacity in the lung bases.  UA with trace of leukocyte.  CT abdomen and pelvis: Showed diverticulosis without diverticulitis.  Moderate to large right side pleural effusion, no other intra-abdominal pathology.   Assessment & Plan:   Principal Problem:   SIRS (systemic inflammatory response syndrome) (HCC) Active Problems:   Type 2 diabetes mellitus (HCC)   Chronic atrial fibrillation (HCC)   PNA (pneumonia)   Abdominal pain   GERD (gastroesophageal reflux disease)   Chronic diastolic CHF (congestive heart failure) (HCC)     Pressure Injury 03/21/17 Stage I -  Intact skin with non-blanchable redness of a localized area usually over a bony prominence. (Active)  03/21/17 0757  Location: Buttocks  Location Orientation: Right;Left  Staging: Stage I -  Intact skin with non-blanchable redness of a localized area usually over a bony prominence.  Wound Description (Comments):   Present on Admission: Yes    1-Severe sepsis: secondary to PNA Patient presented with fever, leukocytosis, lactic acid of 3.1, tachypnea, tachycardia source of infection likely pneumonia -Received initially V vancomycin and cefepime -Blood cultures, urine culture: 2000 enterococcus Avium, 2000 colonies staph  epidermidis.  -WBC normalized. -Now on Omnicef. Low grade fever continue with antibiotics.  -leukocytosis resolved.  -evaluated by CCM no need for thoracentesis. They think pleural effusion is related to HF>  -plan to repeat chest x ray tomorrow.   2-, B/L pleural effusion more on the Right side pleural ;  Acute diastolic Heart failure exacerbation -Received lasix 20 mg IV on 12/16 and 12/17 -ECHO low normal EF 50-55% , tricuspid regurgitation moderate,.  -Repeated chest x ray showed mildly worse pleural effusion.  -evaluated by CCM 12/17 again. No need for thoracentesis per CCM.  -Continue with lasix.  -repeat chest x ray tomorrow.  -negative 4 L.   3-Persistent A fib Continue with Cardizem and eliquis.  Rate controlled.   4-Abdominal pain , nausea vomiting.  Gallbladder unremarkable, CT abdomen pelvis negative for intra abdominal pathology.  Support care.  Resolved.  Might have been related to underline illness.   5-Diabetes type II: Last hemoglobin A1c 6.3: Continue to hold Metformin. Continue with a sliding scale insulin  History of gout: Patient without acute flare: Continue with allopurinol.  Chest pain;  Patient complained of chest pain this afternoon, left side worse with breathing, pressure time.  -EKG showed chronic A fib, no ST elevation.  -Nitroglycerin PRN -troponin negative  Stable exophytic hypodense lesion from the inferior aspect of the uncinate process of the pancreas, likely a benign or indolent process such as a side branch IPMN or a small chronic pseudocyst. MRI without and with contrast may provide better characterization.  -needs MRI  Estimated body mass index is 29.17 kg/m as calculated from the following:   Height as of this encounter: 5\' 5"  (1.651 m).   Weight as of this encounter: 79.5 kg.  DVT prophylaxis: Eliquis Code Status: Full code Family Communication: Care discussed with patient and daughter who was at bedside 12/17 Disposition  Plan:  Status is: Inpatient  Remains inpatient appropriate because:IV treatments appropriate due to intensity of illness or inability to take PO   Dispo: The patient is from: Home              Anticipated d/c is to: Home              Anticipated d/c date is: 2 days              Patient currently is not medically stable to d/c.        Consultants:   Pulmonary   Procedures:   ECHO   Antimicrobials:  Vancomycin and cefepime.   Subjective: breathing better, couldn't sleep last night.    Objective: Vitals:   11/03/20 1643 11/03/20 2129 11/04/20 0514 11/04/20 0950  BP: 121/64 140/77 130/87 (!) 141/87  Pulse: 67 80 79 82  Resp: 16 18 17 17   Temp: 98.4 F (36.9 C) 98.8 F (37.1 C) 97.9 F (36.6 C) 100.3 F (37.9 C)  TempSrc: Oral Oral Oral Oral  SpO2: 94% 95% 94% 95%  Weight:  79.5 kg    Height:        Intake/Output Summary (Last 24 hours) at 11/04/2020 1319 Last data filed at 11/04/2020 0200 Gross per 24 hour  Intake 300 ml  Output --  Net 300 ml   Filed Weights   10/31/20 2305 11/01/20 2025 11/03/20 2129  Weight: 79 kg 79.5 kg 79.5 kg    Examination:  General exam: NAD Respiratory system: Right side crackles.  Cardiovascular system: S 1, S 2 RRR Gastrointestinal system: BS present, soft, nt Central nervous system: alert Extremities: no edema    Data Reviewed: I have personally reviewed following labs and imaging studies  CBC: Recent Labs  Lab 10/31/20 0807 11/01/20 0344 11/02/20 0155 11/03/20 0204 11/04/20 0910  WBC 17.5* 7.2 5.6 7.2 6.3  NEUTROABS 14.3*  --   --   --   --   HGB 12.6 10.0* 10.0* 10.2* 10.8*  HCT 39.7 31.3* 30.8* 32.4* 34.7*  MCV 98.3 97.2 96.0 96.7 95.9  PLT 186 143* 144* 155 572   Basic Metabolic Panel: Recent Labs  Lab 10/31/20 0807 11/01/20 0344 11/02/20 0155 11/03/20 0204 11/04/20 0910  NA 138 139 140 139 141  K 4.1 4.0 4.0 3.7 3.4*  CL 102 105 106 103 104  CO2 24 25 24 25 24   GLUCOSE 131* 114* 102*  109* 146*  BUN 15 17 16 19 19   CREATININE 1.25* 1.17* 1.16* 1.15* 1.17*  CALCIUM 9.5 8.6* 8.7* 8.8* 9.1  MG  --  1.9  --   --   --    GFR: Estimated Creatinine Clearance: 36.6 mL/min (A) (by C-G formula based on SCr of 1.17 mg/dL (H)). Liver Function Tests: Recent Labs  Lab 10/31/20 0807 11/02/20 0155  AST 22 18  ALT 15 12  ALKPHOS 43 31*  BILITOT 0.5 0.8  PROT 7.0 5.5*  ALBUMIN 3.5 2.7*   No results for input(s): LIPASE, AMYLASE in the last 168 hours. No results for input(s): AMMONIA in the last 168 hours. Coagulation Profile: Recent Labs  Lab 10/31/20 0807  INR 1.4*   Cardiac Enzymes: No results for input(s): CKTOTAL, CKMB, CKMBINDEX, TROPONINI in the last 168 hours. BNP (last 3 results) No results for input(s): PROBNP in the last 8760 hours. HbA1C: No results for  input(s): HGBA1C in the last 72 hours. CBG: Recent Labs  Lab 11/03/20 1126 11/03/20 1643 11/03/20 2131 11/04/20 0653 11/04/20 1136  GLUCAP 90 110* 110* 100* 98   Lipid Profile: No results for input(s): CHOL, HDL, LDLCALC, TRIG, CHOLHDL, LDLDIRECT in the last 72 hours. Thyroid Function Tests: No results for input(s): TSH, T4TOTAL, FREET4, T3FREE, THYROIDAB in the last 72 hours. Anemia Panel: No results for input(s): VITAMINB12, FOLATE, FERRITIN, TIBC, IRON, RETICCTPCT in the last 72 hours. Sepsis Labs: Recent Labs  Lab 10/31/20 0904 10/31/20 1058 10/31/20 1857  PROCALCITON  --   --  <0.10  LATICACIDVEN 3.1* 2.8* 2.2*    Recent Results (from the past 240 hour(s))  Resp Panel by RT-PCR (Flu A&B, Covid) Nasopharyngeal Swab     Status: None   Collection Time: 10/31/20  8:44 AM   Specimen: Nasopharyngeal Swab; Nasopharyngeal(NP) swabs in vial transport medium  Result Value Ref Range Status   SARS Coronavirus 2 by RT PCR NEGATIVE NEGATIVE Final    Comment: (NOTE) SARS-CoV-2 target nucleic acids are NOT DETECTED.  The SARS-CoV-2 RNA is generally detectable in upper respiratory specimens during  the acute phase of infection. The lowest concentration of SARS-CoV-2 viral copies this assay can detect is 138 copies/mL. A negative result does not preclude SARS-Cov-2 infection and should not be used as the sole basis for treatment or other patient management decisions. A negative result may occur with  improper specimen collection/handling, submission of specimen other than nasopharyngeal swab, presence of viral mutation(s) within the areas targeted by this assay, and inadequate number of viral copies(<138 copies/mL). A negative result must be combined with clinical observations, patient history, and epidemiological information. The expected result is Negative.  Fact Sheet for Patients:  EntrepreneurPulse.com.au  Fact Sheet for Healthcare Providers:  IncredibleEmployment.be  This test is no t yet approved or cleared by the [place] FDA and  has been authorized for detection and/or diagnosis of SARS-CoV-2 by FDA under an Emergency Use Authorization (EUA). This EUA will remain  in effect (meaning this test can be used) for the duration of the COVID-19 declaration under Section 564(b)(1) of the Act, 21 U.S.C.section 360bbb-3(b)(1), unless the authorization is terminated  or revoked sooner.       Influenza A by PCR NEGATIVE NEGATIVE Final   Influenza B by PCR NEGATIVE NEGATIVE Final    Comment: (NOTE) The Xpert Xpress SARS-CoV-2/FLU/RSV plus assay is intended as an aid in the diagnosis of influenza from Nasopharyngeal swab specimens and should not be used as a sole basis for treatment. Nasal washings and aspirates are unacceptable for Xpert Xpress SARS-CoV-2/FLU/RSV testing.  Fact Sheet for Patients: EntrepreneurPulse.com.au  Fact Sheet for Healthcare Providers: IncredibleEmployment.be  This test is not yet approved or cleared by the [place] FDA and has been authorized for detection and/or  diagnosis of SARS-CoV-2 by FDA under an Emergency Use Authorization (EUA). This EUA will remain in effect (meaning this test can be used) for the duration of the COVID-19 declaration under Section 564(b)(1) of the Act, 21 U.S.C. section 360bbb-3(b)(1), unless the authorization is terminated or revoked.  Performed at [facility], [place] [address], [place], [place] [address]   Blood Culture (routine x 2)     Status: None (Preliminary result)   Collection Time: 10/31/20  9:04 AM   Specimen: BLOOD  Result Value Ref Range Status   Specimen Description BLOOD LEFT ANTECUBITAL  Final   Special Requests   Final    BOTTLES DRAWN AEROBIC AND  ANAEROBIC Blood Culture results may not be optimal due to an inadequate volume of blood received in culture bottles   Culture   Final    NO GROWTH 4 DAYS Performed at [facility] [address], [place], [place] [address]    Report Status PENDING  Incomplete  Blood Culture (routine x 2)     Status: None (Preliminary result)   Collection Time: 10/31/20  9:09 AM   Specimen: BLOOD LEFT FOREARM  Result Value Ref Range Status   Specimen Description BLOOD LEFT FOREARM  Final   Special Requests   Final    BOTTLES DRAWN AEROBIC AND ANAEROBIC Blood Culture adequate volume   Culture   Final    NO GROWTH 4 DAYS Performed at [facility], [place] [address], [place], [place] [address]    Report Status PENDING  Incomplete  Urine culture     Status: Abnormal   Collection Time: 10/31/20 10:58 AM   Specimen: In/Out Cath Urine  Result Value Ref Range Status   Specimen Description IN/OUT CATH URINE  Final   Special Requests   Final    NONE Performed at [facility], [place] [address], [place], [place] [address]    Culture (A)  Final    2,000 COLONIES/mL ENTEROCOCCUS AVIUM 2,000 COLONIES/mL STAPHYLOCOCCUS EPIDERMIDIS    Report Status 11/04/2020 FINAL  Final   Organism ID, Bacteria ENTEROCOCCUS AVIUM (A)  Final   Organism ID, Bacteria  STAPHYLOCOCCUS EPIDERMIDIS (A)  Final      Susceptibility   Enterococcus avium - MIC*    AMPICILLIN 8 SENSITIVE Sensitive     NITROFURANTOIN 32 SENSITIVE Sensitive     VANCOMYCIN <=0.5 SENSITIVE Sensitive     * 2,000 COLONIES/mL ENTEROCOCCUS AVIUM   Staphylococcus epidermidis - MIC*    CIPROFLOXACIN <=0.5 SENSITIVE Sensitive     GENTAMICIN <=0.5 SENSITIVE Sensitive     NITROFURANTOIN <=16 SENSITIVE Sensitive     OXACILLIN >=4 RESISTANT Resistant     TETRACYCLINE >=16 RESISTANT Resistant     VANCOMYCIN 1 SENSITIVE Sensitive     TRIMETH/SULFA <=10 SENSITIVE Sensitive     CLINDAMYCIN >=8 RESISTANT Resistant     RIFAMPIN 4 RESISTANT Resistant     Inducible Clindamycin NEGATIVE Sensitive     * 2,000 COLONIES/mL STAPHYLOCOCCUS EPIDERMIDIS  MRSA PCR Screening     Status: None   Collection Time: 11/04/20 10:26 AM   Specimen: Nasal Mucosa; Nasopharyngeal  Result Value Ref Range Status   MRSA by PCR NEGATIVE NEGATIVE Final    Comment:        The GeneXpert MRSA Assay (FDA approved for NASAL specimens only), is one component of a comprehensive MRSA colonization surveillance program. It is not intended to diagnose MRSA infection nor to guide or monitor treatment for MRSA infections. Performed at [facility], [place] [address], [place], [place] [address]          Radiology Studies: No results found.      Scheduled Meds: . acetaminophen  500 mg Oral BID  . allopurinol  100 mg Oral Daily  . apixaban  5 mg Oral BID  . cefdinir  300 mg Oral Q12H  . diclofenac Sodium  2 g Topical QID  . diltiazem  240 mg Oral Daily  . furosemide  20 mg Intravenous BID  . gabapentin  100 mg Oral TID  . insulin aspart  0-9 Units Subcutaneous TID WC  . magnesium oxide  400 mg Oral Daily  .  pantoprazole  40 mg Oral Daily  . saccharomyces boulardii  250 mg Oral BID  . senna  1 tablet Oral Daily   Continuous Infusions:   LOS: 4 days    Time spent: 35 minutes/     Sarha Bartelt A Dalynn Jhaveri,  MD [facility]   If 7PM-7AM, please contact night-coverage [email or web address]  11/04/2020, 1:19 PM

## 2020-11-05 ENCOUNTER — Other Ambulatory Visit: Payer: Self-pay

## 2020-11-05 ENCOUNTER — Inpatient Hospital Stay (HOSPITAL_COMMUNITY): Payer: Medicare HMO

## 2020-11-05 LAB — CBC
HCT: 35.6 % — ABNORMAL LOW (ref 36.0–46.0)
Hemoglobin: 11.3 g/dL — ABNORMAL LOW (ref 12.0–15.0)
MCH: 30.3 pg (ref 26.0–34.0)
MCHC: 31.7 g/dL (ref 30.0–36.0)
MCV: 95.4 fL (ref 80.0–100.0)
Platelets: 177 10*3/uL (ref 150–400)
RBC: 3.73 MIL/uL — ABNORMAL LOW (ref 3.87–5.11)
RDW: 14.2 % (ref 11.5–15.5)
WBC: 6.8 10*3/uL (ref 4.0–10.5)
nRBC: 0 % (ref 0.0–0.2)

## 2020-11-05 LAB — GLUCOSE, CAPILLARY
Glucose-Capillary: 99 mg/dL (ref 70–99)
Glucose-Capillary: 132 mg/dL — ABNORMAL HIGH (ref 70–99)

## 2020-11-05 LAB — BASIC METABOLIC PANEL
Anion gap: 11 (ref 5–15)
BUN: 15 mg/dL (ref 8–23)
CO2: 26 mmol/L (ref 22–32)
Calcium: 9.3 mg/dL (ref 8.9–10.3)
Chloride: 103 mmol/L (ref 98–111)
Creatinine, Ser: 1.12 mg/dL — ABNORMAL HIGH (ref 0.44–1.00)
GFR, Estimated: 48 mL/min — ABNORMAL LOW (ref >60)
Glucose, Bld: 105 mg/dL — ABNORMAL HIGH (ref 70–99)
Potassium: 3.7 mmol/L (ref 3.5–5.1)
Sodium: 140 mmol/L (ref 135–145)

## 2020-11-05 LAB — CULTURE, BLOOD (ROUTINE X 2)
Culture: NO GROWTH
Culture: NO GROWTH
Special Requests: ADEQUATE

## 2020-11-05 MED ORDER — FUROSEMIDE 40 MG PO TABS: 40.0000 mg | ORAL_TABLET | Freq: Every day | ORAL | 11 refills | Status: DC

## 2020-11-05 MED ORDER — CEFDINIR 300 MG PO CAPS: 300.0000 mg | ORAL_CAPSULE | Freq: Two times a day (BID) | ORAL | 0 refills | Status: DC

## 2020-11-05 MED ORDER — DICLOFENAC SODIUM 1 % EX GEL: 2.0000 g | Freq: Four times a day (QID) | CUTANEOUS | 0 refills | Status: DC

## 2020-11-05 MED ORDER — TRAMADOL HCL 50 MG PO TABS: 50.0000 mg | ORAL_TABLET | Freq: Two times a day (BID) | ORAL | 0 refills | Status: AC | PRN

## 2020-11-05 MED ORDER — POTASSIUM CHLORIDE CRYS ER 20 MEQ PO TBCR: 40.0000 meq | EXTENDED_RELEASE_TABLET | Freq: Every day | ORAL | 0 refills | Status: DC

## 2020-11-05 NOTE — Discharge Summary (Addendum)
Physician Discharge Summary  ONETHA GAFFEY WYO:[id number] DOB: 1933/05/23 DOA: 10/31/2020  PCP: Lucianne Lei, MD  Admit date: 10/31/2020 Discharge date: 11/05/2020  Admitted From: Home  Disposition:  Home   Recommendations for Outpatient Follow-up:  1. Follow up with PCP in 1-2 weeks 2. Please obtain BMP/CBC in one week 3. Follow up with cardiology for Heart failure, tricuspid valve regurgitation, moderate mitral valve regurgitation.  4. Needs pancreatic MRI to follow pancreatic lesion.   Home Health: yes.   Discharge Condition: Stable.  CODE STATUS: Full code Diet recommendation: Heart Healthy    Brief/Interim Summary: 86 year old with past medical history significant for chronic A. fib, hypertension, hyperlipidemia, diabetes type 2 who presents complaining of nausea and vomiting.  Patient developed severe epigastric pain multiple episode of nausea and vomiting.  Denies dysuria or fever.  She also reported shortness of breath. Evaluation in the ED patient was tachycardic heart rate 121, tachypnea respiration rate 28, oxygen saturation 90% on 2 L, leukocytosis white count 17 lactic acid 3.1.  Chest x-ray show degree of pulmonary vascular congestion and pleural effusion, ill-defined airspace opacity in the lung bases.  UA with trace of leukocyte.  CT abdomen and pelvis: Showed diverticulosis without diverticulitis.  Moderate to large right side pleural effusion, no other intra-abdominal pathology.    1-Severe sepsis: secondary to PNA Patient presented with fever, leukocytosis, lactic acid of 3.1, tachypnea, tachycardia source of infection likely pneumonia -Received initially V vancomycin and cefepime -Blood cultures, urine culture: 2000 enterococcus Avium, 2000 colonies staph epidermidis.  -WBC normalized. -leukocytosis resolved.  -evaluated by CCM no need for thoracentesis. They think pleural effusion is related to HF>  -chest x ray stable.  -she had low grade fever,  antibiotics extended for 3 more days.   2-, B/L pleural effusion more on the Right side pleural ;  Acute diastolic Heart failure exacerbation -Received lasix 20 mg IV on 12/16 and 12/17 -ECHO low normal EF 50-55% , tricuspid regurgitation moderate,.  -Repeated chest x ray showed mildly worse pleural effusion.  -evaluated by CCM 12/17 again. No need for thoracentesis per CCM.  -Continue with lasix.  -repeat chest x ray tomorrow.  -negative 4 L.   3-Persistent A fib Continue with Cardizem and eliquis.  Rate controlled.   4-Abdominal pain , nausea vomiting.  Gallbladder unremarkable, CT abdomen pelvis negative for intra abdominal pathology.  Support care.  Resolved.  Might have been related to underline illness.   5-Diabetes type II: Last hemoglobin A1c 6.3: Continue to hold Metformin. Continue with a sliding scale insulin  History of gout: Patient without acute flare: Continue with allopurinol.  Chest pain;  Patient complained of chest pain this afternoon, left side worse with breathing, pressure time.  -EKG showed chronic A fib, no ST elevation.  -Nitroglycerin PRN -troponin negative  Stable exophytic hypodense lesion from the inferior aspect of the uncinate process of the pancreas, likely a benign or indolent process such as a side branch IPMN or a small chronic pseudocyst. MRI without and with contrast may provide better characterization.  -needs MRI  CKD stage 3 a; stable  Discharge Diagnoses:  Principal Problem:   SIRS (systemic inflammatory response syndrome) (HCC) Active Problems:   Type 2 diabetes mellitus (HCC)   Chronic atrial fibrillation (HCC)   PNA (pneumonia)   Abdominal pain   GERD (gastroesophageal reflux disease)   Chronic diastolic CHF (congestive heart failure) [facility])    Discharge Instructions  Discharge Instructions    Diet - low sodium heart healthy  Complete by: As directed    Diet - low sodium heart healthy   Complete by: As  directed    Increase activity slowly   Complete by: As directed    Increase activity slowly   Complete by: As directed      Allergies as of 11/05/2020   No Known Allergies     Medication List    TAKE these medications   Accu-Chek Aviva Plus test strip Generic drug: glucose blood   acetaminophen 500 MG tablet Commonly known as: TYLENOL Take 1,000 mg by mouth every 6 (six) hours as needed for mild pain or headache.   allopurinol 100 MG tablet Commonly known as: ZYLOPRIM Take 1 tablet (100 mg total) by mouth daily.   cefdinir 300 MG capsule Commonly known as: OMNICEF Take 1 capsule (300 mg total) by mouth every 12 (twelve) hours for 3 days.   cholecalciferol 25 MCG (1000 UNIT) tablet Commonly known as: VITAMIN D3 Take 1,000 Units by mouth daily.   colchicine 0.6 MG tablet Take 1 tablet (0.6 mg total) by mouth daily. What changed:   when to take this  reasons to take this   diclofenac Sodium 1 % Gel Commonly known as: VOLTAREN Apply 2 g topically 4 (four) times daily.   diltiazem 240 MG 24 hr capsule Commonly known as: CARDIZEM CD TAKE 1 CAPSULE(240 MG) BY MOUTH DAILY What changed: See the new instructions.   Eliquis 5 MG Tabs tablet Generic drug: apixaban TAKE 1 TABLET(5 MG) BY MOUTH TWICE DAILY What changed: See the new instructions.   furosemide 40 MG tablet Commonly known as: Lasix Take 1 tablet (40 mg total) by mouth daily.   gabapentin 100 MG capsule Commonly known as: NEURONTIN Take 1 capsule (100 mg total) by mouth 3 (three) times daily.   ICAPS AREDS 2 PO Take 2 capsules by mouth daily.   magnesium oxide 400 (241.3 Mg) MG tablet Commonly known as: MAG-OX Take 1 tablet (400 mg total) by mouth daily.   meclizine 25 MG tablet Commonly known as: ANTIVERT Take 1 tablet (25 mg total) by mouth 3 (three) times daily as needed for dizziness.   metFORMIN 750 MG 24 hr tablet Commonly known as: GLUCOPHAGE-XR Take 750 mg by mouth daily.    pantoprazole 40 MG tablet Commonly known as: PROTONIX Take 40 mg by mouth daily as needed (heartburn).   potassium chloride SA 20 MEQ tablet Commonly known as: KLOR-CON Take 2 tablets (40 mEq total) by mouth daily.   traMADol 50 MG tablet Commonly known as: ULTRAM Take 1 tablet (50 mg total) by mouth every 12 (twelve) hours as needed for up to 5 days for severe pain.       Follow-up Information    Candee Furbish, MD Follow up.   Specialty: Pulmonary Disease Why: This is the lung doctor office.  If you feel your breathing is getting worse, call this number and we can consider draining fluid from your lung to help you breathe better. Contact information: [place] [address] [phone]        Lucianne Lei, MD Follow up in 1 week(s).   Specialty: Family Medicine Contact information: [address] [place] [address] [phone]              No Known Allergies  Consultations:  Pulmonary    Procedures/Studies: CT ABDOMEN PELVIS WO CONTRAST  Result Date: 10/31/2020 CLINICAL DATA:  86 year old female with epigastric pain. EXAM: CT ABDOMEN  AND PELVIS WITHOUT CONTRAST TECHNIQUE: Multidetector CT imaging of the abdomen and pelvis was performed following the standard protocol without IV contrast. COMPARISON:  CT abdomen pelvis dated 03/20/2017. FINDINGS: Evaluation of this exam is limited in the absence of intravenous contrast. Lower chest: Partially visualized moderate to large right and small left pleural effusions. There is associated partial compressive atelectasis of the lower lobes. Pneumonia is not excluded. There is mild cardiomegaly. No intra-abdominal free air. Small free fluid in the pelvis. Hepatobiliary: Slight irregularity of the liver contour may represent early changes of cirrhosis. Clinical correlation is recommended. No intrahepatic biliary dilatation. The gallbladder is unremarkable. Pancreas: There is a 2.6 x 2.1 x 2.6 cm  exophytic hypodense lesion from the inferior aspect of the uncinate process of the pancreas with small calcific foci. This is not significantly changed since the study of 2018, likely a benign or indolent process such as a side branch IPMN or a small chronic pseudocyst. MRI without and with contrast may provide better characterization. No acute inflammatory changes. No dilatation of the main pancreatic duct. Spleen: Normal in size without focal abnormality. Adrenals/Urinary Tract: The adrenal glands unremarkable. Mild bilateral renal parenchyma atrophy. A 15 mm exophytic hypodense lesion from the lateral interpolar right kidney is not characterized but may represent a cyst. There is no hydronephrosis or nephrolithiasis on either side. The visualized ureters and urinary bladder appear unremarkable. Stomach/Bowel: Small scattered colonic diverticula without active inflammatory changes. There is no bowel obstruction or active inflammation. Appendectomy. Vascular/Lymphatic: Moderate aortoiliac atherosclerotic disease. The IVC is unremarkable. No portal venous gas. There is no adenopathy. There is mild haziness of the mesentery with multiple top-normal lymph nodes with a "misty mesentery" appearance. This finding is nonspecific but may be related to underlying inflammatory/infectious etiology. Reproductive: The uterus and ovaries are grossly unremarkable. Other: None Musculoskeletal: Osteopenia with multilevel degenerative changes of the spine and multilevel disc desiccation and vacuum phenomena. No acute osseous pathology. IMPRESSION: 1. Moderate to large right and small left pleural effusions with associated partial compressive atelectasis of the lower lobes. Pneumonia is not excluded. Clinical correlation is recommended. 2. No acute intra-abdominal or pelvic pathology. No bowel obstruction. 3. Colonic diverticulosis. 4. Stable exophytic hypodense lesion from the inferior aspect of the uncinate process of the pancreas,  likely a benign or indolent process such as a side branch IPMN or a small chronic pseudocyst. MRI without and with contrast may provide better characterization. 5. Aortic Atherosclerosis (ICD10-I70.0). Electronically Signed   By: Anner Crete M.D.   On: 10/31/2020 18:17   DG Chest 2 View  Result Date: 11/05/2020 CLINICAL DATA:  Pleural effusion. History of atrial fibrillation, CHF, diabetes, hypertension. EXAM: CHEST - 2 VIEW COMPARISON:  Chest x-ray 11/02/2020. FINDINGS: Mediastinum and hilar structures normal. Stable cardiomegaly. No pulmonary venous congestion. Slightly improved bibasilar atelectasis/infiltrates. Persistent small bilateral pleural effusions. No interim change. Stable calcifications right upper quadrant. Degenerative change thoracic spine. IMPRESSION: 1. Stable cardiomegaly. No pulmonary venous congestion. 2. Slightly improved bibasilar atelectasis/infiltrates and small bilateral pleural effusions. Electronically Signed   By: Marcello Moores  Register   On: 11/05/2020 08:17   DG Chest 2 View  Result Date: 11/02/2020 CLINICAL DATA:  Chest pain today EXAM: CHEST - 2 VIEW COMPARISON:  10/31/2020 FINDINGS: Mild progression of small right effusion and right lower lobe atelectasis. No change left lower lobe atelectasis and small left effusion. Negative for heart failure. Atherosclerotic aortic arch. IMPRESSION: Mild progression of right lower lobe atelectasis and effusion. No change left lower lobe atelectasis  and effusion. Electronically Signed   By: Franchot Gallo M.D.   On: 11/02/2020 11:12   DG Chest Portable 1 View  Result Date: 10/31/2020 CLINICAL DATA:  Chest pain EXAM: PORTABLE CHEST 1 VIEW COMPARISON:  July 11, 2020 FINDINGS: There are small pleural effusions bilaterally with ill-defined airspace opacity in the lung bases. There is cardiomegaly with mild pulmonary venous hypertension. No adenopathy. There is aortic atherosclerosis. There is degenerative change in each shoulder.  IMPRESSION: There is cardiomegaly with a degree of pulmonary vascular congestion and small pleural effusions. Question a degree of congestive heart failure. Ill-defined airspace opacity in the lung bases may represent edema or patchy pneumonia. Correlation with COVID-19 status may be advisable given this appearance. Aortic Atherosclerosis (ICD10-I70.0). Electronically Signed   By: Lowella Grip III M.D.   On: 10/31/2020 08:18   ECHOCARDIOGRAM COMPLETE  Result Date: 11/01/2020    ECHOCARDIOGRAM REPORT   Patient Name:   HAYZEL RUBERG Date of Exam: 11/01/2020 Medical Rec #:  [id number]        Height:       65.0 in Accession #:    [id number]       Weight:       174.2 lb Date of Birth:  [birth year]        BSA:          1.865 m Patient Age:    86 years         BP:           101/50 mmHg Patient Gender: F                HR:           85 bpm. Exam Location:  Inpatient Procedure: 2D Echo, Cardiac Doppler and Color Doppler Indications:    R94.31 Abnormal EKG  History:        Patient has prior history of Echocardiogram examinations, most                 recent 01/03/2019. CHF, Arrythmias:Atrial Fibrillation; Risk                 Factors:Hypertension and Diabetes.  Sonographer:    Jonelle Sidle Dance Referring Phys: [id number] Jerik Falletta A Laneya Gasaway IMPRESSIONS  1. Left ventricular ejection fraction, by estimation, is 50 to 55%. The left ventricle has low normal function. The left ventricle has no regional wall motion abnormalities. There is mild left ventricular hypertrophy. Left ventricular diastolic function  could not be evaluated.  2. Right ventricular systolic function is moderately reduced. The right ventricular size is normal. There is mildly elevated pulmonary artery systolic pressure.  3. Left atrial size was severely dilated.  4. Right atrial size was mildly dilated.  5. A small pericardial effusion is present.  6. The mitral valve is normal in structure. Moderate mitral valve regurgitation. No evidence of mitral stenosis.  Moderate mitral annular calcification.  7. Tricuspid valve regurgitation is moderate.  8. The aortic valve is tricuspid. Aortic valve regurgitation is mild. Mild aortic valve sclerosis is present, with no evidence of aortic valve stenosis.  9. The inferior vena cava is dilated in size with >50% respiratory variability, suggesting right atrial pressure of 8 mmHg. FINDINGS  Left Ventricle: Left ventricular ejection fraction, by estimation, is 50 to 55%. The left ventricle has low normal function. The left ventricle has no regional wall motion abnormalities. The left ventricular internal cavity size was normal in size. There is mild left ventricular hypertrophy. Left ventricular diastolic function  could not be evaluated due to atrial fibrillation. Left ventricular diastolic function could not be evaluated. Right Ventricle: The right ventricular size is normal. Right ventricular systolic function is moderately reduced. There is mildly elevated pulmonary artery systolic pressure. The tricuspid regurgitant velocity is 2.63 m/s, and with an assumed right atrial pressure of 8 mmHg, the estimated right ventricular systolic pressure is 57.8 mmHg. Left Atrium: Left atrial size was severely dilated. Right Atrium: Right atrial size was mildly dilated. Pericardium: A small pericardial effusion is present. Mitral Valve: The mitral valve is normal in structure. Moderate mitral annular calcification. Moderate mitral valve regurgitation. No evidence of mitral valve stenosis. Tricuspid Valve: The tricuspid valve is normal in structure. Tricuspid valve regurgitation is moderate . No evidence of tricuspid stenosis. Aortic Valve: The aortic valve is tricuspid. Aortic valve regurgitation is mild. Mild aortic valve sclerosis is present, with no evidence of aortic valve stenosis. Pulmonic Valve: The pulmonic valve was normal in structure. Pulmonic valve regurgitation is trivial. No evidence of pulmonic stenosis. Aorta: The aortic root is  normal in size and structure. Venous: The inferior vena cava is dilated in size with greater than 50% respiratory variability, suggesting right atrial pressure of 8 mmHg. IAS/Shunts: No atrial level shunt detected by color flow Doppler.  LEFT VENTRICLE PLAX 2D LVIDd:         4.81 cm LVIDs:         4.11 cm LV PW:         1.50 cm LV IVS:        1.14 cm LVOT diam:     2.20 cm LV SV:         46 LV SV Index:   25 LVOT Area:     3.80 cm  RIGHT VENTRICLE          IVC RV Basal diam:  2.72 cm  IVC diam: 2.43 cm TAPSE (M-mode): 1.3 cm LEFT ATRIUM              Index       RIGHT ATRIUM           Index LA diam:        5.30 cm  2.84 cm/m  RA Area:     21.50 cm LA Vol (A2C):   109.0 ml 58.44 ml/m RA Volume:   67.00 ml  35.92 ml/m LA Vol (A4C):   114.0 ml 61.12 ml/m LA Biplane Vol: 118.0 ml 63.26 ml/m  AORTIC VALVE LVOT Vmax:   61.65 cm/s LVOT Vmean:  41.400 cm/s LVOT VTI:    0.121 m  AORTA Ao Root diam: 3.40 cm Ao Asc diam:  3.70 cm MITRAL VALVE                TRICUSPID VALVE MV Area (PHT): 2.58 cm     TR Peak grad:   27.7 mmHg MV Decel Time: 294 msec     TR Vmax:        263.00 cm/s MV E velocity: 112.50 cm/s                             SHUNTS                             Systemic VTI:  0.12 m  Systemic Diam: 2.20 cm Kirk Ruths MD Electronically signed by Kirk Ruths MD Signature Date/Time: 11/01/2020/1:42:41 PM    Final       Subjective: She is alert, report feeling well, denies dyspnea.   Discharge Exam: Vitals:   11/05/20 0446 11/05/20 0924  BP: (!) 148/89 136/79  Pulse: 69 65  Resp: 17 18  Temp: 98.3 F (36.8 C) 98.4 F (36.9 C)  SpO2: 96% 96%     General: Pt is alert, awake, not in acute distress Cardiovascular: RRR, S1/S2 +, no rubs, no gallops Respiratory: CTA bilaterally, no wheezing, no rhonchi Abdominal: Soft, NT, ND, bowel sounds + Extremities: no edema, no cyanosis    The results of significant diagnostics from this hospitalization (including  imaging, microbiology, ancillary and laboratory) are listed below for reference.     Microbiology: Recent Results (from the past 240 hour(s))  Resp Panel by RT-PCR (Flu A&B, Covid) Nasopharyngeal Swab     Status: None   Collection Time: 10/31/20  8:44 AM   Specimen: Nasopharyngeal Swab; Nasopharyngeal(NP) swabs in vial transport medium  Result Value Ref Range Status   SARS Coronavirus 2 by RT PCR NEGATIVE NEGATIVE Final    Comment: (NOTE) SARS-CoV-2 target nucleic acids are NOT DETECTED.  The SARS-CoV-2 RNA is generally detectable in upper respiratory specimens during the acute phase of infection. The lowest concentration of SARS-CoV-2 viral copies this assay can detect is 138 copies/mL. A negative result does not preclude SARS-Cov-2 infection and should not be used as the sole basis for treatment or other patient management decisions. A negative result may occur with  improper specimen collection/handling, submission of specimen other than nasopharyngeal swab, presence of viral mutation(s) within the areas targeted by this assay, and inadequate number of viral copies(<138 copies/mL). A negative result must be combined with clinical observations, patient history, and epidemiological information. The expected result is Negative.  Fact Sheet for Patients:  EntrepreneurPulse.com.au  Fact Sheet for Healthcare Providers:  IncredibleEmployment.be  This test is no t yet approved or cleared by the [place] FDA and  has been authorized for detection and/or diagnosis of SARS-CoV-2 by FDA under an Emergency Use Authorization (EUA). This EUA will remain  in effect (meaning this test can be used) for the duration of the COVID-19 declaration under Section 564(b)(1) of the Act, 21 U.S.C.section 360bbb-3(b)(1), unless the authorization is terminated  or revoked sooner.       Influenza A by PCR NEGATIVE NEGATIVE Final   Influenza B by PCR NEGATIVE  NEGATIVE Final    Comment: (NOTE) The Xpert Xpress SARS-CoV-2/FLU/RSV plus assay is intended as an aid in the diagnosis of influenza from Nasopharyngeal swab specimens and should not be used as a sole basis for treatment. Nasal washings and aspirates are unacceptable for Xpert Xpress SARS-CoV-2/FLU/RSV testing.  Fact Sheet for Patients: EntrepreneurPulse.com.au  Fact Sheet for Healthcare Providers: IncredibleEmployment.be  This test is not yet approved or cleared by the [place] FDA and has been authorized for detection and/or diagnosis of SARS-CoV-2 by FDA under an Emergency Use Authorization (EUA). This EUA will remain in effect (meaning this test can be used) for the duration of the COVID-19 declaration under Section 564(b)(1) of the Act, 21 U.S.C. section 360bbb-3(b)(1), unless the authorization is terminated or revoked.  Performed at [facility], [place] [address] [place], [place] [address]   Blood Culture (routine x 2)     Status: None   Collection Time: 10/31/20  9:04 AM   Specimen: BLOOD  Result Value Ref Range Status   Specimen Description BLOOD LEFT ANTECUBITAL  Final   Special Requests   Final    BOTTLES DRAWN AEROBIC AND ANAEROBIC Blood Culture results may not be optimal due to an inadequate volume of blood received in culture bottles   Culture   Final    NO GROWTH 5 DAYS Performed at [place] [address], [place], [place] [address]    Report Status 11/05/2020 FINAL  Final  Blood Culture (routine x 2)     Status: None   Collection Time: 10/31/20  9:09 AM   Specimen: BLOOD LEFT FOREARM  Result Value Ref Range Status   Specimen Description BLOOD LEFT FOREARM  Final   Special Requests   Final    BOTTLES DRAWN AEROBIC AND ANAEROBIC Blood Culture adequate volume   Culture   Final    NO GROWTH 5 DAYS Performed at [facility], [place] [address], [place], [place] [address]    Report Status 11/05/2020  FINAL  Final  Urine culture     Status: Abnormal   Collection Time: 10/31/20 10:58 AM   Specimen: In/Out Cath Urine  Result Value Ref Range Status   Specimen Description IN/OUT CATH URINE  Final   Special Requests   Final    NONE Performed at [facility], [place] [address] [place], [place] [address]    Culture (A)  Final    2,000 COLONIES/mL ENTEROCOCCUS AVIUM 2,000 COLONIES/mL STAPHYLOCOCCUS EPIDERMIDIS    Report Status 11/04/2020 FINAL  Final   Organism ID, Bacteria ENTEROCOCCUS AVIUM (A)  Final   Organism ID, Bacteria STAPHYLOCOCCUS EPIDERMIDIS (A)  Final      Susceptibility   Enterococcus avium - MIC*    AMPICILLIN 8 SENSITIVE Sensitive     NITROFURANTOIN 32 SENSITIVE Sensitive     VANCOMYCIN <=0.5 SENSITIVE Sensitive     * 2,000 COLONIES/mL ENTEROCOCCUS AVIUM   Staphylococcus epidermidis - MIC*    CIPROFLOXACIN <=0.5 SENSITIVE Sensitive     GENTAMICIN <=0.5 SENSITIVE Sensitive     NITROFURANTOIN <=16 SENSITIVE Sensitive     OXACILLIN >=4 RESISTANT Resistant     TETRACYCLINE >=16 RESISTANT Resistant     VANCOMYCIN 1 SENSITIVE Sensitive     TRIMETH/SULFA <=10 SENSITIVE Sensitive     CLINDAMYCIN >=8 RESISTANT Resistant     RIFAMPIN 4 RESISTANT Resistant     Inducible Clindamycin NEGATIVE Sensitive     * 2,000 COLONIES/mL STAPHYLOCOCCUS EPIDERMIDIS  MRSA PCR Screening     Status: None   Collection Time: 11/04/20 10:26 AM   Specimen: Nasal Mucosa; Nasopharyngeal  Result Value Ref Range Status   MRSA by PCR NEGATIVE NEGATIVE Final    Comment:        The GeneXpert MRSA Assay (FDA approved for NASAL specimens only), is one component of a comprehensive MRSA colonization surveillance program. It is not intended to diagnose MRSA infection nor to guide or monitor treatment for MRSA infections. Performed at [facility], [place] [address] [place], [place] [address]      Labs: BNP (last 3 results) Recent Labs    10/31/20 1857  BNP 160.7*   Basic Metabolic  Panel: Recent Labs  Lab 11/01/20 0344 11/02/20 0155 11/03/20 0204 11/04/20 0910 11/05/20 0720  NA 139 140 139 141 140  K 4.0 4.0 3.7 3.4* 3.7  CL 105 106 103 104 103  CO2 25 24 25 24 26   GLUCOSE 114* 102* 109* 146* 105*  BUN 17  16 19 19 15   CREATININE 1.17* 1.16* 1.15* 1.17* 1.12*  CALCIUM 8.6* 8.7* 8.8* 9.1 9.3  MG 1.9  --   --   --   --    Liver Function Tests: Recent Labs  Lab 10/31/20 0807 11/02/20 0155  AST 22 18  ALT 15 12  ALKPHOS 43 31*  BILITOT 0.5 0.8  PROT 7.0 5.5*  ALBUMIN 3.5 2.7*   No results for input(s): LIPASE, AMYLASE in the last 168 hours. No results for input(s): AMMONIA in the last 168 hours. CBC: Recent Labs  Lab 10/31/20 0807 11/01/20 0344 11/02/20 0155 11/03/20 0204 11/04/20 0910 11/05/20 0720  WBC 17.5* 7.2 5.6 7.2 6.3 6.8  NEUTROABS 14.3*  --   --   --   --   --   HGB 12.6 10.0* 10.0* 10.2* 10.8* 11.3*  HCT 39.7 31.3* 30.8* 32.4* 34.7* 35.6*  MCV 98.3 97.2 96.0 96.7 95.9 95.4  PLT 186 143* 144* 155 173 177   Cardiac Enzymes: No results for input(s): CKTOTAL, CKMB, CKMBINDEX, TROPONINI in the last 168 hours. BNP: Invalid input(s): POCBNP CBG: Recent Labs  Lab 11/04/20 1136 11/04/20 1622 11/04/20 2147 11/05/20 0657 11/05/20 1115  GLUCAP 98 122* 118* 99 132*   D-Dimer No results for input(s): DDIMER in the last 72 hours. Hgb A1c No results for input(s): HGBA1C in the last 72 hours. Lipid Profile No results for input(s): CHOL, HDL, LDLCALC, TRIG, CHOLHDL, LDLDIRECT in the last 72 hours. Thyroid function studies No results for input(s): TSH, T4TOTAL, T3FREE, THYROIDAB in the last 72 hours.  Invalid input(s): FREET3 Anemia work up No results for input(s): VITAMINB12, FOLATE, FERRITIN, TIBC, IRON, RETICCTPCT in the last 72 hours. Urinalysis    Component Value Date/Time   COLORURINE STRAW (A) 10/31/2020 1058   APPEARANCEUR CLEAR 10/31/2020 1058   LABSPEC 1.010 10/31/2020 1058   PHURINE 6.0 10/31/2020 1058   GLUCOSEU  NEGATIVE 10/31/2020 1058   HGBUR NEGATIVE 10/31/2020 [place] 10/31/2020 1058   KETONESUR NEGATIVE 10/31/2020 1058   PROTEINUR NEGATIVE 10/31/2020 1058   UROBILINOGEN 0.2 06/12/2011 1201   NITRITE NEGATIVE 10/31/2020 1058   LEUKOCYTESUR TRACE (A) 10/31/2020 1058   Sepsis Labs Invalid input(s): PROCALCITONIN,  WBC,  LACTICIDVEN Microbiology Recent Results (from the past 240 hour(s))  Resp Panel by RT-PCR (Flu A&B, Covid) Nasopharyngeal Swab     Status: None   Collection Time: 10/31/20  8:44 AM   Specimen: Nasopharyngeal Swab; Nasopharyngeal(NP) swabs in vial transport medium  Result Value Ref Range Status   SARS Coronavirus 2 by RT PCR NEGATIVE NEGATIVE Final    Comment: (NOTE) SARS-CoV-2 target nucleic acids are NOT DETECTED.  The SARS-CoV-2 RNA is generally detectable in upper respiratory specimens during the acute phase of infection. The lowest concentration of SARS-CoV-2 viral copies this assay can detect is 138 copies/mL. A negative result does not preclude SARS-Cov-2 infection and should not be used as the sole basis for treatment or other patient management decisions. A negative result may occur with  improper specimen collection/handling, submission of specimen other than nasopharyngeal swab, presence of viral mutation(s) within the areas targeted by this assay, and inadequate number of viral copies(<138 copies/mL). A negative result must be combined with clinical observations, patient history, and epidemiological information. The expected result is Negative.  Fact Sheet for Patients:  EntrepreneurPulse.com.au  Fact Sheet for Healthcare Providers:  IncredibleEmployment.be  This test is no t yet approved or cleared by the [place] and  has been authorized  for detection and/or diagnosis of SARS-CoV-2 by FDA under an Emergency Use Authorization (EUA). This EUA will remain  in effect (meaning this test can be  used) for the duration of the COVID-19 declaration under Section 564(b)(1) of the Act, 21 U.S.C.section 360bbb-3(b)(1), unless the authorization is terminated  or revoked sooner.       Influenza A by PCR NEGATIVE NEGATIVE Final   Influenza B by PCR NEGATIVE NEGATIVE Final    Comment: (NOTE) The Xpert Xpress SARS-CoV-2/FLU/RSV plus assay is intended as an aid in the diagnosis of influenza from Nasopharyngeal swab specimens and should not be used as a sole basis for treatment. Nasal washings and aspirates are unacceptable for Xpert Xpress SARS-CoV-2/FLU/RSV testing.  Fact Sheet for Patients: EntrepreneurPulse.com.au  Fact Sheet for Healthcare Providers: IncredibleEmployment.be  This test is not yet approved or cleared by the [place] FDA and has been authorized for detection and/or diagnosis of SARS-CoV-2 by FDA under an Emergency Use Authorization (EUA). This EUA will remain in effect (meaning this test can be used) for the duration of the COVID-19 declaration under Section 564(b)(1) of the Act, 21 U.S.C. section 360bbb-3(b)(1), unless the authorization is terminated or revoked.  Performed at [facility], [place] [address] [place], [place] [address]   Blood Culture (routine x 2)     Status: None   Collection Time: 10/31/20  9:04 AM   Specimen: BLOOD  Result Value Ref Range Status   Specimen Description BLOOD LEFT ANTECUBITAL  Final   Special Requests   Final    BOTTLES DRAWN AEROBIC AND ANAEROBIC Blood Culture results may not be optimal due to an inadequate volume of blood received in culture bottles   Culture   Final    NO GROWTH 5 DAYS Performed at [facility], [place] [address], [place], [place] [address]    Report Status 11/05/2020 FINAL  Final  Blood Culture (routine x 2)     Status: None   Collection Time: 10/31/20  9:09 AM   Specimen: BLOOD LEFT FOREARM  Result Value Ref Range Status   Specimen Description BLOOD  LEFT FOREARM  Final   Special Requests   Final    BOTTLES DRAWN AEROBIC AND ANAEROBIC Blood Culture adequate volume   Culture   Final    NO GROWTH 5 DAYS Performed at [facility], [place] [address] [place], [place] [address]    Report Status 11/05/2020 FINAL  Final  Urine culture     Status: Abnormal   Collection Time: 10/31/20 10:58 AM   Specimen: In/Out Cath Urine  Result Value Ref Range Status   Specimen Description IN/OUT CATH URINE  Final   Special Requests   Final    NONE Performed at [facility], [place] [address], [place], [place] [address]    Culture (A)  Final    2,000 COLONIES/mL ENTEROCOCCUS AVIUM 2,000 COLONIES/mL STAPHYLOCOCCUS EPIDERMIDIS    Report Status 11/04/2020 FINAL  Final   Organism ID, Bacteria ENTEROCOCCUS AVIUM (A)  Final   Organism ID, Bacteria STAPHYLOCOCCUS EPIDERMIDIS (A)  Final      Susceptibility   Enterococcus avium - MIC*    AMPICILLIN 8 SENSITIVE Sensitive     NITROFURANTOIN 32 SENSITIVE Sensitive     VANCOMYCIN <=0.5 SENSITIVE Sensitive     * 2,000 COLONIES/mL ENTEROCOCCUS AVIUM   Staphylococcus epidermidis - MIC*    CIPROFLOXACIN <=0.5 SENSITIVE Sensitive     GENTAMICIN <=0.5 SENSITIVE Sensitive     NITROFURANTOIN <=16  SENSITIVE Sensitive     OXACILLIN >=4 RESISTANT Resistant     TETRACYCLINE >=16 RESISTANT Resistant     VANCOMYCIN 1 SENSITIVE Sensitive     TRIMETH/SULFA <=10 SENSITIVE Sensitive     CLINDAMYCIN >=8 RESISTANT Resistant     RIFAMPIN 4 RESISTANT Resistant     Inducible Clindamycin NEGATIVE Sensitive     * 2,000 COLONIES/mL STAPHYLOCOCCUS EPIDERMIDIS  MRSA PCR Screening     Status: None   Collection Time: 11/04/20 10:26 AM   Specimen: Nasal Mucosa; Nasopharyngeal  Result Value Ref Range Status   MRSA by PCR NEGATIVE NEGATIVE Final    Comment:        The GeneXpert MRSA Assay (FDA approved for NASAL specimens only), is one component of a comprehensive MRSA colonization surveillance program. It is not intended  to diagnose MRSA infection nor to guide or monitor treatment for MRSA infections. Performed at [facility], [place] [address] [place], [place] [address]      Time coordinating discharge: 40 minutes  SIGNED:   Elmarie Shiley, MD  [facility]

## 2020-11-05 NOTE — TOC Transition Note (Signed)
Transition of Care [facility]) - CM/SW Discharge Note   Patient Details  Name: Anne Roach MRN: [id number] Date of Birth: [birth year]  [facility]) CM/SW Contact:  Bartholomew Crews, RN Phone Number: [phone] 11/05/2020, 1:14 PM   Clinical Narrative:     Patient to transition home today. Spoke with daughter, Anne Roach, about caregiver services. Patient has Medicaid and is requiring adl assistance. PCS referral form expedited to [employer] - received call from [place] that referral had been received - pending call back from nurse who will do mini assessment. Alex to provide transportation home. Cory at [place] notified of transition home. No further TOC needs identified.   Final next level of care: [place] Barriers to Discharge: No Barriers Identified   Patient Goals and CMS Choice Patient states their goals for this hospitalization and ongoing recovery are:: home with daughter CMS Medicare.gov Compare Post Acute Care list provided to:: Patient Represenative (must comment) (daughter, Anne Roach) Choice offered to / list presented to : Adult Children Anne Roach)  Discharge Placement                       Discharge Plan and Services In-house Referral: Interpreting Services Discharge Planning Services: CM Consult Post Acute Care Choice: Home Health          DME Arranged: N/A DME Agency: NA       HH Arranged: PT,OT,Nurse's Aide [place] Agency: [place] Date [facility] Agency Contacted: 11/05/20 Time [place]: 1308 Representative spoke with at [place]: [place] ([place]) Interventions     Readmission Risk Interventions No flowsheet data found.

## 2020-11-05 NOTE — Consult Note (Signed)
   [facility] Inpatient Consult   11/05/2020  [name] 15-Feb-1933 [id number]   [place]  Accountable Care Organization [ACO] Patient:  Anne Roach Medicare  PCP: Dr. Lucianne Lei  Follow up for community for care and disease management support.  Plan: Request for referral sent for [facility] RN for complex disease management.  Natividad Brood, RN BSN [facility] Liaison  [phone] business mobile phone Toll free office [phone]  Fax number: [phone] [email or web address]

## 2020-11-05 NOTE — Care Management Important Message (Signed)
Important Message  Patient Details  Name: Anne Roach MRN: [id number] Date of Birth: [birth year]   Medicare Important Message Given:  Yes     Oaklie Durrett P [place] 11/05/2020, 2:20 PM

## 2020-11-05 NOTE — TOC Transition Note (Signed)
Transition of Care [facility]) - CM/SW Discharge Note   Patient Details  Name: Anne Roach MRN: [id number] Date of Birth: 11/13/1933  Transition of Care [facility]) CM/SW Contact:  Anne Crews, RN Phone Number: [phone] 11/05/2020, 4:44 PM   Clinical Narrative:     Received call back this afternoon from nurse at [place], to complete mini assessment for expedited PCS hours. Verified that patient to transition home today. Alvis Lemmings provided as choice for home care agency. [place] to send hours to [facility] - acceptance pending. Notified daughter, [name], by phone of completed assessment and what to expect next. Advised that a more comprehensive assessment will be completed in a couple weeks. Alex verbalized understanding. No further TOC needs identified.   Final next level of care: [place] Barriers to Discharge: No Barriers Identified   Patient Goals and CMS Choice Patient states their goals for this hospitalization and ongoing recovery are:: home with daughter CMS Medicare.gov Compare Post Acute Care list provided to:: Patient Represenative (must comment) (daughter, [name]) Choice offered to / list presented to : Adult Children [name])  Discharge Placement                       Discharge Plan and Services In-house Referral: Interpreting Services Discharge Planning Services: CM Consult Post Acute Care Choice: Home Health          DME Arranged: N/A DME Agency: NA       HH Arranged: PT,OT,Nurse's Aide [place] Agency: [place] Date [facility] Agency Contacted: 11/05/20 Time [place]: 5701 Representative spoke with at [place]: [place] ([place]) Interventions     Readmission Risk Interventions No flowsheet data found.

## 2020-11-05 NOTE — Progress Notes (Signed)
DISCHARGE NOTE HOME Anne Roach to be discharged home per MD order. Discussed prescriptions and follow up appointments with the patient. Prescriptions given to patient; medication list explained in detail. Patient verbalized understanding.  Skin clean, dry and intact without evidence of skin break down, no evidence of skin tears noted. IV catheter discontinued intact. Site without signs and symptoms of complications. Dressing and pressure applied. Pt denies pain at the site currently. No complaints noted.  Patient free of lines, drains, and wounds.   An After Visit Summary (AVS) was printed and given to the patient. Patient escorted via wheelchair, and discharged home via private auto.  [name] S Chace Klippel, RN

## 2020-11-06 DIAGNOSIS — N390 Urinary tract infection, site not specified: Secondary | ICD-10-CM | POA: Diagnosis not present

## 2020-11-06 DIAGNOSIS — A403 Sepsis due to Streptococcus pneumoniae: Secondary | ICD-10-CM | POA: Diagnosis not present

## 2020-11-06 DIAGNOSIS — I110 Hypertensive heart disease with heart failure: Secondary | ICD-10-CM | POA: Diagnosis not present

## 2020-11-06 DIAGNOSIS — E119 Type 2 diabetes mellitus without complications: Secondary | ICD-10-CM | POA: Diagnosis not present

## 2020-11-06 DIAGNOSIS — B957 Other staphylococcus as the cause of diseases classified elsewhere: Secondary | ICD-10-CM | POA: Diagnosis not present

## 2020-11-06 DIAGNOSIS — R6520 Severe sepsis without septic shock: Secondary | ICD-10-CM | POA: Diagnosis not present

## 2020-11-06 DIAGNOSIS — I5033 Acute on chronic diastolic (congestive) heart failure: Secondary | ICD-10-CM | POA: Diagnosis not present

## 2020-11-06 DIAGNOSIS — B952 Enterococcus as the cause of diseases classified elsewhere: Secondary | ICD-10-CM | POA: Diagnosis not present

## 2020-11-06 DIAGNOSIS — A419 Sepsis, unspecified organism: Secondary | ICD-10-CM | POA: Diagnosis not present

## 2020-11-06 DIAGNOSIS — J189 Pneumonia, unspecified organism: Secondary | ICD-10-CM | POA: Diagnosis not present

## 2020-11-06 NOTE — Progress Notes (Signed)
Cardiology Office Note   Date:  11/08/2020   ID:  Kamala, [place] 11/27/1932, MRN [id number]  PCP:  Lucianne Lei, MD  Cardiologist:   Jenkins Rouge, MD   No chief complaint on file.     History of Present Illness: AVALEY COOP is a 86 y.o. female who presents for post hospital f/u afib. Seen in consult 01/01/19 Had fever,chills and dehydration found to be in afib Started on eliquis and cardizem with good rate control TTE 2/17 showed EF 60-65% only mild LAE mild to moderate MR myxomatous  Found to have campylobacter GI infection and UTI Rx with azithromycin and keflex  Getting MTX for gout. Lots of swelling in ankles and feet. Actually  Looks more like lymphedema.    D/c from hospital 07/14/20 abdominal pain Rx for urosepsis ecoli with elevated AST/ALT [place] stones no signs of obstruction D/c on Keflex Bilirubin normal   Chronic dizziness ? Vertigo given Script for Meclizine October 2021   Hospitalized 12/15-12/20 with nausea and vomiting with epigastric pain Some dyspnea WBC 17 lactic acid 3.1 ? Pneumonia and right pleural effusion CT abdomen with diverticulosis no acute pathology RX with Vanc and Cefepime Urine with enterococcus CCM saw and did not think right pleural effusion needed to be tapped. Rx lasix and cardizem and eliquis continued for afib  TTE updated and reviewed 11/01/20  EF 50-55% Severe LAE moderate MR moderate TR Dilated IVC  Finished with antibiotics now No fever Edema gone no palpitations   Past Medical History:  Diagnosis Date  . Atrial fibrillation ([place])   . Chronic diastolic CHF (congestive heart failure) ([place]) 10/31/2020  . DM (diabetes mellitus) ([place])   . HTN (hypertension)   . Tremor     Past Surgical History:  Procedure Laterality Date  . APPENDECTOMY    . CARPAL TUNNEL RELEASE    . CATARACT EXTRACTION Bilateral   . HERNIA REPAIR       Current Outpatient Medications  Medication Sig Dispense Refill  . ACCU-CHEK AVIVA PLUS test strip      . acetaminophen (TYLENOL) 500 MG tablet Take 1,000 mg by mouth every 6 (six) hours as needed for mild pain or headache.    . allopurinol (ZYLOPRIM) 100 MG tablet Take 1 tablet (100 mg total) by mouth daily. 90 tablet 2  . cholecalciferol (VITAMIN D3) 25 MCG (1000 UNIT) tablet Take 1,000 Units by mouth daily.    . colchicine 0.6 MG tablet Take 1 tablet (0.6 mg total) by mouth daily. (Patient taking differently: Take 0.6 mg by mouth daily as needed (for flare up).) 20 tablet 2  . diclofenac Sodium (VOLTAREN) 1 % GEL Apply 2 g topically 4 (four) times daily. 150 g 0  . diltiazem (CARDIZEM CD) 240 MG 24 hr capsule TAKE 1 CAPSULE(240 MG) BY MOUTH DAILY (Patient taking differently: Take 240 mg by mouth daily.) 90 capsule 0  . ELIQUIS 5 MG TABS tablet TAKE 1 TABLET(5 MG) BY MOUTH TWICE DAILY (Patient taking differently: Take 5 mg by mouth 2 (two) times daily.) 60 tablet 6  . furosemide (LASIX) 40 MG tablet Take 1 tablet (40 mg total) by mouth daily. 30 tablet 11  . magnesium oxide (MAG-OX) 400 (241.3 Mg) MG tablet Take 1 tablet (400 mg total) by mouth daily. 30 tablet 0  . meclizine (ANTIVERT) 25 MG tablet Take 1 tablet (25 mg total) by mouth 3 (three) times daily as needed for dizziness. 30 tablet 11  . metFORMIN (GLUCOPHAGE-XR) 750  MG 24 hr tablet Take 750 mg by mouth daily.  2  . Multiple Vitamins-Minerals (ICAPS AREDS 2 PO) Take 2 capsules by mouth daily.    . pantoprazole (PROTONIX) 40 MG tablet Take 40 mg by mouth daily as needed (heartburn).    . potassium chloride SA (KLOR-CON) 20 MEQ tablet Take 2 tablets (40 mEq total) by mouth daily. 30 tablet 0  . traMADol (ULTRAM) 50 MG tablet Take 1 tablet (50 mg total) by mouth every 12 (twelve) hours as needed for up to 5 days for severe pain. 10 tablet 0  . gabapentin (NEURONTIN) 100 MG capsule Take 1 capsule (100 mg total) by mouth 3 (three) times daily. (Patient not taking: Reported on 11/08/2020) 90 capsule 3   Current Facility-Administered  Medications  Medication Dose Route Frequency Provider Last Rate Last Admin  . betamethasone acetate-betamethasone sodium phosphate (CELESTONE) injection 3 mg  3 mg Intramuscular Once Edrick Kins, DPM        Allergies:   Patient has no known allergies.    Social History:  The patient  reports that she has never smoked. She has never used smokeless tobacco. She reports that she does not drink alcohol and does not use drugs.   Family History:  The patient's family history includes Asthma in her brother; Diabetes in her father.    ROS:  Please see the history of present illness.   Otherwise, review of systems are positive for none.   All other systems are reviewed and negative.    PHYSICAL EXAM: VS:  BP 122/64   Pulse 86   Ht 5\' 5"  (1.651 m)   Wt 73.3 kg   SpO2 97%   BMI 26.89 kg/m  , BMI Body mass index is 26.89 kg/m. Affect appropriate Frail elderly female  HEENT: normal Neck supple with no adenopathy JVP normal no bruits no thyromegaly Lungs clear with no wheezing and good diaphragmatic motion Heart:  S1/S2 no murmur, no rub, gallop or click PMI normal Abdomen: benighn, BS positve, no tenderness, no AAA no bruit.  No HSM or HJR Distal pulses intact with no bruits Lymphedema in both legs  Neuro non-focal Skin warm and dry No muscular weakness    EKG:  afib nonspecific ST changes 01/21/19 rate 65   Recent Labs: 10/31/2020: B Natriuretic Peptide 408.0 11/01/2020: Magnesium 1.9 11/02/2020: ALT 12 11/05/2020: BUN 15; Creatinine, Ser 1.12; Hemoglobin 11.3; Platelets 177; Potassium 3.7; Sodium 140    Lipid Panel No results found for: CHOL, TRIG, HDL, CHOLHDL, VLDL, LDLCALC, LDLDIRECT    Wt Readings from Last 3 Encounters:  11/08/20 73.3 kg  11/05/20 79.6 kg  09/03/20 69.3 kg      Other studies Reviewed: Additional studies/ records that were reviewed today include: notes from hospital labs, CXR, ECG TTE.    ASSESSMENT AND PLAN:  1.  Afib:  Rate control  with cardizem and anticoagulation with eliquis Echo benign no indication for [facility] as she is asymptomatic  2.  DM:  Discussed low carb diet.  Target hemoglobin A1c is 6.5 or less.  Continue current medications. 3. HTN: Well controlled.  Continue current medications and low sodium Dash type diet.   4. Dementia:  Continue [place] f/u neurology .  5. GI/GU:  Recent urosepsis with gallstones fever Rx antibiotics  F/u Dr Criss Rosales needs UA rechecked  6. Pleural effusion: does not seem large on exam f/u CXR end of next week  Current medicines are reviewed at length with the patient today.  The patient does not have concerns regarding medicines.  The following changes have been made:  Meclizine 25 tid as needed for dizziness   Labs/ tests ordered today include:  None  No orders of the defined types were placed in this encounter. CXR   Disposition:   FU with cardiology in 8 weeks months      Signed, [name], MD  11/08/2020 8:14 AM    [facility] [address] [place], [place], [place]  [address] Phone: [phone]; Fax: [phone]

## 2020-11-08 ENCOUNTER — Other Ambulatory Visit: Payer: Self-pay

## 2020-11-08 ENCOUNTER — Encounter: Payer: Self-pay | Admitting: Cardiovascular Disease

## 2020-11-08 ENCOUNTER — Ambulatory Visit (INDEPENDENT_AMBULATORY_CARE_PROVIDER_SITE_OTHER): Payer: Medicare HMO | Admitting: Cardiovascular Disease

## 2020-11-08 VITALS — BP 122/64 | HR 86 | Ht 65.0 in | Wt 161.6 lb

## 2020-11-08 DIAGNOSIS — I482 Chronic atrial fibrillation, unspecified: Secondary | ICD-10-CM

## 2020-11-08 DIAGNOSIS — J90 Pleural effusion, not elsewhere classified: Secondary | ICD-10-CM | POA: Diagnosis not present

## 2020-11-08 NOTE — Patient Instructions (Addendum)
Medication Instructions:  *If you need a refill on your cardiac medications before your next appointment, please call your pharmacy*  Lab Work: If you have labs (blood work) drawn today and your tests are completely normal, you will receive your results only by: Marland Kitchen MyChart Message (if you have MyChart) OR . A paper copy in the mail If you have any lab test that is abnormal or we need to change your treatment, we will call you to review the results.  Testing/Procedures: A chest x-ray takes a picture of the organs and structures inside the chest, including the heart, lungs, and blood vessels. This test can show several things, including, whether the heart is enlarges; whether fluid is building up in the lungs; and whether pacemaker / defibrillator leads are still in place. [place].  Follow-Up: At [facility], you and your health needs are our priority.  As part of our continuing mission to provide you with exceptional heart care, we have created designated Provider Care Teams.  These Care Teams include your primary Cardiologist (physician) and Advanced Practice Providers (APPs -  Physician Assistants and Nurse Practitioners) who all work together to provide you with the care you need, when you need it.  We recommend signing up for the patient portal called "MyChart".  Sign up information is provided on this After Visit Summary.  MyChart is used to connect with patients for Virtual Visits (Telemedicine).  Patients are able to view lab/test results, encounter notes, upcoming appointments, etc.  Non-urgent messages can be sent to your provider as well.   To learn more about what you can do with MyChart, go to [email or web address].    Your next appointment:   6 to 8 weeks  The format for your next appointment:   In Person  Provider:   You may see Dr. Johnsie Cancel or one of the following Advanced Practice Providers on your designated Care Team:    Truitt Merle, NP  Cecilie Kicks,  NP  Kathyrn Drown, NP

## 2020-11-09 DIAGNOSIS — J189 Pneumonia, unspecified organism: Secondary | ICD-10-CM | POA: Diagnosis not present

## 2020-11-09 DIAGNOSIS — N390 Urinary tract infection, site not specified: Secondary | ICD-10-CM | POA: Diagnosis not present

## 2020-11-09 DIAGNOSIS — I5033 Acute on chronic diastolic (congestive) heart failure: Secondary | ICD-10-CM | POA: Diagnosis not present

## 2020-11-09 DIAGNOSIS — E119 Type 2 diabetes mellitus without complications: Secondary | ICD-10-CM | POA: Diagnosis not present

## 2020-11-09 DIAGNOSIS — B957 Other staphylococcus as the cause of diseases classified elsewhere: Secondary | ICD-10-CM | POA: Diagnosis not present

## 2020-11-09 DIAGNOSIS — I110 Hypertensive heart disease with heart failure: Secondary | ICD-10-CM | POA: Diagnosis not present

## 2020-11-09 DIAGNOSIS — R6520 Severe sepsis without septic shock: Secondary | ICD-10-CM | POA: Diagnosis not present

## 2020-11-09 DIAGNOSIS — A419 Sepsis, unspecified organism: Secondary | ICD-10-CM | POA: Diagnosis not present

## 2020-11-09 DIAGNOSIS — B952 Enterococcus as the cause of diseases classified elsewhere: Secondary | ICD-10-CM | POA: Diagnosis not present

## 2020-11-13 DIAGNOSIS — B952 Enterococcus as the cause of diseases classified elsewhere: Secondary | ICD-10-CM | POA: Diagnosis not present

## 2020-11-13 DIAGNOSIS — J189 Pneumonia, unspecified organism: Secondary | ICD-10-CM | POA: Diagnosis not present

## 2020-11-13 DIAGNOSIS — A419 Sepsis, unspecified organism: Secondary | ICD-10-CM | POA: Diagnosis not present

## 2020-11-13 DIAGNOSIS — B957 Other staphylococcus as the cause of diseases classified elsewhere: Secondary | ICD-10-CM | POA: Diagnosis not present

## 2020-11-13 DIAGNOSIS — N390 Urinary tract infection, site not specified: Secondary | ICD-10-CM | POA: Diagnosis not present

## 2020-11-13 DIAGNOSIS — I5033 Acute on chronic diastolic (congestive) heart failure: Secondary | ICD-10-CM | POA: Diagnosis not present

## 2020-11-13 DIAGNOSIS — R6520 Severe sepsis without septic shock: Secondary | ICD-10-CM | POA: Diagnosis not present

## 2020-11-13 DIAGNOSIS — E119 Type 2 diabetes mellitus without complications: Secondary | ICD-10-CM | POA: Diagnosis not present

## 2020-11-13 DIAGNOSIS — I110 Hypertensive heart disease with heart failure: Secondary | ICD-10-CM | POA: Diagnosis not present

## 2020-11-14 ENCOUNTER — Other Ambulatory Visit: Payer: Self-pay

## 2020-11-14 NOTE — Patient Outreach (Signed)
[facility]) Care Management  11/14/2020  Anne Roach 09-Sep-1933 [id number]   Referral Date: 11/14/20 Referral Source: Hospital liaison Referral Reason: [facility]   Outreach Attempt: Spoke with daughter Anne Roach who is caregiver.  She reports patient doing better since discharge. She states patient did see her cardiologist and visit went well. She is working on PCP appointment.  She also reports that she will be calling about possible PCS. She states previously patient did not qualify.     Social: Patient lives with daughter Anne Roach who is her caregiver.  Patient needs assistance with all ADL's.     Conditions: Patient with recent sepsis. Patient treated in the hospital with antibiotics per daughter. Patient also has diabetes and A. Fibrillation.  Daughter states that they both are controlled with medications.    Medications: Patient takes her medications.  Daughter denies problems with medications or affording medications.    Appointments:  Patient sees physicians regularly and daughter provides transportation.     Advanced Directives: Patient does not have advanced directives.    Consent: Discussed [facility] services and support.  Daughter declines at this time.    Plan: RN CM will close case.    Bary Leriche, RN, MSN [facility] Care Management Care Management Coordinator Direct Line [phone] Toll Free: [phone]  Fax: [phone]

## 2020-11-15 DIAGNOSIS — B952 Enterococcus as the cause of diseases classified elsewhere: Secondary | ICD-10-CM | POA: Diagnosis not present

## 2020-11-15 DIAGNOSIS — N390 Urinary tract infection, site not specified: Secondary | ICD-10-CM | POA: Diagnosis not present

## 2020-11-15 DIAGNOSIS — E119 Type 2 diabetes mellitus without complications: Secondary | ICD-10-CM | POA: Diagnosis not present

## 2020-11-15 DIAGNOSIS — R6520 Severe sepsis without septic shock: Secondary | ICD-10-CM | POA: Diagnosis not present

## 2020-11-15 DIAGNOSIS — A419 Sepsis, unspecified organism: Secondary | ICD-10-CM | POA: Diagnosis not present

## 2020-11-15 DIAGNOSIS — I110 Hypertensive heart disease with heart failure: Secondary | ICD-10-CM | POA: Diagnosis not present

## 2020-11-15 DIAGNOSIS — B957 Other staphylococcus as the cause of diseases classified elsewhere: Secondary | ICD-10-CM | POA: Diagnosis not present

## 2020-11-15 DIAGNOSIS — I5033 Acute on chronic diastolic (congestive) heart failure: Secondary | ICD-10-CM | POA: Diagnosis not present

## 2020-11-15 DIAGNOSIS — J189 Pneumonia, unspecified organism: Secondary | ICD-10-CM | POA: Diagnosis not present

## 2020-11-17 ENCOUNTER — Other Ambulatory Visit: Payer: Self-pay | Admitting: Podiatry

## 2020-11-17 ENCOUNTER — Other Ambulatory Visit: Payer: Self-pay | Admitting: Cardiovascular Disease

## 2020-11-19 DIAGNOSIS — B952 Enterococcus as the cause of diseases classified elsewhere: Secondary | ICD-10-CM | POA: Diagnosis not present

## 2020-11-19 DIAGNOSIS — I110 Hypertensive heart disease with heart failure: Secondary | ICD-10-CM | POA: Diagnosis not present

## 2020-11-19 DIAGNOSIS — A419 Sepsis, unspecified organism: Secondary | ICD-10-CM | POA: Diagnosis not present

## 2020-11-19 DIAGNOSIS — E119 Type 2 diabetes mellitus without complications: Secondary | ICD-10-CM | POA: Diagnosis not present

## 2020-11-19 DIAGNOSIS — J189 Pneumonia, unspecified organism: Secondary | ICD-10-CM | POA: Diagnosis not present

## 2020-11-19 DIAGNOSIS — N390 Urinary tract infection, site not specified: Secondary | ICD-10-CM | POA: Diagnosis not present

## 2020-11-19 DIAGNOSIS — R6520 Severe sepsis without septic shock: Secondary | ICD-10-CM | POA: Diagnosis not present

## 2020-11-19 DIAGNOSIS — B957 Other staphylococcus as the cause of diseases classified elsewhere: Secondary | ICD-10-CM | POA: Diagnosis not present

## 2020-11-19 DIAGNOSIS — I5033 Acute on chronic diastolic (congestive) heart failure: Secondary | ICD-10-CM | POA: Diagnosis not present

## 2020-11-20 ENCOUNTER — Other Ambulatory Visit: Payer: Self-pay

## 2020-11-20 MED ORDER — DILTIAZEM HCL ER COATED BEADS 240 MG PO CP24: ORAL_CAPSULE | ORAL | 3 refills | Status: DC

## 2020-11-21 DIAGNOSIS — B957 Other staphylococcus as the cause of diseases classified elsewhere: Secondary | ICD-10-CM | POA: Diagnosis not present

## 2020-11-21 DIAGNOSIS — I110 Hypertensive heart disease with heart failure: Secondary | ICD-10-CM | POA: Diagnosis not present

## 2020-11-21 DIAGNOSIS — B952 Enterococcus as the cause of diseases classified elsewhere: Secondary | ICD-10-CM | POA: Diagnosis not present

## 2020-11-21 DIAGNOSIS — A419 Sepsis, unspecified organism: Secondary | ICD-10-CM | POA: Diagnosis not present

## 2020-11-21 DIAGNOSIS — N390 Urinary tract infection, site not specified: Secondary | ICD-10-CM | POA: Diagnosis not present

## 2020-11-21 DIAGNOSIS — I5033 Acute on chronic diastolic (congestive) heart failure: Secondary | ICD-10-CM | POA: Diagnosis not present

## 2020-11-21 DIAGNOSIS — J189 Pneumonia, unspecified organism: Secondary | ICD-10-CM | POA: Diagnosis not present

## 2020-11-21 DIAGNOSIS — R6520 Severe sepsis without septic shock: Secondary | ICD-10-CM | POA: Diagnosis not present

## 2020-11-21 DIAGNOSIS — E119 Type 2 diabetes mellitus without complications: Secondary | ICD-10-CM | POA: Diagnosis not present

## 2020-11-23 NOTE — Telephone Encounter (Signed)
Please advise 

## 2020-11-26 DIAGNOSIS — B952 Enterococcus as the cause of diseases classified elsewhere: Secondary | ICD-10-CM | POA: Diagnosis not present

## 2020-11-26 DIAGNOSIS — A419 Sepsis, unspecified organism: Secondary | ICD-10-CM | POA: Diagnosis not present

## 2020-11-26 DIAGNOSIS — E119 Type 2 diabetes mellitus without complications: Secondary | ICD-10-CM | POA: Diagnosis not present

## 2020-11-26 DIAGNOSIS — I110 Hypertensive heart disease with heart failure: Secondary | ICD-10-CM | POA: Diagnosis not present

## 2020-11-26 DIAGNOSIS — J189 Pneumonia, unspecified organism: Secondary | ICD-10-CM | POA: Diagnosis not present

## 2020-11-26 DIAGNOSIS — I5033 Acute on chronic diastolic (congestive) heart failure: Secondary | ICD-10-CM | POA: Diagnosis not present

## 2020-11-26 DIAGNOSIS — N390 Urinary tract infection, site not specified: Secondary | ICD-10-CM | POA: Diagnosis not present

## 2020-11-26 DIAGNOSIS — B957 Other staphylococcus as the cause of diseases classified elsewhere: Secondary | ICD-10-CM | POA: Diagnosis not present

## 2020-11-26 DIAGNOSIS — R6520 Severe sepsis without septic shock: Secondary | ICD-10-CM | POA: Diagnosis not present

## 2020-11-28 ENCOUNTER — Ambulatory Visit
Admission: RE | Admit: 2020-11-28 | Discharge: 2020-11-28 | Disposition: A | Discharge status: Home / Self Care | Payer: Medicare HMO | Source: Ambulatory Visit | Attending: Cardiovascular Disease | Admitting: Cardiovascular Disease

## 2020-11-28 DIAGNOSIS — J9811 Atelectasis: Secondary | ICD-10-CM | POA: Diagnosis not present

## 2020-11-28 DIAGNOSIS — J90 Pleural effusion, not elsewhere classified: Secondary | ICD-10-CM | POA: Diagnosis not present

## 2020-11-28 DIAGNOSIS — R6520 Severe sepsis without septic shock: Secondary | ICD-10-CM | POA: Diagnosis not present

## 2020-11-28 DIAGNOSIS — I517 Cardiomegaly: Secondary | ICD-10-CM | POA: Diagnosis not present

## 2020-11-28 DIAGNOSIS — J189 Pneumonia, unspecified organism: Secondary | ICD-10-CM | POA: Diagnosis not present

## 2020-11-28 DIAGNOSIS — N390 Urinary tract infection, site not specified: Secondary | ICD-10-CM | POA: Diagnosis not present

## 2020-11-28 DIAGNOSIS — I482 Chronic atrial fibrillation, unspecified: Secondary | ICD-10-CM

## 2020-11-28 DIAGNOSIS — B952 Enterococcus as the cause of diseases classified elsewhere: Secondary | ICD-10-CM | POA: Diagnosis not present

## 2020-11-28 DIAGNOSIS — B957 Other staphylococcus as the cause of diseases classified elsewhere: Secondary | ICD-10-CM | POA: Diagnosis not present

## 2020-11-28 DIAGNOSIS — I700 Atherosclerosis of aorta: Secondary | ICD-10-CM | POA: Diagnosis not present

## 2020-11-28 DIAGNOSIS — I5033 Acute on chronic diastolic (congestive) heart failure: Secondary | ICD-10-CM | POA: Diagnosis not present

## 2020-11-28 DIAGNOSIS — I110 Hypertensive heart disease with heart failure: Secondary | ICD-10-CM | POA: Diagnosis not present

## 2020-11-28 DIAGNOSIS — E119 Type 2 diabetes mellitus without complications: Secondary | ICD-10-CM | POA: Diagnosis not present

## 2020-11-28 DIAGNOSIS — A419 Sepsis, unspecified organism: Secondary | ICD-10-CM | POA: Diagnosis not present

## 2020-11-28 DIAGNOSIS — H353211 Exudative age-related macular degeneration, right eye, with active choroidal neovascularization: Secondary | ICD-10-CM | POA: Diagnosis not present

## 2020-12-04 ENCOUNTER — Inpatient Hospital Stay: Payer: Medicare HMO | Admitting: Pulmonary Disease

## 2020-12-05 DIAGNOSIS — N390 Urinary tract infection, site not specified: Secondary | ICD-10-CM | POA: Diagnosis not present

## 2020-12-05 DIAGNOSIS — R6520 Severe sepsis without septic shock: Secondary | ICD-10-CM | POA: Diagnosis not present

## 2020-12-05 DIAGNOSIS — I110 Hypertensive heart disease with heart failure: Secondary | ICD-10-CM | POA: Diagnosis not present

## 2020-12-05 DIAGNOSIS — B952 Enterococcus as the cause of diseases classified elsewhere: Secondary | ICD-10-CM | POA: Diagnosis not present

## 2020-12-05 DIAGNOSIS — B957 Other staphylococcus as the cause of diseases classified elsewhere: Secondary | ICD-10-CM | POA: Diagnosis not present

## 2020-12-05 DIAGNOSIS — A419 Sepsis, unspecified organism: Secondary | ICD-10-CM | POA: Diagnosis not present

## 2020-12-05 DIAGNOSIS — J189 Pneumonia, unspecified organism: Secondary | ICD-10-CM | POA: Diagnosis not present

## 2020-12-05 DIAGNOSIS — E119 Type 2 diabetes mellitus without complications: Secondary | ICD-10-CM | POA: Diagnosis not present

## 2020-12-05 DIAGNOSIS — I5033 Acute on chronic diastolic (congestive) heart failure: Secondary | ICD-10-CM | POA: Diagnosis not present

## 2020-12-12 ENCOUNTER — Ambulatory Visit (INDEPENDENT_AMBULATORY_CARE_PROVIDER_SITE_OTHER): Payer: Medicare HMO | Admitting: Pulmonary Disease

## 2020-12-12 ENCOUNTER — Other Ambulatory Visit: Payer: Self-pay

## 2020-12-12 ENCOUNTER — Encounter: Payer: Self-pay | Admitting: Pulmonary Disease

## 2020-12-12 VITALS — BP 116/74 | HR 71 | Temp 97.7°F | Ht 65.0 in | Wt 166.0 lb

## 2020-12-12 DIAGNOSIS — I5032 Chronic diastolic (congestive) heart failure: Secondary | ICD-10-CM

## 2020-12-12 DIAGNOSIS — H353221 Exudative age-related macular degeneration, left eye, with active choroidal neovascularization: Secondary | ICD-10-CM | POA: Diagnosis not present

## 2020-12-12 LAB — BASIC METABOLIC PANEL
BUN: 20 mg/dL (ref 6–23)
CO2: 27 mEq/L (ref 19–32)
Calcium: 9.3 mg/dL (ref 8.4–10.5)
Chloride: 105 mEq/L (ref 96–112)
Creatinine, Ser: 1.13 mg/dL (ref 0.40–1.20)
GFR: 44.3 mL/min — ABNORMAL LOW (ref >60.00)
Glucose, Bld: 106 mg/dL — ABNORMAL HIGH (ref 70–99)
Potassium: 4.1 mEq/L (ref 3.5–5.1)
Sodium: 139 mEq/L (ref 135–145)

## 2020-12-12 LAB — MAGNESIUM: Magnesium: 2.1 mg/dL (ref 1.5–2.5)

## 2020-12-12 NOTE — Progress Notes (Signed)
@Patient  ID: Anne Roach, female    DOB: 02-14-1933, 87 y.o.   MRN: [id number]  Chief Complaint  Patient presents with  . Hospitalization Follow-up    HFU. States she has been doing well since being home. Had a recent CXR a few weeks with cardiology.     Referring provider: Lucianne Lei, MD  HPI:   87 year old woman whom are seen in hospital follow-up after volume overload and consult pleural effusions.  Hospital notes reviewed.  Recent cardiology visit and note reviewed.  Patient was seen by me in hospital.  She was brought overload.  Chest x-ray revealed bilateral pleural effusion without evidence of obvious infiltrate.  CT abdomen pelvis also reviewed interpreted as no pneumonia in the bases on lung the seen compressive atelectasis bilaterally.  After couple days of diuresis I was called to evaluate pleural effusions.  Minimal symptoms at that time with improvement in oxygenation as well as dyspnea exertion.  Recommended ongoing diuresis for likely cardiogenic source of bilateral pleural effusions.  TTE at that time reviewed and notable for severe LA dilation, mitral valve regurg, aortic valve regurg.  RA pressure is also elevated at that time.  To essentially discharge after another day or 2 of diuresis.  She was feeling better.  She continued to feel well.  Denies respirations at this time.  Has been followed by cardiology with repeat chest x-ray early January 2022 which on my interpretation reveals clear lungs, trace bilateral pleural effusions much improved from prior.  PMH: Atrial fibrillation, diastolic dysfunction, MVR, AVR Surgical history: Appendectomy, hernia repair Family history: Father with diabetes, mother with asthma Social history: Never smoker, lives in [place] with family  ACT:  No flowsheet data found.  MMRC: No flowsheet data found.  Epworth:  No flowsheet data found.  Tests:   FENO:  No results found for: NITRICOXIDE  PFT: No flowsheet data  found.  WALK:  No flowsheet data found.  Imaging: Personally reviewed and as per EMR DG Chest 2 View  Result Date: 11/28/2020 CLINICAL DATA:  Pleural effusion EXAM: CHEST - 2 VIEW COMPARISON:  11/05/2020 FINDINGS: Improved aeration with resolution of bilateral effusions. Residual partial atelectasis at the right middle lobe. Cardiomegaly with aortic atherosclerosis. No pneumothorax. IMPRESSION: Improved aeration with resolution of bilateral effusions. Residual partial atelectasis at the right middle lobe for which 1 additional radiographic follow-up is recommended. Cardiomegaly Electronically Signed   By: Donavan Foil M.D.   On: 11/28/2020 19:54    Lab Results: Personally reviewed and as per EMR CBC    Component Value Date/Time   WBC 6.8 11/05/2020 0720   RBC 3.73 (L) 11/05/2020 0720   HGB 11.3 (L) 11/05/2020 0720   HCT 35.6 (L) 11/05/2020 0720   PLT 177 11/05/2020 0720   MCV 95.4 11/05/2020 0720   MCH 30.3 11/05/2020 0720   MCHC 31.7 11/05/2020 0720   RDW 14.2 11/05/2020 0720   LYMPHSABS 1.9 10/31/2020 0807   MONOABS 1.0 10/31/2020 0807   EOSABS 0.1 10/31/2020 0807   BASOSABS 0.0 10/31/2020 0807    BMET    Component Value Date/Time   NA 140 11/05/2020 0720   K 3.7 11/05/2020 0720   CL 103 11/05/2020 0720   CO2 26 11/05/2020 0720   GLUCOSE 105 (H) 11/05/2020 0720   BUN 15 11/05/2020 0720   CREATININE 1.12 (H) 11/05/2020 0720   CALCIUM 9.3 11/05/2020 0720   GFRNONAA 48 (L) 11/05/2020 0720   GFRAA 58 (L) 07/14/2020 0424    BNP  Component Value Date/Time   BNP 408.0 (H) 10/31/2020 1857    ProBNP No results found for: PROBNP  Specialty Problems      Pulmonary Problems   Incidental lung nodule    Qualifier: Diagnosis of  By: Johnsie Cancel, MD, Rona Ravens  CHEST CT WITH CONTRAST: Technique:  Multidetector CT imaging of the chest was performed following the standard protocol with oral contrast and during bolus administration of intravenous  contrast. Contrast:  150 cc Omnipaque 300.  Oral contrast was administered.  Comparison:  CT of the abdomen dated 10/10/03 and abdominal ultrasound dated 05/16/05. A single ovoid noncalcified pulmonary nodule is present in the lower and posterior aspect of the left upper lobe on image number 27.  This measures approximately 6 x 10 mm.  No associated adenopathy.  No pleural or pericardial fluid is seen.  The heart is mildly enlarged.  The bony thorax is unremarkable.   IMPRESSION: 6 x 10 mm ovoid nodule in inferior and posterior aspect of left upper lobe.  No associated adenopathy.  The chest has not been previously evaluated by CT.  It is recommended that followup be performed in three to six months to reevaluate the pulmonary nodule.       Other diseases of lung, not elsewhere classified    Overview:  Overview:  Qualifier: Diagnosis of  By: Johnsie Cancel, MD, Rona Ravens      OSA (obstructive sleep apnea)    Split 01/2012:  AHI 87/hr, high pressure needs and optimal pressure not reached during titration phase.  Auto 2013:  Optimal pressure 17cm Pt could only tolerate 14cm, and download 2014 shows adequate control at this pressure.       PNA (pneumonia)      No Known Allergies  Immunization History  Administered Date(s) Administered  . Influenza Split 08/18/2011, 07/12/2012  . Influenza, High Dose Seasonal PF 09/15/2018  . Tdap 07/22/2015    87 year old woman whom:  Diagnosis Date  . Atrial fibrillation ([place])   . Chronic diastolic CHF (congestive heart failure) ([place]) 10/31/2020  . DM (diabetes mellitus) ([place])   . HTN (hypertension)   . Tremor     Tobacco History: Social History   Tobacco Use  Smoking Status Never Smoker  Smokeless Tobacco Never Used   Counseling given: Not Answered   Continue to not smoke  Outpatient Encounter Medications as of 12/12/2020  Medication Sig  . ACCU-CHEK AVIVA PLUS test strip   . acetaminophen (TYLENOL) 500 MG tablet Take 1,000 mg by  mouth every 6 (six) hours as needed for mild pain or headache.  . allopurinol (ZYLOPRIM) 100 MG tablet TAKE 1 TABLET(100 MG) BY MOUTH DAILY  . cholecalciferol (VITAMIN D3) 25 MCG (1000 UNIT) tablet Take 1,000 Units by mouth daily.  . colchicine 0.6 MG tablet Take 1 tablet (0.6 mg total) by mouth daily. (Patient taking differently: Take 0.6 mg by mouth daily as needed (for flare up).)  . diclofenac Sodium (VOLTAREN) 1 % GEL Apply 2 g topically 4 (four) times daily.  Marland Kitchen diltiazem (CARDIZEM CD) 240 MG 24 hr capsule TAKE 1 CAPSULE(240 MG) BY MOUTH DAILY  . ELIQUIS 5 MG TABS tablet TAKE 1 TABLET(5 MG) BY MOUTH TWICE DAILY (Patient taking differently: Take 5 mg by mouth 2 (two) times daily.)  . furosemide (LASIX) 40 MG tablet Take 1 tablet (40 mg total) by mouth daily.  Marland Kitchen gabapentin (NEURONTIN) 100 MG capsule Take 1 capsule (100 mg total) by mouth 3 (three) times daily.  . magnesium  oxide (MAG-OX) 400 (241.3 Mg) MG tablet Take 1 tablet (400 mg total) by mouth daily.  . metFORMIN (GLUCOPHAGE-XR) 750 MG 24 hr tablet Take 750 mg by mouth daily.  . Multiple Vitamins-Minerals (ICAPS AREDS 2 PO) Take 2 capsules by mouth daily.  . potassium chloride SA (KLOR-CON) 20 MEQ tablet Take 2 tablets (40 mEq total) by mouth daily.  . [DISCONTINUED] meclizine (ANTIVERT) 25 MG tablet Take 1 tablet (25 mg total) by mouth 3 (three) times daily as needed for dizziness.  . [DISCONTINUED] pantoprazole (PROTONIX) 40 MG tablet Take 40 mg by mouth daily as needed (heartburn).   Facility-Administered Encounter Medications as of 12/12/2020  Medication  . betamethasone acetate-betamethasone sodium phosphate (CELESTONE) injection 3 mg     Review of Systems  Review of Systems  No chest pain exertion.  No orthopnea or PND.  Comprehensive with otherwise negative. Physical Exam  BP 116/74   Pulse 71   Temp 97.7 F (36.5 C) (Temporal)   Ht 5\' 5"  (1.651 m)   Wt 166 lb (75.3 kg)   SpO2 96% Comment: on RA  BMI 27.62 kg/m    Wt Readings from Last 5 Encounters:  12/12/20 166 lb (75.3 kg)  11/08/20 161 lb 9.6 oz (73.3 kg)  11/05/20 175 lb 8.8 oz (79.6 kg)  09/03/20 152 lb 12.8 oz (69.3 kg)  08/18/19 177 lb (80.3 kg)    BMI Readings from Last 5 Encounters:  12/12/20 27.62 kg/m  11/08/20 26.89 kg/m  11/05/20 29.21 kg/m  09/03/20 24.66 kg/m  08/18/19 28.57 kg/m     Physical Exam General: Well-appearing, sitting up in wheelchair Respiratory: Clear aspiration bilaterally, no diminished sounds in the bases, no crackles Cardiovascular: Regular rhythm, no murmurs    Assessment & Plan:   Bilateral pleural effusions: In the setting of gross volume overload and hospitalization 12/21.  TTE at that time with severe left atrial dilation, moderate MVR, mild AVR, elevated right atrial pressure.  Subsequently improved with diuretic therapy, essentially resolved on chest x-ray earlier 11/2020.  Likely related to diastolic dysfunction.  Advised her to continue Lasix, will check labs today, see if ongoing potassium supplementation as needed.  Happy to provide a refill but ongoing Lasix and potassium supplementation if needed should be directed to cardiology office.   Return if symptoms worsen or fail to improve.   Lanier Clam, MD 12/12/2020

## 2020-12-12 NOTE — Patient Instructions (Addendum)
The chest xray is better - fluid is mostly gone. Nothing further to do at this time.  We will check labs today to see if you need to continue potassium.   If you need potassium I will message Dr. Johnsie Cancel to help fill this in the future. He should also refill the lasix as well.  No need for pulmonary follow up - please call if we can help in any way.

## 2020-12-15 DIAGNOSIS — I10 Essential (primary) hypertension: Secondary | ICD-10-CM | POA: Diagnosis not present

## 2020-12-15 DIAGNOSIS — I4811 Longstanding persistent atrial fibrillation: Secondary | ICD-10-CM | POA: Diagnosis not present

## 2020-12-15 DIAGNOSIS — E1100 Type 2 diabetes mellitus with hyperosmolarity without nonketotic hyperglycemic-hyperosmolar coma (NKHHC): Secondary | ICD-10-CM | POA: Diagnosis not present

## 2020-12-17 ENCOUNTER — Other Ambulatory Visit: Payer: Self-pay | Admitting: Pulmonary Disease

## 2020-12-17 DIAGNOSIS — I5033 Acute on chronic diastolic (congestive) heart failure: Secondary | ICD-10-CM | POA: Diagnosis not present

## 2020-12-17 DIAGNOSIS — E119 Type 2 diabetes mellitus without complications: Secondary | ICD-10-CM | POA: Diagnosis not present

## 2020-12-17 DIAGNOSIS — A419 Sepsis, unspecified organism: Secondary | ICD-10-CM | POA: Diagnosis not present

## 2020-12-17 DIAGNOSIS — R6520 Severe sepsis without septic shock: Secondary | ICD-10-CM | POA: Diagnosis not present

## 2020-12-17 DIAGNOSIS — N390 Urinary tract infection, site not specified: Secondary | ICD-10-CM | POA: Diagnosis not present

## 2020-12-17 DIAGNOSIS — I110 Hypertensive heart disease with heart failure: Secondary | ICD-10-CM | POA: Diagnosis not present

## 2020-12-17 DIAGNOSIS — J189 Pneumonia, unspecified organism: Secondary | ICD-10-CM | POA: Diagnosis not present

## 2020-12-17 DIAGNOSIS — B957 Other staphylococcus as the cause of diseases classified elsewhere: Secondary | ICD-10-CM | POA: Diagnosis not present

## 2020-12-17 DIAGNOSIS — B952 Enterococcus as the cause of diseases classified elsewhere: Secondary | ICD-10-CM | POA: Diagnosis not present

## 2020-12-17 MED ORDER — POTASSIUM CHLORIDE CRYS ER 20 MEQ PO TBCR: 40.0000 meq | EXTENDED_RELEASE_TABLET | Freq: Every day | ORAL | 0 refills | Status: DC

## 2020-12-17 NOTE — Progress Notes (Signed)
Refill of potassium supplement x 1 month. Future request to cardiology office given recent admission for volume overload improved with lasix therapy.

## 2020-12-17 NOTE — Progress Notes (Deleted)
Cardiology Office Note   Date:  12/17/2020   ID:  Anne, Roach 1933-07-12, MRN [id number]  PCP:  Anne Lei, MD  Cardiologist:   Jenkins Rouge, MD   No chief complaint on file.     History of Present Illness: Anne Roach is a 87 y.o. female who presents for post hospital f/u afib. Seen in consult 01/01/19 Had fever,chills and dehydration found to be in afib Started on eliquis and cardizem with good rate control TTE 2/17 showed EF 60-65% only mild LAE mild to moderate MR myxomatous  Found to have campylobacter GI infection and UTI Rx with azithromycin and keflex  Getting MTX for gout. Lots of swelling in ankles and feet. Actually  Looks more like lymphedema.    D/c from hospital 07/14/20 abdominal pain Rx for urosepsis ecoli with elevated AST/ALT [place] stones no signs of obstruction D/c on Keflex Bilirubin normal   Chronic dizziness ? Vertigo given Script for Meclizine October 2021   Hospitalized 12/15-12/20 with nausea and vomiting with epigastric pain Some dyspnea WBC 17 lactic acid 3.1 ? Pneumonia and right pleural effusion CT abdomen with diverticulosis no acute pathology RX with Vanc and Cefepime Urine with enterococcus CCM saw and did not think right pleural effusion needed to be tapped. Rx lasix and cardizem and eliquis continued for afib  TTE updated and reviewed 11/01/20  EF 50-55% Severe LAE moderate MR moderate TR Dilated IVC  Finished with antibiotics now No fever Edema gone no palpitations CXR 11/28/20 resolved pleural effusion   ***  Past Medical History:  Diagnosis Date  . Atrial fibrillation ([place])   . Chronic diastolic CHF (congestive heart failure) ([place]) 10/31/2020  . DM (diabetes mellitus) ([place])   . HTN (hypertension)   . Tremor     Past Surgical History:  Procedure Laterality Date  . APPENDECTOMY    . CARPAL TUNNEL RELEASE    . CATARACT EXTRACTION Bilateral   . HERNIA REPAIR       Current Outpatient Medications  Medication Sig Dispense  Refill  . ACCU-CHEK AVIVA PLUS test strip     . acetaminophen (TYLENOL) 500 MG tablet Take 1,000 mg by mouth every 6 (six) hours as needed for mild pain or headache.    . allopurinol (ZYLOPRIM) 100 MG tablet TAKE 1 TABLET(100 MG) BY MOUTH DAILY 90 tablet 2  . cholecalciferol (VITAMIN D3) 25 MCG (1000 UNIT) tablet Take 1,000 Units by mouth daily.    . colchicine 0.6 MG tablet Take 1 tablet (0.6 mg total) by mouth daily. (Patient taking differently: Take 0.6 mg by mouth daily as needed (for flare up).) 20 tablet 2  . diclofenac Sodium (VOLTAREN) 1 % GEL Apply 2 g topically 4 (four) times daily. 150 g 0  . diltiazem (CARDIZEM CD) 240 MG 24 hr capsule TAKE 1 CAPSULE(240 MG) BY MOUTH DAILY 90 capsule 3  . ELIQUIS 5 MG TABS tablet TAKE 1 TABLET(5 MG) BY MOUTH TWICE DAILY (Patient taking differently: Take 5 mg by mouth 2 (two) times daily.) 60 tablet 6  . furosemide (LASIX) 40 MG tablet Take 1 tablet (40 mg total) by mouth daily. 30 tablet 11  . gabapentin (NEURONTIN) 100 MG capsule Take 1 capsule (100 mg total) by mouth 3 (three) times daily. 90 capsule 3  . magnesium oxide (MAG-OX) 400 (241.3 Mg) MG tablet Take 1 tablet (400 mg total) by mouth daily. 30 tablet 0  . metFORMIN (GLUCOPHAGE-XR) 750 MG 24 hr tablet Take 750 mg by  mouth daily.  2  . Multiple Vitamins-Minerals (ICAPS AREDS 2 PO) Take 2 capsules by mouth daily.    . potassium chloride SA (KLOR-CON) 20 MEQ tablet Take 2 tablets (40 mEq total) by mouth daily. 30 tablet 0   Current Facility-Administered Medications  Medication Dose Route Frequency Provider Last Rate Last Admin  . betamethasone acetate-betamethasone sodium phosphate (CELESTONE) injection 3 mg  3 mg Intramuscular Once Edrick Kins, DPM        Allergies:   Patient has no known allergies.    Social History:  The patient  reports that she has never smoked. She has never used smokeless tobacco. She reports that she does not drink alcohol and does not use drugs.   Family  History:  The patient's family history includes Asthma in her brother; Diabetes in her father.    ROS:  Please see the history of present illness.   Otherwise, review of systems are positive for none.   All other systems are reviewed and negative.    PHYSICAL EXAM: VS:  There were no vitals taken for this visit. , BMI There is no height or weight on file to calculate BMI. Affect appropriate Frail elderly female  HEENT: normal Neck supple with no adenopathy JVP normal no bruits no thyromegaly Lungs clear with no wheezing and good diaphragmatic motion Heart:  S1/S2 no murmur, no rub, gallop or click PMI normal Abdomen: benighn, BS positve, no tenderness, no AAA no bruit.  No HSM or HJR Distal pulses intact with no bruits Lymphedema in both legs  Neuro non-focal Skin warm and dry No muscular weakness   EKG:  afib nonspecific ST changes 01/21/19 rate 65  Recent Labs: 10/31/2020: B Natriuretic Peptide 408.0 11/02/2020: ALT 12 11/05/2020: Hemoglobin 11.3; Platelets 177 12/12/2020: BUN 20; Creatinine, Ser 1.13; Magnesium 2.1; Potassium 4.1; Sodium 139    Lipid Panel No results found for: CHOL, TRIG, HDL, CHOLHDL, VLDL, LDLCALC, LDLDIRECT    Wt Readings from Last 3 Encounters:  12/12/20 75.3 kg  11/08/20 73.3 kg  11/05/20 79.6 kg      Other studies Reviewed: Additional studies/ records that were reviewed today include: notes from hospital labs, CXR, ECG TTE.   ASSESSMENT AND PLAN:  1.  Afib:  Rate control with cardizem and anticoagulation with eliquis Echo benign no indication for [facility] as she is asymptomatic  2.  DM:  Discussed low carb diet.  Target hemoglobin A1c is 6.5 or less.  Continue current medications. 3. HTN: Well controlled.  Continue current medications and low sodium Dash type diet.   4. Dementia:  Continue [place] f/u neurology .  5. GI/GU:  Recent urosepsis with gallstones fever Rx antibiotics  F/u Dr Criss Rosales needs UA rechecked  6. Pleural effusion: resolved  on CXR done 11/28/20 Continue Lasix daily BMET fine ? Diastolic dysfunction   Current medicines are reviewed at length with the patient today.  The patient does not have concerns regarding medicines.  The following changes have been made:  Meclizine 25 tid as needed for dizziness   Labs/ tests ordered today include:  None  No orders of the defined types were placed in this encounter. ***   Disposition:   FU with cardiology in 6 months     Signed, Jenkins Rouge, MD  12/17/2020 8:07 AM    [facility] [place], [place], [place]  [address] Phone: [phone]; Fax: [phone]

## 2020-12-24 DIAGNOSIS — B952 Enterococcus as the cause of diseases classified elsewhere: Secondary | ICD-10-CM | POA: Diagnosis not present

## 2020-12-24 DIAGNOSIS — I110 Hypertensive heart disease with heart failure: Secondary | ICD-10-CM | POA: Diagnosis not present

## 2020-12-24 DIAGNOSIS — N390 Urinary tract infection, site not specified: Secondary | ICD-10-CM | POA: Diagnosis not present

## 2020-12-24 DIAGNOSIS — R6520 Severe sepsis without septic shock: Secondary | ICD-10-CM | POA: Diagnosis not present

## 2020-12-24 DIAGNOSIS — E119 Type 2 diabetes mellitus without complications: Secondary | ICD-10-CM | POA: Diagnosis not present

## 2020-12-24 DIAGNOSIS — A419 Sepsis, unspecified organism: Secondary | ICD-10-CM | POA: Diagnosis not present

## 2020-12-24 DIAGNOSIS — B957 Other staphylococcus as the cause of diseases classified elsewhere: Secondary | ICD-10-CM | POA: Diagnosis not present

## 2020-12-24 DIAGNOSIS — I5033 Acute on chronic diastolic (congestive) heart failure: Secondary | ICD-10-CM | POA: Diagnosis not present

## 2020-12-24 DIAGNOSIS — J189 Pneumonia, unspecified organism: Secondary | ICD-10-CM | POA: Diagnosis not present

## 2020-12-26 ENCOUNTER — Ambulatory Visit: Payer: Medicare HMO | Admitting: Cardiovascular Disease

## 2021-01-18 ENCOUNTER — Emergency Department (HOSPITAL_COMMUNITY): Payer: Medicare HMO

## 2021-01-18 ENCOUNTER — Emergency Department (HOSPITAL_COMMUNITY)
Admission: EM | Admit: 2021-01-18 | Discharge: 2021-01-18 | Disposition: A | Discharge status: Home / Self Care | Payer: Medicare HMO | Attending: Emergency Medicine | Admitting: Emergency Medicine

## 2021-01-18 ENCOUNTER — Encounter (HOSPITAL_COMMUNITY): Payer: Self-pay

## 2021-01-18 DIAGNOSIS — N183 Chronic kidney disease, stage 3 unspecified: Secondary | ICD-10-CM | POA: Insufficient documentation

## 2021-01-18 DIAGNOSIS — Z8719 Personal history of other diseases of the digestive system: Secondary | ICD-10-CM | POA: Insufficient documentation

## 2021-01-18 DIAGNOSIS — R079 Chest pain, unspecified: Secondary | ICD-10-CM | POA: Insufficient documentation

## 2021-01-18 DIAGNOSIS — R0602 Shortness of breath: Secondary | ICD-10-CM | POA: Diagnosis not present

## 2021-01-18 DIAGNOSIS — R300 Dysuria: Secondary | ICD-10-CM | POA: Insufficient documentation

## 2021-01-18 DIAGNOSIS — I4891 Unspecified atrial fibrillation: Secondary | ICD-10-CM | POA: Diagnosis not present

## 2021-01-18 DIAGNOSIS — E1122 Type 2 diabetes mellitus with diabetic chronic kidney disease: Secondary | ICD-10-CM | POA: Insufficient documentation

## 2021-01-18 DIAGNOSIS — R1030 Lower abdominal pain, unspecified: Secondary | ICD-10-CM | POA: Diagnosis not present

## 2021-01-18 DIAGNOSIS — M6281 Muscle weakness (generalized): Secondary | ICD-10-CM | POA: Diagnosis not present

## 2021-01-18 DIAGNOSIS — I5032 Chronic diastolic (congestive) heart failure: Secondary | ICD-10-CM | POA: Diagnosis not present

## 2021-01-18 DIAGNOSIS — Z9089 Acquired absence of other organs: Secondary | ICD-10-CM | POA: Insufficient documentation

## 2021-01-18 DIAGNOSIS — I517 Cardiomegaly: Secondary | ICD-10-CM | POA: Diagnosis not present

## 2021-01-18 DIAGNOSIS — J90 Pleural effusion, not elsewhere classified: Secondary | ICD-10-CM | POA: Diagnosis not present

## 2021-01-18 DIAGNOSIS — R002 Palpitations: Secondary | ICD-10-CM | POA: Diagnosis not present

## 2021-01-18 DIAGNOSIS — Z8679 Personal history of other diseases of the circulatory system: Secondary | ICD-10-CM | POA: Diagnosis not present

## 2021-01-18 DIAGNOSIS — Z79899 Other long term (current) drug therapy: Secondary | ICD-10-CM | POA: Insufficient documentation

## 2021-01-18 DIAGNOSIS — R29810 Facial weakness: Secondary | ICD-10-CM | POA: Diagnosis not present

## 2021-01-18 DIAGNOSIS — R110 Nausea: Secondary | ICD-10-CM | POA: Diagnosis not present

## 2021-01-18 DIAGNOSIS — Z7984 Long term (current) use of oral hypoglycemic drugs: Secondary | ICD-10-CM | POA: Diagnosis not present

## 2021-01-18 DIAGNOSIS — R531 Weakness: Secondary | ICD-10-CM | POA: Diagnosis not present

## 2021-01-18 DIAGNOSIS — I130 Hypertensive heart and chronic kidney disease with heart failure and stage 1 through stage 4 chronic kidney disease, or unspecified chronic kidney disease: Secondary | ICD-10-CM | POA: Diagnosis not present

## 2021-01-18 DIAGNOSIS — R109 Unspecified abdominal pain: Secondary | ICD-10-CM | POA: Diagnosis not present

## 2021-01-18 LAB — URINALYSIS, ROUTINE W REFLEX MICROSCOPIC
Bilirubin Urine: NEGATIVE
Glucose, UA: NEGATIVE mg/dL
Hgb urine dipstick: NEGATIVE
Ketones, ur: NEGATIVE mg/dL
Leukocytes,Ua: NEGATIVE
Nitrite: NEGATIVE
Protein, ur: NEGATIVE mg/dL
Specific Gravity, Urine: 1.013 (ref 1.005–1.030)
pH: 6 (ref 5.0–8.0)

## 2021-01-18 LAB — CBC WITH DIFFERENTIAL/PLATELET
Abs Immature Granulocytes: 0.07 10*3/uL (ref 0.00–0.07)
Basophils Absolute: 0.1 10*3/uL (ref 0.0–0.1)
Basophils Relative: 1 %
Eosinophils Absolute: 0.1 10*3/uL (ref 0.0–0.5)
Eosinophils Relative: 1 %
HCT: 34.1 % — ABNORMAL LOW (ref 36.0–46.0)
Hemoglobin: 10.6 g/dL — ABNORMAL LOW (ref 12.0–15.0)
Immature Granulocytes: 1 %
Lymphocytes Relative: 21 %
Lymphs Abs: 2.2 10*3/uL (ref 0.7–4.0)
MCH: 29.3 pg (ref 26.0–34.0)
MCHC: 31.1 g/dL (ref 30.0–36.0)
MCV: 94.2 fL (ref 80.0–100.0)
Monocytes Absolute: 1 10*3/uL (ref 0.1–1.0)
Monocytes Relative: 10 %
Neutro Abs: 6.7 10*3/uL (ref 1.7–7.7)
Neutrophils Relative %: 66 %
Platelets: 252 10*3/uL (ref 150–400)
RBC: 3.62 MIL/uL — ABNORMAL LOW (ref 3.87–5.11)
RDW: 14.9 % (ref 11.5–15.5)
WBC: 10.1 10*3/uL (ref 4.0–10.5)
nRBC: 0 % (ref 0.0–0.2)

## 2021-01-18 LAB — BASIC METABOLIC PANEL
Anion gap: 10 (ref 5–15)
Anion gap: 8 (ref 5–15)
BUN: 12 mg/dL (ref 8–23)
BUN: 11 mg/dL (ref 8–23)
CO2: 23 mmol/L (ref 22–32)
CO2: 25 mmol/L (ref 22–32)
Calcium: 9.1 mg/dL (ref 8.9–10.3)
Calcium: 9 mg/dL (ref 8.9–10.3)
Chloride: 101 mmol/L (ref 98–111)
Chloride: 103 mmol/L (ref 98–111)
Creatinine, Ser: 1.05 mg/dL — ABNORMAL HIGH (ref 0.44–1.00)
Creatinine, Ser: 1.07 mg/dL — ABNORMAL HIGH (ref 0.44–1.00)
GFR, Estimated: 52 mL/min — ABNORMAL LOW (ref >60)
GFR, Estimated: 51 mL/min — ABNORMAL LOW (ref >60)
Glucose, Bld: 127 mg/dL — ABNORMAL HIGH (ref 70–99)
Glucose, Bld: 130 mg/dL — ABNORMAL HIGH (ref 70–99)
Potassium: 5 mmol/L (ref 3.5–5.1)
Potassium: 4.8 mmol/L (ref 3.5–5.1)
Sodium: 134 mmol/L — ABNORMAL LOW (ref 135–145)
Sodium: 136 mmol/L (ref 135–145)

## 2021-01-18 LAB — BRAIN NATRIURETIC PEPTIDE: B Natriuretic Peptide: 314.4 pg/mL — ABNORMAL HIGH (ref 0.0–100.0)

## 2021-01-18 LAB — TROPONIN I (HIGH SENSITIVITY): Troponin I (High Sensitivity): 5 ng/L (ref <18)

## 2021-01-18 MED ORDER — ONDANSETRON HCL 4 MG/2ML IJ SOLN
4.0000 mg | Freq: Once | INTRAMUSCULAR | Status: AC
  Administered 2021-01-18: 4 mg via INTRAVENOUS
  Filled 2021-01-18: qty 2

## 2021-01-18 MED ORDER — SODIUM CHLORIDE 0.9 % IV SOLN: INTRAVENOUS | Status: DC

## 2021-01-18 MED ORDER — ACETAMINOPHEN 325 MG PO TABS
650.0000 mg | ORAL_TABLET | Freq: Once | ORAL | Status: AC
  Administered 2021-01-18: 650 mg via ORAL
  Filled 2021-01-18: qty 2

## 2021-01-18 MED ORDER — GABAPENTIN 100 MG PO CAPS
100.0000 mg | ORAL_CAPSULE | Freq: Once | ORAL | Status: AC
  Administered 2021-01-18: 100 mg via ORAL
  Filled 2021-01-18: qty 1

## 2021-01-18 MED ORDER — SODIUM CHLORIDE 0.9 % IV BOLUS
1000.0000 mL | Freq: Once | INTRAVENOUS | Status: AC
  Administered 2021-01-18: 1000 mL via INTRAVENOUS

## 2021-01-18 MED ORDER — SODIUM CHLORIDE 0.9 % IV BOLUS
250.0000 mL | Freq: Once | INTRAVENOUS | Status: AC
  Administered 2021-01-18: 250 mL via INTRAVENOUS

## 2021-01-18 MED ORDER — IOHEXOL 300 MG/ML  SOLN
80.0000 mL | Freq: Once | INTRAMUSCULAR | Status: AC | PRN
  Administered 2021-01-18: 80 mL via INTRAVENOUS

## 2021-01-18 NOTE — ED Notes (Signed)
Got patient undressed into a gown on the monitor did ekg shown to provider patient is resting with call bell in reach

## 2021-01-18 NOTE — ED Triage Notes (Signed)
Pt arrived to ED via EMS from home. Pt c/o palpitations and feeling sob onset 0400. Pt took po cardizem at 0615 unknown dose. Also states generalized weakness for a few days. Pt was hx of afib, same on monitor rate of 70-90. Pt denies palpitation at this time or chest pain

## 2021-01-18 NOTE — Discharge Instructions (Signed)
You were seen in the emergency department for palpitations and some low abdominal pain.  You had blood work EKG chest x-ray urinalysis and a CAT scan of your abdomen pelvis that did not show any significant abnormalities.  Please contact your primary care doctor for close follow-up.  Return to the emergency department if any worsening or concerning symptoms

## 2021-01-18 NOTE — ED Provider Notes (Signed)
[facility] EMERGENCY DEPARTMENT Provider Note   CSN: [id number] Arrival date & time: 01/18/21  3790     History Chief Complaint  Patient presents with  . Palpitations    Anne Roach is a 87 y.o. female.  She is presenting from home by EMS.  Approximately 4 AM she was awakened with chest pain and palpitations.  Associated with some shortness of breath.  At around 6 AM she took some oral Cardizem and by the time she arrived here her symptoms had improved.  EMS also said that family was concerned for some generalized weakness and possible UTI.  Patient does endorse some low abdominal discomfort and some urinary burning.  Nausea but no vomiting.  No known fevers.  The history is provided by the patient and the EMS personnel.  Palpitations Palpitations quality:  Fast Onset quality:  Sudden Progression:  Resolved Chronicity:  Recurrent Relieved by: cardizem. Worsened by:  Nothing Ineffective treatments:  None tried Associated symptoms: chest pain, nausea and shortness of breath   Associated symptoms: no cough and no vomiting   Risk factors: hx of atrial fibrillation   Abdominal Pain Pain location:  Suprapubic Pain quality: pressure   Pain radiates to:  Does not radiate Pain severity:  Moderate Onset quality:  Gradual Timing:  Intermittent Progression:  Unchanged Chronicity:  New Context: not trauma   Relieved by:  None tried Worsened by:  Nothing Ineffective treatments:  None tried Associated symptoms: chest pain, nausea and shortness of breath   Associated symptoms: no cough, no dysuria, no fever, no sore throat and no vomiting        Past Medical History:  Diagnosis Date  . Atrial fibrillation ([place])   . Chronic diastolic CHF (congestive heart failure) ([place]) 10/31/2020  . DM (diabetes mellitus) ([place])   . HTN (hypertension)   . Tremor     Patient Active Problem List   Diagnosis Date Noted  . SIRS (systemic inflammatory response syndrome)  ([place]) 10/31/2020  . PNA (pneumonia) 10/31/2020  . Abdominal pain 10/31/2020  . GERD (gastroesophageal reflux disease) 10/31/2020  . Chronic diastolic CHF (congestive heart failure) ([place]) 10/31/2020  . Sepsis secondary to UTI ([place]) 07/11/2020  . Chronic atrial fibrillation ([place]) 07/11/2020  . Hypervolemia   . Campylobacter gastrointestinal tract infection 01/03/2019  . Chronic kidney disease (CKD), stage III (moderate) 01/02/2019  . AKI (acute kidney injury) ([place]) 01/02/2019  . Hypovolemia with active loss of fluid 01/02/2019  . Hypokalemia 01/02/2019  . Hypomagnesemia 01/02/2019  . Hypoalbuminemia 01/02/2019  . Bandemia without diagnosis of specific infection 01/02/2019  . Hypotension 01/02/2019  . Fatigue   . Sepsis ([place])   . Bacteria in urine 01/01/2019  . Atrial fibrillation with RVR ([place]) 03/20/2017  . Myogenic ptosis of bilateral eyelids 06/24/2016  . HTN (hypertension)   . Tremor   . Exudative age-related macular degeneration of right eye with active choroidal neovascularization ([place]) 08/29/2014  . Pseudophakia of both eyes 08/29/2014  . OSA (obstructive sleep apnea) 06/29/2012  . Type 2 diabetes mellitus ([place])   . Incidental lung nodule 03/10/2009  . Other diseases of lung, not elsewhere classified 03/10/2009    Past Surgical History:  Procedure Laterality Date  . APPENDECTOMY    . CARPAL TUNNEL RELEASE    . CATARACT EXTRACTION Bilateral   . HERNIA REPAIR       OB History   No obstetric history on file.     Family History  Problem Relation Age of Onset  .  Diabetes Father   . Asthma Brother     Social History   Tobacco Use  . Smoking status: Never Smoker  . Smokeless tobacco: Never Used  Substance Use Topics  . Alcohol use: No    Alcohol/week: 0.0 standard drinks  . Drug use: No    Home Medications Prior to Admission medications   Medication Sig Start Date End Date Taking? Authorizing Provider  ACCU-CHEK AVIVA PLUS test strip  10/06/20    [provider]  acetaminophen (TYLENOL) 500 MG tablet Take 1,000 mg by mouth every 6 (six) hours as needed for mild pain or headache.    [provider]  allopurinol (ZYLOPRIM) 100 MG tablet TAKE 1 TABLET(100 MG) BY MOUTH DAILY 11/23/20   Edrick Kins, DPM  cholecalciferol (VITAMIN D3) 25 MCG (1000 UNIT) tablet Take 1,000 Units by mouth daily.    [provider]  colchicine 0.6 MG tablet Take 1 tablet (0.6 mg total) by mouth daily. Patient taking differently: Take 0.6 mg by mouth daily as needed (for flare up). 10/17/20   Edrick Kins, DPM  diclofenac Sodium (VOLTAREN) 1 % GEL Apply 2 g topically 4 (four) times daily. 11/05/20   Regalado, Belkys A, MD  diltiazem (CARDIZEM CD) 240 MG 24 hr capsule TAKE 1 CAPSULE(240 MG) BY MOUTH DAILY 11/20/20   Josue Hector, MD  ELIQUIS 5 MG TABS tablet TAKE 1 TABLET(5 MG) BY MOUTH TWICE DAILY Patient taking differently: Take 5 mg by mouth 2 (two) times daily. 09/18/20   Josue Hector, MD  furosemide (LASIX) 40 MG tablet Take 1 tablet (40 mg total) by mouth daily. 11/05/20 11/05/21  Regalado, Belkys A, MD  gabapentin (NEURONTIN) 100 MG capsule Take 1 capsule (100 mg total) by mouth 3 (three) times daily. 10/17/20   Edrick Kins, DPM  magnesium oxide (MAG-OX) 400 (241.3 Mg) MG tablet Take 1 tablet (400 mg total) by mouth daily. 07/15/20   Geradine Girt, DO  metFORMIN (GLUCOPHAGE-XR) 750 MG 24 hr tablet Take 750 mg by mouth daily. 05/14/15   [provider]  Multiple Vitamins-Minerals (ICAPS AREDS 2 PO) Take 2 capsules by mouth daily.    [provider]  potassium chloride SA (KLOR-CON) 20 MEQ tablet Take 2 tablets (40 mEq total) by mouth daily. 12/17/20   Hunsucker, Bonna Gains, MD    Allergies    Patient has no known allergies.  Review of Systems   Review of Systems  Constitutional: Negative for fever.  HENT: Negative for sore throat.   Eyes: Negative for visual disturbance.  Respiratory: Positive for shortness  of breath. Negative for cough.   Cardiovascular: Positive for chest pain and palpitations.  Gastrointestinal: Positive for abdominal pain and nausea. Negative for vomiting.  Genitourinary: Negative for dysuria.  Musculoskeletal: Negative for joint swelling.  Skin: Negative for rash.  Neurological: Negative for headaches.    Physical Exam Updated Vital Signs BP 126/74   Pulse 90   Temp 98.3 F (36.8 C) (Oral)   Resp 20   SpO2 96%   Physical Exam Vitals and nursing note reviewed.  Constitutional:      General: She is not in acute distress.    Appearance: Normal appearance. She is well-developed and well-nourished.  HENT:     Head: Normocephalic and atraumatic.  Eyes:     Conjunctiva/sclera: Conjunctivae normal.  Cardiovascular:     Rate and Rhythm: Normal rate. Rhythm irregular.     Heart sounds: No murmur heard.   Pulmonary:  Effort: Pulmonary effort is normal. No respiratory distress.     Breath sounds: Normal breath sounds.  Abdominal:     Palpations: Abdomen is soft.     Tenderness: There is no abdominal tenderness.  Musculoskeletal:        General: No deformity, signs of injury or edema. Normal range of motion.     Cervical back: Neck supple.     Right lower leg: No edema.     Left lower leg: No edema.  Skin:    General: Skin is warm and dry.     Capillary Refill: Capillary refill takes less than 2 seconds.  Neurological:     General: No focal deficit present.     Mental Status: She is alert.  Psychiatric:        Mood and Affect: Mood and affect normal.     ED Results / Procedures / Treatments   Labs (all labs ordered are listed, but only abnormal results are displayed) Labs Reviewed  CBC WITH DIFFERENTIAL/PLATELET - Abnormal; Notable for the following components:      Result Value   RBC 3.62 (*)    Hemoglobin 10.6 (*)    HCT 34.1 (*)    All other components within normal limits  BASIC METABOLIC PANEL - Abnormal; Notable for the following  components:   Sodium 134 (*)    Glucose, Bld 127 (*)    Creatinine, Ser 1.05 (*)    GFR, Estimated 52 (*)    All other components within normal limits  BRAIN NATRIURETIC PEPTIDE - Abnormal; Notable for the following components:   B Natriuretic Peptide 314.4 (*)    All other components within normal limits  BASIC METABOLIC PANEL - Abnormal; Notable for the following components:   Glucose, Bld 130 (*)    Creatinine, Ser 1.07 (*)    GFR, Estimated 51 (*)    All other components within normal limits  URINE CULTURE  URINALYSIS, ROUTINE W REFLEX MICROSCOPIC  TROPONIN I (HIGH SENSITIVITY)    EKG EKG Interpretation  Date/Time:  Friday January 18 2021 08:04:36 EST Ventricular Rate:  74 PR Interval:    QRS Duration: 100 QT Interval:  385 QTC Calculation: 428 R Axis:   33 Text Interpretation: Atrial fibrillation Borderline low voltage, extremity leads Consider anterior infarct No significant change since prior 12/21 Confirmed by Aletta Edouard [phone]) on 01/18/2021 8:08:31 AM   Radiology CT Abdomen Pelvis W Contrast  Result Date: 01/18/2021 CLINICAL DATA:  Lower abdominal pain.  Diverticulitis suspected. EXAM: CT ABDOMEN AND PELVIS WITH CONTRAST TECHNIQUE: Multidetector CT imaging of the abdomen and pelvis was performed using the standard protocol following bolus administration of intravenous contrast. CONTRAST:  30mL OMNIPAQUE IOHEXOL 300 MG/ML  SOLN COMPARISON:  10/31/2020 FINDINGS: Lower chest: Mild compressive atelectasis in both lung bases. Moderate cardiomegaly. Right coronary artery calcification. Persistent small right and trace left pleural effusions. Hepatobiliary: Hepatomegaly versus a Riedel's lobe at 21.3 cm. No focal liver lesion. Normal gallbladder, without biliary ductal dilatation. Pancreas: Macrolobulated multi septated cystic lesion involving the pancreatic uncinate process of maximally 2.4 cm including on 40/3. Measured 2.1 cm on 03/30/2017 with similar morphology. No duct  dilatation or acute inflammation. Spleen: Normal in size, without focal abnormality. Adrenals/Urinary Tract: Normal adrenal glands. Mild renal cortical thinning bilaterally. Exophytic interpolar right renal 1.8 cm renal cyst or minimally complex cyst. Bilateral too small to characterize renal lesions. No hydronephrosis. Normal urinary bladder. Stomach/Bowel: Proximal gastric underdistention. Normal colon and terminal ileum. Normal small bowel. Vascular/Lymphatic: Aortic  atherosclerosis. No abdominopelvic adenopathy. Reproductive: Normal uterus and adnexa. Other: No significant free fluid.  Mild pelvic floor laxity. Musculoskeletal: Osteopenia.  Lumbosacral spondylosis. IMPRESSION: 1. No acute process in the abdomen or pelvis. No evidence of diverticulitis. 2. Macrolobulated multi septated cystic lesion within the pancreatic uncinate process. Pseudocyst versus indolent cystic neoplasm. Only mildly enlarged compared back to the 2018 exam. Given patient age and slow rate of growth, of doubtful clinical significance. If surveillance is desired, consider pancreatic protocol CT at 1 year. 3. Persistent small right and trace left pleural effusions. 4. Aortic Atherosclerosis (ICD10-I70.0). Electronically Signed   By: Abigail Miyamoto M.D.   On: 01/18/2021 13:34   DG Chest Port 1 View  Result Date: 01/18/2021 CLINICAL DATA:  Chest pain EXAM: PORTABLE CHEST 1 VIEW COMPARISON:  11/28/2020 FINDINGS: Cardiomegaly and vascular pedicle widening with small pleural effusions and vascular congestion. IMPRESSION: Cardiomegaly, vascular congestion, and small pleural effusions. Electronically Signed   By: Monte Fantasia M.D.   On: 01/18/2021 08:28    Procedures Procedures   Medications Ordered in ED Medications  0.9 %  sodium chloride infusion (75 mL/hr Intravenous Rate/Dose Change 01/18/21 1706)  ondansetron (ZOFRAN) injection 4 mg (4 mg Intravenous Given 01/18/21 1045)  gabapentin (NEURONTIN) capsule 100 mg (100 mg Oral Given  01/18/21 1152)  acetaminophen (TYLENOL) tablet 650 mg (650 mg Oral Given 01/18/21 1152)  iohexol (OMNIPAQUE) 300 MG/ML solution 80 mL (80 mLs Intravenous Contrast Given 01/18/21 1304)  sodium chloride 0.9 % bolus 1,000 mL (0 mLs Intravenous Stopped 01/18/21 1611)  sodium chloride 0.9 % bolus 250 mL (0 mLs Intravenous Stopped 01/18/21 1743)    ED Course  I have reviewed the triage vital signs and the nursing notes.  Pertinent labs & imaging results that were available during my care of the patient were reviewed by me and considered in my medical decision making (see chart for details).  Clinical Course as of 01/18/21 1802  Ludwig Clarks Jan 18, 2021  0808 Daughter is here now who confirms the history.  She said for the last 3 days she has had some suprapubic pain and when she gets a UTI she often will have palpitations and act like this. [MB]  [id number] Chest x-ray interpreted by me as no acute infiltrates, probable bilateral pleural effusions.  Awaiting radiology reading. [MB]  1309 BNP elevated but has been higher in the past. [MB]  1441 Patient is getting p.o. trial now. [MB]  1062 Cardiac echo 12/21 - IMPRESSIONS    1. Left ventricular ejection fraction, by estimation, is 50 to 55%. The  left ventricle has low normal function. The left ventricle has no regional  wall motion abnormalities. There is mild left ventricular hypertrophy.  Left ventricular diastolic function  could not be evaluated.  2. Right ventricular systolic function is moderately reduced. The right  ventricular size is normal. There is mildly elevated pulmonary artery  systolic pressure.  3. Left atrial size was severely dilated.  4. Right atrial size was mildly dilated.  5. A small pericardial effusion is present.  6. The mitral valve is normal in structure. Moderate mitral valve  regurgitation. No evidence of mitral stenosis. Moderate mitral annular  calcification.  7. Tricuspid valve regurgitation is moderate.  8. The aortic  valve is tricuspid. Aortic valve regurgitation is mild.  Mild aortic valve sclerosis is present, with no evidence of aortic valve  stenosis.  9. The inferior vena cava is dilated in size with >50% respiratory  variability, suggesting right atrial pressure  of 8 mmHg. [MB]    Clinical Course User Index [MB] Hayden Rasmussen, MD   MDM Rules/Calculators/A&P                         This patient complains of palpitations. suprapubic Pain, generalized weakness; this involves an extensive number of treatment Options and is a complaint that carries with it a high risk of complications and Morbidity. The differential includes UTI, dehydration, arrhythmia, ACS, anemia, metabolic derangement  I ordered, reviewed and interpreted labs, which included CBC with normal white count, hemoglobin slightly lower than baseline, chemistries fairly unremarkable, urinalysis without signs of infection, troponins flat I ordered medication IV fluids gabapentin oral Tylenol I ordered imaging studies which included chest x-ray and CT abdomen and pelvis with contrast and I independently    visualized and interpreted imaging which showed cardiomegaly a little bit of vascular congestion otherwise unremarkable Additional history obtained from patient's daughter and EMS Previous records obtained and reviewed in epic, no recent admissions  After the interventions stated above, I reevaluated the patient and found her blood pressures to be softening.  She said she feels a little dizzy.  Signed out to oncoming provider Dr. Bobby Rumpf to reassess after fluids and orthostatics.   Final Clinical Impression(s) / ED Diagnoses Final diagnoses:  Palpitations  Lower abdominal pain    Rx / DC Orders ED Discharge Orders    None       Hayden Rasmussen, MD 01/18/21 [phone]

## 2021-01-18 NOTE — ED Notes (Signed)
Placed patient on the bedpan patient is now off with family at bedside and call bell in reach  

## 2021-01-18 NOTE — ED Provider Notes (Signed)
Patient after the liter of fluid when she got up felt a little lightheaded.  Pressures were low normal.  She did not have any changes in orthostatic pressures however she was little symptomatic.  Patient given additional 250 cc of fluid and then continue to her at 75 cc an hour and now she feels much better.  We got her up she was asymptomatic patient stable for discharge home.   Fredia Sorrow, MD 01/18/21 [phone]

## 2021-01-18 NOTE — ED Notes (Signed)
Pt given crackers and juice

## 2021-01-19 LAB — URINE CULTURE: Culture: <10000 — AB

## 2021-01-23 ENCOUNTER — Telehealth: Payer: Self-pay | Admitting: Cardiovascular Disease

## 2021-01-23 DIAGNOSIS — R0602 Shortness of breath: Secondary | ICD-10-CM | POA: Diagnosis not present

## 2021-01-23 DIAGNOSIS — R42 Dizziness and giddiness: Secondary | ICD-10-CM | POA: Diagnosis not present

## 2021-01-23 DIAGNOSIS — I499 Cardiac arrhythmia, unspecified: Secondary | ICD-10-CM | POA: Diagnosis not present

## 2021-01-23 DIAGNOSIS — R000 Tachycardia, unspecified: Secondary | ICD-10-CM | POA: Diagnosis not present

## 2021-01-23 DIAGNOSIS — R002 Palpitations: Secondary | ICD-10-CM | POA: Diagnosis not present

## 2021-01-23 NOTE — Telephone Encounter (Signed)
    Pt's daughter calling, she said pt been having palpitations episodes and she is getting more often now. She said pt was seen in the ED but did not admit her. She wanted to get recommendations from Dr. Johnsie Cancel if he needs to change some of pt's medications

## 2021-01-23 NOTE — Telephone Encounter (Signed)
[facility] CLINIC INFORMATION: Your appointment is scheduled on: 01/29/21 at 8:30 am. Please arrive 15 minutes early for check-in. The [facility] is located in the [facility] at [facility]. Parking instructions/directions: [address] (off [address]). When you pull in to Entrance C, there is an underground parking garage to your right. The code to enter the garage is 1223. Take the elevators to the first floor. Follow the signs to the [facility]. You will see registration at the end of the hallway.  Phone number: [phone]   Patient's daughter aware of appointment and agreed to plan.

## 2021-01-23 NOTE — Telephone Encounter (Signed)
Called patient's daughter about her message. Patient has been having more palpitations for the last couple of weeks and went to the ED last Friday due to palpitations. Patient's daughter is wondering if there is anything patient can take for these episodes of palpitations. Patient is currently taking diltiazem 240 mg daily. Informed patient's daughter that she should not take any extra medications, only what is prescribed at this time. Will see if patient can be seen in [facility] clinic for evaluation and advisement.   Called [facility], got patient an appointment with them next Tuesday.  Tried to call patient back. Left message to call back.

## 2021-01-23 NOTE — Telephone Encounter (Signed)
Left message for patient to call back  

## 2021-01-24 ENCOUNTER — Other Ambulatory Visit: Payer: Self-pay | Admitting: Pulmonary Disease

## 2021-01-24 ENCOUNTER — Telehealth: Payer: Self-pay | Admitting: Cardiovascular Disease

## 2021-01-24 NOTE — Telephone Encounter (Signed)
Called home health nurse back. Informed her that patient should be evaluated and seen before medication changes can be made. Patient is scheduled at the earliest appointment available that works with her schedule, 01/29/21. Informed her that patient has chronic A. FIB and she is on a blood thinner that she needs to make sure patient takes. Home health nurse is not sure if patient is having more of anxiety that is causing her to have palpitations. Encouraged her to have the patient relax and try to take some deep breaths. She stated patient thinks she needs oxygen, which she stated patient's O2 was 92% on room air. Patient also reported that her HR was between 90 and 130 last night, and it usually last for about 2 hours. Will forward to providers covering for Dr. Johnsie Cancel for further advisement.

## 2021-01-24 NOTE — Telephone Encounter (Signed)
Called home health nurse back and informed her of the plan. Will call patient and let her know of Dr. Thompson Caul recommendations.  Called patient. Left message for patient to call back.

## 2021-01-24 NOTE — Telephone Encounter (Signed)
If this is happening daily schedule a 3-7 day monitor.  Have home health recheck O2 sat and /or have family purchase a device. Let us know if O2 sats are consistently < 93%

## 2021-01-24 NOTE — Telephone Encounter (Signed)
Anne Roach is calling for clarification for the patient in regards to her recent palpitations and what she should do in regards to them until her upcoming appointment. She is requesting to speak with Pam who spoke with the patient's daughter in regards to this yesterday. Please see 01/23/21 telephone note. A new telephone was made due to being unable to add to the note from yesterday. Please advise.

## 2021-01-28 NOTE — Progress Notes (Signed)
Primary Care Physician: Lucianne Lei, MD Primary Cardiologist: Dr Johnsie Cancel Primary Electrophysiologist: none Referring Physician: [facility] triage    Anne Roach is a 87 y.o. female with a history of HTN, DM, OSA, dementia, atrial fibrillation who presents for consultation in the [facility]. Patient is on Eliquis for a CHADS2VASC score of 5. Patient has been persistently in afib at least since 12/2018 as she was asymptomatic and rate controlled. Patient's daughter who accompanies her today provides most of the history. The patient has had more episodes of palpitations recently. She presented to the ED 01/18/21 with tachypalpitations and was in rate controlled afib. There does not appear to be any specific triggers.   Today, she denies symptoms of chest pain, shortness of breath, orthopnea, PND, lower extremity edema, dizziness, presyncope, syncope, bleeding, or neurologic sequela. The patient is tolerating medications without difficulties and is otherwise without complaint today.    Atrial Fibrillation Risk Factors:  she does have symptoms or diagnosis of sleep apnea. she is compliant with CPAP therapy.   she has a BMI of Body mass index is 27.19 kg/m.Marland Kitchen Filed Weights   01/29/21 0829  Weight: 74.1 kg    Family History  Problem Relation Age of Onset  . Diabetes Father   . Asthma Brother      Atrial Fibrillation Management history:  Previous antiarrhythmic drugs: none Previous cardioversions: none Previous ablations: none CHADS2VASC score: 5 Anticoagulation history: Eliquis   Past Medical History:  Diagnosis Date  . Atrial fibrillation ([place])   . Chronic diastolic CHF (congestive heart failure) ([place]) 10/31/2020  . DM (diabetes mellitus) ([place])   . HTN (hypertension)   . Tremor    Past Surgical History:  Procedure Laterality Date  . APPENDECTOMY    . CARPAL TUNNEL RELEASE    . CATARACT EXTRACTION Bilateral   . HERNIA REPAIR       Current Outpatient Medications  Medication Sig Dispense Refill  . ACCU-CHEK AVIVA PLUS test strip     . acetaminophen (TYLENOL) 500 MG tablet Take 1,000 mg by mouth every 6 (six) hours as needed for mild pain or headache.    . allopurinol (ZYLOPRIM) 100 MG tablet TAKE 1 TABLET(100 MG) BY MOUTH DAILY 90 tablet 2  . cholecalciferol (VITAMIN D3) 25 MCG (1000 UNIT) tablet Take 1,000 Units by mouth daily with breakfast.    . colchicine 0.6 MG tablet Take 1 tablet (0.6 mg total) by mouth daily. 20 tablet 2  . diclofenac Sodium (VOLTAREN) 1 % GEL Apply 2 g topically 4 (four) times daily. 150 g 0  . diltiazem (CARDIZEM CD) 240 MG 24 hr capsule TAKE 1 CAPSULE(240 MG) BY MOUTH DAILY 90 capsule 3  . ELIQUIS 5 MG TABS tablet TAKE 1 TABLET(5 MG) BY MOUTH TWICE DAILY 60 tablet 6  . furosemide (LASIX) 40 MG tablet Take 1 tablet (40 mg total) by mouth daily. 30 tablet 11  . gabapentin (NEURONTIN) 100 MG capsule Take 1 capsule (100 mg total) by mouth 3 (three) times daily. 90 capsule 3  . MAGNESIUM PO Take 1 tablet by mouth daily with lunch.    . metFORMIN (GLUCOPHAGE-XR) 750 MG 24 hr tablet Take 750 mg by mouth at bedtime.  2  . Multiple Vitamins-Minerals (ICAPS AREDS 2 PO) Take 1 capsule by mouth 2 (two) times daily.    . potassium chloride SA (KLOR-CON) 20 MEQ tablet TAKE 2 TABLETS(40 MEQ) BY MOUTH DAILY 30 tablet 0   Current Facility-Administered Medications  Medication  Dose Route Frequency Provider Last Rate Last Admin  . betamethasone acetate-betamethasone sodium phosphate (CELESTONE) injection 3 mg  3 mg Intramuscular Once Edrick Kins, DPM        No Known Allergies  Social History   Socioeconomic History  . Marital status: Widowed    Spouse name: Not on file  . Number of children: 3  . Years of education: 70  . Highest education level: Not on file  Occupational History    Comment: Retired  Tobacco Use  . Smoking status: Never Smoker  . Smokeless tobacco: Never Used  Substance and  Sexual Activity  . Alcohol use: No    Alcohol/week: 0.0 standard drinks  . Drug use: No  . Sexual activity: Not on file  Other Topics Concern  . Not on file  Social History Narrative   Speaks English and Arabic. Patient lives at home with her daughter Anne Roach). Patient is widowed.   Patient is retired.   Education high school.   Right handed.   Two children.   Caffeine None   Social Determinants of Health   Financial Resource Strain: Not on file  Food Insecurity: Not on file  Transportation Needs: Not on file  Physical Activity: Not on file  Stress: Not on file  Social Connections: Not on file  Intimate Partner Violence: Not on file     ROS- All systems are reviewed and negative except as per the HPI above.  Physical Exam: Vitals:   01/29/21 0829  BP: 122/82  Pulse: 78  Weight: 74.1 kg  Height: 5\' 5"  (1.651 m)    GEN- The patient is a well appearing elderly female, alert and oriented x 3 today.   Head- normocephalic, atraumatic Eyes-  Sclera clear, conjunctiva pink Ears- hearing intact Oropharynx- clear Neck- supple  Lungs- Clear to ausculation bilaterally, normal work of breathing Heart- irregular rate and rhythm, no murmurs, rubs or gallops  GI- soft, NT, ND, + BS Extremities- no clubbing, cyanosis, or edema MS- no significant deformity or atrophy Skin- no rash or lesion Psych- euthymic mood, full affect Neuro- strength and sensation are intact  Wt Readings from Last 3 Encounters:  01/29/21 74.1 kg  12/12/20 75.3 kg  11/08/20 73.3 kg    EKG today demonstrates  Afib  Vent. rate 78 BPM PR interval * ms QRS duration 88 ms QT/QTc 402/458 ms  Echo 11/01/20 demonstrated  1. Left ventricular ejection fraction, by estimation, is 50 to 55%. The  left ventricle has low normal function. The left ventricle has no regional  wall motion abnormalities. There is mild left ventricular hypertrophy.  Left ventricular diastolic function  could not be  evaluated.  2. Right ventricular systolic function is moderately reduced. The right  ventricular size is normal. There is mildly elevated pulmonary artery  systolic pressure.  3. Left atrial size was severely dilated.  4. Right atrial size was mildly dilated.  5. A small pericardial effusion is present.  6. The mitral valve is normal in structure. Moderate mitral valve  regurgitation. No evidence of mitral stenosis. Moderate mitral annular  calcification.  7. Tricuspid valve regurgitation is moderate.  8. The aortic valve is tricuspid. Aortic valve regurgitation is mild.  Mild aortic valve sclerosis is present, with no evidence of aortic valve  stenosis.  9. The inferior vena cava is dilated in size with >50% respiratory  variability, suggesting right atrial pressure of 8 mmHg.   Epic records are reviewed at length today  CHA2DS2-VASc Score =  5  The patient's score is based upon: CHF History: No HTN History: Yes Diabetes History: Yes Stroke History: No Vascular Disease History: No Age Score: 2 Gender Score: 1      ASSESSMENT AND PLAN: 1. Permanent Atrial Fibrillation (ICD10:  I48.11) The patient's CHA2DS2-VASc score is 5, indicating a 7.2% annual risk of stroke.   Patient rate controlled today but having symptoms of tachypalpitations.  Will order 7 day Zio to evaluate rate control vs another arrhythmia.  Continue Eliquis 5 mg BID Continue diltiazem 240 mg daily. If she is having RVR episodes, would up titrate dilt.   2. Secondary Hypercoagulable State (ICD10:  D68.69) The patient is at significant risk for stroke/thromboembolism based upon her CHA2DS2-VASc Score of 5.  Continue Apixaban (Eliquis).   3. Obstructive sleep apnea The importance of adequate treatment of sleep apnea was discussed today in order to improve our ability to maintain sinus rhythm long term. Encouraged compliance with CPAP.  4. HTN Stable, no changes today.   Follow up in the AF clinic  in 2-3 weeks when monitor results.    [facility] [address] [place], [place] [address] [phone] 01/29/2021 8:38 AM

## 2021-01-29 ENCOUNTER — Ambulatory Visit (HOSPITAL_COMMUNITY)
Admission: RE | Admit: 2021-01-29 | Discharge: 2021-01-29 | Disposition: A | Discharge status: Home / Self Care | Payer: Medicare HMO | Source: Ambulatory Visit | Attending: Physician Assistant | Admitting: Physician Assistant

## 2021-01-29 ENCOUNTER — Other Ambulatory Visit: Payer: Self-pay

## 2021-01-29 ENCOUNTER — Encounter (HOSPITAL_COMMUNITY): Payer: Self-pay | Admitting: Physician Assistant

## 2021-01-29 VITALS — BP 122/82 | HR 78 | Ht 65.0 in | Wt 163.4 lb

## 2021-01-29 DIAGNOSIS — Z79899 Other long term (current) drug therapy: Secondary | ICD-10-CM | POA: Diagnosis not present

## 2021-01-29 DIAGNOSIS — Z7984 Long term (current) use of oral hypoglycemic drugs: Secondary | ICD-10-CM | POA: Insufficient documentation

## 2021-01-29 DIAGNOSIS — R002 Palpitations: Secondary | ICD-10-CM | POA: Diagnosis not present

## 2021-01-29 DIAGNOSIS — I4821 Permanent atrial fibrillation: Secondary | ICD-10-CM | POA: Diagnosis not present

## 2021-01-29 DIAGNOSIS — E118 Type 2 diabetes mellitus with unspecified complications: Secondary | ICD-10-CM | POA: Insufficient documentation

## 2021-01-29 DIAGNOSIS — G4733 Obstructive sleep apnea (adult) (pediatric): Secondary | ICD-10-CM | POA: Insufficient documentation

## 2021-01-29 DIAGNOSIS — D6869 Other thrombophilia: Secondary | ICD-10-CM | POA: Insufficient documentation

## 2021-01-29 DIAGNOSIS — I10 Essential (primary) hypertension: Secondary | ICD-10-CM | POA: Insufficient documentation

## 2021-01-29 DIAGNOSIS — Z7901 Long term (current) use of anticoagulants: Secondary | ICD-10-CM | POA: Insufficient documentation

## 2021-01-30 DIAGNOSIS — H353211 Exudative age-related macular degeneration, right eye, with active choroidal neovascularization: Secondary | ICD-10-CM | POA: Diagnosis not present

## 2021-02-05 DIAGNOSIS — H353221 Exudative age-related macular degeneration, left eye, with active choroidal neovascularization: Secondary | ICD-10-CM | POA: Diagnosis not present

## 2021-02-07 ENCOUNTER — Ambulatory Visit: Payer: Medicare HMO | Admitting: Cardiovascular Disease

## 2021-02-09 DIAGNOSIS — R002 Palpitations: Secondary | ICD-10-CM | POA: Diagnosis not present

## 2021-02-12 DIAGNOSIS — E1121 Type 2 diabetes mellitus with diabetic nephropathy: Secondary | ICD-10-CM | POA: Diagnosis not present

## 2021-02-12 DIAGNOSIS — G629 Polyneuropathy, unspecified: Secondary | ICD-10-CM | POA: Diagnosis not present

## 2021-02-12 DIAGNOSIS — M109 Gout, unspecified: Secondary | ICD-10-CM | POA: Diagnosis not present

## 2021-02-12 DIAGNOSIS — I4891 Unspecified atrial fibrillation: Secondary | ICD-10-CM | POA: Diagnosis not present

## 2021-02-12 DIAGNOSIS — R002 Palpitations: Secondary | ICD-10-CM | POA: Diagnosis not present

## 2021-02-14 DIAGNOSIS — E1100 Type 2 diabetes mellitus with hyperosmolarity without nonketotic hyperglycemic-hyperosmolar coma (NKHHC): Secondary | ICD-10-CM | POA: Diagnosis not present

## 2021-02-14 DIAGNOSIS — I4811 Longstanding persistent atrial fibrillation: Secondary | ICD-10-CM | POA: Diagnosis not present

## 2021-02-14 DIAGNOSIS — I10 Essential (primary) hypertension: Secondary | ICD-10-CM | POA: Diagnosis not present

## 2021-02-18 NOTE — Progress Notes (Signed)
Primary Care Physician: Anne Lei, MD Primary Cardiologist: Dr Anne Roach Primary Electrophysiologist: none Referring Physician: [facility] triage    Anne Roach is a 87 y.o. female with a history of HTN, DM, OSA, dementia, atrial fibrillation who presents for follow up in the [facility]. Patient is on Eliquis for a CHADS2VASC score of 5. Patient has been persistently in afib at least since 12/2018 as she was asymptomatic and rate controlled. Patient's daughter who accompanies her provides most of the history. The patient has had more episodes of palpitations recently. She presented to the ED 01/18/21 with tachypalpitations and was in rate controlled afib. There does not appear to be any specific triggers.   On follow up today, patient reports she has done well with much fewer palpitations. Zio patch showed 100% afib burden with avg heart rate of 85 bpm. Her one symptomatic event showed a PVC. She denies any bleeding issues on anticoagulation.   Today, she denies symptoms of palpitations, chest pain, shortness of breath, orthopnea, PND, lower extremity edema, dizziness, presyncope, syncope, bleeding, or neurologic sequela. The patient is tolerating medications without difficulties and is otherwise without complaint today.    Atrial Fibrillation Risk Factors:  she does have symptoms or diagnosis of sleep apnea. she is compliant with CPAP therapy.   she has a BMI of Body mass index is 26.96 kg/m.Marland Kitchen Filed Weights   02/19/21 0832  Weight: 73.5 kg    Family History  Problem Relation Age of Onset  . Diabetes Father   . Asthma Brother      Atrial Fibrillation Management history:  Previous antiarrhythmic drugs: none Previous cardioversions: none Previous ablations: none CHADS2VASC score: 5 Anticoagulation history: Eliquis   Past Medical History:  Diagnosis Date  . Atrial fibrillation ([place])   . Chronic diastolic CHF (congestive heart failure) ([place])  10/31/2020  . DM (diabetes mellitus) ([place])   . HTN (hypertension)   . Tremor    Past Surgical History:  Procedure Laterality Date  . APPENDECTOMY    . CARPAL TUNNEL RELEASE    . CATARACT EXTRACTION Bilateral   . HERNIA REPAIR      Current Outpatient Medications  Medication Sig Dispense Refill  . ACCU-CHEK AVIVA PLUS test strip     . acetaminophen (TYLENOL) 500 MG tablet Take 1,000 mg by mouth every 6 (six) hours as needed for mild pain or headache.    . allopurinol (ZYLOPRIM) 100 MG tablet TAKE 1 TABLET(100 MG) BY MOUTH DAILY 90 tablet 2  . cholecalciferol (VITAMIN D3) 25 MCG (1000 UNIT) tablet Take 1,000 Units by mouth daily with breakfast.    . colchicine 0.6 MG tablet Take 1 tablet (0.6 mg total) by mouth daily. 20 tablet 2  . diclofenac Sodium (VOLTAREN) 1 % GEL Apply 2 g topically 4 (four) times daily. 150 g 0  . diltiazem (CARDIZEM CD) 240 MG 24 hr capsule TAKE 1 CAPSULE(240 MG) BY MOUTH DAILY 90 capsule 3  . diltiazem (CARDIZEM) 30 MG tablet Take 1 tablet every 4 hours AS NEEDED for heart rate >100 as long as blood pressure >100. 30 tablet 1  . ELIQUIS 5 MG TABS tablet TAKE 1 TABLET(5 MG) BY MOUTH TWICE DAILY 60 tablet 6  . furosemide (LASIX) 40 MG tablet Take 1 tablet (40 mg total) by mouth daily. 30 tablet 11  . gabapentin (NEURONTIN) 100 MG capsule Take 1 capsule (100 mg total) by mouth 3 (three) times daily. 90 capsule 3  . MAGNESIUM PO Take  1 tablet by mouth daily with lunch.    . metFORMIN (GLUCOPHAGE-XR) 750 MG 24 hr tablet Take 750 mg by mouth at bedtime.  2  . Multiple Vitamins-Minerals (ICAPS AREDS 2 PO) Take 1 capsule by mouth 2 (two) times daily.    . potassium chloride SA (KLOR-CON) 20 MEQ tablet TAKE 2 TABLETS(40 MEQ) BY MOUTH DAILY 30 tablet 0   Current Facility-Administered Medications  Medication Dose Route Frequency Provider Last Rate Last Admin  . betamethasone acetate-betamethasone sodium phosphate (CELESTONE) injection 3 mg  3 mg Intramuscular Once Anne Roach, DPM        No Known Allergies  Social History   Socioeconomic History  . Marital status: Widowed    Spouse name: Not on file  . Number of children: 3  . Years of education: 49  . Highest education level: Not on file  Occupational History    Comment: Retired  Tobacco Use  . Smoking status: Never Smoker  . Smokeless tobacco: Never Used  Substance and Sexual Activity  . Alcohol use: No    Alcohol/week: 0.0 standard drinks  . Drug use: No  . Sexual activity: Not on file  Other Topics Concern  . Not on file  Social History Narrative   Speaks English and Arabic. Patient lives at home with her daughter Anne Roach). Patient is widowed.   Patient is retired.   Education high school.   Right handed.   Two children.   Caffeine None   Social Determinants of Health   Financial Resource Strain: Not on file  Food Insecurity: Not on file  Transportation Needs: Not on file  Physical Activity: Not on file  Stress: Not on file  Social Connections: Not on file  Intimate Partner Violence: Not on file     ROS- All systems are reviewed and negative except as per the HPI above.  Physical Exam: Vitals:   02/19/21 0832  BP: 126/84  Pulse: 87  Weight: 73.5 kg  Height: 5\' 5"  (1.651 m)    GEN- The patient is a well appearing elderly female, alert and oriented x 3 today.   HEENT-head normocephalic, atraumatic, sclera clear, conjunctiva pink, hearing intact, trachea midline. Lungs- Clear to ausculation bilaterally, normal work of breathing Heart- irregular rate and rhythm, no murmurs, rubs or gallops  GI- soft, NT, ND, + BS Extremities- no clubbing, cyanosis, or edema MS- no significant deformity or atrophy Skin- no rash or lesion Psych- euthymic mood, full affect Neuro- strength and sensation are intact   Wt Readings from Last 3 Encounters:  02/19/21 73.5 kg  01/29/21 74.1 kg  12/12/20 75.3 kg    EKG today demonstrates  Afib, PVC Vent. rate 87 BPM PR interval  * ms QRS duration 88 ms QT/QTcB 398/478 ms  Echo 11/01/20 demonstrated  1. Left ventricular ejection fraction, by estimation, is 50 to 55%. The  left ventricle has low normal function. The left ventricle has no regional  wall motion abnormalities. There is mild left ventricular hypertrophy.  Left ventricular diastolic function  could not be evaluated.  2. Right ventricular systolic function is moderately reduced. The right  ventricular size is normal. There is mildly elevated pulmonary artery  systolic pressure.  3. Left atrial size was severely dilated.  4. Right atrial size was mildly dilated.  5. A small pericardial effusion is present.  6. The mitral valve is normal in structure. Moderate mitral valve  regurgitation. No evidence of mitral stenosis. Moderate mitral annular  calcification.  7.  Tricuspid valve regurgitation is moderate.  8. The aortic valve is tricuspid. Aortic valve regurgitation is mild.  Mild aortic valve sclerosis is present, with no evidence of aortic valve  stenosis.  9. The inferior vena cava is dilated in size with >50% respiratory  variability, suggesting right atrial pressure of 8 mmHg.   Epic records are reviewed at length today  CHA2DS2-VASc Score = 5  The patient's score is based upon: CHF History: No HTN History: Yes Diabetes History: Yes Stroke History: No Vascular Disease History: No Age Score: 2 Gender Score: 1      ASSESSMENT AND PLAN: 1. Permanent Atrial Fibrillation (ICD10:  I48.11) The patient's CHA2DS2-VASc score is 5, indicating a 7.2% annual risk of stroke.   Zio patch showed 100% afib burden with avg HR 85. Continue Eliquis 5 mg BID Continue diltiazem 240 mg daily. Will add 30 mg PRN for heart racing symptoms. If she takes this frequently, could increase daily diltiazem.   2. Secondary Hypercoagulable State (ICD10:  D68.69) The patient is at significant risk for stroke/thromboembolism based upon her CHA2DS2-VASc Score  of 5.  Continue Apixaban (Eliquis).   3. Obstructive sleep apnea Encouraged CPAP use.   4. HTN Stable, no changes today.   Follow up with Dr Anne Roach. AF clinic as needed.    [facility] [address] [place], [place] [address] [phone] 02/19/2021 8:53 AM

## 2021-02-19 ENCOUNTER — Other Ambulatory Visit: Payer: Self-pay

## 2021-02-19 ENCOUNTER — Ambulatory Visit (HOSPITAL_COMMUNITY)
Admission: RE | Admit: 2021-02-19 | Discharge: 2021-02-19 | Disposition: A | Discharge status: Home / Self Care | Payer: Medicare HMO | Source: Ambulatory Visit | Attending: Physician Assistant | Admitting: Physician Assistant

## 2021-02-19 ENCOUNTER — Encounter (HOSPITAL_COMMUNITY): Payer: Self-pay | Admitting: Physician Assistant

## 2021-02-19 VITALS — BP 126/84 | HR 87 | Ht 65.0 in | Wt 162.0 lb

## 2021-02-19 DIAGNOSIS — I4891 Unspecified atrial fibrillation: Secondary | ICD-10-CM | POA: Diagnosis present

## 2021-02-19 DIAGNOSIS — I493 Ventricular premature depolarization: Secondary | ICD-10-CM | POA: Diagnosis not present

## 2021-02-19 DIAGNOSIS — I4821 Permanent atrial fibrillation: Secondary | ICD-10-CM

## 2021-02-19 DIAGNOSIS — I083 Combined rheumatic disorders of mitral, aortic and tricuspid valves: Secondary | ICD-10-CM | POA: Diagnosis not present

## 2021-02-19 DIAGNOSIS — D6869 Other thrombophilia: Secondary | ICD-10-CM | POA: Diagnosis not present

## 2021-02-19 DIAGNOSIS — I4811 Longstanding persistent atrial fibrillation: Secondary | ICD-10-CM | POA: Diagnosis not present

## 2021-02-19 DIAGNOSIS — Z7901 Long term (current) use of anticoagulants: Secondary | ICD-10-CM | POA: Insufficient documentation

## 2021-02-19 DIAGNOSIS — I10 Essential (primary) hypertension: Secondary | ICD-10-CM | POA: Insufficient documentation

## 2021-02-19 DIAGNOSIS — G4733 Obstructive sleep apnea (adult) (pediatric): Secondary | ICD-10-CM | POA: Diagnosis not present

## 2021-02-19 MED ORDER — DILTIAZEM HCL 30 MG PO TABS: ORAL_TABLET | ORAL | 1 refills | Status: DC

## 2021-02-19 NOTE — Patient Instructions (Signed)
Cardizem 30mg  -- take 1 tablet every 4 hours AS NEEDED for heart rate >100 as long as top number of blood pressure >100.    Follow up with Dr. Johnsie Cancel

## 2021-03-16 DIAGNOSIS — I10 Essential (primary) hypertension: Secondary | ICD-10-CM | POA: Diagnosis not present

## 2021-03-16 DIAGNOSIS — E1100 Type 2 diabetes mellitus with hyperosmolarity without nonketotic hyperglycemic-hyperosmolar coma (NKHHC): Secondary | ICD-10-CM | POA: Diagnosis not present

## 2021-03-16 DIAGNOSIS — I4811 Longstanding persistent atrial fibrillation: Secondary | ICD-10-CM | POA: Diagnosis not present

## 2021-04-03 DIAGNOSIS — H353211 Exudative age-related macular degeneration, right eye, with active choroidal neovascularization: Secondary | ICD-10-CM | POA: Diagnosis not present

## 2021-04-10 DIAGNOSIS — H353221 Exudative age-related macular degeneration, left eye, with active choroidal neovascularization: Secondary | ICD-10-CM | POA: Diagnosis not present

## 2021-04-16 DIAGNOSIS — Z0000 Encounter for general adult medical examination without abnormal findings: Secondary | ICD-10-CM | POA: Diagnosis not present

## 2021-04-30 DIAGNOSIS — R609 Edema, unspecified: Secondary | ICD-10-CM | POA: Diagnosis not present

## 2021-04-30 DIAGNOSIS — E1121 Type 2 diabetes mellitus with diabetic nephropathy: Secondary | ICD-10-CM | POA: Diagnosis not present

## 2021-04-30 DIAGNOSIS — M109 Gout, unspecified: Secondary | ICD-10-CM | POA: Diagnosis not present

## 2021-04-30 DIAGNOSIS — G629 Polyneuropathy, unspecified: Secondary | ICD-10-CM | POA: Diagnosis not present

## 2021-04-30 DIAGNOSIS — I4891 Unspecified atrial fibrillation: Secondary | ICD-10-CM | POA: Diagnosis not present

## 2021-05-01 ENCOUNTER — Telehealth: Payer: Self-pay | Admitting: Cardiovascular Disease

## 2021-05-01 NOTE — Telephone Encounter (Signed)
Spoke with pt's daughter, DOD who reports bilateral pitting edema of the feet x 2 weeks with increased weight gain of 3 pounds in the last week.  Denies CP or SOB.  Pt is elevating feet in recliner when sitting and following a low sodium diet.  Her daughter has ordered a new pair of compression stockings but they have not yet arrived.  Pt is taking Furosemide 40mg  daily.  Daughter reports normal urine output.  Pt has seen PCP and was encouraged to contact cardiology.    Appointment has been scheduled with APP 05/10/2021. Advised to continue daily weights, elevating feet and legs, low Na+ diet and continue medications as prescribed.  Will forward to Dr Johnsie Cancel for review and recommendation.  Reviewed ED precautions.  Pt's daughter verbalizes understanding and agrees with current plan.

## 2021-05-01 NOTE — Telephone Encounter (Signed)
Pt c/o swelling: STAT is pt has developed SOB within 24 hours  If swelling, where is the swelling located? Feet   How much weight have you gained and in what time span? 3 lbs in a week   Have you gained 3 pounds in a day or 5 pounds in a week?   Do you have a log of your daily weights (if so, list)?   Are you currently taking a fluid pill? Yes   Are you currently SOB? no  Have you traveled recently?   Daughter is concerned about what is causing the swelling. The patient is on a fluid pill but it does not seem to be working.  Patient was scheduled to see EP APP because Dr. Johnsie Cancel was not available until October. Daughter is concerned about how long the patient will have to wait to be seen

## 2021-05-02 NOTE — Telephone Encounter (Signed)
Spoke with pt's daughter and advised per Dr Johnsie Cancel pt struggles with lymphedema and pt's PCP should manage her diuretics.  Pt's daughter verbalizes understanding and agrees with current plan.

## 2021-05-10 ENCOUNTER — Ambulatory Visit: Payer: Medicare HMO | Admitting: Student

## 2021-05-14 ENCOUNTER — Other Ambulatory Visit: Payer: Self-pay

## 2021-05-14 ENCOUNTER — Ambulatory Visit (INDEPENDENT_AMBULATORY_CARE_PROVIDER_SITE_OTHER): Payer: Medicare HMO | Admitting: Podiatry

## 2021-05-14 DIAGNOSIS — M7752 Other enthesopathy of left foot: Secondary | ICD-10-CM

## 2021-05-14 DIAGNOSIS — M7751 Other enthesopathy of right foot: Secondary | ICD-10-CM | POA: Diagnosis not present

## 2021-05-14 DIAGNOSIS — M722 Plantar fascial fibromatosis: Secondary | ICD-10-CM

## 2021-05-14 DIAGNOSIS — R600 Localized edema: Secondary | ICD-10-CM

## 2021-05-14 MED ORDER — BETAMETHASONE SOD PHOS & ACET 6 (3-3) MG/ML IJ SUSP
3.0000 mg | Freq: Once | INTRAMUSCULAR | Status: AC
  Administered 2021-05-14: 3 mg via INTRA_ARTICULAR

## 2021-05-14 NOTE — Progress Notes (Signed)
   Subjective:  87 y.o. female presenting today with her daughter for evaluation of chronic ankle pain to the bilateral feet and ankles.  Patient does have a history of recurrent gout attacks in the past.  Today she states that her ankles are painful bilateral.  She is also noticed increased swelling despite being on diuretics.  The swelling is localized to the ankles and feet only.  She denies a history of injury.  Currently she takes tramadol and gabapentin for the pain.  She presents for further treatment and evaluation   Past Medical History:  Diagnosis Date   Atrial fibrillation ([place])    Chronic diastolic CHF (congestive heart failure) ([place]) 10/31/2020   DM (diabetes mellitus) ([place])    HTN (hypertension)    Tremor      Objective / Physical Exam:  General:  The patient is alert and oriented x3 in no acute distress. Dermatology:  Skin is warm, dry and supple bilateral lower extremities. Negative for open lesions or macerations. Vascular:  Palpable pedal pulses bilaterally. No erythema noted.  Pitting edema noted to the bilateral feet and ankles.  The edema does not extend up into the leg or calf.  Capillary refill within normal limits. Neurological:  Epicritic and protective threshold grossly intact bilaterally.  Musculoskeletal Exam:  Pain on palpation to the anterior lateral medial aspects of the patient's bilateral ankles.  There is also pain on palpation noted to the bilateral plantar heels at the insertion of the plantar fascia onto the calcaneal tubercle.  Moderate edema noted. Range of motion within normal limits to all pedal and ankle joints bilateral. Muscle strength 5/5 in all groups bilateral.    Assessment: 1.  Capsulitis bilateral ankles with associated soft tissue edema 2.  Plantar fasciitis bilateral  Plan of Care:  1. Patient was evaluated. X-Rays reviewed.  2. Injection of 0.5 mL Celestone Soluspan injected in the patient's bilateral ankle joints and plantar  fascia bilateral.  3.  Compression ankle sleeve dispensed.  Wear daily 4.  Continue tramadol and gabapentin as per prescribing physician 5.  Return to clinic as needed  Edrick Kins, DPM [facility]  Dr. Edrick Kins, [address]                                        [place], [place] [address]                Office [phone]  Fax [phone]

## 2021-05-29 DIAGNOSIS — H353211 Exudative age-related macular degeneration, right eye, with active choroidal neovascularization: Secondary | ICD-10-CM | POA: Diagnosis not present

## 2021-06-05 DIAGNOSIS — H353221 Exudative age-related macular degeneration, left eye, with active choroidal neovascularization: Secondary | ICD-10-CM | POA: Diagnosis not present

## 2021-06-15 ENCOUNTER — Other Ambulatory Visit: Payer: Self-pay | Admitting: Cardiovascular Disease

## 2021-06-17 NOTE — Telephone Encounter (Signed)
Prescription refill request for Eliquis received. Indication: afib  Last office visit: [place], 02/19/2021 Scr: 1.07, 01/18/2021 Age: 87 yo  Weight: 73.5 kg   Pt is on the correct dose of Eliquis per dosing criteria, prescription refill sent for Eliquis '5mg'$  BID.

## 2021-06-18 DIAGNOSIS — G629 Polyneuropathy, unspecified: Secondary | ICD-10-CM | POA: Diagnosis not present

## 2021-06-18 DIAGNOSIS — R531 Weakness: Secondary | ICD-10-CM | POA: Diagnosis not present

## 2021-06-18 DIAGNOSIS — M109 Gout, unspecified: Secondary | ICD-10-CM | POA: Diagnosis not present

## 2021-06-18 DIAGNOSIS — E1121 Type 2 diabetes mellitus with diabetic nephropathy: Secondary | ICD-10-CM | POA: Diagnosis not present

## 2021-06-18 DIAGNOSIS — I4891 Unspecified atrial fibrillation: Secondary | ICD-10-CM | POA: Diagnosis not present

## 2021-07-17 DIAGNOSIS — I4811 Longstanding persistent atrial fibrillation: Secondary | ICD-10-CM | POA: Diagnosis not present

## 2021-07-17 DIAGNOSIS — E1100 Type 2 diabetes mellitus with hyperosmolarity without nonketotic hyperglycemic-hyperosmolar coma (NKHHC): Secondary | ICD-10-CM | POA: Diagnosis not present

## 2021-07-17 DIAGNOSIS — I10 Essential (primary) hypertension: Secondary | ICD-10-CM | POA: Diagnosis not present

## 2021-07-31 DIAGNOSIS — H353211 Exudative age-related macular degeneration, right eye, with active choroidal neovascularization: Secondary | ICD-10-CM | POA: Diagnosis not present

## 2021-08-07 DIAGNOSIS — H353221 Exudative age-related macular degeneration, left eye, with active choroidal neovascularization: Secondary | ICD-10-CM | POA: Diagnosis not present

## 2021-08-08 ENCOUNTER — Other Ambulatory Visit: Payer: Self-pay | Admitting: Podiatry

## 2021-08-16 DIAGNOSIS — I10 Essential (primary) hypertension: Secondary | ICD-10-CM | POA: Diagnosis not present

## 2021-08-16 DIAGNOSIS — I4811 Longstanding persistent atrial fibrillation: Secondary | ICD-10-CM | POA: Diagnosis not present

## 2021-08-16 DIAGNOSIS — E1100 Type 2 diabetes mellitus with hyperosmolarity without nonketotic hyperglycemic-hyperosmolar coma (NKHHC): Secondary | ICD-10-CM | POA: Diagnosis not present

## 2021-09-18 DIAGNOSIS — H353211 Exudative age-related macular degeneration, right eye, with active choroidal neovascularization: Secondary | ICD-10-CM | POA: Diagnosis not present

## 2021-10-02 DIAGNOSIS — H353221 Exudative age-related macular degeneration, left eye, with active choroidal neovascularization: Secondary | ICD-10-CM | POA: Diagnosis not present

## 2021-10-03 DIAGNOSIS — G629 Polyneuropathy, unspecified: Secondary | ICD-10-CM | POA: Diagnosis not present

## 2021-10-03 DIAGNOSIS — I4891 Unspecified atrial fibrillation: Secondary | ICD-10-CM | POA: Diagnosis not present

## 2021-10-03 DIAGNOSIS — M25569 Pain in unspecified knee: Secondary | ICD-10-CM | POA: Diagnosis not present

## 2021-10-03 DIAGNOSIS — M109 Gout, unspecified: Secondary | ICD-10-CM | POA: Diagnosis not present

## 2021-10-03 DIAGNOSIS — M5459 Other low back pain: Secondary | ICD-10-CM | POA: Diagnosis not present

## 2021-10-03 DIAGNOSIS — E1121 Type 2 diabetes mellitus with diabetic nephropathy: Secondary | ICD-10-CM | POA: Diagnosis not present

## 2021-10-22 ENCOUNTER — Other Ambulatory Visit: Payer: Self-pay | Admitting: Cardiovascular Disease

## 2021-11-06 DIAGNOSIS — H353211 Exudative age-related macular degeneration, right eye, with active choroidal neovascularization: Secondary | ICD-10-CM | POA: Diagnosis not present

## 2021-11-19 ENCOUNTER — Other Ambulatory Visit: Payer: Self-pay | Admitting: Cardiovascular Disease

## 2021-11-27 DIAGNOSIS — H353221 Exudative age-related macular degeneration, left eye, with active choroidal neovascularization: Secondary | ICD-10-CM | POA: Diagnosis not present

## 2021-12-25 DIAGNOSIS — H353211 Exudative age-related macular degeneration, right eye, with active choroidal neovascularization: Secondary | ICD-10-CM | POA: Diagnosis not present

## 2022-01-18 ENCOUNTER — Other Ambulatory Visit: Payer: Self-pay | Admitting: Cardiovascular Disease

## 2022-01-18 DIAGNOSIS — I4891 Unspecified atrial fibrillation: Secondary | ICD-10-CM

## 2022-01-20 ENCOUNTER — Telehealth: Payer: Self-pay | Admitting: *Deleted

## 2022-01-20 MED ORDER — DILTIAZEM HCL ER COATED BEADS 240 MG PO CP24: ORAL_CAPSULE | ORAL | 0 refills | Status: DC

## 2022-01-20 NOTE — Telephone Encounter (Signed)
Pt's medication was sent to pt's pharmacy as requested. Confirmation received.  °

## 2022-01-20 NOTE — Telephone Encounter (Signed)
Prescription refill request for Eliquis received. ?Indication: afib  ?Last office visit: 02/19/2021, [place] ?Scr: 1.07, 01/18/2021 ?Age: 88 yo  ?Weight: 73.5 kg  ? ?Pt is overdue for blood work.  ?

## 2022-01-20 NOTE — Telephone Encounter (Signed)
Called pt is coming in on 3/7 for blood work. Pt has a 1 week supply of eliquis left.  ?

## 2022-01-20 NOTE — Telephone Encounter (Addendum)
Pt called and stated that she is needing a refill of diltiazem sent to [facility]. Diltiazem 240 mg daily.  Called and spoke to pt and made her aware that she will need to schedule a follow up appointment with Dr. Johnsie Cancel. Transferred pt to the main line to make appointment.  ?

## 2022-01-21 ENCOUNTER — Other Ambulatory Visit: Payer: Self-pay

## 2022-01-21 ENCOUNTER — Other Ambulatory Visit: Payer: Medicare HMO | Admitting: *Deleted

## 2022-01-21 DIAGNOSIS — I4891 Unspecified atrial fibrillation: Secondary | ICD-10-CM

## 2022-01-21 LAB — CBC
Hematocrit: 29 % — ABNORMAL LOW (ref 34.0–46.6)
Hemoglobin: 9.7 g/dL — ABNORMAL LOW (ref 11.1–15.9)
MCH: 28.6 pg (ref 26.6–33.0)
MCHC: 33.4 g/dL (ref 31.5–35.7)
MCV: 86 fL (ref 79–97)
Platelets: 238 10*3/uL (ref 150–450)
RBC: 3.39 x10E6/uL — ABNORMAL LOW (ref 3.77–5.28)
RDW: 13.9 % (ref 11.7–15.4)
WBC: 7 10*3/uL (ref 3.4–10.8)

## 2022-01-21 LAB — BASIC METABOLIC PANEL
BUN/Creatinine Ratio: 16 (ref 12–28)
BUN: 18 mg/dL (ref 8–27)
CO2: 25 mmol/L (ref 20–29)
Calcium: 9 mg/dL (ref 8.7–10.3)
Chloride: 101 mmol/L (ref 96–106)
Creatinine, Ser: 1.1 mg/dL — ABNORMAL HIGH (ref 0.57–1.00)
Glucose: 108 mg/dL — ABNORMAL HIGH (ref 70–99)
Potassium: 4.8 mmol/L (ref 3.5–5.2)
Sodium: 141 mmol/L (ref 134–144)
eGFR: 49 mL/min/{1.73_m2} — ABNORMAL LOW (ref >59)

## 2022-01-21 NOTE — Telephone Encounter (Signed)
This encounter was created in error - please disregard.

## 2022-01-22 DIAGNOSIS — H353221 Exudative age-related macular degeneration, left eye, with active choroidal neovascularization: Secondary | ICD-10-CM | POA: Diagnosis not present

## 2022-01-22 NOTE — Telephone Encounter (Signed)
Prescription refill request for Eliquis received. ?Indication: Afib  ?Last office visit: 02/19/21 [name] - Pt has scheduled appt with Dr Johnsie Cancel on 05/01/22  ?Scr: 1.10 (01/21/22) ?Age: 88 ?Weight: 73.5kg ? ?Appropriate dose and refill sent to requested pharmacy.  ? ? ?

## 2022-01-23 DIAGNOSIS — M109 Gout, unspecified: Secondary | ICD-10-CM | POA: Diagnosis not present

## 2022-01-23 DIAGNOSIS — G629 Polyneuropathy, unspecified: Secondary | ICD-10-CM | POA: Diagnosis not present

## 2022-01-23 DIAGNOSIS — I4891 Unspecified atrial fibrillation: Secondary | ICD-10-CM | POA: Diagnosis not present

## 2022-01-23 DIAGNOSIS — R531 Weakness: Secondary | ICD-10-CM | POA: Diagnosis not present

## 2022-01-23 DIAGNOSIS — E1121 Type 2 diabetes mellitus with diabetic nephropathy: Secondary | ICD-10-CM | POA: Diagnosis not present

## 2022-01-23 DIAGNOSIS — M2550 Pain in unspecified joint: Secondary | ICD-10-CM | POA: Diagnosis not present

## 2022-02-03 ENCOUNTER — Other Ambulatory Visit: Payer: Self-pay

## 2022-02-03 MED ORDER — DILTIAZEM HCL ER COATED BEADS 240 MG PO CP24: 240.0000 mg | ORAL_CAPSULE | Freq: Every day | ORAL | 0 refills | Status: DC

## 2022-02-05 DIAGNOSIS — H353231 Exudative age-related macular degeneration, bilateral, with active choroidal neovascularization: Secondary | ICD-10-CM | POA: Diagnosis not present

## 2022-02-05 DIAGNOSIS — H26493 Other secondary cataract, bilateral: Secondary | ICD-10-CM | POA: Diagnosis not present

## 2022-02-05 DIAGNOSIS — Z961 Presence of intraocular lens: Secondary | ICD-10-CM | POA: Diagnosis not present

## 2022-02-05 DIAGNOSIS — E119 Type 2 diabetes mellitus without complications: Secondary | ICD-10-CM | POA: Diagnosis not present

## 2022-02-19 DIAGNOSIS — H353211 Exudative age-related macular degeneration, right eye, with active choroidal neovascularization: Secondary | ICD-10-CM | POA: Diagnosis not present

## 2022-03-17 ENCOUNTER — Emergency Department (HOSPITAL_COMMUNITY)
Admission: EM | Admit: 2022-03-17 | Discharge: 2022-03-17 | Disposition: A | Discharge status: Home / Self Care | Payer: Medicare HMO | Attending: Emergency Medicine | Admitting: Emergency Medicine

## 2022-03-17 ENCOUNTER — Emergency Department (HOSPITAL_COMMUNITY): Payer: Medicare HMO

## 2022-03-17 DIAGNOSIS — Z7901 Long term (current) use of anticoagulants: Secondary | ICD-10-CM | POA: Insufficient documentation

## 2022-03-17 DIAGNOSIS — D649 Anemia, unspecified: Secondary | ICD-10-CM | POA: Diagnosis not present

## 2022-03-17 DIAGNOSIS — I4891 Unspecified atrial fibrillation: Secondary | ICD-10-CM | POA: Diagnosis not present

## 2022-03-17 DIAGNOSIS — Z79899 Other long term (current) drug therapy: Secondary | ICD-10-CM | POA: Insufficient documentation

## 2022-03-17 DIAGNOSIS — Z7984 Long term (current) use of oral hypoglycemic drugs: Secondary | ICD-10-CM | POA: Diagnosis not present

## 2022-03-17 DIAGNOSIS — R7989 Other specified abnormal findings of blood chemistry: Secondary | ICD-10-CM | POA: Diagnosis not present

## 2022-03-17 DIAGNOSIS — R0602 Shortness of breath: Secondary | ICD-10-CM

## 2022-03-17 DIAGNOSIS — R531 Weakness: Secondary | ICD-10-CM | POA: Diagnosis not present

## 2022-03-17 DIAGNOSIS — R001 Bradycardia, unspecified: Secondary | ICD-10-CM | POA: Diagnosis not present

## 2022-03-17 DIAGNOSIS — J90 Pleural effusion, not elsewhere classified: Secondary | ICD-10-CM | POA: Diagnosis not present

## 2022-03-17 DIAGNOSIS — I10 Essential (primary) hypertension: Secondary | ICD-10-CM | POA: Diagnosis not present

## 2022-03-17 DIAGNOSIS — I517 Cardiomegaly: Secondary | ICD-10-CM | POA: Diagnosis not present

## 2022-03-17 DIAGNOSIS — R0902 Hypoxemia: Secondary | ICD-10-CM | POA: Diagnosis not present

## 2022-03-17 LAB — CBC WITH DIFFERENTIAL/PLATELET
Abs Immature Granulocytes: 0.04 10*3/uL (ref 0.00–0.07)
Basophils Absolute: 0.1 10*3/uL (ref 0.0–0.1)
Basophils Relative: 1 %
Eosinophils Absolute: 0.2 10*3/uL (ref 0.0–0.5)
Eosinophils Relative: 3 %
HCT: 29.9 % — ABNORMAL LOW (ref 36.0–46.0)
Hemoglobin: 8.8 g/dL — ABNORMAL LOW (ref 12.0–15.0)
Immature Granulocytes: 1 %
Lymphocytes Relative: 31 %
Lymphs Abs: 2.5 10*3/uL (ref 0.7–4.0)
MCH: 26.6 pg (ref 26.0–34.0)
MCHC: 29.4 g/dL — ABNORMAL LOW (ref 30.0–36.0)
MCV: 90.3 fL (ref 80.0–100.0)
Monocytes Absolute: 0.9 10*3/uL (ref 0.1–1.0)
Monocytes Relative: 11 %
Neutro Abs: 4.4 10*3/uL (ref 1.7–7.7)
Neutrophils Relative %: 53 %
Platelets: 241 10*3/uL (ref 150–400)
RBC: 3.31 MIL/uL — ABNORMAL LOW (ref 3.87–5.11)
RDW: 16.9 % — ABNORMAL HIGH (ref 11.5–15.5)
WBC: 8.1 10*3/uL (ref 4.0–10.5)
nRBC: 0 % (ref 0.0–0.2)

## 2022-03-17 LAB — COMPREHENSIVE METABOLIC PANEL
ALT: 12 U/L (ref 0–44)
AST: 17 U/L (ref 15–41)
Albumin: 3.1 g/dL — ABNORMAL LOW (ref 3.5–5.0)
Alkaline Phosphatase: 62 U/L (ref 38–126)
Anion gap: 8 (ref 5–15)
BUN: 13 mg/dL (ref 8–23)
CO2: 24 mmol/L (ref 22–32)
Calcium: 8.6 mg/dL — ABNORMAL LOW (ref 8.9–10.3)
Chloride: 107 mmol/L (ref 98–111)
Creatinine, Ser: 1.04 mg/dL — ABNORMAL HIGH (ref 0.44–1.00)
GFR, Estimated: 52 mL/min — ABNORMAL LOW (ref >60)
Glucose, Bld: 105 mg/dL — ABNORMAL HIGH (ref 70–99)
Potassium: 4.4 mmol/L (ref 3.5–5.1)
Sodium: 139 mmol/L (ref 135–145)
Total Bilirubin: 0.6 mg/dL (ref 0.3–1.2)
Total Protein: 6.7 g/dL (ref 6.5–8.1)

## 2022-03-17 LAB — TROPONIN I (HIGH SENSITIVITY)
Troponin I (High Sensitivity): 7 ng/L (ref <18)
Troponin I (High Sensitivity): 7 ng/L (ref <18)

## 2022-03-17 LAB — BRAIN NATRIURETIC PEPTIDE: B Natriuretic Peptide: 399 pg/mL — ABNORMAL HIGH (ref 0.0–100.0)

## 2022-03-17 MED ORDER — FERROUS SULFATE 325 (65 FE) MG PO TABS: 325.0000 mg | ORAL_TABLET | Freq: Every day | ORAL | 0 refills | Status: DC

## 2022-03-17 MED ORDER — ACETAMINOPHEN 325 MG PO TABS
650.0000 mg | ORAL_TABLET | Freq: Once | ORAL | Status: AC
Start: 2022-03-17 — End: 2022-03-17
  Administered 2022-03-17: 650 mg via ORAL
  Filled 2022-03-17: qty 2

## 2022-03-17 NOTE — Discharge Instructions (Addendum)
Recommend following up with your primary care doctor.  Call their office tomorrow to request appointment within the next few days.  Please have them recheck your hemoglobin levels to monitor your anemia as well as recheck a x-ray of your lungs to monitor for any sort of fluid on your lungs. ? ?Please also follow-up with your heart doctor. ? ?If you develop any increasing difficulty in breathing, chest pain, or other new concerning symptom, please come back to ER for reassessment. ?

## 2022-03-17 NOTE — ED Triage Notes (Signed)
Pt BIB [facility] from home d/t SOB and lethargy that started this morning. Per EMS, pt's sat was at 86% RA upon Fire department arrival. Pt doesn't complain SOB. A&OX4. VSS ?

## 2022-03-17 NOTE — ED Provider Notes (Signed)
?Anne Roach ?Provider Note ? ? ?CSN: [id number] ?Arrival date & time: 03/17/22  Vernelle Emerald ? ?  ? ?History ? ?Chief Complaint  ?Patient presents with  ? Shortness of Breath  ? ? ?Anne Roach is a 88 y.o. female.  Presented to ER due to concern for shortness of breath.  Patient reports that around 6 AM she felt short of breath upon waking up.  States that the shortness of breath was relatively constant throughout the day.  She called her daughter around 5 PM, EMS was then called.  They report that her oxygen level was 68% on room air and she was placed on nasal cannula.  Patient states that her breathing felt improved after starting the oxygen.  She denied any associated chest pain, no alleviating or aggravating factors otherwise.  States that she does not have any ongoing complaint at present.  States that she has not had any major medication changes.  Still takes her Eliquis and Cardizem.  Endorses compliance with her medications. ? ?Reviewed chart, reviewed last cardiology note.  Patient has extensive past medical history including history of atrial fibrillation and is managed on Eliquis and Cardizem. ? ?HPI ? ?  ? ?Home Medications ?Prior to Admission medications   ?Medication Sig Start Date End Date Taking? Authorizing Provider  ?ferrous sulfate 325 (65 FE) MG tablet Take 1 tablet (325 mg total) by mouth daily. 03/17/22  Yes Lucrezia Starch, MD  ?ACCU-CHEK AVIVA PLUS test strip  10/06/20   [provider]  ?acetaminophen (TYLENOL) 500 MG tablet Take 1,000 mg by mouth every 6 (six) hours as needed for mild pain or headache.    [provider]  ?allopurinol (ZYLOPRIM) 100 MG tablet TAKE 1 TABLET(100 MG) BY MOUTH DAILY 08/09/21   Edrick Kins, DPM  ?apixaban (ELIQUIS) 5 MG TABS tablet TAKE 1 TABLET(5 MG) BY MOUTH TWICE DAILY 01/22/22   Josue Hector, MD  ?cholecalciferol (VITAMIN D3) 25 MCG (1000 UNIT) tablet Take 1,000 Units by mouth daily with breakfast.     [provider]  ?colchicine 0.6 MG tablet Take 1 tablet (0.6 mg total) by mouth daily. 10/17/20   Edrick Kins, DPM  ?diclofenac Sodium (VOLTAREN) 1 % GEL Apply 2 g topically 4 (four) times daily. 11/05/20   Regalado, Belkys A, MD  ?diltiazem (CARDIZEM CD) 240 MG 24 hr capsule Take 1 capsule (240 mg total) by mouth daily. TAKE 1 CAPSULE(240 MG) BY MOUTH DAILY. PLEASE MAKE OVERDUE APPOINTMENT WITH DOCTOR [place] ANYMORE REFILLS. THANK YOU 3RD AND FINAL ATTEMPT 02/03/22   Josue Hector, MD  ?diltiazem (CARDIZEM) 30 MG tablet Take 1 tablet every 4 hours AS NEEDED for heart rate >100 as long as blood pressure >100. 02/19/21   Fenton, Clint R, PA  ?furosemide (LASIX) 40 MG tablet Take 1 tablet (40 mg total) by mouth daily. 11/05/20 11/05/21  Regalado, Jerald Kief A, MD  ?gabapentin (NEURONTIN) 100 MG capsule Take 1 capsule (100 mg total) by mouth 3 (three) times daily. 10/17/20   Edrick Kins, DPM  ?MAGNESIUM PO Take 1 tablet by mouth daily with lunch.    [provider]  ?metFORMIN (GLUCOPHAGE-XR) 750 MG 24 hr tablet Take 750 mg by mouth at bedtime. 05/14/15   [provider]  ?Multiple Vitamins-Minerals (ICAPS AREDS 2 PO) Take 1 capsule by mouth 2 (two) times daily.    [provider]  ?potassium chloride SA (KLOR-CON) 20 MEQ tablet TAKE 2 TABLETS(40 MEQ) BY  MOUTH DAILY 01/24/21   Hunsucker, Bonna Gains, MD  ?   ? ?Allergies    ?Patient has no known allergies.   ? ?Review of Systems   ?Review of Systems  ?Constitutional:  Negative for chills and fever.  ?HENT:  Negative for ear pain and sore throat.   ?Eyes:  Negative for pain and visual disturbance.  ?Respiratory:  Positive for shortness of breath. Negative for cough.   ?Cardiovascular:  Negative for chest pain and palpitations.  ?Gastrointestinal:  Negative for abdominal pain and vomiting.  ?Genitourinary:  Negative for dysuria and hematuria.  ?Musculoskeletal:  Negative for arthralgias and back pain.  ?Skin:  Negative for color  change and rash.  ?Neurological:  Negative for seizures and syncope.  ?All other systems reviewed and are negative. ? ?Physical Exam ?Updated Vital Signs ?BP (!) 149/94   Pulse 91   Temp 98.2 ?F (36.8 ?C) (Oral)   Resp (!) 22   SpO2 99%  ?Physical Exam ?Vitals and nursing note reviewed.  ?Constitutional:   ?   General: She is not in acute distress. ?   Appearance: She is well-developed.  ?HENT:  ?   Head: Normocephalic and atraumatic.  ?Eyes:  ?   Conjunctiva/sclera: Conjunctivae normal.  ?Cardiovascular:  ?   Rate and Rhythm: Normal rate. Rhythm irregular.  ?   Heart sounds: No murmur heard. ?Pulmonary:  ?   Effort: Pulmonary effort is normal. No respiratory distress.  ?   Breath sounds: Normal breath sounds.  ?Abdominal:  ?   Palpations: Abdomen is soft.  ?   Tenderness: There is no abdominal tenderness.  ?Musculoskeletal:     ?   General: No swelling.  ?   Cervical back: Neck supple.  ?   Right lower leg: No edema.  ?   Left lower leg: No edema.  ?Skin: ?   General: Skin is warm and dry.  ?   Capillary Refill: Capillary refill takes less than 2 seconds.  ?Neurological:  ?   Mental Status: She is alert.  ?Psychiatric:     ?   Mood and Affect: Mood normal.  ? ? ?ED Results / Procedures / Treatments   ?Labs ?(all labs ordered are listed, but only abnormal results are displayed) ?Labs Reviewed  ?CBC WITH DIFFERENTIAL/PLATELET - Abnormal; Notable for the following components:  ?    Result Value  ? RBC 3.31 (*)   ? Hemoglobin 8.8 (*)   ? HCT 29.9 (*)   ? MCHC 29.4 (*)   ? RDW 16.9 (*)   ? All other components within normal limits  ?COMPREHENSIVE METABOLIC PANEL - Abnormal; Notable for the following components:  ? Glucose, Bld 105 (*)   ? Creatinine, Ser 1.04 (*)   ? Calcium 8.6 (*)   ? Albumin 3.1 (*)   ? GFR, Estimated 52 (*)   ? All other components within normal limits  ?BRAIN NATRIURETIC PEPTIDE - Abnormal; Notable for the following components:  ? B Natriuretic Peptide 399.0 (*)   ? All other components within  normal limits  ?POC OCCULT BLOOD, ED  ?TROPONIN I (HIGH SENSITIVITY)  ?TROPONIN I (HIGH SENSITIVITY)  ? ? ?EKG ?EKG Interpretation ? ?Date/Time:  Monday Mar 17 2022 18:32:13 EDT ?Ventricular Rate:  107 ?PR Interval:    ?QRS Duration: 92 ?QT Interval:  372 ?QTC Calculation: 497 ?R Axis:   107 ?Text Interpretation: Atrial fibrillation Right axis deviation Borderline low voltage, extremity leads Borderline prolonged QT interval Confirmed by Madalyn Rob [phone])  on 03/17/2022 7:42:31 PM ? ?Radiology ?DG Chest 2 View ? ?Result Date: 03/17/2022 ?CLINICAL DATA:  Dyspnea and shortness of breath with hypoxia. EXAM: CHEST - 2 VIEW COMPARISON:  Portable chest 01/18/2021 FINDINGS: The heart is moderately enlarged. There is aortic atherosclerosis and tortuosity, and again noted mild perihilar vascular congestion without overt edema. There are small layering pleural effusions with overlying atelectasis or consolidation in the bases. Lungs are otherwise clear with low inspiration. Thoracic spondylosis and osteopenia. Multilevel thoracic spine bridging enthesopathy. IMPRESSION: Cardiomegaly with perihilar vascular distension without overt edema, small pleural effusions, and overlying atelectasis or consolidation. Low inspiration. Electronically Signed   By: Telford Nab M.D.   On: 03/17/2022 20:45   ? ?Procedures ?Procedures  ? ? ?Medications Ordered in ED ?Medications  ?acetaminophen (TYLENOL) tablet 650 mg (650 mg Oral Given 03/17/22 2250)  ? ? ?ED Course/ Medical Decision Making/ A&P ?  ?                        ?Medical Decision Making ?Amount and/or Complexity of Data Reviewed ?Labs: ordered. ?Radiology: ordered. ? ?Risk ?OTC drugs. ? ? ?88 year old lady with history of atrial fibrillation on Eliquis presenting to ER due to concern for shortness of breath.  In ER, patient denied ongoing shortness of breath.  She appeared well in no distress, no tachypnea or hypoxia.  EKG demonstrating A-fib, cardiac monitor shows consistent  A-fib.  BNP mildly elevated, similar to prior.  No leg edema.  CXR with some cardiomegaly and peripheral vascular distention but no overt edema.  There are small oral effusion noted.  Similar findings on CXR fr

## 2022-03-17 NOTE — ED Notes (Signed)
Patient verbalizes understanding of d/c instructions. Opportunities for questions and answers were provided. Pt d/c from ED and wheeled to lobby with daughter.  

## 2022-03-17 NOTE — ED Notes (Signed)
Walked pt with O2 and patient O2 stayed between 96-97 %. ?

## 2022-03-18 DIAGNOSIS — R0602 Shortness of breath: Secondary | ICD-10-CM | POA: Diagnosis not present

## 2022-03-19 ENCOUNTER — Emergency Department (HOSPITAL_COMMUNITY): Payer: Medicare HMO

## 2022-03-19 ENCOUNTER — Telehealth: Payer: Self-pay

## 2022-03-19 ENCOUNTER — Emergency Department (HOSPITAL_COMMUNITY)
Admission: EM | Admit: 2022-03-19 | Discharge: 2022-03-20 | Disposition: A | Discharge status: Home / Self Care | Payer: Medicare HMO | Attending: Emergency Medicine | Admitting: Emergency Medicine

## 2022-03-19 DIAGNOSIS — R0602 Shortness of breath: Secondary | ICD-10-CM | POA: Diagnosis not present

## 2022-03-19 DIAGNOSIS — R197 Diarrhea, unspecified: Secondary | ICD-10-CM | POA: Diagnosis not present

## 2022-03-19 DIAGNOSIS — R079 Chest pain, unspecified: Secondary | ICD-10-CM | POA: Diagnosis not present

## 2022-03-19 DIAGNOSIS — J90 Pleural effusion, not elsewhere classified: Secondary | ICD-10-CM | POA: Insufficient documentation

## 2022-03-19 DIAGNOSIS — I340 Nonrheumatic mitral (valve) insufficiency: Secondary | ICD-10-CM

## 2022-03-19 DIAGNOSIS — J811 Chronic pulmonary edema: Secondary | ICD-10-CM | POA: Diagnosis not present

## 2022-03-19 DIAGNOSIS — R0902 Hypoxemia: Secondary | ICD-10-CM | POA: Diagnosis not present

## 2022-03-19 DIAGNOSIS — R001 Bradycardia, unspecified: Secondary | ICD-10-CM | POA: Diagnosis not present

## 2022-03-19 DIAGNOSIS — H353221 Exudative age-related macular degeneration, left eye, with active choroidal neovascularization: Secondary | ICD-10-CM | POA: Diagnosis not present

## 2022-03-19 DIAGNOSIS — R000 Tachycardia, unspecified: Secondary | ICD-10-CM | POA: Insufficient documentation

## 2022-03-19 DIAGNOSIS — I4821 Permanent atrial fibrillation: Secondary | ICD-10-CM

## 2022-03-19 DIAGNOSIS — J9811 Atelectasis: Secondary | ICD-10-CM | POA: Diagnosis not present

## 2022-03-19 DIAGNOSIS — R0789 Other chest pain: Secondary | ICD-10-CM | POA: Diagnosis not present

## 2022-03-19 LAB — BASIC METABOLIC PANEL
Anion gap: 9 (ref 5–15)
BUN: 10 mg/dL (ref 8–23)
CO2: 23 mmol/L (ref 22–32)
Calcium: 8.8 mg/dL — ABNORMAL LOW (ref 8.9–10.3)
Chloride: 106 mmol/L (ref 98–111)
Creatinine, Ser: 1.09 mg/dL — ABNORMAL HIGH (ref 0.44–1.00)
GFR, Estimated: 49 mL/min — ABNORMAL LOW (ref >60)
Glucose, Bld: 132 mg/dL — ABNORMAL HIGH (ref 70–99)
Potassium: 4.6 mmol/L (ref 3.5–5.1)
Sodium: 138 mmol/L (ref 135–145)

## 2022-03-19 LAB — CBC WITH DIFFERENTIAL/PLATELET
Abs Immature Granulocytes: 0.05 10*3/uL (ref 0.00–0.07)
Basophils Absolute: 0 10*3/uL (ref 0.0–0.1)
Basophils Relative: 0 %
Eosinophils Absolute: 0.1 10*3/uL (ref 0.0–0.5)
Eosinophils Relative: 1 %
HCT: 31 % — ABNORMAL LOW (ref 36.0–46.0)
Hemoglobin: 9.5 g/dL — ABNORMAL LOW (ref 12.0–15.0)
Immature Granulocytes: 1 %
Lymphocytes Relative: 16 %
Lymphs Abs: 1.3 10*3/uL (ref 0.7–4.0)
MCH: 27.3 pg (ref 26.0–34.0)
MCHC: 30.6 g/dL (ref 30.0–36.0)
MCV: 89.1 fL (ref 80.0–100.0)
Monocytes Absolute: 0.7 10*3/uL (ref 0.1–1.0)
Monocytes Relative: 8 %
Neutro Abs: 6.2 10*3/uL (ref 1.7–7.7)
Neutrophils Relative %: 74 %
Platelets: 236 10*3/uL (ref 150–400)
RBC: 3.48 MIL/uL — ABNORMAL LOW (ref 3.87–5.11)
RDW: 17.4 % — ABNORMAL HIGH (ref 11.5–15.5)
WBC: 8.4 10*3/uL (ref 4.0–10.5)
nRBC: 0 % (ref 0.0–0.2)

## 2022-03-19 LAB — TROPONIN I (HIGH SENSITIVITY): Troponin I (High Sensitivity): 8 ng/L (ref <18)

## 2022-03-19 LAB — D-DIMER, QUANTITATIVE: D-Dimer, Quant: 2.12 ug/mL-FEU — ABNORMAL HIGH (ref 0.00–0.50)

## 2022-03-19 LAB — BRAIN NATRIURETIC PEPTIDE: B Natriuretic Peptide: 327.1 pg/mL — ABNORMAL HIGH (ref 0.0–100.0)

## 2022-03-19 LAB — CBG MONITORING, ED: Glucose-Capillary: 141 mg/dL — ABNORMAL HIGH (ref 70–99)

## 2022-03-19 MED ORDER — IOHEXOL 350 MG/ML SOLN
75.0000 mL | Freq: Once | INTRAVENOUS | Status: AC | PRN
  Administered 2022-03-19: 75 mL via INTRAVENOUS

## 2022-03-19 MED ORDER — ACETAMINOPHEN 500 MG PO TABS
1000.0000 mg | ORAL_TABLET | Freq: Once | ORAL | Status: AC
  Administered 2022-03-19: 1000 mg via ORAL
  Filled 2022-03-19: qty 2

## 2022-03-19 MED ORDER — DICLOFENAC SODIUM 1 % EX GEL: 2.0000 g | Freq: Four times a day (QID) | CUTANEOUS | 0 refills | Status: DC

## 2022-03-19 NOTE — Telephone Encounter (Signed)
Josue Hector, MD  Michaelyn Barter, RN ?Needs updated echo previous EF 50-55% with moderate MR/TR she is on lasix already afib is chronic and on eliquis f/u with PA/NP after echo  ? ?Called patient to inform her that Dr. Johnsie Cancel would like her to get an echocardiogram before her scheduled appointment with him in June. Placed order for scheduling to call and make her an appointment. ?

## 2022-03-19 NOTE — ED Triage Notes (Addendum)
BIB [facility] from home. Hx afib. Left sided chest pain starting at 1930, non radiating, no sob, diarrhea, 88% RA with fire-100% on 4LPM [place]. CP resolved upon arrival to ED. Language barrier-unsure of language at this time. Went to dental appt and doctor appointment today. ?

## 2022-03-19 NOTE — Discharge Instructions (Addendum)
Follow up with your family doc.  Return for worsening symptoms. Take your lasix as prescribed. Use the gel as prescribed.  ? ?Take tylenol '1000mg'$ (2 extra strength) four times a day.  ? ?

## 2022-03-19 NOTE — ED Provider Notes (Signed)
?[facility] ?Provider Note ? ? ?CSN: [id number] ?Arrival date & time: 03/19/22  2040 ? ?  ? ?History ? ?Chief Complaint  ?Patient presents with  ? Chest Pain  ? ? ?Anne Roach is a 88 y.o. female. ? ?88 yo F with a chief complaints of difficulty breathing and chest discomfort.  This started suddenly while she was at home.  She has trouble describing it.  The best that she can tell me is it feels like palpitations.  Happened a couple days ago and she came to the ER and was discharged back home.  Since then has been feeling fine.  Like last time improved significantly with oxygen administration.  Not having any symptoms currently.  Has a history of chronic lower extremity edema.  Denies any change.  Denies cough congestion or fever.  Denies trauma. ? ? ? ?  ? ?Home Medications ?Prior to Admission medications   ?Medication Sig Start Date End Date Taking? Authorizing Provider  ?ACCU-CHEK AVIVA PLUS test strip  10/06/20   [provider]  ?acetaminophen (TYLENOL) 500 MG tablet Take 1,000 mg by mouth every 6 (six) hours as needed for mild pain or headache.    [provider]  ?allopurinol (ZYLOPRIM) 100 MG tablet TAKE 1 TABLET(100 MG) BY MOUTH DAILY 08/09/21   Edrick Kins, DPM  ?apixaban (ELIQUIS) 5 MG TABS tablet TAKE 1 TABLET(5 MG) BY MOUTH TWICE DAILY 01/22/22   Josue Hector, MD  ?cholecalciferol (VITAMIN D3) 25 MCG (1000 UNIT) tablet Take 1,000 Units by mouth daily with breakfast.    [provider]  ?colchicine 0.6 MG tablet Take 1 tablet (0.6 mg total) by mouth daily. 10/17/20   Edrick Kins, DPM  ?diclofenac Sodium (VOLTAREN) 1 % GEL Apply 2 g topically 4 (four) times daily. 03/19/22   Deno Etienne, DO  ?diltiazem (CARDIZEM CD) 240 MG 24 hr capsule Take 1 capsule (240 mg total) by mouth daily. TAKE 1 CAPSULE(240 MG) BY MOUTH DAILY. PLEASE MAKE OVERDUE APPOINTMENT WITH DOCTOR [place] ANYMORE REFILLS. THANK YOU 3RD AND FINAL ATTEMPT 02/03/22    Josue Hector, MD  ?diltiazem (CARDIZEM) 30 MG tablet Take 1 tablet every 4 hours AS NEEDED for heart rate >100 as long as blood pressure >100. 02/19/21   Fenton, Clint R, PA  ?ferrous sulfate 325 (65 FE) MG tablet Take 1 tablet (325 mg total) by mouth daily. 03/17/22   Lucrezia Starch, MD  ?furosemide (LASIX) 40 MG tablet Take 1 tablet (40 mg total) by mouth daily. 11/05/20 11/05/21  Regalado, Jerald Kief A, MD  ?gabapentin (NEURONTIN) 100 MG capsule Take 1 capsule (100 mg total) by mouth 3 (three) times daily. 10/17/20   Edrick Kins, DPM  ?MAGNESIUM PO Take 1 tablet by mouth daily with lunch.    [provider]  ?metFORMIN (GLUCOPHAGE-XR) 750 MG 24 hr tablet Take 750 mg by mouth at bedtime. 05/14/15   [provider]  ?Multiple Vitamins-Minerals (ICAPS AREDS 2 PO) Take 1 capsule by mouth 2 (two) times daily.    [provider]  ?potassium chloride SA (KLOR-CON) 20 MEQ tablet TAKE 2 TABLETS(40 MEQ) BY MOUTH DAILY 01/24/21   Hunsucker, Bonna Gains, MD  ?   ? ?Allergies    ?Patient has no known allergies.   ? ?Review of Systems   ?Review of Systems ? ?Physical Exam ?Updated Vital Signs ?BP (!) 138/125   Pulse 72   Temp 99.5 ?F (37.5 ?C) (Oral)  Resp 20   SpO2 97%  ?Physical Exam ?Vitals and nursing note reviewed.  ?Constitutional:   ?   General: She is not in acute distress. ?   Appearance: She is well-developed. She is not diaphoretic.  ?HENT:  ?   Head: Normocephalic and atraumatic.  ?Eyes:  ?   Pupils: Pupils are equal, round, and reactive to light.  ?Cardiovascular:  ?   Rate and Rhythm: Tachycardia present. Rhythm irregular.  ?   Heart sounds: No murmur heard. ?  No friction rub. No gallop.  ?Pulmonary:  ?   Effort: Pulmonary effort is normal.  ?   Breath sounds: No wheezing or rales.  ?Abdominal:  ?   General: There is no distension.  ?   Palpations: Abdomen is soft.  ?   Tenderness: There is no abdominal tenderness.  ?Musculoskeletal:     ?   General: Tenderness present.  ?   Cervical  back: Normal range of motion and neck supple.  ?   Comments: Pain with palpation of the left anterior chest wall.  ?Skin: ?   General: Skin is warm and dry.  ?Neurological:  ?   Mental Status: She is alert and oriented to person, place, and time.  ?Psychiatric:     ?   Behavior: Behavior normal.  ? ? ?ED Results / Procedures / Treatments   ?Labs ?(all labs ordered are listed, but only abnormal results are displayed) ?Labs Reviewed  ?BASIC METABOLIC PANEL - Abnormal; Notable for the following components:  ?    Result Value  ? Glucose, Bld 132 (*)   ? Creatinine, Ser 1.09 (*)   ? Calcium 8.8 (*)   ? GFR, Estimated 49 (*)   ? All other components within normal limits  ?CBC WITH DIFFERENTIAL/PLATELET - Abnormal; Notable for the following components:  ? RBC 3.48 (*)   ? Hemoglobin 9.5 (*)   ? HCT 31.0 (*)   ? RDW 17.4 (*)   ? All other components within normal limits  ?D-DIMER, QUANTITATIVE - Abnormal; Notable for the following components:  ? D-Dimer, Quant 2.12 (*)   ? All other components within normal limits  ?BRAIN NATRIURETIC PEPTIDE - Abnormal; Notable for the following components:  ? B Natriuretic Peptide 327.1 (*)   ? All other components within normal limits  ?CBG MONITORING, ED - Abnormal; Notable for the following components:  ? Glucose-Capillary 141 (*)   ? All other components within normal limits  ?TROPONIN I (HIGH SENSITIVITY)  ?TROPONIN I (HIGH SENSITIVITY)  ? ? ?EKG ?EKG Interpretation ? ?Date/Time:  Wednesday Mar 19 2022 20:45:16 EDT ?Ventricular Rate:  104 ?PR Interval:    ?QRS Duration: 89 ?QT Interval:  389 ?QTC Calculation: 512 ?R Axis:   -21 ?Text Interpretation: Atrial fibrillation Borderline left axis deviation Low voltage, precordial leads Consider anterior infarct Borderline T abnormalities, inferior leads Prolonged QT interval No significant change since last tracing Confirmed by Deno Etienne [phone]) on 03/19/2022 8:49:11 PM ? ?Radiology ?CT Angio Chest PE W and/or Wo Contrast ? ?Result Date:  03/19/2022 ?CLINICAL DATA:  Concern for pulmonary embolism. EXAM: CT ANGIOGRAPHY CHEST WITH CONTRAST TECHNIQUE: Multidetector CT imaging of the chest was performed using the standard protocol during bolus administration of intravenous contrast. Multiplanar CT image reconstructions and MIPs were obtained to evaluate the vascular anatomy. RADIATION DOSE REDUCTION: This exam was performed according to the departmental dose-optimization program which includes automated exposure control, adjustment of the mA and/or kV according to patient size and/or use of  iterative reconstruction technique. CONTRAST:  55m OMNIPAQUE IOHEXOL 350 MG/ML SOLN COMPARISON:  Chest radiograph dated 03/19/2022. FINDINGS: Evaluation of this exam is limited due to respiratory motion artifact. Cardiovascular: There is moderate cardiomegaly. No pericardial effusion. Three-vessel coronary vascular calcification. There is retrograde flow of contrast from the right atrium into the IVC suggestive of right heart dysfunction. There is moderate atherosclerotic calcification of the thoracic aorta. No aneurysmal dilatation. Evaluation of the pulmonary arteries is somewhat limited due to respiratory motion artifact and suboptimal visualization of the peripheral branches. No pulmonary artery embolus identified. Mediastinum/Nodes: No hilar or mediastinal adenopathy. Several small mediastinal lymph nodes noted. The esophagus is grossly unremarkable. No mediastinal fluid collection. Lungs/Pleura: There is a moderate size right and small left pleural effusion. There are partial consolidative changes of the lower lobes as well as right middle lobe which may represent atelectasis or pneumonia. Underlying mass is not excluded clinical correlation and follow-up to resolution recommended. There is no pneumothorax. The central airways are patent. Upper Abdomen: No acute abnormality. Musculoskeletal: Osteopenia with degenerative changes of the spine. No acute osseous  pathology. Review of the MIP images confirms the above findings. IMPRESSION: 1. No CT evidence of pulmonary artery embolus. 2. Moderate cardiomegaly with findings of right heart dysfunction. 3. Moderate right an

## 2022-03-20 LAB — TROPONIN I (HIGH SENSITIVITY): Troponin I (High Sensitivity): 7 ng/L (ref <18)

## 2022-03-20 NOTE — ED Notes (Signed)
Pt verbalized understanding of d/c instructions, meds, and followup care. Denies questions. VSS, no distress noted. Steady gait to exit with all belongings. Assisted in wheelchair to lobby. Daughter driving home. ?

## 2022-03-21 DIAGNOSIS — R002 Palpitations: Secondary | ICD-10-CM | POA: Diagnosis not present

## 2022-03-26 DIAGNOSIS — R079 Chest pain, unspecified: Secondary | ICD-10-CM | POA: Diagnosis not present

## 2022-03-27 ENCOUNTER — Other Ambulatory Visit: Payer: Self-pay

## 2022-03-27 ENCOUNTER — Emergency Department (HOSPITAL_COMMUNITY): Payer: Medicare HMO

## 2022-03-27 ENCOUNTER — Encounter (HOSPITAL_COMMUNITY): Payer: Self-pay | Admitting: Emergency Medicine

## 2022-03-27 ENCOUNTER — Inpatient Hospital Stay (HOSPITAL_COMMUNITY)
Admission: EM | Admit: 2022-03-27 | Discharge: 2022-04-02 | DRG: 291 | Disposition: A | Discharge status: Home Health | Payer: Medicare HMO | Attending: Internal Medicine | Admitting: Internal Medicine

## 2022-03-27 ENCOUNTER — Inpatient Hospital Stay (HOSPITAL_COMMUNITY): Payer: Medicare HMO

## 2022-03-27 DIAGNOSIS — G8929 Other chronic pain: Secondary | ICD-10-CM | POA: Diagnosis present

## 2022-03-27 DIAGNOSIS — M545 Low back pain, unspecified: Secondary | ICD-10-CM | POA: Diagnosis present

## 2022-03-27 DIAGNOSIS — E669 Obesity, unspecified: Secondary | ICD-10-CM | POA: Diagnosis present

## 2022-03-27 DIAGNOSIS — R110 Nausea: Secondary | ICD-10-CM | POA: Diagnosis not present

## 2022-03-27 DIAGNOSIS — E875 Hyperkalemia: Secondary | ICD-10-CM | POA: Diagnosis not present

## 2022-03-27 DIAGNOSIS — I509 Heart failure, unspecified: Secondary | ICD-10-CM | POA: Diagnosis not present

## 2022-03-27 DIAGNOSIS — Z79899 Other long term (current) drug therapy: Secondary | ICD-10-CM | POA: Diagnosis not present

## 2022-03-27 DIAGNOSIS — J90 Pleural effusion, not elsewhere classified: Secondary | ICD-10-CM | POA: Diagnosis not present

## 2022-03-27 DIAGNOSIS — I130 Hypertensive heart and chronic kidney disease with heart failure and stage 1 through stage 4 chronic kidney disease, or unspecified chronic kidney disease: Secondary | ICD-10-CM | POA: Diagnosis not present

## 2022-03-27 DIAGNOSIS — I700 Atherosclerosis of aorta: Secondary | ICD-10-CM | POA: Diagnosis present

## 2022-03-27 DIAGNOSIS — Z79890 Hormone replacement therapy (postmenopausal): Secondary | ICD-10-CM

## 2022-03-27 DIAGNOSIS — I083 Combined rheumatic disorders of mitral, aortic and tricuspid valves: Secondary | ICD-10-CM | POA: Diagnosis present

## 2022-03-27 DIAGNOSIS — N1832 Chronic kidney disease, stage 3b: Secondary | ICD-10-CM | POA: Diagnosis present

## 2022-03-27 DIAGNOSIS — I5021 Acute systolic (congestive) heart failure: Secondary | ICD-10-CM | POA: Diagnosis not present

## 2022-03-27 DIAGNOSIS — Z833 Family history of diabetes mellitus: Secondary | ICD-10-CM | POA: Diagnosis not present

## 2022-03-27 DIAGNOSIS — I4821 Permanent atrial fibrillation: Secondary | ICD-10-CM | POA: Diagnosis not present

## 2022-03-27 DIAGNOSIS — I110 Hypertensive heart disease with heart failure: Secondary | ICD-10-CM | POA: Diagnosis not present

## 2022-03-27 DIAGNOSIS — I10 Essential (primary) hypertension: Secondary | ICD-10-CM | POA: Diagnosis not present

## 2022-03-27 DIAGNOSIS — I951 Orthostatic hypotension: Secondary | ICD-10-CM | POA: Diagnosis not present

## 2022-03-27 DIAGNOSIS — Z6827 Body mass index (BMI) 27.0-27.9, adult: Secondary | ICD-10-CM

## 2022-03-27 DIAGNOSIS — R000 Tachycardia, unspecified: Secondary | ICD-10-CM | POA: Diagnosis not present

## 2022-03-27 DIAGNOSIS — R0602 Shortness of breath: Secondary | ICD-10-CM | POA: Diagnosis not present

## 2022-03-27 DIAGNOSIS — G4733 Obstructive sleep apnea (adult) (pediatric): Secondary | ICD-10-CM | POA: Diagnosis present

## 2022-03-27 DIAGNOSIS — N1831 Chronic kidney disease, stage 3a: Secondary | ICD-10-CM | POA: Diagnosis not present

## 2022-03-27 DIAGNOSIS — R0902 Hypoxemia: Secondary | ICD-10-CM | POA: Diagnosis not present

## 2022-03-27 DIAGNOSIS — M109 Gout, unspecified: Secondary | ICD-10-CM | POA: Diagnosis present

## 2022-03-27 DIAGNOSIS — E1122 Type 2 diabetes mellitus with diabetic chronic kidney disease: Secondary | ICD-10-CM | POA: Diagnosis present

## 2022-03-27 DIAGNOSIS — I5033 Acute on chronic diastolic (congestive) heart failure: Secondary | ICD-10-CM | POA: Diagnosis present

## 2022-03-27 DIAGNOSIS — R1310 Dysphagia, unspecified: Secondary | ICD-10-CM | POA: Diagnosis present

## 2022-03-27 DIAGNOSIS — I3139 Other pericardial effusion (noninflammatory): Secondary | ICD-10-CM | POA: Diagnosis not present

## 2022-03-27 DIAGNOSIS — N183 Chronic kidney disease, stage 3 unspecified: Secondary | ICD-10-CM | POA: Diagnosis present

## 2022-03-27 DIAGNOSIS — D638 Anemia in other chronic diseases classified elsewhere: Secondary | ICD-10-CM | POA: Diagnosis not present

## 2022-03-27 DIAGNOSIS — Z7901 Long term (current) use of anticoagulants: Secondary | ICD-10-CM | POA: Diagnosis not present

## 2022-03-27 DIAGNOSIS — R1900 Intra-abdominal and pelvic swelling, mass and lump, unspecified site: Secondary | ICD-10-CM | POA: Diagnosis not present

## 2022-03-27 DIAGNOSIS — I5023 Acute on chronic systolic (congestive) heart failure: Secondary | ICD-10-CM | POA: Diagnosis not present

## 2022-03-27 DIAGNOSIS — E119 Type 2 diabetes mellitus without complications: Secondary | ICD-10-CM

## 2022-03-27 DIAGNOSIS — R0682 Tachypnea, not elsewhere classified: Secondary | ICD-10-CM | POA: Diagnosis not present

## 2022-03-27 HISTORY — DX: Acute on chronic diastolic (congestive) heart failure: I50.33

## 2022-03-27 LAB — BASIC METABOLIC PANEL
Anion gap: 9 (ref 5–15)
BUN: 12 mg/dL (ref 8–23)
CO2: 25 mmol/L (ref 22–32)
Calcium: 8.6 mg/dL — ABNORMAL LOW (ref 8.9–10.3)
Chloride: 103 mmol/L (ref 98–111)
Creatinine, Ser: 0.98 mg/dL (ref 0.44–1.00)
GFR, Estimated: 56 mL/min — ABNORMAL LOW (ref >60)
Glucose, Bld: 170 mg/dL — ABNORMAL HIGH (ref 70–99)
Potassium: 4.7 mmol/L (ref 3.5–5.1)
Sodium: 137 mmol/L (ref 135–145)

## 2022-03-27 LAB — CBC WITH DIFFERENTIAL/PLATELET
Abs Immature Granulocytes: 0.04 10*3/uL (ref 0.00–0.07)
Basophils Absolute: 0.1 10*3/uL (ref 0.0–0.1)
Basophils Relative: 1 %
Eosinophils Absolute: 0.1 10*3/uL (ref 0.0–0.5)
Eosinophils Relative: 2 %
HCT: 31.6 % — ABNORMAL LOW (ref 36.0–46.0)
Hemoglobin: 9.3 g/dL — ABNORMAL LOW (ref 12.0–15.0)
Immature Granulocytes: 1 %
Lymphocytes Relative: 22 %
Lymphs Abs: 1.7 10*3/uL (ref 0.7–4.0)
MCH: 26.8 pg (ref 26.0–34.0)
MCHC: 29.4 g/dL — ABNORMAL LOW (ref 30.0–36.0)
MCV: 91.1 fL (ref 80.0–100.0)
Monocytes Absolute: 0.7 10*3/uL (ref 0.1–1.0)
Monocytes Relative: 9 %
Neutro Abs: 5.3 10*3/uL (ref 1.7–7.7)
Neutrophils Relative %: 65 %
Platelets: 267 10*3/uL (ref 150–400)
RBC: 3.47 MIL/uL — ABNORMAL LOW (ref 3.87–5.11)
RDW: 19 % — ABNORMAL HIGH (ref 11.5–15.5)
WBC: 7.9 10*3/uL (ref 4.0–10.5)
nRBC: 0 % (ref 0.0–0.2)

## 2022-03-27 LAB — HEMOGLOBIN A1C
Hgb A1c MFr Bld: 6.1 % — ABNORMAL HIGH (ref 4.8–5.6)
Mean Plasma Glucose: 128.37 mg/dL

## 2022-03-27 LAB — GLUCOSE, CAPILLARY: Glucose-Capillary: 106 mg/dL — ABNORMAL HIGH (ref 70–99)

## 2022-03-27 LAB — TROPONIN I (HIGH SENSITIVITY)
Troponin I (High Sensitivity): 7 ng/L (ref <18)
Troponin I (High Sensitivity): 7 ng/L (ref <18)

## 2022-03-27 LAB — ECHOCARDIOGRAM COMPLETE
Height: 65 in
S' Lateral: 3.7 cm
Weight: 2645.52 oz

## 2022-03-27 LAB — CBG MONITORING, ED: Glucose-Capillary: 108 mg/dL — ABNORMAL HIGH (ref 70–99)

## 2022-03-27 LAB — MAGNESIUM: Magnesium: 2.4 mg/dL (ref 1.7–2.4)

## 2022-03-27 LAB — BRAIN NATRIURETIC PEPTIDE: B Natriuretic Peptide: 449.7 pg/mL — ABNORMAL HIGH (ref 0.0–100.0)

## 2022-03-27 MED ORDER — SODIUM CHLORIDE 0.9% FLUSH
3.0000 mL | Freq: Two times a day (BID) | INTRAVENOUS | Status: DC
  Administered 2022-03-28 – 2022-04-02 (×11): 3 mL via INTRAVENOUS

## 2022-03-27 MED ORDER — INSULIN ASPART 100 UNIT/ML IJ SOLN: 0.0000 [IU] | Freq: Every day | INTRAMUSCULAR | Status: DC

## 2022-03-27 MED ORDER — FOLIC ACID 1 MG PO TABS
1000.0000 ug | ORAL_TABLET | Freq: Every day | ORAL | Status: DC
  Administered 2022-03-27 – 2022-04-02 (×7): 1 mg via ORAL
  Filled 2022-03-27 (×7): qty 1

## 2022-03-27 MED ORDER — APIXABAN 5 MG PO TABS
5.0000 mg | ORAL_TABLET | Freq: Two times a day (BID) | ORAL | Status: DC
Start: 2022-03-27 — End: 2022-04-02
  Administered 2022-03-27 – 2022-04-02 (×13): 5 mg via ORAL
  Filled 2022-03-27 (×13): qty 1

## 2022-03-27 MED ORDER — OXYCODONE HCL 5 MG PO TABS
5.0000 mg | ORAL_TABLET | ORAL | Status: DC | PRN
  Administered 2022-03-29 – 2022-03-31 (×3): 5 mg via ORAL
  Filled 2022-03-27 (×4): qty 1

## 2022-03-27 MED ORDER — GABAPENTIN 400 MG PO CAPS
400.0000 mg | ORAL_CAPSULE | Freq: Three times a day (TID) | ORAL | Status: DC
  Administered 2022-03-27 – 2022-03-29 (×6): 400 mg via ORAL
  Filled 2022-03-27 (×6): qty 1

## 2022-03-27 MED ORDER — GABAPENTIN 100 MG PO CAPS: 100.0000 mg | ORAL_CAPSULE | Freq: Three times a day (TID) | ORAL | Status: DC

## 2022-03-27 MED ORDER — ONDANSETRON HCL 4 MG PO TABS
4.0000 mg | ORAL_TABLET | Freq: Four times a day (QID) | ORAL | Status: DC | PRN
  Filled 2022-03-27: qty 1

## 2022-03-27 MED ORDER — DILTIAZEM HCL ER COATED BEADS 240 MG PO CP24
240.0000 mg | ORAL_CAPSULE | Freq: Every day | ORAL | Status: DC
  Administered 2022-03-27: 240 mg via ORAL
  Filled 2022-03-27: qty 1

## 2022-03-27 MED ORDER — ONDANSETRON HCL 4 MG/2ML IJ SOLN
4.0000 mg | Freq: Four times a day (QID) | INTRAMUSCULAR | Status: DC | PRN
Start: 2022-03-27 — End: 2022-04-02
  Administered 2022-03-29 – 2022-04-01 (×3): 4 mg via INTRAVENOUS
  Filled 2022-03-27 (×3): qty 2

## 2022-03-27 MED ORDER — ALLOPURINOL 100 MG PO TABS
100.0000 mg | ORAL_TABLET | Freq: Every day | ORAL | Status: DC
  Administered 2022-03-28 – 2022-04-02 (×6): 100 mg via ORAL
  Filled 2022-03-27 (×6): qty 1

## 2022-03-27 MED ORDER — METOPROLOL SUCCINATE ER 50 MG PO TB24
50.0000 mg | ORAL_TABLET | Freq: Every day | ORAL | Status: DC
  Administered 2022-03-28 – 2022-04-02 (×6): 50 mg via ORAL
  Filled 2022-03-27 (×6): qty 1

## 2022-03-27 MED ORDER — BISACODYL 5 MG PO TBEC: 5.0000 mg | DELAYED_RELEASE_TABLET | Freq: Every day | ORAL | Status: DC | PRN

## 2022-03-27 MED ORDER — HYDRALAZINE HCL 20 MG/ML IJ SOLN: 5.0000 mg | INTRAMUSCULAR | Status: DC | PRN

## 2022-03-27 MED ORDER — ACETAMINOPHEN 650 MG RE SUPP: 650.0000 mg | Freq: Four times a day (QID) | RECTAL | Status: DC | PRN

## 2022-03-27 MED ORDER — DOCUSATE SODIUM 100 MG PO CAPS
100.0000 mg | ORAL_CAPSULE | Freq: Two times a day (BID) | ORAL | Status: DC
  Administered 2022-03-28 – 2022-04-02 (×11): 100 mg via ORAL
  Filled 2022-03-27 (×11): qty 1

## 2022-03-27 MED ORDER — INSULIN ASPART 100 UNIT/ML IJ SOLN
0.0000 [IU] | Freq: Three times a day (TID) | INTRAMUSCULAR | Status: DC
  Administered 2022-03-28: 5 [IU] via SUBCUTANEOUS
  Administered 2022-04-01: 2 [IU] via SUBCUTANEOUS
  Administered 2022-04-02: 3 [IU] via SUBCUTANEOUS

## 2022-03-27 MED ORDER — TRAZODONE HCL 50 MG PO TABS
25.0000 mg | ORAL_TABLET | Freq: Every evening | ORAL | Status: DC | PRN
  Administered 2022-03-28 – 2022-04-01 (×4): 25 mg via ORAL
  Filled 2022-03-27 (×5): qty 1

## 2022-03-27 MED ORDER — ACETAMINOPHEN 325 MG PO TABS
650.0000 mg | ORAL_TABLET | Freq: Four times a day (QID) | ORAL | Status: DC | PRN
  Administered 2022-03-27 – 2022-04-02 (×10): 650 mg via ORAL
  Filled 2022-03-27 (×10): qty 2

## 2022-03-27 MED ORDER — TRAMADOL HCL 50 MG PO TABS
50.0000 mg | ORAL_TABLET | Freq: Every day | ORAL | Status: DC | PRN
  Administered 2022-03-28 – 2022-03-30 (×4): 50 mg via ORAL
  Filled 2022-03-27 (×4): qty 1

## 2022-03-27 MED ORDER — FUROSEMIDE 10 MG/ML IJ SOLN
40.0000 mg | Freq: Two times a day (BID) | INTRAMUSCULAR | Status: DC
  Administered 2022-03-27 – 2022-03-28 (×3): 40 mg via INTRAVENOUS
  Filled 2022-03-27 (×3): qty 4

## 2022-03-27 MED ORDER — ONDANSETRON HCL 4 MG/2ML IJ SOLN
4.0000 mg | Freq: Once | INTRAMUSCULAR | Status: AC
  Administered 2022-03-27: 4 mg via INTRAVENOUS
  Filled 2022-03-27: qty 2

## 2022-03-27 MED ORDER — METOPROLOL SUCCINATE ER 25 MG PO TB24: 25.0000 mg | ORAL_TABLET | Freq: Every day | ORAL | Status: DC

## 2022-03-27 MED ORDER — POLYETHYLENE GLYCOL 3350 17 G PO PACK: 17.0000 g | PACK | Freq: Every day | ORAL | Status: DC | PRN

## 2022-03-27 MED ORDER — FUROSEMIDE 10 MG/ML IJ SOLN
40.0000 mg | Freq: Once | INTRAMUSCULAR | Status: AC
  Administered 2022-03-27: 40 mg via INTRAVENOUS
  Filled 2022-03-27: qty 4

## 2022-03-27 NOTE — H&P (Signed)
?History and Physical  ? ? ?Patient: Anne Roach DGU:[id number] DOB: Apr 09, 1933 ?DOA: 03/27/2022 ?DOS: the patient was seen and examined on 03/27/2022 ?PCP: Lin Landsman, MD  ?Patient coming from: Home - lives with daughter and nephew; NOK: Daughter, [phone]  ? ? ?Chief Complaint: SOB ? ?HPI: Anne Roach is a 88 y.o. female with medical history significant of PAF on Eliquis; chronic diastolic CHF; DM; and HTN presenting with SOB. Symptoms started on 5/1.  She was having SOB and was brought to the ER for evaluation. She was ok and discharged about midnight.  Shortly thereafter they called EMS again because she was SOB again.  She was ok the next day and had another episode the next day so they brought her back to the ER and stayed until about 2AM.  Friday, her nephew called her home from work and they called EMS again.  She has periodic in-home nursing and she didn't want them to call 911 without her daughter there and they she refused transport.  In between, she has had periodic SOB and it self-corrects.  She only complains when it lasts >1-2 hours.  Last night, it happened again. She awoke her daughter about 0500 and she was having SOB.  Minimal cough.  Chronic and unchanged LE edema.  Weight has been stable.  She has been better with O2 via EMS and so they were thinking home O2 might help.  No recent medication changes. ? ? ? ?ER Course:  CHF, worsening pleural effusions.  Seen on 5/1, small B effusions.  Worsened since then.  89% on RA with EMS, placed on 3L but now weaned off at rest (86% with ambulation).  BNP is elevated to 450.  40 mg IV Lasix with good response.  Normal troponin, glucose 170.   ? ? ? ? ?Review of Systems: As mentioned in the history of present illness. All other systems reviewed and are negative. ?Past Medical History:  ?Diagnosis Date  ? Atrial fibrillation ([place])   ? Chronic diastolic CHF (congestive heart failure) ([place]) 10/31/2020  ? DM (diabetes mellitus) ([place])   ? HTN  (hypertension)   ? Tremor   ? ?Past Surgical History:  ?Procedure Laterality Date  ? APPENDECTOMY    ? CARPAL TUNNEL RELEASE    ? CATARACT EXTRACTION Bilateral   ? HERNIA REPAIR    ? ?Social History:  reports that she has never smoked. She has never used smokeless tobacco. She reports that she does not drink alcohol and does not use drugs. ? ?No Known Allergies ? ?Family History  ?Problem Relation Age of Onset  ? Diabetes Father   ? Asthma Brother   ? ? ?Prior to Admission medications   ?Medication Sig Start Date End Date Taking? Authorizing Provider  ?ACCU-CHEK AVIVA PLUS test strip  10/06/20   [provider]  ?acetaminophen (TYLENOL) 500 MG tablet Take 1,000 mg by mouth every 6 (six) hours as needed for mild pain or headache.    [provider]  ?allopurinol (ZYLOPRIM) 100 MG tablet TAKE 1 TABLET(100 MG) BY MOUTH DAILY 08/09/21   Edrick Kins, DPM  ?apixaban (ELIQUIS) 5 MG TABS tablet TAKE 1 TABLET(5 MG) BY MOUTH TWICE DAILY 01/22/22   Josue Hector, MD  ?cholecalciferol (VITAMIN D3) 25 MCG (1000 UNIT) tablet Take 1,000 Units by mouth daily with breakfast.    [provider]  ?colchicine 0.6 MG tablet Take 1 tablet (0.6 mg total) by mouth daily. 10/17/20   Edrick Kins,  DPM  ?diclofenac Sodium (VOLTAREN) 1 % GEL Apply 2 g topically 4 (four) times daily. 03/19/22   Deno Etienne, DO  ?diltiazem (CARDIZEM CD) 240 MG 24 hr capsule Take 1 capsule (240 mg total) by mouth daily. TAKE 1 CAPSULE(240 MG) BY MOUTH DAILY. PLEASE MAKE OVERDUE APPOINTMENT WITH DOCTOR [place] ANYMORE REFILLS. THANK YOU 3RD AND FINAL ATTEMPT 02/03/22   Josue Hector, MD  ?diltiazem (CARDIZEM) 30 MG tablet Take 1 tablet every 4 hours AS NEEDED for heart rate >100 as long as blood pressure >100. 02/19/21   Fenton, Clint R, PA  ?ferrous sulfate 325 (65 FE) MG tablet Take 1 tablet (325 mg total) by mouth daily. 03/17/22   Lucrezia Starch, MD  ?furosemide (LASIX) 40 MG tablet Take 1 tablet (40 mg total) by mouth daily.  11/05/20 11/05/21  Regalado, Jerald Kief A, MD  ?gabapentin (NEURONTIN) 100 MG capsule Take 1 capsule (100 mg total) by mouth 3 (three) times daily. 10/17/20   Edrick Kins, DPM  ?MAGNESIUM PO Take 1 tablet by mouth daily with lunch.    [provider]  ?metFORMIN (GLUCOPHAGE-XR) 750 MG 24 hr tablet Take 750 mg by mouth at bedtime. 05/14/15   [provider]  ?Multiple Vitamins-Minerals (ICAPS AREDS 2 PO) Take 1 capsule by mouth 2 (two) times daily.    [provider]  ?potassium chloride SA (KLOR-CON) 20 MEQ tablet TAKE 2 TABLETS(40 MEQ) BY MOUTH DAILY 01/24/21   Hunsucker, Bonna Gains, MD  ? ? ?Physical Exam: ?Vitals:  ? 03/27/22 0817 03/27/22 0900 03/27/22 0915 03/27/22 0930  ?BP:  125/89 (!) 143/117 117/73  ?Pulse:  93 (!) 131 (!) 125  ?Resp:  (!) '21 20 18  '$ ?SpO2:  96% 97% 95%  ?Weight: 75 kg     ?Height: '5\' 5"'$  (1.651 m)     ? ?General:  Appears calm and comfortable and is in NAD, sleeping on RA at the time of the history ?Eyes: EOMI, normal lids, iris ?ENT:  grossly normal hearing, lips & tongue, mmm ?Neck:  no LAD, masses or thyromegaly ?Cardiovascular:  Irregularly irregular but rate controlled, no m/r/g. 1-2+ LE edema.  ?Respiratory:   CTA bilaterally with no wheezes/rales/rhonchi.  Normal respiratory effort. ?Abdomen:  soft, NT, ND ?Skin:  no rash or induration seen on limited exam ?Musculoskeletal:  grossly normal tone BUE/BLE, good ROM, no bony abnormality ?Psychiatric:  grossly normal mood and affect, speech fluent and appropriate, AOx3 ?Neurologic:  CN 2-12 grossly intact, moves all extremities in coordinated fashion; chronic/stable mouth tremor noted ? ? ?Radiological Exams on Admission: ?Independently reviewed - see discussion in A/P where applicable ? ?DG Chest 2 View ? ?Result Date: 03/27/2022 ?CLINICAL DATA:  Shortness of breath for 2 weeks EXAM: CHEST - 2 VIEW COMPARISON:  Chest radiograph and chest CT 03/19/2022 FINDINGS: Heart is enlarged, unchanged. The upper mediastinal  contours are stable. There are bilateral pleural effusions with adjacent bibasilar airspace opacities, overall slightly worsened since 03/19/2022. There is vascular congestion without definite overt pulmonary edema. There is no pneumothorax There is no acute osseous abnormality. IMPRESSION: Bilateral pleural effusions with adjacent bibasilar airspace opacities, worsened since 03/19/2022. Electronically Signed   By: Valetta Mole M.D.   On: 03/27/2022 09:03   ? ?EKG: Independently reviewed.  Afib with rate 95; nonspecific ST changes with no evidence of acute ischemia ? ? ?Labs on Admission: I have personally reviewed the available labs and imaging studies at the time of the admission. ? ?Pertinent labs:   ? ?  Glucose 170 ?BUN 12/Creatinine 0.98/GFR 56 - stable ?BNP 449.7; 327.1 on 5/3 ?HS troponin 7, 7 ?WBC 7.9 ?Hgb 9.3 - stable ? ? ?Assessment and Plan: ?Principal Problem: ?  Acute on chronic diastolic CHF (congestive heart failure) ([place]) ?Active Problems: ?  Type 2 diabetes mellitus ([place]) ?  Chronic kidney disease (CKD), stage III (moderate) ?  Permanent atrial fibrillation ([place]) ?  Chronic pain ?  ? ?Acute on chronic diastolic CHF ?-Patient with known h/o chronic diastolic CHF presenting with worsening SOB and hypoxia ?-This is her 3rd ER visit since 5/1 for the same issue ?-CXR consistent with worsening pleural effusions and vascular congestion with worsening airspace opacities, likely related to pulmonary edema ?-Mildly elevated BNP compared to prior ?-With elevated BNP and abnl CXR, acute decompensated CHF seems probable as diagnosis ?-Will admit, as per the Emergency HF Mortality Risk Grade.  The patient has: severe pulmonary edema requiring new O2 therapy with ambulation and recurrent ER visits for this issue with failure of outpatient management ?-Will request echocardiogram ?-CHF order set utilized ?-Cardiology consulted ?-Was given Lasix 40 mg x 1 in ER and will repeat with 40 mg IV BID ?-Continue [place] O2 for  now if needed; will order ambulatory pulse ox for tomorrow am ?-Normal kidney function at this time, will follow ? ?Afib ?-Rate controlled with Cardizem ?-Continue Eliquis ? ?DM ?-Will check A1c ?-hold Glucophage

## 2022-03-27 NOTE — Consult Note (Addendum)
?Cardiology Consultation:  ? ?Patient ID: Anne Roach ?MRN: [id number]; DOB: Apr 01, 1933 ? ?Admit date: 03/27/2022 ?Date of Consult: 03/27/2022 ? ?PCP:  Lin Landsman, MD ?  ?[facility] Providers ?Cardiologist:  Jenkins Rouge, MD   { ? ? ?Patient Profile:  ? ?Anne Roach is a 88 y.o. female with a hx of hypertension, type 2 diabetes, OSA, permanent atrial fibrillation, chronic diastolic heart failure, gout, who is being seen 03/27/2022 for the evaluation of CHF at the request of Dr. Lorin Mercy. ? ?History of Present Illness:  ? ?Anne Roach with above past medical history presented to the ER today with complaints of shortness of breath. ? ?Patient follows cardiology Dr. Johnsie Cancel and A fib clinic outpatient, suffers permanent atrial fibrillation since 12/2018, remains largely asymptomatic.  She is historically maintained on Eliquis due to CHA2DS2-VASc score of 5 and rate controlled diltiazem '240mg'$ . Last Echo from 11/01/2020 with LVEF 50 to 55%, low normal systolic function, no RWMA, mild LVH, unable to assess diastolic function.  Moderate reduced RV systolic function, mild elevated PASP with RVSP 35.23mHg, severe LAE, mild dilated RA, small pericardial effusion.  Moderate MR.  Moderate TR.  Mild AI.  Mild aortic sclerosis. ? ?Patient presented to the ER today complaining of progressively worsening shortness of breath that started on 03/17/2022.  She had a recent 2 ER visit for heart palpitation and shortness of breath. On 03/19/22 visit Anne BNP was around 300, troponin was negative.  CT chest showed bilateral pleural effusions R>L and cardiomegaly.  She was discharged to home with plan to follow-up with PCP.  She has not seen Anne PCP yet. Last night, she developed worsening of shortness of breath where family called EMS.  She was found to be hypoxic with pulse ox dropping to 89% on room air and required 3 L nasal cannula oxygen in route to ED. She states Anne legs and feet had been swelling. She noted SOB with walking short  distance. She denied any chest pain, dizziness, or resting SOB. She had urinated 3 times so far at ER. Anne daughter states Anne heart rate was little fast on Wednesday. She drinks 4 bottles of water daily, denied consume high sodium/salty diet.  ? ?Admission diagnostic so far revealed glucose 170, otherwise unremarkable BMP.  BNP elevated 449.  Hs troponin negative x2.  CBC with hemoglobin 9.3. CXR showed bilateral pleural effusions with adjacent bibasilar airspace opacities, worsened since 03/19/2022.  EKG revealed atrial fibrillation 95 bpm, nonspecific T wave abnormality.  She is tachycardic with heart rate up to 130s and hypertensive with blood pressure 148/59 at ED. She is subsequently admitted to hospital medicine service, started on IV Lasix 40 mg twice daily.  Cardiology's consult requested for further input. ?  ? ? ? ?Past Medical History:  ?Diagnosis Date  ? [place]   ? Chronic diastolic CHF (congestive heart failure) ([place] 10/31/2020  ? DM (diabetes mellitus) ([place]   ? HTN (hypertension)   ? Tremor   ? ? ?Past Surgical History:  ?Procedure Laterality Date  ? APPENDECTOMY    ? CARPAL TUNNEL RELEASE    ? CATARACT EXTRACTION Bilateral   ? HERNIA REPAIR    ?  ? ?Home Medications:  ?Prior to Admission medications   ?Medication Sig Start Date End Date Taking? Authorizing Provider  ?acetaminophen (TYLENOL) 500 MG tablet Take 1,000 mg by mouth every 6 (six) hours as needed for mild pain or headache.   Yes [provider]  ?allopurinol (ZYLOPRIM) 100 MG  tablet TAKE 1 TABLET(100 MG) BY MOUTH DAILY ?Patient taking differently: Take 100 mg by mouth daily. 08/09/21  Yes Edrick Kins, DPM  ?apixaban (ELIQUIS) 5 MG TABS tablet TAKE 1 TABLET(5 MG) BY MOUTH TWICE DAILY ?Patient taking differently: Take 5 mg by mouth 2 (two) times daily. 01/22/22  Yes Josue Hector, MD  ?cholecalciferol (VITAMIN D3) 25 MCG (1000 UNIT) tablet Take 1,000 Units by mouth daily with breakfast.   Yes [provider]  ?diclofenac Sodium (VOLTAREN) 1 % GEL Apply 2 g topically 4 (four) times daily. ?Patient taking differently: Apply 2 g topically 2 (two) times daily as needed (pain). 03/19/22  Yes Deno Etienne, DO  ?diltiazem (CARDIZEM CD) 240 MG 24 hr capsule Take 1 capsule (240 mg total) by mouth daily. TAKE 1 CAPSULE(240 MG) BY MOUTH DAILY. PLEASE MAKE OVERDUE APPOINTMENT WITH DOCTOR [place] ANYMORE REFILLS. THANK YOU 3RD AND FINAL ATTEMPT ?Patient taking differently: Take 240 mg by mouth daily. 02/03/22  Yes Josue Hector, MD  ?diltiazem (CARDIZEM) 30 MG tablet Take 1 tablet every 4 hours AS NEEDED for heart rate >100 as long as blood pressure >100. 02/19/21  Yes Fenton, Clint R, PA  ?ferrous sulfate 325 (65 FE) MG tablet Take 1 tablet (325 mg total) by mouth daily. 03/17/22  Yes Lucrezia Starch, MD  ?folic acid (FOLVITE) 295 MCG tablet Take 800 mcg by mouth daily.   Yes [provider]  ?furosemide (LASIX) 40 MG tablet Take 1 tablet (40 mg total) by mouth daily. 11/05/20 03/27/22 Yes Regalado, Belkys A, MD  ?gabapentin (NEURONTIN) 400 MG capsule Take 400 mg by mouth 3 (three) times daily. 03/25/22  Yes [provider]  ?MAGNESIUM PO Take 1 tablet by mouth daily with lunch.   Yes [provider]  ?metFORMIN (GLUCOPHAGE-XR) 750 MG 24 hr tablet Take 750 mg by mouth at bedtime. 05/14/15  Yes [provider]  ?Multiple Vitamins-Minerals (ICAPS AREDS 2 PO) Take 1 capsule by mouth 2 (two) times daily.   Yes [provider]  ?Potassium Chloride ER 20 MEQ TBCR Take 20 mEq by mouth daily. 02/20/22  Yes [provider]  ?traMADol (ULTRAM) 50 MG tablet Take 50 mg by mouth daily as needed for pain. 01/23/22  Yes [provider]  ?[place] test strip  10/06/20   [provider]  ? ? ?Inpatient Medications: ?Scheduled Meds: ? [START ON 03/28/2022] allopurinol  100 mg Oral Daily  ? apixaban  5 mg Oral BID  ? betamethasone acetate-betamethasone sodium  phosphate  3 mg Intramuscular Once  ? diltiazem  240 mg Oral Daily  ? docusate sodium  100 mg Oral BID  ? folic acid  1,884 mcg Oral Daily  ? furosemide  40 mg Intravenous BID  ? gabapentin  400 mg Oral TID  ? insulin aspart  0-15 Units Subcutaneous TID WC  ? insulin aspart  0-5 Units Subcutaneous QHS  ? sodium chloride flush  3 mL Intravenous Q12H  ? ?Continuous Infusions: ? ?PRN Meds: ?acetaminophen **OR** acetaminophen, bisacodyl, hydrALAZINE, ondansetron **OR** ondansetron (ZOFRAN) IV, oxyCODONE, polyethylene glycol, traMADol, traZODone ? ?Allergies:   No Known Allergies ? ?Social History:   ?Social History  ? ?Socioeconomic History  ? Marital status: Widowed  ?  Spouse name: Not on file  ? Number of children: 3  ? Years of education: 14  ? Highest education level: Not on file  ?Occupational History  ?  Comment: Retired  ?Tobacco Use  ? Smoking  status: Never  ? Smokeless tobacco: Never  ?Substance and Sexual Activity  ? Alcohol use: No  ?  Alcohol/week: 0.0 standard drinks  ? Drug use: No  ? Sexual activity: Not on file  ?Other Topics Concern  ? Not on file  ?Social History Narrative  ? Speaks English and Arabic. Patient lives at home with Anne Roach). Patient is widowed.  ? Patient is retired.  ? Education high school.  ? Right handed.  ? Two children.  ? Caffeine None  ? ?Social Determinants of Health  ? ?Financial Resource Strain: Not on file  ?Food Insecurity: Not on file  ?Transportation Needs: Not on file  ?Physical Activity: Not on file  ?Stress: Not on file  ?Social Connections: Not on file  ?Intimate Partner Violence: Not on file  ?  ?Family History:   ? ?Family History  ?Problem Relation Age of Onset  ? Diabetes Father   ? Asthma Brother   ?  ? ?ROS:  ? Constitutional: Denied fever, chills, malaise, night sweats ?Eyes: Denied vision change or loss ?Ears/Nose/Mouth/Throat: Denied ear ache, sore throat, coughing, sinus pain ?Cardiovascular: see HPI  ?Respiratory: see HPI  ?Gastrointestinal:  Denied nausea, vomiting, abdominal pain, diarrhea ?Genital/Urinary: Denied dysuria, hematuria, urinary frequency/urgency ?Musculoskeletal: Denied muscle ache, joint pain, weakness ?Skin: Denied rash, wound ?N

## 2022-03-27 NOTE — Evaluation (Signed)
Occupational Therapy Evaluation ?Patient Details ?Name: Anne Roach ?MRN: [id number] ?DOB: 13-Jan-1933 ?Today's Date: 03/27/2022 ? ? ?History of Present Illness 88 y.o. female with medical history significant of PAF on Eliquis; chronic diastolic CHF; DM; and HTN presenting with SOB.  ? ?Clinical Impression ?  ?Patient admitted for the diagnosis above.  PTA she lives at home with her daughter, who does work outside of the home.  Typically she walk household distance without an AD, and completes her own ADL from sit/stand level.  The patient was receiving [facility] PT, and would like to continue that upon returning home.  OT can follow in the acute setting, and acute OT can defer to [facility] PT with respect to [facility] OT is needed.  PT eval is pending.    ?   ? ?Recommendations for follow up therapy are one component of a multi-disciplinary discharge planning process, led by the attending physician.  Recommendations may be updated based on patient status, additional functional criteria and insurance authorization.  ? ?Follow Up Recommendations ? No OT follow up  ?  ?Assistance Recommended at Discharge Intermittent Supervision/Assistance  ?Patient can return home with the following Assist for transportation;Assistance with cooking/housework;Direct supervision/assist for medications management ? ?  ?Functional Status Assessment ? Patient has had a recent decline in their functional status and demonstrates the ability to make significant improvements in function in a reasonable and predictable amount of time.  ?Equipment Recommendations ? None recommended by OT  ?  ?Recommendations for Other Services   ? ? ?  ?Precautions / Restrictions Precautions ?Precautions: Fall ?Precaution Comments: Watch O2 sats and HR ?Restrictions ?Weight Bearing Restrictions: No  ? ?  ? ?Mobility Bed Mobility ?Overal bed mobility: Needs Assistance ?Bed Mobility: Supine to Sit, Sit to Supine ?  ?  ?Supine to sit: Supervision ?Sit to supine: Supervision ?  ?  ?   ? ?Transfers ?Overall transfer level: Needs assistance ?Equipment used: 1 person hand held assist ?Transfers: Sit to/from Stand ?Sit to Stand: Supervision ?  ?  ?  ?  ?  ?  ?  ? ?  ?Balance Overall balance assessment: Needs assistance ?Sitting-balance support: Feet supported ?Sitting balance-Leahy Scale: Good ?  ?  ?Standing balance support: Single extremity supported ?Standing balance-Leahy Scale: Fair ?Standing balance comment: decreasing balance with increased SOB - initially supervision transitioning to one person HHA ?  ?  ?  ?  ?  ?  ?  ?  ?  ?  ?  ?   ? ?ADL either performed or assessed with clinical judgement  ? ?ADL Overall ADL's : Needs assistance/impaired ?Eating/Feeding: Independent;Sitting ?  ?Grooming: Wash/dry hands;Supervision/safety;Standing ?  ?  ?  ?  ?  ?Upper Body Dressing : Standing;Minimal assistance ?  ?Lower Body Dressing: Minimal assistance;Sit to/from stand ?  ?Toilet Transfer: Min guard;Ambulation;Regular Toilet ?  ?  ?  ?  ?  ?Functional mobility during ADLs: Min guard ?   ? ? ? ?Vision Baseline Vision/History: 1 Wears glasses ?Patient Visual Report: No change from baseline ?   ?   ?Perception Perception ?Perception: Not tested ?  ?Praxis Praxis ?Praxis: Not tested ?  ? ?Pertinent Vitals/Pain Pain Assessment ?Pain Assessment: Faces ?Faces Pain Scale: Hurts a little bit ?Pain Location: ankles and knees - chronic ?Pain Descriptors / Indicators: Aching ?Pain Intervention(s): Monitored during session  ? ? ? ?Hand Dominance Right ?  ?Extremity/Trunk Assessment Upper Extremity Assessment ?Upper Extremity Assessment: Overall WFL for tasks assessed ?  ?Lower Extremity Assessment ?  Lower Extremity Assessment: Defer to PT evaluation ?  ?Cervical / Trunk Assessment ?Cervical / Trunk Assessment: Kyphotic ?  ?Communication Communication ?Communication: No difficulties ?  ?Cognition Arousal/Alertness: Awake/alert ?Behavior During Therapy: [facility] for tasks assessed/performed ?Overall Cognitive Status:  Within Functional Limits for tasks assessed ?  ?  ?  ?  ?  ?  ?  ?  ?  ?  ?  ?  ?  ?  ?  ?  ?General Comments: daughter will help to translate for her, but she does speak english ?  ?  ?General Comments   Patient O2 sats remained steady > 92% on RA despite HR 124 and SOB.  HR down to 94 after 2 min.   ? ?  ?Exercises   ?  ?Shoulder Instructions    ? ? ?Home Living Family/patient expects to be discharged to:: Private residence ?Living Arrangements: Children ?Available Help at Discharge: Family;Available PRN/intermittently (Daughter work at [employer], on summer break, but still works PT job as well.) ?Type of Home: House ?Home Access: Level entry ?  ?  ?Home Layout: Multi-level;Able to live on main level with bedroom/bathroom ?  ?  ?Bathroom Shower/Tub: Walk-in shower ?  ?Bathroom Toilet: Handicapped height ?Bathroom Accessibility: Yes ?  ?Home Equipment: Rolling Walker (2 wheels);Grab bars - tub/shower;Shower seat - built in;BSC/3in1 ?  ?Additional Comments: does not use RW ?  ? ?  ?Prior Functioning/Environment Prior Level of Function : Needs assist ?  ?  ?  ?  ?  ?  ?Mobility Comments: Walks household distances without AD ?ADLs Comments: Completes her own ADL, and participates with light IADL at times.  Daughter assists with med and community mobility. ?  ? ?  ?  ?OT Problem List: Decreased activity tolerance;Impaired balance (sitting and/or standing) ?  ?   ?OT Treatment/Interventions: Self-care/ADL training;Therapeutic activities;Balance training;Energy conservation;Therapeutic exercise  ?  ?OT Goals(Current goals can be found in the care plan section) Acute Rehab OT Goals ?Patient Stated Goal: Breath better ?OT Goal Formulation: With patient ?Time For Goal Achievement: 04/10/22 ?Potential to Achieve Goals: Good ?ADL Goals ?Pt Will Perform Grooming: with modified independence;standing ?Pt Will Perform Lower Body Dressing: with modified independence;sit to/from stand ?Pt Will Transfer to Toilet: with modified  independence;ambulating;regular height toilet ?Pt/caregiver will Perform Home Exercise Program: Increased strength;Both right and left upper extremity;With theraband;With Supervision  ?OT Frequency: Min 2X/week ?  ? ?Co-evaluation   ?  ?  ?  ?  ? ?  ?AM-PAC OT "6 Clicks" Daily Activity     ?Outcome Measure Help from another person eating meals?: None ?Help from another person taking care of personal grooming?: None ?Help from another person toileting, which includes using toliet, bedpan, or urinal?: A Little ?Help from another person bathing (including washing, rinsing, drying)?: A Little ?Help from another person to put on and taking off regular upper body clothing?: A Little ?Help from another person to put on and taking off regular lower body clothing?: A Little ?6 Click Score: 20 ?  ?End of Session Equipment Utilized During Treatment: Gait belt ?Nurse Communication: Mobility status ? ?Activity Tolerance: Patient limited by fatigue ?Patient left: in bed;with call bell/phone within reach;with family/visitor present ? ?OT Visit Diagnosis: Unsteadiness on feet (R26.81)  ?              ?Time: 1749-4496 ?OT Time Calculation (min): 25 min ?Charges:  OT General Charges ?$OT Visit: 1 Visit ?OT Evaluation ?$OT Eval Moderate Complexity: 1 Mod ?OT Treatments ?$Therapeutic Activity: 8-22  mins ? ?03/27/2022 ? ?RP, OTR/L ? ?Acute Rehabilitation Services ? ?Office:  [phone] ? ? ?Lempi Edwin D Trayonna Bachmeier ?03/27/2022, 4:41 PM ?

## 2022-03-27 NOTE — Progress Notes (Signed)
?  Echocardiogram ?2D Echocardiogram has been performed. ? ?Johny Chess ?03/27/2022, 3:28 PM ?

## 2022-03-27 NOTE — ED Triage Notes (Signed)
Pt to ER via EMS from home with reports of increasing SHOB.  Pt has significant history of CHF and symptoms today are same.  Pt arrives on 3L [place], RA sats on EMS arrival were 89%.   ?

## 2022-03-27 NOTE — ED Notes (Signed)
Echo in room

## 2022-03-27 NOTE — ED Provider Notes (Signed)
?[facility] ?Provider Note ? ? ?CSN: [id number] ?Arrival date & time: 03/27/22  0810 ? ?  ? ?History ? ?Chief Complaint  ?Patient presents with  ? Shortness of Breath  ? ? ?Anne Roach is a 88 y.o. female with medical history of CHF on '40mg'$  lasix QD, A-fib on Eliquis and Cardizem, diabetes, hypertension.  Patient presents ED for evaluation of shortness of breath.  The patient states that last night she began developing shortness of breath that lasted into this morning prompting her to call EMS.  Per triage note, patient oxygen saturation was 89% on room air before EMS placed her on 3 L of oxygen via nasal cannula.  Patient reports after being placed on oxygen she feels she can breathe much better.  Patient denies using home oxygen.  Patient states that she lives at home with her daughter.  Patient reports that she has been compliant on home diuretic medication, states she has not missed any doses.  Patient continues that the shortness of breath is worse when she lies back when attempting to sleep.  Patient denies any recent weight gain, swelling of lower extremities beyond her baseline.  Patient reports that her legs are always swollen.  Patient denies any chest pain. Patient reports that she was seen in the ER on 5/3 for the same complaint.  Patient also endorsing nausea that she began experiencing this morning. ? ? ?Shortness of Breath ?Associated symptoms: no chest pain and no vomiting   ? ?  ? ?Home Medications ?Prior to Admission medications   ?Medication Sig Start Date End Date Taking? Authorizing Provider  ?ACCU-CHEK AVIVA PLUS test strip  10/06/20   [provider]  ?acetaminophen (TYLENOL) 500 MG tablet Take 1,000 mg by mouth every 6 (six) hours as needed for mild pain or headache.    [provider]  ?allopurinol (ZYLOPRIM) 100 MG tablet TAKE 1 TABLET(100 MG) BY MOUTH DAILY 08/09/21   Edrick Kins, DPM  ?apixaban (ELIQUIS) 5 MG TABS tablet TAKE  1 TABLET(5 MG) BY MOUTH TWICE DAILY 01/22/22   Josue Hector, MD  ?cholecalciferol (VITAMIN D3) 25 MCG (1000 UNIT) tablet Take 1,000 Units by mouth daily with breakfast.    [provider]  ?colchicine 0.6 MG tablet Take 1 tablet (0.6 mg total) by mouth daily. 10/17/20   Edrick Kins, DPM  ?diclofenac Sodium (VOLTAREN) 1 % GEL Apply 2 g topically 4 (four) times daily. 03/19/22   Deno Etienne, DO  ?diltiazem (CARDIZEM CD) 240 MG 24 hr capsule Take 1 capsule (240 mg total) by mouth daily. TAKE 1 CAPSULE(240 MG) BY MOUTH DAILY. PLEASE MAKE OVERDUE APPOINTMENT WITH DOCTOR [place] ANYMORE REFILLS. THANK YOU 3RD AND FINAL ATTEMPT 02/03/22   Josue Hector, MD  ?diltiazem (CARDIZEM) 30 MG tablet Take 1 tablet every 4 hours AS NEEDED for heart rate >100 as long as blood pressure >100. 02/19/21   Fenton, Clint R, PA  ?ferrous sulfate 325 (65 FE) MG tablet Take 1 tablet (325 mg total) by mouth daily. 03/17/22   Lucrezia Starch, MD  ?furosemide (LASIX) 40 MG tablet Take 1 tablet (40 mg total) by mouth daily. 11/05/20 11/05/21  Regalado, Jerald Kief A, MD  ?gabapentin (NEURONTIN) 100 MG capsule Take 1 capsule (100 mg total) by mouth 3 (three) times daily. 10/17/20   Edrick Kins, DPM  ?MAGNESIUM PO Take 1 tablet by mouth daily with lunch.    [provider]  ?metFORMIN (GLUCOPHAGE-XR) 750  MG 24 hr tablet Take 750 mg by mouth at bedtime. 05/14/15   [provider]  ?Multiple Vitamins-Minerals (ICAPS AREDS 2 PO) Take 1 capsule by mouth 2 (two) times daily.    [provider]  ?potassium chloride SA (KLOR-CON) 20 MEQ tablet TAKE 2 TABLETS(40 MEQ) BY MOUTH DAILY 01/24/21   Hunsucker, Bonna Gains, MD  ?   ? ?Allergies    ?Patient has no known allergies.   ? ?Review of Systems   ?Review of Systems  ?Respiratory:  Positive for shortness of breath.   ?Cardiovascular:  Negative for chest pain and leg swelling.  ?     Patient states that bilateral ankle swelling is her baseline.  ?Gastrointestinal:  Positive  for nausea. Negative for vomiting.  ?All other systems reviewed and are negative. ? ?Physical Exam ?Updated Vital Signs ?BP 117/73   Pulse (!) 125   Resp 18   Ht '5\' 5"'$  (1.651 m)   Wt 75 kg   SpO2 95%   BMI 27.51 kg/m?  ?Physical Exam ?Vitals and nursing note reviewed.  ?Constitutional:   ?   General: She is not in acute distress. ?   Appearance: She is well-developed. She is not ill-appearing.  ?HENT:  ?   Head: Normocephalic and atraumatic.  ?   Mouth/Throat:  ?   Mouth: Mucous membranes are moist.  ?   Pharynx: Oropharynx is clear.  ?Eyes:  ?   Extraocular Movements: Extraocular movements intact.  ?   Pupils: Pupils are equal, round, and reactive to light.  ?Cardiovascular:  ?   Rate and Rhythm: Tachycardia present. Rhythm irregular.  ?Pulmonary:  ?   Effort: Pulmonary effort is normal.  ?   Breath sounds: Examination of the right-lower field reveals rales. Examination of the left-lower field reveals rales. Rales present. No wheezing.  ?Abdominal:  ?   General: Bowel sounds are normal.  ?   Palpations: Abdomen is soft.  ?   Tenderness: There is no abdominal tenderness.  ?Musculoskeletal:  ?   Right lower leg: Edema present.  ?   Left lower leg: Edema present.  ?   Comments: There is no pitting edema noted.  ?Skin: ?   General: Skin is warm and dry.  ?   Capillary Refill: Capillary refill takes less than 2 seconds.  ?Neurological:  ?   Mental Status: She is alert and oriented to person, place, and time.  ? ? ?ED Results / Procedures / Treatments   ?Labs ?(all labs ordered are listed, but only abnormal results are displayed) ?Labs Reviewed  ?BASIC METABOLIC PANEL - Abnormal; Notable for the following components:  ?    Result Value  ? Glucose, Bld 170 (*)   ? Calcium 8.6 (*)   ? GFR, Estimated 56 (*)   ? All other components within normal limits  ?CBC WITH DIFFERENTIAL/PLATELET - Abnormal; Notable for the following components:  ? RBC 3.47 (*)   ? Hemoglobin 9.3 (*)   ? HCT 31.6 (*)   ? MCHC 29.4 (*)   ? RDW  19.0 (*)   ? All other components within normal limits  ?BRAIN NATRIURETIC PEPTIDE - Abnormal; Notable for the following components:  ? B Natriuretic Peptide 449.7 (*)   ? All other components within normal limits  ?TROPONIN I (HIGH SENSITIVITY)  ?TROPONIN I (HIGH SENSITIVITY)  ? ? ?EKG ?None ? ?Radiology ?DG Chest 2 View ? ?Result Date: 03/27/2022 ?CLINICAL DATA:  Shortness of breath for 2 weeks EXAM: CHEST -  2 VIEW COMPARISON:  Chest radiograph and chest CT 03/19/2022 FINDINGS: Heart is enlarged, unchanged. The upper mediastinal contours are stable. There are bilateral pleural effusions with adjacent bibasilar airspace opacities, overall slightly worsened since 03/19/2022. There is vascular congestion without definite overt pulmonary edema. There is no pneumothorax There is no acute osseous abnormality. IMPRESSION: Bilateral pleural effusions with adjacent bibasilar airspace opacities, worsened since 03/19/2022. Electronically Signed   By: Valetta Mole M.D.   On: 03/27/2022 09:03   ? ?Procedures ?Procedures  ? ?Medications Ordered in ED ?Medications  ?furosemide (LASIX) injection 40 mg (40 mg Intravenous Given 03/27/22 0908)  ?ondansetron [facility]) injection 4 mg (4 mg Intravenous Given 03/27/22 0907)  ? ? ?ED Course/ Medical Decision Making/ A&P ?  ?                        ?Medical Decision Making ?Amount and/or Complexity of Data Reviewed ?Labs: ordered. ?Radiology: ordered. ? ?Risk ?Prescription drug management. ? ? ?88 year old female presents ED for evaluation of shortness of breath.  Please see HPI for further details. ? ?On examination, the patient is afebrile.  The patient is tachycardic up to 125 with an irregular beat noted on the monitor.  Patient appears to be in A-fib.  Patient has history of A-fib, currently on anticoagulation as well as Cardizem.  Patient lung sounds have Rales noted bilaterally in the lower lung fields.  Patient oxygen saturation noted to be 96% on 3 L of oxygen.  Patient abdomen soft  compressible all 4 quadrants.  Patient alert and orient x4.  Patient follows commands.  Patient nontoxic in appearance.  No acute distress. ? ?Patient worked up utilizing the following labs imaging studies in

## 2022-03-28 ENCOUNTER — Telehealth (HOSPITAL_COMMUNITY): Payer: Self-pay | Admitting: Cardiovascular Disease

## 2022-03-28 ENCOUNTER — Encounter (HOSPITAL_COMMUNITY): Payer: Self-pay | Admitting: Internal Medicine

## 2022-03-28 DIAGNOSIS — I4821 Permanent atrial fibrillation: Secondary | ICD-10-CM | POA: Diagnosis not present

## 2022-03-28 DIAGNOSIS — I5033 Acute on chronic diastolic (congestive) heart failure: Secondary | ICD-10-CM | POA: Diagnosis not present

## 2022-03-28 LAB — GLUCOSE, CAPILLARY
Glucose-Capillary: 118 mg/dL — ABNORMAL HIGH (ref 70–99)
Glucose-Capillary: 210 mg/dL — ABNORMAL HIGH (ref 70–99)
Glucose-Capillary: 108 mg/dL — ABNORMAL HIGH (ref 70–99)
Glucose-Capillary: 136 mg/dL — ABNORMAL HIGH (ref 70–99)

## 2022-03-28 LAB — CBC
HCT: 29.5 % — ABNORMAL LOW (ref 36.0–46.0)
Hemoglobin: 8.8 g/dL — ABNORMAL LOW (ref 12.0–15.0)
MCH: 27.1 pg (ref 26.0–34.0)
MCHC: 29.8 g/dL — ABNORMAL LOW (ref 30.0–36.0)
MCV: 90.8 fL (ref 80.0–100.0)
Platelets: 233 10*3/uL (ref 150–400)
RBC: 3.25 MIL/uL — ABNORMAL LOW (ref 3.87–5.11)
RDW: 18.8 % — ABNORMAL HIGH (ref 11.5–15.5)
WBC: 6.9 10*3/uL (ref 4.0–10.5)
nRBC: 0 % (ref 0.0–0.2)

## 2022-03-28 LAB — IRON AND TIBC
Iron: 26 ug/dL — ABNORMAL LOW (ref 28–170)
Saturation Ratios: 7 % — ABNORMAL LOW (ref 10.4–31.8)
TIBC: 371 ug/dL (ref 250–450)
UIBC: 345 ug/dL

## 2022-03-28 LAB — RETICULOCYTES
Immature Retic Fract: 35.1 % — ABNORMAL HIGH (ref 2.3–15.9)
RBC.: 3.23 MIL/uL — ABNORMAL LOW (ref 3.87–5.11)
Retic Count, Absolute: 115.6 10*3/uL (ref 19.0–186.0)
Retic Ct Pct: 3.6 % — ABNORMAL HIGH (ref 0.4–3.1)

## 2022-03-28 LAB — BASIC METABOLIC PANEL
Anion gap: 11 (ref 5–15)
BUN: 13 mg/dL (ref 8–23)
CO2: 24 mmol/L (ref 22–32)
Calcium: 8.3 mg/dL — ABNORMAL LOW (ref 8.9–10.3)
Chloride: 102 mmol/L (ref 98–111)
Creatinine, Ser: 1.11 mg/dL — ABNORMAL HIGH (ref 0.44–1.00)
GFR, Estimated: 48 mL/min — ABNORMAL LOW (ref >60)
Glucose, Bld: 100 mg/dL — ABNORMAL HIGH (ref 70–99)
Potassium: 3.7 mmol/L (ref 3.5–5.1)
Sodium: 137 mmol/L (ref 135–145)

## 2022-03-28 LAB — FOLATE: Folate: >40 ng/mL (ref >5.9)

## 2022-03-28 LAB — VITAMIN B12: Vitamin B-12: 335 pg/mL (ref 180–914)

## 2022-03-28 LAB — FERRITIN: Ferritin: 24 ng/mL (ref 11–307)

## 2022-03-28 MED ORDER — NYSTATIN 100000 UNIT/GM EX POWD
Freq: Two times a day (BID) | CUTANEOUS | Status: DC
  Filled 2022-03-28: qty 15

## 2022-03-28 MED ORDER — POTASSIUM CHLORIDE CRYS ER 20 MEQ PO TBCR
40.0000 meq | EXTENDED_RELEASE_TABLET | Freq: Every day | ORAL | Status: DC
  Administered 2022-03-28: 40 meq via ORAL
  Filled 2022-03-28: qty 2

## 2022-03-28 MED ORDER — ALUM & MAG HYDROXIDE-SIMETH 200-200-20 MG/5ML PO SUSP
30.0000 mL | ORAL | Status: DC | PRN
Start: 2022-03-28 — End: 2022-04-02
  Administered 2022-03-28: 30 mL via ORAL
  Filled 2022-03-28: qty 30

## 2022-03-28 MED ORDER — ENSURE ENLIVE PO LIQD
237.0000 mL | Freq: Every day | ORAL | Status: DC
  Administered 2022-03-28 – 2022-04-02 (×6): 237 mL via ORAL

## 2022-03-28 MED ORDER — SPIRONOLACTONE 25 MG PO TABS
25.0000 mg | ORAL_TABLET | Freq: Every day | ORAL | Status: DC
  Administered 2022-03-28 – 2022-03-31 (×4): 25 mg via ORAL
  Filled 2022-03-28 (×4): qty 1

## 2022-03-28 NOTE — Progress Notes (Signed)
Heart Failure Nurse Navigator Progress Note ? ?PCP: Lin Landsman, MD ?PCP-Cardiologist: Johnsie Cancel ?Admission Diagnosis: shortness of breath, Anemia ?Admitted from: Home via EMS ? ?Presentation:   ?Anne Roach presented with Shortness of breath, some chest discomfort, BP 149/94, 99 % on O2, BNP 399, anemia. Hgb 9.8, IV lasix given and admitted.  ? ?Patient was interviewed at bedside. Her daughter was present, patient speaks Arabic, however understands and speaks english well. Education was done with the signs and symptoms of heart failure, daily weights, when to call her doctor or go to the ER. Spoke about diet/ fluid restrictions, taking all her medications as prescribed, and keeping her appointments. Both patient and daughter are interested in coming to the HF TOC follow up on 04/02/2022 @ 12 noon.  ? ?ECHO/ LVEF: 50-55% ? ?Clinical Course: ? ?Past Medical History:  ?Diagnosis Date  ? Atrial fibrillation ([place])   ? Chronic diastolic CHF (congestive heart failure) ([place]) 10/31/2020  ? DM (diabetes mellitus) ([place])   ? HTN (hypertension)   ? Tremor   ?  ? ?Social History  ? ?Socioeconomic History  ? Marital status: Widowed  ?  Spouse name: Not on file  ? Number of children: 2  ? Years of education: 18  ? Highest education level: High school graduate  ?Occupational History  ?  Comment: Retired  ?  Comment: retired  ?Tobacco Use  ? Smoking status: Never  ? Smokeless tobacco: Never  ?Vaping Use  ? Vaping Use: Never used  ?Substance and Sexual Activity  ? Alcohol use: No  ?  Alcohol/week: 0.0 standard drinks  ? Drug use: No  ? Sexual activity: Not on file  ?Other Topics Concern  ? Not on file  ?Social History Narrative  ? Speaks English and Arabic. Patient lives at home with her daughter Anne Roach). Patient is widowed.  ? Patient is retired.  ? Education high school.  ? Right handed.  ? Two children.  ? Caffeine None  ? ?Social Determinants of Health  ? ?Financial Resource Strain: Low Risk   ? Difficulty of Paying Living  Expenses: Not hard at all  ?Food Insecurity: No Food Insecurity  ? Worried About Charity fundraiser in the Last Year: Never true  ? Ran Out of Food in the Last Year: Never true  ?Transportation Needs: No Transportation Needs  ? Lack of Transportation (Medical): No  ? Lack of Transportation (Non-Medical): No  ?Physical Activity: Not on file  ?Stress: Not on file  ?Social Connections: Not on file  ? ?Education Assessment and Provision: ? ?Detailed education and instructions provided on heart failure disease management including the following: ? ?Signs and symptoms of Heart Failure ?When to call the physician ?Importance of daily weights ?Low sodium diet ?Fluid restriction ?Medication management ?Anticipated future follow-up appointments ? ?Patient education given on each of the above topics.  Patient acknowledges understanding via teach back method and acceptance of all instructions. ? ?Education Materials:  "Living Better With Heart Failure" Booklet, HF zone tool, & Daily Weight Tracker Tool. ? ?Patient has scale at home: yes ?Patient has pill box at home: NA   ? ?High Risk Criteria for Readmission and/or Poor Patient Outcomes: ?Heart failure hospital admissions (last 6 months): 1  ?No Show rate: 4 ?Difficult social situation: No ?Demonstrates medication adherence: Yes ?Primary Language: Arabic ( speaks good english) ?Literacy level: Reading, writing, comprehension.  ? ?Barriers of Care:   ?Daily weights ?Diet/ fluids restrictions ? ?Considerations/Referrals:  ? ?Referral made to Heart  Failure Pharmacist Stewardship: No ?Referral made to Heart Failure CSW/NCM TOC: No needs ?Referral made to Heart & Vascular TOC clinic: yes, 04/02/2022 @ 12 noon ? ?Items for Follow-up on DC/TOC: ?Diet/ fluid restrictions ?Daily weights ? ? ?Earnestine Leys, BSN, RN ?Heart Failure Nurse Navigator ?Secure Chat Only   ?

## 2022-03-28 NOTE — Telephone Encounter (Signed)
Patient daughter cancelled appointment due to she is in the hospital and already had there. Order will be removed from the echo WQ. Thank you ?

## 2022-03-28 NOTE — Progress Notes (Signed)
SATURATION QUALIFICATIONS: (This note is used to comply with regulatory documentation for home oxygen) ? ?Patient Saturations on Room Air at Rest = 93% ? ?Patient Saturations on Room Air while Ambulating = 85% ? ?Patient Saturations on 2 Liters of oxygen while Ambulating = 96% ? ?Please briefly explain why patient needs home oxygen: Pt unable to maintain SpO2 > 90% on RA with gait.  ? ?West Carbo, PT, DPT  ? ?Acute Rehabilitation Department ?Pager #: [phone] - [phone] ?

## 2022-03-28 NOTE — Progress Notes (Signed)
?PROGRESS NOTE ? ? ? ?Anne ISOLA  Roach DOB: [birth year] DOA: 03/27/2022 ?PCP: Anne Landsman, MD  ? ? Anne Roach is a 88 y.o. female with medical history significant of PAF on Eliquis; chronic diastolic CHF; DM; and HTN presenting with SOB. Symptoms started on 5/1.  Had multiple ED trips in the last week for dyspnea, often self-limiting. ?-History of dyspnea on exertion, chronic lower extremity edema, in the ED BNP was 449, troponin was negative, chest x-ray noted bilateral pleural effusions, noted to be in A-fib with heart rate in the 120, 130 range ? ?Subjective: ?-Feels a little better today overall, breathing starting to improve, has some burning pain under her left breast ? ?Assessment and Plan: ? ?Acute on chronic diastolic CHF ?-Third ED visit since 5/11 for dyspnea  ?-Chest x-ray with bilateral pleural effusion and pulmonary vascular congestion  ?-Echo with EF of 50%, mild LVH, indeterminate diastolic parameters, mild PAH ?-Continue IV Lasix today, she is -1.1 L ?-Monitor daily weights ?-Add Aldactone, BMP in a.m. ?-Ambulate, PT eval ?  ?Afib ?-Rate relatively controlled, cards following switch to diltiazem to metoprolol, EF felt to be slightly lower ?-Continue Eliquis ?  ?DM ?-CBGs are stable, Glucophage on hold ?-Continue moderate-scale SSI  ? ?Acute on chronic anemia ?-No overt bleeding, check anemia panel ?  ?Chronic pain ?-Continue Ultram, Neurontin ?  ?Stage 3a CKD ?-Appears to be stable ? ?DVT prophylaxis: Eliquis ?CODE STATUS: Full code ?Family communication: Discussed with patient detail, no family at bedside ?Disposition, home likely 48 hours ?  ? ?Procedures:  ? ?Antimicrobials:  ? ? ?Objective: ?Vitals:  ? 03/27/22 2140 03/27/22 2358 03/28/22 0200 03/28/22 0839  ?BP: (!) 132/93 128/88  122/74  ?Pulse: 85 96  62  ?Resp: '16 20  18  '$ ?Temp: 97.9 ?F (36.6 ?C) 97.7 ?F (36.5 ?C)  98.7 ?F (37.1 ?C)  ?TempSrc: Oral Oral  Oral  ?SpO2: 100% 99%  94%  ?Weight: 81.9 kg  80.9 kg   ?Height: 5'  6" (1.676 m)     ? ? ?Intake/Output Summary (Last 24 hours) at 03/28/2022 1014 ?Last data filed at 03/28/2022 6720 ?Gross per 24 hour  ?Intake 436 ml  ?Output 1600 ml  ?Net -1164 ml  ? ?Filed Weights  ? 03/27/22 0817 03/27/22 2140 03/28/22 0200  ?Weight: 75 kg 81.9 kg 80.9 kg  ? ? ?Examination: ? ?General exam: Obese elderly female sitting up in bed, AAOx3, no distress ?HEENT: Positive JVD ?CVS: S1-S2, irregularly irregular rhythm ?Lungs: Basilar Rales noted ?Abdomen: Soft, obese, nontender, bowel sounds present ?Extremities: 1+ edema ?Skin: No rashes ?Psychiatry:  Mood & affect appropriate.  ? ? ? ?Data Reviewed:  ? ?CBC: ?Recent Labs  ?Lab 03/27/22 ?9470 03/28/22 ?0401  ?WBC 7.9 6.9  ?NEUTROABS 5.3  --   ?HGB 9.3* 8.8*  ?HCT 31.6* 29.5*  ?MCV 91.1 90.8  ?PLT 267 233  ? ?Basic Metabolic Panel: ?Recent Labs  ?Lab 03/27/22 ?9628 03/27/22 ?1400 03/28/22 ?0401  ?NA 137  --  137  ?K 4.7  --  3.7  ?CL 103  --  102  ?CO2 25  --  24  ?GLUCOSE 170*  --  100*  ?BUN 12  --  13  ?CREATININE 0.98  --  1.11*  ?CALCIUM 8.6*  --  8.3*  ?MG  --  2.4  --   ? ?GFR: ?Estimated Creatinine Clearance: 38.3 mL/min (A) (by C-G formula based on SCr of 1.11 mg/dL (H)). ?Liver Function Tests: ?No results for input(s):  AST, ALT, ALKPHOS, BILITOT, PROT, ALBUMIN in the last 168 hours. ?No results for input(s): LIPASE, AMYLASE in the last 168 hours. ?No results for input(s): AMMONIA in the last 168 hours. ?Coagulation Profile: ?No results for input(s): INR, PROTIME in the last 168 hours. ?Cardiac Enzymes: ?No results for input(s): CKTOTAL, CKMB, CKMBINDEX, TROPONINI in the last 168 hours. ?BNP (last 3 results) ?No results for input(s): PROBNP in the last 8760 hours. ?HbA1C: ?Recent Labs  ?  03/27/22 ?1244  ?HGBA1C 6.1*  ? ?CBG: ?Recent Labs  ?Lab 03/27/22 ?1626 03/27/22 ?2151 03/28/22 ?5956  ?GLUCAP 108* 106* 118*  ? ?Lipid Profile: ?No results for input(s): CHOL, HDL, LDLCALC, TRIG, CHOLHDL, LDLDIRECT in the last 72 hours. ?Thyroid Function  Tests: ?No results for input(s): TSH, T4TOTAL, FREET4, T3FREE, THYROIDAB in the last 72 hours. ?Anemia Panel: ?No results for input(s): VITAMINB12, FOLATE, FERRITIN, TIBC, IRON, RETICCTPCT in the last 72 hours. ?Urine analysis: ?   ?Component Value Date/Time  ? COLORURINE YELLOW 01/18/2021 1059  ? APPEARANCEUR CLEAR 01/18/2021 1059  ? LABSPEC 1.013 01/18/2021 1059  ? PHURINE 6.0 01/18/2021 1059  ? GLUCOSEU NEGATIVE 01/18/2021 1059  ? HGBUR NEGATIVE 01/18/2021 1059  ? [place] NEGATIVE 01/18/2021 1059  ? [place] NEGATIVE 01/18/2021 1059  ? PROTEINUR NEGATIVE 01/18/2021 1059  ? UROBILINOGEN 0.2 06/12/2011 1201  ? NITRITE NEGATIVE 01/18/2021 1059  ? LEUKOCYTESUR NEGATIVE 01/18/2021 1059  ? ?Sepsis Labs: ?'@LABRCNTIP'$ (procalcitonin:4,lacticidven:4) ? ?)No results found for this or any previous visit (from the past 240 hour(s)).  ? ?Radiology Studies: ?DG Chest 2 View ? ?Result Date: 03/27/2022 ?CLINICAL DATA:  Shortness of breath for 2 weeks EXAM: CHEST - 2 VIEW COMPARISON:  Chest radiograph and chest CT 03/19/2022 FINDINGS: Heart is enlarged, unchanged. The upper mediastinal contours are stable. There are bilateral pleural effusions with adjacent bibasilar airspace opacities, overall slightly worsened since 03/19/2022. There is vascular congestion without definite overt pulmonary edema. There is no pneumothorax There is no acute osseous abnormality. IMPRESSION: Bilateral pleural effusions with adjacent bibasilar airspace opacities, worsened since 03/19/2022. Electronically Signed   By: Valetta Mole M.D.   On: 03/27/2022 09:03  ? ?ECHOCARDIOGRAM COMPLETE ? ?Result Date: 03/27/2022 ?   ECHOCARDIOGRAM REPORT   [name] Date of Exam: 03/27/2022 Medical Rec #:  [id number]        Height:       65.0 in Accession #:    [id number]       Weight:       165.3 lb Date of Birth:  Sep 10, 1933        BSA:          1.824 m? Patient Age:    15 years         BP:           117/73 mmHg Patient Gender: F                 HR:           88 bpm. Exam Location:  Inpatient Procedure: 2D Echo Indications:    acute systolic chf  History:        Patient has prior history of Echocardiogram examinations, most                 recent 11/01/2020. CHF, chronic kidney disease,                 Arrythmias:Atrial Fibrillation; Risk Factors:Diabetes,  Hypertension and Sleep Apnea.  Sonographer:    Johny Chess RDCS Referring Phys: [place]  1. Left ventricular ejection fraction, by estimation, is 50 to 55%. The left ventricle has low normal function. The left ventricle demonstrates global hypokinesis. There is mild left ventricular hypertrophy. Left ventricular diastolic function could not  be evaluated. There is incoordinate septal motion.  2. Right ventricular systolic function is normal. The right ventricular size is normal. There is mildly elevated pulmonary artery systolic pressure. The estimated right ventricular systolic pressure is 27.6 mmHg.  3. Left atrial size was severely dilated.  4. Right atrial size was moderately dilated.  5. A small pericardial effusion is present. The pericardial effusion is posterior to the left ventricle.  6. The mitral valve is abnormal. Mild mitral valve regurgitation.  7. The aortic valve is tricuspid. Aortic valve regurgitation is not visualized. Aortic valve sclerosis is present, with no evidence of aortic valve stenosis.  8. The inferior vena cava is normal in size with greater than 50% respiratory variability, suggesting right atrial pressure of 3 mmHg. Comparison(s): No significant change from prior study. 11/01/2020: LVEF 50-55%. FINDINGS  Left Ventricle: Left ventricular ejection fraction, by estimation, is 50 to 55%. The left ventricle has low normal function. The left ventricle demonstrates global hypokinesis. The left ventricular internal cavity size was normal in size. There is mild left ventricular hypertrophy. Incoordinate septal motion. Left ventricular  diastolic function could not be evaluated due to atrial fibrillation. Left ventricular diastolic function could not be evaluated. Right Ventricle: The right ventricular size is normal. No increase in right ve

## 2022-03-28 NOTE — Progress Notes (Addendum)
Heart Failure Stewardship Pharmacist Progress Note ? ? ?PCP: Lin Landsman, MD ?PCP-Cardiologist: Jenkins Rouge, MD  ? ? ?HPI:  ?88 yo female with PMH T2DM, HTN, OSA, permanent Afib (PTA Eliquis), chronic diastolic CHF, and gout. Presented to ED with worsening dyspnea arrived on 2L [place] with O2 sat 89% and in Afib. Patient endorsed orthopnea and denied weight gain or LEE outside of her baseline swelling. Echo 05/12 with LVEF 50-55%, mild LVH, normal RV, mildly elevated pulmonary pressures, biatrial dilation, and mild MVR. Compliant to PTA diuretic regimen. Notable PTA medications include diltiazem 240 mg daily + PRN (required dose this past week). ? ?Current HF Medications: ?Diuretic: furosemide 40 mg IV BID ?Beta Blocker: metoprolol succinate 50 mg daily ? ?Prior to admission HF Medications: ?Diuretic: furosemide 40 mg daily ?Other: potassium 20 mEq daily ? ?Pertinent Lab Values: ?Serum creatinine 1.11, BUN 13, Potassium 3.7, Sodium 137, BNP 449.7, Magnesium 2.4, A1c 6.1%  ? ?Vital Signs: ?Weight: 178.4 lbs (admission weight: 180.5 lbs) ?Blood pressure: 120s/80s  ?Heart rate: 70-80s  ?I/O: -1.1L so far today; net -664 cc ? ?Medication Assistance / Insurance Benefits Check: ?Does the patient have prescription insurance?  Yes ?Type of insurance plan: Medicare/Medicaid ? ?Outpatient Pharmacy:  ?Prior to admission outpatient pharmacy: [facility] ?Is the patient willing to use [facility] pharmacy at discharge? Pending ?Is the patient willing to transition their outpatient pharmacy to utilize a [facility] outpatient pharmacy?   Pending ?  ? ?Assessment: ?1. Acute on chronic diastolic CHF (LVEF 73-22%). NYHA class III symptoms. ?Patient improving with diuresis, now on RA and endorses improved breathing. ?- Continue furosemide IV 40 mg BID ?- Continue metoprolol succinate 50 mg daily ?  ?Plan: ?1) Medication changes recommended at this time: ?- Start Entresto 24/26 mg twice daily  ?- Consider adding SGLT2i tomorrow pending renal  function.  ?- Consider adding spironolactone 12.5 mg daily over the weekend   ?- Per discussion with Dr. Harrell Gave VT on EKG likely an artifact. Will continue to monitor for NSVT with option to metoprolol tartrate 25 mg BID if any additional episodes ? ?2) Patient assistance: ?- pending patient discussion ? ?3)  Education  ?- To be completed prior to discharge ?- Ensure PTA diltiazem discontinued at discharge ? ?Laurey Arrow, PharmD ?PGY1 Pharmacy Resident ?03/28/2022  8:52 AM ? ?

## 2022-03-28 NOTE — Progress Notes (Addendum)
Initial Nutrition Assessment ? ?DOCUMENTATION CODES:  ? ?Not applicable ? ?INTERVENTION:  ?Provide Ensure Enlive po once daily, each supplement provides 350 kcal and 20 grams of protein. ? ?Encourage adequate PO intake.  ? ?NUTRITION DIAGNOSIS:  ? ?Increased nutrient needs related to chronic illness (CHF) as evidenced by estimated needs. ? ?GOAL:  ? ?Patient will meet greater than or equal to 90% of their needs ? ?MONITOR:  ? ?PO intake, Supplement acceptance, Labs, Weight trends, I & O's, Skin ? ?REASON FOR ASSESSMENT:  ? ?Consult ?Assessment of nutrition requirement/status ? ?ASSESSMENT:  ? ?88 y.o. female with medical history significant of PAF on Eliquis; chronic diastolic CHF; DM; and HTN presenting with SOB. Chest x-ray with bilateral pleural effusion and pulmonary vascular congestion. Pt with acute on chronic diastolic CHF. ? ?Meal completion has been 100%. Pt reports having a good appetite currently and PTA with no difficulties. Pt with no weight loss. RD to order Ensure to aid in caloric and protein needs. Pt encouraged to eat her food at meals and to drink her supplement.  ? ?NUTRITION - FOCUSED PHYSICAL EXAM: ? ?Flowsheet Row Most Recent Value  ?Orbital Region No depletion  ?Upper Arm Region No depletion  ?Thoracic and Lumbar Region No depletion  ?Buccal Region No depletion  ?Temple Region No depletion  ?Clavicle Bone Region No depletion  ?Clavicle and Acromion Bone Region No depletion  ?Scapular Bone Region Unable to assess  ?Dorsal Hand No depletion  ?Patellar Region No depletion  [edema]  ?Anterior Thigh Region No depletion  [edema]  ?Posterior Calf Region No depletion  [edema]  ?Edema (RD Assessment) Mild  ?Hair Reviewed  ?Eyes Reviewed  ?Mouth Reviewed  ?Skin Reviewed  ?Nails Reviewed  ? ?  ? ?Labs and medications reviewed.  ? ?Diet Order:   ?Diet Order   ? ?       ?  Diet heart healthy/carb modified Room service appropriate? Yes; Fluid consistency: Thin; Fluid restriction: 1500 mL Fluid  Diet  effective now       ?  ? ?  ?  ? ?  ? ? ?EDUCATION NEEDS:  ? ?Not appropriate for education at this time ? ?Skin:  Skin Assessment: Reviewed RN Assessment ? ?Last BM:  Unknown ? ?Height:  ? ?Ht Readings from Last 1 Encounters:  ?03/27/22 '5\' 6"'$  (1.676 m)  ? ? ?Weight:  ? ?Wt Readings from Last 1 Encounters:  ?03/28/22 80.9 kg  ? ?BMI:  Body mass index is 28.79 kg/m?. ? ?Estimated Nutritional Needs:  ? ?Kcal:  1800-2000 ? ?Protein:  80-90 grams ? ?Fluid:  1.5 L/day ? ?Corrin Parker, MS, RD, LDN ?RD pager number/after hours weekend pager number on Amion. ? ?

## 2022-03-28 NOTE — Progress Notes (Addendum)
? ?Progress Note ? ?Patient Name: Anne Roach ?Date of Encounter: 03/28/2022 ? ?[facility] Cardiologist: Jenkins Rouge, MD  ? ?Subjective  ? ?She did not sleep well last night. She had palpitations at 4AM. ? ?Inpatient Medications  ?  ?Scheduled Meds: ? allopurinol  100 mg Oral Daily  ? apixaban  5 mg Oral BID  ? docusate sodium  100 mg Oral BID  ? folic acid  1,740 mcg Oral Daily  ? furosemide  40 mg Intravenous BID  ? gabapentin  400 mg Oral TID  ? insulin aspart  0-15 Units Subcutaneous TID WC  ? insulin aspart  0-5 Units Subcutaneous QHS  ? metoprolol succinate  50 mg Oral Daily  ? nystatin   Topical BID  ? sodium chloride flush  3 mL Intravenous Q12H  ? ?Continuous Infusions: ? ?PRN Meds: ?acetaminophen **OR** acetaminophen, bisacodyl, hydrALAZINE, ondansetron **OR** ondansetron (ZOFRAN) IV, oxyCODONE, polyethylene glycol, traMADol, traZODone  ? ?Vital Signs  ?  ?Vitals:  ? 03/27/22 2041 03/27/22 2140 03/27/22 2358 03/28/22 0200  ?BP: (!) 106/58 (!) 132/93 128/88   ?Pulse: 83 85 96   ?Resp: '19 16 20   '$ ?Temp: 97.8 ?F (36.6 ?C) 97.9 ?F (36.6 ?C) 97.7 ?F (36.5 ?C)   ?TempSrc: Oral Oral Oral   ?SpO2: 99% 100% 99%   ?Weight:  81.9 kg  80.9 kg  ?Height:  '5\' 6"'$  (1.676 m)    ? ? ?Intake/Output Summary (Last 24 hours) at 03/28/2022 0749 ?Last data filed at 03/28/2022 0400 ?Gross per 24 hour  ?Intake 200 ml  ?Output 1100 ml  ?Net -900 ml  ? ? ?  03/28/2022  ?  2:00 AM 03/27/2022  ?  9:40 PM 03/27/2022  ?  8:17 AM  ?Last 3 Weights  ?Weight (lbs) 178 lb 6.4 oz 180 lb 8 oz 165 lb 5.5 oz  ?Weight (kg) 80.922 kg 81.874 kg 75 kg  ?   ? ?Telemetry  ?  ?Atrial fibrillation with HR 80-90s, possible NSVT at 0728 - Personally Reviewed ? ?ECG  ?  ?No new tracings - Personally Reviewed ? ?Physical Exam  ? ?GEN: No acute distress.   ?Neck: No JVD - exam difficult ?Cardiac: irregular rhythm, regular rate, soft murmur ?Respiratory: crackles in bases bilaterally ?GI: Soft, nontender, non-distended  ?MS: minimal edema; No  deformity. ?Neuro:  Nonfocal  ?Psych: Normal affect  ? ?Labs  ?  ?High Sensitivity Troponin:   ?Recent Labs  ?Lab 03/17/22 ?2220 03/19/22 ?2107 03/19/22 ?2317 03/27/22 ?8144 03/27/22 ?1404  ?TROPONINIHS '7 8 7 7 7  '$ ?   ?Chemistry ?Recent Labs  ?Lab 03/27/22 ?8185 03/27/22 ?1400 03/28/22 ?0401  ?NA 137  --  137  ?K 4.7  --  3.7  ?CL 103  --  102  ?CO2 25  --  24  ?GLUCOSE 170*  --  100*  ?BUN 12  --  13  ?CREATININE 0.98  --  1.11*  ?CALCIUM 8.6*  --  8.3*  ?MG  --  2.4  --   ?GFRNONAA 56*  --  48*  ?ANIONGAP 9  --  11  ?  ?Lipids No results for input(s): CHOL, TRIG, HDL, LABVLDL, LDLCALC, CHOLHDL in the last 168 hours.  ?Hematology ?Recent Labs  ?Lab 03/27/22 ?6314 03/28/22 ?0401  ?WBC 7.9 6.9  ?RBC 3.47* 3.25*  ?HGB 9.3* 8.8*  ?HCT 31.6* 29.5*  ?MCV 91.1 90.8  ?MCH 26.8 27.1  ?MCHC 29.4* 29.8*  ?RDW 19.0* 18.8*  ?PLT 267 233  ? ?Thyroid  No results for input(s): TSH, FREET4 in the last 168 hours.  ?BNP ?Recent Labs  ?Lab 03/27/22 ?0837  ?BNP 449.7*  ?  ?DDimer No results for input(s): DDIMER in the last 168 hours.  ? ?Radiology  ?  ?DG Chest 2 View ? ?Result Date: 03/27/2022 ?CLINICAL DATA:  Shortness of breath for 2 weeks EXAM: CHEST - 2 VIEW COMPARISON:  Chest radiograph and chest CT 03/19/2022 FINDINGS: Heart is enlarged, unchanged. The upper mediastinal contours are stable. There are bilateral pleural effusions with adjacent bibasilar airspace opacities, overall slightly worsened since 03/19/2022. There is vascular congestion without definite overt pulmonary edema. There is no pneumothorax There is no acute osseous abnormality. IMPRESSION: Bilateral pleural effusions with adjacent bibasilar airspace opacities, worsened since 03/19/2022. Electronically Signed   By: Valetta Mole M.D.   On: 03/27/2022 09:03  ? ?ECHOCARDIOGRAM COMPLETE ? ?Result Date: 03/27/2022 ?   ECHOCARDIOGRAM REPORT   [name] Date of Exam: 03/27/2022 Medical Rec #:  [id number]        Height:       65.0 in Accession #:     [id number]       Weight:       165.3 lb Date of Birth:  May 25, 1933        BSA:          1.824 m? Patient Age:    88 years         BP:           117/73 mmHg Patient Gender: F                HR:           88 bpm. Exam Location:  Inpatient Procedure: 2D Echo Indications:    acute systolic chf  History:        Patient has prior history of Echocardiogram examinations, most                 recent 11/01/2020. CHF, chronic kidney disease,                 Arrythmias:Atrial Fibrillation; Risk Factors:Diabetes,                 Hypertension and Sleep Apnea.  Sonographer:    Johny Chess RDCS Referring Phys: [place]  1. Left ventricular ejection fraction, by estimation, is 50 to 55%. The left ventricle has low normal function. The left ventricle demonstrates global hypokinesis. There is mild left ventricular hypertrophy. Left ventricular diastolic function could not  be evaluated. There is incoordinate septal motion.  2. Right ventricular systolic function is normal. The right ventricular size is normal. There is mildly elevated pulmonary artery systolic pressure. The estimated right ventricular systolic pressure is 73.4 mmHg.  3. Left atrial size was severely dilated.  4. Right atrial size was moderately dilated.  5. A small pericardial effusion is present. The pericardial effusion is posterior to the left ventricle.  6. The mitral valve is abnormal. Mild mitral valve regurgitation.  7. The aortic valve is tricuspid. Aortic valve regurgitation is not visualized. Aortic valve sclerosis is present, with no evidence of aortic valve stenosis.  8. The inferior vena cava is normal in size with greater than 50% respiratory variability, suggesting right atrial pressure of 3 mmHg. Comparison(s): No significant change from prior study. 11/01/2020: LVEF 50-55%. FINDINGS  Left Ventricle: Left ventricular ejection fraction, by estimation, is 50 to 55%. The left ventricle has low normal  function. The left  ventricle demonstrates global hypokinesis. The left ventricular internal cavity size was normal in size. There is mild left ventricular hypertrophy. Incoordinate septal motion. Left ventricular diastolic function could not be evaluated due to atrial fibrillation. Left ventricular diastolic function could not be evaluated. Right Ventricle: The right ventricular size is normal. No increase in right ventricular wall thickness. Right ventricular systolic function is normal. There is mildly elevated pulmonary artery systolic pressure. The tricuspid regurgitant velocity is 3.08  m/s, and with an assumed right atrial pressure of 3 mmHg, the estimated right ventricular systolic pressure is 42.3 mmHg. Left Atrium: Left atrial size was severely dilated. Right Atrium: Right atrial size was moderately dilated. Pericardium: A small pericardial effusion is present. The pericardial effusion is posterior to the left ventricle. Mitral Valve: The mitral valve is abnormal. There is mild thickening of the anterior and posterior mitral valve leaflet(s). Mild to moderate mitral annular calcification. Mild mitral valve regurgitation. Tricuspid Valve: The tricuspid valve is grossly normal. Tricuspid valve regurgitation is mild. Aortic Valve: The aortic valve is tricuspid. Aortic valve regurgitation is not visualized. Aortic valve sclerosis is present, with no evidence of aortic valve stenosis. Pulmonic Valve: The pulmonic valve was normal in structure. Pulmonic valve regurgitation is not visualized. Aorta: The aortic root and ascending aorta are structurally normal, with no evidence of dilitation. Venous: The inferior vena cava is normal in size with greater than 50% respiratory variability, suggesting right atrial pressure of 3 mmHg. IAS/Shunts: No atrial level shunt detected by color flow Doppler.  LEFT VENTRICLE PLAX 2D LVIDd:         4.70 cm LVIDs:         3.70 cm LV PW:         1.40 cm LV IVS:        1.20 cm LVOT diam:     2.20 cm  LVOT Area:     3.80 cm?  IVC IVC diam: 2.10 cm LEFT ATRIUM             Index        RIGHT ATRIUM           Index LA diam:        4.90 cm 2.69 cm/m?   RA Area:     21.30 cm? LA Vol (A2C):   91.1 ml 49.93 ml/m?  RA Volume:   61.3

## 2022-03-28 NOTE — Evaluation (Signed)
Physical Therapy Evaluation ?Patient Details ?Name: Anne Roach ?MRN: [id number] ?DOB: 09/12/1933 ?Today's Date: 03/28/2022 ? ?History of Present Illness ? The pt is an 88 yo female presenting 5/11 with increasing SOB. Pt with x3 admissions for same complaint since 5/1. Chest X-ray shows bilateral pleural effusions, pt with acute on chronic CHF exacerbation. PMH includes: afib, CHF, HTN, and DM II. ?  ?Clinical Impression ? Pt in bed upon arrival of PT, agreeable to evaluation at this time. Prior to admission the pt was mobilizing in the home without use of DME, but reports limited endurance and mobility due to SOB. The pt was able to complete multiple short bouts of mobility with no DME but intermittent minA due to x3 minor LOB. The pt did have drop in SpO2 to 85% on RA with ambulation, and required seated rest and 2L O2 to return to 90%. The pt was then able to continue mobility (25 ft + 19f) with 2L O2 and SpO2 low of 96%. The pt reports she is home alone from 3am - 6pm while her daughter is at work 6/7 days  a week (daughter has 2 jobs), and [facility]. I discussed additional options such as PACE program in the long run that would provide greater supervision, activities, and therapy to maintain independence and spend less time alone at home. Will continue to benefit from skilled PT acutely to progress endurance and further determine O2 needs.    ?   ? ?Recommendations for follow up therapy are one component of a multi-disciplinary discharge planning process, led by the attending physician.  Recommendations may be updated based on patient status, additional functional criteria and insurance authorization. ? ?Follow Up Recommendations Home health PT (would benefit from increased aide hours in short term or information on PACE program) ? ?  ?Assistance Recommended at Discharge Frequent or constant Supervision/Assistance  ?Patient can return home with the following ? A little  help with walking and/or transfers;A little help with bathing/dressing/bathroom;Assistance with cooking/housework;Direct supervision/assist for medications management;Direct supervision/assist for financial management;Assist for transportation;Help with stairs or ramp for entrance ? ?  ?Equipment Recommendations None recommended by PT  ?Recommendations for Other Services ?    ?  ?Functional Status Assessment Patient has had a recent decline in their functional status and demonstrates the ability to make significant improvements in function in a reasonable and predictable amount of time.  ? ?  ?Precautions / Restrictions Precautions ?Precautions: Fall ?Precaution Comments: Watch O2 sats and HR ?Restrictions ?Weight Bearing Restrictions: No  ? ?  ? ?Mobility ? Bed Mobility ?Overal bed mobility: Needs Assistance ?Bed Mobility: Sit to Supine ?  ?  ?  ?Sit to supine: Supervision ?  ?General bed mobility comments: supervision to return to supine and resposition in bed ?  ? ?Transfers ?Overall transfer level: Needs assistance ?Equipment used: None ?Transfers: Sit to/from Stand ?Sit to Stand: Min guard ?  ?  ?  ?  ?  ?General transfer comment: minG for safety, minA to steady on single occasion ?  ? ?Ambulation/Gait ?Ambulation/Gait assistance: Min guard, Min assist ?Gait Distance (Feet): 45 Feet (+ 25 ft + 75 ft) ?Assistive device: None ?Gait Pattern/deviations: Step-through pattern, Decreased stride length, Staggering right ?Gait velocity: decreased ?Gait velocity interpretation: <1.31 ft/sec, indicative of household ambulator ?  ?General Gait Details: pt initially walking on RA, needing seated rest after ~45 ft due to SOB and SpO2 to 85%. Recovered on 2L O2 and was able  to return to room, then resume walking after additional seated rest. x3 instances of instability needing minA to steady ? ?  ? ?Balance Overall balance assessment: Needs assistance ?Sitting-balance support: Feet supported ?Sitting balance-Leahy Scale:  Good ?  ?  ?Standing balance support: Single extremity supported ?Standing balance-Leahy Scale: Fair ?Standing balance comment: x3 instances of needing minA to steady, no overt LOB. no UE support ?  ?  ?  ?  ?  ?  ?  ?  ?  ?  ?  ?   ? ? ? ?Pertinent Vitals/Pain Pain Assessment ?Pain Assessment: Faces ?Faces Pain Scale: Hurts little more ?Pain Location: burning in skin fold under her breast ?Pain Descriptors / Indicators: Burning, Discomfort ?Pain Intervention(s): Limited activity within patient's tolerance, Monitored during session, Repositioned  ? ? ?Home Living Family/patient expects to be discharged to:: Private residence ?Living Arrangements: Children ?Available Help at Discharge: Family;Available PRN/intermittently (pt's daughter out of the home 3am-6pm each day other than wednesday. Working in [place] and not close to pt if she were to need something) ?Type of Home: House ?Home Access: Level entry ?  ?  ?  ?Home Layout: Multi-level;Able to live on main level with bedroom/bathroom ?Home Equipment: Rolling Walker (2 wheels);Grab bars - tub/shower;Shower seat - built in;BSC/3in1 ?Additional Comments: does not use RW  ?  ?Prior Function Prior Level of Function : Needs assist ?  ?  ?  ?  ?  ?  ?Mobility Comments: Walks household distances without AD ?ADLs Comments: Completes her own ADL, and participates with light IADL at times.  Daughter assists with med and community mobility. ?  ? ? ?Hand Dominance  ? Dominant Hand: Right ? ?  ?Extremity/Trunk Assessment  ? Upper Extremity Assessment ?Upper Extremity Assessment: Defer to OT evaluation ?  ? ?Lower Extremity Assessment ?Lower Extremity Assessment: Generalized weakness ?  ? ?Cervical / Trunk Assessment ?Cervical / Trunk Assessment: Kyphotic  ?Communication  ? Communication: No difficulties  ?Cognition Arousal/Alertness: Awake/alert ?Behavior During Therapy: [facility] for tasks assessed/performed ?Overall Cognitive Status: Within Functional Limits for tasks assessed ?  ?   ?  ?  ?  ?  ?  ?  ?  ?  ?  ?  ?  ?  ?  ?  ?General Comments: pt following all cues and instructions ?  ?  ? ?  ?General Comments General comments (skin integrity, edema, etc.): SpO2 93% on RA, low of 85% on RA with gait. improved to low of 96% with 2L and gait ? ?  ?   ? ?Assessment/Plan  ?  ?PT Assessment Patient needs continued PT services  ?PT Problem List Decreased activity tolerance;Decreased balance;Decreased safety awareness;Cardiopulmonary status limiting activity ? ?   ?  ?PT Treatment Interventions DME instruction;Gait training;Stair training;Functional mobility training;Therapeutic activities;Therapeutic exercise;Balance training;Patient/family education   ? ?PT Goals (Current goals can be found in the Care Plan section)  ?Acute Rehab PT Goals ?Patient Stated Goal: maintain independence ?PT Goal Formulation: With patient ?Time For Goal Achievement: 04/11/22 ?Potential to Achieve Goals: Good ? ?  ?Frequency Min 3X/week ?  ? ? ?   ?AM-PAC PT "6 Clicks" Mobility  ?Outcome Measure Help needed turning from your back to your side while in a flat bed without using bedrails?: None ?Help needed moving from lying on your back to sitting on the side of a flat bed without using bedrails?: None ?Help needed moving to and from a bed to a chair (including a wheelchair)?: A Little ?Help needed  standing up from a chair using your arms (e.g., wheelchair or bedside chair)?: A Little ?Help needed to walk in hospital room?: A Little ?Help needed climbing 3-5 steps with a railing? : A Little ?6 Click Score: 20 ? ?  ?End of Session Equipment Utilized During Treatment: Gait belt;Oxygen ?Activity Tolerance: Patient tolerated treatment well ?Patient left: with call bell/phone within reach;in bed ?Nurse Communication: Mobility status ?PT Visit Diagnosis: Other abnormalities of gait and mobility (R26.89);Muscle weakness (generalized) (M62.81) ?  ? ?Time: 5747-3403 ?PT Time Calculation (min) (ACUTE ONLY): 33 min ? ? ?Charges:   PT  Evaluation ?$PT Eval Moderate Complexity: 1 Mod ?PT Treatments ?$Therapeutic Exercise: 8-22 mins ?  ?   ? ? ?[place], PT, DPT  ? ?Acute Rehabilitation Department ?Pager #: [phone] - [phone] ? ?Karma Ganja Zho

## 2022-03-28 NOTE — TOC Progression Note (Signed)
Transition of Care (TOC) - Progression Note  ? ? ?Patient Details  ?Name: Anne Roach ?MRN: [id number] ?Date of Birth: 09-26-1933 ? ?Transition of Care (TOC) CM/SW Contact  ?Zenon Mayo, RN ?Phone Number: ?03/28/2022, 1:24 PM ? ?Clinical Narrative:    ? ?From home, CHF ex, conts on IV lasix, has chronic afib, on eliquis pta.  TOC will continue to follow for dc needs. ? ?  ?  ? ?Expected Discharge Plan and Services ?  ?  ?  ?  ?  ?                ?  ?  ?  ?  ?  ?  ?  ?  ?  ?  ? ? ?Social Determinants of Health (SDOH) Interventions ?  ? ?Readmission Risk Interventions ?   ? View : No data to display.  ?  ?  ?  ? ? ?

## 2022-03-29 ENCOUNTER — Inpatient Hospital Stay (HOSPITAL_COMMUNITY): Payer: Medicare HMO

## 2022-03-29 DIAGNOSIS — I5033 Acute on chronic diastolic (congestive) heart failure: Secondary | ICD-10-CM | POA: Diagnosis not present

## 2022-03-29 LAB — BASIC METABOLIC PANEL
Anion gap: 8 (ref 5–15)
BUN: 15 mg/dL (ref 8–23)
CO2: 29 mmol/L (ref 22–32)
Calcium: 8.8 mg/dL — ABNORMAL LOW (ref 8.9–10.3)
Chloride: 101 mmol/L (ref 98–111)
Creatinine, Ser: 1.22 mg/dL — ABNORMAL HIGH (ref 0.44–1.00)
GFR, Estimated: 43 mL/min — ABNORMAL LOW (ref >60)
Glucose, Bld: 105 mg/dL — ABNORMAL HIGH (ref 70–99)
Potassium: 4.2 mmol/L (ref 3.5–5.1)
Sodium: 138 mmol/L (ref 135–145)

## 2022-03-29 LAB — GLUCOSE, CAPILLARY
Glucose-Capillary: 120 mg/dL — ABNORMAL HIGH (ref 70–99)
Glucose-Capillary: 180 mg/dL — ABNORMAL HIGH (ref 70–99)
Glucose-Capillary: 103 mg/dL — ABNORMAL HIGH (ref 70–99)
Glucose-Capillary: 127 mg/dL — ABNORMAL HIGH (ref 70–99)

## 2022-03-29 LAB — CBC
HCT: 32.5 % — ABNORMAL LOW (ref 36.0–46.0)
Hemoglobin: 10 g/dL — ABNORMAL LOW (ref 12.0–15.0)
MCH: 27.4 pg (ref 26.0–34.0)
MCHC: 30.8 g/dL (ref 30.0–36.0)
MCV: 89 fL (ref 80.0–100.0)
Platelets: 251 10*3/uL (ref 150–400)
RBC: 3.65 MIL/uL — ABNORMAL LOW (ref 3.87–5.11)
RDW: 18.5 % — ABNORMAL HIGH (ref 11.5–15.5)
WBC: 8.3 10*3/uL (ref 4.0–10.5)
nRBC: 0 % (ref 0.0–0.2)

## 2022-03-29 MED ORDER — GABAPENTIN 300 MG PO CAPS
300.0000 mg | ORAL_CAPSULE | Freq: Three times a day (TID) | ORAL | Status: DC
  Administered 2022-03-29 – 2022-04-02 (×13): 300 mg via ORAL
  Filled 2022-03-29 (×14): qty 1

## 2022-03-29 MED ORDER — EMPAGLIFLOZIN 10 MG PO TABS
10.0000 mg | ORAL_TABLET | Freq: Every day | ORAL | Status: DC
  Administered 2022-03-29 – 2022-04-02 (×5): 10 mg via ORAL
  Filled 2022-03-29 (×5): qty 1

## 2022-03-29 MED ORDER — FUROSEMIDE 40 MG PO TABS
40.0000 mg | ORAL_TABLET | Freq: Every day | ORAL | Status: DC
  Administered 2022-03-29: 40 mg via ORAL
  Filled 2022-03-29: qty 1

## 2022-03-29 MED ORDER — DAPAGLIFLOZIN PROPANEDIOL 10 MG PO TABS: 10.0000 mg | ORAL_TABLET | Freq: Every day | ORAL | Status: DC

## 2022-03-29 NOTE — TOC Initial Note (Signed)
Transition of Care (TOC) - Initial/Assessment Note  ? ? ?Patient Details  ?Name: Anne Roach ?MRN: [id number] ?Date of Birth: 09/25/1933 ? ?Transition of Care (TOC) CM/SW Contact:    ?Carles Collet, RN ?Phone Number: ?03/29/2022, 3:18 PM ? ?Clinical Narrative:            ?Spoke w patient at bedside. She states that she is active w Nurse, learning disability for home health services. Placed resumption order and notified [facility] liaison patient may DC Sunday.  ?      ? ? ?Expected Discharge Plan: [place] ?Barriers to Discharge: Continued Medical Work up ? ? ?Patient Goals and CMS Choice ?Patient states their goals for this hospitalization and ongoing recovery are:: to return home ?CMS Medicare.gov Compare Post Acute Care list provided to:: Patient ?Choice offered to / list presented to : Patient ? ?Expected Discharge Plan and Services ?Expected Discharge Plan: [place] ?  ?  ?Post Acute Care Choice: Home Health ?  ?                ?  ?  ?  ?  ?  ?  ?  ?  ?  ?  ? ?Prior Living Arrangements/Services ?  ?  ?  ?       ?  ?  ?  ?  ? ?Activities of Daily Living ?  ?  ? ?Permission Sought/Granted ?  ?  ?   ?   ?   ?   ? ?Emotional Assessment ?  ?  ?  ?  ?  ?  ? ?Admission diagnosis:  Pleural effusion [J90] ?Acute on chronic diastolic CHF (congestive heart failure) ([place]) [I50.33] ?Acute on chronic congestive heart failure, unspecified heart failure type ([place]) [I50.9] ?Patient Active Problem List  ? Diagnosis Date Noted  ? Acute on chronic diastolic CHF (congestive heart failure) ([place]) 03/27/2022  ? Chronic pain 03/27/2022  ? Permanent atrial fibrillation ([place]) 01/29/2021  ? Secondary hypercoagulable state ([place]) 01/29/2021  ? SIRS (systemic inflammatory response syndrome) ([place]) 10/31/2020  ? PNA (pneumonia) 10/31/2020  ? Abdominal pain 10/31/2020  ? GERD (gastroesophageal reflux disease) 10/31/2020  ? Chronic diastolic CHF (congestive heart failure) ([place]) 10/31/2020  ? Sepsis secondary to UTI ([place]) 07/11/2020  ?  Chronic atrial fibrillation ([place]) 07/11/2020  ? Hypervolemia   ? Campylobacter gastrointestinal tract infection 01/03/2019  ? Chronic kidney disease (CKD), stage III (moderate) 01/02/2019  ? AKI (acute kidney injury) ([place]) 01/02/2019  ? Hypovolemia with active loss of fluid 01/02/2019  ? Hypokalemia 01/02/2019  ? Hypomagnesemia 01/02/2019  ? Hypoalbuminemia 01/02/2019  ? Bandemia without diagnosis of specific infection 01/02/2019  ? Hypotension 01/02/2019  ? Fatigue   ? Sepsis ([place])   ? Bacteria in urine 01/01/2019  ? Atrial fibrillation with RVR ([place]) 03/20/2017  ? Myogenic ptosis of bilateral eyelids 06/24/2016  ? HTN (hypertension)   ? Tremor   ? Exudative age-related macular degeneration of right eye with active choroidal neovascularization ([place]) 08/29/2014  ? Pseudophakia of both eyes 08/29/2014  ? OSA (obstructive sleep apnea) 06/29/2012  ? Type 2 diabetes mellitus ([place])   ? Incidental lung nodule 03/10/2009  ? Other diseases of lung, not elsewhere classified 03/10/2009  ? ?PCP:  Lin Landsman, MD ?Pharmacy:   ?[facility] ?[place] ?[place] [address] ?Phone: [phone] Fax: [phone] ? ? ? ? ?  Social Determinants of Health (SDOH) Interventions ?Food Insecurity Interventions: Intervention Not Indicated ?Financial Strain Interventions: Other (Comment), Intervention Not Indicated ?Housing Interventions: Intervention Not Indicated ?Transportation Interventions: Intervention Not Indicated ? ?Readmission Risk Interventions ?   ? View : No data to display.  ?  ?  ?  ? ? ? ?

## 2022-03-29 NOTE — Progress Notes (Signed)
?PROGRESS NOTE ? ? ? ?Anne Roach  LZJ:[id number] DOB: 07/21/1933 DOA: 03/27/2022 ?PCP: Lin Landsman, MD  ? ? Anne Roach is a 88 y.o. female with medical history significant of PAF on Eliquis; chronic diastolic CHF; DM; and HTN presenting with SOB. Symptoms started on 5/1.  Had multiple ED trips in the last week for dyspnea, often self-limiting. ?-History of dyspnea on exertion, chronic lower extremity edema, in the ED BNP was 449, troponin was negative, chest x-ray noted bilateral pleural effusions, noted to be in A-fib with heart rate in the 120, 130 range ? ?Subjective: ?-Feels dizzy this morning breathing has improved ? ?Assessment and Plan: ? ?Acute on chronic diastolic CHF ?Bilateral pleural effusions ?-Third ED visit since 5/11 for dyspnea  ?-Chest x-ray with bilateral pleural effusion and pulmonary vascular congestion  ?-Echo with EF of 50%, mild LVH, indeterminate diastolic parameters, mild PAH ?-Diuresed with IV Lasix she is 3.4 L negative, weight down 8lbs ?-Started on Aldactone and [place] ?-Cards following, Lasix changed to p.o., repeat x-ray ?-Increase activity as tolerated ?  ?Afib ?-Rate relatively controlled, cards following switched from diltiazem to metoprolol, EF felt to be slightly lower ?-Continue Eliquis ?  ?DM ?-CBGs are stable, Glucophage on hold ?-Continue moderate-scale SSI  ? ?Acute on chronic anemia ?-No overt bleeding, stable, monitor ?  ?Chronic pain ?-Continue Ultram, Neurontin ?  ?Stage 3a CKD ?-Appears to be stable ? ?DVT prophylaxis: Eliquis ?CODE STATUS: Full code ?Family communication: Discussed with patient detail, no family at bedside ?Disposition, home likely 1 to 2 days ?  ? ?Procedures:  ? ?Antimicrobials:  ? ? ?Objective: ?Vitals:  ? 03/28/22 2017 03/29/22 0006 03/29/22 0415 03/29/22 0813  ?BP: 137/83 (!) 137/93 124/90 101/60  ?Pulse:    93  ?Resp:    16  ?Temp: 98.3 ?F (36.8 ?C)  97.7 ?F (36.5 ?C) 99 ?F (37.2 ?C)  ?TempSrc: Oral  Oral Oral  ?SpO2: 100%   91%   ?Weight:   80.3 kg   ?Height:      ? ? ?Intake/Output Summary (Last 24 hours) at 03/29/2022 1046 ?Last data filed at 03/29/2022 0900 ?Gross per 24 hour  ?Intake 836 ml  ?Output 3075 ml  ?Net -2239 ml  ? ?Filed Weights  ? 03/27/22 2140 03/28/22 0200 03/29/22 0415  ?Weight: 81.9 kg 80.9 kg 80.3 kg  ? ? ?Examination: ? ?General exam: Obese elderly female sitting up in bed, AAOx3, no distress ?HEENT: No JVD ?CVS: S1-S2, irregularly irregular rhythm ?Lungs: Decreased breath sounds bases ?Abdomen: Soft, nontender, bowel sounds present ?Extremities: Trace edema ?Skin: No rashes ?Psychiatry:  Mood & affect appropriate.  ? ? ? ?Data Reviewed:  ? ?CBC: ?Recent Labs  ?Lab 03/27/22 ?0240 03/28/22 ?0401 03/29/22 ?0427  ?WBC 7.9 6.9 8.3  ?NEUTROABS 5.3  --   --   ?HGB 9.3* 8.8* 10.0*  ?HCT 31.6* 29.5* 32.5*  ?MCV 91.1 90.8 89.0  ?PLT 267 233 251  ? ?Basic Metabolic Panel: ?Recent Labs  ?Lab 03/27/22 ?9735 03/27/22 ?1400 03/28/22 ?0401 03/29/22 ?0427  ?NA 137  --  137 138  ?K 4.7  --  3.7 4.2  ?CL 103  --  102 101  ?CO2 25  --  24 29  ?GLUCOSE 170*  --  100* 105*  ?BUN 12  --  13 15  ?CREATININE 0.98  --  1.11* 1.22*  ?CALCIUM 8.6*  --  8.3* 8.8*  ?MG  --  2.4  --   --   ? ?GFR: ?Estimated  Creatinine Clearance: 34.7 mL/min (A) (by C-G formula based on SCr of 1.22 mg/dL (H)). ?Liver Function Tests: ?No results for input(s): AST, ALT, ALKPHOS, BILITOT, PROT, ALBUMIN in the last 168 hours. ?No results for input(s): LIPASE, AMYLASE in the last 168 hours. ?No results for input(s): AMMONIA in the last 168 hours. ?Coagulation Profile: ?No results for input(s): INR, PROTIME in the last 168 hours. ?Cardiac Enzymes: ?No results for input(s): CKTOTAL, CKMB, CKMBINDEX, TROPONINI in the last 168 hours. ?BNP (last 3 results) ?No results for input(s): PROBNP in the last 8760 hours. ?HbA1C: ?Recent Labs  ?  03/27/22 ?1244  ?HGBA1C 6.1*  ? ?CBG: ?Recent Labs  ?Lab 03/28/22 ?7253 03/28/22 ?1306 03/28/22 ?1558 03/28/22 ?2114 03/29/22 ?0615  ?GLUCAP  118* 210* 108* 136* 120*  ? ?Lipid Profile: ?No results for input(s): CHOL, HDL, LDLCALC, TRIG, CHOLHDL, LDLDIRECT in the last 72 hours. ?Thyroid Function Tests: ?No results for input(s): TSH, T4TOTAL, FREET4, T3FREE, THYROIDAB in the last 72 hours. ?Anemia Panel: ?Recent Labs  ?  03/27/22 ?6644 03/28/22 ?0401  ?VITAMINB12 335  --   ?FOLATE >40.0  --   ?FERRITIN 24  --   ?TIBC 371  --   ?IRON 26*  --   ?RETICCTPCT  --  3.6*  ? ?Urine analysis: ?   ?Component Value Date/Time  ? COLORURINE YELLOW 01/18/2021 1059  ? APPEARANCEUR CLEAR 01/18/2021 1059  ? LABSPEC 1.013 01/18/2021 1059  ? PHURINE 6.0 01/18/2021 1059  ? GLUCOSEU NEGATIVE 01/18/2021 1059  ? HGBUR NEGATIVE 01/18/2021 1059  ? [place] NEGATIVE 01/18/2021 1059  ? [employer] NEGATIVE 01/18/2021 1059  ? PROTEINUR NEGATIVE 01/18/2021 1059  ? UROBILINOGEN 0.2 06/12/2011 1201  ? NITRITE NEGATIVE 01/18/2021 1059  ? LEUKOCYTESUR NEGATIVE 01/18/2021 1059  ? ?Sepsis Labs: ?'@LABRCNTIP'$ (procalcitonin:4,lacticidven:4) ? ?)No results found for this or any previous visit (from the past 240 hour(s)).  ? ?Radiology Studies: ?ECHOCARDIOGRAM COMPLETE ? ?Result Date: 03/27/2022 ?   ECHOCARDIOGRAM REPORT   [name] Date of Exam: 03/27/2022 Medical Rec #:  [id number]        Height:       65.0 in Accession #:    [id number]       Weight:       165.3 lb Date of Birth:  04-12-1933        BSA:          1.824 m? Patient Age:    55 years         BP:           117/73 mmHg Patient Gender: F                HR:           88 bpm. Exam Location:  Inpatient Procedure: 2D Echo Indications:    acute systolic chf  History:        Patient has prior history of Echocardiogram examinations, most                 recent 11/01/2020. CHF, chronic kidney disease,                 Arrythmias:Atrial Fibrillation; Risk Factors:Diabetes,                 Hypertension and Sleep Apnea.  Sonographer:    Johny Chess RDCS Referring Phys: [place]  1. Left ventricular  ejection fraction, by estimation, is 50 to 55%. The left ventricle has low normal function. The left ventricle  demonstrates global hypokinesis. There is mild left ventricular hypertrophy. Left ventricular diastolic function could not  be evaluated. There is incoordinate septal motion.  2. Right ventricular systolic function is normal. The right ventricular size is normal. There is mildly elevated pulmonary artery systolic pressure. The estimated right ventricular systolic pressure is 83.6 mmHg.  3. Left atrial size was severely dilated.  4. Right atrial size was moderately dilated.  5. A small pericardial effusion is present. The pericardial effusion is posterior to the left ventricle.  6. The mitral valve is abnormal. Mild mitral valve regurgitation.  7. The aortic valve is tricuspid. Aortic valve regurgitation is not visualized. Aortic valve sclerosis is present, with no evidence of aortic valve stenosis.  8. The inferior vena cava is normal in size with greater than 50% respiratory variability, suggesting right atrial pressure of 3 mmHg. Comparison(s): No significant change from prior study. 11/01/2020: LVEF 50-55%. FINDINGS  Left Ventricle: Left ventricular ejection fraction, by estimation, is 50 to 55%. The left ventricle has low normal function. The left ventricle demonstrates global hypokinesis. The left ventricular internal cavity size was normal in size. There is mild left ventricular hypertrophy. Incoordinate septal motion. Left ventricular diastolic function could not be evaluated due to atrial fibrillation. Left ventricular diastolic function could not be evaluated. Right Ventricle: The right ventricular size is normal. No increase in right ventricular wall thickness. Right ventricular systolic function is normal. There is mildly elevated pulmonary artery systolic pressure. The tricuspid regurgitant velocity is 3.08  m/s, and with an assumed right atrial pressure of 3 mmHg, the estimated right  ventricular systolic pressure is 62.9 mmHg. Left Atrium: Left atrial size was severely dilated. Right Atrium: Right atrial size was moderately dilated. Pericardium: A small pericardial effusion is present. The perica

## 2022-03-29 NOTE — Progress Notes (Signed)
?Cardiology Progress Note  ?Patient ID: Anne Roach ?MRN: [id number] ?DOB: [birth year] ?Date of Encounter: 03/29/2022 ? ?Primary Cardiologist: Jenkins Rouge, MD ? ?Subjective  ? ?Chief Complaint: Dizziness ? ?HPI: Breathing improved.  Euvolemic on examination.  Reports she is dizzy this morning. ? ?ROS:  ?All other ROS reviewed and negative. Pertinent positives noted in the HPI.    ? ?Inpatient Medications  ?Scheduled Meds: ? allopurinol  100 mg Oral Daily  ? apixaban  5 mg Oral BID  ? docusate sodium  100 mg Oral BID  ? feeding supplement  237 mL Oral Q1500  ? folic acid  8,588 mcg Oral Daily  ? gabapentin  400 mg Oral TID  ? insulin aspart  0-15 Units Subcutaneous TID WC  ? insulin aspart  0-5 Units Subcutaneous QHS  ? metoprolol succinate  50 mg Oral Daily  ? nystatin   Topical BID  ? potassium chloride  40 mEq Oral Daily  ? sodium chloride flush  3 mL Intravenous Q12H  ? spironolactone  25 mg Oral Daily  ? ?Continuous Infusions: ? ?PRN Meds: ?acetaminophen **OR** acetaminophen, alum & mag hydroxide-simeth, bisacodyl, hydrALAZINE, ondansetron **OR** ondansetron (ZOFRAN) IV, oxyCODONE, polyethylene glycol, traMADol, traZODone  ? ?Vital Signs  ? ?Vitals:  ? 03/28/22 1648 03/28/22 2017 03/29/22 0006 03/29/22 0415  ?BP: 115/64 137/83 (!) 137/93 124/90  ?Pulse: 89     ?Resp: 16     ?Temp: 97.8 ?F (36.6 ?C) 98.3 ?F (36.8 ?C)  97.7 ?F (36.5 ?C)  ?TempSrc: Oral Oral  Oral  ?SpO2: 100% 100%    ?Weight:    80.3 kg  ?Height:      ? ? ?Intake/Output Summary (Last 24 hours) at 03/29/2022 0651 ?Last data filed at 03/29/2022 0500 ?Gross per 24 hour  ?Intake 832 ml  ?Output 3375 ml  ?Net -2543 ml  ? ? ?  03/29/2022  ?  4:15 AM 03/28/2022  ?  2:00 AM 03/27/2022  ?  9:40 PM  ?Last 3 Weights  ?Weight (lbs) 177 lb 178 lb 6.4 oz 180 lb 8 oz  ?Weight (kg) 80.287 kg 80.922 kg 81.874 kg  ?   ? ?Telemetry  ?Overnight telemetry shows A-fib heart rate 90-110 bpm, which I personally reviewed.  ? ?ECG  ?The most recent ECG shows A-fib heart rate  95, which I personally reviewed.  ? ?Physical Exam  ? ?Vitals:  ? 03/28/22 1648 03/28/22 2017 03/29/22 0006 03/29/22 0415  ?BP: 115/64 137/83 (!) 137/93 124/90  ?Pulse: 89     ?Resp: 16     ?Temp: 97.8 ?F (36.6 ?C) 98.3 ?F (36.8 ?C)  97.7 ?F (36.5 ?C)  ?TempSrc: Oral Oral  Oral  ?SpO2: 100% 100%    ?Weight:    80.3 kg  ?Height:      ?  ?Intake/Output Summary (Last 24 hours) at 03/29/2022 0651 ?Last data filed at 03/29/2022 0500 ?Gross per 24 hour  ?Intake 832 ml  ?Output 3375 ml  ?Net -2543 ml  ?  ? ?  03/29/2022  ?  4:15 AM 03/28/2022  ?  2:00 AM 03/27/2022  ?  9:40 PM  ?Last 3 Weights  ?Weight (lbs) 177 lb 178 lb 6.4 oz 180 lb 8 oz  ?Weight (kg) 80.287 kg 80.922 kg 81.874 kg  ?  Body mass index is 28.57 kg/m?.  ?General: Well nourished, well developed, in no acute distress ?Head: Atraumatic, normal size  ?Eyes: PEERLA, EOMI  ?Neck: Supple, no JVD ?Endocrine: No thryomegaly ?Cardiac: Normal S1, S2;  irregular rhythm ?Lungs: Clear to auscultation bilaterally, no wheezing, rhonchi or rales  ?Abd: Soft, nontender, no hepatomegaly  ?Ext: No edema, pulses 2+ ?Musculoskeletal: No deformities, BUE and BLE strength normal and equal ?Skin: Warm and dry, no rashes   ?Neuro: Alert and oriented to person, place, time, and situation, CNII-XII grossly intact, no focal deficits  ?Psych: Normal mood and affect  ? ?Labs  ?High Sensitivity Troponin:   ?Recent Labs  ?Lab 03/17/22 ?2220 03/19/22 ?2107 03/19/22 ?2317 03/27/22 ?9628 03/27/22 ?1404  ?TROPONINIHS '7 8 7 7 7  '$ ?   ?Cardiac EnzymesNo results for input(s): TROPONINI in the last 168 hours. No results for input(s): TROPIPOC in the last 168 hours.  ?Chemistry ?Recent Labs  ?Lab 03/27/22 ?3662 03/28/22 ?0401 03/29/22 ?0427  ?NA 137 137 138  ?K 4.7 3.7 4.2  ?CL 103 102 101  ?CO2 '25 24 29  '$ ?GLUCOSE 170* 100* 105*  ?BUN '12 13 15  '$ ?CREATININE 0.98 1.11* 1.22*  ?CALCIUM 8.6* 8.3* 8.8*  ?GFRNONAA 56* 48* 43*  ?ANIONGAP '9 11 8  '$ ?  ?Hematology ?Recent Labs  ?Lab 03/27/22 ?9476 03/28/22 ?0401  03/29/22 ?0427  ?WBC 7.9 6.9 8.3  ?RBC 3.47* 3.25*  3.23* 3.65*  ?HGB 9.3* 8.8* 10.0*  ?HCT 31.6* 29.5* 32.5*  ?MCV 91.1 90.8 89.0  ?MCH 26.8 27.1 27.4  ?MCHC 29.4* 29.8* 30.8  ?RDW 19.0* 18.8* 18.5*  ?PLT 267 233 251  ? ?BNP ?Recent Labs  ?Lab 03/27/22 ?0837  ?BNP 449.7*  ?  ?DDimer No results for input(s): DDIMER in the last 168 hours.  ? ?Radiology  ?DG Chest 2 View ? ?Result Date: 03/27/2022 ?CLINICAL DATA:  Shortness of breath for 2 weeks EXAM: CHEST - 2 VIEW COMPARISON:  Chest radiograph and chest CT 03/19/2022 FINDINGS: Heart is enlarged, unchanged. The upper mediastinal contours are stable. There are bilateral pleural effusions with adjacent bibasilar airspace opacities, overall slightly worsened since 03/19/2022. There is vascular congestion without definite overt pulmonary edema. There is no pneumothorax There is no acute osseous abnormality. IMPRESSION: Bilateral pleural effusions with adjacent bibasilar airspace opacities, worsened since 03/19/2022. Electronically Signed   By: Valetta Mole M.D.   On: 03/27/2022 09:03  ? ?ECHOCARDIOGRAM COMPLETE ? ?Result Date: 03/27/2022 ?   ECHOCARDIOGRAM REPORT   [name] Date of Exam: 03/27/2022 Medical Rec #:  [id number]        Height:       65.0 in Accession #:    [id number]       Weight:       165.3 lb Date of Birth:  02/08/1933        BSA:          1.824 m? Patient Age:    88 years         BP:           117/73 mmHg Patient Gender: F                HR:           88 bpm. Exam Location:  Inpatient Procedure: 2D Echo Indications:    acute systolic chf  History:        Patient has prior history of Echocardiogram examinations, most                 recent 11/01/2020. CHF, chronic kidney disease,                 Arrythmias:Atrial Fibrillation; Risk Factors:Diabetes,  Hypertension and Sleep Apnea.  Sonographer:    Johny Chess RDCS Referring Phys: [place]  1. Left ventricular ejection fraction, by estimation,  is 50 to 55%. The left ventricle has low normal function. The left ventricle demonstrates global hypokinesis. There is mild left ventricular hypertrophy. Left ventricular diastolic function could not  be evaluated. There is incoordinate septal motion.  2. Right ventricular systolic function is normal. The right ventricular size is normal. There is mildly elevated pulmonary artery systolic pressure. The estimated right ventricular systolic pressure is 77.9 mmHg.  3. Left atrial size was severely dilated.  4. Right atrial size was moderately dilated.  5. A small pericardial effusion is present. The pericardial effusion is posterior to the left ventricle.  6. The mitral valve is abnormal. Mild mitral valve regurgitation.  7. The aortic valve is tricuspid. Aortic valve regurgitation is not visualized. Aortic valve sclerosis is present, with no evidence of aortic valve stenosis.  8. The inferior vena cava is normal in size with greater than 50% respiratory variability, suggesting right atrial pressure of 3 mmHg. Comparison(s): No significant change from prior study. 11/01/2020: LVEF 50-55%. FINDINGS  Left Ventricle: Left ventricular ejection fraction, by estimation, is 50 to 55%. The left ventricle has low normal function. The left ventricle demonstrates global hypokinesis. The left ventricular internal cavity size was normal in size. There is mild left ventricular hypertrophy. Incoordinate septal motion. Left ventricular diastolic function could not be evaluated due to atrial fibrillation. Left ventricular diastolic function could not be evaluated. Right Ventricle: The right ventricular size is normal. No increase in right ventricular wall thickness. Right ventricular systolic function is normal. There is mildly elevated pulmonary artery systolic pressure. The tricuspid regurgitant velocity is 3.08  m/s, and with an assumed right atrial pressure of 3 mmHg, the estimated right ventricular systolic pressure is 39.0 mmHg.  Left Atrium: Left atrial size was severely dilated. Right Atrium: Right atrial size was moderately dilated. Pericardium: A small pericardial effusion is present. The pericardial effusion is posterior to

## 2022-03-30 ENCOUNTER — Inpatient Hospital Stay (HOSPITAL_COMMUNITY): Payer: Medicare HMO

## 2022-03-30 DIAGNOSIS — I5033 Acute on chronic diastolic (congestive) heart failure: Secondary | ICD-10-CM | POA: Diagnosis not present

## 2022-03-30 LAB — CBC
HCT: 30.4 % — ABNORMAL LOW (ref 36.0–46.0)
Hemoglobin: 9.3 g/dL — ABNORMAL LOW (ref 12.0–15.0)
MCH: 27.7 pg (ref 26.0–34.0)
MCHC: 30.6 g/dL (ref 30.0–36.0)
MCV: 90.5 fL (ref 80.0–100.0)
Platelets: 224 10*3/uL (ref 150–400)
RBC: 3.36 MIL/uL — ABNORMAL LOW (ref 3.87–5.11)
RDW: 18.2 % — ABNORMAL HIGH (ref 11.5–15.5)
WBC: 7.7 10*3/uL (ref 4.0–10.5)
nRBC: 0 % (ref 0.0–0.2)

## 2022-03-30 LAB — BASIC METABOLIC PANEL
Anion gap: 9 (ref 5–15)
BUN: 19 mg/dL (ref 8–23)
CO2: 29 mmol/L (ref 22–32)
Calcium: 8.7 mg/dL — ABNORMAL LOW (ref 8.9–10.3)
Chloride: 100 mmol/L (ref 98–111)
Creatinine, Ser: 1.32 mg/dL — ABNORMAL HIGH (ref 0.44–1.00)
GFR, Estimated: 39 mL/min — ABNORMAL LOW (ref >60)
Glucose, Bld: 106 mg/dL — ABNORMAL HIGH (ref 70–99)
Potassium: 4.5 mmol/L (ref 3.5–5.1)
Sodium: 138 mmol/L (ref 135–145)

## 2022-03-30 LAB — GLUCOSE, CAPILLARY
Glucose-Capillary: 105 mg/dL — ABNORMAL HIGH (ref 70–99)
Glucose-Capillary: 125 mg/dL — ABNORMAL HIGH (ref 70–99)
Glucose-Capillary: 149 mg/dL — ABNORMAL HIGH (ref 70–99)
Glucose-Capillary: 145 mg/dL — ABNORMAL HIGH (ref 70–99)

## 2022-03-30 MED ORDER — LIDOCAINE 5 % EX PTCH
1.0000 | MEDICATED_PATCH | CUTANEOUS | Status: DC
  Administered 2022-03-30 – 2022-04-02 (×4): 1 via TRANSDERMAL
  Filled 2022-03-30 (×4): qty 1

## 2022-03-30 MED ORDER — POLYETHYLENE GLYCOL 3350 17 G PO PACK
17.0000 g | PACK | Freq: Every day | ORAL | Status: DC
  Administered 2022-03-30: 17 g via ORAL
  Filled 2022-03-30: qty 1

## 2022-03-30 MED ORDER — FUROSEMIDE 10 MG/ML IJ SOLN
40.0000 mg | Freq: Two times a day (BID) | INTRAMUSCULAR | Status: AC
  Administered 2022-03-30 (×2): 40 mg via INTRAVENOUS
  Filled 2022-03-30 (×2): qty 4

## 2022-03-30 NOTE — Plan of Care (Signed)
?  Problem: Education: ?Goal: Ability to demonstrate management of disease process will improve ?Outcome: Progressing ?Goal: Ability to verbalize understanding of medication therapies will improve ?Outcome: Progressing ?Goal: Individualized Educational Video(s) ?Outcome: Progressing ?  ?Problem: Activity: ?Goal: Capacity to carry out activities will improve ?Outcome: Progressing ?  ?Problem: Cardiac: ?Goal: Ability to achieve and maintain adequate cardiopulmonary perfusion will improve ?Outcome: Progressing ?  ?Problem: Health Behavior/Discharge Planning: ?Goal: Ability to manage health-related needs will improve ?Outcome: Progressing ?  ?

## 2022-03-30 NOTE — Progress Notes (Signed)
?Cardiology Progress Note  ?Patient ID: Anne Roach ?MRN: [id number] ?DOB: 1933-09-28 ?Date of Encounter: 03/30/2022 ? ?Primary Cardiologist: Anne Rouge, MD ? ?Subjective  ? ?Chief Complaint: None.  ? ?HPI: Shortness of breath improving.  Euvolemic on exam.  Chest x-ray still with pleural effusions but good urine output on p.o. diuretic. ? ?ROS:  ?All other ROS reviewed and negative. Pertinent positives noted in the HPI.    ? ?Inpatient Medications  ?Scheduled Meds: ? allopurinol  100 mg Oral Daily  ? apixaban  5 mg Oral BID  ? docusate sodium  100 mg Oral BID  ? empagliflozin  10 mg Oral Daily  ? feeding supplement  237 mL Oral Q1500  ? folic acid  7,619 mcg Oral Daily  ? furosemide  40 mg Oral Daily  ? gabapentin  300 mg Oral TID  ? insulin aspart  0-15 Units Subcutaneous TID WC  ? insulin aspart  0-5 Units Subcutaneous QHS  ? metoprolol succinate  50 mg Oral Daily  ? nystatin   Topical BID  ? sodium chloride flush  3 mL Intravenous Q12H  ? spironolactone  25 mg Oral Daily  ? ?Continuous Infusions: ? ?PRN Meds: ?acetaminophen **OR** acetaminophen, alum & mag hydroxide-simeth, bisacodyl, hydrALAZINE, ondansetron **OR** ondansetron (ZOFRAN) IV, oxyCODONE, polyethylene glycol, traMADol, traZODone  ? ?Vital Signs  ? ?Vitals:  ? 03/29/22 1945 03/30/22 0322 03/30/22 0330 03/30/22 0610  ?BP: 111/70 124/66    ?Pulse:      ?Resp:      ?Temp: 97.8 ?F (36.6 ?C) 98 ?F (36.7 ?C)    ?TempSrc: Oral Oral    ?SpO2:   90%   ?Weight:    79.5 kg  ?Height:      ? ? ?Intake/Output Summary (Last 24 hours) at 03/30/2022 0738 ?Last data filed at 03/30/2022 0610 ?Gross per 24 hour  ?Intake 952 ml  ?Output 2600 ml  ?Net -1648 ml  ? ? ?  03/30/2022  ?  6:10 AM 03/29/2022  ?  4:15 AM 03/28/2022  ?  2:00 AM  ?Last 3 Weights  ?Weight (lbs) 175 lb 4.8 oz 177 lb 178 lb 6.4 oz  ?Weight (kg) 79.516 kg 80.287 kg 80.922 kg  ?   ? ?Telemetry  ?Overnight telemetry shows Afib 80-90 bpm, which I personally reviewed.  ? ? ?Physical Exam  ? ?Vitals:  ?  03/29/22 1945 03/30/22 0322 03/30/22 0330 03/30/22 0610  ?BP: 111/70 124/66    ?Pulse:      ?Resp:      ?Temp: 97.8 ?F (36.6 ?C) 98 ?F (36.7 ?C)    ?TempSrc: Oral Oral    ?SpO2:   90%   ?Weight:    79.5 kg  ?Height:      ?  ?Intake/Output Summary (Last 24 hours) at 03/30/2022 0738 ?Last data filed at 03/30/2022 0610 ?Gross per 24 hour  ?Intake 952 ml  ?Output 2600 ml  ?Net -1648 ml  ?  ? ?  03/30/2022  ?  6:10 AM 03/29/2022  ?  4:15 AM 03/28/2022  ?  2:00 AM  ?Last 3 Weights  ?Weight (lbs) 175 lb 4.8 oz 177 lb 178 lb 6.4 oz  ?Weight (kg) 79.516 kg 80.287 kg 80.922 kg  ?  Body mass index is 28.29 kg/m?.  ?General: Well nourished, well developed, in no acute distress ?Head: Atraumatic, normal size  ?Eyes: PEERLA, EOMI  ?Neck: Supple, no JVD ?Endocrine: No thryomegaly ?Cardiac: Normal S1, S2; irregular rhythm, no murmur ?Lungs: Diminished breath sounds bilaterally ?  Abd: Soft, nontender, no hepatomegaly  ?Ext: No edema, pulses 2+ ?Musculoskeletal: No deformities, BUE and BLE strength normal and equal ?Skin: Warm and dry, no rashes   ?Neuro: Alert and oriented to person, place, time, and situation, CNII-XII grossly intact, no focal deficits  ?Psych: Normal mood and affect  ? ?Labs  ?High Sensitivity Troponin:   ?Recent Labs  ?Lab 03/17/22 ?2220 03/19/22 ?2107 03/19/22 ?2317 03/27/22 ?1610 03/27/22 ?1404  ?TROPONINIHS '7 8 7 7 7  '$ ?   ?Cardiac EnzymesNo results for input(s): TROPONINI in the last 168 hours. No results for input(s): TROPIPOC in the last 168 hours.  ?Chemistry ?Recent Labs  ?Lab 03/28/22 ?0401 03/29/22 ?9604 03/30/22 ?5409  ?NA 137 138 138  ?K 3.7 4.2 4.5  ?CL 102 101 100  ?CO2 '24 29 29  '$ ?GLUCOSE 100* 105* 106*  ?BUN '13 15 19  '$ ?CREATININE 1.11* 1.22* 1.32*  ?CALCIUM 8.3* 8.8* 8.7*  ?GFRNONAA 48* 43* 39*  ?ANIONGAP '11 8 9  '$ ?  ?Hematology ?Recent Labs  ?Lab 03/28/22 ?0401 03/29/22 ?8119 03/30/22 ?1478  ?WBC 6.9 8.3 7.7  ?RBC 3.25*  3.23* 3.65* 3.36*  ?HGB 8.8* 10.0* 9.3*  ?HCT 29.5* 32.5* 30.4*  ?MCV 90.8 89.0 90.5   ?MCH 27.1 27.4 27.7  ?MCHC 29.8* 30.8 30.6  ?RDW 18.8* 18.5* 18.2*  ?PLT 233 251 224  ? ?BNP ?Recent Labs  ?Lab 03/27/22 ?0837  ?BNP 449.7*  ?  ?DDimer No results for input(s): DDIMER in the last 168 hours.  ? ?Radiology  ?DG CHEST PORT 1 VIEW ? ?Result Date: 03/29/2022 ?CLINICAL DATA:  Shortness of breath. EXAM: PORTABLE CHEST 1 VIEW COMPARISON:  03/27/2022 FINDINGS: 0804 hours. The cardio pericardial silhouette is enlarged. Bibasilar atelectasis/infiltrate is similar to prior with small bilateral pleural effusions. Telemetry leads overlie the chest. IMPRESSION: Stable exam. Bibasilar atelectasis/infiltrate with small bilateral pleural effusions. Electronically Signed   By: Anne Roach M.D.   On: 03/29/2022 10:52   ? ?Cardiac Studies  ?TTE 03/27/2022 ? 1. Left ventricular ejection fraction, by estimation, is 50 to 55%. The  ?left ventricle has low normal function. The left ventricle demonstrates  ?global hypokinesis. There is mild left ventricular hypertrophy. Left  ?ventricular diastolic function could not  ? be evaluated. There is incoordinate septal motion.  ? 2. Right ventricular systolic function is normal. The right ventricular  ?size is normal. There is mildly elevated pulmonary artery systolic  ?pressure. The estimated right ventricular systolic pressure is 29.5 mmHg.  ? 3. Left atrial size was severely dilated.  ? 4. Right atrial size was moderately dilated.  ? 5. A small pericardial effusion is present. The pericardial effusion is  ?posterior to the left ventricle.  ? 6. The mitral valve is abnormal. Mild mitral valve regurgitation.  ? 7. The aortic valve is tricuspid. Aortic valve regurgitation is not  ?visualized. Aortic valve sclerosis is present, with no evidence of aortic  ?valve stenosis.  ? 8. The inferior vena cava is normal in size with greater than 50%  ?respiratory variability, suggesting right atrial pressure of 3 mmHg.  ? ?Patient Profile  ?Anne Roach is a 88 y.o. female with  hypertension, diabetes, permanent atrial fibrillation, diastolic heart failure who was admitted on 03/27/2022 for acute on chronic diastolic heart failure. ? ?Assessment & Plan  ? ?#Acute on chronic diastolic heart failure, EF 50 to 55% ?-Transition to oral Lasix yesterday.  Good urine output.  Net -1.6 L.  She is net -5 L since admission.  Appears euvolemic  to me.  We will continue oral diuretic. ?-Chest x-ray with persistent pleural effusion.  Oxygen saturation is 90% on room air.  We will continue with oral diuretics.  Could consider noncontrast chest CT to determine if any of these effusions are loculated.  She is having good urine output seems to be doing better. ?-Continue Aldactone 25 mg daily.  Jardiance 10 mg daily added. ?-Discharge as of today or tomorrow.  ? ?#Pleural effusions ?-Chest x-ray without much improvement.  Net -5 L since admission.  Good urine output on oral diuretics.  We will discontinue this for now.  Her oxygen requirement is 90% on room air.  Could consider noncontrast chest CT to determine if the effusions are loculated.  She may merit thoracentesis.  We will discuss this with the hospital medicine team. ? ?#Permanent atrial fibrillation ?-Rate controlled on 50 mg of metoprolol succinate.  Continue Eliquis 5 mg twice daily.   ? ?For questions or updates, please contact [place] ?Please consult [email or web address] for contact info under  ? ?Signed, ?[place] T. Audie Box, MD, [facility] ?[place]  ?03/30/2022 7:38 AM  ? ?

## 2022-03-30 NOTE — Progress Notes (Signed)
?PROGRESS NOTE ? ? ? ?Anne Roach  ZOX:[id number] DOB: 11/18/1932 DOA: 03/27/2022 ?PCP: Lin Landsman, MD  ? ? Anne Roach is a 88 y.o. female with medical history significant of PAF on Eliquis; chronic diastolic CHF; DM; and HTN presenting with SOB. Symptoms started on 5/1.  Had multiple ED trips in the last week for dyspnea, often self-limiting. ?-History of dyspnea on exertion, chronic lower extremity edema, in the ED BNP was 449, troponin was negative, chest x-ray noted bilateral pleural effusions, noted to be in A-fib with heart rate in the 120, 130 range ? ?Subjective: ?-Feels dizzy this morning breathing has improved ? ?Assessment and Plan: ? ?Acute on chronic diastolic CHF ?Bilateral pleural effusions ?-Third ED visit since 5/11 for dyspnea  ?-Chest x-ray with bilateral pleural effusion and pulmonary vascular congestion  ?-Echo with EF of 50%, mild LVH, indeterminate diastolic parameters, mild PAH ?-Diuresed with IV Lasix she is 5L negative, weight down 5-8lbs ?-Clinically appears slightly volume overloaded, discussed with cards will redose IV Lasix today, agree with noncontrast CT chest ?-Started on Aldactone and Farxiga ?-BMP in a.m., increase activity as tolerated ?  ?Afib ?-Rate relatively controlled, cards following switched from diltiazem to metoprolol, EF felt to be slightly lower ?-Continue Eliquis ? ?Low back pain ?-Add lidocaine patch ?  ?DM ?-CBGs are stable, Glucophage on hold ?-Continue moderate-scale SSI  ? ?Acute on chronic anemia ?-No overt bleeding, stable, monitor ?  ?Chronic pain ?-Continue Ultram, Neurontin ?  ?Stage 3a CKD ?-Appears relatively stable ? ?DVT prophylaxis: Eliquis ?CODE STATUS: Full code ?Family communication: Discussed with patient detail, no family at bedside ?Disposition, home likely 1 to 2 days ?  ? ?Procedures:  ? ?Antimicrobials:  ? ? ?Objective: ?Vitals:  ? 03/30/22 0330 03/30/22 0610 03/30/22 0959 03/30/22 1000  ?BP:   118/72 118/72  ?Pulse:   90 72  ?Resp:    18   ?Temp:   98.9 ?F (37.2 ?C)   ?TempSrc:   Oral   ?SpO2: 90%  90%   ?Weight:  79.5 kg    ?Height:      ? ? ?Intake/Output Summary (Last 24 hours) at 03/30/2022 1103 ?Last data filed at 03/30/2022 1025 ?Gross per 24 hour  ?Intake 948 ml  ?Output 2600 ml  ?Net -1652 ml  ? ?Filed Weights  ? 03/28/22 0200 03/29/22 0415 03/30/22 0610  ?Weight: 80.9 kg 80.3 kg 79.5 kg  ? ? ?Examination: ? ?General exam: Obese elderly female sitting up in bed, AAOx3, slightly uncomfortable appearing ?HEENT: Positive JVD ?CVS: S1-S2, irregularly irregular rhythm ?Lungs: Fine basilar rales ?Abdomen: Soft, nontender, bowel sounds present ?Extremities: Trace edema  ?Skin: No rashes ?Psychiatry:  Mood & affect appropriate.  ? ? ? ?Data Reviewed:  ? ?CBC: ?Recent Labs  ?Lab 03/27/22 ?8119 03/28/22 ?0401 03/29/22 ?0427 03/30/22 ?1478  ?WBC 7.9 6.9 8.3 7.7  ?NEUTROABS 5.3  --   --   --   ?HGB 9.3* 8.8* 10.0* 9.3*  ?HCT 31.6* 29.5* 32.5* 30.4*  ?MCV 91.1 90.8 89.0 90.5  ?PLT 267 233 251 224  ? ?Basic Metabolic Panel: ?Recent Labs  ?Lab 03/27/22 ?2956 03/27/22 ?1400 03/28/22 ?0401 03/29/22 ?0427 03/30/22 ?2130  ?NA 137  --  137 138 138  ?K 4.7  --  3.7 4.2 4.5  ?CL 103  --  102 101 100  ?CO2 25  --  '24 29 29  '$ ?GLUCOSE 170*  --  100* 105* 106*  ?BUN 12  --  '13 15 19  '$ ?CREATININE  0.98  --  1.11* 1.22* 1.32*  ?CALCIUM 8.6*  --  8.3* 8.8* 8.7*  ?MG  --  2.4  --   --   --   ? ?GFR: ?Estimated Creatinine Clearance: 31.9 mL/min (A) (by C-G formula based on SCr of 1.32 mg/dL (H)). ?Liver Function Tests: ?No results for input(s): AST, ALT, ALKPHOS, BILITOT, PROT, ALBUMIN in the last 168 hours. ?No results for input(s): LIPASE, AMYLASE in the last 168 hours. ?No results for input(s): AMMONIA in the last 168 hours. ?Coagulation Profile: ?No results for input(s): INR, PROTIME in the last 168 hours. ?Cardiac Enzymes: ?No results for input(s): CKTOTAL, CKMB, CKMBINDEX, TROPONINI in the last 168 hours. ?BNP (last 3 results) ?No results for input(s): PROBNP in the  last 8760 hours. ?HbA1C: ?Recent Labs  ?  03/27/22 ?1244  ?HGBA1C 6.1*  ? ?CBG: ?Recent Labs  ?Lab 03/29/22 ?1118 03/29/22 ?1704 03/29/22 ?2111 03/30/22 ?8101 03/30/22 ?1043  ?GLUCAP 180* 103* 127* 105* 125*  ? ?Lipid Profile: ?No results for input(s): CHOL, HDL, LDLCALC, TRIG, CHOLHDL, LDLDIRECT in the last 72 hours. ?Thyroid Function Tests: ?No results for input(s): TSH, T4TOTAL, FREET4, T3FREE, THYROIDAB in the last 72 hours. ?Anemia Panel: ?Recent Labs  ?  03/28/22 ?0401  ?RETICCTPCT 3.6*  ? ?Urine analysis: ?   ?Component Value Date/Time  ? COLORURINE YELLOW 01/18/2021 1059  ? APPEARANCEUR CLEAR 01/18/2021 1059  ? LABSPEC 1.013 01/18/2021 1059  ? PHURINE 6.0 01/18/2021 1059  ? GLUCOSEU NEGATIVE 01/18/2021 1059  ? HGBUR NEGATIVE 01/18/2021 1059  ? [place] NEGATIVE 01/18/2021 1059  ? [place] NEGATIVE 01/18/2021 1059  ? PROTEINUR NEGATIVE 01/18/2021 1059  ? UROBILINOGEN 0.2 06/12/2011 1201  ? NITRITE NEGATIVE 01/18/2021 1059  ? LEUKOCYTESUR NEGATIVE 01/18/2021 1059  ? ?Sepsis Labs: ?'@LABRCNTIP'$ (procalcitonin:4,lacticidven:4) ? ?)No results found for this or any previous visit (from the past 240 hour(s)).  ? ?Radiology Studies: ?CT CHEST WO CONTRAST ? ?Result Date: 03/30/2022 ?CLINICAL DATA:  Pneumonia.  Possible pleural effusion. EXAM: CT CHEST WITHOUT CONTRAST TECHNIQUE: Multidetector CT imaging of the chest was performed following the standard protocol without IV contrast. RADIATION DOSE REDUCTION: This exam was performed according to the departmental dose-optimization program which includes automated exposure control, adjustment of the mA and/or kV according to patient size and/or use of iterative reconstruction technique. COMPARISON:  03/19/2022. FINDINGS: Cardiovascular: Heart is mildly enlarged with a left atrial predominance. Small pericardial effusion. Three-vessel coronary artery calcifications. Great vessels are normal in caliber. Stable aortic atherosclerotic calcifications. Mediastinum/Nodes:  Prominent to mildly enlarged mediastinal lymph nodes, largest a right subcarinal node measuring 1.8 cm in short axis, without change. No mediastinal or hilar masses. Trachea and esophagus are unremarkable. Lungs/Pleura: Moderate right and trace left pleural effusions. Lung consolidation and/or atelectasis, right lower lobe tracking along the segmental bronchi, and to a lesser degree in the left lower lobe and medial aspects of the left upper lobe lingula and right middle lobe, similar to the recent prior CT. Remainder of the lungs is clear. No evidence of pulmonary edema. No pneumothorax. Upper Abdomen: No acute abnormality. Musculoskeletal: No fracture or acute finding. No bone lesion. No chest wall mass. IMPRESSION: 1. No significant change from the recent prior chest CT. 2. Cardiomegaly, small pericardial effusion, moderate right and trace left pleural effusions. Dependent lung opacity, most prominently in the right lower lobe, likely atelectasis. Pneumonia is possible. No evidence of pulmonary edema. Mildly enlarged right subcarinal lymph node, presumed reactive. 3. Coronary artery calcifications and aortic atherosclerosis. Aortic Atherosclerosis (ICD10-I70.0). Electronically Signed  By: Lajean Manes M.D.   On: 03/30/2022 10:19  ? ?DG CHEST PORT 1 VIEW ? ?Result Date: 03/29/2022 ?CLINICAL DATA:  Shortness of breath. EXAM: PORTABLE CHEST 1 VIEW COMPARISON:  03/27/2022 FINDINGS: 0804 hours. The cardio pericardial silhouette is enlarged. Bibasilar atelectasis/infiltrate is similar to prior with small bilateral pleural effusions. Telemetry leads overlie the chest. IMPRESSION: Stable exam. Bibasilar atelectasis/infiltrate with small bilateral pleural effusions. Electronically Signed   By: Misty Stanley M.D.   On: 03/29/2022 10:52   ? ? ?Scheduled Meds: ? allopurinol  100 mg Oral Daily  ? apixaban  5 mg Oral BID  ? docusate sodium  100 mg Oral BID  ? empagliflozin  10 mg Oral Daily  ? feeding supplement  237 mL Oral  Q1500  ? folic acid  1,224 mcg Oral Daily  ? furosemide  40 mg Intravenous BID  ? gabapentin  300 mg Oral TID  ? insulin aspart  0-15 Units Subcutaneous TID WC  ? insulin aspart  0-5 Units Subcutaneous Q

## 2022-03-31 ENCOUNTER — Other Ambulatory Visit (HOSPITAL_COMMUNITY): Payer: Self-pay

## 2022-03-31 ENCOUNTER — Inpatient Hospital Stay (HOSPITAL_COMMUNITY): Payer: Medicare HMO

## 2022-03-31 DIAGNOSIS — I4821 Permanent atrial fibrillation: Secondary | ICD-10-CM | POA: Diagnosis not present

## 2022-03-31 DIAGNOSIS — J90 Pleural effusion, not elsewhere classified: Secondary | ICD-10-CM

## 2022-03-31 DIAGNOSIS — R1310 Dysphagia, unspecified: Secondary | ICD-10-CM

## 2022-03-31 DIAGNOSIS — I5033 Acute on chronic diastolic (congestive) heart failure: Secondary | ICD-10-CM | POA: Diagnosis not present

## 2022-03-31 LAB — BASIC METABOLIC PANEL
Anion gap: 10 (ref 5–15)
BUN: 22 mg/dL (ref 8–23)
CO2: 31 mmol/L (ref 22–32)
Calcium: 8.8 mg/dL — ABNORMAL LOW (ref 8.9–10.3)
Chloride: 97 mmol/L — ABNORMAL LOW (ref 98–111)
Creatinine, Ser: 1.28 mg/dL — ABNORMAL HIGH (ref 0.44–1.00)
GFR, Estimated: 41 mL/min — ABNORMAL LOW (ref >60)
Glucose, Bld: 104 mg/dL — ABNORMAL HIGH (ref 70–99)
Potassium: 3.6 mmol/L (ref 3.5–5.1)
Sodium: 138 mmol/L (ref 135–145)

## 2022-03-31 LAB — CBC
HCT: 30.5 % — ABNORMAL LOW (ref 36.0–46.0)
Hemoglobin: 9.3 g/dL — ABNORMAL LOW (ref 12.0–15.0)
MCH: 27 pg (ref 26.0–34.0)
MCHC: 30.5 g/dL (ref 30.0–36.0)
MCV: 88.4 fL (ref 80.0–100.0)
Platelets: 233 10*3/uL (ref 150–400)
RBC: 3.45 MIL/uL — ABNORMAL LOW (ref 3.87–5.11)
RDW: 18.1 % — ABNORMAL HIGH (ref 11.5–15.5)
WBC: 7.4 10*3/uL (ref 4.0–10.5)
nRBC: 0 % (ref 0.0–0.2)

## 2022-03-31 LAB — GLUCOSE, CAPILLARY
Glucose-Capillary: 113 mg/dL — ABNORMAL HIGH (ref 70–99)
Glucose-Capillary: 134 mg/dL — ABNORMAL HIGH (ref 70–99)
Glucose-Capillary: 162 mg/dL — ABNORMAL HIGH (ref 70–99)
Glucose-Capillary: 171 mg/dL — ABNORMAL HIGH (ref 70–99)

## 2022-03-31 MED ORDER — FUROSEMIDE 10 MG/ML IJ SOLN
20.0000 mg | Freq: Two times a day (BID) | INTRAMUSCULAR | Status: AC
  Administered 2022-03-31: 20 mg via INTRAVENOUS
  Filled 2022-03-31 (×2): qty 2

## 2022-03-31 MED ORDER — POLYETHYLENE GLYCOL 3350 17 G PO PACK: 17.0000 g | PACK | Freq: Every day | ORAL | Status: DC | PRN

## 2022-03-31 MED ORDER — TRAMADOL HCL 50 MG PO TABS
50.0000 mg | ORAL_TABLET | Freq: Two times a day (BID) | ORAL | Status: DC | PRN
  Administered 2022-03-31 – 2022-04-02 (×5): 50 mg via ORAL
  Filled 2022-03-31 (×5): qty 1

## 2022-03-31 MED ORDER — POTASSIUM CHLORIDE CRYS ER 20 MEQ PO TBCR
40.0000 meq | EXTENDED_RELEASE_TABLET | Freq: Once | ORAL | Status: AC
  Administered 2022-03-31: 40 meq via ORAL
  Filled 2022-03-31: qty 2

## 2022-03-31 MED ORDER — DICLOFENAC SODIUM 1 % EX GEL
4.0000 g | Freq: Two times a day (BID) | CUTANEOUS | Status: DC
  Administered 2022-03-31 (×2): 4 g via TOPICAL
  Filled 2022-03-31: qty 100

## 2022-03-31 NOTE — Evaluation (Signed)
Clinical/Bedside Swallow Evaluation ?Patient Details  ?Name: Anne Roach ?MRN: [id number] ?Date of Birth: 07/05/1933 ? ?Today's Date: 03/31/2022 ?Time: SLP Start Time (ACUTE ONLY): 0370 SLP Stop Time (ACUTE ONLY): 1300 ?SLP Time Calculation (min) (ACUTE ONLY): 20 min ? ?Past Medical History:  ?Past Medical History:  ?Diagnosis Date  ? Atrial fibrillation ([place])   ? Chronic diastolic CHF (congestive heart failure) ([place]) 10/31/2020  ? DM (diabetes mellitus) ([place])   ? HTN (hypertension)   ? Tremor   ? ?Past Surgical History:  ?Past Surgical History:  ?Procedure Laterality Date  ? APPENDECTOMY    ? CARPAL TUNNEL RELEASE    ? CATARACT EXTRACTION Bilateral   ? HERNIA REPAIR    ? ?HPI:  ?The pt is an 88 yo female presenting 5/11 with increasing SOB. Pt with x3 admissions for same complaint since 5/1. Chest X-ray shows bilateral pleural effusions, pt with acute on chronic CHF exacerbation. Pt with dysphagia/vomiting on 5/14. PMH includes: afib, CHF, HTN, and DM II.  ?  ?Assessment / Plan / Recommendation  ?Clinical Impression ? Pt demonstrates no signs of dysphagia today. She reports that yesterday she felt her food lodge in her throat and then drank water to help it pass, which did seem to help, but then she regurgitated all the water. Today however, she has been able to eat and pt did just finish a plate of tilapia with no trouble. SLP discussed basic strategies with her, particularly sitting up in chair for meals. If problem reoccurs, would suggest an esophageal study or referral to GI. For now, no further SLP interventions are needed. ?SLP Visit Diagnosis: Dysphagia, unspecified (R13.10) ?   ?Aspiration Risk ? Mild aspiration risk  ?  ?Diet Recommendation Regular;Thin liquid  ? ?Liquid Administration via: Cup;Straw  ?  ?Other  Recommendations     ? ?Recommendations for follow up therapy are one component of a multi-disciplinary discharge planning process, led by the attending physician.  Recommendations may be updated  based on patient status, additional functional criteria and insurance authorization. ? ?Follow up Recommendations No SLP follow up  ? ? ?  ?Assistance Recommended at Discharge    ?Functional Status Assessment    ?Frequency and Duration    ?  ?  ?   ? ?Prognosis    ? ?  ? ?Swallow Study   ?General HPI: The pt is an 88 yo female presenting 5/11 with increasing SOB. Pt with x3 admissions for same complaint since 5/1. Chest X-ray shows bilateral pleural effusions, pt with acute on chronic CHF exacerbation. Pt with dysphagia/vomiting on 5/14. PMH includes: afib, CHF, HTN, and DM II. ?Type of Study: Bedside Swallow Evaluation ?Diet Prior to this Study: Regular;Thin liquids ?Temperature Spikes Noted: No ?Respiratory Status: Room air ?History of Recent Intubation: No ?Behavior/Cognition: Alert;Cooperative;Pleasant mood ?Oral Cavity Assessment: Within Functional Limits ?Oral Care Completed by SLP: No ?Oral Cavity - Dentition: Adequate natural dentition ?Vision: Functional for self-feeding ?Self-Feeding Abilities: Able to feed self ?Patient Positioning: Upright in bed ?Baseline Vocal Quality: Normal ?Volitional Cough: Strong ?Volitional Swallow: Able to elicit  ?  ?Oral/Motor/Sensory Function Overall Oral Motor/Sensory Function: Within functional limits   ?Ice Chips     ?Thin Liquid Thin Liquid: Within functional limits  ?  ?Nectar Thick Nectar Thick Liquid: Not tested   ?Honey Thick Honey Thick Liquid: Not tested   ?Puree Puree: Within functional limits   ?Solid ? ? ?  Solid: Within functional limits  ? ?  ? ?Anne Roach, Anne Roach ?03/31/2022,2:31 PM ? ? ? ?

## 2022-03-31 NOTE — Progress Notes (Signed)
Heart Failure Nurse Navigator Progress Note  ? ?Patient original HF [facility] hospital follow up appointment has been changed to 04/09/2022 @ 3 pm, due to patient is still hospitalized. Spoke with patients daughter and confirmed new appointment.  ? ?Earnestine Leys, BSN, RN ?Heart Failure Nurse Navigator ?Secure Chat Only  ?

## 2022-03-31 NOTE — TOC Benefit Eligibility Note (Signed)
Patient Advocate Encounter ? ?Insurance verification completed.   ? ?The patient is currently admitted and upon discharge could be taking Jardiance 10 mg. ? ?The current 30 day co-pay is, $4.30.  ? ?The patient is insured through [employer] Part D  ? ? ? ?Lyndel Safe, CPhT ?Pharmacy Patient Advocate Specialist ?[place] Patient Advocate Team ?Direct Number: [phone]  Fax: [phone] ? ? ? ? ? ?  ?

## 2022-03-31 NOTE — Care Management Important Message (Signed)
Important Message ? ?Patient Details  ?Name: Anne Roach ?MRN: [id number] ?Date of Birth: 12-29-1932 ? ? ?Medicare Important Message Given:  Yes ? ? ? ? ?Shelda Altes ?03/31/2022, 8:45 AM ?

## 2022-03-31 NOTE — Progress Notes (Addendum)
?Cardiology Progress Note  ?Patient ID: Anne Roach ?MRN: [id number] ?DOB: 06-Jun-1933 ?Date of Encounter: 03/31/2022 ? ?Primary Cardiologist: Jenkins Rouge, MD ? ?Subjective  ? ?Reports leg pain this morning.  States that she has been having dysphagia and vomiting when she eats.  Net -1.3 L yesterday on IV Lasix 40 mg twice daily, -6.4 L on admission.  Stable creatinine (1.22 > 1.32 > 1.28).  Weight down 2 pounds from yesterday, down 7 pounds from admission.  BP 114/57 this morning. ? ?Inpatient Medications  ?Scheduled Meds: ? allopurinol  100 mg Oral Daily  ? apixaban  5 mg Oral BID  ? diclofenac Sodium  4 g Topical BID  ? docusate sodium  100 mg Oral BID  ? empagliflozin  10 mg Oral Daily  ? feeding supplement  237 mL Oral Q1500  ? folic acid  5,784 mcg Oral Daily  ? furosemide  20 mg Intravenous BID  ? gabapentin  300 mg Oral TID  ? insulin aspart  0-15 Units Subcutaneous TID WC  ? insulin aspart  0-5 Units Subcutaneous QHS  ? lidocaine  1 patch Transdermal Q24H  ? metoprolol succinate  50 mg Oral Daily  ? nystatin   Topical BID  ? sodium chloride flush  3 mL Intravenous Q12H  ? spironolactone  25 mg Oral Daily  ? ?Continuous Infusions: ? ?PRN Meds: ?acetaminophen **OR** acetaminophen, alum & mag hydroxide-simeth, bisacodyl, hydrALAZINE, ondansetron **OR** ondansetron (ZOFRAN) IV, oxyCODONE, polyethylene glycol, traMADol, traZODone  ? ?Vital Signs  ? ?Vitals:  ? 03/30/22 1922 03/31/22 0203 03/31/22 0248 03/31/22 0851  ?BP: 116/80  (!) 94/51 (!) 114/57  ?Pulse: 95  80 99  ?Resp: 18  18   ?Temp: 98.3 ?F (36.8 ?C)  98.9 ?F (37.2 ?C)   ?TempSrc: Oral  Oral   ?SpO2: 92%  92%   ?Weight:  78.6 kg    ?Height:      ? ? ?Intake/Output Summary (Last 24 hours) at 03/31/2022 1021 ?Last data filed at 03/31/2022 0845 ?Gross per 24 hour  ?Intake 637 ml  ?Output 2100 ml  ?Net -1463 ml  ? ? ? ?  03/31/2022  ?  2:03 AM 03/30/2022  ?  6:10 AM 03/29/2022  ?  4:15 AM  ?Last 3 Weights  ?Weight (lbs) 173 lb 4.8 oz 175 lb 4.8 oz 177 lb   ?Weight (kg) 78.608 kg 79.516 kg 80.287 kg  ?   ? ?Telemetry  ?Overnight telemetry shows Afib 90s to 100s bpm, which I personally reviewed.  ? ? ?Physical Exam  ? ?Vitals:  ? 03/30/22 1922 03/31/22 0203 03/31/22 0248 03/31/22 0851  ?BP: 116/80  (!) 94/51 (!) 114/57  ?Pulse: 95  80 99  ?Resp: 18  18   ?Temp: 98.3 ?F (36.8 ?C)  98.9 ?F (37.2 ?C)   ?TempSrc: Oral  Oral   ?SpO2: 92%  92%   ?Weight:  78.6 kg    ?Height:      ?  ?Intake/Output Summary (Last 24 hours) at 03/31/2022 1021 ?Last data filed at 03/31/2022 0845 ?Gross per 24 hour  ?Intake 637 ml  ?Output 2100 ml  ?Net -1463 ml  ? ?  ? ?  03/31/2022  ?  2:03 AM 03/30/2022  ?  6:10 AM 03/29/2022  ?  4:15 AM  ?Last 3 Weights  ?Weight (lbs) 173 lb 4.8 oz 175 lb 4.8 oz 177 lb  ?Weight (kg) 78.608 kg 79.516 kg 80.287 kg  ?  Body mass index is 27.97 kg/m?Marland Kitchen  ?  General:  in no acute distress ?Head: Atraumatic, normal size  ?Neck: Supple, no JVD ?Cardiac: Normal S1, S2; irregular rhythm, no murmur ?Lungs: Diminished breath sounds R base ?Abd: Soft, nontender, no hepatomegaly  ?Ext: No edema ?Musculoskeletal: No deformities, ?Skin: Warm and dry, no rashes   ?Neuro:no focal deficits  ?Psych: Normal mood and affect  ? ?Labs  ?High Sensitivity Troponin:   ?Recent Labs  ?Lab 03/17/22 ?2220 03/19/22 ?2107 03/19/22 ?2317 03/27/22 ?8850 03/27/22 ?1404  ?TROPONINIHS '7 8 7 7 7  '$ ? ?   ?Cardiac EnzymesNo results for input(s): TROPONINI in the last 168 hours. No results for input(s): TROPIPOC in the last 168 hours.  ?Chemistry ?Recent Labs  ?Lab 03/29/22 ?0427 03/30/22 ?2774 03/31/22 ?1287  ?NA 138 138 138  ?K 4.2 4.5 3.6  ?CL 101 100 97*  ?CO2 '29 29 31  '$ ?GLUCOSE 105* 106* 104*  ?BUN '15 19 22  '$ ?CREATININE 1.22* 1.32* 1.28*  ?CALCIUM 8.8* 8.7* 8.8*  ?GFRNONAA 43* 39* 41*  ?ANIONGAP '8 9 10  '$ ? ?  ?Hematology ?Recent Labs  ?Lab 03/29/22 ?0427 03/30/22 ?8676 03/31/22 ?7209  ?WBC 8.3 7.7 7.4  ?RBC 3.65* 3.36* 3.45*  ?HGB 10.0* 9.3* 9.3*  ?HCT 32.5* 30.4* 30.5*  ?MCV 89.0 90.5 88.4  ?MCH 27.4  27.7 27.0  ?MCHC 30.8 30.6 30.5  ?RDW 18.5* 18.2* 18.1*  ?PLT 251 224 233  ? ? ?BNP ?Recent Labs  ?Lab 03/27/22 ?0837  ?BNP 449.7*  ? ?  ?DDimer No results for input(s): DDIMER in the last 168 hours.  ? ?Radiology  ?CT CHEST WO CONTRAST ? ?Result Date: 03/30/2022 ?CLINICAL DATA:  Pneumonia.  Possible pleural effusion. EXAM: CT CHEST WITHOUT CONTRAST TECHNIQUE: Multidetector CT imaging of the chest was performed following the standard protocol without IV contrast. RADIATION DOSE REDUCTION: This exam was performed according to the departmental dose-optimization program which includes automated exposure control, adjustment of the mA and/or kV according to patient size and/or use of iterative reconstruction technique. COMPARISON:  03/19/2022. FINDINGS: Cardiovascular: Heart is mildly enlarged with a left atrial predominance. Small pericardial effusion. Three-vessel coronary artery calcifications. Great vessels are normal in caliber. Stable aortic atherosclerotic calcifications. Mediastinum/Nodes: Prominent to mildly enlarged mediastinal lymph nodes, largest a right subcarinal node measuring 1.8 cm in short axis, without change. No mediastinal or hilar masses. Trachea and esophagus are unremarkable. Lungs/Pleura: Moderate right and trace left pleural effusions. Lung consolidation and/or atelectasis, right lower lobe tracking along the segmental bronchi, and to a lesser degree in the left lower lobe and medial aspects of the left upper lobe lingula and right middle lobe, similar to the recent prior CT. Remainder of the lungs is clear. No evidence of pulmonary edema. No pneumothorax. Upper Abdomen: No acute abnormality. Musculoskeletal: No fracture or acute finding. No bone lesion. No chest wall mass. IMPRESSION: 1. No significant change from the recent prior chest CT. 2. Cardiomegaly, small pericardial effusion, moderate right and trace left pleural effusions. Dependent lung opacity, most prominently in the right lower  lobe, likely atelectasis. Pneumonia is possible. No evidence of pulmonary edema. Mildly enlarged right subcarinal lymph node, presumed reactive. 3. Coronary artery calcifications and aortic atherosclerosis. Aortic Atherosclerosis (ICD10-I70.0). Electronically Signed   By: Lajean Manes M.D.   On: 03/30/2022 10:19   ? ?Cardiac Studies  ?TTE 03/27/2022 ? 1. Left ventricular ejection fraction, by estimation, is 50 to 55%. The  ?left ventricle has low normal function. The left ventricle demonstrates  ?global hypokinesis. There is mild left ventricular hypertrophy. Left  ?  ventricular diastolic function could not  ? be evaluated. There is incoordinate septal motion.  ? 2. Right ventricular systolic function is normal. The right ventricular  ?size is normal. There is mildly elevated pulmonary artery systolic  ?pressure. The estimated right ventricular systolic pressure is 09.3 mmHg.  ? 3. Left atrial size was severely dilated.  ? 4. Right atrial size was moderately dilated.  ? 5. A small pericardial effusion is present. The pericardial effusion is  ?posterior to the left ventricle.  ? 6. The mitral valve is abnormal. Mild mitral valve regurgitation.  ? 7. The aortic valve is tricuspid. Aortic valve regurgitation is not  ?visualized. Aortic valve sclerosis is present, with no evidence of aortic  ?valve stenosis.  ? 8. The inferior vena cava is normal in size with greater than 50%  ?respiratory variability, suggesting right atrial pressure of 3 mmHg.  ? ?Patient Profile  ?Anne Roach is a 88 y.o. female with hypertension, diabetes, permanent atrial fibrillation, diastolic heart failure who was admitted on 03/27/2022 for acute on chronic diastolic heart failure. ? ?Assessment & Plan  ? ?#Acute on chronic diastolic heart failure, EF 50 to 55% ?-She has been transitioned to p.o. Lasix but yesterday appeared volume up and given IV Lasix 40 mg x 2.  She appears euvolemic on exam, but reports she has been having  dysphagia/vomiting when she takes p.o. Will continue IV Lasix today and recommend swallow study ?-CT chest with moderate right pleural effusion, would consider thoracentesis ?-Continue Aldactone 25 mg daily and Jardiance 10 m

## 2022-03-31 NOTE — Progress Notes (Incomplete)
Mobility Specialist Progress Note: ? ? 03/31/22 1755  ?Mobility  ?Activity Stood at bedside  ?Level of Assistance Standby assist, set-up cues, supervision of patient - no hands on  ?Assistive Device Front wheel walker  ?Activity Response Tolerated well  ?$Mobility charge 1 Mobility  ? ? ? ?Nelta Numbers ?Acute Rehab ?Secure Chat or ?Office Phone: [phone] ? ?

## 2022-03-31 NOTE — Progress Notes (Signed)
Physical Therapy Treatment ?Patient Details ?Name: Anne Roach ?MRN: [id number] ?DOB: [birth year] ?Today's Date: 03/31/2022 ? ? ?History of Present Illness The pt is an 88 yo female presenting 5/11 with increasing SOB. Pt with x3 admissions for same complaint since 5/1. Chest X-ray shows bilateral pleural effusions, pt with acute on chronic CHF exacerbation. PMH includes: afib, CHF, HTN, and DM II. ? ?  ?PT Comments  ? ? The pt was agreeable to session despite reports of increased pain in BLE this morning. The pt was able to progress ambulation distance, but despite repeated cues and prompts to self-monitor exertion and need for rest, pt not giving any advanced notice of fatigue or need for rest until unable to ambulate any further (70 ft). The pt then required seated rest for 1 min prior to returning to room. Pt reports more limited by pain and fatigue at this time, SpO2 remained stable >92% on RA with gait. The pt will continue to benefit from skilled PT to progress endurance and activity tolerance, will continue to benefit from encouragement to better self-monitor exertion and fatigue.  ?  ?Recommendations for follow up therapy are one component of a multi-disciplinary discharge planning process, led by the attending physician.  Recommendations may be updated based on patient status, additional functional criteria and insurance authorization. ? ?Follow Up Recommendations ? Home health PT (would benefit from increased aide hours in short term or information on PACE program) ?  ?  ?Assistance Recommended at Discharge Frequent or constant Supervision/Assistance  ?Patient can return home with the following A little help with walking and/or transfers;A little help with bathing/dressing/bathroom;Assistance with cooking/housework;Direct supervision/assist for medications management;Direct supervision/assist for financial management;Assist for transportation;Help with stairs or ramp for entrance ?  ?Equipment  Recommendations ? None recommended by PT  ?  ?Recommendations for Other Services   ? ? ?  ?Precautions / Restrictions Precautions ?Precautions: Fall ?Precaution Comments: Watch O2 sats and HR ?Restrictions ?Weight Bearing Restrictions: No  ?  ? ?Mobility ? Bed Mobility ?Overal bed mobility: Needs Assistance ?Bed Mobility: Sit to Supine, Supine to Sit ?  ?  ?Supine to sit: Supervision ?Sit to supine: Supervision ?  ?General bed mobility comments: supervision, assist to manage placement of blankets only ?  ? ?Transfers ?Overall transfer level: Needs assistance ?Equipment used: Rolling walker (2 wheels) ?Transfers: Sit to/from Stand ?Sit to Stand: Min guard ?  ?  ?  ?  ?  ?General transfer comment: minG, cues for placement of hands to armrests rather than RW ?  ? ?Ambulation/Gait ?Ambulation/Gait assistance: Min guard, Min assist ?Gait Distance (Feet): 70 Feet (+ 25 ft after seated rest + 45 ft after standing rest) ?Assistive device: Rolling walker (2 wheels) ?Gait Pattern/deviations: Step-through pattern, Decreased stride length, Staggering right, Trunk flexed ?Gait velocity: decreased ?  ?  ?General Gait Details: trial of RW this session with pt needing max cues for positioning in the RW and posture with gait. despite cues and prompts for monitoring exertion, pt not responding until stating she is unable to walk any further (after 70 ft) and required seated rest. then able to ambulate 25 ft + 45 ft with standing rest break to return to room. SpO2 stable on RA with gait, pt reports limited by pain in knees and back ? ? ? ?  ?Balance Overall balance assessment: Needs assistance ?Sitting-balance support: Feet supported ?Sitting balance-Leahy Scale: Good ?  ?  ?Standing balance support: Single extremity supported ?Standing balance-Leahy Scale: Fair ?Standing balance comment: BUE  support for gait, static stance wtihout UE support ?  ?  ?  ?  ?  ?  ?  ?  ?  ?  ?  ?  ? ?  ?Cognition Arousal/Alertness: Awake/alert ?Behavior  During Therapy: [facility] for tasks assessed/performed ?Overall Cognitive Status: Within Functional Limits for tasks assessed ?  ?  ?  ?  ?  ?  ?  ?  ?  ?  ?  ?  ?  ?  ?  ?  ?General Comments: pt at times with poor response to questions and prompts to self-monitor. pt needing max cues to monitor exertion, and at times gives no advanced warning of fatigue or needing to stop and rest. ?  ?  ? ?  ?Exercises   ? ?  ?General Comments General comments (skin integrity, edema, etc.): SpO2 stable on RA 93-96% with gait ?  ?  ? ?Pertinent Vitals/Pain Pain Assessment ?Pain Assessment: Faces ?Faces Pain Scale: Hurts whole lot ?Pain Location: bilateral knees and low back, pt reports this is chronic and typically manages with soaking her legs in warm water with salt ?Pain Descriptors / Indicators: Discomfort, Sore ?Pain Intervention(s): Limited activity within patient's tolerance, Monitored during session, Repositioned, Heat applied, Premedicated before session  ? ? ? ?PT Goals (current goals can now be found in the care plan section) Acute Rehab PT Goals ?Patient Stated Goal: maintain independence ?PT Goal Formulation: With patient ?Time For Goal Achievement: 04/11/22 ?Potential to Achieve Goals: Good ?Progress towards PT goals: Progressing toward goals ? ?  ?Frequency ? ? ? Min 3X/week ? ? ? ?  ?PT Plan Current plan remains appropriate  ? ? ?   ?AM-PAC PT "6 Clicks" Mobility   ?Outcome Measure ? Help needed turning from your back to your side while in a flat bed without using bedrails?: None ?Help needed moving from lying on your back to sitting on the side of a flat bed without using bedrails?: None ?Help needed moving to and from a bed to a chair (including a wheelchair)?: A Little ?Help needed standing up from a chair using your arms (e.g., wheelchair or bedside chair)?: A Little ?Help needed to walk in hospital room?: A Little ?Help needed climbing 3-5 steps with a railing? : A Little ?6 Click Score: 20 ? ?  ?End of Session  Equipment Utilized During Treatment: Gait belt ?Activity Tolerance: Patient tolerated treatment well ?Patient left: with call bell/phone within reach;in bed ?Nurse Communication: Mobility status ?PT Visit Diagnosis: Other abnormalities of gait and mobility (R26.89);Muscle weakness (generalized) (M62.81) ?  ? ? ?Time: 0355-9741 ?PT Time Calculation (min) (ACUTE ONLY): 19 min ? ?Charges:  $Therapeutic Exercise: 8-22 mins          ?          ? ?West Carbo, PT, DPT  ? ?Acute Rehabilitation Department ?Pager #: [phone] ? ? ?Sandra Cockayne ?03/31/2022, 1:25 PM ? ?

## 2022-03-31 NOTE — Progress Notes (Signed)
Patient has questions about taking Bumex instead of IV lasix per family recommendation. Patient refused thoracentesis. Gardiner Rhyme MD and Broadus John MD. Gardiner Rhyme MD of cardiology to reround on patient. ?

## 2022-03-31 NOTE — Progress Notes (Signed)
?PROGRESS NOTE ? ? ? ?Anne Roach  QQP:[id number] DOB: 1932-12-12 DOA: 03/27/2022 ?PCP: Lin Landsman, MD  ? ? Anne Roach is a 88 y.o. female with medical history significant of PAF on Eliquis; chronic diastolic CHF; DM; and HTN presenting with SOB. Symptoms started on 5/1.  Had multiple ED trips in the last week for dyspnea, often self-limiting. ?-History of dyspnea on exertion, chronic lower extremity edema, in the ED BNP was 449, troponin was negative, chest x-ray noted bilateral pleural effusions, noted to be in A-fib with heart rate in the 120, 130 range ? ?Subjective: ?-Feels dizzy this morning breathing has improved ? ?Assessment and Plan: ? ?Acute on chronic diastolic CHF ?Bilateral pleural effusions ?-Third ED visit since 5/11 for dyspnea  ?-Chest x-ray with bilateral pleural effusion and pulmonary vascular congestion  ?-Echo with EF of 50%, mild LVH, indeterminate diastolic parameters, mild PAH ?-Diuresed with IV Lasix, she is 6.4 L negative, weight down 7lbs ?-Clinically appears slightly volume overloaded, discussed with cards will redose IV Lasix today, CT chest with moderate right effusion, will order thoracentesis ?-Continue Aldactone and Farxiga ?-BMP in a.m., increase activity as tolerated ?-SLP eval to rule out aspiration ?  ?Afib ?-Rate relatively controlled, cards following switched from diltiazem to metoprolol, EF felt to be slightly lower ?-Continue Eliquis ? ?Chronic knee pain ?-Suspect chronic osteoarthritis, add Voltaren gel, takes Tylenol and tramadol at home ? ?Low back pain ?-Continue lidocaine patch ?  ?DM ?-CBGs are stable, Glucophage on hold ?-Continue moderate-scale SSI  ? ?Acute on chronic anemia ?-No overt bleeding, stable, monitor ?  ?Chronic pain ?-Continue Ultram, Neurontin ?  ?Stage 3a CKD ?-Appears relatively stable ? ?DVT prophylaxis: Eliquis ?CODE STATUS: Full code ?Family communication: Discussed with patient detail, no family at bedside ?Disposition, home likely 1 to  2 days ?  ? ?Procedures:  ? ?Antimicrobials:  ? ? ?Objective: ?Vitals:  ? 03/31/22 0203 03/31/22 0248 03/31/22 0851 03/31/22 1112  ?BP:  (!) 94/51 (!) 114/57 105/79  ?Pulse:  80 99 99  ?Resp:  18    ?Temp:  98.9 ?F (37.2 ?C)    ?TempSrc:  Oral    ?SpO2:  92%    ?Weight: 78.6 kg     ?Height:      ? ? ?Intake/Output Summary (Last 24 hours) at 03/31/2022 1322 ?Last data filed at 03/31/2022 0845 ?Gross per 24 hour  ?Intake 457 ml  ?Output 1600 ml  ?Net -1143 ml  ? ?Filed Weights  ? 03/29/22 0415 03/30/22 0610 03/31/22 0203  ?Weight: 80.3 kg 79.5 kg 78.6 kg  ? ? ?Examination: ? ?General exam: Obese elderly female sitting up in bed, AAO x3, slightly uncomfortable appearing ?JVD: Positive JVD ?CVS: S1-S2, irregularly irregular rhythm ?Lungs: Decreased breath sounds bases ?Abdomen: Soft, nontender, bowel sounds present ?Extremities: Trace edema  ?Skin: No rashes ?Psychiatry:  Mood & affect appropriate.  ? ? ? ?Data Reviewed:  ? ?CBC: ?Recent Labs  ?Lab 03/27/22 ?7124 03/28/22 ?0401 03/29/22 ?0427 03/30/22 ?5809 03/31/22 ?9833  ?WBC 7.9 6.9 8.3 7.7 7.4  ?NEUTROABS 5.3  --   --   --   --   ?HGB 9.3* 8.8* 10.0* 9.3* 9.3*  ?HCT 31.6* 29.5* 32.5* 30.4* 30.5*  ?MCV 91.1 90.8 89.0 90.5 88.4  ?PLT 267 233 251 224 233  ? ?Basic Metabolic Panel: ?Recent Labs  ?Lab 03/27/22 ?8250 03/27/22 ?1400 03/28/22 ?0401 03/29/22 ?0427 03/30/22 ?5397 03/31/22 ?6734  ?NA 137  --  137 138 138 138  ?K 4.7  --  3.7 4.2 4.5 3.6  ?CL 103  --  102 101 100 97*  ?CO2 25  --  '24 29 29 31  '$ ?GLUCOSE 170*  --  100* 105* 106* 104*  ?BUN 12  --  '13 15 19 22  '$ ?CREATININE 0.98  --  1.11* 1.22* 1.32* 1.28*  ?CALCIUM 8.6*  --  8.3* 8.8* 8.7* 8.8*  ?MG  --  2.4  --   --   --   --   ? ?GFR: ?Estimated Creatinine Clearance: 32.8 mL/min (A) (by C-G formula based on SCr of 1.28 mg/dL (H)). ?Liver Function Tests: ?No results for input(s): AST, ALT, ALKPHOS, BILITOT, PROT, ALBUMIN in the last 168 hours. ?No results for input(s): LIPASE, AMYLASE in the last 168 hours. ?No  results for input(s): AMMONIA in the last 168 hours. ?Coagulation Profile: ?No results for input(s): INR, PROTIME in the last 168 hours. ?Cardiac Enzymes: ?No results for input(s): CKTOTAL, CKMB, CKMBINDEX, TROPONINI in the last 168 hours. ?BNP (last 3 results) ?No results for input(s): PROBNP in the last 8760 hours. ?HbA1C: ?No results for input(s): HGBA1C in the last 72 hours. ? ?CBG: ?Recent Labs  ?Lab 03/30/22 ?1043 03/30/22 ?1619 03/30/22 ?2118 03/31/22 ?0093 03/31/22 ?1115  ?GLUCAP 125* 149* 145* 113* 134*  ? ?Lipid Profile: ?No results for input(s): CHOL, HDL, LDLCALC, TRIG, CHOLHDL, LDLDIRECT in the last 72 hours. ?Thyroid Function Tests: ?No results for input(s): TSH, T4TOTAL, FREET4, T3FREE, THYROIDAB in the last 72 hours. ?Anemia Panel: ?No results for input(s): VITAMINB12, FOLATE, FERRITIN, TIBC, IRON, RETICCTPCT in the last 72 hours. ? ?Urine analysis: ?   ?Component Value Date/Time  ? COLORURINE YELLOW 01/18/2021 1059  ? APPEARANCEUR CLEAR 01/18/2021 1059  ? LABSPEC 1.013 01/18/2021 1059  ? PHURINE 6.0 01/18/2021 1059  ? GLUCOSEU NEGATIVE 01/18/2021 1059  ? HGBUR NEGATIVE 01/18/2021 1059  ? [place] NEGATIVE 01/18/2021 1059  ? [place] NEGATIVE 01/18/2021 1059  ? PROTEINUR NEGATIVE 01/18/2021 1059  ? UROBILINOGEN 0.2 06/12/2011 1201  ? NITRITE NEGATIVE 01/18/2021 1059  ? LEUKOCYTESUR NEGATIVE 01/18/2021 1059  ? ?Sepsis Labs: ?'@LABRCNTIP'$ (procalcitonin:4,lacticidven:4) ? ?)No results found for this or any previous visit (from the past 240 hour(s)).  ? ?Radiology Studies: ?CT CHEST WO CONTRAST ? ?Result Date: 03/30/2022 ?CLINICAL DATA:  Pneumonia.  Possible pleural effusion. EXAM: CT CHEST WITHOUT CONTRAST TECHNIQUE: Multidetector CT imaging of the chest was performed following the standard protocol without IV contrast. RADIATION DOSE REDUCTION: This exam was performed according to the departmental dose-optimization program which includes automated exposure control, adjustment of the mA and/or kV  according to patient size and/or use of iterative reconstruction technique. COMPARISON:  03/19/2022. FINDINGS: Cardiovascular: Heart is mildly enlarged with a left atrial predominance. Small pericardial effusion. Three-vessel coronary artery calcifications. Great vessels are normal in caliber. Stable aortic atherosclerotic calcifications. Mediastinum/Nodes: Prominent to mildly enlarged mediastinal lymph nodes, largest a right subcarinal node measuring 1.8 cm in short axis, without change. No mediastinal or hilar masses. Trachea and esophagus are unremarkable. Lungs/Pleura: Moderate right and trace left pleural effusions. Lung consolidation and/or atelectasis, right lower lobe tracking along the segmental bronchi, and to a lesser degree in the left lower lobe and medial aspects of the left upper lobe lingula and right middle lobe, similar to the recent prior CT. Remainder of the lungs is clear. No evidence of pulmonary edema. No pneumothorax. Upper Abdomen: No acute abnormality. Musculoskeletal: No fracture or acute finding. No bone lesion. No chest wall mass. IMPRESSION: 1. No significant change from the recent prior chest CT.  2. Cardiomegaly, small pericardial effusion, moderate right and trace left pleural effusions. Dependent lung opacity, most prominently in the right lower lobe, likely atelectasis. Pneumonia is possible. No evidence of pulmonary edema. Mildly enlarged right subcarinal lymph node, presumed reactive. 3. Coronary artery calcifications and aortic atherosclerosis. Aortic Atherosclerosis (ICD10-I70.0). Electronically Signed   By: Lajean Manes M.D.   On: 03/30/2022 10:19  ? ?DG CHEST PORT 1 VIEW ? ?Result Date: 03/31/2022 ?CLINICAL DATA:  88 year old female with pleural effusion EXAM: PORTABLE CHEST 1 VIEW COMPARISON:  03/29/2022 FINDINGS: Cardiomediastinal silhouette unchanged with cardiomegaly. Opacities at the bilateral lung bases persists with obscuration of the bilateral hemidiaphragm and the  heart borders. No pneumothorax. No interlobular septal thickening. IMPRESSION: Unchanged appearance of the chest x-ray, with opacities at the bilateral lung bases likely a combination of pleural effusion and a

## 2022-03-31 NOTE — Progress Notes (Addendum)
Heart Failure Stewardship Pharmacist Progress Note ? ? ?PCP: Lin Landsman, MD ?PCP-Cardiologist: Jenkins Rouge, MD  ? ? ?HPI:  ?88 yo female with PMH of T2DM, HTN, OSA, permanent Afib (PTA Eliquis), chronic diastolic CHF, and gout. Presented to ED with worsening dyspnea arrived on 2L [place] with O2 sat 89% and in Afib. Patient endorsed orthopnea and denied weight gain or LEE outside of her baseline swelling. Echo 05/11 with LVEF 50-55%, mild LVH, normal RV, mildly elevated pulmonary pressures, biatrial dilation, and mild MVR. Compliant to PTA diuretic regimen. Notable PTA medications include diltiazem 240 mg daily + PRN (required dose this past week). Discharge delayed with need for ongoing diuresis and swallow study. ? ?Current HF Medications: ?Diuretic: furosemide 20 mg IV BID ?Beta Blocker: metoprolol succinate 50 mg daily ?Aldosterone Antagonist: spironolactone 25 mg daily ?SGLT2i: Jardiance 10 mg daily ? ?Prior to admission HF Medications: ?Diuretic: furosemide 40 mg daily ?Other: potassium 20 mEq daily ? ?Pertinent Lab Values: ?Serum creatinine 1.28, BUN 22, Potassium 3.6, Sodium 138, BNP 449.7, A1c 6.1%  ? ?Vital Signs: ?Weight: 173 lbs (admission weight: 180.5 lbs) ?Blood pressure: 100-110/70s  ?Heart rate: 80-90s  ?I/O: -3L yesterday; -700 cc so far today; net -6.3L ? ?Medication Assistance / Insurance Benefits Check: ?Does the patient have prescription insurance?  Yes ?Type of insurance plan: Medicare/Medicaid ? ?Outpatient Pharmacy:  ?Prior to admission outpatient pharmacy: [facility] ?Is the patient willing to use [facility] pharmacy at discharge? Pending ?Is the patient willing to transition their outpatient pharmacy to utilize a [facility] outpatient pharmacy?   Pending ?  ? ?Assessment: ?1. Acute on chronic diastolic CHF (LVEF 06-26%). NYHA class III symptoms. ?Patient improving with diuresis, now on RA and endorses improved breathing. ?- Continue furosemide IV 20 mg BID - good urine output, continuing IV  while swallow study underway. K 3.6 - 40 mEq ordered for supplementation. Last magnesium from 5/11 was 2.4.  ?- Continue metoprolol succinate 50 mg daily ?- Dizziness limiting use of ARNI ?- Continue spironolactone 25 mg daily ?- Continue Jardiance 10 mg daily ?  ?Plan: ?1) Medication changes recommended at this time: ?- Check magnesium with AM labs ?- Per previous discussion with Dr. Harrell Gave, VT on EKG likely an artifact. Will continue to monitor for NSVT with option to metoprolol tartrate 25 mg BID if any additional episodes ? ?2) Patient assistance: ?- pending patient discussion ? ?3)  Education  ?- To be completed prior to discharge ?- Ensure PTA diltiazem discontinued at discharge ? ?Kerby Nora, PharmD, BCPS ?Heart Failure Stewardship Pharmacist ?Phone [phone] ? ? ? ?

## 2022-03-31 NOTE — Progress Notes (Signed)
Mobility Specialist Progress Note: ? ? 03/31/22 1755  ?Orthostatic Lying   ?BP- Lying 131/80  ?Orthostatic Sitting  ?BP- Sitting 118/66  ?Orthostatic Standing at 0 minutes  ?BP- Standing at 0 minutes 108/49  ?Orthostatic Standing at 3 minutes  ?BP- Standing at 3 minutes 108/69  ?Mobility  ?Activity Stood at bedside  ?Level of Assistance Standby assist, set-up cues, supervision of patient - no hands on  ?Assistive Device Front wheel walker  ?Activity Response Tolerated well  ?$Mobility charge 1 Mobility  ? ?RN requesting to check orthostatic Bps. Pt asx throughout. ? ?Nelta Numbers ?Acute Rehab ?Secure Chat or ?Office Phone: [phone] ? ?

## 2022-03-31 NOTE — TOC Progression Note (Addendum)
Transition of Care (TOC) - Progression Note  ? ? ?Patient Details  ?Name: Anne Roach ?MRN: [id number] ?Date of Birth: 12-07-1932 ? ?Transition of Care (TOC) CM/SW Contact  ?Zenon Mayo, RN ?Phone Number: ?03/31/2022, 11:48 AM ? ?Clinical Narrative:    ?Patient received IV lasix yesterday, bp was soft yesterday as well, but today it is around 114, will monitor to see if will need IV lasix again today. Patient is active with [facility]. For HHPT.  TOC will continue to follow.  ? ? ?Expected Discharge Plan: [place] ?Barriers to Discharge: Continued Medical Work up ? ?Expected Discharge Plan and Services ?Expected Discharge Plan: [place] ?  ?  ?Post Acute Care Choice: Home Health ?  ?                ?  ?  ?  ?  ?  ?  ?  ?  ?  ?  ? ? ?Social Determinants of Health (SDOH) Interventions ?Food Insecurity Interventions: Intervention Not Indicated ?Financial Strain Interventions: Other (Comment), Intervention Not Indicated ?Housing Interventions: Intervention Not Indicated ?Transportation Interventions: Intervention Not Indicated ? ?Readmission Risk Interventions ?   ? View : No data to display.  ?  ?  ?  ? ? ?

## 2022-03-31 NOTE — Progress Notes (Signed)
Occupational Therapy Treatment ?Patient Details ?Name: Anne Roach ?MRN: [id number] ?DOB: February 23, 1933 ?Today's Date: 03/31/2022 ? ? ?History of present illness The pt is an 88 yo female presenting 5/11 with increasing SOB. Pt with x3 admissions for same complaint since 5/1. Chest X-ray shows bilateral pleural effusions, pt with acute on chronic CHF exacerbation. PMH includes: afib, CHF, HTN, and DM II. ?  ?OT comments ? Pt participated in ADL retraining session today to include functional moblity in room with RW, grooming, bathing and dressing at sink level, sit to stand x3. Pt is impacted by fatigue and reports of bilateral LE leg pain. RN was aware and gave pain meds. O2 during session was 90-95% throughout on RA. Her HR ranged from 100-125.  Refer to chart for BP in sitting. Continue toward [facility] and acute OT goals to assist in maximizing independence with ADL's and functional mobility for safe return home with PRN assist.  ? ?Recommendations for follow up therapy are one component of a multi-disciplinary discharge planning process, led by the attending physician.  Recommendations may be updated based on patient status, additional functional criteria and insurance authorization. ?   ?Follow Up Recommendations ? No OT follow up  ?  ?Assistance Recommended at Discharge Intermittent Supervision/Assistance  ?Patient can return home with the following ? Assist for transportation;Assistance with cooking/housework;Direct supervision/assist for medications management ?  ?Equipment Recommendations ? None recommended by OT (Pt has DME per her report)  ?  ?Recommendations for Other Services   ? ?  ?Precautions / Restrictions Precautions ?Precautions: Fall ?Precaution Comments: Watch O2 sats and HR  ? ? ?  ? ?Mobility Bed Mobility ?  ?  ?General bed mobility comments: Pt received up in chair upon OT arrival ?  ? ?Transfers ?Overall transfer level: Needs assistance ?Equipment used: Rolling walker (2 wheels) ?Transfers: Sit  to/from Stand ?Sit to Stand: Min guard ?  ?Step pivot transfers: Min guard ?  ?  ?General transfer comment: min guard for safety, min A to steady on single occasion during this session ?  ?  ?Balance Overall balance assessment: Needs assistance ?Sitting-balance support: Feet supported, No upper extremity supported ?Sitting balance-Leahy Scale: Good ?  ?  ?Standing balance support: Single extremity supported, Bilateral upper extremity supported, No upper extremity supported ?Standing balance-Leahy Scale: Fair ?Standing balance comment: x1 instance of needing minA to steady, no overt LOB. Pt used sink to steady ?   ? ?ADL either performed or assessed with clinical judgement  ? ?ADL Overall ADL's : Needs assistance/impaired ?Eating/Feeding: Independent;Sitting ?  ?Grooming: Wash/dry hands;Supervision/safety;Wash/dry face;Oral care;Brushing hair;Sitting;Standing ?Grooming Details (indicate cue type and reason): Pt sitting and standing at sink for rest breaks x3 ?Upper Body Bathing: Set up;Sitting ?Upper Body Bathing Details (indicate cue type and reason): Sitting at sink ?Lower Body Bathing: Supervison/ safety;Sit to/from stand;Sitting/lateral leans;Set up ?Lower Body Bathing Details (indicate cue type and reason): Pt was close supervision for LB bathing and peri care sit<>stand at sink ?Upper Body Dressing : Set up;Sitting ?Upper Body Dressing Details (indicate cue type and reason): Pt don/doffed gown sitting at sink after bathing ?Lower Body Dressing: Min guard;Sit to/from stand;Sitting/lateral leans ?Lower Body Dressing Details (indicate cue type and reason): No hands on assist but close supervision to min guard secondary to pt reporting feeling weak during ADL's at sink level today. Pt donned/doffed underwear w/o hands on assist in sitting ?Toilet Transfer: Supervision/safety;Min guard;Ambulation;BSC/3in1;Rolling walker (2 wheels) ?Toilet Transfer Details (indicate cue type and reason): Simulated ambulating to sink  and  sitting in chair for grooming, bathing and dressing and sit to stand x3 using RW ?Toileting- Clothing Manipulation and Hygiene: Sit to/from stand;Sitting/lateral lean;Min guard;Supervision/safety ?Toileting - Clothing Manipulation Details (indicate cue type and reason): Close supervision to min guard ?  ?  ?Functional mobility during ADLs: Min guard;Rolling walker (2 wheels) ?General ADL Comments: Pt participated in ADL retrainign session today to include functional moblity in room with RW, grooming, bathing and dressing at sink level, sit to stand x3. Pt is impacted by fatigue and reports of bilateral LE leg pain. RN was aware and gave pain meds. O2 during session was 90-95% throughout on RA. Her HR ranged from 100-125.  Refer to chart for BP in sitting. ?  ? ?Extremity/Trunk Assessment Upper Extremity Assessment ?Upper Extremity Assessment: Overall WFL for tasks assessed;Generalized weakness ?  ?Lower Extremity Assessment ?Lower Extremity Assessment: Defer to PT evaluation ?  ?Cervical / Trunk Assessment ?Cervical / Trunk Assessment: Kyphotic ?  ? ?Vision Baseline Vision/History: 1 Wears glasses ?Patient Visual Report: No change from baseline ?  ?  ?   ?   ? ?Cognition Arousal/Alertness: Awake/alert ?Behavior During Therapy: [facility] for tasks assessed/performed ?Overall Cognitive Status: Within Functional Limits for tasks assessed ?  ?  ?General Comments: pt following all cues and instructions ?  ?  ?   ?   ?   ?General Comments SpO2 90-95% on RA  ? ? ?Pertinent Vitals/ Pain       Pain Assessment ?Pain Assessment: Faces ?Faces Pain Scale: Hurts little more ?Pain Location: Legs ?Pain Descriptors / Indicators: Discomfort, Grimacing ?Pain Intervention(s): Limited activity within patient's tolerance, Monitored during session, Repositioned, Patient requesting pain meds-RN notified ? ?Home Living  Lives with daughter whom works 2 job, pt is home alone during the day with HHA for 2 hours. Refer to chart/initial eval for  details ? ?  ?Prior Functioning/Environment   Refer to initial Eval/Chart for details  ? ?Frequency ? Min 2X/week  ? ? ? ? ?  ?Progress Toward Goals ? ?OT Goals(current goals can now be found in the care plan section) ? Progress towards OT goals: Progressing toward goals ? ?Acute Rehab OT Goals ?Patient Stated Goal: Decreased leg pain and overall weakness ?OT Goal Formulation: With patient ?Time For Goal Achievement: 04/10/22 ?Potential to Achieve Goals: Good  ?Plan Discharge plan remains appropriate   ? ?   ?AM-PAC OT "6 Clicks" Daily Activity     ?Outcome Measure ? ? Help from another person eating meals?: None ?Help from another person taking care of personal grooming?: A Little ?Help from another person toileting, which includes using toliet, bedpan, or urinal?: A Little ?Help from another person bathing (including washing, rinsing, drying)?: A Little ?Help from another person to put on and taking off regular upper body clothing?: None (Sitting) ?Help from another person to put on and taking off regular lower body clothing?: A Little ?6 Click Score: 20 ? ?  ?End of Session Equipment Utilized During Treatment: Gait belt;Rolling walker (2 wheels) ? ?OT Visit Diagnosis: Unsteadiness on feet (R26.81) ?  ?Activity Tolerance Patient limited by fatigue ?  ?Patient Left in chair;with call bell/phone within reach;with nursing/sitter in room ?  ?Nurse Communication Mobility status;Patient requests pain meds;Other (comment) (HR) ?  ? ?   ? ?Time: 3151-7616 ?OT Time Calculation (min): 40 min ? ?Charges: OT General Charges ?$OT Visit: 1 Visit ?OT Treatments ?$Self Care/Home Management : 38-52 mins ? ?Carlynn Herald Haliegh Khurana Beth Dixon, OTR/L ?03/31/2022, 9:10 AM ? ? ?

## 2022-04-01 ENCOUNTER — Other Ambulatory Visit (HOSPITAL_COMMUNITY): Payer: Medicare HMO

## 2022-04-01 ENCOUNTER — Inpatient Hospital Stay (HOSPITAL_COMMUNITY): Payer: Medicare HMO

## 2022-04-01 DIAGNOSIS — I4821 Permanent atrial fibrillation: Secondary | ICD-10-CM | POA: Diagnosis not present

## 2022-04-01 DIAGNOSIS — J90 Pleural effusion, not elsewhere classified: Secondary | ICD-10-CM | POA: Diagnosis not present

## 2022-04-01 DIAGNOSIS — I5033 Acute on chronic diastolic (congestive) heart failure: Secondary | ICD-10-CM | POA: Diagnosis not present

## 2022-04-01 LAB — GLUCOSE, CAPILLARY
Glucose-Capillary: 120 mg/dL — ABNORMAL HIGH (ref 70–99)
Glucose-Capillary: 120 mg/dL — ABNORMAL HIGH (ref 70–99)
Glucose-Capillary: 123 mg/dL — ABNORMAL HIGH (ref 70–99)
Glucose-Capillary: 122 mg/dL — ABNORMAL HIGH (ref 70–99)

## 2022-04-01 LAB — BASIC METABOLIC PANEL
Anion gap: 12 (ref 5–15)
BUN: 28 mg/dL — ABNORMAL HIGH (ref 8–23)
CO2: 25 mmol/L (ref 22–32)
Calcium: 8.9 mg/dL (ref 8.9–10.3)
Chloride: 101 mmol/L (ref 98–111)
Creatinine, Ser: 1.33 mg/dL — ABNORMAL HIGH (ref 0.44–1.00)
GFR, Estimated: 39 mL/min — ABNORMAL LOW (ref >60)
Glucose, Bld: 111 mg/dL — ABNORMAL HIGH (ref 70–99)
Potassium: 5.3 mmol/L — ABNORMAL HIGH (ref 3.5–5.1)
Sodium: 138 mmol/L (ref 135–145)

## 2022-04-01 LAB — MAGNESIUM: Magnesium: 2.6 mg/dL — ABNORMAL HIGH (ref 1.7–2.4)

## 2022-04-01 MED ORDER — DICLOFENAC SODIUM 1 % EX GEL
2.0000 g | Freq: Two times a day (BID) | CUTANEOUS | Status: DC
  Filled 2022-04-01: qty 100

## 2022-04-01 MED ORDER — SODIUM ZIRCONIUM CYCLOSILICATE 5 G PO PACK
5.0000 g | PACK | Freq: Once | ORAL | Status: DC
  Filled 2022-04-01: qty 1

## 2022-04-01 MED ORDER — LIDOCAINE HCL 1 % IJ SOLN
INTRAMUSCULAR | Status: AC
  Filled 2022-04-01: qty 20

## 2022-04-01 NOTE — Plan of Care (Signed)
?  Problem: Activity: ?Goal: Capacity to carry out activities will improve ?Outcome: Progressing ?  ?Problem: Elimination: ?Goal: Will not experience complications related to bowel motility ?Outcome: Progressing ?  ?

## 2022-04-01 NOTE — TOC Progression Note (Addendum)
Transition of Care (TOC) - Progression Note  ? ? ?Patient Details  ?Name: Anne Roach ?MRN: [id number] ?Date of Birth: Dec 23, 1932 ? ?Transition of Care (TOC) CM/SW Contact  ?Zenon Mayo, RN ?Phone Number: ?04/01/2022, 1:27 PM ? ?Clinical Narrative:    ?NCM spoke with daughter at the bedside, she speaks [place], patient lives with her.  Daughter will transport her home at discharge.  She is active with [facility] for HHPT, they would like to continue .  Daughter states she also has an Engineer, production with [facility] she comes on Mon , Tues, Thur and Friday and sometimes Saturdays for 2.5 hrs.  Daughter states she works on Friday, Sat, Sun  and Monday so she is looking to increase the hours for the aide, Medicaid will have someone to come to do an assessment next week to see if the hrs can be increased.  TOC will continue to follow for dc needs. ? ? ?Expected Discharge Plan: [place] ?Barriers to Discharge: Continued Medical Work up ? ?Expected Discharge Plan and Services ?Expected Discharge Plan: [place] ?In-house Referral: NA ?Discharge Planning Services: CM Consult ?Post Acute Care Choice: Home Health ?Living arrangements for the past 2 months: [place] ?                ?  ?DME Agency: NA ?  ?  ?  ?HH Arranged: PT ?[place] Agency: [place] ?Date HH Agency Contacted: 04/01/22 ?Time [place]: 3149 ?Representative spoke with at [place]: Tommi Rumps ? ? ?Social Determinants of Health (SDOH) Interventions ?Food Insecurity Interventions: Intervention Not Indicated ?Financial Strain Interventions: Other (Comment), Intervention Not Indicated ?Housing Interventions: Intervention Not Indicated ?Transportation Interventions: Intervention Not Indicated ? ?Readmission Risk Interventions ?   ? View : No data to display.  ?  ?  ?  ? ? ?

## 2022-04-01 NOTE — Progress Notes (Signed)
Pt. Having leg pain. PRN meds ineffective. On call for [facility] paged to make aware.  ?

## 2022-04-01 NOTE — Progress Notes (Addendum)
?Cardiology Progress Note  ?Patient ID: Anne Roach ?MRN: [id number] ?DOB: [birth year] ?Date of Encounter: 04/01/2022 ? ?Primary Cardiologist: Jenkins Rouge, MD ? ?Subjective  ? ?Net -1.1 L yesterday, -7.5 L on admission.  Having lightheadedness with standing yesterday.  Orthostatics showed drop in BP from 131/80 lying to 108/69 standing.  IV Lasix was discontinued.  BP 129/76 this morning.  SPO2 92% on room air.  Creatinine stable at 1.33.  Potassium 5.3 this morning.  She reports she continues to feel short of breath ? ? ?Inpatient Medications  ?Scheduled Meds: ? allopurinol  100 mg Oral Daily  ? apixaban  5 mg Oral BID  ? diclofenac Sodium  2 g Topical BID  ? docusate sodium  100 mg Oral BID  ? empagliflozin  10 mg Oral Daily  ? feeding supplement  237 mL Oral Q1500  ? folic acid  4,270 mcg Oral Daily  ? gabapentin  300 mg Oral TID  ? insulin aspart  0-15 Units Subcutaneous TID WC  ? insulin aspart  0-5 Units Subcutaneous QHS  ? lidocaine  1 patch Transdermal Q24H  ? metoprolol succinate  50 mg Oral Daily  ? nystatin   Topical BID  ? sodium chloride flush  3 mL Intravenous Q12H  ? ?Continuous Infusions: ? ?PRN Meds: ?acetaminophen **OR** acetaminophen, alum & mag hydroxide-simeth, bisacodyl, hydrALAZINE, ondansetron **OR** ondansetron (ZOFRAN) IV, oxyCODONE, polyethylene glycol, traMADol, traZODone  ? ?Vital Signs  ? ?Vitals:  ? 03/31/22 1112 03/31/22 1532 03/31/22 2022 04/01/22 0337  ?BP: 105/79 112/64 110/83 129/76  ?Pulse: 99  95 85  ?Resp:   18 18  ?Temp:   98.7 ?F (37.1 ?C) 98.8 ?F (37.1 ?C)  ?TempSrc:   Oral Oral  ?SpO2:  94% 95% 92%  ?Weight:    77.6 kg  ?Height:      ? ? ?Intake/Output Summary (Last 24 hours) at 04/01/2022 0852 ?Last data filed at 04/01/2022 0755 ?Gross per 24 hour  ?Intake 1255 ml  ?Output 2950 ml  ?Net -1695 ml  ? ? ? ?  04/01/2022  ?  3:37 AM 03/31/2022  ?  2:03 AM 03/30/2022  ?  6:10 AM  ?Last 3 Weights  ?Weight (lbs) 171 lb 1.3 oz 173 lb 4.8 oz 175 lb 4.8 oz  ?Weight (kg) 77.601 kg  78.608 kg 79.516 kg  ?   ? ?Telemetry  ?Overnight telemetry shows Afib 80s to 100s bpm, which I personally reviewed.  ? ? ?Physical Exam  ? ?Vitals:  ? 03/31/22 1112 03/31/22 1532 03/31/22 2022 04/01/22 0337  ?BP: 105/79 112/64 110/83 129/76  ?Pulse: 99  95 85  ?Resp:   18 18  ?Temp:   98.7 ?F (37.1 ?C) 98.8 ?F (37.1 ?C)  ?TempSrc:   Oral Oral  ?SpO2:  94% 95% 92%  ?Weight:    77.6 kg  ?Height:      ?  ?Intake/Output Summary (Last 24 hours) at 04/01/2022 0852 ?Last data filed at 04/01/2022 0755 ?Gross per 24 hour  ?Intake 1255 ml  ?Output 2950 ml  ?Net -1695 ml  ? ?  ? ?  04/01/2022  ?  3:37 AM 03/31/2022  ?  2:03 AM 03/30/2022  ?  6:10 AM  ?Last 3 Weights  ?Weight (lbs) 171 lb 1.3 oz 173 lb 4.8 oz 175 lb 4.8 oz  ?Weight (kg) 77.601 kg 78.608 kg 79.516 kg  ?  Body mass index is 27.61 kg/m?.  ?General:  in no acute distress ?Head: Atraumatic, normal size  ?Neck:  Supple, no JVD ?Cardiac: Normal S1, S2; irregular rhythm, no murmur ?Lungs: Diminished breath sounds R base ?Abd: Soft, nontender, no hepatomegaly  ?Ext: No edema ?Musculoskeletal: No deformities, ?Skin: Warm and dry, no rashes   ?Neuro:no focal deficits  ?Psych: Normal mood and affect  ? ?Labs  ?High Sensitivity Troponin:   ?Recent Labs  ?Lab 03/17/22 ?2220 03/19/22 ?2107 03/19/22 ?2317 03/27/22 ?0272 03/27/22 ?1404  ?TROPONINIHS '7 8 7 7 7  '$ ? ?   ?Cardiac EnzymesNo results for input(s): TROPONINI in the last 168 hours. No results for input(s): TROPIPOC in the last 168 hours.  ?Chemistry ?Recent Labs  ?Lab 03/30/22 ?5366 03/31/22 ?4403 04/01/22 ?0432  ?NA 138 138 138  ?K 4.5 3.6 5.3*  ?CL 100 97* 101  ?CO2 '29 31 25  '$ ?GLUCOSE 106* 104* 111*  ?BUN 19 22 28*  ?CREATININE 1.32* 1.28* 1.33*  ?CALCIUM 8.7* 8.8* 8.9  ?GFRNONAA 39* 41* 39*  ?ANIONGAP '9 10 12  '$ ? ?  ?Hematology ?Recent Labs  ?Lab 03/29/22 ?0427 03/30/22 ?4742 03/31/22 ?5956  ?WBC 8.3 7.7 7.4  ?RBC 3.65* 3.36* 3.45*  ?HGB 10.0* 9.3* 9.3*  ?HCT 32.5* 30.4* 30.5*  ?MCV 89.0 90.5 88.4  ?MCH 27.4 27.7 27.0   ?MCHC 30.8 30.6 30.5  ?RDW 18.5* 18.2* 18.1*  ?PLT 251 224 233  ? ? ?BNP ?Recent Labs  ?Lab 03/27/22 ?0837  ?BNP 449.7*  ? ?  ?DDimer No results for input(s): DDIMER in the last 168 hours.  ? ?Radiology  ?DG CHEST PORT 1 VIEW ? ?Result Date: 03/31/2022 ?CLINICAL DATA:  88 year old female with pleural effusion EXAM: PORTABLE CHEST 1 VIEW COMPARISON:  03/29/2022 FINDINGS: Cardiomediastinal silhouette unchanged with cardiomegaly. Opacities at the bilateral lung bases persists with obscuration of the bilateral hemidiaphragm and the heart borders. No pneumothorax. No interlobular septal thickening. IMPRESSION: Unchanged appearance of the chest x-ray, with opacities at the bilateral lung bases likely a combination of pleural effusion and atelectasis/consolidation. Cardiomegaly. Electronically Signed   By: Corrie Mckusick D.O.   On: 03/31/2022 12:04   ? ?Cardiac Studies  ?TTE 03/27/2022 ? 1. Left ventricular ejection fraction, by estimation, is 50 to 55%. The  ?left ventricle has low normal function. The left ventricle demonstrates  ?global hypokinesis. There is mild left ventricular hypertrophy. Left  ?ventricular diastolic function could not  ? be evaluated. There is incoordinate septal motion.  ? 2. Right ventricular systolic function is normal. The right ventricular  ?size is normal. There is mildly elevated pulmonary artery systolic  ?pressure. The estimated right ventricular systolic pressure is 38.7 mmHg.  ? 3. Left atrial size was severely dilated.  ? 4. Right atrial size was moderately dilated.  ? 5. A small pericardial effusion is present. The pericardial effusion is  ?posterior to the left ventricle.  ? 6. The mitral valve is abnormal. Mild mitral valve regurgitation.  ? 7. The aortic valve is tricuspid. Aortic valve regurgitation is not  ?visualized. Aortic valve sclerosis is present, with no evidence of aortic  ?valve stenosis.  ? 8. The inferior vena cava is normal in size with greater than 50%  ?respiratory  variability, suggesting right atrial pressure of 3 mmHg.  ? ?Patient Profile  ?Anne Roach is a 88 y.o. female with hypertension, diabetes, permanent atrial fibrillation, diastolic heart failure who was admitted on 03/27/2022 for acute on chronic diastolic heart failure. ? ?Assessment & Plan  ? ?#Acute on chronic diastolic heart failure, EF 50 to 55% ?-Starting to become orthostatic, IV Lasix discontinued yesterday.  Check orthostatics today, if no orthostatic hypotension will start PO lasix ?-CT chest with moderate right pleural effusion. Suspect this is the source of her continued dyspnea despite good diuresis, would consider thoracentesis ?-Continue Jardiance 10 mg daily.  Hold Aldactone given mild hyperkalemia this morning ? ?#Pleural effusions ?- Net - 7.5 L since admission.  CT chest 5/14 with moderate right pleural effusion, would consider thoracentesis ? ?#Dysphagia ?-Reports having dysphagia/vomiting when she eats.  Swallow study unremarkable, consider GI referral if recurs ? ?#Permanent atrial fibrillation ?-Rate controlled on 50 mg of metoprolol succinate and Eliquis ? ?For questions or updates, please contact [place] ?Please consult [email or web address] for contact info under  ? ?Signed, ?Donato Heinz, MD ? ? ?

## 2022-04-01 NOTE — Progress Notes (Signed)
Mobility Specialist Progress Note: ? ? 04/01/22 1130  ?Mobility  ?Activity Ambulated with assistance in hallway  ?Level of Assistance Standby assist, set-up cues, supervision of patient - no hands on  ?Assistive Device Four wheel walker  ?Distance Ambulated (ft) 200 ft  ?Activity Response Tolerated well  ?$Mobility charge 1 Mobility  ? ?Pt received agreeable to mobility session. Required minG throughout for safety. Session focuses on rollator trial. Required x1 seated rest break d/t fatigue. SpO2 96% throughout. Back in bed with all needs met, daughter in room.  ? ?Nelta Numbers ?Acute Rehab ?Secure Chat or ?Office Phone: [phone] ? ?

## 2022-04-01 NOTE — Progress Notes (Signed)
?PROGRESS NOTE ? ? ? ?[name]  VPX:[id number] DOB: December 11, 1932 DOA: 03/27/2022 ?PCP: Lin Landsman, MD  ?Anne Roach is a 88 y.o. female with medical history significant of PAF on Eliquis; chronic diastolic CHF; DM; and HTN presenting with SOB. Symptoms started on 5/1.  Had multiple ED trips in the last week for dyspnea, often self-limiting. ?-History of dyspnea on exertion, chronic lower extremity edema, in the ED BNP was 449, troponin was negative, chest x-ray noted bilateral pleural effusions, noted to be in A-fib with heart rate in the 120, 130 range. ?-CT with moderate right effusion ?-Improving slowly with diuretics, plan for thoracentesis ? ?Subjective: ?-Feels tired and weak, denies any dizziness, breathing continues to improve ? ?Assessment and Plan: ? ?Acute on chronic diastolic CHF ?Bilateral pleural effusions ?-Third ED visit since 5/11 for dyspnea  ?-Chest x-ray with bilateral pleural effusion and pulmonary vascular congestion  ?-Echo with EF of 50%, mild LVH, indeterminate diastolic parameters, mild PAH ?-Diuresed with IV Lasix, he is 7.5 L negative, weight down 9lbs ?-Discussed with cards and daughter today, will obtain ultrasound-guided thoracentesis, resume oral Lasix later today or tomorrow, check orthostatics ?-Continue Farxiga, hold Aldactone today with hyperkalemia ?-BMP in a.m. ?-SLP eval completed, mild aspiration risk only ?  ?Afib ?-Rate relatively controlled, cards following switched from diltiazem to metoprolol, EF felt to be slightly lower ?-Continue Eliquis ? ?Chronic knee pain ?-Suspect chronic osteoarthritis, add Voltaren gel, takes Tylenol and tramadol at home ? ?Hyperkalemia ?-Hold Aldactone, Lokelma x1 ? ?Low back pain ?-Continue lidocaine patch ?  ?DM ?-CBGs are stable, Glucophage on hold ?-Continue moderate-scale SSI  ? ?Acute on chronic anemia ?-No overt bleeding, stable, monitor ?  ?Chronic pain ?-Continue Ultram, Neurontin ?  ?Stage 3a CKD ?-Appears relatively  stable ? ?DVT prophylaxis: Eliquis ?CODE STATUS: Full code ?Family communication: Discussed with patient detail, no family at bedside ?Disposition, home likely 1 to 2 days ?  ? ?Procedures:  ? ?Antimicrobials:  ? ? ?Objective: ?Vitals:  ? 03/31/22 2022 04/01/22 0337 04/01/22 1000 04/01/22 1150  ?BP: 110/83 129/76  115/71  ?Pulse: 95 85  70  ?Resp: '18 18 17 17  '$ ?Temp: 98.7 ?F (37.1 ?C) 98.8 ?F (37.1 ?C) 98.3 ?F (36.8 ?C) 98.6 ?F (37 ?C)  ?TempSrc: Oral Oral Oral Oral  ?SpO2: 95% 92%  90%  ?Weight:  77.6 kg    ?Height:      ? ? ?Intake/Output Summary (Last 24 hours) at 04/01/2022 1156 ?Last data filed at 04/01/2022 0755 ?Gross per 24 hour  ?Intake 835 ml  ?Output 2950 ml  ?Net -2115 ml  ? ?Filed Weights  ? 03/30/22 0610 03/31/22 0203 04/01/22 0337  ?Weight: 79.5 kg 78.6 kg 77.6 kg  ? ? ?Examination: ? ?General exam: Obese elderly female sitting up in bed, AAO x3, slightly uncomfortable appearing ?JVD: Positive JVD ?CVS: S1-S2, irregularly irregular rhythm ?Lungs: Decreased breath sounds bases ?Abdomen: Soft, nontender, bowel sounds present ?Extremities: Trace edema  ?Skin: No rashes ?Psychiatry:  Mood & affect appropriate.  ? ? ? ?Data Reviewed:  ? ?CBC: ?Recent Labs  ?Lab 03/27/22 ?4627 03/28/22 ?0401 03/29/22 ?0427 03/30/22 ?0350 03/31/22 ?0938  ?WBC 7.9 6.9 8.3 7.7 7.4  ?NEUTROABS 5.3  --   --   --   --   ?HGB 9.3* 8.8* 10.0* 9.3* 9.3*  ?HCT 31.6* 29.5* 32.5* 30.4* 30.5*  ?MCV 91.1 90.8 89.0 90.5 88.4  ?PLT 267 233 251 224 233  ? ?Basic Metabolic Panel: ?Recent Labs  ?Lab 03/27/22 ?1400  03/28/22 ?0401 03/29/22 ?0427 03/30/22 ?1610 03/31/22 ?9604 04/01/22 ?0432  ?NA  --  137 138 138 138 138  ?K  --  3.7 4.2 4.5 3.6 5.3*  ?CL  --  102 101 100 97* 101  ?CO2  --  '24 29 29 31 25  '$ ?GLUCOSE  --  100* 105* 106* 104* 111*  ?BUN  --  '13 15 19 22 '$ 28*  ?CREATININE  --  1.11* 1.22* 1.32* 1.28* 1.33*  ?CALCIUM  --  8.3* 8.8* 8.7* 8.8* 8.9  ?MG 2.4  --   --   --   --  2.6*  ? ?GFR: ?Estimated Creatinine Clearance: 31.3 mL/min (A)  (by C-G formula based on SCr of 1.33 mg/dL (H)). ?Liver Function Tests: ?No results for input(s): AST, ALT, ALKPHOS, BILITOT, PROT, ALBUMIN in the last 168 hours. ?No results for input(s): LIPASE, AMYLASE in the last 168 hours. ?No results for input(s): AMMONIA in the last 168 hours. ?Coagulation Profile: ?No results for input(s): INR, PROTIME in the last 168 hours. ?Cardiac Enzymes: ?No results for input(s): CKTOTAL, CKMB, CKMBINDEX, TROPONINI in the last 168 hours. ?BNP (last 3 results) ?No results for input(s): PROBNP in the last 8760 hours. ?HbA1C: ?No results for input(s): HGBA1C in the last 72 hours. ? ?CBG: ?Recent Labs  ?Lab 03/31/22 ?1115 03/31/22 ?1622 03/31/22 ?2121 04/01/22 ?5409 04/01/22 ?1152  ?GLUCAP 134* 162* 171* 120* 120*  ? ?Lipid Profile: ?No results for input(s): CHOL, HDL, LDLCALC, TRIG, CHOLHDL, LDLDIRECT in the last 72 hours. ?Thyroid Function Tests: ?No results for input(s): TSH, T4TOTAL, FREET4, T3FREE, THYROIDAB in the last 72 hours. ?Anemia Panel: ?No results for input(s): VITAMINB12, FOLATE, FERRITIN, TIBC, IRON, RETICCTPCT in the last 72 hours. ? ?Urine analysis: ?   ?Component Value Date/Time  ? COLORURINE YELLOW 01/18/2021 1059  ? APPEARANCEUR CLEAR 01/18/2021 1059  ? LABSPEC 1.013 01/18/2021 1059  ? PHURINE 6.0 01/18/2021 1059  ? GLUCOSEU NEGATIVE 01/18/2021 1059  ? HGBUR NEGATIVE 01/18/2021 1059  ? [place] NEGATIVE 01/18/2021 1059  ? [place] NEGATIVE 01/18/2021 1059  ? PROTEINUR NEGATIVE 01/18/2021 1059  ? UROBILINOGEN 0.2 06/12/2011 1201  ? NITRITE NEGATIVE 01/18/2021 1059  ? LEUKOCYTESUR NEGATIVE 01/18/2021 1059  ? ?Sepsis Labs: ?'@LABRCNTIP'$ (procalcitonin:4,lacticidven:4) ? ?)No results found for this or any previous visit (from the past 240 hour(s)).  ? ?Radiology Studies: ?DG CHEST PORT 1 VIEW ? ?Result Date: 03/31/2022 ?CLINICAL DATA:  88 year old female with pleural effusion EXAM: PORTABLE CHEST 1 VIEW COMPARISON:  03/29/2022 FINDINGS: Cardiomediastinal silhouette  unchanged with cardiomegaly. Opacities at the bilateral lung bases persists with obscuration of the bilateral hemidiaphragm and the heart borders. No pneumothorax. No interlobular septal thickening. IMPRESSION: Unchanged appearance of the chest x-ray, with opacities at the bilateral lung bases likely a combination of pleural effusion and atelectasis/consolidation. Cardiomegaly. Electronically Signed   By: Corrie Mckusick D.O.   On: 03/31/2022 12:04   ? ? ?Scheduled Meds: ? allopurinol  100 mg Oral Daily  ? apixaban  5 mg Oral BID  ? diclofenac Sodium  2 g Topical BID  ? docusate sodium  100 mg Oral BID  ? empagliflozin  10 mg Oral Daily  ? feeding supplement  237 mL Oral Q1500  ? folic acid  8,119 mcg Oral Daily  ? gabapentin  300 mg Oral TID  ? insulin aspart  0-15 Units Subcutaneous TID WC  ? insulin aspart  0-5 Units Subcutaneous QHS  ? lidocaine  1 patch Transdermal Q24H  ? metoprolol succinate  50 mg Oral Daily  ?  nystatin   Topical BID  ? sodium chloride flush  3 mL Intravenous Q12H  ? ?Continuous Infusions: ? ? LOS: 5 days  ? ? ?Time spent: 80mn ? ?PDomenic Polite MD ?[facility] ? ? ?04/01/2022, 11:56 AM  ?  ?

## 2022-04-01 NOTE — Progress Notes (Addendum)
Heart Failure Stewardship Pharmacist Progress Note ? ? ?PCP: Lin Landsman, MD ?PCP-Cardiologist: Jenkins Rouge, MD  ? ? ?HPI:  ?88 yo female with PMH of T2DM, HTN, OSA, permanent Afib (PTA Eliquis), chronic diastolic CHF, and gout. Presented to ED with worsening dyspnea arrived on 2L [place] with O2 sat 89% and in Afib. Patient endorsed orthopnea and denied weight gain or LEE outside of her baseline swelling. Echo 05/11 with LVEF 50-55%, mild LVH, normal RV, mildly elevated pulmonary pressures, biatrial dilation, and mild MVR. Compliant to PTA diuretic regimen. Notable PTA medications include diltiazem 240 mg daily + PRN (required dose this past week).  ?Discharge delayed with need for ongoing diuresis and swallow study. Found to have mild aspiration risk. Now planning on thoracentesis for moderate R effusion on chest CT. Reported orthostasis yesterday, IV diuretics discontinued.  ? ?Current HF Medications: ?Diuretic: furosemide 20 mg IV BID - held today ?Beta Blocker: metoprolol succinate 50 mg daily ?Aldosterone Antagonist: spironolactone 25 mg daily - held today ?SGLT2i: Jardiance 10 mg daily ? ?Prior to admission HF Medications: ?Diuretic: furosemide 40 mg daily ?Other: potassium 20 mEq daily ? ?Pertinent Lab Values: ?Serum creatinine 1.33 (up), BUN 28, Potassium 5.3 (slight hemolysis), Sodium 138, BNP 449.7, A1c 6.1%  ? ?Vital Signs: ?Weight: 171 lbs (admission weight: 180.5 lbs) ?Blood pressure: 100-110/70s  ?Heart rate: 80-90s  ?I/O: -2.85L yesterday; net -8L ? ?Medication Assistance / Insurance Benefits Check: ?Does the patient have prescription insurance?  Yes ?Type of insurance plan: Medicare/Medicaid ? ?Outpatient Pharmacy:  ?Prior to admission outpatient pharmacy: [facility] ?Is the patient willing to use [facility] pharmacy at discharge? Pending ?Is the patient willing to transition their outpatient pharmacy to utilize a [facility] outpatient pharmacy?   Pending ?  ? ?Assessment: ?1. Acute on chronic diastolic  CHF (LVEF 30-94%). NYHA class III symptoms. ?Patient improving with diuresis, endorses improved breathing but chest effusion on CT likely culprit of O2 sat in the low 90s on RA.  ?- Diuretics and spironolactone on hold with lightheadedness when standing.  ?- Continue metoprolol succinate 50 mg daily ?- Dizziness limiting use of ARNI ?- Continue Jardiance 10 mg daily ?  ?Plan: ?1) Medication changes recommended at this time: ?- Continue current regimen.  ?- Would hold off on Lokelma dose. Repeat BMP with slight hemolysis ?- Likely restart spironolactone 12.5 mg daily tomorrow if SCr and potassium stable ?- Per previous discussion with Dr. Harrell Gave, VT on EKG likely an artifact. Will continue to monitor for NSVT with option to switch to metoprolol tartrate 25 mg BID if any additional episodes ?- Will need to reschedule TOC visit on 05/17 ? ?2) Patient assistance: ?- pending patient discussion ? ?3)  Education  ?- To be completed prior to discharge ?- Ensure PTA diltiazem discontinued at discharge ? ?Laurey Arrow, PharmD ?PGY1 Pharmacy Resident ?04/01/2022  1:10 PM ?

## 2022-04-01 NOTE — Progress Notes (Signed)
Mobility Specialist Progress Note: ? ? 04/01/22 1545  ?Orthostatic Lying   ?BP- Lying 115/63  ?Orthostatic Sitting  ?BP- Sitting 110/75  ?Orthostatic Standing at 0 minutes  ?BP- Standing at 0 minutes 92/52  ?Orthostatic Standing at 3 minutes  ?BP- Standing at 3 minutes 104/62  ?Mobility  ?Activity Stood at bedside  ?Level of Assistance Contact guard assist, steadying assist  ?Assistive Device Front wheel walker  ?Activity Response Tolerated well  ?$Mobility charge 1 Mobility  ? ?RN requesting orthostatic bps be taken. Pt asymptomatic throughout. Back in bed with all needs met, daughter in room.  ? ?Nelta Numbers ?Acute Rehab ?Secure Chat or ?Office Phone: [phone] ? ?

## 2022-04-02 ENCOUNTER — Other Ambulatory Visit (HOSPITAL_COMMUNITY): Payer: Self-pay

## 2022-04-02 ENCOUNTER — Encounter (HOSPITAL_COMMUNITY): Payer: Medicare HMO

## 2022-04-02 DIAGNOSIS — I5033 Acute on chronic diastolic (congestive) heart failure: Secondary | ICD-10-CM | POA: Diagnosis not present

## 2022-04-02 DIAGNOSIS — I4821 Permanent atrial fibrillation: Secondary | ICD-10-CM | POA: Diagnosis not present

## 2022-04-02 LAB — CBC
HCT: 29.5 % — ABNORMAL LOW (ref 36.0–46.0)
Hemoglobin: 8.9 g/dL — ABNORMAL LOW (ref 12.0–15.0)
MCH: 26.9 pg (ref 26.0–34.0)
MCHC: 30.2 g/dL (ref 30.0–36.0)
MCV: 89.1 fL (ref 80.0–100.0)
Platelets: 213 10*3/uL (ref 150–400)
RBC: 3.31 MIL/uL — ABNORMAL LOW (ref 3.87–5.11)
RDW: 17.7 % — ABNORMAL HIGH (ref 11.5–15.5)
WBC: 7.2 10*3/uL (ref 4.0–10.5)
nRBC: 0 % (ref 0.0–0.2)

## 2022-04-02 LAB — BASIC METABOLIC PANEL
Anion gap: 8 (ref 5–15)
BUN: 23 mg/dL (ref 8–23)
CO2: 28 mmol/L (ref 22–32)
Calcium: 8.7 mg/dL — ABNORMAL LOW (ref 8.9–10.3)
Chloride: 100 mmol/L (ref 98–111)
Creatinine, Ser: 1.08 mg/dL — ABNORMAL HIGH (ref 0.44–1.00)
GFR, Estimated: 50 mL/min — ABNORMAL LOW (ref >60)
Glucose, Bld: 90 mg/dL (ref 70–99)
Potassium: 4.1 mmol/L (ref 3.5–5.1)
Sodium: 136 mmol/L (ref 135–145)

## 2022-04-02 LAB — GLUCOSE, CAPILLARY
Glucose-Capillary: 116 mg/dL — ABNORMAL HIGH (ref 70–99)
Glucose-Capillary: 179 mg/dL — ABNORMAL HIGH (ref 70–99)

## 2022-04-02 MED ORDER — FUROSEMIDE 40 MG PO TABS: 40.0000 mg | ORAL_TABLET | ORAL | Status: DC | PRN

## 2022-04-02 MED ORDER — GABAPENTIN 400 MG PO CAPS: 400.0000 mg | ORAL_CAPSULE | Freq: Three times a day (TID) | ORAL | Status: DC

## 2022-04-02 MED ORDER — METOPROLOL SUCCINATE ER 50 MG PO TB24
50.0000 mg | ORAL_TABLET | Freq: Every day | ORAL | 0 refills | Status: DC
  Filled 2022-04-02: qty 30, 30d supply, fill #0

## 2022-04-02 MED ORDER — POTASSIUM CHLORIDE ER 20 MEQ PO TBCR
20.0000 meq | EXTENDED_RELEASE_TABLET | ORAL | Status: DC | PRN
Start: 2022-04-02 — End: 2022-04-09

## 2022-04-02 MED ORDER — GABAPENTIN 100 MG PO CAPS: 100.0000 mg | ORAL_CAPSULE | Freq: Once | ORAL | Status: DC

## 2022-04-02 MED ORDER — TRAMADOL HCL 50 MG PO TABS: 50.0000 mg | ORAL_TABLET | Freq: Two times a day (BID) | ORAL | Status: DC | PRN

## 2022-04-02 MED ORDER — EMPAGLIFLOZIN 10 MG PO TABS
10.0000 mg | ORAL_TABLET | Freq: Every day | ORAL | 0 refills | Status: DC
  Filled 2022-04-02: qty 30, 30d supply, fill #0

## 2022-04-02 NOTE — Consult Note (Signed)
? ?  [facility] CM Inpatient Consult ? ? ?04/02/2022 ? ?[place] ?10-Nov-1933 ?[id number] ? ?[place] [facility] [ACO] Patient: Anne Roach ? ?Primary Care Provider:  Lin Landsman, MD with [facility], which is not a [facility] provider [patient listed in [facility] with Lucianne Lei, MD] ? ?Met with patient and daughter at the bedside. Explained to daughter reason for visit for follow up with PCP for TOC and AWV. Daughter confirms that her mother no longer sees Dr. Criss Rosales and endorses Dr. Lin Landsman as her primary care. ? ?Plan:  Will sign off patient is not in network. ? ?For questions contact:  ? ?Natividad Brood, RN BSN CCM ?[facility] Liaison ? [phone] business mobile phone ?Toll free office [phone]  ?Fax number: [phone] ?[email or web address]  ? ? ? ?

## 2022-04-02 NOTE — Progress Notes (Addendum)
? ?Progress Note ? ?Patient Name: Anne Roach ?Date of Encounter: 04/02/2022 ? ?[facility] Cardiologist: Jenkins Rouge, MD  ? ?Subjective  ? ?Pt found sitting in recliner with legs elevated. IR did not feel her effusions were large enough for thoracentesis. Her breathing is improved and she is on room air. She remains dizzy in the recliner. She has walked in the halls without dizziness. She is eager for discharge. ? ?Inpatient Medications  ?  ?Scheduled Meds: ? allopurinol  100 mg Oral Daily  ? apixaban  5 mg Oral BID  ? diclofenac Sodium  2 g Topical BID  ? docusate sodium  100 mg Oral BID  ? empagliflozin  10 mg Oral Daily  ? feeding supplement  237 mL Oral Q1500  ? folic acid  4,098 mcg Oral Daily  ? gabapentin  300 mg Oral TID  ? insulin aspart  0-15 Units Subcutaneous TID WC  ? insulin aspart  0-5 Units Subcutaneous QHS  ? lidocaine  1 patch Transdermal Q24H  ? metoprolol succinate  50 mg Oral Daily  ? nystatin   Topical BID  ? sodium chloride flush  3 mL Intravenous Q12H  ? ?Continuous Infusions: ? ?PRN Meds: ?acetaminophen **OR** acetaminophen, alum & mag hydroxide-simeth, bisacodyl, hydrALAZINE, ondansetron **OR** ondansetron (ZOFRAN) IV, polyethylene glycol, traMADol, traZODone  ? ?Vital Signs  ?  ?Vitals:  ? 04/01/22 1000 04/01/22 1150 04/01/22 2003 04/02/22 0500  ?BP:  115/71 (!) 113/58 120/90  ?Pulse:  70 95 93  ?Resp: '17 17 17 17  '$ ?Temp: 98.3 ?F (36.8 ?C) 98.6 ?F (37 ?C) 97.9 ?F (36.6 ?C) 97.8 ?F (36.6 ?C)  ?TempSrc: Oral Oral Oral Oral  ?SpO2:  90% 91%   ?Weight:    77.6 kg  ?Height:      ? ? ?Intake/Output Summary (Last 24 hours) at 04/02/2022 1011 ?Last data filed at 04/01/2022 2100 ?Gross per 24 hour  ?Intake 358 ml  ?Output 400 ml  ?Net -42 ml  ? ? ?  04/02/2022  ?  5:00 AM 04/01/2022  ?  3:37 AM 03/31/2022  ?  2:03 AM  ?Last 3 Weights  ?Weight (lbs) 171 lb 1.2 oz 171 lb 1.3 oz 173 lb 4.8 oz  ?Weight (kg) 77.6 kg 77.601 kg 78.608 kg  ?   ? ?Telemetry  ?  ?Atrial fibrillation with ventricular  rate 80-90s - Personally Reviewed ? ?ECG  ?  ?No new tracings - Personally Reviewed ? ?Physical Exam  ? ?GEN: No acute distress.   ?Neck: No JVD ?Cardiac: irregular rhythm, regular rate ?Respiratory: crackles in bases B, room air ?GI: Soft, nontender, non-distended  ?MS: No edema; No deformity. ?Neuro:  Nonfocal  ?Psych: Normal affect  ? ?Labs  ?  ?High Sensitivity Troponin:   ?Recent Labs  ?Lab 03/17/22 ?2220 03/19/22 ?2107 03/19/22 ?2317 03/27/22 ?1191 03/27/22 ?1404  ?TROPONINIHS '7 8 7 7 7  '$ ?   ?Chemistry ?Recent Labs  ?Lab 03/27/22 ?1400 03/28/22 ?0401 03/31/22 ?4782 04/01/22 ?9562 04/02/22 ?0121  ?NA  --    < > 138 138 136  ?K  --    < > 3.6 5.3* 4.1  ?CL  --    < > 97* 101 100  ?CO2  --    < > '31 25 28  '$ ?GLUCOSE  --    < > 104* 111* 90  ?BUN  --    < > 22 28* 23  ?CREATININE  --    < > 1.28* 1.33* 1.08*  ?CALCIUM  --    < >  8.8* 8.9 8.7*  ?MG 2.4  --   --  2.6*  --   ?GFRNONAA  --    < > 41* 39* 50*  ?ANIONGAP  --    < > '10 12 8  '$ ? < > = values in this interval not displayed.  ?  ?Lipids No results for input(s): CHOL, TRIG, HDL, LABVLDL, LDLCALC, CHOLHDL in the last 168 hours.  ?Hematology ?Recent Labs  ?Lab 03/30/22 ?2774 03/31/22 ?1287 04/02/22 ?0121  ?WBC 7.7 7.4 7.2  ?RBC 3.36* 3.45* 3.31*  ?HGB 9.3* 9.3* 8.9*  ?HCT 30.4* 30.5* 29.5*  ?MCV 90.5 88.4 89.1  ?MCH 27.7 27.0 26.9  ?MCHC 30.6 30.5 30.2  ?RDW 18.2* 18.1* 17.7*  ?PLT 224 233 213  ? ?Thyroid No results for input(s): TSH, FREET4 in the last 168 hours.  ?BNP ?Recent Labs  ?Lab 03/27/22 ?0837  ?BNP 449.7*  ?  ?DDimer No results for input(s): DDIMER in the last 168 hours.  ? ?Radiology  ?  ?DG CHEST PORT 1 VIEW ? ?Result Date: 03/31/2022 ?CLINICAL DATA:  88 year old female with pleural effusion EXAM: PORTABLE CHEST 1 VIEW COMPARISON:  03/29/2022 FINDINGS: Cardiomediastinal silhouette unchanged with cardiomegaly. Opacities at the bilateral lung bases persists with obscuration of the bilateral hemidiaphragm and the heart borders. No pneumothorax. No  interlobular septal thickening. IMPRESSION: Unchanged appearance of the chest x-ray, with opacities at the bilateral lung bases likely a combination of pleural effusion and atelectasis/consolidation. Cardiomegaly. Electronically Signed   By: Corrie Mckusick D.O.   On: 03/31/2022 12:04  ? ?IR US CHEST ? ?Result Date: 04/01/2022 ?CLINICAL DATA:  Pleural effusion EXAM: CHEST ULTRASOUND COMPARISON:  None Available. FINDINGS: Focused sonographic exam of the bilateral chest demonstrates bilateral small volume pleural effusions, insufficient for safe image guided thoracentesis. No intervention performed. IMPRESSION: Small bilateral pleural effusions.  No thoracentesis performed. Electronically Signed   By: Albin Felling M.D.   On: 04/01/2022 15:49   ? ?Cardiac Studies  ? ?TTE 03/27/2022 ? 1. Left ventricular ejection fraction, by estimation, is 50 to 55%. The  ?left ventricle has low normal function. The left ventricle demonstrates  ?global hypokinesis. There is mild left ventricular hypertrophy. Left  ?ventricular diastolic function could not  ? be evaluated. There is incoordinate septal motion.  ? 2. Right ventricular systolic function is normal. The right ventricular  ?size is normal. There is mildly elevated pulmonary artery systolic  ?pressure. The estimated right ventricular systolic pressure is 86.7 mmHg.  ? 3. Left atrial size was severely dilated.  ? 4. Right atrial size was moderately dilated.  ? 5. A small pericardial effusion is present. The pericardial effusion is  ?posterior to the left ventricle.  ? 6. The mitral valve is abnormal. Mild mitral valve regurgitation.  ? 7. The aortic valve is tricuspid. Aortic valve regurgitation is not  ?visualized. Aortic valve sclerosis is present, with no evidence of aortic  ?valve stenosis.  ? 8. The inferior vena cava is normal in size with greater than 50%  ?respiratory variability, suggesting right atrial pressure of 3 mmHg.  ? ?Patient Profile  ?   ?88 y.o. female with  hypertension, diabetes, permanent atrial fibrillation, diastolic heart failure who was admitted on 03/27/2022 for acute on chronic diastolic heart failure. Of note, this is her third ER visit since 03/27/22 for dyspnea.  ? ?Assessment & Plan  ?  ?Acute on chronic diastolic heart failure ?- LVEF 50-55% ?- CXR with bilateral pleural effusions and vascular congestion ?- suspect dyspnea likely  due to HFpEF + pleural effusions ?- she is now on room air ?- unclear if her dizziness is related to BP - she dizzy sitting in the chair ?- jardiance has been started - unclear what dose of PO lasix she will need to take with this on board ?- consider restarting only 20 mg lasix PO and monitor symptoms - she will need to ambulate after this ? ? ?Orthostatic hypotension ?- dizziness with drop in BP when standing from sitting ?- orthostatic vitals yesterday with a 23 mmHg drop from lying to standing ?- IV diuresis was paused, have not added back PO yet ?- dizziness reported with sitting still - unclear that this is related to vitals - she also reports poor sleep and significant leg pain (has not been getting home dose of gabapentin, oxycodone makes her hallucinate) ? ? ?Permanent atrial fibrillation ?Chronic anticoagulation ?Rate controlled on 50 mg metoprolol and [place] with eliquis ?Appropriately dosed on eliquis ?Home cardizem has been held for marginal BP and orthostatic hypotension ? ? ?Pleural effusions ?- CT chest 5/14 with moderate right pleural effusion ?- IR consulted for thoracentesis, but felt she did not have enough fluid for procedure ?- will continue to diurese ? ? [facility] will sign off.   ?Medication Recommendations: Jardiance 10 mg daily, Toprol-XL 50 mg daily, Eliquis 5 mg twice daily.  Recommend daily weights and take Lasix 40 mg as needed if weight gain more than 3 pounds in 1 day or 5 pounds in 1 week ?Other recommendations (labs, testing, etc): None ?Follow up as an outpatient: Scheduled for 5/24 ? ? ?For  questions or updates, please contact [place] ?Please consult [email or web address] for contact info under  ? ?  ?   ?Signed, ?Ledora Bottcher, PA  ?04/02/2022, 10:11 AM   ? ?Patient seen and examined.  Agree with

## 2022-04-02 NOTE — Progress Notes (Signed)
Pt has orders to be discharged. Discharge instructions given and pt has no additional questions at this time. Medication regimen reviewed and pt educated. Pt verbalized understanding and has no additional questions. Telemetry box removed. IV removed and site in good condition. Pt stable and waiting for transportation. 

## 2022-04-02 NOTE — Discharge Summary (Signed)
Physician Discharge Summary  Anne Roach NHA:[id number] DOB: 1932-12-20 DOA: 03/27/2022  PCP: Lin Landsman, MD  Admit date: 03/27/2022 Discharge date: 04/02/2022  Time spent: 35  minutes  Recommendations for Outpatient Follow-up:  Heart failure ToC clinic next week, please titrate diuretics and change to scheduled if ORthostatic symptoms improved Cards FU 5/24 PCP Dr.Reese in 1 week, consider goals of care and Code status discussions   Discharge Diagnoses:  Principal Problem:   Acute on chronic diastolic CHF (congestive heart failure) (HCC) Orthostatic hypotension   Type 2 diabetes mellitus (HCC)   Chronic kidney disease (CKD), stage III (moderate)   Permanent atrial fibrillation (HCC)   Chronic leg and knee pains   Pleural effusion Mild dysphagia   Discharge Condition: Stable  Diet recommendation: Low-sodium, diabetic, heart healthy  Filed Weights   03/31/22 0203 04/01/22 0337 04/02/22 0500  Weight: 78.6 kg 77.6 kg 77.6 kg    History of present illness:  Anne Roach is a 88 y.o. female with medical history significant of PAF on Eliquis; chronic diastolic CHF; DM; and HTN presenting with SOB. Symptoms started on 5/1.  Had multiple ED trips in the last week for dyspnea, often self-limiting. -History of dyspnea on exertion, chronic lower extremity edema, in the ED BNP was 449, troponin was negative, chest x-ray noted bilateral pleural effusions, noted to be in A-fib with heart rate in the 120, 130 range. -CT with moderate right effusion  Hospital Course:   Acute on chronic diastolic CHF Bilateral pleural effusions -Third ED visit since 5/11 for dyspnea  -Chest x-ray with bilateral pleural effusion and pulmonary vascular congestion  -Echo with EF of 50%, mild LVH, indeterminate diastolic parameters, mild PAH -Diuresed with IV Lasix, he is 9 L negative, weight down 9 lbs -We attempted thoracentesis but fluid was too little to be drained per IR [facility] course  complicated by orthostatic hypotension, dizziness -Jardiance, Toprol continued, Lasix dose changed to 40 Mg as needed for weight gain of 3 pounds in 1 day or 5 pounds in 1 week -Cardiology follow-up scheduled for 5/24 -Consider adding Aldactone back at follow-up and changing Lasix to scheduled if symptoms, blood pressure tolerates   Afib -Rate relatively controlled, cards following switched from diltiazem to metoprolol, EF felt to be slightly lower -Continue Eliquis   Hyperkalemia -Hold Aldactone, Lokelma x1   Low back pain -Continue lidocaine patch   DM -CBGs are stable, Glucophage on hold -Continue moderate-scale SSI    Acute on chronic anemia -No overt bleeding, stable, monitor   Chronic pain -Continue Ultram, Neurontin   Stage 3a CKD -Appears relatively stable   Consultations: Cardiology Discharge Exam: Vitals:   04/02/22 1000 04/02/22 1150  BP: (!) 132/106 104/61  Pulse:    Resp: 16   Temp:    SpO2:     General exam: Obese elderly female sitting up in bed, AAO x3, slightly uncomfortable appearing JVD: Positive JVD CVS: S1-S2, irregularly irregular rhythm Lungs: Decreased breath sounds bases Abdomen: Soft, nontender, bowel sounds present Extremities: Trace edema  Skin: No rashes Psychiatry:  Mood & affect appropriate.   Discharge Instructions   Discharge Instructions     Diet - low sodium heart healthy   Complete by: As directed    Increase activity slowly   Complete by: As directed       Allergies as of 04/02/2022   No Known Allergies      Medication List     STOP taking these medications    diltiazem 240  MG 24 hr capsule Commonly known as: CARDIZEM CD   diltiazem 30 MG tablet Commonly known as: Cardizem       TAKE these medications    Accu-Chek Aviva Plus test strip Generic drug: glucose blood   acetaminophen 500 MG tablet Commonly known as: TYLENOL Take 1,000 mg by mouth every 6 (six) hours as needed for mild pain or  headache.   allopurinol 100 MG tablet Commonly known as: ZYLOPRIM TAKE 1 TABLET(100 MG) BY MOUTH DAILY What changed: See the new instructions.   cholecalciferol 25 MCG (1000 UNIT) tablet Commonly known as: VITAMIN D3 Take 1,000 Units by mouth daily with breakfast.   diclofenac Sodium 1 % Gel Commonly known as: VOLTAREN Apply 2 g topically 4 (four) times daily. What changed:  when to take this reasons to take this   Eliquis 5 MG Tabs tablet Generic drug: apixaban TAKE 1 TABLET(5 MG) BY MOUTH TWICE DAILY What changed: See the new instructions.   empagliflozin 10 MG Tabs tablet Commonly known as: JARDIANCE Take 1 tablet (10 mg total) by mouth daily. Start taking on: Apr 03, 2022   ferrous sulfate 325 (65 FE) MG tablet Take 1 tablet (325 mg total) by mouth daily.   folic acid 081 MCG tablet Commonly known as: FOLVITE Take 800 mcg by mouth daily.   furosemide 40 MG tablet Commonly known as: Lasix Take 1 tablet (40 mg total) by mouth as needed. For weight gain, 2-3 pounds in 1 day or 5 pounds in 1 week What changed:  when to take this reasons to take this additional instructions   gabapentin 400 MG capsule Commonly known as: NEURONTIN Take 400 mg by mouth 3 (three) times daily.   ICAPS AREDS 2 PO Take 1 capsule by mouth 2 (two) times daily.   MAGNESIUM PO Take 1 tablet by mouth daily with lunch.   metFORMIN 750 MG 24 hr tablet Commonly known as: GLUCOPHAGE-XR Take 750 mg by mouth at bedtime.   metoprolol succinate 50 MG 24 hr tablet Commonly known as: TOPROL-XL Take 1 tablet (50 mg total) by mouth daily. Take with or immediately following a meal. Start taking on: Apr 03, 2022   Potassium Chloride ER 20 MEQ Tbcr Take 20 mEq by mouth as needed. On the days you take lasix What changed:  when to take this reasons to take this additional instructions   traMADol 50 MG tablet Commonly known as: ULTRAM Take 1 tablet (50 mg total) by mouth every 12 (twelve)  hours as needed. What changed:  when to take this reasons to take this       No Known Allergies  Follow-up Information     [place]. Go in 3 day(s).   Specialty: Cardiology Why: Hospital follow up Please bring medications list FREE valetparking, Entrance C, off [address] [employer] information: [address] [phone] [place], [facility] Follow up.   Specialty: [place] Why: home health services Contact information: [place] [address] [phone]                  The results of significant diagnostics from this hospitalization (including imaging, microbiology, ancillary and laboratory) are listed below for reference.    Significant Diagnostic Studies: DG Chest 2 View  Result Date: 03/27/2022 CLINICAL DATA:  Shortness of breath for 2 weeks EXAM: CHEST - 2 VIEW  COMPARISON:  Chest radiograph and chest CT 03/19/2022 FINDINGS: Heart is enlarged, unchanged. The upper mediastinal contours are stable. There are bilateral pleural effusions with adjacent bibasilar airspace opacities, overall slightly worsened since 03/19/2022. There is vascular congestion without definite overt pulmonary edema. There is no pneumothorax There is no acute osseous abnormality. IMPRESSION: Bilateral pleural effusions with adjacent bibasilar airspace opacities, worsened since 03/19/2022. Electronically Signed   By: Valetta Mole M.D.   On: 03/27/2022 09:03   DG Chest 2 View  Result Date: 03/17/2022 CLINICAL DATA:  Dyspnea and shortness of breath with hypoxia. EXAM: CHEST - 2 VIEW COMPARISON:  Portable chest 01/18/2021 FINDINGS: The heart is moderately enlarged. There is aortic atherosclerosis and tortuosity, and again noted mild perihilar vascular congestion without overt edema. There are small layering pleural effusions with overlying  atelectasis or consolidation in the bases. Lungs are otherwise clear with low inspiration. Thoracic spondylosis and osteopenia. Multilevel thoracic spine bridging enthesopathy. IMPRESSION: Cardiomegaly with perihilar vascular distension without overt edema, small pleural effusions, and overlying atelectasis or consolidation. Low inspiration. Electronically Signed   By: Telford Nab M.D.   On: 03/17/2022 20:45   CT CHEST WO CONTRAST  Result Date: 03/30/2022 CLINICAL DATA:  Pneumonia.  Possible pleural effusion. EXAM: CT CHEST WITHOUT CONTRAST TECHNIQUE: Multidetector CT imaging of the chest was performed following the standard protocol without IV contrast. RADIATION DOSE REDUCTION: This exam was performed according to the departmental dose-optimization program which includes automated exposure control, adjustment of the mA and/or kV according to patient size and/or use of iterative reconstruction technique. COMPARISON:  03/19/2022. FINDINGS: Cardiovascular: Heart is mildly enlarged with a left atrial predominance. Small pericardial effusion. Three-vessel coronary artery calcifications. Great vessels are normal in caliber. Stable aortic atherosclerotic calcifications. Mediastinum/Nodes: Prominent to mildly enlarged mediastinal lymph nodes, largest a right subcarinal node measuring 1.8 cm in short axis, without change. No mediastinal or hilar masses. Trachea and esophagus are unremarkable. Lungs/Pleura: Moderate right and trace left pleural effusions. Lung consolidation and/or atelectasis, right lower lobe tracking along the segmental bronchi, and to a lesser degree in the left lower lobe and medial aspects of the left upper lobe lingula and right middle lobe, similar to the recent prior CT. Remainder of the lungs is clear. No evidence of pulmonary edema. No pneumothorax. Upper Abdomen: No acute abnormality. Musculoskeletal: No fracture or acute finding. No bone lesion. No chest wall mass. IMPRESSION: 1. No  significant change from the recent prior chest CT. 2. Cardiomegaly, small pericardial effusion, moderate right and trace left pleural effusions. Dependent lung opacity, most prominently in the right lower lobe, likely atelectasis. Pneumonia is possible. No evidence of pulmonary edema. Mildly enlarged right subcarinal lymph node, presumed reactive. 3. Coronary artery calcifications and aortic atherosclerosis. Aortic Atherosclerosis (ICD10-I70.0). Electronically Signed   By: Lajean Manes M.D.   On: 03/30/2022 10:19   CT Angio Chest PE W and/or Wo Contrast  Result Date: 03/19/2022 CLINICAL DATA:  Concern for pulmonary embolism. EXAM: CT ANGIOGRAPHY CHEST WITH CONTRAST TECHNIQUE: Multidetector CT imaging of the chest was performed using the standard protocol during bolus administration of intravenous contrast. Multiplanar CT image reconstructions and MIPs were obtained to evaluate the vascular anatomy. RADIATION DOSE REDUCTION: This exam was performed according to the departmental dose-optimization program which includes automated exposure control, adjustment of the mA and/or kV according to patient size and/or use of iterative reconstruction technique. CONTRAST:  19m OMNIPAQUE IOHEXOL 350 MG/ML SOLN COMPARISON:  Chest radiograph dated 03/19/2022. FINDINGS: Evaluation of this exam is limited  due to respiratory motion artifact. Cardiovascular: There is moderate cardiomegaly. No pericardial effusion. Three-vessel coronary vascular calcification. There is retrograde flow of contrast from the right atrium into the IVC suggestive of right heart dysfunction. There is moderate atherosclerotic calcification of the thoracic aorta. No aneurysmal dilatation. Evaluation of the pulmonary arteries is somewhat limited due to respiratory motion artifact and suboptimal visualization of the peripheral branches. No pulmonary artery embolus identified. Mediastinum/Nodes: No hilar or mediastinal adenopathy. Several small mediastinal  lymph nodes noted. The esophagus is grossly unremarkable. No mediastinal fluid collection. Lungs/Pleura: There is a moderate size right and small left pleural effusion. There are partial consolidative changes of the lower lobes as well as right middle lobe which may represent atelectasis or pneumonia. Underlying mass is not excluded clinical correlation and follow-up to resolution recommended. There is no pneumothorax. The central airways are patent. Upper Abdomen: No acute abnormality. Musculoskeletal: Osteopenia with degenerative changes of the spine. No acute osseous pathology. Review of the MIP images confirms the above findings. IMPRESSION: 1. No CT evidence of pulmonary artery embolus. 2. Moderate cardiomegaly with findings of right heart dysfunction. 3. Moderate right and small left pleural effusions with partial consolidative changes of the lower lobes as well as right middle lobe which may represent atelectasis or pneumonia. Clinical correlation and follow-up to resolution recommended. 4. Aortic Atherosclerosis (ICD10-I70.0). Electronically Signed   By: Anner Crete M.D.   On: 03/19/2022 23:06   DG CHEST PORT 1 VIEW  Result Date: 03/31/2022 CLINICAL DATA:  88 year old female with pleural effusion EXAM: PORTABLE CHEST 1 VIEW COMPARISON:  03/29/2022 FINDINGS: Cardiomediastinal silhouette unchanged with cardiomegaly. Opacities at the bilateral lung bases persists with obscuration of the bilateral hemidiaphragm and the heart borders. No pneumothorax. No interlobular septal thickening. IMPRESSION: Unchanged appearance of the chest x-ray, with opacities at the bilateral lung bases likely a combination of pleural effusion and atelectasis/consolidation. Cardiomegaly. Electronically Signed   By: Corrie Mckusick D.O.   On: 03/31/2022 12:04   DG CHEST PORT 1 VIEW  Result Date: 03/29/2022 CLINICAL DATA:  Shortness of breath. EXAM: PORTABLE CHEST 1 VIEW COMPARISON:  03/27/2022 FINDINGS: 0804 hours. The cardio  pericardial silhouette is enlarged. Bibasilar atelectasis/infiltrate is similar to prior with small bilateral pleural effusions. Telemetry leads overlie the chest. IMPRESSION: Stable exam. Bibasilar atelectasis/infiltrate with small bilateral pleural effusions. Electronically Signed   By: Misty Stanley M.D.   On: 03/29/2022 10:52   DG Chest Port 1 View  Result Date: 03/19/2022 CLINICAL DATA:  Shortness of breath EXAM: PORTABLE CHEST 1 VIEW COMPARISON:  Radiograph 03/17/2022 FINDINGS: Unchanged, enlarged cardiac silhouette. Aortic arch calcifications. There are bilateral pleural effusions, right greater than left, not significantly changed. Mild diffuse interstitial opacities. No pneumothorax. No acute osseous abnormality. Thoracic spondylosis. IMPRESSION: Mild pulmonary edema with small bilateral pleural effusions and adjacent basilar atelectasis. Unchanged cardiomegaly. Electronically Signed   By: Maurine Simmering M.D.   On: 03/19/2022 21:19   ECHOCARDIOGRAM COMPLETE  Result Date: 03/27/2022    ECHOCARDIOGRAM REPORT   [name] Date of Exam: 03/27/2022 Medical Rec #:  [id number]        Height:       65.0 in Accession #:    [id number]       Weight:       165.3 lb Date of Birth:  Nov 30, 1932        BSA:          1.824 m Patient Age:    88 years  BP:           117/73 mmHg Patient Gender: F                HR:           88 bpm. Exam Location:  Inpatient Procedure: 2D Echo Indications:    acute systolic chf  History:        Patient has prior history of Echocardiogram examinations, most                 recent 11/01/2020. CHF, chronic kidney disease,                 Arrythmias:Atrial Fibrillation; Risk Factors:Diabetes,                 Hypertension and Sleep Apnea.  Sonographer:    Johny Chess RDCS Referring Phys: [place]  1. Left ventricular ejection fraction, by estimation, is 50 to 55%. The left ventricle has low normal function. The left ventricle demonstrates  global hypokinesis. There is mild left ventricular hypertrophy. Left ventricular diastolic function could not  be evaluated. There is incoordinate septal motion.  2. Right ventricular systolic function is normal. The right ventricular size is normal. There is mildly elevated pulmonary artery systolic pressure. The estimated right ventricular systolic pressure is 82.4 mmHg.  3. Left atrial size was severely dilated.  4. Right atrial size was moderately dilated.  5. A small pericardial effusion is present. The pericardial effusion is posterior to the left ventricle.  6. The mitral valve is abnormal. Mild mitral valve regurgitation.  7. The aortic valve is tricuspid. Aortic valve regurgitation is not visualized. Aortic valve sclerosis is present, with no evidence of aortic valve stenosis.  8. The inferior vena cava is normal in size with greater than 50% respiratory variability, suggesting right atrial pressure of 3 mmHg. Comparison(s): No significant change from prior study. 11/01/2020: LVEF 50-55%. FINDINGS  Left Ventricle: Left ventricular ejection fraction, by estimation, is 50 to 55%. The left ventricle has low normal function. The left ventricle demonstrates global hypokinesis. The left ventricular internal cavity size was normal in size. There is mild left ventricular hypertrophy. Incoordinate septal motion. Left ventricular diastolic function could not be evaluated due to atrial fibrillation. Left ventricular diastolic function could not be evaluated. Right Ventricle: The right ventricular size is normal. No increase in right ventricular wall thickness. Right ventricular systolic function is normal. There is mildly elevated pulmonary artery systolic pressure. The tricuspid regurgitant velocity is 3.08  m/s, and with an assumed right atrial pressure of 3 mmHg, the estimated right ventricular systolic pressure is 23.5 mmHg. Left Atrium: Left atrial size was severely dilated. Right Atrium: Right atrial size was  moderately dilated. Pericardium: A small pericardial effusion is present. The pericardial effusion is posterior to the left ventricle. Mitral Valve: The mitral valve is abnormal. There is mild thickening of the anterior and posterior mitral valve leaflet(s). Mild to moderate mitral annular calcification. Mild mitral valve regurgitation. Tricuspid Valve: The tricuspid valve is grossly normal. Tricuspid valve regurgitation is mild. Aortic Valve: The aortic valve is tricuspid. Aortic valve regurgitation is not visualized. Aortic valve sclerosis is present, with no evidence of aortic valve stenosis. Pulmonic Valve: The pulmonic valve was normal in structure. Pulmonic valve regurgitation is not visualized. Aorta: The aortic root and ascending aorta are structurally normal, with no evidence of dilitation. Venous: The inferior vena cava is normal in size with greater than 50% respiratory variability, suggesting right atrial  pressure of 3 mmHg. IAS/Shunts: No atrial level shunt detected by color flow Doppler.  LEFT VENTRICLE PLAX 2D LVIDd:         4.70 cm LVIDs:         3.70 cm LV PW:         1.40 cm LV IVS:        1.20 cm LVOT diam:     2.20 cm LVOT Area:     3.80 cm  IVC IVC diam: 2.10 cm LEFT ATRIUM             Index        RIGHT ATRIUM           Index LA diam:        4.90 cm 2.69 cm/m   RA Area:     21.30 cm LA Vol (A2C):   91.1 ml 49.93 ml/m  RA Volume:   61.30 ml  33.60 ml/m LA Vol (A4C):   95.3 ml 52.23 ml/m LA Biplane Vol: 93.4 ml 51.19 ml/m   AORTA Ao Root diam: 3.20 cm Ao Asc diam:  3.70 cm TRICUSPID VALVE TR Peak grad:   37.9 mmHg TR Vmax:        308.00 cm/s  SHUNTS Systemic Diam: 2.20 cm Lyman Bishop MD Electronically signed by Lyman Bishop MD Signature Date/Time: 03/27/2022/3:46:29 PM    Final    IR US CHEST  Result Date: 04/01/2022 CLINICAL DATA:  Pleural effusion EXAM: CHEST ULTRASOUND COMPARISON:  None Available. FINDINGS: Focused sonographic exam of the bilateral chest demonstrates bilateral  small volume pleural effusions, insufficient for safe image guided thoracentesis. No intervention performed. IMPRESSION: Small bilateral pleural effusions.  No thoracentesis performed. Electronically Signed   By: Albin Felling M.D.   On: 04/01/2022 15:49    Microbiology: No results found for this or any previous visit (from the past 240 hour(s)).   Labs: Basic Metabolic Panel: Recent Labs  Lab 03/27/22 1400 03/28/22 0401 03/29/22 0427 03/30/22 0344 03/31/22 0349 04/01/22 0432 04/02/22 0121  NA  --    < > 138 138 138 138 136  K  --    < > 4.2 4.5 3.6 5.3* 4.1  CL  --    < > 101 100 97* 101 100  CO2  --    < > '29 29 31 25 28  '$ GLUCOSE  --    < > 105* 106* 104* 111* 90  BUN  --    < > '15 19 22 '$ 28* 23  CREATININE  --    < > 1.22* 1.32* 1.28* 1.33* 1.08*  CALCIUM  --    < > 8.8* 8.7* 8.8* 8.9 8.7*  MG 2.4  --   --   --   --  2.6*  --    < > = values in this interval not displayed.   Liver Function Tests: No results for input(s): AST, ALT, ALKPHOS, BILITOT, PROT, ALBUMIN in the last 168 hours. No results for input(s): LIPASE, AMYLASE in the last 168 hours. No results for input(s): AMMONIA in the last 168 hours. CBC: Recent Labs  Lab 03/27/22 0837 03/28/22 0401 03/29/22 0427 03/30/22 0344 03/31/22 0349 04/02/22 0121  WBC 7.9 6.9 8.3 7.7 7.4 7.2  NEUTROABS 5.3  --   --   --   --   --   HGB 9.3* 8.8* 10.0* 9.3* 9.3* 8.9*  HCT 31.6* 29.5* 32.5* 30.4* 30.5* 29.5*  MCV 91.1 90.8 89.0 90.5 88.4 89.1  PLT 267  233 251 224 233 213   Cardiac Enzymes: No results for input(s): CKTOTAL, CKMB, CKMBINDEX, TROPONINI in the last 168 hours. BNP: BNP (last 3 results) Recent Labs    03/17/22 1955 03/19/22 2108 03/27/22 0837  BNP 399.0* 327.1* 449.7*    ProBNP (last 3 results) No results for input(s): PROBNP in the last 8760 hours.  CBG: Recent Labs  Lab 04/01/22 1152 04/01/22 1633 04/01/22 2324 04/02/22 0620 04/02/22 1137  GLUCAP 120* 123* 122* 116* 179*        Signed:  Domenic Polite MD.  [facility] 04/02/2022, 3:52 PM

## 2022-04-02 NOTE — Progress Notes (Addendum)
Heart Failure Stewardship Pharmacist Progress Note ? ? ?PCP: Lin Landsman, MD ?PCP-Cardiologist: Jenkins Rouge, MD  ? ? ?HPI:  ?88 yo female with PMH of T2DM, HTN, OSA, permanent Afib (PTA Eliquis), chronic diastolic CHF, and gout.  Presented to ED with worsening dyspnea arrived on 2L [place] with O2 sat 89% and in Afib.  Patient endorsed orthopnea and denied weight gain or LEE outside of her baseline swelling.  Echo 05/11 with LVEF 50-55%, mild LVH, normal RV, mildly elevated pulmonary pressures, biatrial dilation, and mild MVR.  Compliant to PTA diuretic regimen.  Notable PTA medications include diltiazem 240 mg daily + PRN (required dose this past week).   ?Discharge delayed with need for ongoing diuresis and swallow study.  Found to have mild aspiration risk.  Planned for thoracentesis 05/15 but bilateral pleural effusions too small for intervention.  IV diuretics discontinued 05/16 as patient reported dizziness while sitting still, orthostatics with a 23 mmHg drop from lying to standing.  Still no diuretics. ? ?Current HF Medications: ?Diuretic: on hold with orthostasis  ?Beta Blocker: metoprolol succinate 50 mg daily ?SGLT2i: Jardiance 10 mg daily ? ?Prior to admission HF Medications: ?Diuretic: furosemide 40 mg daily ?Other: potassium 20 mEq daily ? ?Pertinent Lab Values: ?Serum creatinine 1.08 (down), BUN 23, Potassium 4.1, Sodium 136, BNP 449.7, A1c 6.1%  ? ?Vital Signs: ?Weight: 171 lbs (admission weight: 180.5 lbs) ?Blood pressure: 110-20s/60-70s ?Heart rate: 80-90s - in Afib ?I/O: -1.2L yesterday (IV diuretics held for orthostasis); net -8L ? ?Medication Assistance / Insurance Benefits Check: ?Does the patient have prescription insurance?  Yes ?Type of insurance plan: Medicare/Medicaid - Entresto/Jardiance copays $4.30 each ? ?Outpatient Pharmacy:  ?Prior to admission outpatient pharmacy: [facility] ?Is the patient willing to use [place] pharmacy at discharge? Yes ?Is the patient willing to transition their  outpatient pharmacy to utilize a [facility] outpatient pharmacy?   Yes ?  ? ?Assessment: ?1. Acute on chronic diastolic CHF (LVEF 36-64%). NYHA class III symptoms. ?Still with volume overload on chest ultrasound yesterday. Continued orthostasis limiting diuresis and addition/titration of GDMT. ?- Diuretics and spironolactone on hold with orthostasis.   ?- Continue metoprolol succinate 50 mg daily ?- Dizziness limiting use of ARB/ARNI ?- Continue Jardiance 10 mg daily ?  ?Plan: ?1) Medication changes recommended at this time: ?- Continue current regimen ?- Per previous discussion with Dr. Harrell Gave, VT on EKG likely an artifact. Will continue to monitor for NSVT with option to switch to metoprolol tartrate 25 mg BID if any additional episodes ?- Will need to reschedule TOC visit on 05/17 ? ?2) Patient assistance: ?- none ? ?3)  Education  ?- To be completed prior to discharge ?- Ensure PTA diltiazem discontinued at discharge ? ?Laurey Arrow, PharmD ?PGY1 Pharmacy Resident ?04/02/2022  7:13 AM ?

## 2022-04-02 NOTE — TOC Transition Note (Addendum)
Transition of Care (TOC) - CM/SW Discharge Note ? ? ?Patient Details  ?Name: Anne Roach ?MRN: [id number] ?Date of Birth: 1933-06-28 ? ?Transition of Care (TOC) CM/SW Contact:  ?Zenon Mayo, RN ?Phone Number: ?04/02/2022, 4:31 PM ? ? ?Clinical Narrative:    ?Patient is for dc today, NCM notified [place] with [employer]. Daughter will transport home. ? ? ?Final next level of care: [place] ?Barriers to Discharge: Continued Medical Work up ? ? ?Patient Goals and CMS Choice ?Patient states their goals for this hospitalization and ongoing recovery are:: return home ?CMS Medicare.gov Compare Post Acute Care list provided to:: Patient Represenative (must comment) ?Choice offered to / list presented to : Adult Children ? ?Discharge Placement ?  ?           ?  ?  ?  ?  ? ?Discharge Plan and Services ?In-house Referral: NA ?Discharge Planning Services: CM Consult ?Post Acute Care Choice: Home Health          ?  ?DME Agency: NA ?  ?  ?  ?HH Arranged: PT ?[place] Agency: [place] ?Date HH Agency Contacted: 04/01/22 ?Time Kief: [id number] ?Representative spoke with at [place]: Tommi Rumps ? ?Social Determinants of Health (SDOH) Interventions ?Food Insecurity Interventions: Intervention Not Indicated ?Financial Strain Interventions: Other (Comment), Intervention Not Indicated ?Housing Interventions: Intervention Not Indicated ?Transportation Interventions: Intervention Not Indicated ? ? ?Readmission Risk Interventions ?   ? View : No data to display.  ?  ?  ?  ? ? ? ? ? ?

## 2022-04-02 NOTE — Progress Notes (Signed)
Physical Therapy Treatment ?Patient Details ?Name: Anne Roach ?MRN: [id number] ?DOB: 1933/08/03 ?Today's Date: 04/02/2022 ? ? ?History of Present Illness Pt is an 88 y.o. female presenting 03/27/22 with increasing SOB. Of note, pt with x3 admissions for same complaint since 5/1. Chest X-ray shows bilateral pleural effusions; workup for acute on chronic CHF exacerbation. PMH includes afib, CHF, HTN, DM II. ?  ?PT Comments  ? ? Pt progressing with mobility. Today's session focused on transfer and gait training with rollator; pt moving well at supervision-level, demonstrates improved insight into activity tolerance limitations. Pt hopeful for d/c home today. If to remain admitted, will continue to follow acutely. ? ?SpO2 95% on RA, HR up to 121 with ambulation ?   ?Recommendations for follow up therapy are one component of a multi-disciplinary discharge planning process, led by the attending physician.  Recommendations may be updated based on patient status, additional functional criteria and insurance authorization. ? ?Follow Up Recommendations ? Home health PT [facility] aide; PACE program?) ?  ?  ?Assistance Recommended at Discharge Intermittent Supervision/Assistance  ?Patient can return home with the following A little help with bathing/dressing/bathroom;Assistance with cooking/housework;Direct supervision/assist for medications management;Direct supervision/assist for financial management;Assist for transportation;Help with stairs or ramp for entrance ?  ?Equipment Recommendations ? Rollator (4 wheels)  ?  ?Recommendations for Other Services   ? ? ?  ?Precautions / Restrictions Precautions ?Precautions: Fall ?Restrictions ?Weight Bearing Restrictions: No  ?  ? ?Mobility ? Bed Mobility ?Overal bed mobility: Modified Independent ?Bed Mobility: Supine to Sit ?  ?  ?  ?  ?  ?General bed mobility comments: HOB elevated ?  ? ?Transfers ?Overall transfer level: Needs assistance ?Equipment used: None, Rollator (4  wheels) ?Transfers: Sit to/from Stand ?Sit to Stand: Modified independent (Device/Increase time) ?  ?  ?  ?  ?  ?General transfer comment: able to stand from EOB, recliner and low toilet mod indep without DME; additional sit<>stand from rollator seat, cues for locking/unlocking rollator brakes (and backing up against wall for safety), supervision for safety/stability ?  ? ?Ambulation/Gait ?Ambulation/Gait assistance: Min guard, Supervision ?Gait Distance (Feet): 220 Feet (+ 180') ?Assistive device: None, Rollator (4 wheels) ?Gait Pattern/deviations: Step-through pattern, Decreased stride length, Trunk flexed ?Gait velocity: Decreased ?  ?  ?General Gait Details: initial ambulation to recliner without DME, pt reaching to furniture for added stability, min guard; additional in-room and hallway ambulation with rollator, supervision for safety, 1x seated rest break secondary to fatigue and c/o R knee pain ? ? ?Stairs ?  ?  ?  ?  ?  ? ? ?Wheelchair Mobility ?  ? ?Modified Rankin (Stroke Patients Only) ?  ? ? ?  ?Balance Overall balance assessment: Needs assistance ?Sitting-balance support: Feet supported ?Sitting balance-Leahy Scale: Good ?Sitting balance - Comments: indep with pericare seated on toilet; indep to readjust bilateral socks via figure four position ?  ?Standing balance support: No upper extremity supported, During functional activity ?Standing balance-Leahy Scale: Fair ?Standing balance comment: can ambulate and perform ADL tasks at sink without UE support ?  ?  ?  ?  ?  ?  ?  ?  ?  ?  ?  ?  ? ?  ?Cognition Arousal/Alertness: Awake/alert ?Behavior During Therapy: [facility] for tasks assessed/performed, Flat affect ?Overall Cognitive Status: No family/caregiver present to determine baseline cognitive functioning ?  ?  ?  ?  ?  ?  ?  ?  ?  ?  ?  ?  ?  ?  ?  ?  ?  General Comments: WFL for simple tasks; minimal responses, but pleasant and appropriate; pt did not requires cues to self-monitor activity tolerance this  session ?  ?  ? ?  ?Exercises   ? ?  ?General Comments General comments (skin integrity, edema, etc.): SpO2 95% on RA, HR 121 with ambulation. pt reports having rollator at home, endorses no DME needs; pt hopeful for d/c home today, daughter to take home ?  ?  ? ?Pertinent Vitals/Pain Pain Assessment ?Pain Assessment: Faces ?Faces Pain Scale: Hurts little more ?Pain Location: headache ("from the beeping noise"), R knee with ambulation ?Pain Descriptors / Indicators: Discomfort, Sore ?Pain Intervention(s): Monitored during session, Limited activity within patient's tolerance  ? ? ?Home Living   ?  ?  ?  ?  ?  ?  ?  ?  ?  ?   ?  ?Prior Function    ?  ?  ?   ? ?PT Goals (current goals can now be found in the care plan section) Progress towards PT goals: Progressing toward goals ? ?  ?Frequency ? ? ? Min 3X/week ? ? ? ?  ?PT Plan Current plan remains appropriate  ? ? ?Co-evaluation   ?  ?  ?  ?  ? ?  ?AM-PAC PT "6 Clicks" Mobility   ?Outcome Measure ? Help needed turning from your back to your side while in a flat bed without using bedrails?: None ?Help needed moving from lying on your back to sitting on the side of a flat bed without using bedrails?: None ?Help needed moving to and from a bed to a chair (including a wheelchair)?: None ?Help needed standing up from a chair using your arms (e.g., wheelchair or bedside chair)?: A Little ?Help needed to walk in hospital room?: A Little ?Help needed climbing 3-5 steps with a railing? : A Little ?6 Click Score: 21 ? ?  ?End of Session Equipment Utilized During Treatment: Gait belt ?Activity Tolerance: Patient tolerated treatment well ?Patient left: in chair;with call bell/phone within reach ?Nurse Communication: Mobility status ?PT Visit Diagnosis: Other abnormalities of gait and mobility (R26.89);Muscle weakness (generalized) (M62.81) ?  ? ? ?Time: 5284-1324 ?PT Time Calculation (min) (ACUTE ONLY): 24 min ? ?Charges:  $Gait Training: 8-22 mins ?$Therapeutic Activity: 8-22  mins          ?          ? ?Mabeline Caras, PT, DPT ?Acute Rehabilitation Services  ?Pager [phone] ?Office [phone] ? ?Derry Lory ?04/02/2022, 10:07 AM ? ?

## 2022-04-07 DIAGNOSIS — E1121 Type 2 diabetes mellitus with diabetic nephropathy: Secondary | ICD-10-CM | POA: Diagnosis not present

## 2022-04-07 DIAGNOSIS — R531 Weakness: Secondary | ICD-10-CM | POA: Diagnosis not present

## 2022-04-07 DIAGNOSIS — R5381 Other malaise: Secondary | ICD-10-CM | POA: Diagnosis not present

## 2022-04-07 DIAGNOSIS — N1831 Chronic kidney disease, stage 3a: Secondary | ICD-10-CM | POA: Diagnosis not present

## 2022-04-07 DIAGNOSIS — I4891 Unspecified atrial fibrillation: Secondary | ICD-10-CM | POA: Diagnosis not present

## 2022-04-07 DIAGNOSIS — I509 Heart failure, unspecified: Secondary | ICD-10-CM | POA: Diagnosis not present

## 2022-04-07 DIAGNOSIS — R0602 Shortness of breath: Secondary | ICD-10-CM | POA: Diagnosis not present

## 2022-04-09 ENCOUNTER — Ambulatory Visit (HOSPITAL_COMMUNITY)
Admission: RE | Admit: 2022-04-09 | Discharge: 2022-04-09 | Disposition: A | Discharge status: Home / Self Care | Payer: Medicare HMO | Source: Ambulatory Visit | Attending: Adult Health | Admitting: Adult Health

## 2022-04-09 ENCOUNTER — Other Ambulatory Visit (HOSPITAL_COMMUNITY): Payer: Self-pay | Admitting: *Deleted

## 2022-04-09 ENCOUNTER — Encounter (HOSPITAL_COMMUNITY): Payer: Self-pay

## 2022-04-09 ENCOUNTER — Telehealth (HOSPITAL_COMMUNITY): Payer: Self-pay | Admitting: *Deleted

## 2022-04-09 VITALS — BP 150/80 | HR 77 | Wt 174.4 lb

## 2022-04-09 DIAGNOSIS — I110 Hypertensive heart disease with heart failure: Secondary | ICD-10-CM | POA: Diagnosis not present

## 2022-04-09 DIAGNOSIS — D508 Other iron deficiency anemias: Secondary | ICD-10-CM | POA: Diagnosis not present

## 2022-04-09 DIAGNOSIS — Z7984 Long term (current) use of oral hypoglycemic drugs: Secondary | ICD-10-CM | POA: Insufficient documentation

## 2022-04-09 DIAGNOSIS — I5032 Chronic diastolic (congestive) heart failure: Secondary | ICD-10-CM | POA: Insufficient documentation

## 2022-04-09 DIAGNOSIS — I10 Essential (primary) hypertension: Secondary | ICD-10-CM | POA: Diagnosis not present

## 2022-04-09 DIAGNOSIS — E119 Type 2 diabetes mellitus without complications: Secondary | ICD-10-CM | POA: Diagnosis not present

## 2022-04-09 DIAGNOSIS — I4821 Permanent atrial fibrillation: Secondary | ICD-10-CM | POA: Diagnosis not present

## 2022-04-09 DIAGNOSIS — Z79899 Other long term (current) drug therapy: Secondary | ICD-10-CM | POA: Diagnosis not present

## 2022-04-09 DIAGNOSIS — Z7901 Long term (current) use of anticoagulants: Secondary | ICD-10-CM | POA: Insufficient documentation

## 2022-04-09 LAB — BASIC METABOLIC PANEL
Anion gap: 8 (ref 5–15)
BUN: 17 mg/dL (ref 8–23)
CO2: 24 mmol/L (ref 22–32)
Calcium: 8.7 mg/dL — ABNORMAL LOW (ref 8.9–10.3)
Chloride: 108 mmol/L (ref 98–111)
Creatinine, Ser: 1.18 mg/dL — ABNORMAL HIGH (ref 0.44–1.00)
GFR, Estimated: 45 mL/min — ABNORMAL LOW (ref >60)
Glucose, Bld: 100 mg/dL — ABNORMAL HIGH (ref 70–99)
Potassium: 4 mmol/L (ref 3.5–5.1)
Sodium: 140 mmol/L (ref 135–145)

## 2022-04-09 LAB — CBC
HCT: 33.8 % — ABNORMAL LOW (ref 36.0–46.0)
Hemoglobin: 10 g/dL — ABNORMAL LOW (ref 12.0–15.0)
MCH: 27 pg (ref 26.0–34.0)
MCHC: 29.6 g/dL — ABNORMAL LOW (ref 30.0–36.0)
MCV: 91.4 fL (ref 80.0–100.0)
Platelets: 250 10*3/uL (ref 150–400)
RBC: 3.7 MIL/uL — ABNORMAL LOW (ref 3.87–5.11)
RDW: 19.3 % — ABNORMAL HIGH (ref 11.5–15.5)
WBC: 6.4 10*3/uL (ref 4.0–10.5)
nRBC: 0 % (ref 0.0–0.2)

## 2022-04-09 LAB — BRAIN NATRIURETIC PEPTIDE: B Natriuretic Peptide: 1111.2 pg/mL — ABNORMAL HIGH (ref 0.0–100.0)

## 2022-04-09 MED ORDER — FUROSEMIDE 40 MG PO TABS: 40.0000 mg | ORAL_TABLET | Freq: Every day | ORAL | 0 refills | Status: DC

## 2022-04-09 MED ORDER — FUROSEMIDE 40 MG PO TABS: 40.0000 mg | ORAL_TABLET | Freq: Every day | ORAL | Status: DC

## 2022-04-09 MED ORDER — POTASSIUM CHLORIDE ER 20 MEQ PO TBCR: 20.0000 meq | EXTENDED_RELEASE_TABLET | Freq: Every day | ORAL | Status: AC

## 2022-04-09 NOTE — Progress Notes (Signed)
HEART & VASCULAR TRANSITION OF CARE CONSULT NOTE     Referring Physician: Dr Broadus John Primary Care: Dr Pearlie Oyster Primary Cardiologist:Dr Johnsie Cancel   HPI: Referred to clinic by Dr Broadus John for heart failure consultation.     Ms Engelstad is a 88 y.o. female with hypertension, diabetes, permanent atrial fibrillation, and HFpEF.   Admitted 03/27/2022 for acute on chronic diastolic heart failure. Of note, this is her third ER visit since 03/27/22 for dyspnea. Diuresed with IV lasix and transitioned to lasix as needed. Discharged on 04/02/22. D/C weight 171 pounds.   Last night her daughter called EMS called for increased dyspnea. Ms Wengert refused transport to the hospital. She took lasix 20 mg po earlier today. Earlier today her daughter called EMS called for increased dyspnea. She refused transport to the hospital.   Today she presents to [facility] with her daughter for  HF follow up.Overall feeling fair. Gradually feeling worse since discharge.SOB with exertion. + Orthopnea. Denies Appetite ok. No fever or chills. She has not been weighing at home. Taking all medications. Her daughter lives with her Beatris Ship, Emmie Niemann, Thurs.  She has a nephew that lives with her.  She has an Engineer, production.   Cardiac Testing  Echo 03/2022 -EF 50-55%  RV normal  LA severely dilated. RA moderately dilated. Small pericardiac effusion.  Echo 10/2020- EF 50-55% RV moderately reduced. LA severely dilated RA mildly dilated.   Review of Systems: [y] = yes, '[ ]'$  = no   General: Weight gain '[ ]'$ ; Weight loss '[ ]'$ ; Anorexia '[ ]'$ ; Fatigue [ Y]; Fever '[ ]'$ ; Chills '[ ]'$ ; Weakness '[ ]'$   Cardiac: Chest pain/pressure '[ ]'$ ; Resting SOB '[ ]'$ ; Exertional SOB [ Y]; Orthopnea [Y ]; Pedal Edema '[ ]'$ ; Palpitations '[ ]'$ ; Syncope '[ ]'$ ; Presyncope '[ ]'$ ; Paroxysmal nocturnal dyspnea'[ ]'$   Pulmonary: Cough '[ ]'$ ; Wheezing'[ ]'$ ; Hemoptysis'[ ]'$ ; Sputum '[ ]'$ ; Snoring '[ ]'$   GI: Vomiting'[ ]'$ ; Dysphagia'[ ]'$ ; Melena'[ ]'$ ; Hematochezia '[ ]'$ ; Heartburn'[ ]'$ ; Abdominal pain '[ ]'$ ; Constipation '[ ]'$ ; Diarrhea [  ]; BRBPR '[ ]'$   GU: Hematuria'[ ]'$ ; Dysuria '[ ]'$ ; Nocturia'[ ]'$   Vascular: Pain in legs with walking '[ ]'$ ; Pain in feet with lying flat '[ ]'$ ; Non-healing sores '[ ]'$ ; Stroke '[ ]'$ ; TIA '[ ]'$ ; Slurred speech '[ ]'$ ;  Neuro: Headaches'[ ]'$ ; Vertigo'[ ]'$ ; Seizures'[ ]'$ ; Paresthesias'[ ]'$ ;Blurred vision '[ ]'$ ; Diplopia '[ ]'$ ; Vision changes '[ ]'$   Ortho/Skin: Arthritis '[ ]'$ ; Joint pain [Y ]; Muscle pain '[ ]'$ ; Joint swelling '[ ]'$ ; Back Pain [ Y]; Rash '[ ]'$   Psych: Depression'[ ]'$ ; Anxiety'[ ]'$   Heme: Bleeding problems '[ ]'$ ; Clotting disorders '[ ]'$ ; Anemia [Y ]  Endocrine: Diabetes [ Y]; Thyroid dysfunction'[ ]'$    Past Medical History:  Diagnosis Date   Atrial fibrillation (HCC)    Chronic diastolic CHF (congestive heart failure) ([place]) 10/31/2020   DM (diabetes mellitus) (HCC)    HTN (hypertension)    Tremor     Current Outpatient Medications  Medication Sig Dispense Refill   ACCU-CHEK AVIVA PLUS test strip      acetaminophen (TYLENOL) 500 MG tablet Take 1,000 mg by mouth every 6 (six) hours as needed for mild pain or headache.     allopurinol (ZYLOPRIM) 100 MG tablet TAKE 1 TABLET(100 MG) BY MOUTH DAILY 90 tablet 2   apixaban (ELIQUIS) 5 MG TABS tablet TAKE 1 TABLET(5 MG) BY MOUTH TWICE DAILY 60 tablet 5   cholecalciferol (VITAMIN D3) 25 MCG (1000 UNIT) tablet Take  1,000 Units by mouth daily with breakfast.     diclofenac Sodium (VOLTAREN) 1 % GEL Apply 2 g topically 4 (four) times daily. 150 g 0   empagliflozin (JARDIANCE) 10 MG TABS tablet Take 1 tablet (10 mg total) by mouth daily. 30 tablet 0   ferrous sulfate 325 (65 FE) MG tablet Take 1 tablet (325 mg total) by mouth daily. 30 tablet 0   folic acid (FOLVITE) 026 MCG tablet Take 800 mcg by mouth daily.     furosemide (LASIX) 40 MG tablet Take 1 tablet (40 mg total) by mouth as needed. For weight gain, 2-3 pounds in 1 day or 5 pounds in 1 week     gabapentin (NEURONTIN) 400 MG capsule Take 400 mg by mouth 3 (three) times daily.     Ginkgo Biloba (GINKOBA PO) Take 120 mg by mouth  daily.     MAGNESIUM PO Take 1 tablet by mouth daily with lunch.     metFORMIN (GLUCOPHAGE-XR) 750 MG 24 hr tablet Take 750 mg by mouth at bedtime.  2   metoprolol succinate (TOPROL-XL) 50 MG 24 hr tablet Take 1 tablet (50 mg total) by mouth daily. Take with or immediately following a meal. 30 tablet 0   Multiple Vitamins-Minerals (ICAPS AREDS 2 PO) Take 1 capsule by mouth 2 (two) times daily.     Potassium Chloride ER 20 MEQ TBCR Take 20 mEq by mouth as needed. On the days you take lasix     traMADol (ULTRAM) 50 MG tablet Take 1 tablet (50 mg total) by mouth every 12 (twelve) hours as needed.     Zinc 50 MG CAPS Take 1 capsule by mouth daily.     Current Facility-Administered Medications  Medication Dose Route Frequency Provider Last Rate Last Admin   betamethasone acetate-betamethasone sodium phosphate (CELESTONE) injection 3 mg  3 mg Intramuscular Once Edrick Kins, DPM        No Known Allergies    Social History   Socioeconomic History   Marital status: Widowed    Spouse name: Not on file   Number of children: 2   Years of education: 12   Highest education level: High school graduate  Occupational History    Comment: Retired    Comment: retired  Tobacco Use   Smoking status: Never   Smokeless tobacco: Never  Vaping Use   Vaping Use: Never used  Substance and Sexual Activity   Alcohol use: No    Alcohol/week: 0.0 standard drinks   Drug use: No   Sexual activity: Not on file  Other Topics Concern   Not on file  Social History Narrative   Speaks English and Arabic. Patient lives at home with her daughter [name]). Patient is widowed.   Patient is retired.   Education high school.   Right handed.   Two children.   Caffeine None   Social Determinants of Health   Financial Resource Strain: Low Risk    Difficulty of Paying Living Expenses: Not hard at all  Food Insecurity: No Food Insecurity   Worried About Charity fundraiser in the Last Year: Never true    Arboriculturist in the Last Year: Never true  Transportation Needs: No Transportation Needs   Lack of Transportation (Medical): No   Lack of Transportation (Non-Medical): No  Physical Activity: Not on file  Stress: Not on file  Social Connections: Not on file  Intimate Partner Violence: Not on file  Family History  Problem Relation Age of Onset   Diabetes Father    Asthma Brother     Vitals:   04/09/22 1124  BP: (!) 150/80  Pulse: 77  SpO2: 94%  Weight: 79.1 kg (174 lb 6.4 oz)   Wt Readings from Last 3 Encounters:  04/09/22 79.1 kg (174 lb 6.4 oz)  04/02/22 77.6 kg (171 lb 1.2 oz)  02/19/21 73.5 kg (162 lb)    PHYSICAL EXAM: General:  Arrived  in a wheel chair.  HEENT: normal Neck: supple. JVP 10-11. Carotids 2+ bilat; no bruits. No lymphadenopathy or thryomegaly appreciated. Cor: PMI nondisplaced. Irregular rate & rhythm. No rubs, gallops or murmurs. Lungs: clear Abdomen: soft, nontender, nondistended. No hepatosplenomegaly. No bruits or masses. Good bowel sounds. Extremities: no cyanosis, clubbing, rash, R and LLE 1-2+ edema Neuro: alert & oriented x 3, cranial nerves grossly intact. moves all 4 extremities w/o difficulty. Affect pleasant.  ECG: Afib 74 bpm    ASSESSMENT & PLAN: 1. HFpEF  03/2022 Echo EF 50-55% RV  NYHA III. Reds Clip 34%. Volume status elevated.  FUROSCIX prescribed  Patient viewed patient education video with QR code for Caren Griffins code for [place] placed on AVS  Call [facility] at [phone] for questions regarding on body infuser. Day 1  FUROSCIX 80 mg once daily  via on body infuser + KDUR 40  On 5/25 she will start lasix 40 mg po daily + 20 meq potassium   GDMT  Diuretic- She needs scheduled diuretics. See above. I would not use as needed diuretics.  BB-Continue metoprolol 50 mg daily  Ace/ARB/ARNI- Consider next visit.  MRA- Consider next visit  SGLT2i- Continue jardiance 10 mg daily  Discussed daily weight.  Provided with weight chart.   2. Chronic A fib  -Rate controlled  -On eliquis 5 mg twice a day -Check CBC   3. H/O Hyperkalemia  Check bMET   4. Anemia  Check CBC and anemia panel.   Check CBC BMET and anemia panel.   Referred to HFSW (PCP, Medications, Transportation, ETOH Abuse, Drug Abuse, Insurance, Financial ): No Refer to Pharmacy: No Refer to Home Health:  No Refer to Advanced Heart Failure Clinic: No  Refer to General Cardiology: Existing patient.   Follow up next week to reassess volume status.   Quillan Whitter NP-C  12:58 PM

## 2022-04-09 NOTE — Progress Notes (Signed)
Medication Samples have been provided to the patient.  Drug name: Furoscix       Strength: '80mg'$         Qty: 1  LOT: [id number]  Exp.Date: 04/03/23  Dosing instructions: Use 1 Furoscix SQ today  The patient has been instructed regarding the correct time, dose, and frequency of taking this medication, including desired effects and most common side effects.   Olie Scaffidi 12:07 PM 04/09/2022

## 2022-04-09 NOTE — Progress Notes (Signed)
ReDS Vest / Clip - 04/09/22 1100       ReDS Vest / Clip   Station Marker C    Ruler Value 28.5    ReDS Value Range Low volume    ReDS Actual Value 34

## 2022-04-09 NOTE — Progress Notes (Signed)
Furoscix device placed on patient '@12'$ :15 Instructions reviewed with patient and daughter

## 2022-04-09 NOTE — Patient Instructions (Addendum)
Good to see you today!  Lab work drawn today,we will call with any abnormal results  Your provider has order Furoscix for you. This is an on-body infuser that gives you a dose of Furosemide.   We have placed the device on you today, it will run for 5 hours (end around 5:15 PM)  Take Potassium (klor-con) 20 meq when you get home today  For questions or issues regarding the device call Furoscix Direct at [phone]   STARTING Friday 04/10/22 take Furosemide 40 mg Daily and Potassium (klor-con) 20 meq Daily  Do the following things EVERYDAY: Weigh yourself in the morning before breakfast. Write it down and keep it in a log. Take your medicines as prescribed Eat low salt foods--Limit salt (sodium) to 2000 mg per day.  Stay as active as you can everyday Limit all fluids for the day to less than 2 liters   Thank you for allowing [place] to provider your heart failure care after your recent hospitalization. Please follow-up with [place] again in 1 week (Thursday 04/17/22 at 10 am)

## 2022-04-09 NOTE — Telephone Encounter (Signed)
Called to confirm Heart & Vascular Transitions of Care appointment at 3 pm on 04/09/22. Patient reminded to bring all medications and pill box organizer with them. Confirmed patient has transportation. Gave directions, instructed to utilize [place] parking.  Confirmed appointment prior to ending call.   Earnestine Leys, BSN, Clinical cytogeneticist Only

## 2022-04-16 DIAGNOSIS — H353211 Exudative age-related macular degeneration, right eye, with active choroidal neovascularization: Secondary | ICD-10-CM | POA: Diagnosis not present

## 2022-04-17 ENCOUNTER — Telehealth (HOSPITAL_COMMUNITY): Payer: Self-pay | Admitting: *Deleted

## 2022-04-17 ENCOUNTER — Encounter (HOSPITAL_COMMUNITY): Payer: Self-pay

## 2022-04-17 ENCOUNTER — Ambulatory Visit (HOSPITAL_COMMUNITY)
Admission: RE | Admit: 2022-04-17 | Discharge: 2022-04-17 | Disposition: A | Discharge status: Home / Self Care | Payer: Medicare HMO | Source: Ambulatory Visit | Attending: Cardiology | Admitting: Cardiology

## 2022-04-17 VITALS — BP 110/68 | HR 72 | Wt 169.4 lb

## 2022-04-17 DIAGNOSIS — R0609 Other forms of dyspnea: Secondary | ICD-10-CM | POA: Insufficient documentation

## 2022-04-17 DIAGNOSIS — I110 Hypertensive heart disease with heart failure: Secondary | ICD-10-CM | POA: Insufficient documentation

## 2022-04-17 DIAGNOSIS — Z7901 Long term (current) use of anticoagulants: Secondary | ICD-10-CM | POA: Diagnosis not present

## 2022-04-17 DIAGNOSIS — Z713 Dietary counseling and surveillance: Secondary | ICD-10-CM | POA: Diagnosis not present

## 2022-04-17 DIAGNOSIS — I4821 Permanent atrial fibrillation: Secondary | ICD-10-CM | POA: Diagnosis not present

## 2022-04-17 DIAGNOSIS — I5032 Chronic diastolic (congestive) heart failure: Secondary | ICD-10-CM | POA: Insufficient documentation

## 2022-04-17 DIAGNOSIS — Z7984 Long term (current) use of oral hypoglycemic drugs: Secondary | ICD-10-CM | POA: Insufficient documentation

## 2022-04-17 DIAGNOSIS — Z79899 Other long term (current) drug therapy: Secondary | ICD-10-CM | POA: Diagnosis not present

## 2022-04-17 DIAGNOSIS — E119 Type 2 diabetes mellitus without complications: Secondary | ICD-10-CM | POA: Insufficient documentation

## 2022-04-17 LAB — BASIC METABOLIC PANEL
Anion gap: 9 (ref 5–15)
BUN: 17 mg/dL (ref 8–23)
CO2: 26 mmol/L (ref 22–32)
Calcium: 9.3 mg/dL (ref 8.9–10.3)
Chloride: 105 mmol/L (ref 98–111)
Creatinine, Ser: 1.17 mg/dL — ABNORMAL HIGH (ref 0.44–1.00)
GFR, Estimated: 45 mL/min — ABNORMAL LOW (ref >60)
Glucose, Bld: 149 mg/dL — ABNORMAL HIGH (ref 70–99)
Potassium: 3.8 mmol/L (ref 3.5–5.1)
Sodium: 140 mmol/L (ref 135–145)

## 2022-04-17 LAB — BRAIN NATRIURETIC PEPTIDE: B Natriuretic Peptide: 472.9 pg/mL — ABNORMAL HIGH (ref 0.0–100.0)

## 2022-04-17 NOTE — Progress Notes (Addendum)
HEART & VASCULAR TRANSITION OF CARE PROGRESS NOTE     Referring Physician: Dr Broadus John Primary Care: Dr Pearlie Oyster Primary Cardiologist:Dr Johnsie Cancel   HPI: Referred to clinic by Dr Broadus John for heart failure consultation.     [name] is a 88 y.o. female with hypertension, diabetes, permanent atrial fibrillation, and HFpEF.   Admitted 03/27/2022 for acute on chronic diastolic heart failure. Of note, this is her third ER visit since 03/27/22 for dyspnea. Diuresed with IV lasix and transitioned to lasix as needed. Discharged on 04/02/22. D/C weight 171 pounds.   5/23 her daughter called EMS for increased dyspnea. [name] refused transport to the hospital.   Had initial [facility] clinic f/u on 5/24. Gradually feeling worse since discharge. SOB with exertion. + Orthopnea. Noted to be fluid overloaded and prescribed FUROSCIX 80 mg x 1 w/ instruction to transition back to PO Lasix on 5/25. Frequency of Lasix change from PRN to daily.   She presents back today for f/u. Here w/ her daughter. Wt down 5 lb. ReDs clip 35%. Denies resting dyspnea but c/w exertional dyspnea. NYHA Class III. SOB ambulating around her home. Denies orthopnea/PND. BP 110/68.  We discussed addition of low dose Entresto but pt's daughter resistant. Concerned about side effects and risk for hypotension.   She has f/u w/ her nephrologist next week.     Cardiac Testing  Echo 03/2022 -EF 50-55%  RV normal  LA severely dilated. RA moderately dilated. Small pericardiac effusion.  Echo 10/2020- EF 50-55% RV moderately reduced. LA severely dilated RA mildly dilated.   Review of Systems: [y] = yes, '[ ]'$  = no   General: Weight gain '[ ]'$ ; Weight loss '[ ]'$ ; Anorexia '[ ]'$ ; Fatigue '[ ]'$ ; Fever '[ ]'$ ; Chills '[ ]'$ ; Weakness '[ ]'$   Cardiac: Chest pain/pressure '[ ]'$ ; Resting SOB '[ ]'$ ; Exertional SOB [ Y]; Orthopnea '[ ]'$ ; Pedal Edema '[ ]'$ ; Palpitations '[ ]'$ ; Syncope '[ ]'$ ; Presyncope '[ ]'$ ; Paroxysmal nocturnal dyspnea'[ ]'$   Pulmonary: Cough '[ ]'$ ; Wheezing'[ ]'$ ;  Hemoptysis'[ ]'$ ; Sputum '[ ]'$ ; Snoring '[ ]'$   GI: Vomiting'[ ]'$ ; Dysphagia'[ ]'$ ; Melena'[ ]'$ ; Hematochezia '[ ]'$ ; Heartburn'[ ]'$ ; Abdominal pain '[ ]'$ ; Constipation '[ ]'$ ; Diarrhea '[ ]'$ ; BRBPR '[ ]'$   GU: Hematuria'[ ]'$ ; Dysuria '[ ]'$ ; Nocturia'[ ]'$   Vascular: Pain in legs with walking '[ ]'$ ; Pain in feet with lying flat '[ ]'$ ; Non-healing sores '[ ]'$ ; Stroke '[ ]'$ ; TIA '[ ]'$ ; Slurred speech '[ ]'$ ;  Neuro: Headaches'[ ]'$ ; Vertigo'[ ]'$ ; Seizures'[ ]'$ ; Paresthesias'[ ]'$ ;Blurred vision '[ ]'$ ; Diplopia '[ ]'$ ; Vision changes '[ ]'$   Ortho/Skin: Arthritis '[ ]'$ ; Joint pain [Y ]; Muscle pain '[ ]'$ ; Joint swelling '[ ]'$ ; Back Pain [ Y]; Rash '[ ]'$   Psych: Depression'[ ]'$ ; Anxiety'[ ]'$   Heme: Bleeding problems '[ ]'$ ; Clotting disorders '[ ]'$ ; Anemia '[ ]'$   Endocrine: Diabetes [ Y]; Thyroid dysfunction'[ ]'$    Past Medical History:  Diagnosis Date   Atrial fibrillation (HCC)    Chronic diastolic CHF (congestive heart failure) ([place]) 10/31/2020   DM (diabetes mellitus) (HCC)    HTN (hypertension)    Tremor     Current Outpatient Medications  Medication Sig Dispense Refill   ACCU-CHEK AVIVA PLUS test strip      acetaminophen (TYLENOL) 500 MG tablet Take 1,000 mg by mouth every 6 (six) hours as needed for mild pain or headache.     allopurinol (ZYLOPRIM) 100 MG tablet TAKE 1 TABLET(100 MG) BY MOUTH DAILY 90 tablet 2  apixaban (ELIQUIS) 5 MG TABS tablet TAKE 1 TABLET(5 MG) BY MOUTH TWICE DAILY 60 tablet 5   cholecalciferol (VITAMIN D3) 25 MCG (1000 UNIT) tablet Take 1,000 Units by mouth daily with breakfast.     diclofenac Sodium (VOLTAREN) 1 % GEL Apply 2 g topically 4 (four) times daily. 150 g 0   empagliflozin (JARDIANCE) 10 MG TABS tablet Take 1 tablet (10 mg total) by mouth daily. 30 tablet 0   ferrous sulfate 325 (65 FE) MG tablet Take 1 tablet (325 mg total) by mouth daily. 30 tablet 0   folic acid (FOLVITE) 381 MCG tablet Take 800 mcg by mouth daily.     furosemide (LASIX) 40 MG tablet Take 1 tablet (40 mg total) by mouth daily. 30 tablet 0   gabapentin (NEURONTIN)  400 MG capsule Take 400 mg by mouth 3 (three) times daily.     Ginkgo Biloba (GINKOBA PO) Take 120 mg by mouth daily.     MAGNESIUM PO Take 1 tablet by mouth daily with lunch.     metFORMIN (GLUCOPHAGE-XR) 750 MG 24 hr tablet Take 750 mg by mouth at bedtime.  2   metoprolol succinate (TOPROL-XL) 50 MG 24 hr tablet Take 1 tablet (50 mg total) by mouth daily. Take with or immediately following a meal. 30 tablet 0   Multiple Vitamins-Minerals (ICAPS AREDS 2 PO) Take 1 capsule by mouth 2 (two) times daily.     Potassium Chloride ER 20 MEQ TBCR Take 20 mEq by mouth daily. 60 tablet    traMADol (ULTRAM) 50 MG tablet Take 1 tablet (50 mg total) by mouth every 12 (twelve) hours as needed.     Zinc 50 MG CAPS Take 1 capsule by mouth daily.     Current Facility-Administered Medications  Medication Dose Route Frequency Provider Last Rate Last Admin   betamethasone acetate-betamethasone sodium phosphate (CELESTONE) injection 3 mg  3 mg Intramuscular Once Edrick Kins, DPM        No Known Allergies    Social History   Socioeconomic History   Marital status: Widowed    Spouse name: Not on file   Number of children: 2   Years of education: 12   Highest education level: High school graduate  Occupational History    Comment: Retired    Comment: retired  Tobacco Use   Smoking status: Never   Smokeless tobacco: Never  Vaping Use   Vaping Use: Never used  Substance and Sexual Activity   Alcohol use: No    Alcohol/week: 0.0 standard drinks   Drug use: No   Sexual activity: Not on file  Other Topics Concern   Not on file  Social History Narrative   Speaks English and Arabic. Patient lives at home with her daughter Anne Roach). Patient is widowed.   Patient is retired.   Education high school.   Right handed.   Two children.   Caffeine None   Social Determinants of Health   Financial Resource Strain: Low Risk    Difficulty of Paying Living Expenses: Not hard at all  Food Insecurity:  No Food Insecurity   Worried About Charity fundraiser in the Last Year: Never true   Arboriculturist in the Last Year: Never true  Transportation Needs: No Transportation Needs   Lack of Transportation (Medical): No   Lack of Transportation (Non-Medical): No  Physical Activity: Not on file  Stress: Not on file  Social Connections: Not on file  Intimate Partner Violence:  Not on file      Family History  Problem Relation Age of Onset   Diabetes Father    Asthma Brother     Vitals:   04/17/22 1008  BP: 110/68  Pulse: 72  SpO2: 98%  Weight: 76.8 kg (169 lb 6.4 oz)    Wt Readings from Last 3 Encounters:  04/17/22 76.8 kg (169 lb 6.4 oz)  04/09/22 79.1 kg (174 lb 6.4 oz)  04/02/22 77.6 kg (171 lb 1.2 oz)    PHYSICAL EXAM: ReDs clip 35%  General:  Well appearing elderly female in [place]. No respiratory difficulty HEENT: normal Neck: supple. no JVD. Carotids 2+ bilat; no bruits. No lymphadenopathy or thyromegaly appreciated. Cor: PMI nondisplaced. Regular rate & rhythm. No rubs, gallops or murmurs. Lungs: clear Abdomen: soft, nontender, nondistended. No hepatosplenomegaly. No bruits or masses. Good bowel sounds. Extremities: no cyanosis, clubbing, rash, edema Neuro: alert & oriented x 3, cranial nerves grossly intact. moves all 4 extremities w/o difficulty. Affect pleasant.   ECG: Not performed    ASSESSMENT & PLAN:  1. HFpEF  - Echo 5/23 EF 50-55% RV normal  - Wt down 5 lb w/ Furoscix. Grossly euvolemic on exam but c/w NYHA Class III symptoms. ReDs borderline at 35%. - recommended addition of low dose Entresto but pt refused - repeat BNP and check f/u BMP today  - If BNP still elevated, will titrate Lasix to 60 mg daily vs addition of low dose spiro pending Scr/K  - continue Jardiance 10 mg daily  - continue Toprol XL 50 mg daily  - advised low sodium diet and fluid restriction + daily wts - f/u w/ cardiology    2. Chronic A fib  -Rate controlled  -On eliquis 5  mg twice a day  3. H/O Hyperkalemia  - Check BMET    Referred to HFSW (PCP, Medications, Transportation, ETOH Abuse, Drug Abuse, Insurance, Financial ): No Refer to Pharmacy: No Refer to Home Health:  No Refer to Advanced Heart Failure Clinic: No  Refer to General Cardiology: Existing patient. Keep f/u w/ Dr. Johnsie Cancel in 2 wks   Follow up w/ Dr. Johnsie Cancel in 2 wks   Endora Teresi PA-C  10:12 AM

## 2022-04-17 NOTE — Telephone Encounter (Signed)
Called to confirm Heart & Vascular Transitions of Care appointment at 10 am on 04/17/22. Patient reminded to bring all medications and pill box organizer with them. Confirmed patient has transportation. Gave directions, instructed to utilize [place] parking.  Confirmed appointment prior to ending call.  Confirmed with patients daughter.   Earnestine Leys, BSN, Clinical cytogeneticist Only

## 2022-04-17 NOTE — Patient Instructions (Addendum)
RedsClip done today.  Labs done today. We will contact you only if your labs are abnormal.  No medication changes were made. Please continue all current medications as prescribed.  Thank you for allowing [place] to provide your heart failure care after your recent hospitalization.

## 2022-04-18 ENCOUNTER — Other Ambulatory Visit: Payer: Self-pay | Admitting: Cardiovascular Disease

## 2022-04-21 DIAGNOSIS — M79642 Pain in left hand: Secondary | ICD-10-CM | POA: Diagnosis not present

## 2022-04-21 DIAGNOSIS — M79641 Pain in right hand: Secondary | ICD-10-CM | POA: Diagnosis not present

## 2022-04-21 DIAGNOSIS — M65311 Trigger thumb, right thumb: Secondary | ICD-10-CM | POA: Diagnosis not present

## 2022-04-23 DIAGNOSIS — I5032 Chronic diastolic (congestive) heart failure: Secondary | ICD-10-CM | POA: Diagnosis not present

## 2022-04-23 DIAGNOSIS — N390 Urinary tract infection, site not specified: Secondary | ICD-10-CM | POA: Diagnosis not present

## 2022-04-23 DIAGNOSIS — M1000 Idiopathic gout, unspecified site: Secondary | ICD-10-CM | POA: Diagnosis not present

## 2022-04-23 DIAGNOSIS — I129 Hypertensive chronic kidney disease with stage 1 through stage 4 chronic kidney disease, or unspecified chronic kidney disease: Secondary | ICD-10-CM | POA: Diagnosis not present

## 2022-04-23 DIAGNOSIS — N1831 Chronic kidney disease, stage 3a: Secondary | ICD-10-CM | POA: Diagnosis not present

## 2022-04-23 DIAGNOSIS — N1832 Chronic kidney disease, stage 3b: Secondary | ICD-10-CM | POA: Diagnosis not present

## 2022-04-23 DIAGNOSIS — E1122 Type 2 diabetes mellitus with diabetic chronic kidney disease: Secondary | ICD-10-CM | POA: Diagnosis not present

## 2022-04-23 DIAGNOSIS — I480 Paroxysmal atrial fibrillation: Secondary | ICD-10-CM | POA: Diagnosis not present

## 2022-04-28 ENCOUNTER — Encounter (HOSPITAL_COMMUNITY): Payer: Self-pay

## 2022-04-28 NOTE — Progress Notes (Signed)
Primary Care: Dr Pearlie Oyster Primary Cardiologist:Dr Johnsie Cancel   HPI:    Anne Roach is a 88 y.o. female with hypertension, diabetes, permanent atrial fibrillation, and HFpEF.   Admitted 03/27/2022 for acute on chronic diastolic heart failure. Of note, this is her third ER visit since 03/27/22 for dyspnea. Diuresed with IV lasix and transitioned to lasix as needed. Discharged on 04/02/22. D/C weight 171 pounds.    Had initial [facility] clinic f/u on 5/24. Gradually feeling worse since discharge. SOB with exertion. + Orthopnea. Noted to be fluid overloaded and prescribed FUROSCIX 80 mg x 1 w/ instruction to transition back to PO Lasix on 5/25. Frequency of Lasix change from PRN to daily.   ReDs clip 35% on 04/17/22 . Denies resting dyspnea but c/w exertional dyspnea. NYHA Class III. SOB ambulating around her home. Denies orthopnea/PND. BP 110/68. Seen by CHF PA and recommended Entresto but daughter deferred fearing side effects 04/17/22 K 3.8 Cr 1.17 BUN 17 BNP improved from 1111 to 472   Having some dysphagia to solids Needs PT/OT at home Otherwise doing well    Cardiac Testing  Echo 03/2022 -EF 50-55%  RV normal  LA severely dilated. RA moderately dilated. Small pericardiac effusion.  Echo 10/2020- EF 50-55% RV moderately reduced. LA severely dilated RA mildly dilated.   Review of Systems: [y] = yes, '[ ]'$  = no   General: Weight gain '[ ]'$ ; Weight loss '[ ]'$ ; Anorexia '[ ]'$ ; Fatigue '[ ]'$ ; Fever '[ ]'$ ; Chills '[ ]'$ ; Weakness '[ ]'$   Cardiac: Chest pain/pressure '[ ]'$ ; Resting SOB '[ ]'$ ; Exertional SOB [ Y]; Orthopnea '[ ]'$ ; Pedal Edema '[ ]'$ ; Palpitations '[ ]'$ ; Syncope '[ ]'$ ; Presyncope '[ ]'$ ; Paroxysmal nocturnal dyspnea'[ ]'$   Pulmonary: Cough '[ ]'$ ; Wheezing'[ ]'$ ; Hemoptysis'[ ]'$ ; Sputum '[ ]'$ ; Snoring '[ ]'$   GI: Vomiting'[ ]'$ ; Dysphagia'[ ]'$ ; Melena'[ ]'$ ; Hematochezia '[ ]'$ ; Heartburn'[ ]'$ ; Abdominal pain '[ ]'$ ; Constipation '[ ]'$ ; Diarrhea '[ ]'$ ; BRBPR '[ ]'$   GU: Hematuria'[ ]'$ ; Dysuria '[ ]'$ ; Nocturia'[ ]'$   Vascular: Pain in legs with walking '[ ]'$ ; Pain in feet with  lying flat '[ ]'$ ; Non-healing sores '[ ]'$ ; Stroke '[ ]'$ ; TIA '[ ]'$ ; Slurred speech '[ ]'$ ;  Neuro: Headaches'[ ]'$ ; Vertigo'[ ]'$ ; Seizures'[ ]'$ ; Paresthesias'[ ]'$ ;Blurred vision '[ ]'$ ; Diplopia '[ ]'$ ; Vision changes '[ ]'$   Ortho/Skin: Arthritis '[ ]'$ ; Joint pain [Y ]; Muscle pain '[ ]'$ ; Joint swelling '[ ]'$ ; Back Pain [ Y]; Rash '[ ]'$   Psych: Depression'[ ]'$ ; Anxiety'[ ]'$   Heme: Bleeding problems '[ ]'$ ; Clotting disorders '[ ]'$ ; Anemia '[ ]'$   Endocrine: Diabetes [ Y]; Thyroid dysfunction'[ ]'$    Past Medical History:  Diagnosis Date   Atrial fibrillation (HCC)    Chronic diastolic CHF (congestive heart failure) ([place]) 10/31/2020   DM (diabetes mellitus) (HCC)    HTN (hypertension)    Tremor     Current Outpatient Medications  Medication Sig Dispense Refill   ACCU-CHEK AVIVA PLUS test strip      acetaminophen (TYLENOL) 500 MG tablet Take 1,000 mg by mouth every 6 (six) hours as needed for mild pain or headache.     allopurinol (ZYLOPRIM) 100 MG tablet TAKE 1 TABLET(100 MG) BY MOUTH DAILY 90 tablet 2   apixaban (ELIQUIS) 5 MG TABS tablet TAKE 1 TABLET(5 MG) BY MOUTH TWICE DAILY 60 tablet 5   cholecalciferol (VITAMIN D3) 25 MCG (1000 UNIT) tablet Take 1,000 Units by mouth daily with breakfast.     diclofenac Sodium (VOLTAREN) 1 % GEL  Apply 2 g topically 4 (four) times daily. 811 g 0   folic acid (FOLVITE) 914 MCG tablet Take 800 mcg by mouth daily.     furosemide (LASIX) 40 MG tablet Take 1 tablet (40 mg total) by mouth daily. 30 tablet 0   gabapentin (NEURONTIN) 400 MG capsule Take 400 mg by mouth 3 (three) times daily.     Ginkgo Biloba (GINKOBA PO) Take 120 mg by mouth daily.     MAGNESIUM PO Take 1 tablet by mouth daily with lunch.     metFORMIN (GLUCOPHAGE-XR) 750 MG 24 hr tablet Take 750 mg by mouth at bedtime.  2   Multiple Vitamins-Minerals (ICAPS AREDS 2 PO) Take 1 capsule by mouth 2 (two) times daily.     Potassium Chloride ER 20 MEQ TBCR Take 20 mEq by mouth daily. 60 tablet    traMADol (ULTRAM) 50 MG tablet Take 1 tablet  (50 mg total) by mouth every 12 (twelve) hours as needed.     Zinc 50 MG CAPS Take 1 capsule by mouth daily.     empagliflozin (JARDIANCE) 10 MG TABS tablet Take 1 tablet (10 mg total) by mouth daily. 90 tablet 3   ferrous sulfate 325 (65 FE) MG tablet Take 1 tablet (325 mg total) by mouth daily. (Patient not taking: Reported on 05/01/2022) 30 tablet 0   metoprolol succinate (TOPROL-XL) 50 MG 24 hr tablet Take 1 tablet (50 mg total) by mouth daily. Take with or immediately following a meal. 90 tablet 3   Current Facility-Administered Medications  Medication Dose Route Frequency Provider Last Rate Last Admin   betamethasone acetate-betamethasone sodium phosphate (CELESTONE) injection 3 mg  3 mg Intramuscular Once Edrick Kins, DPM        No Known Allergies    Social History   Socioeconomic History   Marital status: Widowed    Spouse name: Not on file   Number of children: 2   Years of education: 12   Highest education level: High school graduate  Occupational History    Comment: Retired    Comment: retired  Tobacco Use   Smoking status: Never   Smokeless tobacco: Never  Scientific laboratory technician Use: Never used  Substance and Sexual Activity   Alcohol use: No    Alcohol/week: 0.0 standard drinks of alcohol   Drug use: No   Sexual activity: Not on file  Other Topics Concern   Not on file  Social History Narrative   Speaks English and Arabic. Patient lives at home with her daughter [name]). Patient is widowed.   Patient is retired.   Education high school.   Right handed.   Two children.   Caffeine None   Social Determinants of Health   Financial Resource Strain: Low Risk  (03/28/2022)   Overall Financial Resource Strain (CARDIA)    Difficulty of Paying Living Expenses: Not hard at all  Food Insecurity: No Food Insecurity (03/28/2022)   Hunger Vital Sign    Worried About Running Out of Food in the Last Year: Never true    Ran Out of Food in the Last Year: Never true   Transportation Needs: No Transportation Needs (03/28/2022)   PRAPARE - Hydrologist (Medical): No    Lack of Transportation (Non-Medical): No  Physical Activity: Not on file  Stress: Not on file  Social Connections: Not on file  Intimate Partner Violence: Not on file      Family History  Problem  Relation Age of Onset   Diabetes Father    Asthma Brother     Vitals:   05/01/22 0827  BP: 112/62  Pulse: 63  SpO2: 95%  Weight: 169 lb (76.7 kg)  Height: '5\' 6"'$  (1.676 m)     Wt Readings from Last 3 Encounters:  05/01/22 169 lb (76.7 kg)  04/17/22 169 lb 6.4 oz (76.8 kg)  04/09/22 174 lb 6.4 oz (79.1 kg)    PHYSICAL EXAM:  Affect appropriate Healthy:  appears stated age HEENT: normal Neck supple with no adenopathy JVP normal no bruits no thyromegaly Lungs clear with no wheezing and good diaphragmatic motion Heart:  S1/S2 no murmur, no rub, gallop or click PMI normal Abdomen: benighn, BS positve, no tenderness, no AAA no bruit.  No HSM or HJR Distal pulses intact with no bruits No edema Neuro non-focal Skin warm and dry No muscular weakness  ECG:  04/09/22 Afib rate 74 nonspecific ST changes   ASSESSMENT & PLAN:  1. HFpEF  - Echo 5/23 EF 50-55% RV normal  -  Grossly euvolemic on exam Discussed Entresto again with daughter and will forego for now  - BNP improved Lasix 40 mg daily   - continue Jardiance 10 mg daily  - continue Toprol XL 50 mg daily  - advised low sodium diet and fluid restriction + daily wts  2. Chronic A fib  -Rate controlled Toprol 50 mg daily  -On eliquis 5 mg twice a day  3. H/O Hyperkalemia  - K 3.8 04/17/22    Follow up 3 months    Jenkins Rouge  MD [facility]

## 2022-05-01 ENCOUNTER — Ambulatory Visit (INDEPENDENT_AMBULATORY_CARE_PROVIDER_SITE_OTHER): Payer: Medicare HMO | Admitting: Cardiovascular Disease

## 2022-05-01 ENCOUNTER — Encounter: Payer: Self-pay | Admitting: Cardiovascular Disease

## 2022-05-01 VITALS — BP 112/62 | HR 63 | Ht 66.0 in | Wt 169.0 lb

## 2022-05-01 DIAGNOSIS — I5032 Chronic diastolic (congestive) heart failure: Secondary | ICD-10-CM | POA: Diagnosis not present

## 2022-05-01 DIAGNOSIS — I482 Chronic atrial fibrillation, unspecified: Secondary | ICD-10-CM

## 2022-05-01 DIAGNOSIS — I10 Essential (primary) hypertension: Secondary | ICD-10-CM | POA: Diagnosis not present

## 2022-05-01 MED ORDER — EMPAGLIFLOZIN 10 MG PO TABS: 10.0000 mg | ORAL_TABLET | Freq: Every day | ORAL | 3 refills | Status: DC

## 2022-05-01 MED ORDER — METOPROLOL SUCCINATE ER 50 MG PO TB24: 50.0000 mg | ORAL_TABLET | Freq: Every day | ORAL | 3 refills | Status: DC

## 2022-05-01 NOTE — Patient Instructions (Addendum)

## 2022-05-06 DIAGNOSIS — H26493 Other secondary cataract, bilateral: Secondary | ICD-10-CM | POA: Diagnosis not present

## 2022-05-06 DIAGNOSIS — H353231 Exudative age-related macular degeneration, bilateral, with active choroidal neovascularization: Secondary | ICD-10-CM | POA: Diagnosis not present

## 2022-05-06 DIAGNOSIS — H35453 Secondary pigmentary degeneration, bilateral: Secondary | ICD-10-CM | POA: Diagnosis not present

## 2022-05-06 DIAGNOSIS — Z961 Presence of intraocular lens: Secondary | ICD-10-CM | POA: Diagnosis not present

## 2022-05-06 DIAGNOSIS — E119 Type 2 diabetes mellitus without complications: Secondary | ICD-10-CM | POA: Diagnosis not present

## 2022-05-06 DIAGNOSIS — H35363 Drusen (degenerative) of macula, bilateral: Secondary | ICD-10-CM | POA: Diagnosis not present

## 2022-05-07 DIAGNOSIS — M65312 Trigger thumb, left thumb: Secondary | ICD-10-CM | POA: Diagnosis not present

## 2022-05-08 ENCOUNTER — Other Ambulatory Visit (HOSPITAL_COMMUNITY): Payer: Self-pay | Admitting: Adult Health

## 2022-05-08 DIAGNOSIS — E119 Type 2 diabetes mellitus without complications: Secondary | ICD-10-CM | POA: Diagnosis not present

## 2022-05-08 DIAGNOSIS — E1121 Type 2 diabetes mellitus with diabetic nephropathy: Secondary | ICD-10-CM | POA: Diagnosis not present

## 2022-05-08 DIAGNOSIS — G629 Polyneuropathy, unspecified: Secondary | ICD-10-CM | POA: Diagnosis not present

## 2022-05-08 DIAGNOSIS — R1310 Dysphagia, unspecified: Secondary | ICD-10-CM | POA: Diagnosis not present

## 2022-05-08 DIAGNOSIS — N1831 Chronic kidney disease, stage 3a: Secondary | ICD-10-CM | POA: Diagnosis not present

## 2022-05-08 DIAGNOSIS — M5459 Other low back pain: Secondary | ICD-10-CM | POA: Diagnosis not present

## 2022-05-08 DIAGNOSIS — I509 Heart failure, unspecified: Secondary | ICD-10-CM | POA: Diagnosis not present

## 2022-05-08 DIAGNOSIS — R531 Weakness: Secondary | ICD-10-CM | POA: Diagnosis not present

## 2022-05-08 DIAGNOSIS — M109 Gout, unspecified: Secondary | ICD-10-CM | POA: Diagnosis not present

## 2022-05-12 ENCOUNTER — Other Ambulatory Visit (HOSPITAL_COMMUNITY): Payer: Self-pay

## 2022-05-12 DIAGNOSIS — R1310 Dysphagia, unspecified: Secondary | ICD-10-CM

## 2022-05-14 DIAGNOSIS — H353221 Exudative age-related macular degeneration, left eye, with active choroidal neovascularization: Secondary | ICD-10-CM | POA: Diagnosis not present

## 2022-05-28 ENCOUNTER — Ambulatory Visit (HOSPITAL_COMMUNITY)
Admission: RE | Admit: 2022-05-28 | Discharge: 2022-05-28 | Disposition: A | Discharge status: Home / Self Care | Payer: Medicare HMO | Source: Ambulatory Visit | Attending: Family Medicine | Admitting: Family Medicine

## 2022-05-28 DIAGNOSIS — R1310 Dysphagia, unspecified: Secondary | ICD-10-CM

## 2022-05-28 DIAGNOSIS — R1314 Dysphagia, pharyngoesophageal phase: Secondary | ICD-10-CM

## 2022-07-02 ENCOUNTER — Other Ambulatory Visit: Payer: Self-pay | Admitting: Cardiovascular Disease

## 2022-07-02 NOTE — Telephone Encounter (Signed)
Pt last saw Dr Johnsie Cancel 05/01/22, last labs 04/17/22 Creat 1.17, age 88, weight 76.7kg, based on specified criteria pt is on appropriate dosage of Eliquis '5mg'$  BID for afib.  Will refill rx.

## 2022-07-16 ENCOUNTER — Ambulatory Visit
Admission: RE | Admit: 2022-07-16 | Discharge: 2022-07-16 | Disposition: A | Discharge status: Home / Self Care | Payer: Medicare HMO | Source: Ambulatory Visit | Attending: Family Medicine | Admitting: Family Medicine

## 2022-07-16 ENCOUNTER — Other Ambulatory Visit: Payer: Self-pay | Admitting: Family Medicine

## 2022-07-16 DIAGNOSIS — R609 Edema, unspecified: Secondary | ICD-10-CM

## 2022-07-16 DIAGNOSIS — R52 Pain, unspecified: Secondary | ICD-10-CM

## 2022-10-16 NOTE — Progress Notes (Signed)
Primary Care: Dr Pearlie Oyster Primary Cardiologist:Dr Johnsie Cancel   HPI:    Ms Fossum is a 88 y.o. female with hypertension, diabetes, permanent atrial fibrillation, and HFpEF.   Admitted 03/27/2022 for acute on chronic diastolic heart failure. Of note, this is her third ER visit since 03/27/22 for dyspnea. Diuresed with IV lasix and transitioned to lasix as needed. Discharged on 04/02/22. D/C weight 171 pounds.    Had initial [facility] f/u on 5/24. Gradually feeling worse since discharge. SOB with exertion. + Orthopnea. Noted to be fluid overloaded and prescribed FUROSCIX 80 mg x 1 w/ instruction to transition back to PO Lasix on 5/25. Frequency of Lasix change from PRN to daily.   ReDs clip 35% on 04/17/22 . Denies resting dyspnea but c/w exertional dyspnea. NYHA Class III. SOB ambulating around her home. Denies orthopnea/PND. BP 110/68. Seen by CHF PA and recommended Entresto but daughter deferred fearing side effects 04/17/22 K 3.8 Cr 1.17 BUN 17 BNP improved from 1111 to 472   Having some dysphagia to solids Needs PT/OT at home Otherwise doing well  Lots of family in [place] still and it has been difficutl    Cardiac Testing  Echo 03/2022 -EF 50-55%  RV normal  LA severely dilated. RA moderately dilated. Small pericardiac effusion.  Echo 10/2020- EF 50-55% RV moderately reduced. LA severely dilated RA mildly dilated.   Review of Systems: [y] = yes, '[ ]'$  = no   General: Weight gain '[ ]'$ ; Weight loss '[ ]'$ ; Anorexia '[ ]'$ ; Fatigue '[ ]'$ ; Fever '[ ]'$ ; Chills '[ ]'$ ; Weakness '[ ]'$   Cardiac: Chest pain/pressure '[ ]'$ ; Resting SOB '[ ]'$ ; Exertional SOB [ Y]; Orthopnea '[ ]'$ ; Pedal Edema '[ ]'$ ; Palpitations '[ ]'$ ; Syncope '[ ]'$ ; Presyncope '[ ]'$ ; Paroxysmal nocturnal dyspnea'[ ]'$   Pulmonary: Cough '[ ]'$ ; Wheezing'[ ]'$ ; Hemoptysis'[ ]'$ ; Sputum '[ ]'$ ; Snoring '[ ]'$   GI: Vomiting'[ ]'$ ; Dysphagia'[ ]'$ ; Melena'[ ]'$ ; Hematochezia '[ ]'$ ; Heartburn'[ ]'$ ; Abdominal pain '[ ]'$ ; Constipation '[ ]'$ ; Diarrhea '[ ]'$ ; BRBPR '[ ]'$   GU: Hematuria'[ ]'$ ; Dysuria '[ ]'$ ; Nocturia'[ ]'$    Vascular: Pain in legs with walking '[ ]'$ ; Pain in feet with lying flat '[ ]'$ ; Non-healing sores '[ ]'$ ; Stroke '[ ]'$ ; TIA '[ ]'$ ; Slurred speech '[ ]'$ ;  Neuro: Headaches'[ ]'$ ; Vertigo'[ ]'$ ; Seizures'[ ]'$ ; Paresthesias'[ ]'$ ;Blurred vision '[ ]'$ ; Diplopia '[ ]'$ ; Vision changes '[ ]'$   Ortho/Skin: Arthritis '[ ]'$ ; Joint pain [Y ]; Muscle pain '[ ]'$ ; Joint swelling '[ ]'$ ; Back Pain [ Y]; Rash '[ ]'$   Psych: Depression'[ ]'$ ; Anxiety'[ ]'$   Heme: Bleeding problems '[ ]'$ ; Clotting disorders '[ ]'$ ; Anemia '[ ]'$   Endocrine: Diabetes [ Y]; Thyroid dysfunction'[ ]'$    Past Medical History:  Diagnosis Date   Atrial fibrillation (HCC)    Chronic diastolic CHF (congestive heart failure) ([facility]) 10/31/2020   DM (diabetes mellitus) (HCC)    HTN (hypertension)    Tremor     Current Outpatient Medications  Medication Sig Dispense Refill   ACCU-CHEK AVIVA PLUS test strip      acetaminophen (TYLENOL) 500 MG tablet Take 1,000 mg by mouth every 6 (six) hours as needed for mild pain or headache.     allopurinol (ZYLOPRIM) 100 MG tablet TAKE 1 TABLET(100 MG) BY MOUTH DAILY 90 tablet 2   apixaban (ELIQUIS) 5 MG TABS tablet TAKE 1 TABLET(5 MG) BY MOUTH TWICE DAILY 60 tablet 6   cholecalciferol (VITAMIN D3) 25 MCG (1000 UNIT) tablet Take 1,000 Units by mouth daily  with breakfast.     diclofenac Sodium (VOLTAREN) 1 % GEL Apply 2 g topically 4 (four) times daily. 150 g 0   empagliflozin (JARDIANCE) 10 MG TABS tablet Take 1 tablet (10 mg total) by mouth daily. 90 tablet 3   ferrous sulfate 325 (65 FE) MG tablet Take 1 tablet (325 mg total) by mouth daily. (Patient not taking: Reported on 05/01/2022) 30 tablet 0   folic acid (FOLVITE) 161 MCG tablet Take 800 mcg by mouth daily.     furosemide (LASIX) 40 MG tablet TAKE 1 TABLET(40 MG) BY MOUTH DAILY 90 tablet 3   gabapentin (NEURONTIN) 400 MG capsule Take 400 mg by mouth 3 (three) times daily.     Ginkgo Biloba (GINKOBA PO) Take 120 mg by mouth daily.     MAGNESIUM PO Take 1 tablet by mouth daily with lunch.      metFORMIN (GLUCOPHAGE-XR) 750 MG 24 hr tablet Take 750 mg by mouth at bedtime.  2   metoprolol succinate (TOPROL-XL) 50 MG 24 hr tablet Take 1 tablet (50 mg total) by mouth daily. Take with or immediately following a meal. 90 tablet 3   Multiple Vitamins-Minerals (ICAPS AREDS 2 PO) Take 1 capsule by mouth 2 (two) times daily.     Potassium Chloride ER 20 MEQ TBCR Take 20 mEq by mouth daily. 60 tablet    traMADol (ULTRAM) 50 MG tablet Take 1 tablet (50 mg total) by mouth every 12 (twelve) hours as needed.     Zinc 50 MG CAPS Take 1 capsule by mouth daily.     Current Facility-Administered Medications  Medication Dose Route Frequency Provider Last Rate Last Admin   betamethasone acetate-betamethasone sodium phosphate (CELESTONE) injection 3 mg  3 mg Intramuscular Once Edrick Kins, DPM        No Known Allergies    Social History   Socioeconomic History   Marital status: Widowed    Spouse name: Not on file   Number of children: 2   Years of education: 12   Highest education level: High school graduate  Occupational History    Comment: Retired    Comment: retired  Tobacco Use   Smoking status: Never   Smokeless tobacco: Never  Scientific laboratory technician Use: Never used  Substance and Sexual Activity   Alcohol use: No    Alcohol/week: 0.0 standard drinks of alcohol   Drug use: No   Sexual activity: Not on file  Other Topics Concern   Not on file  Social History Narrative   Speaks English and Arabic. Patient lives at home with her daughter [name]). Patient is widowed.   Patient is retired.   Education high school.   Right handed.   Two children.   Caffeine None   Social Determinants of Health   Financial Resource Strain: Low Risk  (03/28/2022)   Overall Financial Resource Strain (CARDIA)    Difficulty of Paying Living Expenses: Not hard at all  Food Insecurity: No Food Insecurity (03/28/2022)   Hunger Vital Sign    Worried About Running Out of Food in the Last Year: Never  true    Ran Out of Food in the Last Year: Never true  Transportation Needs: No Transportation Needs (03/28/2022)   PRAPARE - Hydrologist (Medical): No    Lack of Transportation (Non-Medical): No  Physical Activity: Not on file  Stress: Not on file  Social Connections: Not on file  Intimate Partner Violence: Not on  file      Family History  Problem Relation Age of Onset   Diabetes Father    Asthma Brother     There were no vitals filed for this visit.    Wt Readings from Last 3 Encounters:  05/01/22 169 lb (76.7 kg)  04/17/22 169 lb 6.4 oz (76.8 kg)  04/09/22 174 lb 6.4 oz (79.1 kg)    PHYSICAL EXAM:  Affect appropriate Healthy:  appears stated age 66: normal Neck supple with no adenopathy JVP normal no bruits no thyromegaly Lungs clear with no wheezing and good diaphragmatic motion Heart:  S1/S2 no murmur, no rub, gallop or click PMI normal Abdomen: benighn, BS positve, no tenderness, no AAA no bruit.  No HSM or HJR Distal pulses intact with no bruits No edema Neuro non-focal Skin warm and dry No muscular weakness  ECG:  04/09/22 Afib rate 74 nonspecific ST changes   ASSESSMENT & PLAN:  1. HFpEF  - Echo 5/23 EF 50-55% RV normal  -  Grossly euvolemic on exam Discussed Entresto again with daughter and will forego for now  - BNP improved Lasix 40 mg daily   - continue Jardiance 10 mg daily  - continue Toprol XL 50 mg daily  - advised low sodium diet and fluid restriction + daily wts  2. Chronic A fib  -Rate controlled Toprol 50 mg daily  -On eliquis 5 mg twice a day  3. H/O Hyperkalemia  - K 3.8 04/17/22   BMET/BNP   Follow up 6 months    Jenkins Rouge  MD [facility]

## 2022-10-22 ENCOUNTER — Other Ambulatory Visit: Payer: Self-pay | Admitting: Physician Assistant

## 2022-10-22 ENCOUNTER — Encounter: Payer: Self-pay | Admitting: Physician Assistant

## 2022-10-22 DIAGNOSIS — R1310 Dysphagia, unspecified: Secondary | ICD-10-CM

## 2022-10-22 DIAGNOSIS — K862 Cyst of pancreas: Secondary | ICD-10-CM

## 2022-10-29 ENCOUNTER — Ambulatory Visit: Payer: Medicare HMO | Attending: Cardiovascular Disease | Admitting: Cardiovascular Disease

## 2022-10-29 ENCOUNTER — Encounter: Payer: Self-pay | Admitting: Cardiovascular Disease

## 2022-10-29 VITALS — BP 132/64 | HR 65 | Ht 66.0 in | Wt 173.6 lb

## 2022-10-29 DIAGNOSIS — R002 Palpitations: Secondary | ICD-10-CM

## 2022-10-29 DIAGNOSIS — I10 Essential (primary) hypertension: Secondary | ICD-10-CM

## 2022-10-29 DIAGNOSIS — I482 Chronic atrial fibrillation, unspecified: Secondary | ICD-10-CM

## 2022-10-29 DIAGNOSIS — I5032 Chronic diastolic (congestive) heart failure: Secondary | ICD-10-CM | POA: Diagnosis not present

## 2022-10-29 NOTE — Patient Instructions (Signed)

## 2022-10-31 ENCOUNTER — Other Ambulatory Visit: Payer: Self-pay | Admitting: Physician Assistant

## 2022-10-31 ENCOUNTER — Ambulatory Visit
Admission: RE | Admit: 2022-10-31 | Discharge: 2022-10-31 | Disposition: A | Discharge status: Home / Self Care | Payer: Medicare HMO | Source: Ambulatory Visit | Attending: Physician Assistant | Admitting: Physician Assistant

## 2022-10-31 DIAGNOSIS — R1310 Dysphagia, unspecified: Secondary | ICD-10-CM

## 2022-11-18 ENCOUNTER — Telehealth: Payer: Self-pay | Admitting: *Deleted

## 2022-11-18 ENCOUNTER — Other Ambulatory Visit: Payer: Self-pay | Admitting: Gastroenterology

## 2022-11-18 DIAGNOSIS — I700 Atherosclerosis of aorta: Secondary | ICD-10-CM

## 2022-11-18 NOTE — Telephone Encounter (Signed)
   Pre-operative Risk Assessment    Patient Name: Anne Roach  DOB: Oct 04, 1933 MRN: [id number]      Request for Surgical Clearance    Procedure:   ENDOSCOPY  Date of Surgery:  Clearance 12/21/22                                 Surgeon:  DR. [facility] Surgeon's Group or Practice Name:  [facility] Phone number:  [phone] Fax number:  [phone]   Type of Clearance Requested:   - Pharmacy:  Hold Apixaban (Eliquis) NOT INDICATED   Type of Anesthesia:  Not Indicated   Additional requests/questions:    Astrid Divine   11/18/2022, 3:29 PM

## 2022-11-19 NOTE — Telephone Encounter (Signed)
   Patient Name: Anne Roach  DOB: [birth year] MRN: [id number]  Primary Cardiologist: Jenkins Rouge, MD  Chart reviewed as part of pre-operative protocol coverage.   Pharmacy please advise on holding time for Eliquis regarding upcoming colonoscopy.  Thank you,  Mable Fill, Marissa Nestle, NP 11/19/2022, 8:13 AM

## 2022-11-20 DIAGNOSIS — I700 Atherosclerosis of aorta: Secondary | ICD-10-CM | POA: Insufficient documentation

## 2022-11-20 NOTE — Telephone Encounter (Signed)
   Patient Name: Anne Roach  DOB: Aug 14, 1933 MRN: [id number]  Primary Cardiologist: Jenkins Rouge, MD  Chart reviewed as part of pre-operative protocol coverage.   Per office protocol, patient can hold Eliquis for 1-2 days prior to procedure.      Mable Fill, Marissa Nestle, NP 11/20/2022, 9:14 AM

## 2022-11-20 NOTE — Telephone Encounter (Signed)
Patient with diagnosis of afib on Eliquis for anticoagulation.    Procedure: endoscopy Date of procedure: 12/21/22  CHA2DS2-VASc Score = 7  This indicates a 11.2% annual risk of stroke. The patient's score is based upon: CHF History: 1 HTN History: 1 Diabetes History: 1 Stroke History: 0 Vascular Disease History: 1 Age Score: 2 Gender Score: 1   CrCl 42m/min Platelet count 250K  Per office protocol, patient can hold Eliquis for 1-2 days prior to procedure.    **This guidance is not considered finalized until pre-operative APP has relayed final recommendations.**

## 2022-11-23 ENCOUNTER — Encounter (HOSPITAL_COMMUNITY): Payer: Self-pay | Admitting: Internal Medicine

## 2022-11-23 ENCOUNTER — Inpatient Hospital Stay (HOSPITAL_COMMUNITY)
Admission: EM | Admit: 2022-11-23 | Discharge: 2022-11-25 | DRG: 683 | Disposition: A | Discharge status: Home Health | Payer: Medicare HMO | Attending: Family Medicine | Admitting: Family Medicine

## 2022-11-23 ENCOUNTER — Emergency Department (HOSPITAL_COMMUNITY): Payer: Medicare HMO

## 2022-11-23 ENCOUNTER — Other Ambulatory Visit: Payer: Self-pay

## 2022-11-23 DIAGNOSIS — E1159 Type 2 diabetes mellitus with other circulatory complications: Secondary | ICD-10-CM

## 2022-11-23 DIAGNOSIS — N179 Acute kidney failure, unspecified: Secondary | ICD-10-CM | POA: Diagnosis not present

## 2022-11-23 DIAGNOSIS — I4821 Permanent atrial fibrillation: Secondary | ICD-10-CM | POA: Diagnosis present

## 2022-11-23 DIAGNOSIS — J069 Acute upper respiratory infection, unspecified: Secondary | ICD-10-CM | POA: Diagnosis present

## 2022-11-23 DIAGNOSIS — G4733 Obstructive sleep apnea (adult) (pediatric): Secondary | ICD-10-CM | POA: Diagnosis present

## 2022-11-23 DIAGNOSIS — E876 Hypokalemia: Secondary | ICD-10-CM | POA: Diagnosis present

## 2022-11-23 DIAGNOSIS — R7989 Other specified abnormal findings of blood chemistry: Secondary | ICD-10-CM

## 2022-11-23 DIAGNOSIS — N1832 Chronic kidney disease, stage 3b: Secondary | ICD-10-CM | POA: Diagnosis present

## 2022-11-23 DIAGNOSIS — I130 Hypertensive heart and chronic kidney disease with heart failure and stage 1 through stage 4 chronic kidney disease, or unspecified chronic kidney disease: Secondary | ICD-10-CM | POA: Diagnosis present

## 2022-11-23 DIAGNOSIS — Z825 Family history of asthma and other chronic lower respiratory diseases: Secondary | ICD-10-CM

## 2022-11-23 DIAGNOSIS — Z7984 Long term (current) use of oral hypoglycemic drugs: Secondary | ICD-10-CM

## 2022-11-23 DIAGNOSIS — L89311 Pressure ulcer of right buttock, stage 1: Secondary | ICD-10-CM | POA: Diagnosis present

## 2022-11-23 DIAGNOSIS — E1122 Type 2 diabetes mellitus with diabetic chronic kidney disease: Secondary | ICD-10-CM | POA: Diagnosis present

## 2022-11-23 DIAGNOSIS — R059 Cough, unspecified: Secondary | ICD-10-CM

## 2022-11-23 DIAGNOSIS — E872 Acidosis, unspecified: Secondary | ICD-10-CM | POA: Insufficient documentation

## 2022-11-23 DIAGNOSIS — G8929 Other chronic pain: Secondary | ICD-10-CM | POA: Diagnosis present

## 2022-11-23 DIAGNOSIS — L89321 Pressure ulcer of left buttock, stage 1: Secondary | ICD-10-CM | POA: Diagnosis present

## 2022-11-23 DIAGNOSIS — N183 Chronic kidney disease, stage 3 unspecified: Secondary | ICD-10-CM | POA: Diagnosis present

## 2022-11-23 DIAGNOSIS — E860 Dehydration: Secondary | ICD-10-CM | POA: Diagnosis present

## 2022-11-23 DIAGNOSIS — Z1152 Encounter for screening for COVID-19: Secondary | ICD-10-CM

## 2022-11-23 DIAGNOSIS — E8722 Chronic metabolic acidosis: Secondary | ICD-10-CM | POA: Diagnosis present

## 2022-11-23 DIAGNOSIS — Z833 Family history of diabetes mellitus: Secondary | ICD-10-CM

## 2022-11-23 DIAGNOSIS — I5032 Chronic diastolic (congestive) heart failure: Secondary | ICD-10-CM | POA: Diagnosis present

## 2022-11-23 DIAGNOSIS — I10 Essential (primary) hypertension: Secondary | ICD-10-CM | POA: Diagnosis not present

## 2022-11-23 DIAGNOSIS — K219 Gastro-esophageal reflux disease without esophagitis: Secondary | ICD-10-CM | POA: Diagnosis present

## 2022-11-23 DIAGNOSIS — Z7901 Long term (current) use of anticoagulants: Secondary | ICD-10-CM

## 2022-11-23 DIAGNOSIS — H3530 Unspecified macular degeneration: Secondary | ICD-10-CM | POA: Diagnosis present

## 2022-11-23 DIAGNOSIS — M940 Chondrocostal junction syndrome [Tietze]: Secondary | ICD-10-CM | POA: Diagnosis present

## 2022-11-23 DIAGNOSIS — E119 Type 2 diabetes mellitus without complications: Secondary | ICD-10-CM

## 2022-11-23 DIAGNOSIS — Z79899 Other long term (current) drug therapy: Secondary | ICD-10-CM

## 2022-11-23 LAB — CBC WITH DIFFERENTIAL/PLATELET
Abs Immature Granulocytes: 0.1 10*3/uL — ABNORMAL HIGH (ref 0.00–0.07)
Basophils Absolute: 0.1 10*3/uL (ref 0.0–0.1)
Basophils Relative: 1 %
Eosinophils Absolute: 0.3 10*3/uL (ref 0.0–0.5)
Eosinophils Relative: 3 %
HCT: 30.6 % — ABNORMAL LOW (ref 36.0–46.0)
Hemoglobin: 9 g/dL — ABNORMAL LOW (ref 12.0–15.0)
Immature Granulocytes: 1 %
Lymphocytes Relative: 27 %
Lymphs Abs: 2.4 10*3/uL (ref 0.7–4.0)
MCH: 23.9 pg — ABNORMAL LOW (ref 26.0–34.0)
MCHC: 29.4 g/dL — ABNORMAL LOW (ref 30.0–36.0)
MCV: 81.4 fL (ref 80.0–100.0)
Monocytes Absolute: 0.8 10*3/uL (ref 0.1–1.0)
Monocytes Relative: 9 %
Neutro Abs: 5.3 10*3/uL (ref 1.7–7.7)
Neutrophils Relative %: 59 %
Platelets: 236 10*3/uL (ref 150–400)
RBC: 3.76 MIL/uL — ABNORMAL LOW (ref 3.87–5.11)
RDW: 20.3 % — ABNORMAL HIGH (ref 11.5–15.5)
WBC: 9 10*3/uL (ref 4.0–10.5)
nRBC: 0 % (ref 0.0–0.2)

## 2022-11-23 LAB — URINALYSIS, ROUTINE W REFLEX MICROSCOPIC
Bilirubin Urine: NEGATIVE
Glucose, UA: >=500 mg/dL — AB
Hgb urine dipstick: NEGATIVE
Ketones, ur: NEGATIVE mg/dL
Nitrite: NEGATIVE
Protein, ur: NEGATIVE mg/dL
Specific Gravity, Urine: 1.006 (ref 1.005–1.030)
WBC, UA: >50 WBC/hpf — ABNORMAL HIGH (ref 0–5)
pH: 5 (ref 5.0–8.0)

## 2022-11-23 LAB — COMPREHENSIVE METABOLIC PANEL
ALT: 11 U/L (ref 0–44)
AST: 41 U/L (ref 15–41)
Albumin: 3 g/dL — ABNORMAL LOW (ref 3.5–5.0)
Alkaline Phosphatase: 48 U/L (ref 38–126)
Anion gap: 13 (ref 5–15)
BUN: 20 mg/dL (ref 8–23)
CO2: 21 mmol/L — ABNORMAL LOW (ref 22–32)
Calcium: 8.4 mg/dL — ABNORMAL LOW (ref 8.9–10.3)
Chloride: 101 mmol/L (ref 98–111)
Creatinine, Ser: 1.54 mg/dL — ABNORMAL HIGH (ref 0.44–1.00)
GFR, Estimated: 32 mL/min — ABNORMAL LOW (ref >60)
Glucose, Bld: 196 mg/dL — ABNORMAL HIGH (ref 70–99)
Potassium: 4.2 mmol/L (ref 3.5–5.1)
Sodium: 135 mmol/L (ref 135–145)
Total Bilirubin: 0.4 mg/dL (ref 0.3–1.2)
Total Protein: 7.1 g/dL (ref 6.5–8.1)

## 2022-11-23 LAB — I-STAT CHEM 8, ED
BUN: 20 mg/dL (ref 8–23)
Calcium, Ion: 1.09 mmol/L — ABNORMAL LOW (ref 1.15–1.40)
Chloride: 105 mmol/L (ref 98–111)
Creatinine, Ser: 1.7 mg/dL — ABNORMAL HIGH (ref 0.44–1.00)
Glucose, Bld: 196 mg/dL — ABNORMAL HIGH (ref 70–99)
HCT: 29 % — ABNORMAL LOW (ref 36.0–46.0)
Hemoglobin: 9.9 g/dL — ABNORMAL LOW (ref 12.0–15.0)
Potassium: 4.4 mmol/L (ref 3.5–5.1)
Sodium: 138 mmol/L (ref 135–145)
TCO2: 22 mmol/L (ref 22–32)

## 2022-11-23 LAB — RESP PANEL BY RT-PCR (RSV, FLU A&B, COVID)  RVPGX2
Influenza A by PCR: NEGATIVE
Influenza B by PCR: NEGATIVE
Resp Syncytial Virus by PCR: NEGATIVE
SARS Coronavirus 2 by RT PCR: NEGATIVE

## 2022-11-23 LAB — CBG MONITORING, ED: Glucose-Capillary: 145 mg/dL — ABNORMAL HIGH (ref 70–99)

## 2022-11-23 LAB — TROPONIN I (HIGH SENSITIVITY)
Troponin I (High Sensitivity): 11 ng/L (ref <18)
Troponin I (High Sensitivity): 10 ng/L (ref <18)

## 2022-11-23 LAB — LACTIC ACID, PLASMA: Lactic Acid, Venous: 4.3 mmol/L (ref 0.5–1.9)

## 2022-11-23 MED ORDER — APIXABAN 5 MG PO TABS
5.0000 mg | ORAL_TABLET | Freq: Two times a day (BID) | ORAL | Status: DC
  Administered 2022-11-23 – 2022-11-25 (×4): 5 mg via ORAL
  Filled 2022-11-23 (×4): qty 1

## 2022-11-23 MED ORDER — ALLOPURINOL 100 MG PO TABS
100.0000 mg | ORAL_TABLET | Freq: Every day | ORAL | Status: DC
  Administered 2022-11-24 – 2022-11-25 (×2): 100 mg via ORAL
  Filled 2022-11-23 (×2): qty 1

## 2022-11-23 MED ORDER — ACETAMINOPHEN 650 MG RE SUPP: 650.0000 mg | Freq: Four times a day (QID) | RECTAL | Status: DC | PRN

## 2022-11-23 MED ORDER — EMPAGLIFLOZIN 10 MG PO TABS: 10.0000 mg | ORAL_TABLET | Freq: Every day | ORAL | Status: DC

## 2022-11-23 MED ORDER — ACETAMINOPHEN 325 MG PO TABS
325.0000 mg | ORAL_TABLET | Freq: Once | ORAL | Status: DC
  Filled 2022-11-23: qty 1

## 2022-11-23 MED ORDER — DAPAGLIFLOZIN PROPANEDIOL 10 MG PO TABS
10.0000 mg | ORAL_TABLET | Freq: Every day | ORAL | Status: DC
  Administered 2022-11-24 – 2022-11-25 (×2): 10 mg via ORAL
  Filled 2022-11-23 (×2): qty 1

## 2022-11-23 MED ORDER — SODIUM CHLORIDE 0.9 % IV SOLN: INTRAVENOUS | Status: AC

## 2022-11-23 MED ORDER — SODIUM CHLORIDE 0.9% FLUSH
3.0000 mL | Freq: Two times a day (BID) | INTRAVENOUS | Status: DC
  Administered 2022-11-23 – 2022-11-24 (×3): 3 mL via INTRAVENOUS

## 2022-11-23 MED ORDER — SODIUM CHLORIDE 0.9 % IV SOLN
500.0000 mg | Freq: Once | INTRAVENOUS | Status: AC
  Administered 2022-11-23: 500 mg via INTRAVENOUS
  Filled 2022-11-23: qty 5

## 2022-11-23 MED ORDER — ACETAMINOPHEN 500 MG PO TABS: 1000.0000 mg | ORAL_TABLET | Freq: Once | ORAL | Status: DC

## 2022-11-23 MED ORDER — GABAPENTIN 400 MG PO CAPS
400.0000 mg | ORAL_CAPSULE | Freq: Three times a day (TID) | ORAL | Status: DC
  Administered 2022-11-23 – 2022-11-25 (×5): 400 mg via ORAL
  Filled 2022-11-23 (×6): qty 1

## 2022-11-23 MED ORDER — APIXABAN 5 MG PO TABS: 5.0000 mg | ORAL_TABLET | Freq: Two times a day (BID) | ORAL | Status: DC

## 2022-11-23 MED ORDER — POLYETHYLENE GLYCOL 3350 17 G PO PACK: 17.0000 g | PACK | Freq: Every day | ORAL | Status: DC | PRN

## 2022-11-23 MED ORDER — DONEPEZIL HCL 5 MG PO TABS
5.0000 mg | ORAL_TABLET | Freq: Every day | ORAL | Status: DC
  Administered 2022-11-23 – 2022-11-24 (×2): 5 mg via ORAL
  Filled 2022-11-23 (×2): qty 1

## 2022-11-23 MED ORDER — SODIUM CHLORIDE 0.9 % IV BOLUS
500.0000 mL | Freq: Once | INTRAVENOUS | Status: AC
  Administered 2022-11-23: 500 mL via INTRAVENOUS

## 2022-11-23 MED ORDER — METOPROLOL SUCCINATE ER 50 MG PO TB24
50.0000 mg | ORAL_TABLET | Freq: Every day | ORAL | Status: DC
  Administered 2022-11-24 – 2022-11-25 (×2): 50 mg via ORAL
  Filled 2022-11-23: qty 1
  Filled 2022-11-23: qty 2

## 2022-11-23 MED ORDER — APIXABAN 2.5 MG PO TABS: 2.5000 mg | ORAL_TABLET | Freq: Two times a day (BID) | ORAL | Status: DC

## 2022-11-23 MED ORDER — SODIUM CHLORIDE 0.9 % IV SOLN
1.0000 g | Freq: Once | INTRAVENOUS | Status: AC
  Administered 2022-11-23: 1 g via INTRAVENOUS
  Filled 2022-11-23: qty 10

## 2022-11-23 MED ORDER — ACETAMINOPHEN 325 MG PO TABS
650.0000 mg | ORAL_TABLET | Freq: Four times a day (QID) | ORAL | Status: DC | PRN
  Administered 2022-11-24 – 2022-11-25 (×4): 650 mg via ORAL
  Filled 2022-11-23 (×6): qty 2

## 2022-11-23 MED ORDER — INSULIN ASPART 100 UNIT/ML IJ SOLN: 0.0000 [IU] | Freq: Three times a day (TID) | INTRAMUSCULAR | Status: DC

## 2022-11-23 NOTE — ED Provider Notes (Signed)
Anne Roach Provider Note   CSN: [id number] Arrival date & time: 11/23/22  9811     History  No chief complaint on file.   Anne Roach is a 89 y.o. female.  89 year old female with prior medical history as detailed below presents for evaluation.  She arrives by EMS transport from home.  Patient with approximately 1 week of gradually worsening cough.  Cough is productive.  Patient reports associated nausea, vomiting, and diarrhea.  No reported fevers at home.  Patient does have a prior history of CHF, diabetes, chronic A-fib.  Interpreter used for history.  Additional history obtained from daughter upon her arrival.  The history is provided by medical records, the patient and the EMS personnel. A language interpreter was used.       Home Medications Prior to Admission medications   Medication Sig Start Date End Date Taking? Authorizing Provider  ACCU-CHEK AVIVA PLUS test strip  10/06/20   [provider]  acetaminophen (TYLENOL) 500 MG tablet Take 1,000 mg by mouth every 6 (six) hours as needed for mild pain or headache.    [provider]  allopurinol (ZYLOPRIM) 100 MG tablet TAKE 1 TABLET(100 MG) BY MOUTH DAILY 08/09/21   Edrick Kins, DPM  apixaban (ELIQUIS) 5 MG TABS tablet TAKE 1 TABLET(5 MG) BY MOUTH TWICE DAILY 07/02/22   Josue Hector, MD  cholecalciferol (VITAMIN D3) 25 MCG (1000 UNIT) tablet Take 1,000 Units by mouth daily with breakfast.    [provider]  diclofenac Sodium (VOLTAREN) 1 % GEL Apply 2 g topically 4 (four) times daily. Patient not taking: Reported on 10/29/2022 03/19/22   Deno Etienne, DO  empagliflozin (JARDIANCE) 10 MG TABS tablet Take 1 tablet (10 mg total) by mouth daily. 05/01/22   Josue Hector, MD  FARXIGA 10 MG TABS tablet Take 10 mg by mouth daily. 09/25/22   [provider]  ferrous sulfate 325 (65 FE) MG tablet Take 1 tablet (325 mg total) by mouth daily. Patient  not taking: Reported on 05/01/2022 03/17/22   Lucrezia Starch, MD  folic acid (FOLVITE) 914 MCG tablet Take 800 mcg by mouth daily.    [provider]  furosemide (LASIX) 40 MG tablet TAKE 1 TABLET(40 MG) BY MOUTH DAILY 05/09/22   Josue Hector, MD  gabapentin (NEURONTIN) 400 MG capsule Take 400 mg by mouth 3 (three) times daily. 03/25/22   [provider]  Ginkgo Biloba (GINKOBA PO) Take 120 mg by mouth daily.    [provider]  MAGNESIUM PO Take 1 tablet by mouth daily with lunch.    [provider]  metFORMIN (GLUCOPHAGE-XR) 750 MG 24 hr tablet Take 750 mg by mouth at bedtime. Patient not taking: Reported on 10/29/2022 05/14/15   [provider]  metoprolol succinate (TOPROL-XL) 50 MG 24 hr tablet Take 1 tablet (50 mg total) by mouth daily. Take with or immediately following a meal. 05/01/22   Josue Hector, MD  Multiple Vitamins-Minerals (ICAPS AREDS 2 PO) Take 1 capsule by mouth 2 (two) times daily.    [provider]  Potassium Chloride ER 20 MEQ TBCR Take 20 mEq by mouth daily. 04/09/22   Clegg, Amy D, NP  traMADol (ULTRAM) 50 MG tablet Take 1 tablet (50 mg total) by mouth every 12 (twelve) hours as needed. 04/02/22   Domenic Polite, MD  Zinc 50 MG CAPS Take 1 capsule by mouth daily. Patient not taking: Reported on 10/29/2022  [provider]      Allergies    Patient has no known allergies.    Review of Systems   Review of Systems  All other systems reviewed and are negative.   Physical Exam Updated Vital Signs There were no vitals taken for this visit. Physical Exam Vitals and nursing note reviewed.  Constitutional:      General: She is not in acute distress.    Appearance: Normal appearance. She is well-developed.     Comments: Alert, ill-appearing, coughing in fits during exam  HENT:     Head: Normocephalic and atraumatic.  Eyes:     Conjunctiva/sclera: Conjunctivae normal.     Pupils: Pupils are equal,  round, and reactive to light.  Cardiovascular:     Rate and Rhythm: Tachycardia present. Rhythm irregular.     Heart sounds: Normal heart sounds.  Pulmonary:     Effort: Pulmonary effort is normal. No respiratory distress.     Comments: Decreased breath sounds at bilateral bases Abdominal:     General: There is no distension.     Palpations: Abdomen is soft.     Tenderness: There is no abdominal tenderness.  Musculoskeletal:        General: No deformity. Normal range of motion.     Cervical back: Normal range of motion and neck supple.  Skin:    General: Skin is warm and dry.  Neurological:     General: No focal deficit present.     Mental Status: She is alert and oriented to person, place, and time.     ED Results / Procedures / Treatments   Labs (all labs ordered are listed, but only abnormal results are displayed) Labs Reviewed  CBC WITH DIFFERENTIAL/PLATELET - Abnormal; Notable for the following components:      Result Value   RBC 3.76 (*)    Hemoglobin 9.0 (*)    HCT 30.6 (*)    MCH 23.9 (*)    MCHC 29.4 (*)    RDW 20.3 (*)    Abs Immature Granulocytes 0.10 (*)    All other components within normal limits  COMPREHENSIVE METABOLIC PANEL - Abnormal; Notable for the following components:   CO2 21 (*)    Glucose, Bld 196 (*)    Creatinine, Ser 1.54 (*)    Calcium 8.4 (*)    Albumin 3.0 (*)    GFR, Estimated 32 (*)    All other components within normal limits  LACTIC ACID, PLASMA - Abnormal; Notable for the following components:   Lactic Acid, Venous 4.3 (*)    All other components within normal limits  I-STAT CHEM 8, ED - Abnormal; Notable for the following components:   Creatinine, Ser 1.70 (*)    Glucose, Bld 196 (*)    Calcium, Ion 1.09 (*)    Hemoglobin 9.9 (*)    HCT 29.0 (*)    All other components within normal limits  RESP PANEL BY RT-PCR (RSV, FLU A&B, COVID)  RVPGX2  CULTURE, BLOOD (ROUTINE X 2)  CULTURE, BLOOD (ROUTINE X 2)  URINE CULTURE  LACTIC  ACID, PLASMA  URINALYSIS, ROUTINE W REFLEX MICROSCOPIC  BRAIN NATRIURETIC PEPTIDE  TROPONIN I (HIGH SENSITIVITY)  TROPONIN I (HIGH SENSITIVITY)    EKG EKG Interpretation  Date/Time:  Sunday November 23 2022 10:00:30 EST Ventricular Rate:  117 PR Interval:    QRS Duration: 95 QT Interval:  346 QTC Calculation: 483 R Axis:   64 Text Interpretation: Atrial fibrillation Anteroseptal infarct, age indeterminate  Confirmed by Dene Gentry [phone]) on 11/23/2022 10:03:36 AM  Radiology No results found.  Procedures Procedures    Medications Ordered in ED Medications - No data to display  ED Course/ Medical Decision Making/ A&P                           Medical Decision Making Amount and/or Complexity of Data Reviewed Labs: ordered. Radiology: ordered.    Medical Screen Complete  This patient presented to the ED with complaint of cough, weakness, decreased p.o. intake.  This complaint involves an extensive number of treatment options. The initial differential diagnosis includes, but is not limited to, bacterial versus viral infection, metabolic abnormality, dehydration, sepsis, etc.  This presentation is: Acute, Self-Limited, Previously Undiagnosed, Uncertain Prognosis, Complicated, Systemic Symptoms, and Threat to Life/Bodily Function  Patient is presenting with roughly 5 to 7 days of gradually worsening cough.  Patient with decreased p.o. intake.  Patient with increasing weakness.  Patient with significant productive cough on history.     Additional history obtained: External records from outside sources obtained and reviewed including prior ED visits and prior Inpatient records.    Lab Tests:  I ordered and personally interpreted labs.  The pertinent results include:  CBC CMP ISTAT CHEM 8 LACTIC Acid COVID FLU RSV   Imaging Studies ordered:  I ordered imaging studies including CXR  I independently visualized and interpreted obtained imaging which showed NAD I  agree with the radiologist interpretation.   Cardiac Monitoring:  The patient was maintained on a cardiac monitor.  I personally viewed and interpreted the cardiac monitor which showed an underlying rhythm of: NSR   Medicines ordered:  I ordered medication including antibiotics for presumed pneumonia Reevaluation of the patient after these medicines showed that the patient: improved    Problem List / ED Course:  Cough, weakness, elevated lactic acid, dehydration   Reevaluation:  After the interventions noted above, I reevaluated the patient and found that they have: improved Disposition:  After consideration of the diagnostic results and the patients response to treatment, I feel that the patent would benefit from admission.          Final Clinical Impression(s) / ED Diagnoses Final diagnoses:  Cough, unspecified type  Dehydration  Elevated lactic acid level    Rx / DC Orders ED Discharge Orders     None         Valarie Merino, MD 11/23/22 1309

## 2022-11-23 NOTE — ED Triage Notes (Signed)
Pt was BIB by [facility] from home. Pt has had a worsening productive cough for the past week. Pt is experiencing pain with coughing and taking deep breaths. Pt has developed Nausea, Vomiting, and diarrhea. EMS witnessed yellow productive sputum.   Pt has hx of CHF, DM, and Afib   EMS stated pt was warm to the touch and clammy. Pt has been taking Theraflu. Pt received 50 ml of NS. Pt has 20g in LFA.   Pt primarily speaks arabic, but can speak a little bi of english.   LVS  114/70  120-135 in afib  Cbg 239  95 RA

## 2022-11-23 NOTE — ED Notes (Signed)
Patient family requesting medication for diarrhea. Per MD patient needs to be screened for C-diff prior to medication. Family and patient aware and understanding, provided hat for toilet and bed side commode as well.

## 2022-11-23 NOTE — ED Notes (Signed)
MD notified that patient is refusing sliding scale insulin

## 2022-11-23 NOTE — ED Notes (Signed)
Patient family member has approached the nurses station about the room being cold. RN explained that we have minimal control over the room temperature and offered warm blankets. The family member requested to speak with the charge nurse and the provider about taking her mother home AMA due to the temperature in the room. RN provided warm packs to the patient and checked temperature to verify no fever. Patient remains afebrile.

## 2022-11-23 NOTE — H&P (Addendum)
History and Physical   Anne Roach WPY:[id number] DOB: [birth year] DOA: 11/23/2022  PCP: Lin Landsman, MD   Patient coming from: Home  Chief Complaint: Cough, nausea, vomiting, diarrhea  HPI: Anne Roach is a 89 y.o. female with medical history significant of diabetes, atrial fibrillation, OSA, CKD 3, hypertension, GERD, macular degeneration, chronic pain, CHF presenting with cough and associated symptoms.  History obtained with assistance of chart review, family, translation.  Patient reporting about a week of gradually worsening cough and congestion.  Has had associated nausea vomiting and diarrhea as well.  Reports subjective fever but not measured.  Also reports some chest pressure sensation as well as shortness of breath.  Denies chills, abdominal pain, constipation  ED Course: Vital signs in the ED significant for blood pressure in the 05L to 976 systolic, heart rate in the 100s to 130s.  Lab workup included CMP with BUN of 21, creatinine elevated to 1.54 from baseline around 1.2, glucose 196, calcium 8.4, albumin 3.0.  CBC with hemoglobin stable at 9.0.  Lactic acid elevated to 4.3, repeat pending.  Troponin negative x 2.  BMP pending.  Respiratory panel for flu COVID RSV negative.  Urinalysis and urine culture pending.  Chest x-ray showed retrocardiac poorly assessed but otherwise no acute abnormality.  Patient received ceftriaxone and azithromycin in the ED as well as 500 cc of IV fluids.  Review of Systems: As per HPI otherwise all other systems reviewed and are negative.  Past Medical History:  Diagnosis Date   Acute on chronic diastolic CHF (congestive heart failure) ([place]) 03/27/2022   Atrial fibrillation (HCC)    Atrial fibrillation with RVR (HCC) 03/20/2017   Chronic diastolic CHF (congestive heart failure) ([place]) 10/31/2020   DM (diabetes mellitus) ([place])    HTN (hypertension)    PNA (pneumonia) 10/31/2020   Sepsis secondary to UTI ([place]) 07/11/2020   Tremor      Past Surgical History:  Procedure Laterality Date   APPENDECTOMY     CARPAL TUNNEL RELEASE     CATARACT EXTRACTION Bilateral    HERNIA REPAIR      Social History  reports that she has never smoked. She has never used smokeless tobacco. She reports that she does not drink alcohol and does not use drugs.  No Known Allergies  Family History  Problem Relation Age of Onset   Diabetes Father    Asthma Brother   Reviewed on admission  Prior to Admission medications   Medication Sig Start Date End Date Taking? Authorizing Provider  ACCU-CHEK AVIVA PLUS test strip  10/06/20   [provider]  acetaminophen (TYLENOL) 500 MG tablet Take 1,000 mg by mouth every 6 (six) hours as needed for mild pain or headache.    [provider]  allopurinol (ZYLOPRIM) 100 MG tablet TAKE 1 TABLET(100 MG) BY MOUTH DAILY 08/09/21   Edrick Kins, DPM  apixaban (ELIQUIS) 5 MG TABS tablet TAKE 1 TABLET(5 MG) BY MOUTH TWICE DAILY 07/02/22   Josue Hector, MD  cholecalciferol (VITAMIN D3) 25 MCG (1000 UNIT) tablet Take 1,000 Units by mouth daily with breakfast.    [provider]  diclofenac Sodium (VOLTAREN) 1 % GEL Apply 2 g topically 4 (four) times daily. Patient not taking: Reported on 10/29/2022 03/19/22   Deno Etienne, DO  empagliflozin (JARDIANCE) 10 MG TABS tablet Take 1 tablet (10 mg total) by mouth daily. 05/01/22   Josue Hector, MD  FARXIGA 10 MG TABS tablet Take 10 mg by mouth  daily. 09/25/22   [provider]  ferrous sulfate 325 (65 FE) MG tablet Take 1 tablet (325 mg total) by mouth daily. Patient not taking: Reported on 05/01/2022 03/17/22   Lucrezia Starch, MD  folic acid (FOLVITE) 867 MCG tablet Take 800 mcg by mouth daily.    [provider]  furosemide (LASIX) 40 MG tablet TAKE 1 TABLET(40 MG) BY MOUTH DAILY 05/09/22   Josue Hector, MD  gabapentin (NEURONTIN) 400 MG capsule Take 400 mg by mouth 3 (three) times daily. 03/25/22   [provider]  Ginkgo Biloba (GINKOBA PO) Take 120 mg by mouth daily.    [provider]  MAGNESIUM PO Take 1 tablet by mouth daily with lunch.    [provider]  metFORMIN (GLUCOPHAGE-XR) 750 MG 24 hr tablet Take 750 mg by mouth at bedtime. Patient not taking: Reported on 10/29/2022 05/14/15   [provider]  metoprolol succinate (TOPROL-XL) 50 MG 24 hr tablet Take 1 tablet (50 mg total) by mouth daily. Take with or immediately following a meal. 05/01/22   Josue Hector, MD  Multiple Vitamins-Minerals (ICAPS AREDS 2 PO) Take 1 capsule by mouth 2 (two) times daily.    [provider]  Potassium Chloride ER 20 MEQ TBCR Take 20 mEq by mouth daily. 04/09/22   Clegg, Amy D, NP  traMADol (ULTRAM) 50 MG tablet Take 1 tablet (50 mg total) by mouth every 12 (twelve) hours as needed. 04/02/22   Domenic Polite, MD  Zinc 50 MG CAPS Take 1 capsule by mouth daily. Patient not taking: Reported on 10/29/2022    [provider]    Physical Exam: Vitals:   11/23/22 1100 11/23/22 1115 11/23/22 1130 11/23/22 1200  BP: 125/81 111/77 95/62 (!) 98/54  Pulse:  (!) 137 (!) 52 (!) 126  Resp: '16 19 16 14  '$ SpO2: 97% 95% 96% 95%    Physical Exam Constitutional:      General: She is not in acute distress.    Appearance: Normal appearance.  HENT:     Head: Normocephalic and atraumatic.     Mouth/Throat:     Mouth: Mucous membranes are moist.     Pharynx: Oropharynx is clear.  Eyes:     Extraocular Movements: Extraocular movements intact.     Pupils: Pupils are equal, round, and reactive to light.  Cardiovascular:     Rate and Rhythm: Tachycardia present. Rhythm irregular.     Pulses: Normal pulses.     Heart sounds: Normal heart sounds.  Pulmonary:     Effort: Pulmonary effort is normal. No respiratory distress.     Breath sounds: Rales (Trace basilar) present.  Abdominal:     General: Bowel sounds are normal. There is no distension.     Palpations: Abdomen  is soft.     Tenderness: There is no abdominal tenderness.  Musculoskeletal:        General: No swelling or deformity.  Skin:    General: Skin is warm and dry.  Neurological:     General: No focal deficit present.     Mental Status: Mental status is at baseline.    Labs on Admission: I have personally reviewed following labs and imaging studies  CBC: Recent Labs  Lab 11/23/22 1010 11/23/22 1033  WBC 9.0  --   NEUTROABS 5.3  --   HGB 9.0* 9.9*  HCT 30.6* 29.0*  MCV 81.4  --   PLT 236  --  Basic Metabolic Panel: Recent Labs  Lab 11/23/22 1010 11/23/22 1033  NA 135 138  K 4.2 4.4  CL 101 105  CO2 21*  --   GLUCOSE 196* 196*  BUN 20 20  CREATININE 1.54* 1.70*  CALCIUM 8.4*  --    GFR: CrCl cannot be calculated (Unknown ideal weight.).  Liver Function Tests: Recent Labs  Lab 11/23/22 1010  AST 41  ALT 11  ALKPHOS 48  BILITOT 0.4  PROT 7.1  ALBUMIN 3.0*   Urine analysis:    Component Value Date/Time   COLORURINE YELLOW 01/18/2021 1059   APPEARANCEUR CLEAR 01/18/2021 1059   LABSPEC 1.013 01/18/2021 1059   PHURINE 6.0 01/18/2021 [place] 01/18/2021 1059   HGBUR NEGATIVE 01/18/2021 [place] 01/18/2021 1059   KETONESUR NEGATIVE 01/18/2021 1059   PROTEINUR NEGATIVE 01/18/2021 1059   UROBILINOGEN 0.2 06/12/2011 1201   NITRITE NEGATIVE 01/18/2021 1059   LEUKOCYTESUR NEGATIVE 01/18/2021 1059   Radiological Exams on Admission: DG Chest Port 1 View  Result Date: 11/23/2022 CLINICAL DATA:  Cough. EXAM: PORTABLE CHEST 1 VIEW COMPARISON:  None Available. FINDINGS: Stable cardiomegaly. The hila and mediastinum are unremarkable. No pneumothorax. The left retrocardiac region is poorly assessed. No obvious acute abnormalities on this study IMPRESSION: The left retrocardiac region is poorly assessed on this study. No acute abnormalities identified on this study. Electronically Signed   By: Dorise Bullion III M.D.   On: 11/23/2022  10:21    EKG: Independently reviewed.  Atrial fibrillation at 117 bpm.  Nonspecific ST changes.  Assessment/Plan Active Problems:   OSA (obstructive sleep apnea)   Type 2 diabetes mellitus (HCC)   HTN (hypertension)   Chronic kidney disease (CKD), stage III (moderate)   GERD (gastroesophageal reflux disease)   Chronic diastolic CHF (congestive heart failure) (HCC)   Permanent atrial fibrillation (HCC)   Chronic pain   Acute renal failure superimposed on stage 3b chronic kidney disease (HCC)   Lactic acidosis   AKI on CKD 3 Elevated lactic acid Low normal blood pressure > Patient presenting with cough and associated symptoms as below.  Noted to have AKI with creatinine elevated to 1.4 from baseline of 1.2.  This is in the setting of nausea vomiting diarrhea, decreased p.o. intake, continued diuretic use. > Received 500 cc of IV fluid in the ED.  Continue with IV fluids overnight.  BNP is pending in the setting of CHF.  Notably did have lactic acid elevated at 4.3 with repeat pending. > Not volume overloaded on exam, did appear dry on initial exam by EDP. - Will monitor on progressive unit overnight given comorbidities of heart failure and atrial fibrillation with elevated heart rate and low normal blood pressure. - Continue with gentle IV fluids - Hold diuretic - Follow-up BNP  Presumed viral URI  Rule out bacterial infection > Patient presenting with 7 days of worsening cough and congestion.  Has had associated nausea vomiting diarrhea but did deny fevers. > Most consistent with viral URI considering constellation of symptoms.  No leukocytosis on CBC.  This for flu COVID and RSV screening negative. > Chest x-ray did not show any acute disease. > Given the degree of her symptoms with tachycardia and elevated lactic acid patient did receive ceftriaxone and azithromycin in the ED. > Will hold off on further antibiotics for now while we add on full respiratory viral panel and  procalcitonin to help prove viral etiology - Supportive care - Check RVP -  Procalcitonin  CHF Hypertension > As above low normal blood pressure in the ED. > Last echo was in May 2023 with EF 50-55%, indeterminate diastolic function, normal RV function. - Holding home Lasix - Plan to continue home metoprolol tomorrow if blood pressure tolerates  Atrial fibrillation > Elevated heart rate in the ED in the setting of suspected dehydration and AKI as above.  If goes in RVR, given blood pressure, would start with IV amiodarone drip. - Continue home metoprolol in the morning - Continue home Eliquis  Diabetes - SSI ADDENDUM - [place] refused SSI - Home Farxiga and Jardiance ordered  Chronic pain - Continue home gabapentin  DVT prophylaxis: Eliquis Code Status:   Full Family Communication:  Attempted to reach daughter by phone, but there was no answer.  Per EDP daughter was with patient earlier in the ED and is aware of admission. Disposition Plan:   Patient is from:  Home  Anticipated DC to:  Home  Anticipated DC date:  1 to 3 days  Anticipated DC barriers: None  Consults called:  None Admission status:  Observation, progressive  Severity of Illness: The appropriate patient status for this patient is OBSERVATION. Observation status is judged to be reasonable and necessary in order to provide the required intensity of service to ensure the patient's safety. The patient's presenting symptoms, physical exam findings, and initial radiographic and laboratory data in the context of their medical condition is felt to place them at decreased risk for further clinical deterioration. Furthermore, it is anticipated that the patient will be medically stable for discharge from the hospital within 2 midnights of admission.   Marcelyn Bruins MD [facility]  How to contact the [facility] Attending or Consulting provider [place] or covering provider during after hours [place], for this patient?    Check the care team in [facility] and look for a) attending/consulting [facility] provider listed and b) the [facility] team listed Log into [email or web address] and use [place]'s universal password to access. If you do not have the password, please contact the hospital operator. Locate the [facility] provider you are looking for under [facility] and page to a number that you can be directly reached. If you still have difficulty reaching the provider, please page the [facility] (Director on Call) for the Hospitalists listed on amion for assistance.  11/23/2022, 1:42 PM

## 2022-11-24 ENCOUNTER — Encounter (HOSPITAL_COMMUNITY): Payer: Self-pay | Admitting: Internal Medicine

## 2022-11-24 DIAGNOSIS — E872 Acidosis, unspecified: Secondary | ICD-10-CM | POA: Diagnosis not present

## 2022-11-24 DIAGNOSIS — I5032 Chronic diastolic (congestive) heart failure: Secondary | ICD-10-CM | POA: Diagnosis not present

## 2022-11-24 DIAGNOSIS — I4821 Permanent atrial fibrillation: Secondary | ICD-10-CM | POA: Diagnosis not present

## 2022-11-24 DIAGNOSIS — N179 Acute kidney failure, unspecified: Secondary | ICD-10-CM | POA: Diagnosis not present

## 2022-11-24 LAB — RESPIRATORY PANEL BY PCR

## 2022-11-24 LAB — CBC
HCT: 27.1 % — ABNORMAL LOW (ref 36.0–46.0)
Hemoglobin: 7.6 g/dL — ABNORMAL LOW (ref 12.0–15.0)
MCH: 23.2 pg — ABNORMAL LOW (ref 26.0–34.0)
MCHC: 28 g/dL — ABNORMAL LOW (ref 30.0–36.0)
MCV: 82.6 fL (ref 80.0–100.0)
Platelets: 194 10*3/uL (ref 150–400)
RBC: 3.28 MIL/uL — ABNORMAL LOW (ref 3.87–5.11)
RDW: 20.4 % — ABNORMAL HIGH (ref 11.5–15.5)
WBC: 7 10*3/uL (ref 4.0–10.5)
nRBC: 0 % (ref 0.0–0.2)

## 2022-11-24 LAB — COMPREHENSIVE METABOLIC PANEL
ALT: 10 U/L (ref 0–44)
AST: 26 U/L (ref 15–41)
Albumin: 2.6 g/dL — ABNORMAL LOW (ref 3.5–5.0)
Alkaline Phosphatase: 40 U/L (ref 38–126)
Anion gap: 7 (ref 5–15)
BUN: 19 mg/dL (ref 8–23)
CO2: 22 mmol/L (ref 22–32)
Calcium: 7.8 mg/dL — ABNORMAL LOW (ref 8.9–10.3)
Chloride: 106 mmol/L (ref 98–111)
Creatinine, Ser: 1.36 mg/dL — ABNORMAL HIGH (ref 0.44–1.00)
GFR, Estimated: 38 mL/min — ABNORMAL LOW (ref >60)
Glucose, Bld: 128 mg/dL — ABNORMAL HIGH (ref 70–99)
Potassium: 3.4 mmol/L — ABNORMAL LOW (ref 3.5–5.1)
Sodium: 135 mmol/L (ref 135–145)
Total Bilirubin: 0.6 mg/dL (ref 0.3–1.2)
Total Protein: 6.2 g/dL — ABNORMAL LOW (ref 6.5–8.1)

## 2022-11-24 LAB — CBG MONITORING, ED
Glucose-Capillary: 126 mg/dL — ABNORMAL HIGH (ref 70–99)
Glucose-Capillary: 178 mg/dL — ABNORMAL HIGH (ref 70–99)
Glucose-Capillary: 146 mg/dL — ABNORMAL HIGH (ref 70–99)

## 2022-11-24 LAB — LACTIC ACID, PLASMA: Lactic Acid, Venous: 3.1 mmol/L (ref 0.5–1.9)

## 2022-11-24 LAB — GLUCOSE, CAPILLARY: Glucose-Capillary: 146 mg/dL — ABNORMAL HIGH (ref 70–99)

## 2022-11-24 LAB — HEMOGLOBIN AND HEMATOCRIT, BLOOD
HCT: 26.6 % — ABNORMAL LOW (ref 36.0–46.0)
Hemoglobin: 7.9 g/dL — ABNORMAL LOW (ref 12.0–15.0)

## 2022-11-24 LAB — PROCALCITONIN: Procalcitonin: 0.21 ng/mL

## 2022-11-24 MED ORDER — SODIUM CHLORIDE 0.9 % IV SOLN: INTRAVENOUS | Status: DC

## 2022-11-24 MED ORDER — TRAMADOL HCL 50 MG PO TABS
50.0000 mg | ORAL_TABLET | Freq: Two times a day (BID) | ORAL | Status: DC | PRN
  Administered 2022-11-24 – 2022-11-25 (×2): 50 mg via ORAL
  Filled 2022-11-24 (×2): qty 1

## 2022-11-24 MED ORDER — POTASSIUM CHLORIDE CRYS ER 20 MEQ PO TBCR
40.0000 meq | EXTENDED_RELEASE_TABLET | Freq: Once | ORAL | Status: AC
  Administered 2022-11-24: 40 meq via ORAL
  Filled 2022-11-24: qty 2

## 2022-11-24 MED ORDER — FOLIC ACID 1 MG PO TABS
1000.0000 ug | ORAL_TABLET | Freq: Every day | ORAL | Status: DC
  Administered 2022-11-24 – 2022-11-25 (×2): 1 mg via ORAL
  Filled 2022-11-24 (×2): qty 1

## 2022-11-24 NOTE — ED Notes (Signed)
ED TO INPATIENT HANDOFF REPORT  ED Nurse Name and Phone #: Sherryll Burger RN [phone]  S Name/Age/Gender Anne Roach 89 y.o. female Room/Bed: 008C/008C  Code Status   Code Status: Full Code  Home/SNF/Other Home Patient oriented to: self, place, time, and situation Is this baseline? Yes   Triage Complete: Triage complete  Chief Complaint AKI (acute kidney injury) ([place]) [N17.9]  Triage Note Pt was BIB by [facility] from home. Pt has had a worsening productive cough for the past week. Pt is experiencing pain with coughing and taking deep breaths. Pt has developed Nausea, Vomiting, and diarrhea. EMS witnessed yellow productive sputum.   Pt has hx of CHF, DM, and Afib   EMS stated pt was warm to the touch and clammy. Pt has been taking Theraflu. Pt received 50 ml of NS. Pt has 20g in LFA.   Pt primarily speaks arabic, but can speak a little bi of english.   LVS  114/70  120-135 in afib  Cbg 239  95 RA     Allergies No Known Allergies  Level of Care/Admitting Diagnosis ED Disposition     ED Disposition  Admit   Condition  --   Comment  Hospital Area: [place] [[id number]]  Level of Care: Progressive [102]  Admit to Progressive based on following criteria: MULTISYSTEM THREATS such as stable sepsis, metabolic/electrolyte imbalance with or without encephalopathy that is responding to early treatment.  May place patient in observation at [facility] or [facility] if equivalent level of care is available:: No  Covid Evaluation: Confirmed COVID Negative  Diagnosis: AKI (acute kidney injury) [facility]) [[id number]]  Admitting Physician: Marcelyn Bruins [[id number]]  Attending Physician: Marcelyn Bruins [[id number]]          B Medical/Surgery History Past Medical History:  Diagnosis Date   Acute on chronic diastolic CHF (congestive heart failure) ([place]) 03/27/2022   Atrial fibrillation ([place])    Atrial fibrillation with RVR ([place]) 03/20/2017   Chronic diastolic  CHF (congestive heart failure) ([place]) 10/31/2020   DM (diabetes mellitus) ([place])    HTN (hypertension)    PNA (pneumonia) 10/31/2020   Sepsis secondary to UTI ([place]) 07/11/2020   Tremor    Past Surgical History:  Procedure Laterality Date   APPENDECTOMY     CARPAL TUNNEL RELEASE     CATARACT EXTRACTION Bilateral    HERNIA REPAIR       A IV Location/Drains/Wounds Patient Lines/Drains/Airways Status     Active Line/Drains/Airways     Name Placement date Placement time Site Days   Peripheral IV 11/23/22 20 G Anterior;Left Forearm 11/23/22  0900  Forearm  1   Pressure Injury 03/21/17 Stage I -  Intact skin with non-blanchable redness of a localized area usually over a bony prominence. 03/21/17  0757  -- 2074            Intake/Output Last 24 hours  Intake/Output Summary (Last 24 hours) at 11/24/2022 1542 Last data filed at 11/24/2022 0418 Gross per 24 hour  Intake 1000 ml  Output --  Net 1000 ml    Labs/Imaging Results for orders placed or performed during the hospital encounter of 11/23/22 (from the past 48 hour(s))  Resp panel by RT-PCR (RSV, Flu A&B, Covid) Anterior Nasal Swab     Status: None   Collection Time: 11/23/22  9:52 AM   Specimen: Anterior Nasal Swab  Result Value Ref Range   SARS Coronavirus 2 by RT PCR NEGATIVE NEGATIVE  Comment: (NOTE) SARS-CoV-2 target nucleic acids are NOT DETECTED.  The SARS-CoV-2 RNA is generally detectable in upper respiratory specimens during the acute phase of infection. The lowest concentration of SARS-CoV-2 viral copies this assay can detect is 138 copies/mL. A negative result does not preclude SARS-Cov-2 infection and should not be used as the sole basis for treatment or other patient management decisions. A negative result may occur with  improper specimen collection/handling, submission of specimen other than nasopharyngeal swab, presence of viral mutation(s) within the areas targeted by this assay, and inadequate number  of viral copies(<138 copies/mL). A negative result must be combined with clinical observations, patient history, and epidemiological information. The expected result is Negative.  Fact Sheet for Patients:  EntrepreneurPulse.com.au  Fact Sheet for Healthcare Providers:  IncredibleEmployment.be  This test is no t yet approved or cleared by the [place] FDA and  has been authorized for detection and/or diagnosis of SARS-CoV-2 by FDA under an Emergency Use Authorization (EUA). This EUA will remain  in effect (meaning this test can be used) for the duration of the COVID-19 declaration under Section 564(b)(1) of the Act, 21 U.S.C.section 360bbb-3(b)(1), unless the authorization is terminated  or revoked sooner.       Influenza A by PCR NEGATIVE NEGATIVE   Influenza B by PCR NEGATIVE NEGATIVE    Comment: (NOTE) The Xpert Xpress SARS-CoV-2/FLU/RSV plus assay is intended as an aid in the diagnosis of influenza from Nasopharyngeal swab specimens and should not be used as a sole basis for treatment. Nasal washings and aspirates are unacceptable for Xpert Xpress SARS-CoV-2/FLU/RSV testing.  Fact Sheet for Patients: EntrepreneurPulse.com.au  Fact Sheet for Healthcare Providers: IncredibleEmployment.be  This test is not yet approved or cleared by the [place] FDA and has been authorized for detection and/or diagnosis of SARS-CoV-2 by FDA under an Emergency Use Authorization (EUA). This EUA will remain in effect (meaning this test can be used) for the duration of the COVID-19 declaration under Section 564(b)(1) of the Act, 21 U.S.C. section 360bbb-3(b)(1), unless the authorization is terminated or revoked.     Resp Syncytial Virus by PCR NEGATIVE NEGATIVE    Comment: (NOTE) Fact Sheet for Patients: EntrepreneurPulse.com.au  Fact Sheet for Healthcare  Providers: IncredibleEmployment.be  This test is not yet approved or cleared by the [place] FDA and has been authorized for detection and/or diagnosis of SARS-CoV-2 by FDA under an Emergency Use Authorization (EUA). This EUA will remain in effect (meaning this test can be used) for the duration of the COVID-19 declaration under Section 564(b)(1) of the Act, 21 U.S.C. section 360bbb-3(b)(1), unless the authorization is terminated or revoked.  Performed at [facility], [place] [address] [place], [place] [address]   CBC with Differential     Status: Abnormal   Collection Time: 11/23/22 10:10 AM  Result Value Ref Range   WBC 9.0 4.0 - 10.5 K/uL   RBC 3.76 (L) 3.87 - 5.11 MIL/uL   Hemoglobin 9.0 (L) 12.0 - 15.0 g/dL   HCT 30.6 (L) 36.0 - 46.0 %   MCV 81.4 80.0 - 100.0 fL   MCH 23.9 (L) 26.0 - 34.0 pg   MCHC 29.4 (L) 30.0 - 36.0 g/dL   RDW 20.3 (H) 11.5 - 15.5 %   Platelets 236 150 - 400 K/uL   nRBC 0.0 0.0 - 0.2 %   Neutrophils Relative % 59 %   Neutro Abs 5.3 1.7 - 7.7 K/uL   Lymphocytes Relative 27 %   Lymphs Abs  2.4 0.7 - 4.0 K/uL   Monocytes Relative 9 %   Monocytes Absolute 0.8 0.1 - 1.0 K/uL   Eosinophils Relative 3 %   Eosinophils Absolute 0.3 0.0 - 0.5 K/uL   Basophils Relative 1 %   Basophils Absolute 0.1 0.0 - 0.1 K/uL   Immature Granulocytes 1 %   Abs Immature Granulocytes 0.10 (H) 0.00 - 0.07 K/uL    Comment: Performed at [facility] [address], [place], [place] [address]  Comprehensive metabolic panel     Status: Abnormal   Collection Time: 11/23/22 10:10 AM  Result Value Ref Range   Sodium 135 135 - 145 mmol/L   Potassium 4.2 3.5 - 5.1 mmol/L   Chloride 101 98 - 111 mmol/L   CO2 21 (L) 22 - 32 mmol/L   Glucose, Bld 196 (H) 70 - 99 mg/dL    Comment: Glucose reference range applies only to samples taken after fasting for at least 8 hours.   BUN 20 8 - 23 mg/dL   Creatinine, Ser 1.54 (H) 0.44 - 1.00 mg/dL   Calcium 8.4  (L) 8.9 - 10.3 mg/dL   Total Protein 7.1 6.5 - 8.1 g/dL   Albumin 3.0 (L) 3.5 - 5.0 g/dL   AST 41 15 - 41 U/L   ALT 11 0 - 44 U/L   Alkaline Phosphatase 48 38 - 126 U/L   Total Bilirubin 0.4 0.3 - 1.2 mg/dL   GFR, Estimated 32 (L) >60 mL/min    Comment: (NOTE) Calculated using the CKD-EPI Creatinine Equation (2021)    Anion gap 13 5 - 15    Comment: Performed at [facility], [place] [address], [place], [place] [address]  Lactic acid, plasma     Status: Abnormal   Collection Time: 11/23/22 10:10 AM  Result Value Ref Range   Lactic Acid, Venous 4.3 (HH) 0.5 - 1.9 mmol/L    Comment: CRITICAL RESULT CALLED TO, READ BACK BY Terri Piedra, RN AND VERIFIED WITH W SMITH AT [phone] ON 1 7 2024 Performed at [facility], [place] [address] [place], [place] [address]   Troponin I (High Sensitivity)     Status: None   Collection Time: 11/23/22 10:10 AM  Result Value Ref Range   Troponin I (High Sensitivity) 11 <18 ng/L    Comment: (NOTE) Elevated high sensitivity troponin I (hsTnI) values and significant  changes across serial measurements may suggest ACS but many other  chronic and acute conditions are known to elevate hsTnI results.  Refer to the "Links" section for chest pain algorithms and additional  guidance. Performed at [facility], [place] [address], [place],  [address]   I-stat chem 8, ED     Status: Abnormal   Collection Time: 11/23/22 10:33 AM  Result Value Ref Range   Sodium 138 135 - 145 mmol/L   Potassium 4.4 3.5 - 5.1 mmol/L   Chloride 105 98 - 111 mmol/L   BUN 20 8 - 23 mg/dL   Creatinine, Ser 1.70 (H) 0.44 - 1.00 mg/dL   Glucose, Bld 196 (H) 70 - 99 mg/dL    Comment: Glucose reference range applies only to samples taken after fasting for at least 8 hours.   Calcium, Ion 1.09 (L) 1.15 - 1.40 mmol/L   TCO2 22 22 - 32 mmol/L   Hemoglobin 9.9 (L) 12.0 - 15.0 g/dL   HCT 29.0 (L) 36.0 - 46.0 %  Culture, blood (routine x 2)  Status: None (Preliminary  result)   Collection Time: 11/23/22 11:50 AM   Specimen: BLOOD  Result Value Ref Range   Specimen Description BLOOD SITE NOT SPECIFIED    Special Requests      BOTTLES DRAWN AEROBIC AND ANAEROBIC Blood Culture adequate volume   Culture      NO GROWTH < 24 HOURS Performed at [facility], [place] [address] [place], [place] [address]    Report Status PENDING   Troponin I (High Sensitivity)     Status: None   Collection Time: 11/23/22 11:52 AM  Result Value Ref Range   Troponin I (High Sensitivity) 10 <18 ng/L    Comment: (NOTE) Elevated high sensitivity troponin I (hsTnI) values and significant  changes across serial measurements may suggest ACS but many other  chronic and acute conditions are known to elevate hsTnI results.  Refer to the "Links" section for chest pain algorithms and additional  guidance. Performed at [facility], [place] [address], [place], [place] [address]   Culture, blood (routine x 2)     Status: None (Preliminary result)   Collection Time: 11/23/22 11:57 AM   Specimen: BLOOD RIGHT HAND  Result Value Ref Range   Specimen Description BLOOD RIGHT HAND    Special Requests      BOTTLES DRAWN AEROBIC ONLY Blood Culture results may not be optimal due to an inadequate volume of blood received in culture bottles   Culture      NO GROWTH < 24 HOURS Performed at [facility] [address] [place], [place] [address]    Report Status PENDING   Urinalysis, Routine w reflex microscopic     Status: Abnormal   Collection Time: 11/23/22  1:15 PM  Result Value Ref Range   Color, Urine STRAW (A) YELLOW   APPearance HAZY (A) CLEAR   Specific Gravity, Urine 1.006 1.005 - 1.030   pH 5.0 5.0 - 8.0   Glucose, UA >=500 (A) NEGATIVE mg/dL   Hgb urine dipstick NEGATIVE NEGATIVE   Bilirubin Urine NEGATIVE NEGATIVE   Ketones, ur NEGATIVE NEGATIVE mg/dL   Protein, ur NEGATIVE NEGATIVE mg/dL   Nitrite NEGATIVE NEGATIVE   Leukocytes,Ua LARGE (A) NEGATIVE   RBC  / HPF 0-5 0 - 5 RBC/hpf   WBC, UA >50 (H) 0 - 5 WBC/hpf   Bacteria, UA MANY (A) NONE SEEN   Squamous Epithelial / HPF 0-5 0 - 5 /HPF   WBC Clumps PRESENT    Mucus PRESENT    Hyaline Casts, UA PRESENT     Comment: Performed at [facility], [place] [address] [place], [place] [address]  Urine Culture     Status: Abnormal (Preliminary result)   Collection Time: 11/23/22  1:15 PM   Specimen: Urine, Clean Catch  Result Value Ref Range   Specimen Description URINE, CLEAN CATCH    Special Requests      NONE Performed at [facility], [place] [address], [place], [place] [address]    Culture 30,000 COLONIES/mL ESCHERICHIA COLI (A)    Report Status PENDING   CBG monitoring, ED     Status: Abnormal   Collection Time: 11/23/22  5:10 PM  Result Value Ref Range   Glucose-Capillary 145 (H) 70 - 99 mg/dL    Comment: Glucose reference range applies only to samples taken after fasting for at least 8 hours.  CBG monitoring, ED     Status: Abnormal   Collection Time: 11/24/22  1:36 AM  Result Value Ref Range   Glucose-Capillary 126 (H) 70 - 99 mg/dL    Comment: Glucose reference range applies only to samples taken after fasting for at least 8 hours.   Comment 1 Notify RN    Comment 2 Document in Chart   Comprehensive metabolic panel     Status: Abnormal   Collection Time: 11/24/22  3:06 AM  Result Value Ref Range   Sodium 135 135 - 145 mmol/L   Potassium 3.4 (L) 3.5 - 5.1 mmol/L   Chloride 106 98 - 111 mmol/L   CO2 22 22 - 32 mmol/L   Glucose, Bld 128 (H) 70 - 99 mg/dL    Comment: Glucose reference range applies only to samples taken after fasting for at least 8 hours.   BUN 19 8 - 23 mg/dL   Creatinine, Ser 1.36 (H) 0.44 - 1.00 mg/dL   Calcium 7.8 (L) 8.9 - 10.3 mg/dL   Total Protein 6.2 (L) 6.5 - 8.1 g/dL   Albumin 2.6 (L) 3.5 - 5.0 g/dL   AST 26 15 - 41 U/L   ALT 10 0 - 44 U/L   Alkaline Phosphatase 40 38 - 126 U/L   Total Bilirubin 0.6 0.3 - 1.2 mg/dL   GFR, Estimated 38 (L)  >60 mL/min    Comment: (NOTE) Calculated using the CKD-EPI Creatinine Equation (2021)    Anion gap 7 5 - 15    Comment: Performed at [facility], [place] [address], [place], [place] [address]  CBC     Status: Abnormal   Collection Time: 11/24/22  3:06 AM  Result Value Ref Range   WBC 7.0 4.0 - 10.5 K/uL   RBC 3.28 (L) 3.87 - 5.11 MIL/uL   Hemoglobin 7.6 (L) 12.0 - 15.0 g/dL   HCT 27.1 (L) 36.0 - 46.0 %   MCV 82.6 80.0 - 100.0 fL   MCH 23.2 (L) 26.0 - 34.0 pg   MCHC 28.0 (L) 30.0 - 36.0 g/dL   RDW 20.4 (H) 11.5 - 15.5 %   Platelets 194 150 - 400 K/uL   nRBC 0.0 0.0 - 0.2 %    Comment: Performed at [facility], [place] [address] [place], [place] [address]  Procalcitonin     Status: None   Collection Time: 11/24/22  3:06 AM  Result Value Ref Range   Procalcitonin 0.21 ng/mL    Comment:        Interpretation: PCT (Procalcitonin) <= 0.5 ng/mL: Systemic infection (sepsis) is not likely. Local bacterial infection is possible. (NOTE)       Sepsis PCT Algorithm           Lower Respiratory Tract                                      Infection PCT Algorithm    ----------------------------     ----------------------------         PCT < 0.25 ng/mL                PCT < 0.10 ng/mL          Strongly encourage             Strongly discourage   discontinuation of antibiotics    initiation of antibiotics    ----------------------------     -----------------------------       PCT 0.25 - 0.50 ng/mL  PCT 0.10 - 0.25 ng/mL               OR       >80% decrease in PCT            Discourage initiation of                                            antibiotics      Encourage discontinuation           of antibiotics    ----------------------------     -----------------------------         PCT >= 0.50 ng/mL              PCT 0.26 - 0.50 ng/mL               AND        <80% decrease in PCT             Encourage initiation of                                             antibiotics        Encourage continuation           of antibiotics    ----------------------------     -----------------------------        PCT >= 0.50 ng/mL                  PCT > 0.50 ng/mL               AND         increase in PCT                  Strongly encourage                                      initiation of antibiotics    Strongly encourage escalation           of antibiotics                                     -----------------------------                                           PCT <= 0.25 ng/mL                                                 OR                                        > 80% decrease in PCT  Discontinue / Do not initiate                                             antibiotics  Performed at [facility], [place] [address], [place], [place] [address]   CBG monitoring, ED     Status: Abnormal   Collection Time: 11/24/22  9:01 AM  Result Value Ref Range   Glucose-Capillary 178 (H) 70 - 99 mg/dL    Comment: Glucose reference range applies only to samples taken after fasting for at least 8 hours.  CBG monitoring, ED     Status: Abnormal   Collection Time: 11/24/22 12:01 PM  Result Value Ref Range   Glucose-Capillary 146 (H) 70 - 99 mg/dL    Comment: Glucose reference range applies only to samples taken after fasting for at least 8 hours.   DG Chest Port 1 View  Result Date: 11/23/2022 CLINICAL DATA:  Cough. EXAM: PORTABLE CHEST 1 VIEW COMPARISON:  None Available. FINDINGS: Stable cardiomegaly. The hila and mediastinum are unremarkable. No pneumothorax. The left retrocardiac region is poorly assessed. No obvious acute abnormalities on this study IMPRESSION: The left retrocardiac region is poorly assessed on this study. No acute abnormalities identified on this study. Electronically Signed   By: Dorise Bullion III M.D.   On: 11/23/2022 10:21    Pending Labs Unresulted Labs (From admission, onward)     Start     Ordered   11/24/22  1400  Hemoglobin and hematocrit, blood  Once,   R        11/24/22 0744   11/24/22 0745  Lactic acid, plasma  Once,   R        11/24/22 0744   11/24/22 0500  Procalcitonin  Daily,   R      11/23/22 1337   11/23/22 2151  C Difficile Quick Screen w PCR reflex  (C Difficile quick screen w PCR reflex panel )  Once, for 24 hours,   TIMED       References:    CDiff Information Tool   11/23/22 2150   11/23/22 1338  Procalcitonin - Baseline  ONCE - URGENT,   URGENT        11/23/22 1337   11/23/22 1257  Respiratory (~20 pathogens) panel by PCR  (Respiratory panel by PCR (~20 pathogens, ~24 hr TAT)  w precautions)  Add-on,   AD        11/23/22 1257   11/23/22 1257  Brain natriuretic peptide  ONCE - STAT,   STAT        11/23/22 1257   11/23/22 0952  Lactic acid, plasma  Now then every 2 hours,   R      11/23/22 0952            Vitals/Pain Today's Vitals   11/24/22 1345 11/24/22 1420 11/24/22 1440 11/24/22 1450  BP:      Pulse: (!) 104 86  96  Resp: '19 19  17  '$ Temp:   98.7 F (37.1 C)   TempSrc:   Oral   SpO2: 97% 99%  97%  PainSc:        Isolation Precautions Enteric precautions (UV disinfection)  Medications Medications  metoprolol succinate (TOPROL-XL) 24 hr tablet 50 mg (50 mg Oral Given 11/24/22 1048)  sodium chloride flush (NS) 0.9 % injection 3 mL (3 mLs Intravenous Given  11/24/22 1051)  0.9 %  sodium chloride infusion (0 mLs Intravenous Stopped 11/24/22 0418)  acetaminophen (TYLENOL) tablet 650 mg (650 mg Oral Given 11/24/22 1047)    Or  acetaminophen (TYLENOL) suppository 650 mg ( Rectal See Alternative 11/24/22 1047)  polyethylene glycol (MIRALAX / GLYCOLAX) packet 17 g (has no administration in time range)  apixaban (ELIQUIS) tablet 5 mg (5 mg Oral Given 11/24/22 1049)  allopurinol (ZYLOPRIM) tablet 100 mg (100 mg Oral Given 11/24/22 1145)  donepezil (ARICEPT) tablet 5 mg (5 mg Oral Given 11/23/22 2159)  gabapentin (NEURONTIN) capsule 400 mg (400 mg Oral Given 11/24/22 1047)   dapagliflozin propanediol (FARXIGA) tablet 10 mg (10 mg Oral Given 11/24/22 1145)  acetaminophen (TYLENOL) tablet 325 mg (0 mg Oral Hold 12/22/93 6387)  folic acid (FOLVITE) tablet 1 mg (1 mg Oral Given 11/24/22 1049)  traMADol (ULTRAM) tablet 50 mg (has no administration in time range)  sodium chloride 0.9 % bolus 500 mL (0 mLs Intravenous Stopped 11/23/22 1100)  cefTRIAXone (ROCEPHIN) 1 g in sodium chloride 0.9 % 100 mL IVPB (0 g Intravenous Stopped 11/23/22 1217)  azithromycin (ZITHROMAX) 500 mg in sodium chloride 0.9 % 250 mL IVPB (0 mg Intravenous Stopped 11/23/22 1325)  potassium chloride SA (KLOR-CON M) CR tablet 40 mEq (40 mEq Oral Given 11/24/22 1048)    Mobility walks Low fall risk   Focused Assessments    R Recommendations: See Admitting Provider Note  Report given to:   Additional Notes:

## 2022-11-24 NOTE — Progress Notes (Signed)
PROGRESS NOTE    Anne Roach  JEH:[id number] DOB: 07/13/1933 DOA: 11/23/2022 PCP: Lin Landsman, MD   Brief Narrative:  HPI: Anne Roach is a 89 y.o. female with medical history significant of diabetes, atrial fibrillation, OSA, CKD 3, hypertension, GERD, macular degeneration, chronic pain, CHF presenting with cough and associated symptoms.   History obtained with assistance of chart review, family, translation.  Patient reporting about a week of gradually worsening cough and congestion.  Has had associated nausea vomiting and diarrhea as well.  Reports subjective fever but not measured.  Also reports some chest pressure sensation as well as shortness of breath.   Denies chills, abdominal pain, constipation   ED Course: Vital signs in the ED significant for blood pressure in the 37C to 588 systolic, heart rate in the 100s to 130s.  Lab workup included CMP with BUN of 21, creatinine elevated to 1.54 from baseline around 1.2, glucose 196, calcium 8.4, albumin 3.0.  CBC with hemoglobin stable at 9.0.  Lactic acid elevated to 4.3, repeat pending.  Troponin negative x 2.  BMP pending.  Respiratory panel for flu COVID RSV negative.  Urinalysis and urine culture pending.  Chest x-ray showed retrocardiac poorly assessed but otherwise no acute abnormality.  Patient received ceftriaxone and azithromycin in the ED as well as 500 cc of IV fluids.  Assessment & Plan:   Active Problems:   OSA (obstructive sleep apnea)   Type 2 diabetes mellitus (HCC)   HTN (hypertension)   Chronic kidney disease (CKD), stage III (moderate)   GERD (gastroesophageal reflux disease)   Chronic diastolic CHF (congestive heart failure) (HCC)   Permanent atrial fibrillation (HCC)   Chronic pain   Acute renal failure superimposed on stage 3b chronic kidney disease (HCC)   Lactic acidosis   AKI on CKD 3a/dehydration/lactic acidosis/low normal blood pressure: Patient's baseline creatinine appears to be around 1.1-1.2,  presented with 1.  5 4, improved to 1.36.  Could be due to dehydration since she also had low normal blood pressure and lactic acidosis.  She received some IV fluids, lactic acid repeat testing is pending.  Follow BMP and lactic acid and further decide on more IV fluids.  Hypokalemia: Replace.  Presumed viral URI: She is ruled out of influenza, COVID as well as RSV, procalcitonin unremarkable almost rules out bacterial infection, waiting for rest of the respiratory viral panel to come back.  She is feeling better other than having cough.  She has some coarse breath sounds with rhonchi but she is not hypoxic.  Will not treat with antibiotics.  Treat symptomatically and with supportive care.  Left-sided chest pain/costochondritis: Patient complains of chest pain and has point tenderness, likely costochondritis in the setting of cough.  Treat with pain medications.  Troponins negative.   Chronic diastolic CHF > Last echo was in May 2023 with EF 50-55%, indeterminate diastolic function, normal RV function. - Holding home Lasix, resume beta-blocker.  Permanent atrial fibrillation > Elevated heart rate in the ED in the setting of suspected dehydration and AKI as above.  Resume Toprol-XL and Eliquis.     Diabetes melitis type II: Reportedly patient refused insulin so her Wilder Glade and Vania Rea was resumed.  Chronic pain - Continue home gabapentin  DVT prophylaxis: Eliquis   Code Status: Full Code  Family Communication: Daughter present at bedside.  Plan of care discussed with patient in length and he/she verbalized understanding and agreed with it.  Status is: Observation The patient will require care spanning >  2 midnights and should be moved to inpatient because: Patient still symptomatic with elevated creatinine.   Estimated body mass index is 28.02 kg/m as calculated from the following:   Height as of 10/29/22: '5\' 6"'$  (1.676 m).   Weight as of 10/29/22: 78.7 kg.  Pressure Injury 03/21/17  Stage I -  Intact skin with non-blanchable redness of a localized area usually over a bony prominence. (Active)  03/21/17 0757  Location: Buttocks  Location Orientation: Right;Left  Staging: Stage I -  Intact skin with non-blanchable redness of a localized area usually over a bony prominence.  Wound Description (Comments):   Present on Admission: Yes   Nutritional Assessment: There is no height or weight on file to calculate BMI.. Seen by dietician.  I agree with the assessment and plan as outlined below: Nutrition Status:        . Skin Assessment: I have examined the patient's skin and I agree with the wound assessment as performed by the wound care RN as outlined below: Pressure Injury 03/21/17 Stage I -  Intact skin with non-blanchable redness of a localized area usually over a bony prominence. (Active)  03/21/17 0757  Location: Buttocks  Location Orientation: Right;Left  Staging: Stage I -  Intact skin with non-blanchable redness of a localized area usually over a bony prominence.  Wound Description (Comments):   Present on Admission: Yes    Consultants:  None  Procedures:  None  Antimicrobials:  Anti-infectives (From admission, onward)    Start     Dose/Rate Route Frequency Ordered Stop   11/23/22 1130  cefTRIAXone (ROCEPHIN) 1 g in sodium chloride 0.9 % 100 mL IVPB        1 g 200 mL/hr over 30 Minutes Intravenous  Once 11/23/22 1128 11/23/22 1217   11/23/22 1130  azithromycin (ZITHROMAX) 500 mg in sodium chloride 0.9 % 250 mL IVPB        500 mg 250 mL/hr over 60 Minutes Intravenous  Once 11/23/22 1128 11/23/22 1325         Subjective: Patient seen and examined, she feels overall better than yesterday.  Still has cough and left-sided anterior chest pain.  Objective: Vitals:   11/24/22 0600 11/24/22 0730 11/24/22 0738 11/24/22 1008  BP: (!) 116/57 111/79    Pulse: 99 89 86   Resp: '15 18 20   '$ Temp:    98.2 F (36.8 C)  TempSrc:    Oral  SpO2: 96% 96%  97%     Intake/Output Summary (Last 24 hours) at 11/24/2022 1208 Last data filed at 11/24/2022 0418 Gross per 24 hour  Intake 1000 ml  Output --  Net 1000 ml   There were no vitals filed for this visit.  Examination:  General exam: Appears calm and comfortable  Respiratory system: Coarse breath sounds with rhonchi, point tenderness at the left anterior chest. Respiratory effort normal. Cardiovascular system: S1 & S2 heard, RRR. No JVD, murmurs, rubs, gallops or clicks. No pedal edema. Gastrointestinal system: Abdomen is nondistended, soft and nontender. No organomegaly or masses felt. Normal bowel sounds heard. Central nervous system: Alert and oriented. No focal neurological deficits. Extremities: Symmetric 5 x 5 power. Skin: No rashes, lesions or ulcers Psychiatry: Judgement and insight appear normal. Mood & affect appropriate.    Data Reviewed: I have personally reviewed following labs and imaging studies  CBC: Recent Labs  Lab 11/23/22 1010 11/23/22 1033 11/24/22 0306  WBC 9.0  --  7.0  NEUTROABS 5.3  --   --  HGB 9.0* 9.9* 7.6*  HCT 30.6* 29.0* 27.1*  MCV 81.4  --  82.6  PLT 236  --  948   Basic Metabolic Panel: Recent Labs  Lab 11/23/22 1010 11/23/22 1033 11/24/22 0306  NA 135 138 135  K 4.2 4.4 3.4*  CL 101 105 106  CO2 21*  --  22  GLUCOSE 196* 196* 128*  BUN '20 20 19  '$ CREATININE 1.54* 1.70* 1.36*  CALCIUM 8.4*  --  7.8*   GFR: CrCl cannot be calculated (Unknown ideal weight.). Liver Function Tests: Recent Labs  Lab 11/23/22 1010 11/24/22 0306  AST 41 26  ALT 11 10  ALKPHOS 48 40  BILITOT 0.4 0.6  PROT 7.1 6.2*  ALBUMIN 3.0* 2.6*   No results for input(s): "LIPASE", "AMYLASE" in the last 168 hours. No results for input(s): "AMMONIA" in the last 168 hours. Coagulation Profile: No results for input(s): "INR", "PROTIME" in the last 168 hours. Cardiac Enzymes: No results for input(s): "CKTOTAL", "CKMB", "CKMBINDEX", "TROPONINI" in the last 168  hours. BNP (last 3 results) No results for input(s): "PROBNP" in the last 8760 hours. HbA1C: No results for input(s): "HGBA1C" in the last 72 hours. CBG: Recent Labs  Lab 11/23/22 1710 11/24/22 0136 11/24/22 0901 11/24/22 1201  GLUCAP 145* 126* 178* 146*   Lipid Profile: No results for input(s): "CHOL", "HDL", "LDLCALC", "TRIG", "CHOLHDL", "LDLDIRECT" in the last 72 hours. Thyroid Function Tests: No results for input(s): "TSH", "T4TOTAL", "FREET4", "T3FREE", "THYROIDAB" in the last 72 hours. Anemia Panel: No results for input(s): "VITAMINB12", "FOLATE", "FERRITIN", "TIBC", "IRON", "RETICCTPCT" in the last 72 hours. Sepsis Labs: Recent Labs  Lab 11/23/22 1010 11/24/22 0306  PROCALCITON  --  0.21  LATICACIDVEN 4.3*  --     Recent Results (from the past 240 hour(s))  Resp panel by RT-PCR (RSV, Flu A&B, Covid) Anterior Nasal Swab     Status: None   Collection Time: 11/23/22  9:52 AM   Specimen: Anterior Nasal Swab  Result Value Ref Range Status   SARS Coronavirus 2 by RT PCR NEGATIVE NEGATIVE Final    Comment: (NOTE) SARS-CoV-2 target nucleic acids are NOT DETECTED.  The SARS-CoV-2 RNA is generally detectable in upper respiratory specimens during the acute phase of infection. The lowest concentration of SARS-CoV-2 viral copies this assay can detect is 138 copies/mL. A negative result does not preclude SARS-Cov-2 infection and should not be used as the sole basis for treatment or other patient management decisions. A negative result may occur with  improper specimen collection/handling, submission of specimen other than nasopharyngeal swab, presence of viral mutation(s) within the areas targeted by this assay, and inadequate number of viral copies(<138 copies/mL). A negative result must be combined with clinical observations, patient history, and epidemiological information. The expected result is Negative.  Fact Sheet for Patients:   EntrepreneurPulse.com.au  Fact Sheet for Healthcare Providers:  IncredibleEmployment.be  This test is no t yet approved or cleared by the [place] FDA and  has been authorized for detection and/or diagnosis of SARS-CoV-2 by FDA under an Emergency Use Authorization (EUA). This EUA will remain  in effect (meaning this test can be used) for the duration of the COVID-19 declaration under Section 564(b)(1) of the Act, 21 U.S.C.section 360bbb-3(b)(1), unless the authorization is terminated  or revoked sooner.       Influenza A by PCR NEGATIVE NEGATIVE Final   Influenza B by PCR NEGATIVE NEGATIVE Final    Comment: (NOTE) The Xpert Xpress SARS-CoV-2/FLU/RSV plus assay is  intended as an aid in the diagnosis of influenza from Nasopharyngeal swab specimens and should not be used as a sole basis for treatment. Nasal washings and aspirates are unacceptable for Xpert Xpress SARS-CoV-2/FLU/RSV testing.  Fact Sheet for Patients: EntrepreneurPulse.com.au  Fact Sheet for Healthcare Providers: IncredibleEmployment.be  This test is not yet approved or cleared by the [place] FDA and has been authorized for detection and/or diagnosis of SARS-CoV-2 by FDA under an Emergency Use Authorization (EUA). This EUA will remain in effect (meaning this test can be used) for the duration of the COVID-19 declaration under Section 564(b)(1) of the Act, 21 U.S.C. section 360bbb-3(b)(1), unless the authorization is terminated or revoked.     Resp Syncytial Virus by PCR NEGATIVE NEGATIVE Final    Comment: (NOTE) Fact Sheet for Patients: EntrepreneurPulse.com.au  Fact Sheet for Healthcare Providers: IncredibleEmployment.be  This test is not yet approved or cleared by the [place] FDA and has been authorized for detection and/or diagnosis of SARS-CoV-2 by FDA under an Emergency Use  Authorization (EUA). This EUA will remain in effect (meaning this test can be used) for the duration of the COVID-19 declaration under Section 564(b)(1) of the Act, 21 U.S.C. section 360bbb-3(b)(1), unless the authorization is terminated or revoked.  Performed at [facility], [place] [address] [place], [place] [address]   Culture, blood (routine x 2)     Status: None (Preliminary result)   Collection Time: 11/23/22 11:50 AM   Specimen: BLOOD  Result Value Ref Range Status   Specimen Description BLOOD SITE NOT SPECIFIED  Final   Special Requests   Final    BOTTLES DRAWN AEROBIC AND ANAEROBIC Blood Culture adequate volume   Culture   Final    NO GROWTH < 24 HOURS Performed at [facility], [place] [address] [place], [place] [address]    Report Status PENDING  Incomplete  Culture, blood (routine x 2)     Status: None (Preliminary result)   Collection Time: 11/23/22 11:57 AM   Specimen: BLOOD RIGHT HAND  Result Value Ref Range Status   Specimen Description BLOOD RIGHT HAND  Final   Special Requests   Final    BOTTLES DRAWN AEROBIC ONLY Blood Culture results may not be optimal due to an inadequate volume of blood received in culture bottles   Culture   Final    NO GROWTH < 24 HOURS Performed at [facility], [place] [address] [place], [place] [address]    Report Status PENDING  Incomplete  Urine Culture     Status: Abnormal (Preliminary result)   Collection Time: 11/23/22  1:15 PM   Specimen: Urine, Clean Catch  Result Value Ref Range Status   Specimen Description URINE, CLEAN CATCH  Final   Special Requests   Final    NONE Performed at [facility], [place] [address] [place], [place] [address]    Culture 30,000 COLONIES/mL GRAM NEGATIVE RODS (A)  Final   Report Status PENDING  Incomplete     Radiology Studies: DG Chest Port 1 View  Result Date: 11/23/2022 CLINICAL DATA:  Cough. EXAM: PORTABLE CHEST 1 VIEW COMPARISON:  None Available. FINDINGS: Stable  cardiomegaly. The hila and mediastinum are unremarkable. No pneumothorax. The left retrocardiac region is poorly assessed. No obvious acute abnormalities on this study IMPRESSION: The left retrocardiac region is poorly assessed on this study. No acute abnormalities identified on this study. Electronically Signed   By: Dorise Bullion III M.D.  On: 11/23/2022 10:21    Scheduled Meds:  acetaminophen  325 mg Oral Once   allopurinol  100 mg Oral Daily   apixaban  5 mg Oral BID   betamethasone acetate-betamethasone sodium phosphate  3 mg Intramuscular Once   dapagliflozin propanediol  10 mg Oral Daily   donepezil  5 mg Oral QHS   folic acid  6,060 mcg Oral Daily   gabapentin  400 mg Oral TID   metoprolol succinate  50 mg Oral Daily   sodium chloride flush  3 mL Intravenous Q12H   Continuous Infusions:   LOS: 0 days   Darliss Cheney, MD [facility]  11/24/2022, 12:08 PM   *Please note that this is a verbal dictation therefore any spelling or grammatical errors are due to the "[employer] One" system interpretation.  Please page via [place] and do not message via secure chat for urgent patient care matters. Secure chat can be used for non urgent patient care matters.  How to contact the [facility] Attending or Consulting provider [place] or covering provider during after hours [place], for this patient?  Check the care team in [facility] and look for a) attending/consulting TRH provider listed and b) the [facility] team listed. Page or secure chat [phone]. Log into [email or web address] and use [place]'s universal password to access. If you do not have the password, please contact the hospital operator. Locate the [facility] provider you are looking for under [facility] and page to a number that you can be directly reached. If you still have difficulty reaching the provider, please page the [facility] (Director on Call) for the Hospitalists listed on amion for assistance.

## 2022-11-25 ENCOUNTER — Other Ambulatory Visit: Payer: Medicare HMO

## 2022-11-25 DIAGNOSIS — Z79899 Other long term (current) drug therapy: Secondary | ICD-10-CM | POA: Diagnosis not present

## 2022-11-25 DIAGNOSIS — Z7984 Long term (current) use of oral hypoglycemic drugs: Secondary | ICD-10-CM | POA: Diagnosis not present

## 2022-11-25 DIAGNOSIS — J069 Acute upper respiratory infection, unspecified: Secondary | ICD-10-CM | POA: Diagnosis present

## 2022-11-25 DIAGNOSIS — I130 Hypertensive heart and chronic kidney disease with heart failure and stage 1 through stage 4 chronic kidney disease, or unspecified chronic kidney disease: Secondary | ICD-10-CM | POA: Diagnosis present

## 2022-11-25 DIAGNOSIS — E860 Dehydration: Secondary | ICD-10-CM | POA: Diagnosis present

## 2022-11-25 DIAGNOSIS — H3530 Unspecified macular degeneration: Secondary | ICD-10-CM | POA: Diagnosis present

## 2022-11-25 DIAGNOSIS — Z7901 Long term (current) use of anticoagulants: Secondary | ICD-10-CM | POA: Diagnosis not present

## 2022-11-25 DIAGNOSIS — N179 Acute kidney failure, unspecified: Secondary | ICD-10-CM | POA: Diagnosis present

## 2022-11-25 DIAGNOSIS — Z825 Family history of asthma and other chronic lower respiratory diseases: Secondary | ICD-10-CM | POA: Diagnosis not present

## 2022-11-25 DIAGNOSIS — M940 Chondrocostal junction syndrome [Tietze]: Secondary | ICD-10-CM | POA: Diagnosis present

## 2022-11-25 DIAGNOSIS — I5032 Chronic diastolic (congestive) heart failure: Secondary | ICD-10-CM | POA: Diagnosis present

## 2022-11-25 DIAGNOSIS — I4821 Permanent atrial fibrillation: Secondary | ICD-10-CM | POA: Diagnosis present

## 2022-11-25 DIAGNOSIS — E1122 Type 2 diabetes mellitus with diabetic chronic kidney disease: Secondary | ICD-10-CM | POA: Diagnosis present

## 2022-11-25 DIAGNOSIS — Z833 Family history of diabetes mellitus: Secondary | ICD-10-CM | POA: Diagnosis not present

## 2022-11-25 DIAGNOSIS — K219 Gastro-esophageal reflux disease without esophagitis: Secondary | ICD-10-CM | POA: Diagnosis present

## 2022-11-25 DIAGNOSIS — L89311 Pressure ulcer of right buttock, stage 1: Secondary | ICD-10-CM | POA: Diagnosis present

## 2022-11-25 DIAGNOSIS — G8929 Other chronic pain: Secondary | ICD-10-CM | POA: Diagnosis present

## 2022-11-25 DIAGNOSIS — E876 Hypokalemia: Secondary | ICD-10-CM | POA: Diagnosis present

## 2022-11-25 DIAGNOSIS — L89321 Pressure ulcer of left buttock, stage 1: Secondary | ICD-10-CM | POA: Diagnosis present

## 2022-11-25 DIAGNOSIS — N1832 Chronic kidney disease, stage 3b: Secondary | ICD-10-CM | POA: Diagnosis present

## 2022-11-25 DIAGNOSIS — E8722 Chronic metabolic acidosis: Secondary | ICD-10-CM | POA: Diagnosis present

## 2022-11-25 DIAGNOSIS — Z1152 Encounter for screening for COVID-19: Secondary | ICD-10-CM | POA: Diagnosis not present

## 2022-11-25 DIAGNOSIS — G4733 Obstructive sleep apnea (adult) (pediatric): Secondary | ICD-10-CM | POA: Diagnosis present

## 2022-11-25 LAB — CBC WITH DIFFERENTIAL/PLATELET
Abs Immature Granulocytes: 0.06 10*3/uL (ref 0.00–0.07)
Basophils Absolute: 0.1 10*3/uL (ref 0.0–0.1)
Basophils Relative: 1 %
Eosinophils Absolute: 0.2 10*3/uL (ref 0.0–0.5)
Eosinophils Relative: 3 %
HCT: 28 % — ABNORMAL LOW (ref 36.0–46.0)
Hemoglobin: 8.1 g/dL — ABNORMAL LOW (ref 12.0–15.0)
Immature Granulocytes: 1 %
Lymphocytes Relative: 26 %
Lymphs Abs: 2 10*3/uL (ref 0.7–4.0)
MCH: 23.3 pg — ABNORMAL LOW (ref 26.0–34.0)
MCHC: 28.9 g/dL — ABNORMAL LOW (ref 30.0–36.0)
MCV: 80.7 fL (ref 80.0–100.0)
Monocytes Absolute: 0.8 10*3/uL (ref 0.1–1.0)
Monocytes Relative: 10 %
Neutro Abs: 4.7 10*3/uL (ref 1.7–7.7)
Neutrophils Relative %: 59 %
Platelets: 214 10*3/uL (ref 150–400)
RBC: 3.47 MIL/uL — ABNORMAL LOW (ref 3.87–5.11)
RDW: 20.6 % — ABNORMAL HIGH (ref 11.5–15.5)
WBC: 7.9 10*3/uL (ref 4.0–10.5)
nRBC: 0.3 % — ABNORMAL HIGH (ref 0.0–0.2)

## 2022-11-25 LAB — URINE CULTURE: Culture: 30000 — AB

## 2022-11-25 LAB — BASIC METABOLIC PANEL
Anion gap: 8 (ref 5–15)
BUN: 14 mg/dL (ref 8–23)
CO2: 22 mmol/L (ref 22–32)
Calcium: 8.2 mg/dL — ABNORMAL LOW (ref 8.9–10.3)
Chloride: 106 mmol/L (ref 98–111)
Creatinine, Ser: 1.12 mg/dL — ABNORMAL HIGH (ref 0.44–1.00)
GFR, Estimated: 48 mL/min — ABNORMAL LOW (ref >60)
Glucose, Bld: 135 mg/dL — ABNORMAL HIGH (ref 70–99)
Potassium: 4.1 mmol/L (ref 3.5–5.1)
Sodium: 136 mmol/L (ref 135–145)

## 2022-11-25 LAB — LACTIC ACID, PLASMA
Lactic Acid, Venous: 2.4 mmol/L (ref 0.5–1.9)
Lactic Acid, Venous: 3.7 mmol/L (ref 0.5–1.9)

## 2022-11-25 LAB — GLUCOSE, CAPILLARY
Glucose-Capillary: 139 mg/dL — ABNORMAL HIGH (ref 70–99)
Glucose-Capillary: 203 mg/dL — ABNORMAL HIGH (ref 70–99)

## 2022-11-25 LAB — MAGNESIUM: Magnesium: 2.3 mg/dL (ref 1.7–2.4)

## 2022-11-25 LAB — PROCALCITONIN: Procalcitonin: <0.1 ng/mL

## 2022-11-25 MED ORDER — CAPSAICIN 0.025 % EX CREA
TOPICAL_CREAM | Freq: Two times a day (BID) | CUTANEOUS | Status: DC
  Filled 2022-11-25: qty 60

## 2022-11-25 NOTE — Evaluation (Signed)
Physical Therapy Evaluation Patient Details Name: Anne Roach MRN: [id number] DOB: September 06, 1933 Today's Date: 11/25/2022  History of Present Illness  89 yo female presenting with cough and associated symptoms over a week gradually getting worse with associated nausea, vomiting/diarrhea and shortness of breath. PMH DM, A fib, OSA, CKD 3, HTN, GERD, macular degeneration, chronic pain, CHF.  Clinical Impression  Patient presents with decreased mobility due to pain in knees, decreased balance, decreased strength and limited activity tolerance.  Currently patient needing minguard A for safety with ambulation in room and hallway.  Daughter reports patient previously toileting on her own at home.  Recommended BSC and stable lighting, non-slip footwear for safety to toilet at night.  Patient encouraged to call for assistance if not feeling well to avoid fugure falls.  Pt will continue to follow if not d/c.  Recommending HHPT at d/c.      Recommendations for follow up therapy are one component of a multi-disciplinary discharge planning process, led by the attending physician.  Recommendations may be updated based on patient status, additional functional criteria and insurance authorization.  Follow Up Recommendations Home health PT      Assistance Recommended at Discharge Frequent or constant Supervision/Assistance  Patient can return home with the following  A little help with walking and/or transfers;Assistance with cooking/housework;Assist for transportation;Help with stairs or ramp for entrance;A little help with bathing/dressing/bathroom    Equipment Recommendations None recommended by PT  Recommendations for Other Services       Functional Status Assessment Patient has had a recent decline in their functional status and demonstrates the ability to make significant improvements in function in a reasonable and predictable amount of time.     Precautions / Restrictions  Precautions Precautions: Fall Precaution Comments: chronic knee pain.  admits to 4 recent falls. Restrictions Weight Bearing Restrictions: No      Mobility  Bed Mobility Overal bed mobility: Modified Independent                  Transfers Overall transfer level: Needs assistance Equipment used: Rolling walker (2 wheels) Transfers: Sit to/from Stand Sit to Stand: Min guard           General transfer comment: assist for safety for balance    Ambulation/Gait Ambulation/Gait assistance: Min guard Gait Distance (Feet): 50 Feet Assistive device: Rolling walker (2 wheels) Gait Pattern/deviations: Step-through pattern, Wide base of support, Trunk flexed, Decreased stride length, Shuffle       General Gait Details: tends to get walker too far in front, cues for keeping feet inside or stepping inside walker and for more upright posture, assist for balance and safety, limited due to pain; VSS throughout  Stairs            Wheelchair Mobility    Modified Rankin (Stroke Patients Only)       Balance Overall balance assessment: Needs assistance   Sitting balance-Leahy Scale: Good     Standing balance support: Bilateral upper extremity supported, Reliant on assistive device for balance Standing balance-Leahy Scale: Poor Standing balance comment: UE support for balance                             Pertinent Vitals/Pain Pain Assessment Faces Pain Scale: Hurts even more Pain Location: knee and lower legs Pain Descriptors / Indicators: Tender, Sore, Aching, Tingling Pain Intervention(s): Monitored during session, Limited activity within patient's tolerance, Repositioned    Home Living Family/patient expects  to be discharged to:: Private residence Living Arrangements: Children Available Help at Discharge: Family;Available PRN/intermittently;Personal care attendant Type of Home: House Home Access: Level entry;Ramped entrance     Alternate Level  Stairs-Number of Steps: 13 Home Layout: Multi-level;Able to live on main level with bedroom/bathroom Home Equipment: Rolling Walker (2 wheels);Grab bars - tub/shower;Shower seat - built in;BSC/3in1;Rollator (4 wheels);Hand held shower head;Other (comment) Additional Comments: Life Alert, HHA M, Tu, Th, Fr, and everyother Sat for about 2 hours/day.    Prior Function Prior Level of Function : Needs assist             Mobility Comments: Walks household distances with RW; three recent falls ADLs Comments: Daughter has begun providing more assist with ADL, and patient does occasionally participate with light IADL at times.  Daughter assists with med and community mobility.     Hand Dominance   Dominant Hand: Right    Extremity/Trunk Assessment   Upper Extremity Assessment Upper Extremity Assessment: Defer to OT evaluation    Lower Extremity Assessment Lower Extremity Assessment: RLE deficits/detail;LLE deficits/detail RLE Deficits / Details: AROM WFL, strength hip flexion 3/5, knee extension 4/5, ankle DF 4/5 RLE Sensation: decreased light touch;history of peripheral neuropathy LLE Deficits / Details: AROM WFL, strength hip flexion 3-/5, knee extension 4-/5, ankle DF 4/5 LLE Sensation: decreased light touch;history of peripheral neuropathy    Cervical / Trunk Assessment Cervical / Trunk Assessment: Kyphotic  Communication   Communication: Prefers language other than English;Interpreter utilized (speaks some [place], daughter interprets when difficulty understanding, Arabic/[place]ian)  Cognition Arousal/Alertness: Awake/alert Behavior During Therapy: WFL for tasks assessed/performed Overall Cognitive Status: Within Functional Limits for tasks assessed                                 General Comments: not formally assessed        General Comments General comments (skin integrity, edema, etc.): daughter present and supportive, confirmed information from prior  session with OT    Exercises     Assessment/Plan    PT Assessment Patient needs continued PT services  PT Problem List Decreased strength;Decreased knowledge of precautions;Decreased balance;Decreased activity tolerance;Decreased knowledge of use of DME       PT Treatment Interventions DME instruction;Functional mobility training;Balance training;Patient/family education;Therapeutic activities;Gait training;Therapeutic exercise    PT Goals (Current goals can be found in the Care Plan section)  Acute Rehab PT Goals Patient Stated Goal: to return to independent, decrease falls PT Goal Formulation: With patient/family Time For Goal Achievement: 12/09/22 Potential to Achieve Goals: Fair    Frequency Min 3X/week     Co-evaluation               AM-PAC PT "6 Clicks" Mobility  Outcome Measure Help needed turning from your back to your side while in a flat bed without using bedrails?: None Help needed moving from lying on your back to sitting on the side of a flat bed without using bedrails?: A Little Help needed moving to and from a bed to a chair (including a wheelchair)?: A Little Help needed standing up from a chair using your arms (e.g., wheelchair or bedside chair)?: A Little Help needed to walk in hospital room?: A Little Help needed climbing 3-5 steps with a railing? : Total 6 Click Score: 17    End of Session Equipment Utilized During Treatment: Gait belt Activity Tolerance: Patient tolerated treatment well Patient left: in bed;with family/visitor present  PT Visit Diagnosis: Other abnormalities of gait and mobility (R26.89);Muscle weakness (generalized) (M62.81);Pain Pain - Right/Left:  (both) Pain - part of body: Knee    Time: 1255-1320 PT Time Calculation (min) (ACUTE ONLY): 25 min   Charges:   PT Evaluation $PT Eval Moderate Complexity: 1 Mod PT Treatments $Gait Training: 8-22 mins        Magda Kiel, PT Acute Rehabilitation  Services Office:[phone] 11/25/2022   Reginia Naas 11/25/2022, 1:25 PM

## 2022-11-25 NOTE — TOC Transition Note (Signed)
Transition of Care [facility]) - CM/SW Discharge Note   Patient Details  Name: Anne Roach MRN: [id number] Date of Birth: 23-Jun-1933  Transition [facility]) CM/SW Contact:  Carles Collet, RN Phone Number: 11/25/2022, 2:18 PM   Clinical Narrative:     Damaris Schooner w patient's daughter over the phone. Discussed recommendations for DC, and they are active with [facility], I reached out to liaison to add PT after DC. Daughter confirms that they have all needed DME for home.  No other TOC needs identified at this time  Final next level of care: Home w Home Health Services Barriers to Discharge: No Barriers Identified   Patient Goals and CMS Choice CMS Medicare.gov Compare Post Acute Care list provided to:: Other (Comment Required) Choice offered to / list presented to : Adult Children  Discharge Placement                         Discharge Plan and Services Additional resources added to the After Visit Summary for                            [facility] Arranged: PT HH Agency: [facility] [place] Date [facility] Agency Contacted: 11/25/22 Time [place]: [id number] Representative spoke with at [place]: [facility] Determinants of Health ([place]) Interventions SDOH Screenings   Food Insecurity: No Food Insecurity (11/24/2022)  Housing: Low Risk  (11/24/2022)  Transportation Needs: No Transportation Needs (11/24/2022)  Utilities: Not At Risk (11/24/2022)  Alcohol Screen: Low Risk  (03/28/2022)  Financial Resource Strain: Low Risk  (03/28/2022)  Tobacco Use: Low Risk  (11/24/2022)     Readmission Risk Interventions     No data to display

## 2022-11-25 NOTE — Progress Notes (Signed)
Discharge paperwork reviewed with pt and pt's daughter at bedside. Both verbalized understanding. Pt alert and oriented x 4 in no acute distress upon discharge. Pt has taken all belongings with her. Pt taken down via wheelchair to ED entrance by this nurse. Pt's daughter transported home.

## 2022-11-25 NOTE — Discharge Summary (Signed)
Physician Discharge Summary  Anne Roach ZTI:[id number] DOB: December 31, 1932 DOA: 11/23/2022  PCP: Lin Landsman, MD  Admit date: 11/23/2022 Discharge date: 11/25/2022 30 Day Unplanned Readmission Risk Score    Flowsheet Row ED to Hosp-Admission (Current) from 11/23/2022 in [facility]  30 Day Unplanned Readmission Risk Score (%) 16.9 Filed at 11/25/2022 1200       This score is the patient's risk of an unplanned readmission within 30 days of being discharged (0 -100%). The score is based on dignosis, age, lab data, medications, orders, and past utilization.   Low:  0-14.9   Medium: 15-21.9   High: 22-29.9   Extreme: 30 and above          Admitted From: Home Disposition: Home  Recommendations for Outpatient Follow-up:  Follow up with PCP in 1-2 weeks Please obtain BMP/CBC in one week Please follow up with your PCP on the following pending results:  Home health ordered: Yes Diet: Cardiac Code: Full code  Subjective: Seen and examined.  Complains of chronic lower extremity pain but otherwise no complaints.  No shortness of breath.  HPI: Anne Roach is a 89 y.o. female with medical history significant of diabetes, atrial fibrillation, OSA, CKD 3, hypertension, GERD, macular degeneration, chronic pain, CHF presenting with cough and associated symptoms.   History obtained with assistance of chart review, family, translation.  Patient reporting about a week of gradually worsening cough and congestion.  Has had associated nausea vomiting and diarrhea as well.  Reports subjective fever but not measured.  Also reports some chest pressure sensation as well as shortness of breath.   Denies chills, abdominal pain, constipation   ED Course: Vital signs in the ED significant for blood pressure in the 82N to 053 systolic, heart rate in the 100s to 130s.  Lab workup included CMP with BUN of 21, creatinine elevated to 1.54 from baseline around 1.2, glucose 196, calcium  8.4, albumin 3.0.  CBC with hemoglobin stable at 9.0.  Lactic acid elevated to 4.3, repeat pending.  Troponin negative x 2.  BMP pending.  Respiratory panel for flu COVID RSV negative.  Urinalysis and urine culture pending.  Chest x-ray showed retrocardiac poorly assessed but otherwise no acute abnormality.  Patient received ceftriaxone and azithromycin in the ED as well as 500 cc of IV fluids.  Brief/Interim Summary: Patient was hospitalized briefly under hospital service.  Details below.  AKI on CKD 3a/dehydration/lactic acidosis/low normal blood pressure: Patient's baseline creatinine appears to be around 1.1-1.2, presented with 1.  5 4, improved to 1.12 with IV fluids.   Hypokalemia: Resolved.   Presumed viral URI: She is ruled out of influenza, COVID as well as RSV, and respiratory viral panel were all negative, procalcitonin unremarkable almost rules out bacterial infection, she was treated medically with supportive care.  Feeling better, not hypoxic.   Left-sided chest pain/costochondritis: Patient complains of chest pain and has point tenderness, likely costochondritis in the setting of cough.  Treated with pain medications.  Troponins negative.   Chronic diastolic CHF > Last echo was in May 2023 with EF 50-55%, indeterminate diastolic function, normal RV function. - Holding home Lasix, resume beta-blocker.   Permanent atrial fibrillation > Elevated heart rate in the ED in the setting of suspected dehydration and AKI as above.  Resume Toprol-XL and Eliquis.     Diabetes melitis type II: Reportedly patient refused insulin so her Wilder Glade and Vania Rea was resumed.   Chronic pain - Continue home  gabapentin  Generalized weakness: Seen by PT OT, PT recommending home with PT.  Which is ordered.  Chronic lactic acidosis: After chart review, it appears that patient always have had elevated lactic acid.  She clinically does not appear to be dehydrated anymore.  AKI also  resolved.  Discharge plan was discussed with patient and/or family member and they verbalized understanding and agreed with it.  Discharge Diagnoses:  Active Problems:   OSA (obstructive sleep apnea)   Type 2 diabetes mellitus (HCC)   HTN (hypertension)   Chronic kidney disease (CKD), stage III (moderate)   GERD (gastroesophageal reflux disease)   Chronic diastolic CHF (congestive heart failure) (HCC)   Permanent atrial fibrillation (HCC)   Chronic pain   Acute renal failure superimposed on stage 3b chronic kidney disease (HCC)   Lactic acidosis   AKI (acute kidney injury) ([place])    Discharge Instructions   Allergies as of 11/25/2022   No Known Allergies      Medication List     STOP taking these medications    diclofenac Sodium 1 % Gel Commonly known as: VOLTAREN   ferrous sulfate 325 (65 FE) MG tablet   pantoprazole 40 MG tablet Commonly known as: PROTONIX       TAKE these medications    Accu-Chek Aviva Plus test strip Generic drug: glucose blood   acetaminophen 500 MG tablet Commonly known as: TYLENOL Take 1,000 mg by mouth every 6 (six) hours as needed for mild pain or headache.   allopurinol 100 MG tablet Commonly known as: ZYLOPRIM TAKE 1 TABLET(100 MG) BY MOUTH DAILY What changed: See the new instructions.   donepezil 5 MG tablet Commonly known as: ARICEPT Take 5 mg by mouth at bedtime.   Eliquis 5 MG Tabs tablet Generic drug: apixaban TAKE 1 TABLET(5 MG) BY MOUTH TWICE DAILY What changed: See the new instructions.   empagliflozin 10 MG Tabs tablet Commonly known as: JARDIANCE Take 1 tablet (10 mg total) by mouth daily.   Farxiga 10 MG Tabs tablet Generic drug: dapagliflozin propanediol Take 10 mg by mouth daily.   folic acid 283 MCG tablet Commonly known as: FOLVITE Take 800 mcg by mouth daily.   furosemide 40 MG tablet Commonly known as: LASIX TAKE 1 TABLET(40 MG) BY MOUTH DAILY What changed: See the new instructions.    gabapentin 400 MG capsule Commonly known as: NEURONTIN Take 400 mg by mouth 3 (three) times daily.   ICAPS AREDS 2 PO Take 1 capsule by mouth 2 (two) times daily.   metoprolol succinate 50 MG 24 hr tablet Commonly known as: TOPROL-XL Take 1 tablet (50 mg total) by mouth daily. Take with or immediately following a meal.   Potassium Chloride ER 20 MEQ Tbcr Take 20 mEq by mouth daily.   traMADol 50 MG tablet Commonly known as: ULTRAM Take 1 tablet (50 mg total) by mouth every 12 (twelve) hours as needed. What changed: reasons to take this   Vitamin D 125 MCG (5000 UT) Caps Take 5,000 Units by mouth daily with breakfast.        Follow-up Information     Lin Landsman, MD Follow up in 1 week(s).   Specialty: Family Medicine Contact information: [place] [address] [phone]                No Known Allergies  Consultations: None   Procedures/Studies: DG Chest Port 1 View  Result Date: 11/23/2022 CLINICAL DATA:  Cough. EXAM: PORTABLE CHEST 1  VIEW COMPARISON:  None Available. FINDINGS: Stable cardiomegaly. The hila and mediastinum are unremarkable. No pneumothorax. The left retrocardiac region is poorly assessed. No obvious acute abnormalities on this study IMPRESSION: The left retrocardiac region is poorly assessed on this study. No acute abnormalities identified on this study. Electronically Signed   By: Dorise Bullion III M.D.   On: 11/23/2022 10:21   DG ESOPHAGUS W SINGLE CM (SOL OR THIN BA)  Result Date: 10/31/2022 CLINICAL DATA:  Dysphagia. EXAM: ESOPHOGRAM/BARIUM SWALLOW TECHNIQUE: Single contrast examination was performed using  thin barium. FLUOROSCOPY: Radiation Exposure Index (as provided by the fluoroscopic device): 17.99 mGy Kerma COMPARISON:  None Available. FINDINGS: No definite mass is noted in the esophagus. Extensive tertiary contractions are noted in the middle and distal esophagus suggesting presbyesophagus. Severe persistent  narrowing of gastroesophageal junction is noted suggesting stricture. 13 mm barium tablet was administered and was seen to be lodged in the distal esophagus, not passing into the stomach. IMPRESSION: Extensive tertiary contractions are noted in the middle and distal esophagus suggesting presbyesophagus. Severe persistent narrowing of gastroesophageal junction is noted suggesting stricture. 13 mm barium tablet did not pass into stomach. Electronically Signed   By: Marijo Conception M.D.   On: 10/31/2022 10:10     Discharge Exam: Vitals:   11/25/22 0800 11/25/22 1237  BP: (!) 121/90 (!) 126/94  Pulse: 95 (!) 111  Resp: 19 19  Temp:  98.8 F (37.1 C)  SpO2: 94% 98%   Vitals:   11/25/22 0320 11/25/22 0726 11/25/22 0800 11/25/22 1237  BP: 128/68 (!) 120/94 (!) 121/90 (!) 126/94  Pulse: 92 (!) 105 95 (!) 111  Resp: '16 17 19 19  '$ Temp: 98.1 F (36.7 C) 98.3 F (36.8 C)  98.8 F (37.1 C)  TempSrc: Oral Oral  Oral  SpO2: 95% 93% 94% 98%  Weight:      Height:        General: Pt is alert, awake, not in acute distress Cardiovascular: RRR, S1/S2 +, no rubs, no gallops Respiratory: CTA bilaterally, no wheezing, no rhonchi Abdominal: Soft, NT, ND, bowel sounds + Extremities: no edema, no cyanosis    The results of significant diagnostics from this hospitalization (including imaging, microbiology, ancillary and laboratory) are listed below for reference.     Microbiology: Recent Results (from the past 240 hour(s))  Resp panel by RT-PCR (RSV, Flu A&B, Covid) Anterior Nasal Swab     Status: None   Collection Time: 11/23/22  9:52 AM   Specimen: Anterior Nasal Swab  Result Value Ref Range Status   SARS Coronavirus 2 by RT PCR NEGATIVE NEGATIVE Final    Comment: (NOTE) SARS-CoV-2 target nucleic acids are NOT DETECTED.  The SARS-CoV-2 RNA is generally detectable in upper respiratory specimens during the acute phase of infection. The lowest concentration of SARS-CoV-2 viral copies this assay  can detect is 138 copies/mL. A negative result does not preclude SARS-Cov-2 infection and should not be used as the sole basis for treatment or other patient management decisions. A negative result may occur with  improper specimen collection/handling, submission of specimen other than nasopharyngeal swab, presence of viral mutation(s) within the areas targeted by this assay, and inadequate number of viral copies(<138 copies/mL). A negative result must be combined with clinical observations, patient history, and epidemiological information. The expected result is Negative.  Fact Sheet for Patients:  EntrepreneurPulse.com.au  Fact Sheet for Healthcare Providers:  IncredibleEmployment.be  This test is no t yet approved or cleared by the [place]  States FDA and  has been authorized for detection and/or diagnosis of SARS-CoV-2 by FDA under an Emergency Use Authorization (EUA). This EUA will remain  in effect (meaning this test can be used) for the duration of the COVID-19 declaration under Section 564(b)(1) of the Act, 21 U.S.C.section 360bbb-3(b)(1), unless the authorization is terminated  or revoked sooner.       Influenza A by PCR NEGATIVE NEGATIVE Final   Influenza B by PCR NEGATIVE NEGATIVE Final    Comment: (NOTE) The Xpert Xpress SARS-CoV-2/FLU/RSV plus assay is intended as an aid in the diagnosis of influenza from Nasopharyngeal swab specimens and should not be used as a sole basis for treatment. Nasal washings and aspirates are unacceptable for Xpert Xpress SARS-CoV-2/FLU/RSV testing.  Fact Sheet for Patients: EntrepreneurPulse.com.au  Fact Sheet for Healthcare Providers: IncredibleEmployment.be  This test is not yet approved or cleared by the [place] FDA and has been authorized for detection and/or diagnosis of SARS-CoV-2 by FDA under an Emergency Use Authorization (EUA). This EUA will  remain in effect (meaning this test can be used) for the duration of the COVID-19 declaration under Section 564(b)(1) of the Act, 21 U.S.C. section 360bbb-3(b)(1), unless the authorization is terminated or revoked.     Resp Syncytial Virus by PCR NEGATIVE NEGATIVE Final    Comment: (NOTE) Fact Sheet for Patients: EntrepreneurPulse.com.au  Fact Sheet for Healthcare Providers: IncredibleEmployment.be  This test is not yet approved or cleared by the [place] FDA and has been authorized for detection and/or diagnosis of SARS-CoV-2 by FDA under an Emergency Use Authorization (EUA). This EUA will remain in effect (meaning this test can be used) for the duration of the COVID-19 declaration under Section 564(b)(1) of the Act, 21 U.S.C. section 360bbb-3(b)(1), unless the authorization is terminated or revoked.  Performed at [facility], [place] [address] [place], [place] [address]   Respiratory (~20 pathogens) panel by PCR     Status: None   Collection Time: 11/23/22  9:52 AM   Specimen: Nasopharyngeal Swab; Respiratory  Result Value Ref Range Status   Adenovirus NOT DETECTED NOT DETECTED Final   Coronavirus 229E NOT DETECTED NOT DETECTED Final    Comment: (NOTE) The Coronavirus on the Respiratory Panel, DOES NOT test for the novel  Coronavirus (2019 nCoV)    Coronavirus HKU1 NOT DETECTED NOT DETECTED Final   Coronavirus NL63 NOT DETECTED NOT DETECTED Final   Coronavirus OC43 NOT DETECTED NOT DETECTED Final   Metapneumovirus NOT DETECTED NOT DETECTED Final   Rhinovirus / Enterovirus NOT DETECTED NOT DETECTED Final   Influenza A NOT DETECTED NOT DETECTED Final   Influenza B NOT DETECTED NOT DETECTED Final   Parainfluenza Virus 1 NOT DETECTED NOT DETECTED Final   Parainfluenza Virus 2 NOT DETECTED NOT DETECTED Final   Parainfluenza Virus 3 NOT DETECTED NOT DETECTED Final   Parainfluenza Virus 4 NOT DETECTED NOT DETECTED Final    Respiratory Syncytial Virus NOT DETECTED NOT DETECTED Final   Bordetella pertussis NOT DETECTED NOT DETECTED Final   Bordetella Parapertussis NOT DETECTED NOT DETECTED Final   Chlamydophila pneumoniae NOT DETECTED NOT DETECTED Final   Mycoplasma pneumoniae NOT DETECTED NOT DETECTED Final    Comment: Performed at [facility], [place]. [address], [place],  [address]  Culture, blood (routine x 2)     Status: None (Preliminary result)   Collection Time: 11/23/22 11:50 AM   Specimen: BLOOD  Result Value Ref Range Status   Specimen Description BLOOD SITE NOT SPECIFIED  Final   Special Requests   Final    BOTTLES DRAWN AEROBIC AND ANAEROBIC Blood Culture adequate volume   Culture   Final    NO GROWTH 2 DAYS Performed at [facility], [address], [place] [address], [place] [address]    Report Status PENDING  Incomplete  Culture, blood (routine x 2)     Status: None (Preliminary result)   Collection Time: 11/23/22 11:57 AM   Specimen: BLOOD RIGHT HAND  Result Value Ref Range Status   Specimen Description BLOOD RIGHT HAND  Final   Special Requests   Final    BOTTLES DRAWN AEROBIC ONLY Blood Culture results may not be optimal due to an inadequate volume of blood received in culture bottles   Culture   Final    NO GROWTH 2 DAYS Performed at [facility], [place] [address] [place], [place] [address]    Report Status PENDING  Incomplete  Urine Culture     Status: Abnormal   Collection Time: 11/23/22  1:15 PM   Specimen: Urine, Clean Catch  Result Value Ref Range Status   Specimen Description URINE, CLEAN CATCH  Final   Special Requests   Final    NONE Performed at [facility], [place] [address] [place],  [address]    Culture 30,000 COLONIES/mL ESCHERICHIA COLI (A)  Final   Report Status 11/25/2022 FINAL  Final   Organism ID, Bacteria ESCHERICHIA COLI (A)  Final      Susceptibility   Escherichia coli - MIC*    AMPICILLIN <=2 SENSITIVE Sensitive     CEFAZOLIN  <=4 SENSITIVE Sensitive     CEFEPIME <=0.12 SENSITIVE Sensitive     CEFTRIAXONE <=0.25 SENSITIVE Sensitive     CIPROFLOXACIN <=0.25 SENSITIVE Sensitive     GENTAMICIN <=1 SENSITIVE Sensitive     IMIPENEM <=0.25 SENSITIVE Sensitive     NITROFURANTOIN <=16 SENSITIVE Sensitive     TRIMETH/SULFA <=20 SENSITIVE Sensitive     AMPICILLIN/SULBACTAM <=2 SENSITIVE Sensitive     PIP/TAZO <=4 SENSITIVE Sensitive     * 30,000 COLONIES/mL ESCHERICHIA COLI     Labs: BNP (last 3 results) Recent Labs    03/27/22 0837 04/09/22 1208 04/17/22 1028  BNP 449.7* 1,111.2* 093.2*   Basic Metabolic Panel: Recent Labs  Lab 11/23/22 1010 11/23/22 1033 11/24/22 0306 11/25/22 0914  NA 135 138 135 136  K 4.2 4.4 3.4* 4.1  CL 101 105 106 106  CO2 21*  --  22 22  GLUCOSE 196* 196* 128* 135*  BUN '20 20 19 14  '$ CREATININE 1.54* 1.70* 1.36* 1.12*  CALCIUM 8.4*  --  7.8* 8.2*  MG  --   --   --  2.3   Liver Function Tests: Recent Labs  Lab 11/23/22 1010 11/24/22 0306  AST 41 26  ALT 11 10  ALKPHOS 48 40  BILITOT 0.4 0.6  PROT 7.1 6.2*  ALBUMIN 3.0* 2.6*   No results for input(s): "LIPASE", "AMYLASE" in the last 168 hours. No results for input(s): "AMMONIA" in the last 168 hours. CBC: Recent Labs  Lab 11/23/22 1010 11/23/22 1033 11/24/22 0306 11/24/22 1700 11/25/22 0914  WBC 9.0  --  7.0  --  7.9  NEUTROABS 5.3  --   --   --  4.7  HGB 9.0* 9.9* 7.6* 7.9* 8.1*  HCT 30.6* 29.0* 27.1* 26.6* 28.0*  MCV 81.4  --  82.6  --  80.7  PLT 236  --  194  --  214   Cardiac Enzymes: No results for input(s): "CKTOTAL", "CKMB", "CKMBINDEX", "TROPONINI" in the last 168 hours. BNP: Invalid input(s): "POCBNP" CBG: Recent Labs  Lab 11/24/22 0901 11/24/22 1201 11/24/22 2251 11/25/22 0818 11/25/22 1241  GLUCAP 178* 146* 146* 139* 203*   D-Dimer No results for input(s): "DDIMER" in the last 72 hours. Hgb A1c No results for input(s): "HGBA1C" in the last 72 hours. Lipid Profile No results for  input(s): "CHOL", "HDL", "LDLCALC", "TRIG", "CHOLHDL", "LDLDIRECT" in the last 72 hours. Thyroid function studies No results for input(s): "TSH", "T4TOTAL", "T3FREE", "THYROIDAB" in the last 72 hours.  Invalid input(s): "FREET3" Anemia work up No results for input(s): "VITAMINB12", "FOLATE", "FERRITIN", "TIBC", "IRON", "RETICCTPCT" in the last 72 hours. Urinalysis    Component Value Date/Time   COLORURINE STRAW (A) 11/23/2022 1315   APPEARANCEUR HAZY (A) 11/23/2022 1315   LABSPEC 1.006 11/23/2022 1315   PHURINE 5.0 11/23/2022 1315   GLUCOSEU >=500 (A) 11/23/2022 1315   HGBUR NEGATIVE 11/23/2022 1315   BILIRUBINUR NEGATIVE 11/23/2022 1315   KETONESUR NEGATIVE 11/23/2022 1315   PROTEINUR NEGATIVE 11/23/2022 1315   UROBILINOGEN 0.2 06/12/2011 1201   NITRITE NEGATIVE 11/23/2022 1315   LEUKOCYTESUR LARGE (A) 11/23/2022 1315   Sepsis Labs Recent Labs  Lab 11/23/22 1010 11/24/22 0306 11/25/22 0914  WBC 9.0 7.0 7.9   Microbiology Recent Results (from the past 240 hour(s))  Resp panel by RT-PCR (RSV, Flu A&B, Covid) Anterior Nasal Swab     Status: None   Collection Time: 11/23/22  9:52 AM   Specimen: Anterior Nasal Swab  Result Value Ref Range Status   SARS Coronavirus 2 by RT PCR NEGATIVE NEGATIVE Final    Comment: (NOTE) SARS-CoV-2 target nucleic acids are NOT DETECTED.  The SARS-CoV-2 RNA is generally detectable in upper respiratory specimens during the acute phase of infection. The lowest concentration of SARS-CoV-2 viral copies this assay can detect is 138 copies/mL. A negative result does not preclude SARS-Cov-2 infection and should not be used as the sole basis for treatment or other patient management decisions. A negative result may occur with  improper specimen collection/handling, submission of specimen other than nasopharyngeal swab, presence of viral mutation(s) within the areas targeted by this assay, and inadequate number of viral copies(<138 copies/mL). A  negative result must be combined with clinical observations, patient history, and epidemiological information. The expected result is Negative.  Fact Sheet for Patients:  EntrepreneurPulse.com.au  Fact Sheet for Healthcare Providers:  IncredibleEmployment.be  This test is no t yet approved or cleared by the [place] FDA and  has been authorized for detection and/or diagnosis of SARS-CoV-2 by FDA under an Emergency Use Authorization (EUA). This EUA will remain  in effect (meaning this test can be used) for the duration of the COVID-19 declaration under Section 564(b)(1) of the Act, 21 U.S.C.section 360bbb-3(b)(1), unless the authorization is terminated  or revoked sooner.       Influenza A by PCR NEGATIVE NEGATIVE Final   Influenza B by PCR NEGATIVE NEGATIVE Final    Comment: (NOTE) The Xpert Xpress SARS-CoV-2/FLU/RSV plus assay is intended as an aid in the diagnosis of influenza from Nasopharyngeal swab specimens and should not be used as a sole basis for treatment. Nasal washings and aspirates are unacceptable for Xpert Xpress SARS-CoV-2/FLU/RSV testing.  Fact Sheet for Patients: EntrepreneurPulse.com.au  Fact Sheet for Healthcare Providers: IncredibleEmployment.be  This test is not yet approved or cleared by the [place] FDA and has been authorized for detection and/or diagnosis of  SARS-CoV-2 by FDA under an Emergency Use Authorization (EUA). This EUA will remain in effect (meaning this test can be used) for the duration of the COVID-19 declaration under Section 564(b)(1) of the Act, 21 U.S.C. section 360bbb-3(b)(1), unless the authorization is terminated or revoked.     Resp Syncytial Virus by PCR NEGATIVE NEGATIVE Final    Comment: (NOTE) Fact Sheet for Patients: EntrepreneurPulse.com.au  Fact Sheet for Healthcare  Providers: IncredibleEmployment.be  This test is not yet approved or cleared by the [place] FDA and has been authorized for detection and/or diagnosis of SARS-CoV-2 by FDA under an Emergency Use Authorization (EUA). This EUA will remain in effect (meaning this test can be used) for the duration of the COVID-19 declaration under Section 564(b)(1) of the Act, 21 U.S.C. section 360bbb-3(b)(1), unless the authorization is terminated or revoked.  Performed at [facility], [place] [address], [place], [place] [address]   Respiratory (~20 pathogens) panel by PCR     Status: None   Collection Time: 11/23/22  9:52 AM   Specimen: Nasopharyngeal Swab; Respiratory  Result Value Ref Range Status   Adenovirus NOT DETECTED NOT DETECTED Final   Coronavirus 229E NOT DETECTED NOT DETECTED Final    Comment: (NOTE) The Coronavirus on the Respiratory Panel, DOES NOT test for the novel  Coronavirus (2019 nCoV)    Coronavirus HKU1 NOT DETECTED NOT DETECTED Final   Coronavirus NL63 NOT DETECTED NOT DETECTED Final   Coronavirus OC43 NOT DETECTED NOT DETECTED Final   Metapneumovirus NOT DETECTED NOT DETECTED Final   Rhinovirus / Enterovirus NOT DETECTED NOT DETECTED Final   Influenza A NOT DETECTED NOT DETECTED Final   Influenza B NOT DETECTED NOT DETECTED Final   Parainfluenza Virus 1 NOT DETECTED NOT DETECTED Final   Parainfluenza Virus 2 NOT DETECTED NOT DETECTED Final   Parainfluenza Virus 3 NOT DETECTED NOT DETECTED Final   Parainfluenza Virus 4 NOT DETECTED NOT DETECTED Final   Respiratory Syncytial Virus NOT DETECTED NOT DETECTED Final   Bordetella pertussis NOT DETECTED NOT DETECTED Final   Bordetella Parapertussis NOT DETECTED NOT DETECTED Final   Chlamydophila pneumoniae NOT DETECTED NOT DETECTED Final   Mycoplasma pneumoniae NOT DETECTED NOT DETECTED Final    Comment: Performed at [facility], [place]. [address], [place], [place] [address]  Culture, blood  (routine x 2)     Status: None (Preliminary result)   Collection Time: 11/23/22 11:50 AM   Specimen: BLOOD  Result Value Ref Range Status   Specimen Description BLOOD SITE NOT SPECIFIED  Final   Special Requests   Final    BOTTLES DRAWN AEROBIC AND ANAEROBIC Blood Culture adequate volume   Culture   Final    NO GROWTH 2 DAYS Performed at [facility], [address], [place], [place] [address]    Report Status PENDING  Incomplete  Culture, blood (routine x 2)     Status: None (Preliminary result)   Collection Time: 11/23/22 11:57 AM   Specimen: BLOOD RIGHT HAND  Result Value Ref Range Status   Specimen Description BLOOD RIGHT HAND  Final   Special Requests   Final    BOTTLES DRAWN AEROBIC ONLY Blood Culture results may not be optimal due to an inadequate volume of blood received in culture bottles   Culture   Final    NO GROWTH 2 DAYS Performed at [facility], [place] [address], [place],  [address]    Report Status PENDING  Incomplete  Urine Culture     Status: Abnormal   Collection Time: 11/23/22  1:15 PM   Specimen: Urine, Clean Catch  Result Value Ref Range Status   Specimen Description URINE, CLEAN CATCH  Final   Special Requests   Final    NONE Performed at [facility], [address], [place], [place] [address]    Culture 30,000 COLONIES/mL ESCHERICHIA COLI (A)  Final   Report Status 11/25/2022 FINAL  Final   Organism ID, Bacteria ESCHERICHIA COLI (A)  Final      Susceptibility   Escherichia coli - MIC*    AMPICILLIN <=2 SENSITIVE Sensitive     CEFAZOLIN <=4 SENSITIVE Sensitive     CEFEPIME <=0.12 SENSITIVE Sensitive     CEFTRIAXONE <=0.25 SENSITIVE Sensitive     CIPROFLOXACIN <=0.25 SENSITIVE Sensitive     GENTAMICIN <=1 SENSITIVE Sensitive     IMIPENEM <=0.25 SENSITIVE Sensitive     NITROFURANTOIN <=16 SENSITIVE Sensitive     TRIMETH/SULFA <=20 SENSITIVE Sensitive     AMPICILLIN/SULBACTAM <=2 SENSITIVE Sensitive     PIP/TAZO <=4  SENSITIVE Sensitive     * 30,000 COLONIES/mL ESCHERICHIA COLI     Time coordinating discharge: Over 30 minutes  SIGNED:   Darliss Cheney, MD  [facility] 11/25/2022, 3:34 PM *Please note that this is a verbal dictation therefore any spelling or grammatical errors are due to the "[place] [employer]" system interpretation. If 7PM-7AM, please contact night-coverage [email or web address]

## 2022-11-25 NOTE — Care Management (Signed)
  Transition of Care (TOC) Screening Note   Patient Details  Name: Anne Roach Date of Birth: 1933-07-14   Transition of Care [facility]) CM/SW Contact:    Carles Collet, RN Phone Number: 11/25/2022, 8:13 AM    Transition of Care Department [facility]) has reviewed patient and will continue to monitor patient advancement through interdisciplinary progression rounds. If new patient transition needs arise, please place a TOC consult.  From home, PT OT pending, active or recently active w Alvis Lemmings

## 2022-11-25 NOTE — Evaluation (Signed)
Occupational Therapy Evaluation Patient Details Name: Anne MIMBS MRN: [id number] DOB: 1933/02/07 Today's Date: 11/25/2022   History of Present Illness 89 yo female presenting with cough and associated symptoms over a week gradually getting worse with associated nausea, vomiting/diarrhea and shortness of breath. PMH DM, A fib, OSA, CKD 3, HTN, GERD, macular degeneration, chronic pain, CHF.   Clinical Impression   Patient admitted for the diagnosis above.  PTA she lives with her daughter, who works in [place] during the week, and is unable to assist if needed.  The patient does have a HHA that comes for about 2 hours a day during the week, and every other Saturday.  The daughter is looking to increase HHA to 4 hours each day through Medicaid.  The patient has admitted to 4 recent falls without injury, and despite having a life alert, does not use it.  Primary deficits are knee pain, lower extremity weakness and poor dynamic balance.  The daughter and HHA are assisting as needed with ADL completion, so she is probably close to her baseline for self care, but is currently needing Min Guard for mobility.  PT eval is pending, but no post acute OT is anticipated, but could be considered if [facility] PT sees a need..        Recommendations for follow up therapy are one component of a multi-disciplinary discharge planning process, led by the attending physician.  Recommendations may be updated based on patient status, additional functional criteria and insurance authorization.   Follow Up Recommendations  No OT follow up     Assistance Recommended at Discharge Intermittent Supervision/Assistance  Patient can return home with the following Assist for transportation;Assistance with cooking/housework;A little help with bathing/dressing/bathroom;A little help with walking and/or transfers    Functional Status Assessment  Patient has had a recent decline in their functional status and demonstrates the ability  to make significant improvements in function in a reasonable and predictable amount of time.  Equipment Recommendations  None recommended by OT    Recommendations for Other Services       Precautions / Restrictions Precautions Precautions: Fall Precaution Comments: chronic knee pain.  admits to 4 recent falls. Restrictions Weight Bearing Restrictions: No      Mobility Bed Mobility Overal bed mobility: Modified Independent                  Transfers Overall transfer level: Needs assistance   Transfers: Sit to/from Stand, Bed to chair/wheelchair/BSC Sit to Stand: Min guard     Step pivot transfers: Min guard     General transfer comment: initially leaning backwards against the bed with stand.      Balance Overall balance assessment: Needs assistance Sitting-balance support: Feet supported Sitting balance-Leahy Scale: Good     Standing balance support: Reliant on assistive device for balance Standing balance-Leahy Scale: Poor                             ADL either performed or assessed with clinical judgement   ADL       Grooming: Wash/dry hands;Supervision/safety;Standing           Upper Body Dressing : Set up;Sitting   Lower Body Dressing: Sit to/from stand;Min guard Lower Body Dressing Details (indicate cue type and reason): able to place sock seated, Min Guard standing Toilet Transfer: Min guard;Rolling walker (2 wheels);Ambulation  Vision Patient Visual Report: No change from baseline       Perception     Praxis      Pertinent Vitals/Pain Pain Assessment Pain Assessment: Faces Faces Pain Scale: Hurts even more Pain Location: Knees Pain Descriptors / Indicators: Tender, Sore, Aching Pain Intervention(s): Monitored during session     Hand Dominance Right   Extremity/Trunk Assessment Upper Extremity Assessment Upper Extremity Assessment: Overall WFL for tasks assessed   Lower Extremity  Assessment Lower Extremity Assessment: Defer to PT evaluation   Cervical / Trunk Assessment Cervical / Trunk Assessment: Kyphotic   Communication Communication Communication: No difficulties   Cognition Arousal/Alertness: Awake/alert Behavior During Therapy: WFL for tasks assessed/performed Overall Cognitive Status: Within Functional Limits for tasks assessed                                       General Comments   VSS on RA    Exercises     Shoulder Instructions      Home Living Family/patient expects to be discharged to:: Private residence Living Arrangements: Children Available Help at Discharge: Family;Available PRN/intermittently Type of Home: House Home Access: Level entry;Ramped entrance     Home Layout: Multi-level;Able to live on main level with bedroom/bathroom Alternate Level Stairs-Number of Steps: 13   Bathroom Shower/Tub: Occupational psychologist: Handicapped height Bathroom Accessibility: Yes How Accessible: Accessible via walker Home Equipment: [place] (2 wheels);Grab bars - tub/shower;Shower seat - built in;BSC/3in1;Rollator (4 wheels);Hand held shower head;Other (comment)   Additional Comments: Life Alert, HHA M, Tu, Th, Fr, and everyother Sat for about 2 hours/day.      Prior Functioning/Environment Prior Level of Function : Needs assist             Mobility Comments: Walks household distances with RW ADLs Comments: Daughter has begun providing more assist with ADL, and patient does occasionally participate with light IADL at times.  Daughter assists with med and community mobility.        OT Problem List: Decreased activity tolerance;Impaired balance (sitting and/or standing);Pain      OT Treatment/Interventions: Self-care/ADL training;Therapeutic activities;Balance training;Patient/family education;DME and/or AE instruction    OT Goals(Current goals can be found in the care plan section) Acute Rehab OT  Goals Patient Stated Goal: Possibly returning today OT Goal Formulation: With patient Time For Goal Achievement: 12/09/22 Potential to Achieve Goals: Good  OT Frequency: Min 2X/week    Co-evaluation              AM-PAC OT "6 Clicks" Daily Activity     Outcome Measure Help from another person eating meals?: None Help from another person taking care of personal grooming?: A Little Help from another person toileting, which includes using toliet, bedpan, or urinal?: A Little Help from another person bathing (including washing, rinsing, drying)?: A Little Help from another person to put on and taking off regular upper body clothing?: None Help from another person to put on and taking off regular lower body clothing?: A Little 6 Click Score: 20   End of Session Equipment Utilized During Treatment: Rolling walker (2 wheels) Nurse Communication: Mobility status  Activity Tolerance: Patient limited by pain Patient left: in bed;with call bell/phone within reach;with family/visitor present  OT Visit Diagnosis: Unsteadiness on feet (R26.81);Pain Pain - part of body: Leg;Knee  Time: 1205-1227 OT Time Calculation (min): 22 min Charges:  OT General Charges $OT Visit: 1 Visit OT Evaluation $OT Eval Moderate Complexity: 1 Mod  11/25/2022  RP, OTR/L  Acute Rehabilitation Services  Office:  [place] 11/25/2022, 12:48 PM

## 2022-11-26 ENCOUNTER — Emergency Department (HOSPITAL_COMMUNITY): Payer: Medicare HMO

## 2022-11-26 ENCOUNTER — Encounter (HOSPITAL_COMMUNITY): Payer: Self-pay

## 2022-11-26 ENCOUNTER — Ambulatory Visit
Admission: RE | Admit: 2022-11-26 | Discharge: 2022-11-26 | Disposition: A | Discharge status: Home / Self Care | Payer: Medicare HMO | Source: Ambulatory Visit | Attending: Physician Assistant | Admitting: Physician Assistant

## 2022-11-26 ENCOUNTER — Emergency Department (HOSPITAL_COMMUNITY)
Admission: EM | Admit: 2022-11-26 | Discharge: 2022-11-27 | Disposition: A | Discharge status: Home / Self Care | Payer: Medicare HMO | Attending: Emergency Medicine | Admitting: Emergency Medicine

## 2022-11-26 DIAGNOSIS — R1110 Vomiting, unspecified: Secondary | ICD-10-CM | POA: Diagnosis not present

## 2022-11-26 DIAGNOSIS — I4821 Permanent atrial fibrillation: Secondary | ICD-10-CM | POA: Diagnosis not present

## 2022-11-26 DIAGNOSIS — R1013 Epigastric pain: Secondary | ICD-10-CM | POA: Diagnosis present

## 2022-11-26 DIAGNOSIS — Z7901 Long term (current) use of anticoagulants: Secondary | ICD-10-CM | POA: Insufficient documentation

## 2022-11-26 DIAGNOSIS — K862 Cyst of pancreas: Secondary | ICD-10-CM

## 2022-11-26 DIAGNOSIS — I5033 Acute on chronic diastolic (congestive) heart failure: Secondary | ICD-10-CM | POA: Diagnosis not present

## 2022-11-26 DIAGNOSIS — Z79899 Other long term (current) drug therapy: Secondary | ICD-10-CM | POA: Insufficient documentation

## 2022-11-26 DIAGNOSIS — I130 Hypertensive heart and chronic kidney disease with heart failure and stage 1 through stage 4 chronic kidney disease, or unspecified chronic kidney disease: Secondary | ICD-10-CM | POA: Insufficient documentation

## 2022-11-26 DIAGNOSIS — E1122 Type 2 diabetes mellitus with diabetic chronic kidney disease: Secondary | ICD-10-CM | POA: Insufficient documentation

## 2022-11-26 DIAGNOSIS — N183 Chronic kidney disease, stage 3 unspecified: Secondary | ICD-10-CM | POA: Insufficient documentation

## 2022-11-26 LAB — CBC WITH DIFFERENTIAL/PLATELET

## 2022-11-26 MED ORDER — IOPAMIDOL (ISOVUE-300) INJECTION 61%
100.0000 mL | Freq: Once | INTRAVENOUS | Status: AC | PRN
  Administered 2022-11-26: 100 mL via INTRAVENOUS

## 2022-11-26 NOTE — ED Triage Notes (Signed)
Pt arrived via EMS, from home, c/o aba pain and nausea. Had CT done this after noon to look at pancreas. [place] recommended for further evaluation.

## 2022-11-26 NOTE — ED Provider Triage Note (Signed)
Emergency Medicine Provider Triage Evaluation Note  Anne Roach , a 89 y.o. female  was evaluated in triage.  Pt complains of epigastric and right upper quadrant abdominal pain.  Patient had a CT done this afternoon which showed dilated gallbladder with adjacent fluid and stranding, ultrasound recommended.  History is difficult due to language barrier.  Review of Systems  Positive: As above Negative: As above  Physical Exam  BP 121/75 (BP Location: Left Arm)   Pulse 90   Temp 98.1 F (36.7 C) (Oral)   Resp 20   SpO2 100%  Gen:   Awake, no distress   Resp:  Normal effort  MSK:   Moves extremities without difficulty  Other:  TTP to epigastrium and right upper quadrant.  Medical Decision Making  Medically screening exam initiated at 5:09 PM.  Appropriate orders placed.  DHIYA SMITS was informed that the remainder of the evaluation will be completed by another provider, this initial triage assessment does not replace that evaluation, and the importance of remaining in the ED until their evaluation is complete.     Rex Kras, PA 11/27/22 0130

## 2022-11-27 DIAGNOSIS — R1013 Epigastric pain: Secondary | ICD-10-CM | POA: Diagnosis not present

## 2022-11-27 LAB — COMPREHENSIVE METABOLIC PANEL
ALT: 9 U/L (ref 0–44)
AST: 17 U/L (ref 15–41)
Albumin: 3 g/dL — ABNORMAL LOW (ref 3.5–5.0)
Alkaline Phosphatase: 38 U/L (ref 38–126)
Anion gap: 9 (ref 5–15)
BUN: 12 mg/dL (ref 8–23)
CO2: 20 mmol/L — ABNORMAL LOW (ref 22–32)
Calcium: 8.5 mg/dL — ABNORMAL LOW (ref 8.9–10.3)
Chloride: 108 mmol/L (ref 98–111)
Creatinine, Ser: 1.15 mg/dL — ABNORMAL HIGH (ref 0.44–1.00)
GFR, Estimated: 46 mL/min — ABNORMAL LOW (ref >60)
Glucose, Bld: 117 mg/dL — ABNORMAL HIGH (ref 70–99)
Potassium: 4.1 mmol/L (ref 3.5–5.1)
Sodium: 137 mmol/L (ref 135–145)
Total Bilirubin: 0.5 mg/dL (ref 0.3–1.2)
Total Protein: 7.1 g/dL (ref 6.5–8.1)

## 2022-11-27 LAB — CBC WITH DIFFERENTIAL/PLATELET
Abs Immature Granulocytes: 0.06 10*3/uL (ref 0.00–0.07)
Basophils Absolute: 0.1 10*3/uL (ref 0.0–0.1)
Basophils Relative: 1 %
Eosinophils Absolute: 0.3 10*3/uL (ref 0.0–0.5)
Eosinophils Relative: 4 %
HCT: 29.6 % — ABNORMAL LOW (ref 36.0–46.0)
Hemoglobin: 8.3 g/dL — ABNORMAL LOW (ref 12.0–15.0)
Immature Granulocytes: 1 %
Lymphocytes Relative: 32 %
Lymphs Abs: 2.6 10*3/uL (ref 0.7–4.0)
MCH: 23.2 pg — ABNORMAL LOW (ref 26.0–34.0)
MCHC: 28 g/dL — ABNORMAL LOW (ref 30.0–36.0)
MCV: 82.7 fL (ref 80.0–100.0)
Monocytes Absolute: 1 10*3/uL (ref 0.1–1.0)
Monocytes Relative: 12 %
Neutro Abs: 4.2 10*3/uL (ref 1.7–7.7)
Neutrophils Relative %: 50 %
Platelets: 220 10*3/uL (ref 150–400)
RBC: 3.58 MIL/uL — ABNORMAL LOW (ref 3.87–5.11)
RDW: 21.5 % — ABNORMAL HIGH (ref 11.5–15.5)
WBC: 8.3 10*3/uL (ref 4.0–10.5)
nRBC: 0.2 % (ref 0.0–0.2)

## 2022-11-27 LAB — LIPASE, BLOOD: Lipase: 38 U/L (ref 11–51)

## 2022-11-27 MED ORDER — LIDOCAINE VISCOUS HCL 2 % MT SOLN
15.0000 mL | Freq: Once | OROMUCOSAL | Status: AC
  Administered 2022-11-27: 15 mL via ORAL
  Filled 2022-11-27: qty 15

## 2022-11-27 MED ORDER — LIDOCAINE VISCOUS HCL 2 % MT SOLN: 10.0000 mL | Freq: Four times a day (QID) | OROMUCOSAL | 0 refills | Status: DC | PRN

## 2022-11-27 MED ORDER — ALUM & MAG HYDROXIDE-SIMETH 200-200-20 MG/5ML PO SUSP
30.0000 mL | Freq: Once | ORAL | Status: AC
  Administered 2022-11-27: 30 mL via ORAL
  Filled 2022-11-27: qty 30

## 2022-11-27 MED ORDER — ONDANSETRON 4 MG PO TBDP: 4.0000 mg | ORAL_TABLET | Freq: Three times a day (TID) | ORAL | 0 refills | Status: AC | PRN

## 2022-11-27 NOTE — ED Provider Notes (Signed)
[facility] DEPT Provider Note  CSN: [id number] Arrival date & time: 11/26/22 1646  Chief Complaint(s) Abdominal Pain  HPI Anne Roach is a 89 y.o. female    The history is provided by the patient and a relative.  Abdominal Pain Pain location:  Epigastric Pain quality: aching   Pain radiates to:  Does not radiate Pain severity now: moderate to severe. Onset quality:  Gradual Duration:  9 hours Timing:  Constant Progression:  Waxing and waning Chronicity:  New Context comment:  Had CT scan earlier today to assess pancreatic cyst. pain began a couple of hours afterwards. Relieved by:  Nothing Worsened by:  Palpation Associated symptoms: vomiting   Associated symptoms: no chest pain, no chills, no cough, no nausea and no shortness of breath     Past Medical History Past Medical History:  Diagnosis Date   Acute on chronic diastolic CHF (congestive heart failure) ([place]) 03/27/2022   Atrial fibrillation (HCC)    Atrial fibrillation with RVR (HCC) 03/20/2017   Chronic diastolic CHF (congestive heart failure) ([place]) 10/31/2020   DM (diabetes mellitus) ([place])    HTN (hypertension)    PNA (pneumonia) 10/31/2020   Sepsis secondary to UTI ([place]) 07/11/2020   Tremor    Patient Active Problem List   Diagnosis Date Noted   AKI (acute kidney injury) ([place]) 11/25/2022   Acute renal failure superimposed on stage 3b chronic kidney disease ([place]) 11/23/2022   Lactic acidosis 11/23/2022   Aortic atherosclerosis (HCC) 11/20/2022   Pleural effusion    Dysphagia    Chronic pain 03/27/2022   Permanent atrial fibrillation ([place]) 01/29/2021   Secondary hypercoagulable state ([place]) 01/29/2021   SIRS (systemic inflammatory response syndrome) ([place]) 10/31/2020   Abdominal pain 10/31/2020   GERD (gastroesophageal reflux disease) 10/31/2020   Chronic diastolic CHF (congestive heart failure) ([place]) 10/31/2020   Chronic atrial fibrillation ([place]) 07/11/2020    Campylobacter gastrointestinal tract infection 01/03/2019   Chronic kidney disease (CKD), stage III (moderate) 01/02/2019   Hypoalbuminemia 01/02/2019   Bandemia without diagnosis of specific infection 01/02/2019   Hypotension 01/02/2019   Bacteria in urine 01/01/2019   Myogenic ptosis of bilateral eyelids 06/24/2016   HTN (hypertension)    Tremor    Exudative age-related macular 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 Authorizing Provider  lidocaine (XYLOCAINE) 2 % solution Use as directed 10 mLs in the mouth or throat every 6 (six) hours as needed (stomach pain). 11/27/22  Yes Aki Abalos, Grayce Sessions, MD  ondansetron (ZOFRAN-ODT) 4 MG disintegrating tablet Take 1 tablet (4 mg total) by mouth every 8 (eight) hours as needed for up to 3 days for nausea or vomiting. 11/27/22 11/30/22 Yes Jara Feider, Grayce Sessions, MD  ACCU-CHEK AVIVA PLUS test strip  10/06/20   [provider]  acetaminophen (TYLENOL) 500 MG tablet Take 1,000 mg by mouth every 6 (six) hours as needed for mild pain or headache.    [provider]  allopurinol (ZYLOPRIM) 100 MG tablet TAKE 1 TABLET(100 MG) BY MOUTH DAILY Patient taking differently: Take 100 mg by mouth daily. 08/09/21   Edrick Kins, DPM  apixaban (ELIQUIS) 5 MG TABS tablet TAKE  1 TABLET(5 MG) BY MOUTH TWICE DAILY Patient taking differently: Take 5 mg by mouth 2 (two) times daily. 07/02/22   Josue Hector, MD  Cholecalciferol (VITAMIN D) 125 MCG (5000 UT) CAPS Take 5,000 Units by mouth daily with breakfast.    [provider]  donepezil (ARICEPT) 5 MG tablet Take 5 mg by mouth at bedtime.     [provider]  empagliflozin (JARDIANCE) 10 MG TABS tablet Take 1 tablet (10 mg total) by mouth daily. 05/01/22   Josue Hector, MD  FARXIGA 10 MG TABS tablet Take 10 mg by mouth daily. 09/25/22   [provider]  folic acid (FOLVITE) 706 MCG tablet Take 800 mcg by mouth daily.    [provider]  furosemide (LASIX) 40 MG tablet TAKE 1 TABLET(40 MG) BY MOUTH DAILY Patient taking differently: Take 40 mg by mouth daily. 05/09/22   Josue Hector, MD  gabapentin (NEURONTIN) 400 MG capsule Take 400 mg by mouth 3 (three) times daily. 03/25/22   [provider]  metoprolol succinate (TOPROL-XL) 50 MG 24 hr tablet Take 1 tablet (50 mg total) by mouth daily. Take with or immediately following a meal. 05/01/22   Josue Hector, MD  Multiple Vitamins-Minerals (ICAPS AREDS 2 PO) Take 1 capsule by mouth 2 (two) times daily.    [provider]  Potassium Chloride ER 20 MEQ TBCR Take 20 mEq by mouth daily. 04/09/22   Clegg, Amy D, NP  traMADol (ULTRAM) 50 MG tablet Take 1 tablet (50 mg total) by mouth every 12 (twelve) hours as needed. Patient taking differently: Take 50 mg by mouth every 12 (twelve) hours as needed for moderate pain. 04/02/22   Domenic Polite, MD                                                                                                                                    Allergies Patient has no known allergies.  Review of Systems Review of Systems  Constitutional:  Negative for chills.  Respiratory:  Negative for cough and shortness of breath.   Cardiovascular:  Negative for chest pain.  Gastrointestinal:  Positive for abdominal pain and vomiting. Negative for nausea.   As noted in HPI  Physical Exam Vital Signs  I have reviewed the triage vital signs BP 121/82   Pulse 99   Temp 99 F (37.2 C) (Oral)   Resp 20   SpO2 100%   Physical Exam Vitals reviewed.  Constitutional:      General: She is not in acute distress.     Appearance: She is well-developed. She is obese. She is not diaphoretic.  HENT:     Head: Normocephalic and atraumatic.     Right Ear: External ear normal.     Left Ear: External ear normal.     Nose: Nose normal.  Eyes:     General: No scleral icterus.  Conjunctiva/sclera: Conjunctivae normal.  Neck:     Trachea: Phonation normal.  Cardiovascular:     Rate and Rhythm: Normal rate and regular rhythm.  Pulmonary:     Effort: Pulmonary effort is normal. No respiratory distress.     Breath sounds: No stridor.  Abdominal:     General: There is no distension.     Tenderness: There is abdominal tenderness in the epigastric area. There is no guarding or rebound.  Musculoskeletal:        General: Normal range of motion.     Cervical back: Normal range of motion.  Neurological:     Mental Status: She is alert and oriented to person, place, and time.  Psychiatric:        Behavior: Behavior normal.     ED Results and Treatments Labs (all labs ordered are listed, but only abnormal results are displayed) Labs Reviewed  CBC WITH DIFFERENTIAL/PLATELET - Abnormal; Notable for the following components:      Result Value   RBC 3.58 (*)    Hemoglobin 8.3 (*)    HCT 29.6 (*)    MCH 23.2 (*)    MCHC 28.0 (*)    RDW 21.5 (*)    All other components within normal limits  COMPREHENSIVE METABOLIC PANEL - Abnormal; Notable for the following components:   CO2 20 (*)    Glucose, Bld 117 (*)    Creatinine, Ser 1.15 (*)    Calcium 8.5 (*)    Albumin 3.0 (*)    GFR, Estimated 46 (*)    All other components within normal limits  LIPASE, BLOOD                                                                                                                         EKG  EKG Interpretation  Date/Time:    Ventricular Rate:    PR Interval:    QRS Duration:   QT Interval:    QTC Calculation:   R Axis:     Text Interpretation:         Radiology US Abdomen Limited RUQ (LIVER/GB)  Result  Date: 11/26/2022 CLINICAL DATA:  Right upper quadrant and epigastric pain EXAM: ULTRASOUND ABDOMEN LIMITED RIGHT UPPER QUADRANT COMPARISON:  CT 11/26/2022 FINDINGS: Gallbladder: No gallstones or wall thickening visualized. No sonographic Murphy sign noted by sonographer. Common bile duct: Diameter: 3 mm Liver: Echogenic liver parenchyma. No focal hepatic abnormality. Portal vein is patent on color Doppler imaging with normal direction of blood flow towards the liver. Other: None. IMPRESSION: 1. Slight gallbladder distension but no evidence for stones, wall thickening or sonographic Murphy. 2. Echogenic liver parenchyma suggesting hepatic steatosis and or hepatocellular disease. Electronically Signed   By: Donavan Foil M.D.   On: 11/26/2022 19:01   CT ABDOMEN W WO CONTRAST  Result Date: 11/26/2022 CLINICAL DATA:  Pancreatic cyst. EXAM: CT ABDOMEN WITHOUT AND WITH CONTRAST TECHNIQUE: Multidetector CT imaging of the abdomen was performed following the standard protocol  before and following the bolus administration of intravenous contrast. Dynamic examination was performed. RADIATION DOSE REDUCTION: This exam was performed according to the departmental dose-optimization program which includes automated exposure control, adjustment of the mA and/or kV according to patient size and/or use of iterative reconstruction technique. CONTRAST:  17m ISOVUE-300 IOPAMIDOL (ISOVUE-300) INJECTION 61% COMPARISON:  CT 01/18/2021 and older FINDINGS: Lower chest: The heart is enlarged with a small to moderate pericardial effusion, slightly increased from previous. There is also a small right pleural effusion and trace left with adjacent opacities which is similar to prior. Hepatobiliary: Diffuse fatty liver infiltration identified. Patent portal vein. The gallbladder is present and is distended. The gallbladder is incompletely included in the imaging field as is the liver. There also may be some gallbladder wall thickening and  stranding. Please correlate for any clinical evidence of acute cholecystitis and further workup is recommended. Pancreas: There is some atrophy of the pancreas. Once again towards the uncinate process a complex cystic lesion with some thick septa and calcification. On series 18, image 27 this measures 2.4 by 1.8 cm. On the most recent prior from March 2022 this measured 2.4 cm. Of note this was seen in retrospect on the study of 2006 as a much smaller lesion demonstrating slow interval growth. FBurtis Junesa more benign process. Spleen: Spleen is nonenlarged. Adrenals/Urinary Tract: The adrenal glands are unremarkable. Mild bilateral renal atrophy identified. Prominent renal sinus fat without collecting system dilatation. Bosniak 1 bilateral renal cysts are again identified, similar to previous. Stomach/Bowel: The visualized bowel is nondilated. The stomach, small bowel and large bowel. Vascular/Lymphatic: Normal caliber aorta and IVC. Mild vascular calcifications along the aorta and branch vessels. There is an enlarged lymph node today just medial to the right of the pancreatic head on series 18, image 20 measuring 17 by 14 mm. This lymph node was seen on the study of 01/18/2021 at that time measured 13 by 11 mm. Other: No abdominal wall hernia or abnormality. Musculoskeletal: Curvature and degenerative changes along the spine. IMPRESSION: Complex cystic lesion involving the pancreatic head anteriorly is similar to the most recent examination from March 2022. Solitary enlarged lymph node in the porta hepatis but similar to the study of March 2022. Fatty liver infiltration. Dilated gallbladder with adjacent fluid and stranding. Please correlate for any clinical evidence of acute cholecystitis and recommend further evaluation such as ultrasound. Persistent pleural effusions and adjacent lung opacities. Enlarged heart with a pericardial effusion. Case discussed with the ordering service on the afternoon of 11/26/2022 by  myself, TDeliah GoodyPA Electronically Signed   By: AJill SideM.D.   On: 11/26/2022 16:10    Medications Ordered in ED Medications  alum & mag hydroxide-simeth (MAALOX/MYLANTA) 200-200-20 MG/5ML suspension 30 mL (30 mLs Oral Given 11/27/22 0113)    And  lidocaine (XYLOCAINE) 2 % viscous mouth solution 15 mL (15 mLs Oral Given 11/27/22 0113)  Procedures Procedures  (including critical care time)  Medical Decision Making / ED Course   Medical Decision Making Amount and/or Complexity of Data Reviewed Labs: ordered. Decision-making details documented in ED Course. Radiology: ordered and independent interpretation performed. Decision-making details documented in ED Course. ECG/medicine tests: ordered and independent interpretation performed. Decision-making details documented in ED Course.  Risk OTC drugs. Prescription drug management.    Patient presents with epigastric abdominal pain Known to have pancreatic cysts Differential includes but not limited to gastritis/esophagitis, pancreatitis, biliary obstruction/biliary disease.  I personally reviewed the CT scan from earlier in the day which did not show any serious intra-abdominal inflammatory/infectious process.  Will get labs and ultrasound  CBC without leukocytosis.  Stable hemoglobin. Metabolic panel without significant electrolyte derangement.  Mild renal sufficiency stable from prior.  No evidence of bili obstruction or pancreatitis. Right upper quadrant ultrasound notable for slightly distended gallbladder without evidence of acute cholecystitis or biliary obstruction.  Patient was provided with a GI cocktail which provided complete resolution of her symptoms.  Given the reproductive nature of her discomfort, and relieve with GI cocktail I have low suspicion this is a cardiac etiology.       Final Clinical Impression(s) / ED Diagnoses Final diagnoses:  Epigastric pain   The patient appears reasonably screened and/or stabilized for discharge and I doubt any other medical condition or other [facility] requiring further screening, evaluation, or treatment in the ED at this time. I have discussed the findings, Dx and Tx plan with the patient/family who expressed understanding and agree(s) with the plan. Discharge instructions discussed at length. The patient/family was given strict return precautions who verbalized understanding of the instructions. No further questions at time of discharge.  Disposition: Discharge  Condition: Good  ED Discharge Orders          Ordered    ondansetron (ZOFRAN-ODT) 4 MG disintegrating tablet  Every 8 hours PRN        11/27/22 0134    lidocaine (XYLOCAINE) 2 % solution  Every 6 hours PRN        11/27/22 0134             Follow Up: Lin Landsman, [place] [address] [phone]  Call  to schedule an appointment for close follow up           This chart was dictated using voice recognition software.  Despite best efforts to proofread,  errors can occur which can change the documentation meaning.    Fatima Blank, MD 11/27/22 [id number]

## 2022-11-28 LAB — CULTURE, BLOOD (ROUTINE X 2)
Culture: NO GROWTH
Culture: NO GROWTH
Special Requests: ADEQUATE

## 2022-12-16 ENCOUNTER — Encounter (HOSPITAL_COMMUNITY): Payer: Self-pay | Admitting: Gastroenterology

## 2022-12-16 NOTE — Progress Notes (Signed)
Attempted to obtain medical history via telephone, unable to reach at this time. HIPAA compliant voicemail message left on daughter's phone requesting return call to pre surgical testing department.

## 2022-12-22 NOTE — H&P (Signed)
History of Present Illness General:         89 year old female(originally from [place]) seen by Deliah Goody, PA on 10/22/2022 with dysphagia        Modified barium swallow 05/28/2022 showed no aspiration, esophageal dysmotility, advised to take acid reflux medications.        She was given Gaviscon to be taken 3 times a day and to continue pantoprazole and to get a barium swallow with pill and CAT scan for pancreatic cysts.        Barium swallow from 10/31/2022 showed extensive tertiary contractions and middle and distal esophagus suggesting presbyesophagus, persistent narrowing of GE junction suggesting stricture, 13 mm barium tablet did not pass in the stomach.        CT abdomen and pelvis from 11/26/2022 with and without contrast showed diffuse fatty liver, patent portal vein atrophia of pancreas, complex cystic lesion in uncinate process with thick septation and calcification measuring 2.4 x 1.8 cm favor benign process as the lesion demonstrates slow interval growth since March 2022, solitary enlarged lymph node in porta hepatis but similar since March 2022, dilated gallbladder with fluid and stranding, persistent pleural effusion, enlarged heart with pericardial effusion        She is present with her daughter who tells me that she has trouble swallowing food as they get stuck in her epigastrium and she has been eating soft food for the past 2-3 months.        SHe was been on pantoprazole without much improvement.        She is on Eliquis prescribed by cardiologist and he has approved to hold it for 1 day prior to procedure if needed.        Denies blood in stool or black stools, denies abdominal pain, denies unintentional weight loss.        She was hospitalized in 11/2022 for chest pain at [facility], her Hb was low at around 7, she was hospitalized for 3 days, did not need any blood tranfusion. Vital Signs Wt: 169.0, Wt change: -3.6 lbs, Ht: 65, BMI: 28.12, Temp: 97.9, Pulse sitting: 76, BP sitting:  97/62. Examination Gastroenterology::        GENERAL APPEARANCE: Well developed, well nourished, no active distress, pleasant.         SCLERA: anicteric.         CARDIOVASCULAR Normal RRR .         RESPIRATORY Breath sounds normal. Respiration even and unlabored.         ABDOMEN No masses palpated. Liver and spleen not palpated, normal. Bowel sounds normal, Abdomen not distended.         EXTREMITIES: No edema.         NEURO: alert, oriented to time, place and person, currently on a wheelchair(cannot walk a long distance).         PSYCH: mood/affect normal.  Assessments 1. Abnormal barium swallow - R93.3 (Primary) 2. On apixaban therapy - Z79.01 Treatment 1. Abnormal barium swallow       IMAGING: EGD w/ DILATATION (Ordered for 12/17/2022) Notes: Patient is currently scheduled for EGD with possible balloon dilation o Tuesday at [facility]. The risks and the benefits of the procedure were discussed with the patient in details. She understands and verbalizes consent. SHe is aware to hold eliquis for 1 day prior to the procedure.    2. On apixaban therapy  Notes: Patient is currently scheduled for EGD with possible balloon dilation o Tuesday at [facility]. The  risks and the benefits of the procedure were discussed with the patient in details. She understands and verbalizes consent. SHe is aware to hold eliquis for 1 day prior to the procedure.

## 2022-12-23 ENCOUNTER — Ambulatory Visit (HOSPITAL_COMMUNITY): Payer: Medicare HMO | Admitting: Anesthesiology

## 2022-12-23 ENCOUNTER — Encounter (HOSPITAL_COMMUNITY): Payer: Self-pay | Admitting: Gastroenterology

## 2022-12-23 ENCOUNTER — Other Ambulatory Visit: Payer: Self-pay

## 2022-12-23 ENCOUNTER — Ambulatory Visit (HOSPITAL_BASED_OUTPATIENT_CLINIC_OR_DEPARTMENT_OTHER): Payer: Medicare HMO | Admitting: Anesthesiology

## 2022-12-23 ENCOUNTER — Encounter (HOSPITAL_COMMUNITY)
Admission: RE | Disposition: A | Discharge status: Home / Self Care | Payer: Self-pay | Source: Home / Self Care | Attending: Gastroenterology

## 2022-12-23 ENCOUNTER — Ambulatory Visit (HOSPITAL_COMMUNITY)
Admission: RE | Admit: 2022-12-23 | Discharge: 2022-12-23 | Disposition: A | Discharge status: Home / Self Care | Payer: Medicare HMO | Attending: Gastroenterology | Admitting: Gastroenterology

## 2022-12-23 DIAGNOSIS — K760 Fatty (change of) liver, not elsewhere classified: Secondary | ICD-10-CM | POA: Insufficient documentation

## 2022-12-23 DIAGNOSIS — K224 Dyskinesia of esophagus: Secondary | ICD-10-CM | POA: Insufficient documentation

## 2022-12-23 DIAGNOSIS — I3139 Other pericardial effusion (noninflammatory): Secondary | ICD-10-CM | POA: Diagnosis not present

## 2022-12-23 DIAGNOSIS — K3189 Other diseases of stomach and duodenum: Secondary | ICD-10-CM | POA: Diagnosis not present

## 2022-12-23 DIAGNOSIS — I110 Hypertensive heart disease with heart failure: Secondary | ICD-10-CM | POA: Diagnosis not present

## 2022-12-23 DIAGNOSIS — J90 Pleural effusion, not elsewhere classified: Secondary | ICD-10-CM | POA: Diagnosis not present

## 2022-12-23 DIAGNOSIS — R1310 Dysphagia, unspecified: Secondary | ICD-10-CM | POA: Insufficient documentation

## 2022-12-23 DIAGNOSIS — I517 Cardiomegaly: Secondary | ICD-10-CM | POA: Diagnosis not present

## 2022-12-23 DIAGNOSIS — E119 Type 2 diabetes mellitus without complications: Secondary | ICD-10-CM

## 2022-12-23 DIAGNOSIS — Z79899 Other long term (current) drug therapy: Secondary | ICD-10-CM | POA: Diagnosis not present

## 2022-12-23 DIAGNOSIS — Z7984 Long term (current) use of oral hypoglycemic drugs: Secondary | ICD-10-CM

## 2022-12-23 DIAGNOSIS — R933 Abnormal findings on diagnostic imaging of other parts of digestive tract: Secondary | ICD-10-CM | POA: Insufficient documentation

## 2022-12-23 DIAGNOSIS — I509 Heart failure, unspecified: Secondary | ICD-10-CM

## 2022-12-23 DIAGNOSIS — Z7901 Long term (current) use of anticoagulants: Secondary | ICD-10-CM | POA: Diagnosis not present

## 2022-12-23 HISTORY — PX: ESOPHAGEAL DILATION: SHX303

## 2022-12-23 HISTORY — PX: BIOPSY: SHX5522

## 2022-12-23 HISTORY — PX: ESOPHAGOGASTRODUODENOSCOPY (EGD) WITH PROPOFOL: SHX5813

## 2022-12-23 LAB — GLUCOSE, CAPILLARY: Glucose-Capillary: 131 mg/dL — ABNORMAL HIGH (ref 70–99)

## 2022-12-23 SURGERY — ESOPHAGOGASTRODUODENOSCOPY (EGD) WITH PROPOFOL
Anesthesia: Monitor Anesthesia Care

## 2022-12-23 MED ORDER — PROPOFOL 10 MG/ML IV BOLUS
INTRAVENOUS | Status: DC | PRN
  Administered 2022-12-23: 40 mg via INTRAVENOUS
  Administered 2022-12-23: 20 mg via INTRAVENOUS
  Administered 2022-12-23: 30 mg via INTRAVENOUS

## 2022-12-23 MED ORDER — SODIUM CHLORIDE 0.9 % IV SOLN: INTRAVENOUS | Status: DC

## 2022-12-23 MED ORDER — PROPOFOL 500 MG/50ML IV EMUL
INTRAVENOUS | Status: AC
  Filled 2022-12-23: qty 50

## 2022-12-23 MED ORDER — LACTATED RINGERS IV SOLN: INTRAVENOUS | Status: DC

## 2022-12-23 MED ORDER — LIDOCAINE 2% (20 MG/ML) 5 ML SYRINGE
INTRAMUSCULAR | Status: DC | PRN
  Administered 2022-12-23: 80 mg via INTRAVENOUS

## 2022-12-23 SURGICAL SUPPLY — 15 items

## 2022-12-23 NOTE — Interval H&P Note (Signed)
History and Physical Interval Note: 89/female with dysphagia, abnormal Barium swallow suggesting distal esophageal stricture, eliquis on hold for 3 days for an EGD with possible balloon dilation with propofol.  12/23/2022 8:10 AM  [place]  has presented today for EGD with possible balloon dilation with propofol, with the diagnosis of ESOPHAGEAL DYSMOTILITY, DYSPHAGIA, abnormal barium swallow.  The various methods of treatment have been discussed with the patient and family. After consideration of risks, benefits and other options for treatment, the patient has consented to  Procedure(s) with comments: ESOPHAGOGASTRODUODENOSCOPY (EGD) WITH PROPOFOL W/ DILATION (N/A) - with dilation as a surgical intervention.  The patient's history has been reviewed, patient examined, no change in status, stable for surgery.  I have reviewed the patient's chart and labs.  Questions were answered to the patient's satisfaction.     Ronnette Juniper

## 2022-12-23 NOTE — Anesthesia Postprocedure Evaluation (Signed)
Anesthesia Post Note  Patient: Chlora N Lemley  Procedure(s) Performed: ESOPHAGOGASTRODUODENOSCOPY (EGD) WITH PROPOFOL W/ DILATION ESOPHAGEAL DILATION BIOPSY     Patient location during evaluation: PACU Anesthesia Type: MAC Level of consciousness: awake and alert Pain management: pain level controlled Vital Signs Assessment: post-procedure vital signs reviewed and stable Respiratory status: spontaneous breathing, nonlabored ventilation and respiratory function stable Cardiovascular status: blood pressure returned to baseline and stable Postop Assessment: no apparent nausea or vomiting Anesthetic complications: no   No notable events documented.  Last Vitals:  Vitals:   12/23/22 0850 12/23/22 0900  BP: (!) 140/84 (!) 144/89  Pulse: 100 (!) 115  Resp: 16 16  Temp:    SpO2: 95% 92%    Last Pain:  Vitals:   12/23/22 0840  TempSrc: Temporal  PainSc:                  Lynda Rainwater

## 2022-12-23 NOTE — Op Note (Signed)
[facility] Patient Name: Anne Roach Procedure Date: 12/23/2022 MRN: [id number] Attending MD: Ronnette Juniper , MD, [id number] Date of Birth: [birth year] CSN: [id number] Age: 89 Admit Type: Outpatient Procedure:                Upper GI endoscopy Indications:              Dysphagia, Abnormal cine-esophagram/barium swallow                            suspicious for distal esopahgeal stricture Providers:                Ronnette Juniper, MD, Helane Rima, RN, Luan Moore, Technician, [facility], CRNA Referring MD:             Zadie Cleverly Reese,MD Medicines:                Monitored Anesthesia Care Complications:            No immediate complications. Estimated blood loss:                            Minimal. Estimated Blood Loss:     Estimated blood loss was minimal. Procedure:                Pre-Anesthesia Assessment:                           - Prior to the procedure, a History and Physical                            was performed, and patient medications and                            allergies were reviewed. The patient's tolerance of                            previous anesthesia was also reviewed. The risks                            and benefits of the procedure and the sedation                            options and risks were discussed with the patient.                            All questions were answered, and informed consent                            was obtained. Prior Anticoagulants: The patient has                            taken Eliquis (apixaban), last dose was 2 days  prior to procedure. ASA Grade Assessment: III - A                            patient with severe systemic disease. After                            reviewing the risks and benefits, the patient was                            deemed in satisfactory condition to undergo the                            procedure.                            After obtaining informed consent, the endoscope was                            passed under direct vision. Throughout the                            procedure, the patient's blood pressure, pulse, and                            oxygen saturations were monitored continuously. The                            [id number]-H190 ([id number] Olympus endoscope was introduced                            through the mouth, and advanced to the second part                            of duodenum. The upper GI endoscopy was                            accomplished without difficulty. The patient                            tolerated the procedure well. Scope In: Scope Out: Findings:      No endoscopic abnormality was evident in the esophagus to explain the       patient's complaint of dysphagia. It was decided, however, to proceed       with dilation of the lower third of the esophagus. A TTS dilator was       passed through the scope. Dilation with an 18-19-20 mm balloon dilator       was performed to 20 mm for 2 consecutive minutes. The dilation site was       examined following endoscope reinsertion and showed mild mucosal       disruption.      Patchy mildly erythematous mucosa without bleeding was found in the       gastric body and in the gastric antrum. Biopsies were taken with a cold       forceps for Helicobacter pylori testing.  The cardia and gastric fundus were normal on retroflexion.      The examined duodenum was normal. Impression:               - No endoscopic esophageal abnormality to explain                            patient's dysphagia. Esophagus dilated. Dilated.                           - Erythematous mucosa in the gastric body and                            antrum. Biopsied.                           - Normal examined duodenum. Moderate Sedation:      Patient did not receive moderate sedation for this procedure, but       instead received monitored anesthesia care. Recommendation:            - Patient has a contact number available for                            emergencies. The signs and symptoms of potential                            delayed complications were discussed with the                            patient. Return to normal activities tomorrow.                            Written discharge instructions were provided to the                            patient.                           - Resume regular diet.                           - Continue present medications.                           - Await pathology results.                           - Resume Eliquis (apixaban) at prior dose tomorrow. Procedure Code(s):        --- Professional ---                           [phone], Esophagogastroduodenoscopy, flexible,                            transoral; with transendoscopic balloon dilation of  esophagus (less than 30 mm diameter)                           43239, 59, Esophagogastroduodenoscopy, flexible,                            transoral; with biopsy, single or multiple Diagnosis Code(s):        --- Professional ---                           R13.10, Dysphagia, unspecified                           K31.89, Other diseases of stomach and duodenum                           R93.3, Abnormal findings on diagnostic imaging of                            other parts of digestive tract CPT copyright 2022 [employer]. All rights reserved. The codes documented in this report are preliminary and upon coder review may  be revised to meet current compliance requirements. Ronnette Juniper, MD 12/23/2022 8:43:59 AM This report has been signed electronically. Number of Addenda: 0

## 2022-12-23 NOTE — Transfer of Care (Signed)
Immediate Anesthesia Transfer of Care Note  Patient: Anne Roach  Procedure(s) Performed: ESOPHAGOGASTRODUODENOSCOPY (EGD) WITH PROPOFOL W/ DILATION ESOPHAGEAL DILATION BIOPSY  Patient Location: PACU  Anesthesia Type:MAC  Level of Consciousness: awake, alert , and oriented  Airway & Oxygen Therapy: Patient Spontanous Breathing and Patient connected to face mask oxygen  Post-op Assessment: Report given to RN and Post -op Vital signs reviewed and stable  Post vital signs: Reviewed and stable  Last Vitals:  Vitals Value Taken Time  BP    Temp    Pulse    Resp    SpO2      Last Pain:  Vitals:   12/23/22 0800  TempSrc: Temporal  PainSc: 0-No pain         Complications: No notable events documented.

## 2022-12-23 NOTE — Anesthesia Preprocedure Evaluation (Signed)
Anesthesia Evaluation  Patient identified by MRN, date of birth, ID band Patient awake    Reviewed: Allergy & Precautions, H&P , NPO status , Patient's Chart, lab work & pertinent test results  Airway Mallampati: II  TM Distance: >3 FB Neck ROM: Full    Dental no notable dental hx.    Pulmonary sleep apnea    Pulmonary exam normal breath sounds clear to auscultation       Cardiovascular hypertension, Pt. on medications +CHF  Normal cardiovascular exam Rhythm:Regular Rate:Normal     Neuro/Psych negative neurological ROS  negative psych ROS   GI/Hepatic Neg liver ROS,GERD  ,,  Endo/Other  negative endocrine ROSdiabetes, Type 2    Renal/GU Renal InsufficiencyRenal disease  negative genitourinary   Musculoskeletal negative musculoskeletal ROS (+)    Abdominal   Peds negative pediatric ROS (+)  Hematology negative hematology ROS (+)   Anesthesia Other Findings   Reproductive/Obstetrics negative OB ROS                             Anesthesia Physical Anesthesia Plan  ASA: 3  Anesthesia Plan: MAC   Post-op Pain Management: Minimal or no pain anticipated   Induction: Intravenous  PONV Risk Score and Plan: 2 and Ondansetron, Midazolam and Treatment may vary due to age or medical condition  Airway Management Planned: Nasal Cannula  Additional Equipment:   Intra-op Plan:   Post-operative Plan:   Informed Consent: I have reviewed the patients History and Physical, chart, labs and discussed the procedure including the risks, benefits and alternatives for the proposed anesthesia with the patient or authorized representative who has indicated his/her understanding and acceptance.     Dental advisory given  Plan Discussed with: CRNA  Anesthesia Plan Comments:        Anesthesia Quick Evaluation

## 2022-12-24 LAB — SURGICAL PATHOLOGY

## 2022-12-26 ENCOUNTER — Encounter (HOSPITAL_COMMUNITY): Payer: Self-pay | Admitting: Gastroenterology

## 2023-01-14 ENCOUNTER — Other Ambulatory Visit (HOSPITAL_COMMUNITY): Payer: Self-pay | Admitting: *Deleted

## 2023-01-15 ENCOUNTER — Ambulatory Visit (HOSPITAL_COMMUNITY)
Admission: RE | Admit: 2023-01-15 | Discharge: 2023-01-15 | Disposition: A | Discharge status: Home / Self Care | Payer: Medicare HMO | Source: Ambulatory Visit | Attending: Nephrology | Admitting: Nephrology

## 2023-01-15 DIAGNOSIS — D631 Anemia in chronic kidney disease: Secondary | ICD-10-CM | POA: Insufficient documentation

## 2023-01-15 DIAGNOSIS — N189 Chronic kidney disease, unspecified: Secondary | ICD-10-CM | POA: Diagnosis not present

## 2023-01-15 MED ORDER — SODIUM CHLORIDE 0.9 % IV SOLN
510.0000 mg | INTRAVENOUS | Status: DC
  Administered 2023-01-15: 510 mg via INTRAVENOUS
  Filled 2023-01-15: qty 510

## 2023-01-22 ENCOUNTER — Encounter (HOSPITAL_COMMUNITY): Payer: Medicare HMO

## 2023-01-23 ENCOUNTER — Encounter (HOSPITAL_COMMUNITY)
Admission: RE | Admit: 2023-01-23 | Discharge: 2023-01-23 | Disposition: A | Discharge status: Home / Self Care | Payer: Medicare HMO | Source: Ambulatory Visit | Attending: Nephrology | Admitting: Nephrology

## 2023-01-23 DIAGNOSIS — N189 Chronic kidney disease, unspecified: Secondary | ICD-10-CM | POA: Diagnosis present

## 2023-01-23 DIAGNOSIS — D631 Anemia in chronic kidney disease: Secondary | ICD-10-CM | POA: Insufficient documentation

## 2023-01-23 MED ORDER — SODIUM CHLORIDE 0.9 % IV SOLN
510.0000 mg | INTRAVENOUS | Status: DC
  Administered 2023-01-23: 510 mg via INTRAVENOUS
  Filled 2023-01-23: qty 510

## 2023-02-24 ENCOUNTER — Other Ambulatory Visit: Payer: Self-pay | Admitting: Cardiovascular Disease

## 2023-02-24 NOTE — Telephone Encounter (Signed)
Prescription refill request for Eliquis received. Indication: AF Last office visit: 10/29/22  Burna Forts MD Scr: 1.15 on 11/26/22  Epic Age: 89 Weight: 78.7kg  Based on above findings Eliquis 5mg  twice daily is the appropriate dose.  Refill approved.

## 2023-03-27 ENCOUNTER — Other Ambulatory Visit: Payer: Self-pay | Admitting: Cardiovascular Disease

## 2023-03-27 MED ORDER — EMPAGLIFLOZIN 10 MG PO TABS: 10.0000 mg | ORAL_TABLET | Freq: Every day | ORAL | 2 refills | Status: DC

## 2023-04-28 ENCOUNTER — Other Ambulatory Visit: Payer: Self-pay | Admitting: Cardiovascular Disease

## 2023-04-29 ENCOUNTER — Telehealth: Payer: Self-pay | Admitting: Cardiovascular Disease

## 2023-04-29 MED ORDER — METOPROLOL SUCCINATE ER 50 MG PO TB24: 50.0000 mg | ORAL_TABLET | Freq: Every day | ORAL | 1 refills | Status: DC

## 2023-04-29 NOTE — Telephone Encounter (Signed)
Pt's medication was sent to pt's pharmacy as requested. Confirmation received.  °

## 2023-04-29 NOTE — Telephone Encounter (Signed)
 *  STAT* If patient is at the pharmacy, call can be transferred to refill team.   1. Which medications need to be refilled? (please list name of each medication and dose if known)   metoprolol succinate (TOPROL-XL) 50 MG 24 hr tablet    2. Which pharmacy/location (including street and city if local pharmacy) is medication to be sent to? [id number] - [place],  - [address] AT [facility] OF [address]    3. Do they need a 30 day or 90 day supply? 90 days  Pt is out of medications. Need refill today

## 2023-05-24 ENCOUNTER — Other Ambulatory Visit (HOSPITAL_COMMUNITY): Payer: Self-pay | Admitting: Cardiovascular Disease

## 2023-06-09 ENCOUNTER — Other Ambulatory Visit: Payer: Self-pay | Admitting: Family Medicine

## 2023-06-09 ENCOUNTER — Ambulatory Visit
Admission: RE | Admit: 2023-06-09 | Discharge: 2023-06-09 | Disposition: A | Discharge status: Home / Self Care | Payer: Medicare HMO | Source: Ambulatory Visit | Attending: Family Medicine | Admitting: Family Medicine

## 2023-06-09 DIAGNOSIS — R053 Chronic cough: Secondary | ICD-10-CM

## 2023-07-02 ENCOUNTER — Emergency Department (HOSPITAL_COMMUNITY)
Admission: EM | Admit: 2023-07-02 | Discharge: 2023-07-02 | Disposition: A | Discharge status: Home / Self Care | Payer: Medicare HMO | Attending: Emergency Medicine | Admitting: Emergency Medicine

## 2023-07-02 ENCOUNTER — Emergency Department (HOSPITAL_COMMUNITY): Payer: Medicare HMO

## 2023-07-02 ENCOUNTER — Other Ambulatory Visit: Payer: Self-pay

## 2023-07-02 DIAGNOSIS — I110 Hypertensive heart disease with heart failure: Secondary | ICD-10-CM | POA: Diagnosis not present

## 2023-07-02 DIAGNOSIS — I509 Heart failure, unspecified: Secondary | ICD-10-CM | POA: Diagnosis not present

## 2023-07-02 DIAGNOSIS — Z7901 Long term (current) use of anticoagulants: Secondary | ICD-10-CM | POA: Diagnosis not present

## 2023-07-02 DIAGNOSIS — E119 Type 2 diabetes mellitus without complications: Secondary | ICD-10-CM | POA: Insufficient documentation

## 2023-07-02 DIAGNOSIS — R079 Chest pain, unspecified: Secondary | ICD-10-CM | POA: Insufficient documentation

## 2023-07-02 DIAGNOSIS — R1900 Intra-abdominal and pelvic swelling, mass and lump, unspecified site: Secondary | ICD-10-CM | POA: Diagnosis not present

## 2023-07-02 DIAGNOSIS — Z7984 Long term (current) use of oral hypoglycemic drugs: Secondary | ICD-10-CM | POA: Insufficient documentation

## 2023-07-02 DIAGNOSIS — R1013 Epigastric pain: Secondary | ICD-10-CM | POA: Diagnosis present

## 2023-07-02 DIAGNOSIS — E876 Hypokalemia: Secondary | ICD-10-CM | POA: Insufficient documentation

## 2023-07-02 DIAGNOSIS — I4891 Unspecified atrial fibrillation: Secondary | ICD-10-CM | POA: Diagnosis not present

## 2023-07-02 DIAGNOSIS — Z79899 Other long term (current) drug therapy: Secondary | ICD-10-CM | POA: Insufficient documentation

## 2023-07-02 LAB — COMPREHENSIVE METABOLIC PANEL
ALT: 9 U/L (ref 0–44)
AST: 16 U/L (ref 15–41)
Albumin: 2.4 g/dL — ABNORMAL LOW (ref 3.5–5.0)
Alkaline Phosphatase: 44 U/L (ref 38–126)
Anion gap: 9 (ref 5–15)
BUN: 9 mg/dL (ref 8–23)
CO2: 25 mmol/L (ref 22–32)
Calcium: 7.5 mg/dL — ABNORMAL LOW (ref 8.9–10.3)
Chloride: 107 mmol/L (ref 98–111)
Creatinine, Ser: 1 mg/dL (ref 0.44–1.00)
GFR, Estimated: 54 mL/min — ABNORMAL LOW (ref >60)
Glucose, Bld: 121 mg/dL — ABNORMAL HIGH (ref 70–99)
Potassium: 2.7 mmol/L — CL (ref 3.5–5.1)
Sodium: 141 mmol/L (ref 135–145)
Total Bilirubin: 0.4 mg/dL (ref 0.3–1.2)
Total Protein: 5.5 g/dL — ABNORMAL LOW (ref 6.5–8.1)

## 2023-07-02 LAB — CBC WITH DIFFERENTIAL/PLATELET
Abs Immature Granulocytes: 0.02 10*3/uL (ref 0.00–0.07)
Basophils Absolute: 0.1 10*3/uL (ref 0.0–0.1)
Basophils Relative: 1 %
Eosinophils Absolute: 0.3 10*3/uL (ref 0.0–0.5)
Eosinophils Relative: 6 %
HCT: 32.7 % — ABNORMAL LOW (ref 36.0–46.0)
Hemoglobin: 10.4 g/dL — ABNORMAL LOW (ref 12.0–15.0)
Immature Granulocytes: 0 %
Lymphocytes Relative: 41 %
Lymphs Abs: 2.4 10*3/uL (ref 0.7–4.0)
MCH: 32.4 pg (ref 26.0–34.0)
MCHC: 31.8 g/dL (ref 30.0–36.0)
MCV: 101.9 fL — ABNORMAL HIGH (ref 80.0–100.0)
Monocytes Absolute: 0.7 10*3/uL (ref 0.1–1.0)
Monocytes Relative: 12 %
Neutro Abs: 2.3 10*3/uL (ref 1.7–7.7)
Neutrophils Relative %: 40 %
Platelets: 152 10*3/uL (ref 150–400)
RBC: 3.21 MIL/uL — ABNORMAL LOW (ref 3.87–5.11)
RDW: 14.5 % (ref 11.5–15.5)
WBC: 5.9 10*3/uL (ref 4.0–10.5)
nRBC: 0 % (ref 0.0–0.2)

## 2023-07-02 LAB — LIPASE, BLOOD: Lipase: 39 U/L (ref 11–51)

## 2023-07-02 LAB — TROPONIN I (HIGH SENSITIVITY)
Troponin I (High Sensitivity): 10 ng/L (ref <18)
Troponin I (High Sensitivity): 7 ng/L (ref <18)

## 2023-07-02 MED ORDER — IOHEXOL 350 MG/ML SOLN
85.0000 mL | Freq: Once | INTRAVENOUS | Status: AC | PRN
  Administered 2023-07-02: 85 mL via INTRAVENOUS

## 2023-07-02 MED ORDER — POTASSIUM CHLORIDE 10 MEQ/100ML IV SOLN
10.0000 meq | INTRAVENOUS | Status: AC
  Administered 2023-07-02 (×2): 10 meq via INTRAVENOUS
  Filled 2023-07-02 (×2): qty 100

## 2023-07-02 MED ORDER — ONDANSETRON HCL 4 MG/2ML IJ SOLN
4.0000 mg | Freq: Once | INTRAMUSCULAR | Status: AC
  Administered 2023-07-02: 4 mg via INTRAVENOUS
  Filled 2023-07-02: qty 2

## 2023-07-02 MED ORDER — FLUTICASONE PROPIONATE 50 MCG/ACT NA SUSP: 1.0000 | Freq: Every day | NASAL | 0 refills | Status: DC

## 2023-07-02 MED ORDER — FENTANYL CITRATE PF 50 MCG/ML IJ SOSY
50.0000 ug | PREFILLED_SYRINGE | Freq: Once | INTRAMUSCULAR | Status: AC
  Administered 2023-07-02: 50 ug via INTRAVENOUS
  Filled 2023-07-02: qty 1

## 2023-07-02 NOTE — ED Provider Notes (Signed)
[name] EMER[facility]ENC[facility] DEPARTMENT AT [facility] Provider Note   CSN: [id number] Arrival date & time: 07/02/23  1452     History  Chief Complaint  Patient presents with   Chest Pain    Anne Roach is a 89 y.o. female.  Patient is an 89 year old female with a history of chronic A-fib on Eliquis, CHF, diabetes and hypertension who is presenting today with central lower chest and epigastric pain.  Patient reports she was feeling fine today but then had a coughing fit at noon and after that developed the severe pain.  She denies any shortness of breath nausea or vomiting.  Initially she drank some ginger ale and thought it made her feel little bit better but the pain has come back.  It is constant but then it has waves of being stronger.  She has had normal bowel movements has not had any diarrhea or fever.  She does have a history of esophageal dysmotility and her daughter reports that she will oftentimes regurgitate or vomit after eating but she never has pain like this.  She reports her arms and legs feel normal with no paresthesias or weakness.  No recent medication changes and she is compliant with her current meds.  When EMS arrived patient was hypotensive in the 90s which improved with a small bolus of fluid.  The history is provided by the patient.  Chest Pain      Home Medications Prior to Admission medications   Medication Sig Start Date End Date Taking? Authorizing Provider  fluticasone (FLONASE) 50 MCG/ACT nasal spray Place 1 spray into both nostrils daily. 07/02/23  Yes Gwyneth Sprout, MD  ACCU-CHEK AVIVA PLUS test strip  10/06/20   [provider]  acetaminophen (TYLENOL) 500 MG tablet Take 1,000 mg by mouth every 6 (six) hours as needed for mild pain or headache.    [provider]  allopurinol (ZYLOPRIM) 100 MG tablet TAKE 1 TABLET(100 MG) BY MOUTH DAILY 08/09/21   Felecia Shelling, DPM  apixaban (ELIQUIS) 5 MG TABS tablet TAKE 1 TABLET(5 MG)  BY MOUTH TWICE DAILY 02/24/23   Wendall Stade, MD  Cholecalciferol (VITAMIN D) 125 MCG (5000 UT) CAPS Take 5,000 Units by mouth daily with breakfast.    [provider]  donepezil (ARICEPT) 5 MG tablet Take 5 mg by mouth at bedtime.    [provider]  empagliflozin (JARDIANCE) 10 MG TABS tablet Take 1 tablet (10 mg total) by mouth daily. 03/27/23   Wendall Stade, MD  folic acid (FOLVITE) 800 MCG tablet Take 800 mcg by mouth daily.    [provider]  furosemide (LASIX) 40 MG tablet TAKE 1 TABLET(40 MG) BY MOUTH DAILY 05/26/23   Wendall Stade, MD  gabapentin (NEURONTIN) 400 MG capsule Take 400 mg by mouth 3 (three) times daily. 03/25/22   [provider]  lidocaine (XYLOCAINE) 2 % solution Use as directed 10 mLs in the mouth or throat every 6 (six) hours as needed (stomach pain). 11/27/22   Nira Conn, MD  metoprolol succinate (TOPROL-XL) 50 MG 24 hr tablet Take 1 tablet (50 mg total) by mouth daily. Take with or immediately following a meal. 04/29/23   Wendall Stade, MD  Multiple Vitamins-Minerals (ICAPS AREDS 2 PO) Take 1 capsule by mouth 2 (two) times daily.    [provider]  Potassium Chloride ER 20 MEQ TBCR Take 20 mEq by mouth daily. 04/09/22   Sherald Hess, NP  traMADol (ULTRAM) 50 MG tablet Take 1 tablet (50 mg total) by mouth every 12 (twelve) hours as needed. Patient taking differently: Take 50 mg by mouth every 12 (twelve) hours as needed for moderate pain. 04/02/22   Zannie Cove, MD      Allergies    Patient has no known allergies.    Review of Systems   Review of Systems  Cardiovascular:  Positive for chest pain.    Physical Exam Updated Vital Signs BP (!) 105/55   Pulse 77   Temp 98.3 F (36.8 C) (Oral)   Resp 16   Ht 5\' 4"  (1.626 m)   Wt 72.6 kg   SpO2 95%   BMI 27.46 kg/m  Physical Exam Vitals and nursing note reviewed.  Constitutional:      General: She is not in acute distress.    Appearance: She is  well-developed.     Comments: Appears uncomfortable  HENT:     Head: Normocephalic and atraumatic.  Eyes:     Pupils: Pupils are equal, round, and reactive to light.  Cardiovascular:     Rate and Rhythm: Normal rate and regular rhythm.     Heart sounds: Normal heart sounds. No murmur heard.    No friction rub.  Pulmonary:     Effort: Pulmonary effort is normal.     Breath sounds: Normal breath sounds. No wheezing or rales.  Abdominal:     General: Bowel sounds are normal. There is no distension.     Palpations: Abdomen is soft.     Tenderness: There is abdominal tenderness in the epigastric area. There is no guarding or rebound.  Musculoskeletal:        General: No tenderness. Normal range of motion.     Right lower leg: No edema.     Left lower leg: No edema.     Comments: No edema  Skin:    General: Skin is warm and dry.     Findings: No rash.  Neurological:     Mental Status: She is alert and oriented to person, place, and time. Mental status is at baseline.     Cranial Nerves: No cranial nerve deficit.  Psychiatric:        Mood and Affect: Mood normal.        Behavior: Behavior normal.     ED Results / Procedures / Treatments   Labs (all labs ordered are listed, but only abnormal results are displayed) Labs Reviewed  CBC WITH DIFFERENTIAL/PLATELET - Abnormal; Notable for the following components:      Result Value   RBC 3.21 (*)    Hemoglobin 10.4 (*)    HCT 32.7 (*)    MCV 101.9 (*)    All other components within normal limits  COMPREHENSIVE METABOLIC PANEL - Abnormal; Notable for the following components:   Potassium 2.7 (*)    Glucose, Bld 121 (*)    Calcium 7.5 (*)    Total Protein 5.5 (*)    Albumin 2.4 (*)    GFR, Estimated 54 (*)    All other components within normal limits  LIPASE, BLOOD  TROPONIN I (HIGH SENSITIVITY)  TROPONIN I (HIGH SENSITIVITY)    EKG EKG Interpretation Date/Time:  Thursday July 02 2023 20:24:55 EDT Ventricular Rate:   95 PR Interval:    QRS Duration:  95 QT Interval:  442 QTC Calculation: 523 R Axis:   -12  Text Interpretation: Atrial fibrillation Anteroseptal infarct, age indeterminate new Prolonged QT interval Confirmed by  Gwyneth Sprout ([id number]) on 07/02/2023 8:27:20 PM  Radiology CT Angio Chest/Abd/Pel for Dissection W and/or Wo Contrast  Result Date: 07/02/2023 CLINICAL DATA:  Chest/abdominal pain radiating to back EXAM: CT ANGIOGRAPHY CHEST, ABDOMEN AND PELVIS TECHNIQUE: Non-contrast CT of the chest was initially obtained. Multidetector CT imaging through the chest, abdomen and pelvis was performed using the standard protocol during bolus administration of intravenous contrast. Multiplanar reconstructed images and MIPs were obtained and reviewed to evaluate the vascular anatomy. RADIATION DOSE REDUCTION: This exam was performed according to the departmental dose-optimization program which includes automated exposure control, adjustment of the mA and/or kV according to patient size and/or use of iterative reconstruction technique. CONTRAST:  85mL OMNIPAQUE IOHEXOL 350 MG/ML SOLN COMPARISON:  CT abdomen dated 11/26/2022. CT chest dated 03/30/2022. CT abdomen/pelvis dated 01/18/2021. FINDINGS: CTA CHEST FINDINGS Cardiovascular: On unenhanced CT, there is no evidence of intramural hematoma. Following contrast administration, there is no evidence of thoracic aortic aneurysm or dissection. Atherosclerotic calcifications of the aortic arch. Study is not tailored for evaluation of the pulmonary arteries. No evidence of central pulmonary embolism. Cardiomegaly.  Small pericardial effusion, chronic. Mediastinum/Nodes: Mild mediastinal lymphadenopathy, including a dominant 2.0 cm short axis right azygoesophageal recess node (series 6/image 61), previously 1.8 cm in January 2024. Visualized thyroid is unremarkable. Lungs/Pleura: Mild patchy opacities in the bilateral lower lobes, favoring atelectasis. Small left pleural  effusion, mildly increased from January 2024. However, the prior moderate right pleural effusion has resolved. No suspicious pulmonary nodules. No pleural effusion or pneumothorax. Musculoskeletal: Degenerative changes of the thoracic spine. Review of the MIP images confirms the above findings. 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 Aorta measures 2.1 x 1.9 cm at this level. Atherosclerotic calcifications of the abdominal aorta. Patent. Celiac: Patent.  Atherosclerotic calcifications the origin. SMA: Patent.  Atherosclerotic calcifications at the origin. Renals: Patent bilaterally. Atherosclerotic calcifications at the origin. IMA: Patent. Inflow: Patent bilaterally.  Atherosclerotic calcifications. Veins: Grossly unremarkable. Review of the MIP images confirms the above findings. NON-VASCULAR Hepatobiliary: Liver is within normal limits. Gallbladder is unremarkable. No intrahepatic or extrahepatic ductal dilatation. Pancreas: Coarse parenchymal calcifications in the uncinate process. Adjacent 2.0 x 1.5 cm complex cystic lesion in the pancreatic head/uncinate process (series 6/image 181), grossly unchanged. Notably, this was present on remote priors, suggesting a benign etiology. Given the patient's age, no follow-up is recommended. Spleen: Stomach is within normal limits. Adrenals/Urinary Tract: Adrenal glands are within normal limits. Bilateral renal cysts, including a dominant 2.1 cm simple cyst in the posterior interpolar right kidney (series 6/image 150), benign (Bosniak I). No follow-up is recommended. No hydronephrosis. Bladder is within normal limits. Stomach/Bowel: Stomach is within normal limits. No evidence of bowel obstruction. Appendix is not discretely visualized. 4.0 x 3.1 cm probable enhancing anterior cecal mass (series 6/image 225), highly  suspicious for colonic adenocarcinoma. No convincing pericolonic extension. Lymphatic: Ileocolic nodes measuring up to 13 mm short axis (series 6/image 218), suggesting regional nodal metastases. Reproductive: Uterus is grossly unremarkable. Bilateral ovaries are within normal limits Other: No abdominopelvic ascites. Musculoskeletal: Degenerative changes of the lumbar spine. Review of the MIP images confirms the above findings. IMPRESSION: No evidence of thoracic aortic aneurysm or dissection. 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 4.0 cm anterior cecal mass, highly suspicious for colonic adenocarcinoma. No convincing pericolonic extension. Ileocolic nodes measuring up to 13 mm  short axis, suggesting regional nodal metastases. Mild mediastinal lymphadenopathy, indeterminate. Electronically Signed   By: Charline Bills M.D.   On: 07/02/2023 19:17   DG Chest Port 1 View  Result Date: 07/02/2023 CLINICAL DATA:  Chest/epigastric pain EXAM: PORTABLE CHEST 1 VIEW COMPARISON:  06/09/2023 FINDINGS: Cardiomegaly. No frank interstitial edema. No pleural effusion or pneumothorax. Thoracic aortic atherosclerosis. IMPRESSION: Cardiomegaly. No acute cardiopulmonary disease. Electronically Signed   By: Charline Bills M.D.   On: 07/02/2023 17:18    Procedures Procedures    Medications Ordered in ED Medications  fentaNYL (SUBLIMAZE) injection 50 mcg (50 mcg Intravenous Given 07/02/23 1534)  ondansetron (ZOFRAN) injection 4 mg (4 mg Intravenous Given 07/02/23 1533)  potassium chloride 10 mEq in 100 mL IVPB (0 mEq Intravenous Stopped 07/02/23 1919)  iohexol (OMNIPAQUE) 350 MG/ML injection 85 mL (85 mLs Intravenous Contrast Given 07/02/23 1743)    ED Course/ Medical Decision Making/ A&P                                 Medical Decision Making Amount and/or Complexity of Data Reviewed Labs: ordered. Decision-making details  documented in ED Course. Radiology: ordered and independent interpretation performed. Decision-making details documented in ED Course. ECG/medicine tests: ordered and independent interpretation performed. Decision-making details documented in ED Course.  Risk Prescription drug management.   Pt with multiple medical problems and comorbidities and presenting today with a complaint that caries a high risk for morbidity and mortality.  Patient here today with abrupt onset of severe epigastric/lower chest pain that radiates into her back.  It this started after a coughing spell.  Concern for GI pathology such as esophageal spasm, bowel obstruction or perforation versus dissection.  Low suspicion for PE as patient takes anticoagulants and does not have history suggestive of that.  Also concern for possible pneumothorax.  Low suspicion for esophageal rupture.  Symptoms also could be ACS however would be atypical.  Patient initially was hypotensive but blood pressure on multiple reads here has been normal.  Patient given pain control.  Labs and imaging are pending.  8:28 PM I independently interpreted patient's labs and EKG and troponin x 2 are negative, CBC with improved hemoglobin of 10 and normal white count, CMP with hypokalemia at 2.7 normal renal function and electrolytes otherwise, lipase within normal limits.  EKG with atrial fibrillation but unchanged otherwise. I have independently visualized and interpreted pt's images today. Chest x-ray showed cardiomegaly.  Patient has had prior history of pericardial effusion and pleural effusions.  CTA to rule out dissection or abdominal process pending.  Patient improved after pain medication now reports the pain is gone.  Potassium replaced.  CT today shows no evidence of dissection or PE and patient is anticoagulated so that would be unusual.  There is a small focal dissection versus pseudoaneurysm along the left infrarenal abdominal aorta which is unchanged  from January patient has a new 4 cm anterior cecal mass highly suspicious for colonic adenocarcinoma with ileocecal nodes and some mild mediastinal lymphadenopathy.  Findings discussed with the patient and her daughter who is present at bedside.  At this time do not feel that patient requires admission or further testing but will need to increase her potassium follow-up with her PCP next week for repeat potassium check but was also referred to oncology for further evaluation of this new cecal mass.  Patient and her daughter are understanding of the test results and feel  comfortable going home.         Final Clinical Impression(s) / ED Diagnoses Final diagnoses:  Epigastric pain  Hypokalemia  Abdominal mass, unspecified abdominal location    Rx / DC Orders ED Discharge Orders          Ordered    fluticasone (FLONASE) 50 MCG/ACT nasal spray  Daily        07/02/23 2017    Ambulatory referral to Hematology / Oncology       Comments: Pt with new cecal mass and lymph nodes   07/02/23 2017              Gwyneth Sprout, MD 07/02/23 2028

## 2023-07-02 NOTE — Discharge Instructions (Addendum)
Increase your potassium at home to 2 tablets a day until you follow-up with your doctor next week for a recheck.  You can also increase the foods that have potassium in your diet.  No evidence of blood clots or heart attack today.  Oncology should call you for follow-up about the mass in your intestine.  If you start having worsening pain, persistent vomiting, trouble catching your breath return to the emergency room.

## 2023-07-02 NOTE — ED Triage Notes (Signed)
Patient from home with central chest pain with radiation to back for approximately one hour. States has been constant since onset. Hypotensive 90/60 with EMS which improved with NS bolus.

## 2023-07-02 NOTE — ED Notes (Signed)
Patient transported to CT 

## 2023-07-03 NOTE — Progress Notes (Signed)
I spoke with [name] daughter Anne Roach.  They are agreeable for appt with Dr Mosetta Putt on 07/07/2023 at 1500.  She is aware of our location.  I asked that they arrive at 1445 for registration purposes.  All questions were answered.  She verbalized understanding.

## 2023-07-07 ENCOUNTER — Inpatient Hospital Stay: Payer: Medicare HMO

## 2023-07-07 ENCOUNTER — Inpatient Hospital Stay: Payer: Medicare HMO | Attending: Hematology | Admitting: Hematology

## 2023-07-07 ENCOUNTER — Encounter: Payer: Self-pay | Admitting: Hematology

## 2023-07-07 VITALS — BP 94/47 | HR 73 | Temp 98.0°F | Resp 16 | Ht 64.0 in | Wt 153.9 lb

## 2023-07-07 DIAGNOSIS — Z803 Family history of malignant neoplasm of breast: Secondary | ICD-10-CM | POA: Diagnosis not present

## 2023-07-07 DIAGNOSIS — K6389 Other specified diseases of intestine: Secondary | ICD-10-CM

## 2023-07-07 DIAGNOSIS — K117 Disturbances of salivary secretion: Secondary | ICD-10-CM | POA: Insufficient documentation

## 2023-07-07 DIAGNOSIS — R634 Abnormal weight loss: Secondary | ICD-10-CM | POA: Diagnosis not present

## 2023-07-07 DIAGNOSIS — R110 Nausea: Secondary | ICD-10-CM | POA: Insufficient documentation

## 2023-07-07 DIAGNOSIS — D500 Iron deficiency anemia secondary to blood loss (chronic): Secondary | ICD-10-CM

## 2023-07-07 DIAGNOSIS — D649 Anemia, unspecified: Secondary | ICD-10-CM | POA: Diagnosis not present

## 2023-07-07 DIAGNOSIS — D374 Neoplasm of uncertain behavior of colon: Secondary | ICD-10-CM | POA: Insufficient documentation

## 2023-07-07 DIAGNOSIS — R1906 Epigastric swelling, mass or lump: Secondary | ICD-10-CM | POA: Diagnosis present

## 2023-07-07 LAB — CMP (CANCER CENTER ONLY)
ALT: 6 U/L (ref 0–44)
AST: 14 U/L — ABNORMAL LOW (ref 15–41)
Albumin: 3.3 g/dL — ABNORMAL LOW (ref 3.5–5.0)
Alkaline Phosphatase: 50 U/L (ref 38–126)
Anion gap: 6 (ref 5–15)
BUN: 11 mg/dL (ref 8–23)
CO2: 32 mmol/L (ref 22–32)
Calcium: 9 mg/dL (ref 8.9–10.3)
Chloride: 104 mmol/L (ref 98–111)
Creatinine: 1.16 mg/dL — ABNORMAL HIGH (ref 0.44–1.00)
GFR, Estimated: 45 mL/min — ABNORMAL LOW (ref >60)
Glucose, Bld: 87 mg/dL (ref 70–99)
Potassium: 4.4 mmol/L (ref 3.5–5.1)
Sodium: 142 mmol/L (ref 135–145)
Total Bilirubin: 0.5 mg/dL (ref 0.3–1.2)
Total Protein: 6.8 g/dL (ref 6.5–8.1)

## 2023-07-07 NOTE — Progress Notes (Signed)
I met with Anne Roach and her daughter, Anne Roach after her consultation with Dr Mosetta Putt.  I explained my role as a nurse navigator and provided my contact information.  Anne Roach requested a second opinion at [facility].  I advised her it would be best to wait until we have definitive diagnosis of colon cancer.  I told her at that time I can facilitated that request.

## 2023-07-07 NOTE — Progress Notes (Signed)
Anne Roach   [phone] [phone]   Clinic New Consult Note   Patient Care Team: Leilani Able, MD as PCP - General (Family Medicine) Wendall Stade, MD as PCP - Cardiology (Cardiology)  Date of Service:  07/07/2023   CHIEF COMPLAINTS/PURPOSE OF CONSULTATION:  Abdominal Mass  REFERRING PHYSICIAN:  Gwyneth Sprout, MD   ASSESSMENT & PLAN:  Anne Roach is a 89 y.o.  female with a history of   1.  Cecal mass, concerning for colon cancer -I personally reviewed her CT scan from July 02, 2023, which showed a 4 cm cecal mass, and mild regional adenopathy concerning for nodal metastasis. -I recommend colonoscopy for tissue diagnosis.   -If the cecal mass biopsy confirmed adenocarcinoma, will check MRI to see if she is a candidate for immunotherapy. -If she does have colon cancer, we will refer her to colorectal surgeon to consider right hemicolectomy.  She is elderly and has multiple comorbidities, but she is probably still able to tolerate surgery. -No scan evidence of distant metastasis. -She would not be a candidate for adjuvant chemotherapy if she had stage III disease, patient is not interested in chemo either. -We discussed the colon cancer surveillance after surgery.  She is willing to follow-up after surgery. -Will check tumor markers CEA today.  2.  Anemia -Ongoing for at least a few years (since latel 2021), worse lately -Probably related to her colon cancer -Will check her ferritin level today to see if she needs IV iron.  I encouraged her to start oral iron pill.      PLAN:  -Reviewed CT scan with pt and daughter -recommend Colonoscopy for further evaluation - I will reach out to GI Dr.Karki for colonoscopy  -repeat CMP -repeat CEA -I recommend Iron Study -lab and f/u in 1 month   HISTORY OF PRESENTING ILLNESS:  Anne Roach 89 y.o. female is a here because of Abdominal Mass. The patient was referred by Anne Sprout, MD . The patient presents to the clinic today accompanied by daughter.  She has been complaining of excessive saliva/mucus secretion after eating meals, which makes her feel nauseated and he wants to throw up or spit, for the past 6 months.  She was seen by GI and had EGD in February 2024.  She had a mild stricture in the esophagus which was dilated, otherwise was negative.  She also had episode of epigastric pain and went to emergency room on July 02, 2023, CT scan of chest, abdomen pelvis was obtained which showed a 4 cm mass in the cecum, with regional adenopathy, no distant metastasis.  Patient was referred to [place] for further evaluation.  Pt state that she has no recurrent pain. Her daughter state that she has some nausea and has phlegm.This has been going since the beginning of the year 2024. Pt denies having any heart burn. Daughter state that the pt appetite is good. She has some weight loss.Pt has mid chest pain and went to the ED and was diagnose with low potassium. Pt has BM everyday and its dark in color.   She has a PMHx of.... Hypertension Diabetic Mellitus CHF Tremor   Daughter-breast cancer (55 yr)  Socially... Widowed 2 children , 1 decease   REVIEW OF SYSTEMS:    Constitutional:(-) Denies fevers, chills or abnormal night sweats Eyes:(-) Denies blurriness of vision, double vision or watery eyes Ears, nose, mouth, throat, and face:(+) Denies mucositis or sore throat Respiratory:(-)  Denies cough,  dyspnea or wheezes Cardiovascular: (-)Denies palpitation, chest discomfort or lower extremity swelling Gastrointestinal:  (+) nausea, heartburn or change in bowel habits Skin: Denies abnormal skin rashes Lymphatics:(-) Denies new lymphadenopathy or easy bruising Neurological:(-)Denies numbness, tingling or new weaknesses Behavioral/Psych:(-) Mood is stable, no new changes  All other systems were reviewed with the patient and are negative.   MEDICAL HISTORY:  Past  Medical History:  Diagnosis Date   Acute on chronic diastolic CHF (congestive heart failure) (HCC) 03/27/2022   Atrial fibrillation (HCC)    Atrial fibrillation with RVR (HCC) 03/20/2017   Chronic diastolic CHF (congestive heart failure) (HCC) 10/31/2020   DM (diabetes mellitus) (HCC)    HTN (hypertension)    PNA (pneumonia) 10/31/2020   Sepsis secondary to UTI (HCC) 07/11/2020   Tremor     SURGICAL HISTORY: Past Surgical History:  Procedure Laterality Date   APPENDECTOMY     BIOPSY  12/23/2022   Procedure: BIOPSY;  Surgeon: Kerin Salen, MD;  Location: [facility];  Service: Gastroenterology;;   CARPAL TUNNEL RELEASE     CATARACT EXTRACTION Bilateral    ESOPHAGEAL DILATION  12/23/2022   Procedure: ESOPHAGEAL DILATION;  Surgeon: Kerin Salen, MD;  Location: [facility];  Service: Gastroenterology;;   ESOPHAGOGASTRODUODENOSCOPY (EGD) WITH PROPOFOL N/A 12/23/2022   Procedure: ESOPHAGOGASTRODUODENOSCOPY (EGD) WITH PROPOFOL W/ DILATION;  Surgeon: Kerin Salen, MD;  Location: [facility];  Service: Gastroenterology;  Laterality: N/A;  with dilation   HERNIA REPAIR      SOCIAL HISTORY: Social History   Socioeconomic History   Marital status: Widowed    Spouse name: Not on file   Number of children: 2   Years of education: 12   Highest education level: High school graduate  Occupational History    Comment: Retired    Comment: retired  Tobacco Use   Smoking status: Never   Smokeless tobacco: Never  Vaping Use   Vaping status: Never Used  Substance and Sexual Activity   Alcohol use: No    Alcohol/week: 0.0 standard drinks of alcohol   Drug use: No   Sexual activity: Not on file  Other Topics Concern   Not on file  Social History Narrative   Speaks English and Arabic. Patient lives at home with her daughter Anne Roach). Patient is widowed.   Patient is retired.   Education high school.   Right handed.   Two children.   Caffeine None   Social Determinants of Health    Financial Resource Strain: Low Risk  (03/28/2022)   Overall Financial Resource Strain (CARDIA)    Difficulty of Paying Living Expenses: Not hard at all  Food Insecurity: No Food Insecurity (11/24/2022)   Hunger Vital Sign    Worried About Running Out of Food in the Last Year: Never true    Ran Out of Food in the Last Year: Never true  Transportation Needs: No Transportation Needs (11/24/2022)   PRAPARE - Administrator, Civil Service (Medical): No    Lack of Transportation (Non-Medical): No  Physical Activity: Not on file  Stress: Not on file  Social Connections: Not on file  Intimate Partner Violence: Not At Risk (11/24/2022)   Humiliation, Afraid, Rape, and Kick questionnaire    Fear of Current or Ex-Partner: No    Emotionally Abused: No    Physically Abused: No    Sexually Abused: No    FAMILY HISTORY: Family History  Problem Relation Age of Onset   Diabetes Father    Asthma Brother  Cancer Daughter 43       BREAST CANCER    ALLERGIES:  has No Known Allergies.  MEDICATIONS:  Current Outpatient Medications  Medication Sig Dispense Refill   ACCU-CHEK AVIVA PLUS test strip      acetaminophen (TYLENOL) 500 MG tablet Take 1,000 mg by mouth every 6 (six) hours as needed for mild pain or headache.     allopurinol (ZYLOPRIM) 100 MG tablet TAKE 1 TABLET(100 MG) BY MOUTH DAILY 90 tablet 2   apixaban (ELIQUIS) 5 MG TABS tablet TAKE 1 TABLET(5 MG) BY MOUTH TWICE DAILY 60 tablet 5   Cholecalciferol (VITAMIN D) 125 MCG (5000 UT) CAPS Take 5,000 Units by mouth daily with breakfast.     donepezil (ARICEPT) 5 MG tablet Take 5 mg by mouth at bedtime.     empagliflozin (JARDIANCE) 10 MG TABS tablet Take 1 tablet (10 mg total) by mouth daily. 90 tablet 2   fluticasone (FLONASE) 50 MCG/ACT nasal spray Place 1 spray into both nostrils daily. 16 each 0   folic acid (FOLVITE) 800 MCG tablet Take 800 mcg by mouth daily.     furosemide (LASIX) 40 MG tablet TAKE 1 TABLET(40 MG) BY  MOUTH DAILY 90 tablet 1   gabapentin (NEURONTIN) 400 MG capsule Take 400 mg by mouth 3 (three) times daily.     lidocaine (XYLOCAINE) 2 % solution Use as directed 10 mLs in the mouth or throat every 6 (six) hours as needed (stomach pain). 100 mL 0   metoprolol succinate (TOPROL-XL) 50 MG 24 hr tablet Take 1 tablet (50 mg total) by mouth daily. Take with or immediately following a meal. 90 tablet 1   Multiple Vitamins-Minerals (ICAPS AREDS 2 PO) Take 1 capsule by mouth 2 (two) times daily.     Potassium Chloride ER 20 MEQ TBCR Take 20 mEq by mouth daily. 60 tablet    traMADol (ULTRAM) 50 MG tablet Take 1 tablet (50 mg total) by mouth every 12 (twelve) hours as needed. (Patient taking differently: Take 50 mg by mouth every 12 (twelve) hours as needed for moderate pain.)     No current facility-administered medications for this visit.    PHYSICAL EXAMINATION: ECOG PERFORMANCE STATUS: 2 - Symptomatic, <50% confined to bed  Vitals:   07/07/23 1442  BP: (!) 94/47  Pulse: 73  Resp: 16  Temp: 98 F (36.7 C)  SpO2: 96%   Filed Weights   07/07/23 1442  Weight: 153 lb 14.4 oz (69.8 kg)     GENERAL:alert, no distress and comfortable SKIN: skin color normal, no rashes or significant lesions EYES: normal, Conjunctiva are pink and non-injected, sclera clear  NEURO: alert & oriented x 3 with fluent speech GENERAL:alert, no distress and comfortable SKIN: skin color, texture, turgor are normal, no rashes or significant lesions EYES: normal, Conjunctiva are pink and non-injected, sclera clear NECK:(-) supple, thyroid normal size, non-tender, without nodularity LYMPH: (-) no palpable lymphadenopathy in the cervical, axillary  LUNGS: (-)clear to auscultation and percussion with normal breathing effort HEART:(-) regular rate & rhythm and no murmurs and no lower extremity edema (-) Abnormal bowel sounds  LABORATORY DATA:  I have reviewed the data as listed    Latest Ref Rng & Units 07/02/2023     3:37 PM 11/26/2022   11:49 PM 11/25/2022    9:14 AM  CBC  WBC 4.0 - 10.5 K/uL 5.9  8.3  7.9   Hemoglobin 12.0 - 15.0 g/dL 16.1  8.3  8.1   Hematocrit 36.0 -  46.0 % 32.7  29.6  28.0   Platelets 150 - 400 K/uL 152  220  214        Latest Ref Rng & Units 07/07/2023    3:18 PM 07/02/2023    3:37 PM 11/26/2022   11:49 PM  CMP  Glucose 70 - 99 mg/dL 87  433  295   BUN 8 - 23 mg/dL 11  9  12    Creatinine 0.44 - 1.00 mg/dL 1.88  4.16  6.06   Sodium 135 - 145 mmol/L 142  141  137   Potassium 3.5 - 5.1 mmol/L 4.4  2.7  4.1   Chloride 98 - 111 mmol/L 104  107  108   CO2 22 - 32 mmol/L 32  25  20   Calcium 8.9 - 10.3 mg/dL 9.0  7.5  8.5   Total Protein 6.5 - 8.1 g/dL 6.8  5.5  7.1   Total Bilirubin 0.3 - 1.2 mg/dL 0.5  0.4  0.5   Alkaline Phos 38 - 126 U/L 50  44  38   AST 15 - 41 U/L 14  16  17    ALT 0 - 44 U/L 6  9  9       RADIOGRAPHIC STUDIES: I have personally reviewed the radiological images as listed and agreed with the findings in the report. CT Angio Chest/Abd/Pel for Dissection W and/or Wo Contrast  Result Date: 07/02/2023 CLINICAL DATA:  Chest/abdominal pain radiating to back EXAM: CT ANGIOGRAPHY CHEST, ABDOMEN AND PELVIS TECHNIQUE: Non-contrast CT of the chest was initially obtained. Multidetector CT imaging through the chest, abdomen and pelvis was performed using the standard protocol during bolus administration of intravenous contrast. Multiplanar reconstructed images and MIPs were obtained and reviewed to evaluate the vascular anatomy. RADIATION DOSE REDUCTION: This exam was performed according to the departmental dose-optimization program which includes automated exposure control, adjustment of the mA and/or kV according to patient size and/or use of iterative reconstruction technique. CONTRAST:  85mL OMNIPAQUE IOHEXOL 350 MG/ML SOLN COMPARISON:  CT abdomen dated 11/26/2022. CT chest dated 03/30/2022. CT abdomen/pelvis dated 01/18/2021. FINDINGS: CTA CHEST FINDINGS Cardiovascular:  On unenhanced CT, there is no evidence of intramural hematoma. Following contrast administration, there is no evidence of thoracic aortic aneurysm or dissection. Atherosclerotic calcifications of the aortic arch. Study is not tailored for evaluation of the pulmonary arteries. No evidence of central pulmonary embolism. Cardiomegaly.  Small pericardial effusion, chronic. Mediastinum/Nodes: Mild mediastinal lymphadenopathy, including a dominant 2.0 cm short axis right azygoesophageal recess node (series 6/image 61), previously 1.8 cm in January 2024. Visualized thyroid is unremarkable. Lungs/Pleura: Mild patchy opacities in the bilateral lower lobes, favoring atelectasis. Small left pleural effusion, mildly increased from January 2024. However, the prior moderate right pleural effusion has resolved. No suspicious pulmonary nodules. No pleural effusion or pneumothorax. Musculoskeletal: Degenerative changes of the thoracic spine. Review of the MIP images confirms the above findings. 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 Aorta measures 2.1 x 1.9 cm at this level. Atherosclerotic calcifications of the abdominal aorta. Patent. Celiac: Patent.  Atherosclerotic calcifications the origin. SMA: Patent.  Atherosclerotic calcifications at the origin. Renals: Patent bilaterally. Atherosclerotic calcifications at the origin. IMA: Patent. Inflow: Patent bilaterally.  Atherosclerotic calcifications. Veins: Grossly unremarkable. Review of the MIP images confirms the above findings. NON-VASCULAR Hepatobiliary: Liver is within normal limits. Gallbladder is unremarkable. No intrahepatic or extrahepatic ductal dilatation. Pancreas:  Coarse parenchymal calcifications in the uncinate process. Adjacent 2.0 x 1.5 cm complex cystic lesion in the pancreatic head/uncinate process (series  6/image 181), grossly unchanged. Notably, this was present on remote priors, suggesting a benign etiology. Given the patient's age, no follow-up is recommended. Spleen: Stomach is within normal limits. Adrenals/Urinary Tract: Adrenal glands are within normal limits. Bilateral renal cysts, including a dominant 2.1 cm simple cyst in the posterior interpolar right kidney (series 6/image 150), benign (Bosniak I). No follow-up is recommended. No hydronephrosis. Bladder is within normal limits. Stomach/Bowel: Stomach is within normal limits. No evidence of bowel obstruction. Appendix is not discretely visualized. 4.0 x 3.1 cm probable enhancing anterior cecal mass (series 6/image 225), highly suspicious for colonic adenocarcinoma. No convincing pericolonic extension. Lymphatic: Ileocolic nodes measuring up to 13 mm short axis (series 6/image 218), suggesting regional nodal metastases. Reproductive: Uterus is grossly unremarkable. Bilateral ovaries are within normal limits Other: No abdominopelvic ascites. Musculoskeletal: Degenerative changes of the lumbar spine. Review of the MIP images confirms the above findings. IMPRESSION: No evidence of thoracic aortic aneurysm or dissection. 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 4.0 cm anterior cecal mass, highly suspicious for colonic adenocarcinoma. No convincing pericolonic extension. Ileocolic nodes measuring up to 13 mm short axis, suggesting regional nodal metastases. Mild mediastinal lymphadenopathy, indeterminate. Electronically Signed   By: Charline Bills M.D.   On: 07/02/2023 19:17   DG Chest Port 1 View  Result Date: 07/02/2023 CLINICAL DATA:  Chest/epigastric pain EXAM: PORTABLE CHEST 1 VIEW COMPARISON:  06/09/2023 FINDINGS: Cardiomegaly. No frank interstitial edema. No pleural effusion or pneumothorax. Thoracic aortic atherosclerosis. IMPRESSION: Cardiomegaly. No acute  cardiopulmonary disease. Electronically Signed   By: Charline Bills M.D.   On: 07/02/2023 17:18   DG Chest 2 View  Result Date: 06/15/2023 CLINICAL DATA:  Persistent cough for the past week. EXAM: CHEST - 2 VIEW COMPARISON:  11/23/2022 FINDINGS: Stable enlarged cardiac silhouette. Small amount of left pleural thickening or fluid. Clear lungs with normal vascularity. Thoracic spine degenerative changes including changes of DISH. IMPRESSION: 1. Small amount of left pleural thickening or fluid. 2. Stable cardiomegaly. 3. No acute abnormality. Electronically Signed   By: Beckie Salts M.D.   On: 06/15/2023 13:06     Orders Placed This Encounter  Procedures   CMP (Cancer Roach only)    Standing Status:   Future    Number of Occurrences:   1    Standing Expiration Date:   07/06/2024   CEA (Access)-[facility] ONLY    Standing Status:   Standing    Number of Occurrences:   30    Standing Expiration Date:   07/06/2024   Ferritin    Standing Status:   Standing    Number of Occurrences:   20    Standing Expiration Date:   07/06/2024    All questions were answered. The patient knows to call the clinic with any problems, questions or concerns. The total time spent in the appointment was 45 minutes.     Malachy Mood, MD 07/07/2023 5:04 PM  I, Monica Martinez am acting as scribe for Malachy Mood, MD.   I have reviewed the above documentation for accuracy and completeness, and I agree with the above.

## 2023-07-08 ENCOUNTER — Encounter: Payer: Self-pay | Admitting: Hematology

## 2023-07-08 LAB — FERRITIN: Ferritin: 84 ng/mL (ref 11–307)

## 2023-07-08 LAB — CEA (ACCESS): CEA (CHCC): 3.16 ng/mL (ref 0.00–5.00)

## 2023-07-08 NOTE — Progress Notes (Signed)
Per request office notes were faxed to PCP Dr. Leilani Able at [phone] confirmation was received

## 2023-07-28 ENCOUNTER — Other Ambulatory Visit: Payer: Self-pay | Admitting: Gastroenterology

## 2023-07-29 ENCOUNTER — Telehealth (HOSPITAL_BASED_OUTPATIENT_CLINIC_OR_DEPARTMENT_OTHER): Payer: Self-pay | Admitting: *Deleted

## 2023-07-29 ENCOUNTER — Telehealth: Payer: Self-pay | Admitting: *Deleted

## 2023-07-29 ENCOUNTER — Ambulatory Visit: Payer: Medicare HMO | Attending: General Practice

## 2023-07-29 DIAGNOSIS — Z01810 Encounter for preprocedural cardiovascular examination: Secondary | ICD-10-CM | POA: Diagnosis not present

## 2023-07-29 NOTE — Telephone Encounter (Signed)
Patient with diagnosis of PAF on Eliquis for anticoagulation.    Procedure: Colonoscopy  Date of procedure: 08/03/2023   CHA2DS2-VASc Score = 7   This indicates a 11.2% annual risk of stroke. The patient's score is based upon: CHF History: 1 HTN History: 1 Diabetes History: 1 Stroke History: 0 Vascular Disease History: 1 Age Score: 2 Gender Score: 1  CrCl 36 mL/min  Platelet count 152    Per office protocol, patient can hold Eliquis for 1-2 days prior to procedure.    **This guidance is not considered finalized until pre-operative APP has relayed final recommendations.**

## 2023-07-29 NOTE — Telephone Encounter (Signed)
S/w the DPR pt's daughter Anne Roach. In speaking with Anne Roach it was noted that [name] has passed away about 5 yrs ago. I advised they will need to fill out a new DPR showing just her name on the DPR, as her sister passed away. Pt has been added on today ok per pre op APP Edd Fabian, FNP due to procedure date 08/03/23 and med hold.    Add on today 07/29/23 3 pm.      Med rec and consent are done.

## 2023-07-29 NOTE — Telephone Encounter (Signed)
S/w the DPR pt's daughter Anne Roach. In speaking with Anne Roach it was noted that [name] has passed away about 5 yrs ago. I advised they will need to fill out a new DPR showing just her name on the DPR, as her sister passed away. Pt has been added on today ok per pre op APP Edd Fabian, FNP due to procedure date 08/03/23 and med hold.   Add on today 07/29/23 3 pm.    Med rec and consent are done.     Patient Consent for Virtual Visit        Anne Roach has provided verbal consent on 07/29/2023 for a virtual visit (video or telephone).   CONSENT FOR VIRTUAL VISIT FOR:  Anne Roach  By participating in this virtual visit I agree to the following:  I hereby voluntarily request, consent and authorize [facility] and its employed or contracted physicians, physician assistants, nurse practitioners or other licensed health care professionals (the Practitioner), to provide me with telemedicine health care services (the "Services") as deemed necessary by the treating Practitioner. I acknowledge and consent to receive the Services by the Practitioner via telemedicine. I understand that the telemedicine visit will involve communicating with the Practitioner through live audiovisual communication technology and the disclosure of certain medical information by electronic transmission. I acknowledge that I have been given the opportunity to request an in-person assessment or other available alternative prior to the telemedicine visit and am voluntarily participating in the telemedicine visit.  I understand that I have the right to withhold or withdraw my consent to the use of telemedicine in the course of my care at any time, without affecting my right to future care or treatment, and that the Practitioner or I may terminate the telemedicine visit at any time. I understand that I have the right to inspect all information obtained and/or recorded in the course of the telemedicine visit  and may receive copies of available information for a reasonable fee.  I understand that some of the potential risks of receiving the Services via telemedicine include:  Delay or interruption in medical evaluation due to technological equipment failure or disruption; Information transmitted may not be sufficient (e.g. poor resolution of images) to allow for appropriate medical decision making by the Practitioner; and/or  In rare instances, security protocols could fail, causing a breach of personal health information.  Furthermore, I acknowledge that it is my responsibility to provide information about my medical history, conditions and care that is complete and accurate to the best of my ability. I acknowledge that Practitioner's advice, recommendations, and/or decision may be based on factors not within their control, such as incomplete or inaccurate data provided by me or distortions of diagnostic images or specimens that may result from electronic transmissions. I understand that the practice of medicine is not an exact science and that Practitioner makes no warranties or guarantees regarding treatment outcomes. I acknowledge that a copy of this consent can be made available to me via my patient portal [facility] MyChart), or I can request a printed copy by calling the office of [facility].    I understand that my insurance will be billed for this visit.   I have read or had this consent read to me. I understand the contents of this consent, which adequately explains the benefits and risks of the Services being provided via telemedicine.  I have been provided ample opportunity to ask questions regarding this consent and the Services  and have had my questions answered to my satisfaction. I give my informed consent for the services to be provided through the use of telemedicine in my medical care

## 2023-07-29 NOTE — Telephone Encounter (Signed)
   Pre-operative Risk Assessment    Patient Name: Anne Roach  DOB: Jan 31, 1933 MRN: [id number]       Request for Surgical Clearance    Procedure:   Colonoscopy  Date of Surgery:  Clearance 08/03/23                                 Surgeon:  Dr. Kerin Salen Surgeon's Group or Practice Name:  [facility] Phone number:  [phone] Fax number:  [phone]   Type of Clearance Requested:   - Medical  - Pharmacy:  Hold Apixaban (Eliquis) Not Indicated   Type of Anesthesia:   Propofol   Additional requests/questions:    Signed, Emmit Pomfret   07/29/2023, 7:26 AM

## 2023-07-29 NOTE — Progress Notes (Signed)
Virtual Visit via Telephone Note   Because of Anne Roach's co-morbid illnesses, she is at least at moderate risk for complications without adequate follow up.  This format is felt to be most appropriate for this patient at this time.  The patient did not have access to video technology/had technical difficulties with video requiring transitioning to audio format only (telephone).  All issues noted in this document were discussed and addressed.  No physical exam could be performed with this format.  Please refer to the patient's chart for her consent to telehealth for [facility].  Evaluation Performed:  Preoperative cardiovascular risk assessment _____________   Date:  07/29/2023   Patient ID:  Anne Roach, DOB February 26, 1933, MRN [id number] Patient Location:  Home Provider location:   Office  Primary Care Provider:  Leilani Able, MD Primary Cardiologist:  [name], MD  Chief Complaint / Patient Profile   89 y.o. y/o female with a h/o palpitations, chronic diastolic CHF who is pending colonoscopy and presents today for telephonic preoperative cardiovascular risk assessment.  History of Present Illness    Anne Roach is a 89 y.o. female who presents via audio/video conferencing for a telehealth visit today.  Pt was last seen in cardiology clinic on 10/29/2022 by Dr. Eden Emms.  At that time Anne Roach was doing well .  The patient is now pending procedure as outlined above. Since her last visit, she remains stable from a cardiac standpoint.  Today she denies chest pain, shortness of breath, lower extremity edema, fatigue, palpitations, melena, hematuria, hemoptysis, diaphoresis, weakness, presyncope, syncope, orthopnea, and PND.   Past Medical History    Past Medical History:  Diagnosis Date   Acute on chronic diastolic CHF (congestive heart failure) (HCC) 03/27/2022   Atrial fibrillation (HCC)    Atrial fibrillation with RVR (HCC) 03/20/2017    Chronic diastolic CHF (congestive heart failure) (HCC) 10/31/2020   DM (diabetes mellitus) (HCC)    HTN (hypertension)    PNA (pneumonia) 10/31/2020   Sepsis secondary to UTI (HCC) 07/11/2020   Tremor    Past Surgical History:  Procedure Laterality Date   APPENDECTOMY     BIOPSY  12/23/2022   Procedure: BIOPSY;  Surgeon: Kerin Salen, MD;  Location: [facility];  Service: Gastroenterology;;   CARPAL TUNNEL RELEASE     CATARACT EXTRACTION Bilateral    ESOPHAGEAL DILATION  12/23/2022   Procedure: ESOPHAGEAL DILATION;  Surgeon: Kerin Salen, MD;  Location: [facility];  Service: Gastroenterology;;   ESOPHAGOGASTRODUODENOSCOPY (EGD) WITH PROPOFOL N/A 12/23/2022   Procedure: ESOPHAGOGASTRODUODENOSCOPY (EGD) WITH PROPOFOL W/ DILATION;  Surgeon: Kerin Salen, MD;  Location: [facility];  Service: Gastroenterology;  Laterality: N/A;  with dilation   HERNIA REPAIR      Allergies  No Known Allergies  Home Medications    Prior to Admission medications   Medication Sig Start Date End Date Taking? Authorizing Provider  ACCU-CHEK AVIVA PLUS test strip  10/06/20   [provider]  acetaminophen (TYLENOL) 500 MG tablet Take 1,000 mg by mouth every 6 (six) hours as needed for mild pain or headache.    [provider]  allopurinol (ZYLOPRIM) 100 MG tablet TAKE 1 TABLET(100 MG) BY MOUTH DAILY 08/09/21   Felecia Shelling, DPM  apixaban (ELIQUIS) 5 MG TABS tablet TAKE 1 TABLET(5 MG) BY MOUTH TWICE DAILY 02/24/23   Wendall Stade, MD  Cholecalciferol (VITAMIN D) 125 MCG (5000 UT) CAPS Take 5,000 Units by mouth daily with breakfast.  [provider]  donepezil (ARICEPT) 5 MG tablet Take 5 mg by mouth at bedtime.    [provider]  empagliflozin (JARDIANCE) 10 MG TABS tablet Take 1 tablet (10 mg total) by mouth daily. 03/27/23   Wendall Stade, MD  ferrous sulfate 325 (65 FE) MG EC tablet Take 325 mg by mouth daily with breakfast.    [provider]  fluticasone  (FLONASE) 50 MCG/ACT nasal spray Place 1 spray into both nostrils daily. 07/02/23   Gwyneth Sprout, MD  folic acid (FOLVITE) 800 MCG tablet Take 800 mcg by mouth daily.    [provider]  furosemide (LASIX) 40 MG tablet TAKE 1 TABLET(40 MG) BY MOUTH DAILY 05/26/23   Wendall Stade, MD  gabapentin (NEURONTIN) 400 MG capsule Take 400 mg by mouth 3 (three) times daily. 03/25/22   [provider]  lidocaine (XYLOCAINE) 2 % solution Use as directed 10 mLs in the mouth or throat every 6 (six) hours as needed (stomach pain). Patient not taking: Reported on 07/29/2023 11/27/22   Nira Conn, MD  metoprolol succinate (TOPROL-XL) 50 MG 24 hr tablet Take 1 tablet (50 mg total) by mouth daily. Take with or immediately following a meal. 04/29/23   Wendall Stade, MD  Multiple Vitamins-Minerals (ICAPS AREDS 2 PO) Take 1 capsule by mouth 2 (two) times daily.    [provider]  Potassium Chloride ER 20 MEQ TBCR Take 20 mEq by mouth daily. 04/09/22   Clegg, Amy D, NP  traMADol (ULTRAM) 50 MG tablet Take 1 tablet (50 mg total) by mouth every 12 (twelve) hours as needed. Patient taking differently: Take 50 mg by mouth every 12 (twelve) hours as needed for moderate pain. 04/02/22   Zannie Cove, MD    Physical Exam    Vital Signs:  Anne Roach does not have vital signs available for review today.  Given telephonic nature of communication, physical exam is limited. AAOx3. NAD. Normal affect.  Speech and respirations are unlabored.  Accessory Clinical Findings    None  Assessment & Plan    1.  Preoperative Cardiovascular Risk Assessment: Colonoscopy, Dr. [facility], fax #[phone]     Primary Cardiologist: [name], MD  Chart reviewed as part of pre-operative protocol coverage. Given past medical history and time since last visit, based on ACC/AHA guidelines, Anne Roach would be at acceptable risk for the planned procedure without further  cardiovascular testing.   Patient was advised that if she develops new symptoms prior to surgery to contact our office to arrange a follow-up appointment.  He verbalized understanding.  Patient with diagnosis of PAF on Eliquis for anticoagulation.     Procedure: Colonoscopy  Date of procedure: 08/03/2023     CHA2DS2-VASc Score = 7   This indicates a 11.2% annual risk of stroke. The patient's score is based upon: CHF History: 1 HTN History: 1 Diabetes History: 1 Stroke History: 0 Vascular Disease History: 1 Age Score: 2 Gender Score: 1   CrCl 36 mL/min  Platelet count 152       Per office protocol, patient can hold Eliquis for 1-2 days prior to procedure.  I will route this recommendation to the requesting party via Epic fax function and remove from pre-op pool.      Time:   Today, I have spent 5 minutes with the patient with telehealth technology discussing medical history, symptoms, and management plan.  Prior to patient's phone evaluation I spent greater than  10 minutes reviewing their past medical history and cardiac medications.    Ronney Asters, NP  07/29/2023, 1:58 PM

## 2023-07-29 NOTE — Telephone Encounter (Signed)
   Name: Anne Roach  DOB: [birth year]  MRN: [id number]  Primary Cardiologist: [name], MD   Preoperative team, please contact this patient and set up a phone call appointment for further preoperative risk assessment. Please obtain consent and complete medication review. Thank you for your help.  I confirm that guidance regarding antiplatelet and oral anticoagulation therapy has been completed and, if necessary, noted below.  Patient with diagnosis of PAF on Eliquis for anticoagulation.     Procedure: Colonoscopy  Date of procedure: 08/03/2023     CHA2DS2-VASc Score = 7   This indicates a 11.2% annual risk of stroke. The patient's score is based upon: CHF History: 1 HTN History: 1 Diabetes History: 1 Stroke History: 0 Vascular Disease History: 1 Age Score: 2 Gender Score: 1   CrCl 36 mL/min  Platelet count 152       Per office protocol, patient can hold Eliquis for 1-2 days prior to procedure.     Ronney Asters, NP 07/29/2023, 1:21 PM [facility]

## 2023-07-31 ENCOUNTER — Encounter (HOSPITAL_COMMUNITY): Payer: Self-pay | Admitting: Gastroenterology

## 2023-07-31 NOTE — H&P (Signed)
HPI: 89/female with cecal mass concerning for colon cancer Ct 07/02/23 : 4 cm cecal mass with concerns for nodal metastases.  Current Medications Taking traMADol HCl 50 MG Tablet 1 tablet as needed Orally Once a day Donepezil HCl 5 MG Tablet 1 tablet at bedtime Orally Once a day Potassium 99 MG Tablet 1 tablet Orally Once a day Allopurinol 100 MG Tablet 1 tablet Orally Once a day Eliquis(Apixaban) 5 MG Tablet 1 tablet Orally Twice a day Folic Acid 800 MCG Tablet 1 tablet Orally Once a day Furosemide 40 MG Tablet 1 tablet Orally Once a day Gabapentin 400 MG Capsule 1 capsule Orally Once a day Jardiance(Empagliflozin) 10 MG Tablet 1 tablet Orally Once a day Metoprolol Succinate ER 50 MG Tablet Extended Release 24 Hour 1 tablet Orally Once a day OTC . . , Notes to Pharmacist: AREDS 2 Gaviscon(Alum Hydroxide-Mag Carbonate) 95-358 MG/15ML Suspension 30 mL after meals Orally three times a day Pantoprazole Sodium 40 MG Tablet Delayed Release 1 tablet Orally Once a day; Take 30 minutes prior to a meal. Tylenol 8 Hour Arthritis Pain(Acetaminophen ER) 650 MG Tablet Extended Release 2 tablets as needed Orally every 8 hrs Not-Taking Farxiga(Dapagliflozin Propanediol) 10 MG Tablet 1 tablet Orally Once a day Zinc 50 MG Tablet 1 tablet Orally Once a day OTC , Notes to Pharmacist: Ginko Biloba metFORMIN HCl ER 750 MG Tablet Extended Release 24 Hour 1 tablet with evening meal Orally Once a day Medication List reviewed and reconciled with the patient  Past Medical History      type II diabetes.      Atrial fibrillation.      Gout.      Neuropathy.      Hypertension.      Arthritis.      Cataracts.      Macular degeneration.      Esophageal dysmotility.      CHF. Surgical History       appendectomy       colonoscopy       [place]       carpal tunnel Family History Father: deceased, Hypertension, diagnosed with Hypertension Mother: deceased Daughter(s): Breast cancer,  diagnosed with Breast cancer No Family History of Colon Cancer, Polyps, or Liver Disease. Social History General:  Tobacco use      cigarettes:  Never smoked     Tobacco history last updated  12/17/2022 Alcohol: no. Recreational drug use: no. Allergies N.K.D.A. Hospitalization/Major Diagnostic Procedure Hospitalization due to A-Fib 11/2021 chest pain 01/24 Review of Systems GI PROCEDURE:         Pacemaker/ AICD no.  Artificial heart valves no.  MI/heart attack no.  Abnormal heart rhythm YES, Atrial Fibrillation.  Angina no.  CVA no.  Hypertension YES         .  Hypotension no.  Asthma, COPD no.  Sleep apnea YES         .  Seizure disorders no.  Artificial joints no.  Diabetes YES, type II         .  Significant headaches no.  Vertigo YES         .  Depression/anxiety no.  Abnormal bleeding no, Taking blood thinners, ELIQUIS.  Kidney Disease no.  Liver disease no.  Blood transfusion no.  Vital Signs Wt: 169.0, Wt change: -3.6 lbs, Ht: 65, BMI: 28.12, Temp: 97.9, Pulse sitting: 76, BP sitting: 97/62. Examination         GENERAL APPEARANCE: Well developed, well nourished, no active  distress, pleasant.         SCLERA: anicteric.         CARDIOVASCULAR Normal RRR .         RESPIRATORY Breath sounds normal. Respiration even and unlabored.         ABDOMEN No masses palpated. Liver and spleen not palpated, normal. Bowel sounds normal, Abdomen not distended.         EXTREMITIES: No edema.         NEURO: alert, oriented to time, place and person, currently on a wheelchair(cannot walk a long distance).         PSYCH: mood/affect normal.  Assessment/Plan: Cecal mass on CT Plan diagnostic Colonoscopy with biopsies with MRI check. Cleared by cardiology to hold eliquis for 1-2 days prior to procedure.

## 2023-08-02 NOTE — Anesthesia Preprocedure Evaluation (Signed)
Anesthesia Evaluation  Patient identified by MRN, date of birth, ID band Patient awake    Reviewed: Allergy & Precautions, H&P , NPO status , Patient's Chart, lab work & pertinent test results  Airway Mallampati: II  TM Distance: >3 FB Neck ROM: Full    Dental  (+) Dental Advisory Given, Edentulous Upper   Pulmonary sleep apnea , pneumonia   Pulmonary exam normal breath sounds clear to auscultation       Cardiovascular hypertension, Pt. on medications pulmonary hypertension+CHF  + Valvular Problems/Murmurs  Rhythm:Irregular Rate:Normal + Systolic murmurs Echo 03/2022  1. Left ventricular ejection fraction, by estimation, is 50 to 55%. The left ventricle has low normal function. The left ventricle demonstrates global hypokinesis. There is mild left ventricular hypertrophy. Left ventricular diastolic function could not be evaluated. There is incoordinate septal motion.   2. Right ventricular systolic function is normal. The right ventricular size is normal. There is mildly elevated pulmonary artery systolic pressure. The estimated right ventricular systolic pressure is 40.9 mmHg.   3. Left atrial size was severely dilated.   4. Right atrial size was moderately dilated.   5. A small pericardial effusion is present. The pericardial effusion is posterior to the left ventricle.   6. The mitral valve is abnormal. Mild mitral valve regurgitation.   7. The aortic valve is tricuspid. Aortic valve regurgitation is not visualized. Aortic valve sclerosis is present, with no evidence of aortic valve stenosis.   8. The inferior vena cava is normal in size with greater than 50% respiratory variability, suggesting right atrial pressure of 3 mmHg.   Comparison(s): No significant change from prior study. 11/01/2020: LVEF 50-55%.     Neuro/Psych negative neurological ROS  negative psych ROS   GI/Hepatic Neg liver ROS,GERD  ,,  Endo/Other  diabetes,  Type 2    Renal/GU Renal InsufficiencyRenal disease     Musculoskeletal negative musculoskeletal ROS (+)    Abdominal   Peds  Hematology negative hematology ROS (+)   Anesthesia Other Findings   Reproductive/Obstetrics negative OB ROS                             Anesthesia Physical Anesthesia Plan  ASA: 3  Anesthesia Plan: MAC   Post-op Pain Management: Minimal or no pain anticipated   Induction: Intravenous  PONV Risk Score and Plan: 2 and Ondansetron, Treatment may vary due to age or medical condition, Propofol infusion and TIVA  Airway Management Planned: Nasal Cannula  Additional Equipment:   Intra-op Plan:   Post-operative Plan:   Informed Consent: I have reviewed the patients History and Physical, chart, labs and discussed the procedure including the risks, benefits and alternatives for the proposed anesthesia with the patient or authorized representative who has indicated his/her understanding and acceptance.     Dental advisory given  Plan Discussed with: CRNA  Anesthesia Plan Comments:        Anesthesia Quick Evaluation

## 2023-08-03 ENCOUNTER — Ambulatory Visit (HOSPITAL_COMMUNITY): Payer: Self-pay | Admitting: Anesthesiology

## 2023-08-03 ENCOUNTER — Ambulatory Visit (HOSPITAL_BASED_OUTPATIENT_CLINIC_OR_DEPARTMENT_OTHER): Payer: Medicare HMO | Admitting: Anesthesiology

## 2023-08-03 ENCOUNTER — Encounter (HOSPITAL_COMMUNITY): Payer: Self-pay | Admitting: Gastroenterology

## 2023-08-03 ENCOUNTER — Other Ambulatory Visit: Payer: Self-pay

## 2023-08-03 ENCOUNTER — Ambulatory Visit (HOSPITAL_COMMUNITY)
Admission: RE | Admit: 2023-08-03 | Discharge: 2023-08-03 | Disposition: A | Discharge status: Home / Self Care | Payer: Medicare HMO | Source: Ambulatory Visit | Attending: Gastroenterology | Admitting: Gastroenterology

## 2023-08-03 ENCOUNTER — Encounter (HOSPITAL_COMMUNITY)
Admission: RE | Disposition: A | Discharge status: Home / Self Care | Payer: Self-pay | Source: Ambulatory Visit | Attending: Gastroenterology

## 2023-08-03 DIAGNOSIS — K5669 Other partial intestinal obstruction: Secondary | ICD-10-CM | POA: Insufficient documentation

## 2023-08-03 DIAGNOSIS — G4733 Obstructive sleep apnea (adult) (pediatric): Secondary | ICD-10-CM | POA: Diagnosis not present

## 2023-08-03 DIAGNOSIS — I110 Hypertensive heart disease with heart failure: Secondary | ICD-10-CM | POA: Insufficient documentation

## 2023-08-03 DIAGNOSIS — D123 Benign neoplasm of transverse colon: Secondary | ICD-10-CM | POA: Insufficient documentation

## 2023-08-03 DIAGNOSIS — E1140 Type 2 diabetes mellitus with diabetic neuropathy, unspecified: Secondary | ICD-10-CM | POA: Diagnosis not present

## 2023-08-03 DIAGNOSIS — K644 Residual hemorrhoidal skin tags: Secondary | ICD-10-CM | POA: Diagnosis not present

## 2023-08-03 DIAGNOSIS — C180 Malignant neoplasm of cecum: Secondary | ICD-10-CM | POA: Diagnosis not present

## 2023-08-03 DIAGNOSIS — D175 Benign lipomatous neoplasm of intra-abdominal organs: Secondary | ICD-10-CM | POA: Insufficient documentation

## 2023-08-03 DIAGNOSIS — K648 Other hemorrhoids: Secondary | ICD-10-CM | POA: Diagnosis not present

## 2023-08-03 DIAGNOSIS — Z7901 Long term (current) use of anticoagulants: Secondary | ICD-10-CM | POA: Diagnosis not present

## 2023-08-03 DIAGNOSIS — D122 Benign neoplasm of ascending colon: Secondary | ICD-10-CM | POA: Insufficient documentation

## 2023-08-03 DIAGNOSIS — I272 Pulmonary hypertension, unspecified: Secondary | ICD-10-CM | POA: Diagnosis not present

## 2023-08-03 DIAGNOSIS — D125 Benign neoplasm of sigmoid colon: Secondary | ICD-10-CM | POA: Diagnosis not present

## 2023-08-03 DIAGNOSIS — R933 Abnormal findings on diagnostic imaging of other parts of digestive tract: Secondary | ICD-10-CM | POA: Diagnosis present

## 2023-08-03 DIAGNOSIS — I509 Heart failure, unspecified: Secondary | ICD-10-CM | POA: Insufficient documentation

## 2023-08-03 DIAGNOSIS — K219 Gastro-esophageal reflux disease without esophagitis: Secondary | ICD-10-CM | POA: Diagnosis not present

## 2023-08-03 HISTORY — PX: POLYPECTOMY: SHX5525

## 2023-08-03 HISTORY — PX: COLONOSCOPY WITH PROPOFOL: SHX5780

## 2023-08-03 HISTORY — PX: BIOPSY: SHX5522

## 2023-08-03 LAB — GLUCOSE, CAPILLARY: Glucose-Capillary: 97 mg/dL (ref 70–99)

## 2023-08-03 SURGERY — COLONOSCOPY WITH PROPOFOL
Anesthesia: Monitor Anesthesia Care

## 2023-08-03 MED ORDER — PROPOFOL 500 MG/50ML IV EMUL
INTRAVENOUS | Status: AC
  Filled 2023-08-03: qty 50

## 2023-08-03 MED ORDER — LIDOCAINE HCL (CARDIAC) PF 100 MG/5ML IV SOSY
PREFILLED_SYRINGE | INTRAVENOUS | Status: DC | PRN
Start: 2023-08-03 — End: 2023-08-03
  Administered 2023-08-03: 50 mg via INTRAVENOUS

## 2023-08-03 MED ORDER — PROPOFOL 500 MG/50ML IV EMUL
INTRAVENOUS | Status: DC | PRN
Start: 2023-08-03 — End: 2023-08-03
  Administered 2023-08-03: 50 ug/kg/min via INTRAVENOUS

## 2023-08-03 MED ORDER — PROPOFOL 10 MG/ML IV BOLUS
INTRAVENOUS | Status: DC | PRN
  Administered 2023-08-03: 30 mg via INTRAVENOUS
  Administered 2023-08-03: 40 mg via INTRAVENOUS

## 2023-08-03 MED ORDER — PROPOFOL 10 MG/ML IV BOLUS
INTRAVENOUS | Status: AC
  Filled 2023-08-03: qty 20

## 2023-08-03 MED ORDER — SODIUM CHLORIDE 0.9 % IV SOLN: INTRAVENOUS | Status: DC

## 2023-08-03 SURGICAL SUPPLY — 22 items

## 2023-08-03 NOTE — Interval H&P Note (Signed)
History and Physical Interval Note: 89/female with cecal mass for a colonoscopy with propofol.  08/03/2023 7:13 AM  Anne Roach  has presented today for colonoscopy with propofol, with the diagnosis of mass in colon.  The various methods of treatment have been discussed with the patient and family. After consideration of risks, benefits and other options for treatment, the patient has consented to  Procedure(s): COLONOSCOPY WITH PROPOFOL (N/A) as a surgical intervention.  The patient's history has been reviewed, patient examined, no change in status, stable for surgery.  I have reviewed the patient's chart and labs.  Questions were answered to the patient's satisfaction.     Kerin Salen

## 2023-08-03 NOTE — Transfer of Care (Signed)
Immediate Anesthesia Transfer of Care Note  Patient: Anne Roach  Procedure(s) Performed: COLONOSCOPY WITH PROPOFOL POLYPECTOMY BIOPSY  Patient Location: PACU and Endoscopy Unit  Anesthesia Type:General  Level of Consciousness: drowsy and patient cooperative  Airway & Oxygen Therapy: Patient Spontanous Breathing and Patient connected to face mask oxygen  Post-op Assessment: Report given to RN and Patient moving all extremities X 4  Post vital signs: Reviewed and stable  Last Vitals:  Vitals Value Taken Time  BP 144/86 08/03/23 0802  Temp    Pulse 69 08/03/23 0803  Resp 20 08/03/23 0803  SpO2 98 % 08/03/23 0803  Vitals shown include unfiled device data.  Last Pain:  Vitals:   08/03/23 0649  PainSc: 0-No pain         Complications: No notable events documented.

## 2023-08-03 NOTE — Discharge Instructions (Signed)
YOU HAD AN ENDOSCOPIC PROCEDURE TODAY: Refer to the procedure report and other information in the discharge instructions given to you for any specific questions about what was found during the examination. If this information does not answer your questions, please call [facility] office at [phone] to clarify.  ? ?YOU SHOULD EXPECT: Some feelings of bloating in the abdomen. Passage of more gas than usual. Walking can help get rid of the air that was put into your GI tract during the procedure and reduce the bloating. If you had a lower endoscopy (such as a colonoscopy or flexible sigmoidoscopy) you may notice spotting of blood in your stool or on the toilet paper. Some abdominal soreness may be present for a day or two, also. ? ?DIET: Your first meal following the procedure should be a light meal and then it is ok to progress to your normal diet. A half-sandwich or bowl of soup is an example of a good first meal. Heavy or fried foods are harder to digest and may make you feel nauseous or bloated. Drink plenty of fluids but you should avoid alcoholic beverages for 24 hours.   ? ?ACTIVITY: Your care partner should take you home directly after the procedure. You should plan to take it easy, moving slowly for the rest of the day. You can resume normal activity the day after the procedure however YOU SHOULD NOT DRIVE, use power tools, machinery or perform tasks that involve climbing or major physical exertion for 24 hours (because of the sedation medicines used during the test).  ? ?SYMPTOMS TO REPORT IMMEDIATELY: ?A gastroenterologist can be reached at any hour. Please call [phone]  for any of the following symptoms:  ?Following lower endoscopy (colonoscopy, flexible sigmoidoscopy) ?Excessive amounts of blood in the stool  ?Significant tenderness, worsening of abdominal pains  ?Swelling of the abdomen that is new, acute  ?Fever of 100? or higher  ? ? ?FOLLOW UP:  ?If any biopsies were taken you will be contacted  by phone or by letter within the next 1-3 weeks. Call [phone]  if you have not heard about the biopsies in 3 weeks.  ?Please also call with any specific questions about appointments or follow up tests. YOU HAD AN ENDOSCOPIC PROCEDURE TODAY: Refer to the procedure report and other information in the discharge instructions given to you for any specific questions about what was found during the examination. If this information does not answer your questions, please call [facility] office at [phone] to clarify.   ?

## 2023-08-03 NOTE — Anesthesia Postprocedure Evaluation (Signed)
Anesthesia Post Note  Patient: Anne Roach  Procedure(s) Performed: COLONOSCOPY WITH PROPOFOL POLYPECTOMY BIOPSY     Patient location during evaluation: PACU Anesthesia Type: MAC Level of consciousness: awake and alert Pain management: pain level controlled Vital Signs Assessment: post-procedure vital signs reviewed and stable Respiratory status: spontaneous breathing Cardiovascular status: stable Anesthetic complications: no   No notable events documented.  Last Vitals:  Vitals:   08/03/23 0820 08/03/23 0830  BP: (!) 162/94 (!) 153/77  Pulse: (!) 33 (!) 114  Resp: (!) 22 18  Temp:    SpO2: 97% 96%    Last Pain:  Vitals:   08/03/23 0830  TempSrc:   PainSc: 0-No pain                 Lewie Loron

## 2023-08-03 NOTE — Op Note (Signed)
[facility] Patient Name: Anne Roach Procedure Date: 08/03/2023 MRN: [id number] Attending MD: Kerin Salen , MD, [id number] Date of Birth: 02/08/1933 CSN: [id number] Age: 89 Admit Type: Outpatient Procedure:                Colonoscopy Indications:              Abnormal CT of the GI tract, cecal mass suspicious                            for malignancy Providers:                Kerin Salen, MD, Kandice Robinsons, Technician, Marge Duncans, RN Referring MD:             Orland Penman Medicines:                Monitored Anesthesia Care Complications:            No immediate complications. Estimated blood loss:                            Minimal. Estimated Blood Loss:     Estimated blood loss was minimal. Procedure:                Pre-Anesthesia Assessment:                           - Prior to the procedure, a History and Physical                            was performed, and patient medications and                            allergies were reviewed. The patient's tolerance of                            previous anesthesia was also reviewed. The risks                            and benefits of the procedure and the sedation                            options and risks were discussed with the patient.                            All questions were answered, and informed consent                            was obtained. Prior Anticoagulants: The patient has                            taken Eliquis (apixaban), last dose was 2 days                            prior  to procedure. ASA Grade Assessment: III - A                            patient with severe systemic disease. After                            reviewing the risks and benefits, the patient was                            deemed in satisfactory condition to undergo the                            procedure.                           After obtaining informed consent, the colonoscope                             was passed under direct vision. Throughout the                            procedure, the patient's blood pressure, pulse, and                            oxygen saturations were monitored continuously. The                            PCF-HQ190L ([id number]) Olympus colonoscope was                            introduced through the anus and advanced to the the                            cecum, identified by the ileocecal valve. The                            colonoscopy was performed without difficulty. The                            patient tolerated the procedure well. The quality                            of the bowel preparation was fair. The ileocecal                            valve and the rectum were photographed. Scope In: 7:33:06 AM Scope Out: 7:56:20 AM Scope Withdrawal Time: 0 hours 13 minutes 44 seconds  Total Procedure Duration: 0 hours 23 minutes 14 seconds  Findings:      Hemorrhoids were found on perianal exam.      Seven sessile polyps were found in the sigmoid colon (1), in the       transverse colon (5) and in the ascending colon (1). The polyps were 5       to 8 mm in size. These polyps were removed  with a cold snare. Resection       and retrieval were complete.      A frond-like/villous, fungating, infiltrative, sessile and ulcerated       partially obstructing large mass was found in the cecum. This was       biopsied with a cold forceps for histology.      There was a medium-sized lipoma, in the sigmoid colon.      Non-bleeding internal hemorrhoids were found during retroflexion. Impression:               - Preparation of the colon was fair.                           - Hemorrhoids found on perianal exam.                           - Seven 5 to 8 mm polyps in the sigmoid colon (1),                            in the transverse colon (5) and in the ascending                            colon (1), removed with a cold snare. Resected and                             retrieved.                           - Malignant partially obstructing tumor in the                            cecum. Biopsied. MMR check requested.                           - Medium-sized lipoma in the sigmoid colon.                           - Non-bleeding internal hemorrhoids. Moderate Sedation:      Patient did not receive moderate sedation for this procedure, but       instead received monitored anesthesia care. Recommendation:           - Patient has a contact number available for                            emergencies. The signs and symptoms of potential                            delayed complications were discussed with the                            patient. Return to normal activities tomorrow.                            Written discharge instructions were provided to the  patient.                           - Resume regular diet.                           - Continue present medications.                           - Await pathology results. MMR check requested.                           - Resume Eliquis (apixaban) at prior dose in 2 days. Procedure Code(s):        --- Professional ---                           [phone], Colonoscopy, flexible; with removal of                            tumor(s), polyp(s), or other lesion(s) by snare                            technique                           45380, 59, Colonoscopy, flexible; with biopsy,                            single or multiple Diagnosis Code(s):        --- Professional ---                           K64.8, Other hemorrhoids                           D12.5, Benign neoplasm of sigmoid colon                           D12.3, Benign neoplasm of transverse colon (hepatic                            flexure or splenic flexure)                           D12.2, Benign neoplasm of ascending colon                           C18.0, Malignant neoplasm of cecum                           K56.690, Other partial  intestinal obstruction                           D17.5, Benign lipomatous neoplasm of                            intra-abdominal organs  R93.3, Abnormal findings on diagnostic imaging of                            other parts of digestive tract CPT copyright 2022 American Medical Association. All rights reserved. The codes documented in this report are preliminary and upon coder review may  be revised to meet current compliance requirements. Kerin Salen, MD 08/03/2023 8:08:18 AM This report has been signed electronically. Number of Addenda: 0

## 2023-08-03 NOTE — Anesthesia Procedure Notes (Signed)
Procedure Name: General with mask airway Date/Time: 08/03/2023 7:34 AM  Performed by: Lily Lovings, CRNAPre-anesthesia Checklist: Patient identified, Emergency Drugs available, Suction available, Patient being monitored and Timeout performed Patient Re-evaluated:Patient Re-evaluated prior to induction Oxygen Delivery Method: Simple face mask Preoxygenation: Pre-oxygenation with 100% oxygen Induction Type: IV induction Comments: POM

## 2023-08-04 ENCOUNTER — Inpatient Hospital Stay: Payer: Medicare HMO

## 2023-08-04 ENCOUNTER — Encounter (HOSPITAL_COMMUNITY): Payer: Self-pay | Admitting: Gastroenterology

## 2023-08-04 DIAGNOSIS — C189 Malignant neoplasm of colon, unspecified: Secondary | ICD-10-CM

## 2023-08-04 NOTE — Progress Notes (Signed)
I spoke with Ms Galo daughter, Mackie Pai.  We are going to keep Ms Corson's appt schedule as is.  All questions were answered.  She verbalized understanding.

## 2023-08-06 ENCOUNTER — Ambulatory Visit: Payer: Medicare HMO | Admitting: Hematology

## 2023-08-06 ENCOUNTER — Inpatient Hospital Stay: Payer: Medicare HMO | Attending: Hematology

## 2023-08-06 DIAGNOSIS — I4891 Unspecified atrial fibrillation: Secondary | ICD-10-CM | POA: Insufficient documentation

## 2023-08-06 DIAGNOSIS — C182 Malignant neoplasm of ascending colon: Secondary | ICD-10-CM | POA: Diagnosis present

## 2023-08-06 DIAGNOSIS — Z803 Family history of malignant neoplasm of breast: Secondary | ICD-10-CM | POA: Diagnosis not present

## 2023-08-06 DIAGNOSIS — D500 Iron deficiency anemia secondary to blood loss (chronic): Secondary | ICD-10-CM

## 2023-08-06 DIAGNOSIS — R1310 Dysphagia, unspecified: Secondary | ICD-10-CM | POA: Diagnosis not present

## 2023-08-06 DIAGNOSIS — R590 Localized enlarged lymph nodes: Secondary | ICD-10-CM | POA: Diagnosis not present

## 2023-08-06 DIAGNOSIS — I5033 Acute on chronic diastolic (congestive) heart failure: Secondary | ICD-10-CM | POA: Diagnosis not present

## 2023-08-06 DIAGNOSIS — K6389 Other specified diseases of intestine: Secondary | ICD-10-CM

## 2023-08-06 DIAGNOSIS — C189 Malignant neoplasm of colon, unspecified: Secondary | ICD-10-CM

## 2023-08-06 LAB — CBC WITH DIFFERENTIAL (CANCER CENTER ONLY)
Abs Immature Granulocytes: 0.04 10*3/uL (ref 0.00–0.07)
Basophils Absolute: 0.1 10*3/uL (ref 0.0–0.1)
Basophils Relative: 1 %
Eosinophils Absolute: 0.4 10*3/uL (ref 0.0–0.5)
Eosinophils Relative: 5 %
HCT: 33.2 % — ABNORMAL LOW (ref 36.0–46.0)
Hemoglobin: 11.1 g/dL — ABNORMAL LOW (ref 12.0–15.0)
Immature Granulocytes: 1 %
Lymphocytes Relative: 37 %
Lymphs Abs: 2.9 10*3/uL (ref 0.7–4.0)
MCH: 32.5 pg (ref 26.0–34.0)
MCHC: 33.4 g/dL (ref 30.0–36.0)
MCV: 97.1 fL (ref 80.0–100.0)
Monocytes Absolute: 0.7 10*3/uL (ref 0.1–1.0)
Monocytes Relative: 9 %
Neutro Abs: 3.8 10*3/uL (ref 1.7–7.7)
Neutrophils Relative %: 47 %
Platelet Count: 204 10*3/uL (ref 150–400)
RBC: 3.42 MIL/uL — ABNORMAL LOW (ref 3.87–5.11)
RDW: 14.5 % (ref 11.5–15.5)
WBC Count: 7.9 10*3/uL (ref 4.0–10.5)
nRBC: 0 % (ref 0.0–0.2)

## 2023-08-06 LAB — CMP (CANCER CENTER ONLY)
ALT: 9 U/L (ref 0–44)
AST: 16 U/L (ref 15–41)
Albumin: 3.4 g/dL — ABNORMAL LOW (ref 3.5–5.0)
Alkaline Phosphatase: 49 U/L (ref 38–126)
Anion gap: 7 (ref 5–15)
BUN: 14 mg/dL (ref 8–23)
CO2: 30 mmol/L (ref 22–32)
Calcium: 8.8 mg/dL — ABNORMAL LOW (ref 8.9–10.3)
Chloride: 105 mmol/L (ref 98–111)
Creatinine: 1.08 mg/dL — ABNORMAL HIGH (ref 0.44–1.00)
GFR, Estimated: 49 mL/min — ABNORMAL LOW (ref >60)
Glucose, Bld: 80 mg/dL (ref 70–99)
Potassium: 4.3 mmol/L (ref 3.5–5.1)
Sodium: 142 mmol/L (ref 135–145)
Total Bilirubin: 0.4 mg/dL (ref 0.3–1.2)
Total Protein: 6.8 g/dL (ref 6.5–8.1)

## 2023-08-06 LAB — CEA (ACCESS): CEA (CHCC): 3.33 ng/mL (ref 0.00–5.00)

## 2023-08-06 LAB — FERRITIN: Ferritin: 68 ng/mL (ref 11–307)

## 2023-08-06 LAB — SURGICAL PATHOLOGY

## 2023-08-11 DIAGNOSIS — C182 Malignant neoplasm of ascending colon: Secondary | ICD-10-CM | POA: Insufficient documentation

## 2023-08-12 ENCOUNTER — Inpatient Hospital Stay (HOSPITAL_BASED_OUTPATIENT_CLINIC_OR_DEPARTMENT_OTHER): Payer: Medicare HMO | Admitting: Hematology

## 2023-08-12 VITALS — BP 99/58 | HR 68 | Temp 98.4°F | Resp 14 | Ht 64.0 in | Wt 153.5 lb

## 2023-08-12 DIAGNOSIS — C182 Malignant neoplasm of ascending colon: Secondary | ICD-10-CM

## 2023-08-12 NOTE — Assessment & Plan Note (Addendum)
-  Diagnosed on 08/03/2023 -presented with nausea and vomiting since February 2024.  CT scan on July 02, 2023 showed 4 cm mass in the cecum, with regional adenopathy, concerning for malignancy.  No evidence of distant metastasis. -She underwent colonoscopy on August 03, 2023 which showed a large mass in the cecum, biopsy confirmed adenocarcinoma.  She had a multiple polyps in as a part of colon and a biopsy also showed fragments of adenocarcinoma, probable contamination from the cecal mass biopsy. -I will refer her to colorectal surgeon for surgery.

## 2023-08-12 NOTE — Progress Notes (Signed)
Referral, demographics, insurance info, pathology report, and last ov note faxed to [facility].

## 2023-08-12 NOTE — Progress Notes (Signed)
[facility]   [phone] [phone]   Clinic Follow up Note   Patient Care Team: Leilani Able, MD as PCP - General (Family Medicine) Wendall Stade, MD as PCP - Cardiology (Cardiology)  Date of Service:  08/12/2023  CHIEF COMPLAINT: f/u of colon cancer   CURRENT THERAPY:  Pending   Oncology History   Cancer of right colon [facility]) -Diagnosed on 08/03/2023 -presented with nausea and vomiting since February 2024.  CT scan on July 02, 2023 showed 4 cm mass in the cecum, with regional adenopathy, concerning for malignancy.  No evidence of distant metastasis. -She underwent colonoscopy on August 03, 2023 which showed a large mass in the cecum, biopsy confirmed adenocarcinoma.  She had a multiple polyps in as a part of colon and a biopsy also showed fragments of adenocarcinoma, probable contamination from the cecal mass biopsy. -I will refer her to colorectal surgeon for surgery.   Assessment and Plan    Colon Cancer Newly diagnosed colon cancer in the cecum with multiple polyps, some of which also showed invasive cancer. CT scan showed no definitive evidence of metastasis but suspicious lymph nodes in the mediastinum, up to 1.8cm but they have been there since May 2023  -Refer to colorectal surgeon for surgical management. -Order PET scan to further evaluate suspicious lymph nodes in the mediastinum, to see if she needs biopsy   Mediastinal Lymphadenopathy Stable lymphadenopathy noted on CT scan from May 2023 and August 2024. Unclear if related to colon cancer. -Review PET scan results once available to determine if biopsy is needed.  Dysphagia Patient reports difficulty swallowing and mucus production after eating. No weight loss noted. -Continue small, frequent meals. -Consider further evaluation if symptoms persist or worsen.  Follow-up Plan for phone visit 1-2 weeks after PET scan to discuss results and next steps.       Discussed the  use of AI scribe software for clinical note transcription with the patient, who gave verbal consent to proceed.  History of Present Illness   The patient, an 89 year old individual with a recent diagnosis of colon cancer, presents for a discussion of the results of her recent colonoscopy and the next steps in her treatment. The patient reports that they were informed of the diagnosis by Dr. Lendell Caprice, who performed the colonoscopy and removed multiple polyps. The patient was unaware that cancer was found in the polyps. The patient expresses concern about the possibility of needing a colostomy bag after surgery, which the doctor reassures is unlikely due to the location of the cancer.  The patient also reports a history of dysmotility and mucus production when eating or drinking, which has led to a decrease in food intake and subsequent weight loss. The patient denies difficulty swallowing but reports that food "gets stuck" if she eats too much. The patient has been managing this by eating smaller amounts more frequently.  The patient also has a history of congestive heart failure, for which they were hospitalized in May of 2013. The patient was unaware of the presence of enlarged lymph nodes in the mediastinum, which were noted on a CT scan from that time.         All other systems were reviewed with the patient and are negative.  MEDICAL HISTORY:  Past Medical History:  Diagnosis Date   Acute on chronic diastolic CHF (congestive heart failure) (HCC) 03/27/2022   Atrial fibrillation (HCC)    Atrial fibrillation with RVR (HCC) 03/20/2017   Chronic diastolic  CHF (congestive heart failure) (HCC) 10/31/2020   DM (diabetes mellitus) (HCC)    HTN (hypertension)    PNA (pneumonia) 10/31/2020   Sepsis secondary to UTI (HCC) 07/11/2020   Tremor     SURGICAL HISTORY: Past Surgical History:  Procedure Laterality Date   APPENDECTOMY     BIOPSY  12/23/2022   Procedure: BIOPSY;  Surgeon: Kerin Salen,  MD;  Location: [facility];  Service: Gastroenterology;;   BIOPSY  08/03/2023   Procedure: BIOPSY;  Surgeon: Kerin Salen, MD;  Location: [facility];  Service: Gastroenterology;;   CARPAL TUNNEL RELEASE     CATARACT EXTRACTION Bilateral    COLONOSCOPY WITH PROPOFOL N/A 08/03/2023   Procedure: COLONOSCOPY WITH PROPOFOL;  Surgeon: Kerin Salen, MD;  Location: [facility];  Service: Gastroenterology;  Laterality: N/A;   ESOPHAGEAL DILATION  12/23/2022   Procedure: ESOPHAGEAL DILATION;  Surgeon: Kerin Salen, MD;  Location: [facility];  Service: Gastroenterology;;   ESOPHAGOGASTRODUODENOSCOPY (EGD) WITH PROPOFOL N/A 12/23/2022   Procedure: ESOPHAGOGASTRODUODENOSCOPY (EGD) WITH PROPOFOL W/ DILATION;  Surgeon: Kerin Salen, MD;  Location: [facility];  Service: Gastroenterology;  Laterality: N/A;  with dilation   HERNIA REPAIR     POLYPECTOMY  08/03/2023   Procedure: POLYPECTOMY;  Surgeon: Kerin Salen, MD;  Location: [facility];  Service: Gastroenterology;;    I have reviewed the social history and family history with the patient and they are unchanged from previous note.  ALLERGIES:  has No Known Allergies.  MEDICATIONS:  Current Outpatient Medications  Medication Sig Dispense Refill   ACCU-CHEK AVIVA PLUS test strip      acetaminophen (TYLENOL) 500 MG tablet Take 1,000 mg by mouth every 6 (six) hours as needed for mild pain or headache.     allopurinol (ZYLOPRIM) 100 MG tablet TAKE 1 TABLET(100 MG) BY MOUTH DAILY 90 tablet 2   apixaban (ELIQUIS) 5 MG TABS tablet TAKE 1 TABLET(5 MG) BY MOUTH TWICE DAILY 60 tablet 5   Cholecalciferol (VITAMIN D) 125 MCG (5000 UT) CAPS Take 5,000 Units by mouth daily with breakfast.     donepezil (ARICEPT) 5 MG tablet Take 5 mg by mouth at bedtime.     empagliflozin (JARDIANCE) 10 MG TABS tablet Take 1 tablet (10 mg total) by mouth daily. 90 tablet 2   ferrous sulfate 325 (65 FE) MG EC tablet Take 325 mg by mouth daily with breakfast.     fluticasone (FLONASE)  50 MCG/ACT nasal spray Place 1 spray into both nostrils daily. 16 each 0   folic acid (FOLVITE) 800 MCG tablet Take 800 mcg by mouth daily.     furosemide (LASIX) 40 MG tablet TAKE 1 TABLET(40 MG) BY MOUTH DAILY 90 tablet 1   gabapentin (NEURONTIN) 400 MG capsule Take 400 mg by mouth 3 (three) times daily.     lidocaine (XYLOCAINE) 2 % solution Use as directed 10 mLs in the mouth or throat every 6 (six) hours as needed (stomach pain). (Patient not taking: Reported on 07/29/2023) 100 mL 0   metoprolol succinate (TOPROL-XL) 50 MG 24 hr tablet Take 1 tablet (50 mg total) by mouth daily. Take with or immediately following a meal. 90 tablet 1   Multiple Vitamins-Minerals (ICAPS AREDS 2 PO) Take 1 capsule by mouth 2 (two) times daily.     Potassium Chloride ER 20 MEQ TBCR Take 20 mEq by mouth daily. 60 tablet    traMADol (ULTRAM) 50 MG tablet Take 1 tablet (50 mg total) by mouth every 12 (twelve) hours as needed. (Patient taking  differently: Take 50 mg by mouth every 12 (twelve) hours as needed for moderate pain.)     No current facility-administered medications for this visit.    PHYSICAL EXAMINATION: ECOG PERFORMANCE STATUS: 1 - Symptomatic but completely ambulatory  Vitals:   08/12/23 0945  BP: (!) 99/58  Pulse: 68  Resp: 14  Temp: 98.4 F (36.9 C)  SpO2: 93%   Wt Readings from Last 3 Encounters:  08/12/23 153 lb 8 oz (69.6 kg)  08/03/23 152 lb 1.9 oz (69 kg)  07/07/23 153 lb 14.4 oz (69.8 kg)     GENERAL:alert, no distress and comfortable SKIN: skin color, texture, turgor are normal, no rashes or significant lesions EYES: normal, Conjunctiva are pink and non-injected, sclera clear NECK: supple, thyroid normal size, non-tender, without nodularity LYMPH:  no palpable lymphadenopathy in the cervical, axillary  LUNGS: clear to auscultation and percussion with normal breathing effort HEART: regular rate & rhythm and no murmurs and no lower extremity edema ABDOMEN:abdomen soft,  non-tender and normal bowel sounds Musculoskeletal:no cyanosis of digits and no clubbing  NEURO: alert & oriented x 3 with fluent speech, no focal motor/sensory deficits   LABORATORY DATA:  I have reviewed the data as listed    Latest Ref Rng & Units 08/06/2023   12:31 PM 07/02/2023    3:37 PM 11/26/2022   11:49 PM  CBC  WBC 4.0 - 10.5 K/uL 7.9  5.9  8.3   Hemoglobin 12.0 - 15.0 g/dL 40.9  81.1  8.3   Hematocrit 36.0 - 46.0 % 33.2  32.7  29.6   Platelets 150 - 400 K/uL 204  152  220         Latest Ref Rng & Units 08/06/2023   12:31 PM 07/07/2023    3:18 PM 07/02/2023    3:37 PM  CMP  Glucose 70 - 99 mg/dL 80  87  914   BUN 8 - 23 mg/dL 14  11  9    Creatinine 0.44 - 1.00 mg/dL 7.82  9.56  2.13   Sodium 135 - 145 mmol/L 142  142  141   Potassium 3.5 - 5.1 mmol/L 4.3  4.4  2.7   Chloride 98 - 111 mmol/L 105  104  107   CO2 22 - 32 mmol/L 30  32  25   Calcium 8.9 - 10.3 mg/dL 8.8  9.0  7.5   Total Protein 6.5 - 8.1 g/dL 6.8  6.8  5.5   Total Bilirubin 0.3 - 1.2 mg/dL 0.4  0.5  0.4   Alkaline Phos 38 - 126 U/L 49  50  44   AST 15 - 41 U/L 16  14  16    ALT 0 - 44 U/L 9  6  9        RADIOGRAPHIC STUDIES: I have personally reviewed the radiological images as listed and agreed with the findings in the report. No results found.    Orders Placed This Encounter  Procedures   NM PET Image Initial (PI) Skull Base To Thigh    Standing Status:   Future    Standing Expiration Date:   08/11/2024    Order Specific Question:   If indicated for the ordered procedure, I authorize the administration of a radiopharmaceutical per Radiology protocol    Answer:   Yes    Order Specific Question:   Preferred imaging location?    Answer:   [facility]   Ambulatory referral to General Surgery    Referral Priority:  Routine    Referral Type:   Surgical    Referral Reason:   Specialty Services Required    Requested Specialty:   General Surgery    Number of Visits Requested:   1   All  questions were answered. The patient knows to call the clinic with any problems, questions or concerns. No barriers to learning was detected. The total time spent in the appointment was 30 minutes.     Malachy Mood, MD 08/12/2023

## 2023-08-24 ENCOUNTER — Other Ambulatory Visit: Payer: Self-pay | Admitting: Cardiovascular Disease

## 2023-08-24 NOTE — Telephone Encounter (Signed)
Prescription refill request for Eliquis received. Indication: AF Last office visit: 07/29/23  Sherlean Foot NP Scr: 1.08 on 08/06/23  Epic Age: 89 Weight: 69.6kg  Based on above findings Eliquis 5mg  twice daily is the appropriate dose.  Refill approved.

## 2023-08-26 ENCOUNTER — Ambulatory Visit (HOSPITAL_COMMUNITY): Payer: Medicare HMO

## 2023-08-26 ENCOUNTER — Telehealth (HOSPITAL_BASED_OUTPATIENT_CLINIC_OR_DEPARTMENT_OTHER): Payer: Self-pay | Admitting: *Deleted

## 2023-08-26 NOTE — Telephone Encounter (Signed)
Pre-operative Risk Assessment    Patient Name: AVAN ELLEFSEN  DOB: 07/05/1933 MRN: [id number]      Request for Surgical Clearance    Procedure:   Laparoscopic Right Hemicolectomy  Date of Surgery:  Clearance TBD                                 Surgeon:  Dr. Marin Olp Surgeon's Group or Practice Name:  [facility] Phone number:  [phone] Fax number:  [phone]   Type of Clearance Requested:   - Medical  - Pharmacy:  Hold Apixaban (Eliquis) Not Indicated.   Type of Anesthesia:  General    Additional requests/questions:    Signed, Emmit Pomfret   08/26/2023, 3:34 PM

## 2023-08-27 NOTE — Telephone Encounter (Signed)
Name: Anne Roach  DOB: 05/07/1933  MRN: [id number]   Primary Cardiologist: [name], MD  Chart reviewed as part of pre-operative protocol coverage. Patient was contacted 08/27/2023 in reference to pre-operative risk assessment for pending surgery as outlined below. Spoke with pt's daughter (DPR on file).  Anne Roach was last seen virtually on 07/29/2023 by Edd Fabian, NP.  Since that day, Anne Roach has done well from a cardiac standpoint. She denies any new symptoms or concerns.   Therefore, based on ACC/AHA guidelines, the patient would be at acceptable risk for the planned procedure without further cardiovascular testing.   Per office protocol, patient can hold Eliquis for 3 days prior to procedure.   Patient will not need bridging with Lovenox (enoxaparin) around procedure. Please resume Eliquis as soon as possible postprocedure, at the discretion of the surgeon.   The patient's daughter was advised that if she develops new symptoms prior to surgery to contact our office to arrange for a follow-up visit, and she verbalized understanding.  I will route this recommendation to the requesting party via Epic fax function and remove from pre-op pool. Please call with questions.  Joylene Grapes, NP 08/27/2023, 4:53 PM

## 2023-08-27 NOTE — Telephone Encounter (Signed)
Patient with diagnosis of atrial fibrillation on Eliquis for anticoagulation.    Procedure:   Laparoscopic Right Hemicolectomy   Date of Surgery:  Clearance TBD    CHA2DS2-VASc Score = 7   This indicates a 11.2% annual risk of stroke. The patient's score is based upon: CHF History: 1 HTN History: 1 Diabetes History: 1 Stroke History: 0 Vascular Disease History: 1 Age Score: 2 Gender Score: 1    CrCl 40 Platelet count 204  Per office protocol, patient can hold Eliquis for 3 days prior to procedure.   Patient will not need bridging with Lovenox (enoxaparin) around procedure.  **This guidance is not considered finalized until pre-operative APP has relayed final recommendations.**

## 2023-09-02 ENCOUNTER — Telehealth: Payer: Medicare HMO | Admitting: Hematology

## 2023-09-03 ENCOUNTER — Encounter (HOSPITAL_COMMUNITY)
Admission: RE | Admit: 2023-09-03 | Discharge: 2023-09-03 | Disposition: A | Discharge status: Home / Self Care | Payer: Medicare HMO | Source: Ambulatory Visit | Attending: Hematology | Admitting: Hematology

## 2023-09-03 DIAGNOSIS — C182 Malignant neoplasm of ascending colon: Secondary | ICD-10-CM | POA: Insufficient documentation

## 2023-09-03 LAB — GLUCOSE, CAPILLARY: Glucose-Capillary: 88 mg/dL (ref 70–99)

## 2023-09-03 MED ORDER — FLUDEOXYGLUCOSE F - 18 (FDG) INJECTION
7.6000 | Freq: Once | INTRAVENOUS | Status: AC
  Administered 2023-09-03: 7.6 via INTRAVENOUS

## 2023-09-06 NOTE — Assessment & Plan Note (Signed)
-  Diagnosed on 08/03/2023 -presented with nausea and vomiting since February 2024.  CT scan on July 02, 2023 showed 4 cm mass in the cecum, with regional adenopathy, concerning for malignancy.  No evidence of distant metastasis. -She underwent colonoscopy on August 03, 2023 which showed a large mass in the cecum, biopsy confirmed adenocarcinoma.  She had a multiple polyps in as a part of colon and a biopsy also showed fragments of adenocarcinoma, probable contamination from the cecal mass biopsy. -she was seen by colorectal surgeon Dr. Cliffton Asters and is scheduled for surgery on 10/08/2023. -PET scan reviewed

## 2023-09-07 ENCOUNTER — Inpatient Hospital Stay: Payer: Medicare HMO | Attending: Hematology | Admitting: Hematology

## 2023-09-07 ENCOUNTER — Encounter: Payer: Self-pay | Admitting: Hematology

## 2023-09-07 DIAGNOSIS — C182 Malignant neoplasm of ascending colon: Secondary | ICD-10-CM | POA: Diagnosis not present

## 2023-09-07 NOTE — Progress Notes (Signed)
[facility]   Telephone:([phone] Fax:([phone]) [phone]   Clinic Follow up Note   Patient Care Team: Leilani Able, MD as PCP - General (Family Medicine) Wendall Stade, MD as PCP - Cardiology (Cardiology)  Date of Service:  09/07/2023  I connected with Anne Roach on 09/07/23 at 10:00 AM EDT by telephone and verified that I am speaking with the correct person using two identifiers.   I discussed the limitations, risks, security and privacy concerns of performing an evaluation and management service by telephone and the availability of in person appointments. I also discussed with the patient that there may be a patient responsible charge related to this service. The patient expressed understanding and agreed to proceed.   Patient's location:  Home, I spoke with her daughter  Provider's location:  office   CHIEF COMPLAINT: f/u of PET scan findings   CURRENT THERAPY:  Pending surgery   Oncology History   Cancer of right colon [facility]) -Diagnosed on 08/03/2023 -presented with nausea and vomiting since February 2024.  CT scan on July 02, 2023 showed 4 cm mass in the cecum, with regional adenopathy, concerning for malignancy.  No evidence of distant metastasis. -She underwent colonoscopy on August 03, 2023 which showed a large mass in the cecum, biopsy confirmed adenocarcinoma.  She had a multiple polyps in as a part of colon and a biopsy also showed fragments of adenocarcinoma, probable contamination from the cecal mass biopsy. -she was seen by colorectal surgeon Dr. Cliffton Asters and is scheduled for surgery on 10/08/2023     Assessment and Plan    Colon Cancer PET scan shows primary tumor and a few positive lymph nodes in the vicinity. No high suspicion of metastatic disease.  There is mild nonspecific tracer uptake associated with subcarinal and right hilar lymph nodes, and small left pleural effusion, indeterminate, will follow-up with repeating CT scan in 3 to 6  months. - Proceed with scheduled surgery on October 08, 2023. - Follow-up on chest lymph nodes and pleural effusion in the future. -If her surgical margins negative, given her advanced age I do not plan to give her adjuvant chemotherapy even if she has high risk disease.  Post-Surgical Follow-up - Plan to see patient after surgery for follow-up.      Plan -PET scan finding reviewed  -She will proceed with colon surgery which is scheduled for October 08, 2023 -Lab and follow-up in 3 to 4 weeks after surgery    Discussed the use of AI scribe software for clinical note transcription with the patient, who gave verbal consent to proceed.  History of Present Illness   An 89 year old patient with a known diagnosis of colon cancer underwent a PET scan to assess the extent of the disease. The scan revealed positivity in the primary tumor and a few surrounding lymph nodes. However, there is no strong suspicion of metastasis to the lymph nodes in the chest. The patient also has some pleural effusion on the left side, which is likely related to an issue in the lung and pleura. The patient is scheduled for surgery in the near future. No new concerns       All other systems were reviewed with the patient and are negative.  MEDICAL HISTORY:  Past Medical History:  Diagnosis Date   Acute on chronic diastolic CHF (congestive heart failure) (HCC) 03/27/2022   Atrial fibrillation (HCC)    Atrial fibrillation with RVR (HCC) 03/20/2017   Chronic diastolic CHF (congestive heart failure) (HCC)  10/31/2020   DM (diabetes mellitus) (HCC)    HTN (hypertension)    PNA (pneumonia) 10/31/2020   Sepsis secondary to UTI (HCC) 07/11/2020   Tremor     SURGICAL HISTORY: Past Surgical History:  Procedure Laterality Date   APPENDECTOMY     BIOPSY  12/23/2022   Procedure: BIOPSY;  Surgeon: Kerin Salen, MD;  Location: [facility];  Service: Gastroenterology;;   BIOPSY  08/03/2023   Procedure: BIOPSY;   Surgeon: Kerin Salen, MD;  Location: [facility];  Service: Gastroenterology;;   CARPAL TUNNEL RELEASE     CATARACT EXTRACTION Bilateral    COLONOSCOPY WITH PROPOFOL N/A 08/03/2023   Procedure: COLONOSCOPY WITH PROPOFOL;  Surgeon: Kerin Salen, MD;  Location: [facility];  Service: Gastroenterology;  Laterality: N/A;   ESOPHAGEAL DILATION  12/23/2022   Procedure: ESOPHAGEAL DILATION;  Surgeon: Kerin Salen, MD;  Location: [facility];  Service: Gastroenterology;;   ESOPHAGOGASTRODUODENOSCOPY (EGD) WITH PROPOFOL N/A 12/23/2022   Procedure: ESOPHAGOGASTRODUODENOSCOPY (EGD) WITH PROPOFOL W/ DILATION;  Surgeon: Kerin Salen, MD;  Location: [facility];  Service: Gastroenterology;  Laterality: N/A;  with dilation   HERNIA REPAIR     POLYPECTOMY  08/03/2023   Procedure: POLYPECTOMY;  Surgeon: Kerin Salen, MD;  Location: [facility];  Service: Gastroenterology;;    I have reviewed the social history and family history with the patient and they are unchanged from previous note.  ALLERGIES:  has No Known Allergies.  MEDICATIONS:  Current Outpatient Medications  Medication Sig Dispense Refill   ACCU-CHEK AVIVA PLUS test strip      acetaminophen (TYLENOL) 500 MG tablet Take 1,000 mg by mouth every 6 (six) hours as needed for mild pain or headache.     allopurinol (ZYLOPRIM) 100 MG tablet TAKE 1 TABLET(100 MG) BY MOUTH DAILY 90 tablet 2   Cholecalciferol (VITAMIN D) 125 MCG (5000 UT) CAPS Take 5,000 Units by mouth daily with breakfast.     donepezil (ARICEPT) 5 MG tablet Take 5 mg by mouth at bedtime.     ELIQUIS 5 MG TABS tablet TAKE 1 TABLET(5 MG) BY MOUTH TWICE DAILY 60 tablet 5   empagliflozin (JARDIANCE) 10 MG TABS tablet Take 1 tablet (10 mg total) by mouth daily. 90 tablet 2   ferrous sulfate 325 (65 FE) MG EC tablet Take 325 mg by mouth daily with breakfast.     fluticasone (FLONASE) 50 MCG/ACT nasal spray Place 1 spray into both nostrils daily. 16 each 0   folic acid (FOLVITE) 800 MCG tablet  Take 800 mcg by mouth daily.     furosemide (LASIX) 40 MG tablet TAKE 1 TABLET(40 MG) BY MOUTH DAILY 90 tablet 1   gabapentin (NEURONTIN) 400 MG capsule Take 400 mg by mouth 3 (three) times daily.     lidocaine (XYLOCAINE) 2 % solution Use as directed 10 mLs in the mouth or throat every 6 (six) hours as needed (stomach pain). (Patient not taking: Reported on 07/29/2023) 100 mL 0   metoprolol succinate (TOPROL-XL) 50 MG 24 hr tablet Take 1 tablet (50 mg total) by mouth daily. Take with or immediately following a meal. 90 tablet 1   Multiple Vitamins-Minerals (ICAPS AREDS 2 PO) Take 1 capsule by mouth 2 (two) times daily.     Potassium Chloride ER 20 MEQ TBCR Take 20 mEq by mouth daily. 60 tablet    traMADol (ULTRAM) 50 MG tablet Take 1 tablet (50 mg total) by mouth every 12 (twelve) hours as needed. (Patient taking differently: Take 50 mg by mouth  every 12 (twelve) hours as needed for moderate pain.)     No current facility-administered medications for this visit.    PHYSICAL EXAMINATION: ECOG PERFORMANCE STATUS: 1 - Symptomatic but completely ambulatory  There were no vitals filed for this visit. Wt Readings from Last 3 Encounters:  08/12/23 153 lb 8 oz (69.6 kg)  08/03/23 152 lb 1.9 oz (69 kg)  07/07/23 153 lb 14.4 oz (69.8 kg)       LABORATORY DATA:  I have reviewed the data as listed    Latest Ref Rng & Units 08/06/2023   12:31 PM 07/02/2023    3:37 PM 11/26/2022   11:49 PM  CBC  WBC 4.0 - 10.5 K/uL 7.9  5.9  8.3   Hemoglobin 12.0 - 15.0 g/dL 32.3  55.7  8.3   Hematocrit 36.0 - 46.0 % 33.2  32.7  29.6   Platelets 150 - 400 K/uL 204  152  220         Latest Ref Rng & Units 08/06/2023   12:31 PM 07/07/2023    3:18 PM 07/02/2023    3:37 PM  CMP  Glucose 70 - 99 mg/dL 80  87  322   BUN 8 - 23 mg/dL 14  11  9    Creatinine 0.44 - 1.00 mg/dL 0.25  4.27  0.62   Sodium 135 - 145 mmol/L 142  142  141   Potassium 3.5 - 5.1 mmol/L 4.3  4.4  2.7   Chloride 98 - 111 mmol/L 105  104   107   CO2 22 - 32 mmol/L 30  32  25   Calcium 8.9 - 10.3 mg/dL 8.8  9.0  7.5   Total Protein 6.5 - 8.1 g/dL 6.8  6.8  5.5   Total Bilirubin 0.3 - 1.2 mg/dL 0.4  0.5  0.4   Alkaline Phos 38 - 126 U/L 49  50  44   AST 15 - 41 U/L 16  14  16    ALT 0 - 44 U/L 9  6  9        RADIOGRAPHIC STUDIES: I have personally reviewed the radiological images as listed and agreed with the findings in the report. No results found.    No orders of the defined types were placed in this encounter.   I discussed the assessment and treatment plan with the patient. The patient was provided an opportunity to ask questions and all were answered. The patient agreed with the plan and demonstrated an understanding of the instructions.   The patient was advised to call back or seek an in-person evaluation if the symptoms worsen or if the condition fails to improve as anticipated.  I provided 12 minutes of non face-to-face telephone visit time during this encounter, and > 50% was spent counseling as documented under my assessment & plan.     Malachy Mood, MD 09/07/2023

## 2023-09-23 NOTE — Progress Notes (Signed)
Sent message, via epic in basket, requesting orders in epic from surgeon.  

## 2023-09-24 ENCOUNTER — Ambulatory Visit: Payer: Self-pay | Admitting: Surgery

## 2023-09-24 DIAGNOSIS — Z01818 Encounter for other preprocedural examination: Secondary | ICD-10-CM

## 2023-09-28 NOTE — Progress Notes (Signed)
Anesthesia Review:  PCP: Cardiologist : [name] LOV 10/29/22  Anne Roach 08/26/23- Telephone clearance  Oncology- DR Mosetta Putt  Chest x-ray : 07/02/23- 1 view  EKG : 07/02/23  Echo : 03/27/22  Monitor- 2022  Stress test: Cardiac Cath :  Activity level:  Sleep Study/ CPAP : Fasting Blood Sugar :      / Checks Blood Sugar -- times a day:   Blood Thinner/ Instructions /Last Dose: ASA / Instructions/ Last Dose :    Eliquis    DM- type  Hgba1c- 09/30/23-  Jardiance    Interpreter has been requested for DOS on front of chart.

## 2023-09-28 NOTE — Patient Instructions (Signed)
SURGICAL WAITING ROOM VISITATION  Patients having surgery or a procedure may have no more than 2 support people in the waiting area - these visitors may rotate.    Children under the age of 41 must have an adult with them who is not the patient.  Due to an increase in RSV and influenza rates and associated hospitalizations, children ages 25 and under may not visit patients in [facility] hospitals.  If the patient needs to stay at the hospital during part of their recovery, the visitor guidelines for inpatient rooms apply. Pre-op nurse will coordinate an appropriate time for 1 support person to accompany patient in pre-op.  This support person may not rotate.    Please refer to the [facility] website for the visitor guidelines for Inpatients (after your surgery is over and you are in a regular room).       Your procedure is scheduled on:  10/08/23    Report to [facility] Main Entrance    Report to admitting at   [phone]   Call this number if you have problems the morning of surgery [phone]  Clear liquid diet on day of bowel prep.  Follow bowel prep instruction.     After Midnight you may have the following liquids until __ 0430____ AM DAY OF SURGERY  Water Non-Citrus Juices (without pulp, NO RED-Apple, White grape, White cranberry) Black Coffee (NO MILK/CREAM OR CREAMERS, sugar ok)  Clear Tea (NO MILK/CREAM OR CREAMERS, sugar ok) regular and decaf                             Plain Jell-O (NO RED)                                           Fruit ices (not with fruit pulp, NO RED)                                     Popsicles (NO RED)                                                               Sports drinks like Gatorade (NO RED)              Drink 2 Ensure/G2 drinks AT 10:00 PM the night before surgery.        The day of surgery:  Drink ONE (1) Pre-Surgery Clear Ensure or G2 at    ( have completed by )  morning of surgery. Drink in one sitting. Do not sip.   This drink was given to you during your hospital  pre-op appointment visit. Nothing else to drink after completing the  Pre-Surgery Clear Ensure or G2.          If you have questions, please contact your surgeon's office.   FOLLOW BOWEL PREP AND ANY ADDITIONAL PRE OP INSTRUCTIONS YOU RECEIVED FROM YOUR SURGEON'S OFFICE!!!     Oral Hygiene is also important to reduce your risk of infection.  Remember - BRUSH YOUR TEETH THE MORNING OF SURGERY WITH YOUR REGULAR TOOTHPASTE  DENTURES WILL BE REMOVED PRIOR TO SURGERY PLEASE DO NOT APPLY "Poly grip" OR ADHESIVES!!!   Do NOT smoke after Midnight   Stop all vitamins and herbal supplements 7 days before surgery.   Take these medicines the morning of surgery with A SIP OF WATER:  allopuriinol, gabapentin, claritin if needed, toprol,               Jardiance - Hold for 72 hours prior to surgery.  Last dose on 10/04/2023.    DO NOT TAKE ANY ORAL DIABETIC MEDICATIONS DAY OF YOUR SURGERY  Bring CPAP mask and tubing day of surgery.                              You may not have any metal on your body including hair pins, jewelry, and body piercing             Do not wear make-up, lotions, powders, perfumes/cologne, or deodorant  Do not wear nail polish including gel and S&S, artificial/acrylic nails, or any other type of covering on natural nails including finger and toenails. If you have artificial nails, gel coating, etc. that needs to be removed by a nail salon please have this removed prior to surgery or surgery may need to be canceled/ delayed if the surgeon/ anesthesia feels like they are unable to be safely monitored.   Do not shave  48 hours prior to surgery.     Do not bring valuables to the hospital. [place] IS NOT             RESPONSIBLE   FOR VALUABLES.   Contacts, glasses, dentures or bridgework may not be worn into surgery.   Bring small overnight bag day of surgery.   DO NOT BRING YOUR  HOME MEDICATIONS TO THE HOSPITAL. PHARMACY WILL DISPENSE MEDICATIONS LISTED ON YOUR MEDICATION LIST TO YOU DURING YOUR ADMISSION IN THE HOSPITAL!    Patients discharged on the day of surgery will not be allowed to drive home.  Someone NEEDS to stay with you for the first 24 hours after anesthesia.   Special Instructions: Bring a copy of your healthcare power of attorney and living will documents the day of surgery if you haven't scanned them before.              Please read over the following fact sheets you were given: IF YOU HAVE QUESTIONS ABOUT YOUR PRE-OP INSTRUCTIONS PLEASE CALL [phone]   If you received a COVID test during your pre-op visit  it is requested that you wear a mask when out in public, stay away from anyone that may not be feeling well and notify your surgeon if you develop symptoms. If you test positive for Covid or have been in contact with anyone that has tested positive in the last 10 days please notify you surgeon.    [facility] - Preparing for Surgery Before surgery, you can play an important role.  Because skin is not sterile, your skin needs to be as free of germs as possible.  You can reduce the number of germs on your skin by washing with CHG (chlorahexidine gluconate) soap before surgery.  CHG is an antiseptic cleaner which kills germs and bonds with the skin to continue killing germs even after washing. Please DO NOT use if you have an allergy to CHG or antibacterial soaps.  If your skin becomes reddened/irritated stop using the CHG and inform your nurse when you arrive at Short Stay. Do not shave (including legs and underarms) for at least 48 hours prior to the first CHG shower.  You may shave your face/neck. Please follow these instructions carefully:  1.  Shower with CHG Soap the night before surgery and the  morning of Surgery.  2.  If you choose to wash your hair, wash your hair first as usual with your  normal  shampoo.  3.  After you shampoo, rinse your  hair and body thoroughly to remove the  shampoo.                           4.  Use CHG as you would any other liquid soap.  You can apply chg directly  to the skin and wash                       Gently with a scrungie or clean washcloth.  5.  Apply the CHG Soap to your body ONLY FROM THE NECK DOWN.   Do not use on face/ open                           Wound or open sores. Avoid contact with eyes, ears mouth and genitals (private parts).                       Wash face,  Genitals (private parts) with your normal soap.             6.  Wash thoroughly, paying special attention to the area where your surgery  will be performed.  7.  Thoroughly rinse your body with warm water from the neck down.  8.  DO NOT shower/wash with your normal soap after using and rinsing off  the CHG Soap.                9.  Pat yourself dry with a clean towel.            10.  Wear clean pajamas.            11.  Place clean sheets on your bed the night of your first shower and do not  sleep with pets. Day of Surgery : Do not apply any lotions/deodorants the morning of surgery.  Please wear clean clothes to the hospital/surgery center.  FAILURE TO FOLLOW THESE INSTRUCTIONS MAY RESULT IN THE CANCELLATION OF YOUR SURGERY PATIENT SIGNATURE_________________________________  NURSE SIGNATURE__________________________________  ________________________________________________________________________

## 2023-09-30 ENCOUNTER — Encounter (HOSPITAL_COMMUNITY)
Admission: RE | Admit: 2023-09-30 | Discharge: 2023-09-30 | Disposition: A | Discharge status: Home / Self Care | Payer: Medicare HMO | Source: Ambulatory Visit | Attending: Surgery | Admitting: Surgery

## 2023-09-30 ENCOUNTER — Encounter (HOSPITAL_COMMUNITY): Payer: Self-pay

## 2023-09-30 ENCOUNTER — Other Ambulatory Visit: Payer: Self-pay

## 2023-09-30 DIAGNOSIS — N189 Chronic kidney disease, unspecified: Secondary | ICD-10-CM | POA: Diagnosis not present

## 2023-09-30 DIAGNOSIS — I130 Hypertensive heart and chronic kidney disease with heart failure and stage 1 through stage 4 chronic kidney disease, or unspecified chronic kidney disease: Secondary | ICD-10-CM | POA: Insufficient documentation

## 2023-09-30 DIAGNOSIS — I4891 Unspecified atrial fibrillation: Secondary | ICD-10-CM | POA: Diagnosis not present

## 2023-09-30 DIAGNOSIS — Z01812 Encounter for preprocedural laboratory examination: Secondary | ICD-10-CM | POA: Insufficient documentation

## 2023-09-30 DIAGNOSIS — C182 Malignant neoplasm of ascending colon: Secondary | ICD-10-CM | POA: Insufficient documentation

## 2023-09-30 DIAGNOSIS — I5030 Unspecified diastolic (congestive) heart failure: Secondary | ICD-10-CM | POA: Diagnosis not present

## 2023-09-30 DIAGNOSIS — G4733 Obstructive sleep apnea (adult) (pediatric): Secondary | ICD-10-CM | POA: Diagnosis not present

## 2023-09-30 DIAGNOSIS — E1122 Type 2 diabetes mellitus with diabetic chronic kidney disease: Secondary | ICD-10-CM | POA: Diagnosis not present

## 2023-09-30 DIAGNOSIS — Z01818 Encounter for other preprocedural examination: Secondary | ICD-10-CM

## 2023-09-30 HISTORY — DX: Unspecified osteoarthritis, unspecified site: M19.90

## 2023-09-30 HISTORY — DX: Unspecified atrial fibrillation: I48.91

## 2023-09-30 HISTORY — DX: Malignant (primary) neoplasm, unspecified: C80.1

## 2023-09-30 HISTORY — DX: Sleep apnea, unspecified: G47.30

## 2023-09-30 HISTORY — DX: Anemia, unspecified: D64.9

## 2023-09-30 HISTORY — DX: Tremor, unspecified: R25.1

## 2023-09-30 LAB — CBC WITH DIFFERENTIAL/PLATELET
Abs Immature Granulocytes: 0.04 10*3/uL (ref 0.00–0.07)
Basophils Absolute: 0.1 10*3/uL (ref 0.0–0.1)
Basophils Relative: 1 %
Eosinophils Absolute: 0.3 10*3/uL (ref 0.0–0.5)
Eosinophils Relative: 4 %
HCT: 30.9 % — ABNORMAL LOW (ref 36.0–46.0)
Hemoglobin: 9.5 g/dL — ABNORMAL LOW (ref 12.0–15.0)
Immature Granulocytes: 1 %
Lymphocytes Relative: 27 %
Lymphs Abs: 2.1 10*3/uL (ref 0.7–4.0)
MCH: 30.8 pg (ref 26.0–34.0)
MCHC: 30.7 g/dL (ref 30.0–36.0)
MCV: 100.3 fL — ABNORMAL HIGH (ref 80.0–100.0)
Monocytes Absolute: 0.7 10*3/uL (ref 0.1–1.0)
Monocytes Relative: 9 %
Neutro Abs: 4.6 10*3/uL (ref 1.7–7.7)
Neutrophils Relative %: 58 %
Platelets: 242 10*3/uL (ref 150–400)
RBC: 3.08 MIL/uL — ABNORMAL LOW (ref 3.87–5.11)
RDW: 14.6 % (ref 11.5–15.5)
WBC: 7.7 10*3/uL (ref 4.0–10.5)
nRBC: 0 % (ref 0.0–0.2)

## 2023-09-30 LAB — COMPREHENSIVE METABOLIC PANEL
ALT: 10 U/L (ref 0–44)
AST: 18 U/L (ref 15–41)
Albumin: 3.1 g/dL — ABNORMAL LOW (ref 3.5–5.0)
Alkaline Phosphatase: 46 U/L (ref 38–126)
Anion gap: 11 (ref 5–15)
BUN: 15 mg/dL (ref 8–23)
CO2: 25 mmol/L (ref 22–32)
Calcium: 8.8 mg/dL — ABNORMAL LOW (ref 8.9–10.3)
Chloride: 105 mmol/L (ref 98–111)
Creatinine, Ser: 1.1 mg/dL — ABNORMAL HIGH (ref 0.44–1.00)
GFR, Estimated: 48 mL/min — ABNORMAL LOW (ref >60)
Glucose, Bld: 107 mg/dL — ABNORMAL HIGH (ref 70–99)
Potassium: 3.9 mmol/L (ref 3.5–5.1)
Sodium: 141 mmol/L (ref 135–145)
Total Bilirubin: 0.4 mg/dL (ref <1.2)
Total Protein: 6.5 g/dL (ref 6.5–8.1)

## 2023-09-30 LAB — HEMOGLOBIN A1C
Hgb A1c MFr Bld: 5.3 % (ref 4.8–5.6)
Mean Plasma Glucose: 105.41 mg/dL

## 2023-09-30 LAB — GLUCOSE, CAPILLARY: Glucose-Capillary: 113 mg/dL — ABNORMAL HIGH (ref 70–99)

## 2023-09-30 NOTE — Progress Notes (Signed)
Case: [id number] Date/Time: 10/08/23 0715   Procedure: LAPAROSCOPIC RIGHT HEMICOLECTOMY (Right)   Anesthesia type: General   Pre-op diagnosis: CANCER OF RIGHT COLON   Location: [facility] ROOM 02 / [facility]   Surgeons: Andria Meuse, MD       DISCUSSION: Anne Roach is an 89 yo female who presents to PAT prior to surgery above. PMH of HTN, A.fib on Eliquis, HFpEF, OSA (no CPAP use), DM, CKD, recent diagnosis of colon cancer.  Patient had a colonoscopy on 07/31/23. No complications noted during this.  Patient follows with Cardiology for hx of A.fib on Eliquis and HFpEF. Last seen in clinic by Dr. Eden Emms on 10/29/2022. Noted to be euvolemic and stable on current regimen. Advised f/u in 6 months. Cardiac clearance via phone visit done on 08/27/23:  "Chart reviewed as part of pre-operative protocol coverage. Patient was contacted 08/27/2023 in reference to pre-operative risk assessment for pending surgery as outlined below. Spoke with pt's daughter (DPR on file).  Anne Roach was last seen virtually on 07/29/2023 by Edd Fabian, NP.  Since that day, Anne Roach has done well from a cardiac standpoint. She denies any new symptoms or concerns.    Therefore, based on ACC/AHA guidelines, the patient would be at acceptable risk for the planned procedure without further cardiovascular testing.    Per office protocol, patient can hold Eliquis for 3 days prior to procedure.   Patient will not need bridging with Lovenox (enoxaparin) around procedure. Please resume Eliquis as soon as possible postprocedure, at the discretion of the surgeon.    The patient's daughter was advised that if she develops new symptoms prior to surgery to contact our office to arrange for a follow-up visit, and she verbalized understanding."  VS: BP 102/62   Pulse 69   Temp 36.8 C (Oral)   Resp 16   Ht 5' 4.96" (1.65 m)   SpO2 99%   BMI 25.57 kg/m   PROVIDERS: Leilani Able, MD Cardiology: [name],  MD Oncology: Malachy Mood, MD  LABS: Labs reviewed: Acceptable for surgery. Hgb down to 9.5 from 11.1 on 9/19. Likely from GIB from mass and on Eliquis. Kidney function stable. (all labs ordered are listed, but only abnormal results are displayed)  Labs Reviewed  CBC WITH DIFFERENTIAL/PLATELET - Abnormal; Notable for the following components:      Result Value   RBC 3.08 (*)    Hemoglobin 9.5 (*)    HCT 30.9 (*)    MCV 100.3 (*)    All other components within normal limits  COMPREHENSIVE METABOLIC PANEL - Abnormal; Notable for the following components:   Glucose, Bld 107 (*)    Creatinine, Ser 1.10 (*)    Calcium 8.8 (*)    Albumin 3.1 (*)    GFR, Estimated 48 (*)    All other components within normal limits  GLUCOSE, CAPILLARY - Abnormal; Notable for the following components:   Glucose-Capillary 113 (*)    All other components within normal limits  HEMOGLOBIN A1C  TYPE AND SCREEN     IMAGES:  PET scan 09/03/23:  IMPRESSION: 1. The cecal mass is tracer avid consistent with primary colon cancer. 2. There are tracer avid ileocolic lymph nodes compatible with nodal metastasis. 3. Mild, nonspecific tracer uptake associated with subcarinal and right hilar lymph nodes. 4. Small left pleural effusion is mildly increased in volume compared with the previous exam. 5. Stable appearance of cystic lesion containing coarse calcifications associated with the uncinate process of  pancreas. This demonstrates low level FDG uptake. Attention on follow-up imaging is advised. 6. Coronary artery calcifications. 7.  Aortic Atherosclerosis (ICD10-I70.0).  CTA chest/abd/pelvis 07/02/23:  IMPRESSION: No evidence of thoracic aortic aneurysm or dissection.   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   4.0 cm anterior cecal mass, highly suspicious for colonic adenocarcinoma. No convincing pericolonic  extension. Ileocolic nodes measuring up to 13 mm short axis, suggesting regional nodal metastases.   Mild mediastinal lymphadenopathy, indeterminate.  EKG 07/02/2023  A.fib, rate 95 Prolonged QT  CV:  Echo 03/27/2022:  IMPRESSIONS    1. Left ventricular ejection fraction, by estimation, is 50 to 55%. The left ventricle has low normal function. The left ventricle demonstrates global hypokinesis. There is mild left ventricular hypertrophy. Left ventricular diastolic function could not  be evaluated. There is incoordinate septal motion.  2. Right ventricular systolic function is normal. The right ventricular size is normal. There is mildly elevated pulmonary artery systolic pressure. The estimated right ventricular systolic pressure is 40.9 mmHg.  3. Left atrial size was severely dilated.  4. Right atrial size was moderately dilated.  5. A small pericardial effusion is present. The pericardial effusion is posterior to the left ventricle.  6. The mitral valve is abnormal. Mild mitral valve regurgitation.  7. The aortic valve is tricuspid. Aortic valve regurgitation is not visualized. Aortic valve sclerosis is present, with no evidence of aortic valve stenosis.  8. The inferior vena cava is normal in size with greater than 50% respiratory variability, suggesting right atrial pressure of 3 mmHg.  Comparison(s): No significant change from prior study. 11/01/2020: LVEF 50-55%.  Holter monitor 02/12/2021:  Persistent atrial fibrillation (burden 100%) Heart rates range from 36-161 bpm (avg of 85 bpm)  Past Medical History:  Diagnosis Date   A-fib (HCC)    Acute on chronic diastolic CHF (congestive heart failure) (HCC) 03/27/2022   Anemia    Arthritis    Atrial fibrillation (HCC)    Atrial fibrillation with RVR (HCC) 03/20/2017   Cancer (HCC)    colon cancer   Chronic diastolic CHF (congestive heart failure) (HCC) 10/31/2020   DM (diabetes mellitus) (HCC)    HTN  (hypertension)    Occasional tremors    on Inderal for this   Sepsis secondary to UTI (HCC) 07/11/2020   Sleep apnea    not used cpap in a long time per daughter   Tremor     Past Surgical History:  Procedure Laterality Date   APPENDECTOMY     BIOPSY  12/23/2022   Procedure: BIOPSY;  Surgeon: Kerin Salen, MD;  Location: [facility];  Service: Gastroenterology;;   BIOPSY  08/03/2023   Procedure: BIOPSY;  Surgeon: Kerin Salen, MD;  [facility];  Service: Gastroenterology;;   CARPAL TUNNEL RELEASE     CATARACT EXTRACTION Bilateral    COLONOSCOPY WITH PROPOFOL N/A 08/03/2023   Procedure: COLONOSCOPY WITH PROPOFOL;  Surgeon: Kerin Salen, MD;  [facility];  Service: Gastroenterology;  Laterality: N/A;   ESOPHAGEAL DILATION  12/23/2022   Procedure: ESOPHAGEAL DILATION;  Surgeon: Kerin Salen, MD;  [facility];  Service: Gastroenterology;;   ESOPHAGOGASTRODUODENOSCOPY (EGD) WITH PROPOFOL N/A 12/23/2022   Procedure: ESOPHAGOGASTRODUODENOSCOPY (EGD) WITH PROPOFOL W/ DILATION;  Surgeon: Kerin Salen, MD;  [facility];  Service: Gastroenterology;  Laterality: N/A;  with dilation   HERNIA REPAIR     POLYPECTOMY  08/03/2023   Procedure: POLYPECTOMY;  Surgeon: Kerin Salen, MD;  [facility];  Service: Gastroenterology;;   tarsal tunnel surgery       MEDICATIONS:  ACCU-CHEK AVIVA PLUS test strip   acetaminophen (TYLENOL) 500 MG tablet   allopurinol (ZYLOPRIM) 100 MG tablet   Cholecalciferol (VITAMIN D) 125 MCG (5000 UT) CAPS   donepezil (ARICEPT) 5 MG tablet   ELIQUIS 5 MG TABS tablet   empagliflozin (JARDIANCE) 10 MG TABS tablet   ferrous sulfate 325 (65 FE) MG EC tablet   fluticasone (FLONASE) 50 MCG/ACT nasal spray   folic acid (FOLVITE) 800 MCG tablet   furosemide (LASIX) 40 MG tablet   gabapentin (NEURONTIN) 400 MG capsule   Iron-FA-B Cmp-C-Biot-Probiotic (FUSION PLUS) CAPS   lidocaine (XYLOCAINE) 2 % solution   loratadine  (CLARITIN) 10 MG tablet   metoprolol succinate (TOPROL-XL) 50 MG 24 hr tablet   Multiple Vitamins-Minerals (ICAPS AREDS 2 PO)   Potassium Chloride ER 20 MEQ TBCR   propranolol (INDERAL) 20 MG tablet   traMADol (ULTRAM) 50 MG tablet   No current facility-administered medications for this encounter.   Marcille Blanco [facility] Short Stay/Anesthesiology [facility] Phone [phone] 09/30/2023 1:00 PM

## 2023-10-07 NOTE — H&P (Signed)
CC: Here today for surgery  HPI: Anne Roach is an 89 y.o. female with history of afib (on Eliquis), gout, CHF, whom is seen in the office today as a referral by Dr. Mosetta Putt for evaluation of colon cancer.   CT CAP (dissection protocol) 07/02/23 IMPRESSION: No evidence of thoracic aortic aneurysm or dissection.  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  4.0 cm anterior cecal mass, highly suspicious for colonic adenocarcinoma. No convincing pericolonic extension. Ileocolic nodes measuring up to 13 mm short axis, suggesting regional nodal metastases.  Mild mediastinal lymphadenopathy, indeterminate.  Colonoscopy with Dr. Pati Gallo 08/03/2023: - Hemorrhoids - Seven 5 - 8 mm polyps removed - Malignant partially obstructing tumor in cecum. Biopsied. - Medium size lipoma in sigmoid - Nonbleeding internal hemorrhoids  PATH A. ASCENDING, TRANSVERSE AND SIGMOID COLON, POLYPECTOMY:  Fragments of invasive moderately differentiated adenocarcinoma (see  comment)  Tubular adenoma, 7 fragments  Negative for high-grade dysplasia   B. CECUM, MASS, BIOPSY:  Invasive moderately differentiated adenocarcinoma   COMMENT:   The fragments of invasive moderately differentiated adenocarcinoma  present within the ascending, transverse and sigmoid colon polypectomies  (A) shows similar histomorphology as a cecal mass (B) and may represent  carryover. Clinical and colonoscopic correlation is recommended.   She met with Dr. Mosetta Putt 08/12/2023 She noted that there is no signs of definitive metastatic disease but suspicious lymph nodes in the mediastinum, up to 1.8 cm but that these have been present since May 2023. She has therefore been referred to see [place] to consider surgical resection.  PET scan pending  CEA 08/06/23 - 3.33 (3.16 07/07/23) Albumin 3.4 (08/06/23)  ECHO 03/2022 - EF 50-55%; mild LVH, normal RV fxn,  LA dilation, RA dilation, small effusion; Mitral regurg mild  She follows with cardiology and recently saw Edd Fabian, NP; primary cardiologist is Dr. [name].  She was today with her daughter whom she lives with. Her daughter and her were offered and Arabic interpreter but have declined. Her daughter does report that she is quite independent in her daily activities including bathing and eating. Mobilizes at home with the assistance of a walker. If they are going on long outings, she will use a wheelchair for assistance. She denies any evident significant memory deficits.  Appetite has not been great but she has been doing Ensure supplementation as well. No nausea or vomiting reported. She is having regular bowel movements. Occasional darker colored stool.  CT PET with Dr. Mosetta Putt 09/02/20 IMPRESSION: 1. The cecal mass is tracer avid consistent with primary colon cancer. 2. There are tracer avid ileocolic lymph nodes compatible with nodal metastasis. 3. Mild, nonspecific tracer uptake associated with subcarinal and right hilar lymph nodes. 4. Small left pleural effusion is mildly increased in volume compared with the previous exam. 5. Stable appearance of cystic lesion containing coarse calcifications associated with the uncinate process of pancreas. This demonstrates low level FDG uptake. Attention on follow-up imaging is advised. 6. Coronary artery calcifications. 7.  Aortic Atherosclerosis (ICD10-I70.0). She denies any changes in health or health history since we met in the office. No new medications/allergies. She states she is ready for surgery today. She tolerated bowel prep with satisfactory result. Has been off Eliquis since Monday.  PMH: afib (on Eliquis), gout, CHF  PSH: Umbilical hernia surgery in 1980s and because of strip; carpal tunnel 1985; tarsal tunnel unknown; appendectomy possibly - can't remember  FHx: Daughter had breast cancer, otherwise  denies any known  family history of colorectal, breast, endometrial or ovarian cancer  Social Hx: Denies use of tobacco/EtOH/illicit drug. She is retired. She is accompanied today by her daughter. [place] Her daughter actually ran a lab at the hospital [name]   Past Medical History:  Diagnosis Date   A-fib [facility])    Acute on chronic diastolic CHF (congestive heart failure) (HCC) 03/27/2022   Anemia    Arthritis    Atrial fibrillation (HCC)    Atrial fibrillation with RVR (HCC) 03/20/2017   Cancer (HCC)    colon cancer   Chronic diastolic CHF (congestive heart failure) (HCC) 10/31/2020   DM (diabetes mellitus) (HCC)    HTN (hypertension)    Occasional tremors    on Inderal for this   Sepsis secondary to UTI (HCC) 07/11/2020   Sleep apnea    not used cpap in a long time per daughter   Tremor     Past Surgical History:  Procedure Laterality Date   APPENDECTOMY     BIOPSY  12/23/2022   Procedure: BIOPSY;  Surgeon: Kerin Salen, MD;  Location: [facility];  Service: Gastroenterology;;   BIOPSY  08/03/2023   Procedure: BIOPSY;  Surgeon: Kerin Salen, MD;  [facility];  Service: Gastroenterology;;   CARPAL TUNNEL RELEASE     CATARACT EXTRACTION Bilateral    COLONOSCOPY WITH PROPOFOL N/A 08/03/2023   Procedure: COLONOSCOPY WITH PROPOFOL;  Surgeon: Kerin Salen, MD;  [facility];  Service: Gastroenterology;  Laterality: N/A;   ESOPHAGEAL DILATION  12/23/2022   Procedure: ESOPHAGEAL DILATION;  Surgeon: Kerin Salen, MD;  [facility];  Service: Gastroenterology;;   ESOPHAGOGASTRODUODENOSCOPY (EGD) WITH PROPOFOL N/A 12/23/2022   Procedure: ESOPHAGOGASTRODUODENOSCOPY (EGD) WITH PROPOFOL W/ DILATION;  Surgeon: Kerin Salen, MD;  [facility];  Service: Gastroenterology;  Laterality: N/A;  with dilation   HERNIA REPAIR     POLYPECTOMY  08/03/2023   Procedure: POLYPECTOMY;  Surgeon: Kerin Salen, MD;  [facility];  Service:  Gastroenterology;;   tarsal tunnel surgery       Family History  Problem Relation Age of Onset   Diabetes Father    Asthma Brother    Cancer Daughter 3       BREAST CANCER    Social:  reports that she has never smoked. She has never used smokeless tobacco. She reports that she does not drink alcohol and does not use drugs.  Allergies: No Known Allergies  Medications: I have reviewed the patient's current medications.  No results found for this or any previous visit (from the past 48 hour(s)).  No results found.   PE There were no vitals taken for this visit. Constitutional: NAD; conversant Eyes: Moist conjunctiva; no lid lag; anicteric Lungs: Normal respiratory effort GI: Abd soft, NT/ND; no palpable hepatosplenomegaly Psychiatric: Appropriate affect  No results found for this or any previous visit (from the past 48 hour(s)).  No results found.  A/P: Anne Roach is an 89 y.o. female with hx of afib (on Eliquis), gout, CHF, here for evaluation of invasive adenocarcinoma of the cecum  CT CAP (Dissection protocol) 07/02/23 -no definitive evidence of metastatic disease; mild mediastinal lymphadenopathy, indeterminate  CT PET Planned with Dr. Mosetta Putt - was for today but rescheduled  -Cardiac clearance with Dr. Eden Emms office obtained  -The anatomy and physiology of the GI tract was reviewed with the patient. The pathophysiology of colon cancer was discussed as well with associated pictures.  -  We have discussed various different treatment options going forward including surgery (the most definitive) to address this -laparoscopic right hemicolectomy   -The planned procedure, material risks (including, but not limited to, pain, bleeding, infection, scarring, need for blood transfusion, damage to surrounding structures- blood vessels/nerves/viscus/organs, damage to ureter, urine leak, leak from anastomosis, need for additional procedures, scenarios where a stoma may be  necessary and where it may be permanent, worsening of pre-existing medical conditions, chronic diarrhea, constipation secondary to narcotic use, hernia, recurrence, pneumonia, heart attack, stroke, death) benefits and alternatives to surgery were discussed at length. The patient's questions were answered to her satisfaction, she voiced understanding and elected to proceed with surgery. Additionally, we discussed typical postoperative expectations and the recovery process. We also discussed possibility of skilled nursing facility following recovery from surgery but that this would largely be based upon recommendations made by our Allied health service teams.  Marin Olp, MD [facility], A [facility]

## 2023-10-07 NOTE — Anesthesia Preprocedure Evaluation (Signed)
Anesthesia Evaluation  Patient identified by MRN, date of birth, ID band Patient awake    Reviewed: Allergy & Precautions, H&P , NPO status , Patient's Chart, lab work & pertinent test results  Airway Mallampati: II  TM Distance: >3 FB Neck ROM: Full    Dental  (+) Dental Advisory Given, Edentulous Upper, Chipped   Pulmonary sleep apnea , pneumonia   Pulmonary exam normal breath sounds clear to auscultation       Cardiovascular hypertension, Pt. on medications pulmonary hypertension+CHF  + dysrhythmias Atrial Fibrillation + Valvular Problems/Murmurs  Rhythm:Irregular Rate:Normal + Systolic murmurs Echo 03/2022  1. Left ventricular ejection fraction, by estimation, is 50 to 55%. The left ventricle has low normal function. The left ventricle demonstrates global hypokinesis. There is mild left ventricular hypertrophy. Left ventricular diastolic function could not be evaluated. There is incoordinate septal motion.   2. Right ventricular systolic function is normal. The right ventricular size is normal. There is mildly elevated pulmonary artery systolic pressure. The estimated right ventricular systolic pressure is 40.9 mmHg.   3. Left atrial size was severely dilated.   4. Right atrial size was moderately dilated.   5. A small pericardial effusion is present. The pericardial effusion is posterior to the left ventricle.   6. The mitral valve is abnormal. Mild mitral valve regurgitation.   7. The aortic valve is tricuspid. Aortic valve regurgitation is not visualized. Aortic valve sclerosis is present, with no evidence of aortic valve stenosis.   8. The inferior vena cava is normal in size with greater than 50% respiratory variability, suggesting right atrial pressure of 3 mmHg.   Comparison(s): No significant change from prior study. 11/01/2020: LVEF 50-55%.     Neuro/Psych negative neurological ROS  negative psych ROS   GI/Hepatic Neg  liver ROS,GERD  ,,  Endo/Other  diabetes, Type 2    Renal/GU Renal InsufficiencyRenal disease     Musculoskeletal  (+) Arthritis ,    Abdominal   Peds  Hematology  (+) Blood dyscrasia, anemia   Anesthesia Other Findings   Reproductive/Obstetrics                              Anesthesia Physical Anesthesia Plan  ASA: 4  Anesthesia Plan: General   Post-op Pain Management: Tylenol PO (pre-op)*   Induction: Intravenous  PONV Risk Score and Plan: 4 or greater and Ondansetron, Dexamethasone, Treatment may vary due to age or medical condition, Propofol infusion and TIVA  Airway Management Planned: Oral ETT  Additional Equipment: ClearSight  Intra-op Plan:   Post-operative Plan: Possible Post-op intubation/ventilation  Informed Consent: I have reviewed the patients History and Physical, chart, labs and discussed the procedure including the risks, benefits and alternatives for the proposed anesthesia with the patient or authorized representative who has indicated his/her understanding and acceptance.     Dental advisory given and Consent reviewed with POA  Plan Discussed with: CRNA  Anesthesia Plan Comments: (2 x PIV)        Anesthesia Quick Evaluation

## 2023-10-08 ENCOUNTER — Inpatient Hospital Stay (HOSPITAL_COMMUNITY): Payer: Medicare HMO | Admitting: Medical

## 2023-10-08 ENCOUNTER — Inpatient Hospital Stay (HOSPITAL_COMMUNITY): Payer: Self-pay | Admitting: Anesthesiology

## 2023-10-08 ENCOUNTER — Other Ambulatory Visit: Payer: Self-pay

## 2023-10-08 ENCOUNTER — Encounter (HOSPITAL_COMMUNITY): Payer: Self-pay | Admitting: Surgery

## 2023-10-08 ENCOUNTER — Inpatient Hospital Stay (HOSPITAL_COMMUNITY)
Admission: RE | Admit: 2023-10-08 | Discharge: 2023-10-12 | DRG: 330 | Disposition: A | Discharge status: Home Health | Payer: Medicare HMO | Attending: Surgery | Admitting: Surgery

## 2023-10-08 ENCOUNTER — Encounter (HOSPITAL_COMMUNITY)
Admission: RE | Disposition: A | Discharge status: Home Health | Payer: Self-pay | Source: Home / Self Care | Attending: Surgery

## 2023-10-08 DIAGNOSIS — C180 Malignant neoplasm of cecum: Secondary | ICD-10-CM

## 2023-10-08 DIAGNOSIS — Z833 Family history of diabetes mellitus: Secondary | ICD-10-CM | POA: Diagnosis not present

## 2023-10-08 DIAGNOSIS — K921 Melena: Secondary | ICD-10-CM | POA: Diagnosis present

## 2023-10-08 DIAGNOSIS — I5032 Chronic diastolic (congestive) heart failure: Secondary | ICD-10-CM | POA: Diagnosis present

## 2023-10-08 DIAGNOSIS — Z9181 History of falling: Secondary | ICD-10-CM | POA: Diagnosis not present

## 2023-10-08 DIAGNOSIS — Z01818 Encounter for other preprocedural examination: Secondary | ICD-10-CM

## 2023-10-08 DIAGNOSIS — C772 Secondary and unspecified malignant neoplasm of intra-abdominal lymph nodes: Secondary | ICD-10-CM | POA: Diagnosis present

## 2023-10-08 DIAGNOSIS — D62 Acute posthemorrhagic anemia: Secondary | ICD-10-CM | POA: Diagnosis not present

## 2023-10-08 DIAGNOSIS — I110 Hypertensive heart disease with heart failure: Secondary | ICD-10-CM | POA: Diagnosis present

## 2023-10-08 DIAGNOSIS — Z803 Family history of malignant neoplasm of breast: Secondary | ICD-10-CM | POA: Diagnosis not present

## 2023-10-08 DIAGNOSIS — K660 Peritoneal adhesions (postprocedural) (postinfection): Secondary | ICD-10-CM | POA: Diagnosis present

## 2023-10-08 DIAGNOSIS — F0390 Unspecified dementia without behavioral disturbance: Secondary | ICD-10-CM | POA: Diagnosis present

## 2023-10-08 DIAGNOSIS — Z9049 Acquired absence of other specified parts of digestive tract: Principal | ICD-10-CM

## 2023-10-08 DIAGNOSIS — E1159 Type 2 diabetes mellitus with other circulatory complications: Secondary | ICD-10-CM | POA: Diagnosis not present

## 2023-10-08 DIAGNOSIS — Z7984 Long term (current) use of oral hypoglycemic drugs: Secondary | ICD-10-CM | POA: Diagnosis not present

## 2023-10-08 DIAGNOSIS — M25561 Pain in right knee: Secondary | ICD-10-CM | POA: Diagnosis not present

## 2023-10-08 DIAGNOSIS — M109 Gout, unspecified: Secondary | ICD-10-CM | POA: Diagnosis present

## 2023-10-08 DIAGNOSIS — Z79899 Other long term (current) drug therapy: Secondary | ICD-10-CM

## 2023-10-08 DIAGNOSIS — K219 Gastro-esophageal reflux disease without esophagitis: Secondary | ICD-10-CM | POA: Diagnosis present

## 2023-10-08 DIAGNOSIS — I482 Chronic atrial fibrillation, unspecified: Secondary | ICD-10-CM | POA: Diagnosis present

## 2023-10-08 DIAGNOSIS — I10 Essential (primary) hypertension: Secondary | ICD-10-CM | POA: Diagnosis present

## 2023-10-08 DIAGNOSIS — Z825 Family history of asthma and other chronic lower respiratory diseases: Secondary | ICD-10-CM

## 2023-10-08 DIAGNOSIS — I2510 Atherosclerotic heart disease of native coronary artery without angina pectoris: Secondary | ICD-10-CM | POA: Diagnosis present

## 2023-10-08 DIAGNOSIS — N179 Acute kidney failure, unspecified: Secondary | ICD-10-CM | POA: Diagnosis present

## 2023-10-08 DIAGNOSIS — Z7901 Long term (current) use of anticoagulants: Secondary | ICD-10-CM

## 2023-10-08 DIAGNOSIS — E119 Type 2 diabetes mellitus without complications: Secondary | ICD-10-CM | POA: Diagnosis present

## 2023-10-08 DIAGNOSIS — D649 Anemia, unspecified: Secondary | ICD-10-CM | POA: Diagnosis not present

## 2023-10-08 HISTORY — PX: LAPAROSCOPIC RIGHT HEMI COLECTOMY: SHX5926

## 2023-10-08 LAB — TYPE AND SCREEN
ABO/RH(D): B POS
Antibody Screen: NEGATIVE

## 2023-10-08 LAB — ABO/RH: ABO/RH(D): B POS

## 2023-10-08 LAB — GLUCOSE, CAPILLARY
Glucose-Capillary: 115 mg/dL — ABNORMAL HIGH (ref 70–99)
Glucose-Capillary: 147 mg/dL — ABNORMAL HIGH (ref 70–99)
Glucose-Capillary: 133 mg/dL — ABNORMAL HIGH (ref 70–99)

## 2023-10-08 SURGERY — LAPAROSCOPIC RIGHT HEMI COLECTOMY
Anesthesia: General | Laterality: Right

## 2023-10-08 MED ORDER — INSULIN ASPART 100 UNIT/ML IJ SOLN: 0.0000 [IU] | INTRAMUSCULAR | Status: DC | PRN

## 2023-10-08 MED ORDER — ACETAMINOPHEN 500 MG PO TABS
1000.0000 mg | ORAL_TABLET | Freq: Once | ORAL | Status: DC
  Filled 2023-10-08: qty 2

## 2023-10-08 MED ORDER — LORATADINE 10 MG PO TABS: 10.0000 mg | ORAL_TABLET | Freq: Every day | ORAL | Status: DC | PRN

## 2023-10-08 MED ORDER — LABETALOL HCL 5 MG/ML IV SOLN
INTRAVENOUS | Status: DC | PRN
Start: 2023-10-08 — End: 2023-10-08
  Administered 2023-10-08: 5 mg via INTRAVENOUS

## 2023-10-08 MED ORDER — FUROSEMIDE 40 MG PO TABS
40.0000 mg | ORAL_TABLET | Freq: Every day | ORAL | Status: DC
  Administered 2023-10-09 – 2023-10-10 (×2): 40 mg via ORAL
  Filled 2023-10-08 (×2): qty 1

## 2023-10-08 MED ORDER — PROPOFOL 10 MG/ML IV BOLUS
INTRAVENOUS | Status: DC | PRN
  Administered 2023-10-08: 90 mg via INTRAVENOUS
  Administered 2023-10-08: 100 ug/kg/min via INTRAVENOUS
  Administered 2023-10-08: 20 mg via INTRAVENOUS

## 2023-10-08 MED ORDER — PROPRANOLOL HCL 10 MG PO TABS
20.0000 mg | ORAL_TABLET | Freq: Two times a day (BID) | ORAL | Status: DC
  Administered 2023-10-08 – 2023-10-12 (×8): 20 mg via ORAL
  Filled 2023-10-08 (×8): qty 2

## 2023-10-08 MED ORDER — POLYETHYLENE GLYCOL 3350 17 GM/SCOOP PO POWD: 1.0000 | Freq: Once | ORAL | Status: DC

## 2023-10-08 MED ORDER — GABAPENTIN 400 MG PO CAPS
400.0000 mg | ORAL_CAPSULE | Freq: Three times a day (TID) | ORAL | Status: DC
  Administered 2023-10-08 – 2023-10-12 (×12): 400 mg via ORAL
  Filled 2023-10-08 (×12): qty 1

## 2023-10-08 MED ORDER — CHLORHEXIDINE GLUCONATE CLOTH 2 % EX PADS: 6.0000 | MEDICATED_PAD | Freq: Once | CUTANEOUS | Status: DC

## 2023-10-08 MED ORDER — HYDRALAZINE HCL 20 MG/ML IJ SOLN
10.0000 mg | INTRAMUSCULAR | Status: DC | PRN
Start: 2023-10-08 — End: 2023-10-12

## 2023-10-08 MED ORDER — METRONIDAZOLE 500 MG PO TABS: 1000.0000 mg | ORAL_TABLET | ORAL | Status: DC

## 2023-10-08 MED ORDER — SODIUM CHLORIDE 0.9 % IV SOLN
2.0000 g | INTRAVENOUS | Status: AC
  Administered 2023-10-08: 2 g via INTRAVENOUS
  Filled 2023-10-08: qty 2

## 2023-10-08 MED ORDER — LACTATED RINGERS IV SOLN: INTRAVENOUS | Status: DC | PRN

## 2023-10-08 MED ORDER — SIMETHICONE 80 MG PO CHEW: 40.0000 mg | CHEWABLE_TABLET | Freq: Four times a day (QID) | ORAL | Status: DC | PRN

## 2023-10-08 MED ORDER — DEXAMETHASONE SODIUM PHOSPHATE 10 MG/ML IJ SOLN
INTRAMUSCULAR | Status: DC | PRN
  Administered 2023-10-08: 5 mg via INTRAVENOUS

## 2023-10-08 MED ORDER — LACTATED RINGERS IV SOLN: INTRAVENOUS | Status: DC

## 2023-10-08 MED ORDER — VITAMIN D3 25 MCG (1000 UNIT) PO TABS
5000.0000 [IU] | ORAL_TABLET | Freq: Every day | ORAL | Status: DC
  Administered 2023-10-09 – 2023-10-12 (×4): 5000 [IU] via ORAL
  Filled 2023-10-08 (×4): qty 5

## 2023-10-08 MED ORDER — PHENYLEPHRINE HCL (PRESSORS) 10 MG/ML IV SOLN
INTRAVENOUS | Status: AC
  Filled 2023-10-08: qty 1

## 2023-10-08 MED ORDER — DIPHENHYDRAMINE HCL 50 MG/ML IJ SOLN: 12.5000 mg | Freq: Four times a day (QID) | INTRAMUSCULAR | Status: DC | PRN

## 2023-10-08 MED ORDER — FENTANYL CITRATE (PF) 250 MCG/5ML IJ SOLN
INTRAMUSCULAR | Status: AC
  Filled 2023-10-08: qty 5

## 2023-10-08 MED ORDER — FENTANYL CITRATE (PF) 100 MCG/2ML IJ SOLN
INTRAMUSCULAR | Status: DC | PRN
  Administered 2023-10-08: 50 ug via INTRAVENOUS
  Administered 2023-10-08: 100 ug via INTRAVENOUS

## 2023-10-08 MED ORDER — FOLIC ACID 1 MG PO TABS
1000.0000 ug | ORAL_TABLET | Freq: Every day | ORAL | Status: DC
  Administered 2023-10-09 – 2023-10-12 (×4): 1 mg via ORAL
  Filled 2023-10-08 (×4): qty 1

## 2023-10-08 MED ORDER — METOPROLOL SUCCINATE ER 50 MG PO TB24
50.0000 mg | ORAL_TABLET | Freq: Every day | ORAL | Status: DC
  Administered 2023-10-09 – 2023-10-12 (×4): 50 mg via ORAL
  Filled 2023-10-08 (×4): qty 1

## 2023-10-08 MED ORDER — BUPIVACAINE LIPOSOME 1.3 % IJ SUSP
INTRAMUSCULAR | Status: AC
  Filled 2023-10-08: qty 20

## 2023-10-08 MED ORDER — NEOMYCIN SULFATE 500 MG PO TABS: 1000.0000 mg | ORAL_TABLET | ORAL | Status: DC

## 2023-10-08 MED ORDER — ONDANSETRON HCL 4 MG PO TABS: 4.0000 mg | ORAL_TABLET | Freq: Four times a day (QID) | ORAL | Status: DC | PRN

## 2023-10-08 MED ORDER — ALUM & MAG HYDROXIDE-SIMETH 200-200-20 MG/5ML PO SUSP
30.0000 mL | Freq: Four times a day (QID) | ORAL | Status: DC | PRN
Start: 2023-10-08 — End: 2023-10-12
  Administered 2023-10-12: 30 mL via ORAL
  Filled 2023-10-08: qty 30

## 2023-10-08 MED ORDER — DIPHENHYDRAMINE HCL 12.5 MG/5ML PO ELIX: 12.5000 mg | ORAL_SOLUTION | Freq: Four times a day (QID) | ORAL | Status: DC | PRN

## 2023-10-08 MED ORDER — DONEPEZIL HCL 10 MG PO TABS
5.0000 mg | ORAL_TABLET | Freq: Every day | ORAL | Status: DC
  Administered 2023-10-08 – 2023-10-11 (×4): 5 mg via ORAL
  Filled 2023-10-08 (×4): qty 1

## 2023-10-08 MED ORDER — ONDANSETRON HCL 4 MG/2ML IJ SOLN
4.0000 mg | Freq: Four times a day (QID) | INTRAMUSCULAR | Status: DC | PRN
  Administered 2023-10-10: 4 mg via INTRAVENOUS
  Filled 2023-10-08: qty 2

## 2023-10-08 MED ORDER — LIDOCAINE 2% (20 MG/ML) 5 ML SYRINGE
INTRAMUSCULAR | Status: DC | PRN
  Administered 2023-10-08: 80 mg via INTRAVENOUS

## 2023-10-08 MED ORDER — BISACODYL 5 MG PO TBEC: 20.0000 mg | DELAYED_RELEASE_TABLET | Freq: Once | ORAL | Status: DC

## 2023-10-08 MED ORDER — FERROUS SULFATE 325 (65 FE) MG PO TABS: 325.0000 mg | ORAL_TABLET | ORAL | Status: DC

## 2023-10-08 MED ORDER — ALVIMOPAN 12 MG PO CAPS: 12.0000 mg | ORAL_CAPSULE | Freq: Two times a day (BID) | ORAL | Status: DC

## 2023-10-08 MED ORDER — IBUPROFEN 400 MG PO TABS
600.0000 mg | ORAL_TABLET | Freq: Four times a day (QID) | ORAL | Status: DC | PRN
  Administered 2023-10-08: 600 mg via ORAL
  Filled 2023-10-08: qty 1

## 2023-10-08 MED ORDER — ORAL CARE MOUTH RINSE: 15.0000 mL | Freq: Once | OROMUCOSAL | Status: AC

## 2023-10-08 MED ORDER — ACETAMINOPHEN 500 MG PO TABS
1000.0000 mg | ORAL_TABLET | Freq: Four times a day (QID) | ORAL | Status: DC
  Administered 2023-10-08 – 2023-10-12 (×13): 1000 mg via ORAL
  Filled 2023-10-08 (×13): qty 2

## 2023-10-08 MED ORDER — INFLUENZA VAC A&B SURF ANT ADJ 0.5 ML IM SUSY
0.5000 mL | PREFILLED_SYRINGE | INTRAMUSCULAR | Status: DC
  Filled 2023-10-08: qty 0.5

## 2023-10-08 MED ORDER — ACETAMINOPHEN 500 MG PO TABS
1000.0000 mg | ORAL_TABLET | ORAL | Status: AC
  Administered 2023-10-08: 1000 mg via ORAL

## 2023-10-08 MED ORDER — PHENYLEPHRINE 80 MCG/ML (10ML) SYRINGE FOR IV PUSH (FOR BLOOD PRESSURE SUPPORT)
PREFILLED_SYRINGE | INTRAVENOUS | Status: AC
  Filled 2023-10-08: qty 10

## 2023-10-08 MED ORDER — HEPARIN SODIUM (PORCINE) 5000 UNIT/ML IJ SOLN
5000.0000 [IU] | Freq: Once | INTRAMUSCULAR | Status: AC
  Administered 2023-10-08: 5000 [IU] via SUBCUTANEOUS
  Filled 2023-10-08: qty 1

## 2023-10-08 MED ORDER — TRAMADOL HCL 50 MG PO TABS
50.0000 mg | ORAL_TABLET | Freq: Four times a day (QID) | ORAL | Status: DC | PRN
  Administered 2023-10-08 – 2023-10-12 (×5): 50 mg via ORAL
  Filled 2023-10-08 (×6): qty 1

## 2023-10-08 MED ORDER — PROPOFOL 10 MG/ML IV BOLUS
INTRAVENOUS | Status: AC
  Filled 2023-10-08: qty 20

## 2023-10-08 MED ORDER — ENSURE PRE-SURGERY PO LIQD
592.0000 mL | Freq: Once | ORAL | Status: DC
  Filled 2023-10-08: qty 592

## 2023-10-08 MED ORDER — BUPIVACAINE-EPINEPHRINE (PF) 0.25% -1:200000 IJ SOLN
INTRAMUSCULAR | Status: DC | PRN
  Administered 2023-10-08: 50 mL

## 2023-10-08 MED ORDER — ENSURE PRE-SURGERY PO LIQD
296.0000 mL | Freq: Once | ORAL | Status: DC
  Filled 2023-10-08: qty 296

## 2023-10-08 MED ORDER — HYDROMORPHONE HCL 1 MG/ML IJ SOLN
0.5000 mg | INTRAMUSCULAR | Status: DC | PRN
  Administered 2023-10-10 – 2023-10-12 (×3): 0.5 mg via INTRAVENOUS
  Filled 2023-10-08 (×3): qty 0.5

## 2023-10-08 MED ORDER — BUPIVACAINE-EPINEPHRINE 0.25% -1:200000 IJ SOLN
INTRAMUSCULAR | Status: AC
  Filled 2023-10-08: qty 1

## 2023-10-08 MED ORDER — ALVIMOPAN 12 MG PO CAPS
12.0000 mg | ORAL_CAPSULE | ORAL | Status: AC
  Administered 2023-10-08: 12 mg via ORAL
  Filled 2023-10-08: qty 1

## 2023-10-08 MED ORDER — ALLOPURINOL 100 MG PO TABS
100.0000 mg | ORAL_TABLET | Freq: Every day | ORAL | Status: DC
  Administered 2023-10-09 – 2023-10-12 (×4): 100 mg via ORAL
  Filled 2023-10-08 (×4): qty 1

## 2023-10-08 MED ORDER — CHLORHEXIDINE GLUCONATE 0.12 % MT SOLN
15.0000 mL | Freq: Once | OROMUCOSAL | Status: AC
  Administered 2023-10-08: 15 mL via OROMUCOSAL

## 2023-10-08 MED ORDER — HYDROMORPHONE HCL 1 MG/ML IJ SOLN: 0.2500 mg | INTRAMUSCULAR | Status: DC | PRN

## 2023-10-08 MED ORDER — POTASSIUM CHLORIDE CRYS ER 20 MEQ PO TBCR
20.0000 meq | EXTENDED_RELEASE_TABLET | Freq: Every day | ORAL | Status: DC
  Administered 2023-10-09 – 2023-10-12 (×4): 20 meq via ORAL
  Filled 2023-10-08 (×4): qty 1

## 2023-10-08 MED ORDER — HEPARIN SODIUM (PORCINE) 5000 UNIT/ML IJ SOLN
5000.0000 [IU] | Freq: Three times a day (TID) | INTRAMUSCULAR | Status: DC
  Administered 2023-10-08 – 2023-10-09 (×2): 5000 [IU] via SUBCUTANEOUS
  Filled 2023-10-08 (×2): qty 1

## 2023-10-08 MED ORDER — DROPERIDOL 2.5 MG/ML IJ SOLN: 0.6250 mg | Freq: Once | INTRAMUSCULAR | Status: DC | PRN

## 2023-10-08 MED ORDER — ONDANSETRON HCL 4 MG/2ML IJ SOLN
INTRAMUSCULAR | Status: DC | PRN
  Administered 2023-10-08: 4 mg via INTRAVENOUS

## 2023-10-08 MED ORDER — PHENYLEPHRINE HCL (PRESSORS) 10 MG/ML IV SOLN
INTRAVENOUS | Status: AC
Start: 2023-10-08 — End: ?
  Filled 2023-10-08: qty 1

## 2023-10-08 MED ORDER — SUGAMMADEX SODIUM 200 MG/2ML IV SOLN
INTRAVENOUS | Status: DC | PRN
  Administered 2023-10-08: 200 mg via INTRAVENOUS

## 2023-10-08 MED ORDER — BUPIVACAINE LIPOSOME 1.3 % IJ SUSP: 20.0000 mL | Freq: Once | INTRAMUSCULAR | Status: DC

## 2023-10-08 MED ORDER — 0.9 % SODIUM CHLORIDE (POUR BTL) OPTIME
TOPICAL | Status: DC | PRN
  Administered 2023-10-08: 1000 mL

## 2023-10-08 MED ORDER — ENSURE SURGERY PO LIQD
237.0000 mL | Freq: Two times a day (BID) | ORAL | Status: DC
  Administered 2023-10-08 – 2023-10-12 (×6): 237 mL via ORAL

## 2023-10-08 MED ORDER — HYDRALAZINE HCL 20 MG/ML IJ SOLN: 5.0000 mg | Freq: Four times a day (QID) | INTRAMUSCULAR | Status: DC | PRN

## 2023-10-08 MED ORDER — ROCURONIUM BROMIDE 10 MG/ML (PF) SYRINGE
PREFILLED_SYRINGE | INTRAVENOUS | Status: DC | PRN
  Administered 2023-10-08: 75 mg via INTRAVENOUS
  Administered 2023-10-08: 25 mg via INTRAVENOUS

## 2023-10-08 SURGICAL SUPPLY — 71 items
APPLIER CLIP 5 13 M/L LIGAMAX5 (MISCELLANEOUS)
APPLIER CLIP ROT 10 11.4 M/L (STAPLE)
BAG COUNTER SPONGE SURGICOUNT (BAG) IMPLANT
BLADE EXTENDED COATED 6.5IN (ELECTRODE) IMPLANT
CABLE HIGH FREQUENCY MONO STRZ (ELECTRODE) IMPLANT
CATH MUSHROOM 30FR (CATHETERS) IMPLANT
CLIP APPLIE 5 13 M/L LIGAMAX5 (MISCELLANEOUS) IMPLANT
CLIP APPLIE ROT 10 11.4 M/L (STAPLE) IMPLANT
DEFOGGER SCOPE WARMER CLEARIFY (MISCELLANEOUS) IMPLANT
DERMABOND ADVANCED .7 DNX12 (GAUZE/BANDAGES/DRESSINGS) IMPLANT
DISSECTOR BLUNT TIP ENDO 5MM (MISCELLANEOUS) IMPLANT
DRAIN CHANNEL 19F RND (DRAIN) IMPLANT
DRAPE SURG IRRIG POUCH 19X23 (DRAPES) ×1 IMPLANT
DRSG OPSITE POSTOP 4X10 (GAUZE/BANDAGES/DRESSINGS) IMPLANT
DRSG OPSITE POSTOP 4X6 (GAUZE/BANDAGES/DRESSINGS) IMPLANT
DRSG OPSITE POSTOP 4X8 (GAUZE/BANDAGES/DRESSINGS) IMPLANT
ELECT REM PT RETURN 15FT ADLT (MISCELLANEOUS) ×1 IMPLANT
EVACUATOR SILICONE 100CC (DRAIN) IMPLANT
GAUZE SPONGE 4X4 12PLY STRL (GAUZE/BANDAGES/DRESSINGS) IMPLANT
GLOVE BIO SURGEON STRL SZ7.5 (GLOVE) ×2 IMPLANT
GLOVE INDICATOR 8.0 STRL GRN (GLOVE) ×2 IMPLANT
GOWN STRL REUS W/ TWL XL LVL3 (GOWN DISPOSABLE) ×4 IMPLANT
HOLDER FOLEY CATH W/STRAP (MISCELLANEOUS) ×1 IMPLANT
IRRIG SUCT STRYKERFLOW 2 WTIP (MISCELLANEOUS) ×1
IRRIGATION SUCT STRKRFLW 2 WTP (MISCELLANEOUS) ×1 IMPLANT
KIT TURNOVER KIT A (KITS) IMPLANT
LIGASURE IMPACT 36 18CM CVD LR (INSTRUMENTS) IMPLANT
NDL INSUFFLATION 14GA 120MM (NEEDLE) IMPLANT
NEEDLE INSUFFLATION 14GA 120MM (NEEDLE)
PACK COLON (CUSTOM PROCEDURE TRAY) ×1 IMPLANT
PAD POSITIONING PINK XL (MISCELLANEOUS) ×1 IMPLANT
PENCIL SMOKE EVACUATOR (MISCELLANEOUS) IMPLANT
RELOAD PROXIMATE 75MM BLUE (ENDOMECHANICALS) ×2
RELOAD STAPLE 75 3.8 BLU REG (ENDOMECHANICALS) IMPLANT
RETRACTOR WND ALEXIS 18 MED (MISCELLANEOUS) IMPLANT
RTRCTR WOUND ALEXIS 18CM MED (MISCELLANEOUS)
SCISSORS LAP 5X35 DISP (ENDOMECHANICALS) ×1 IMPLANT
SEALER TISSUE G2 STRG ARTC 35C (ENDOMECHANICALS) ×1 IMPLANT
SET TUBE SMOKE EVAC HIGH FLOW (TUBING) ×1 IMPLANT
SLEEVE ADV FIXATION 5X100MM (TROCAR) ×2 IMPLANT
SPIKE FLUID TRANSFER (MISCELLANEOUS) ×1 IMPLANT
SPONGE DRAIN TRACH 4X4 STRL 2S (GAUZE/BANDAGES/DRESSINGS) IMPLANT
STAPLER 90 3.5 STAND SLIM (STAPLE) ×1
STAPLER 90 3.5 STD SLIM (STAPLE) IMPLANT
STAPLER GUN LINEAR PROX 60 (STAPLE) IMPLANT
STAPLER PROXIMATE 75MM BLUE (STAPLE) IMPLANT
STAPLER SKIN PROX WIDE 3.9 (STAPLE) IMPLANT
SUT ETHILON 3 0 PS 1 (SUTURE) IMPLANT
SUT MNCRL AB 4-0 PS2 18 (SUTURE) IMPLANT
SUT PDS AB 1 CT1 27 (SUTURE) IMPLANT
SUT PDS AB 1 TP1 54 (SUTURE) IMPLANT
SUT PDS AB 1 TP1 96 (SUTURE) IMPLANT
SUT PROLENE 2 0 KS (SUTURE) ×1 IMPLANT
SUT PROLENE 2 0 SH DA (SUTURE) ×1 IMPLANT
SUT SILK 2 0 SH CR/8 (SUTURE) ×1 IMPLANT
SUT SILK 2-0 18XBRD TIE 12 (SUTURE) ×1 IMPLANT
SUT SILK 3 0 SH CR/8 (SUTURE) ×1 IMPLANT
SUT SILK 3-0 18XBRD TIE 12 (SUTURE) ×1 IMPLANT
SUT VIC AB 2-0 SH 27X BRD (SUTURE) IMPLANT
SUT VIC AB 3-0 SH 18 (SUTURE) IMPLANT
SUT VIC AB 3-0 SH 27X BRD (SUTURE) IMPLANT
SUT VICRYL 2 0 18 UND BR (SUTURE) ×1 IMPLANT
SYS LAPSCP GELPORT 120MM (MISCELLANEOUS)
SYS WOUND ALEXIS 18CM MED (MISCELLANEOUS) ×1
SYSTEM LAPSCP GELPORT 120MM (MISCELLANEOUS) IMPLANT
SYSTEM WOUND ALEXIS 18CM MED (MISCELLANEOUS) IMPLANT
TOWEL OR 17X26 10 PK STRL BLUE (TOWEL DISPOSABLE) IMPLANT
TOWEL OR NON WOVEN STRL DISP B (DISPOSABLE) ×1 IMPLANT
TRAY IRRIG W/60CC SYR STRL (SET/KITS/TRAYS/PACK) ×1 IMPLANT
TROCAR ADV FIXATION 5X100MM (TROCAR) ×1 IMPLANT
TROCAR BALLN 12MMX100 BLUNT (TROCAR) IMPLANT

## 2023-10-08 NOTE — Consult Note (Signed)
[facility] Initial Consultation Note  Anne Roach:[id number] DOB: 05/31/1933 DOA: 10/08/2023  PCP: Leilani Able, MD   Requesting Physician: Dr. Marin Olp  Reason for Consultation: Medical Management  HPI: Anne Roach is a 89 y.o. female with medical history significant for hypertension, chronic diastolic CHF, non-insulin-dependent type 2 diabetes, hypertension who underwent laparoscopic right hemicolectomy with Dr. 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  Perioperative course was uncomplicated, though the patient did experience some hypertension which has responded to IV labetalol.  Patient seen and examined in PACU this morning, she is resting comfortably and has no complaints.  Review of Systems: Please see HPI for pertinent positives and negatives. A complete 10 system review of systems are otherwise negative.  Past Medical History:  Diagnosis Date   A-fib [facility])    Acute on chronic diastolic CHF (congestive heart failure) (HCC) 03/27/2022   Anemia    Arthritis    Atrial fibrillation (HCC)    Atrial fibrillation with RVR (HCC) 03/20/2017   Cancer (HCC)    colon cancer   Chronic diastolic CHF (congestive heart failure) (HCC) 10/31/2020   DM (diabetes mellitus) (HCC)    HTN (hypertension)    Occasional tremors    on Inderal for this   Sepsis secondary to UTI (HCC) 07/11/2020   Sleep apnea    not used cpap in a long time per daughter   Tremor    Past Surgical History:  Procedure Laterality Date   APPENDECTOMY     BIOPSY  12/23/2022   Procedure: BIOPSY;  Surgeon: Kerin Salen, MD;  Location: [facility];  Service: Gastroenterology;;   BIOPSY  08/03/2023   Procedure: BIOPSY;  Surgeon: Kerin Salen, MD;  Location: [facility];  Service: Gastroenterology;;   CARPAL TUNNEL RELEASE     CATARACT EXTRACTION Bilateral    COLONOSCOPY WITH PROPOFOL N/A 08/03/2023   Procedure:  COLONOSCOPY WITH PROPOFOL;  Surgeon: Kerin Salen, MD;  Location: [facility];  Service: Gastroenterology;  Laterality: N/A;   ESOPHAGEAL DILATION  12/23/2022   Procedure: ESOPHAGEAL DILATION;  Surgeon: Kerin Salen, MD;  Location: [facility];  Service: Gastroenterology;;   ESOPHAGOGASTRODUODENOSCOPY (EGD) WITH PROPOFOL N/A 12/23/2022   Procedure: ESOPHAGOGASTRODUODENOSCOPY (EGD) WITH PROPOFOL W/ DILATION;  Surgeon: Kerin Salen, MD;  Location: [facility];  Service: Gastroenterology;  Laterality: N/A;  with dilation   HERNIA REPAIR     POLYPECTOMY  08/03/2023   Procedure: POLYPECTOMY;  Surgeon: Kerin Salen, MD;  Location: [facility];  Service: Gastroenterology;;   tarsal tunnel surgery       Social History:  reports that she has never smoked. She has never used smokeless tobacco. She reports that she does not drink alcohol and does not use drugs.  No Known Allergies  Family History  Problem Relation Age of Onset   Diabetes Father    Asthma Brother    Cancer Daughter 36       BREAST CANCER     Prior to Admission medications   Medication Sig Start Date End Date Taking? Authorizing Provider  acetaminophen (TYLENOL) 500 MG tablet Take 1,000 mg by mouth every 6 (six) hours as needed for mild pain or headache.   Yes [provider]  allopurinol (ZYLOPRIM) 100 MG tablet TAKE 1 TABLET(100 MG) BY MOUTH DAILY 08/09/21  Yes Felecia Shelling, DPM  Cholecalciferol (VITAMIN D) 125 MCG (5000 UT) CAPS Take 5,000 Units by mouth daily with breakfast.   Yes  [provider]  donepezil (ARICEPT) 5 MG tablet Take 5 mg by mouth at bedtime.   Yes [provider]  ELIQUIS 5 MG TABS tablet TAKE 1 TABLET(5 MG) BY MOUTH TWICE DAILY 08/24/23  Yes Wendall Stade, MD  empagliflozin (JARDIANCE) 10 MG TABS tablet Take 1 tablet (10 mg total) by mouth daily. 03/27/23  Yes Wendall Stade, MD  ferrous sulfate 325 (65 FE) MG EC tablet Take 325 mg by mouth every other day.   Yes [provider]  folic acid (FOLVITE) 800 MCG tablet Take 800 mcg by mouth daily.   Yes [provider]  furosemide (LASIX) 40 MG tablet TAKE 1 TABLET(40 MG) BY MOUTH DAILY 05/26/23  Yes Wendall Stade, MD  gabapentin (NEURONTIN) 400 MG capsule Take 400 mg by mouth 3 (three) times daily. 03/25/22  Yes [provider]  Iron-FA-B Cmp-C-Biot-Probiotic (FUSION PLUS) CAPS Take 1 capsule by mouth every other day. 08/26/23  Yes [provider]  loratadine (CLARITIN) 10 MG tablet Take 10 mg by mouth daily as needed for allergies. 08/28/23  Yes [provider]  metoprolol succinate (TOPROL-XL) 50 MG 24 hr tablet Take 1 tablet (50 mg total) by mouth daily. Take with or immediately following a meal. 04/29/23  Yes Wendall Stade, MD  Multiple Vitamins-Minerals (ICAPS AREDS 2 PO) Take 1 capsule by mouth 2 (two) times daily.   Yes [provider]  Potassium Chloride ER 20 MEQ TBCR Take 20 mEq by mouth daily. 04/09/22  Yes Clegg, Amy D, NP  propranolol (INDERAL) 20 MG tablet Take 20 mg by mouth 2 (two) times daily. 09/22/23  Yes [provider]  traMADol (ULTRAM) 50 MG tablet Take 1 tablet (50 mg total) by mouth every 12 (twelve) hours as needed. Patient taking differently: Take 50 mg by mouth every 12 (twelve) hours as needed for moderate pain (pain score 4-6). 04/02/22  Yes Zannie Cove, MD  ACCU-CHEK AVIVA PLUS test strip  10/06/20   [provider]  fluticasone (FLONASE) 50 MCG/ACT nasal spray Place 1 spray into both nostrils daily. Patient not taking: Reported on 09/25/2023 07/02/23   Gwyneth Sprout, MD  lidocaine (XYLOCAINE) 2 % solution Use as directed 10 mLs in the mouth or throat every 6 (six) hours as needed (stomach pain). Patient not taking: Reported on 07/29/2023 11/27/22   Nira Conn, MD    Physical Exam: BP (!) 148/86   Pulse 97   Temp 97.7 F (36.5 C)   Resp 14   Ht 5\' 5"  (1.651 m)   Wt 70.3 kg   SpO2 100%   BMI 25.79  kg/m   General: Sleepy but arousable and alert, oriented, calm, in no acute distress, elderly female appearing her stated age Cardiovascular: RRR, no murmurs or rubs, no peripheral edema  Respiratory: clear to auscultation bilaterally on anterior exam, no wheezes, no crackles  Skin: dry, no rashes  Musculoskeletal: no joint effusions, normal range of motion  Psychiatric: appropriate affect, normal speech  Neurologic: extraocular muscles intact, clear speech, moving all extremities with intact sensorium         Recent Labs and Imaging Reviewed:  Basic Metabolic Panel: No results for input(s): "NA", "K", "CL", "CO2", "GLUCOSE", "BUN", "CREATININE", "CALCIUM", "MG", "PHOS" in the last 168 hours. Liver Function Tests: No results for input(s): "AST", "ALT", "ALKPHOS", "BILITOT", "PROT", "ALBUMIN" in the last 168 hours. No results for input(s): "LIPASE", "AMYLASE" in the last 168 hours. No results for input(s): "AMMONIA" in  the last 168 hours. CBC: No results for input(s): "WBC", "NEUTROABS", "HGB", "HCT", "MCV", "PLT" in the last 168 hours. Cardiac Enzymes: No results for input(s): "CKTOTAL", "CKMB", "CKMBINDEX", "TROPONINI" in the last 168 hours.  BNP (last 3 results) No results for input(s): "BNP" in the last 8760 hours.  ProBNP (last 3 results) No results for input(s): "PROBNP" in the last 8760 hours.  CBG: Recent Labs  Lab 10/08/23 0547  GLUCAP 115*    Radiological Exams on Admission: No results found.  Summary and Recommendations: Anne Roach is a 89 y.o. female with medical history significant for hypertension, chronic diastolic CHF, non-insulin-dependent type 2 diabetes, hypertension who underwent laparoscopic right hemicolectomy with Dr. 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  Cecal colon cancer status post laparoscopic right hemicolectomy today 11/21. -Perioperative management including  pain/nausea control, diet, etc. per Dr. Cliffton Asters -Recommends holding Eliquis until 11/23  Hypertension-reportedly have some hypertension in the OR, responded well to IV labetalol -Continue home Toprol-XL -IV hydralazine as needed for SBP greater than 160  Atrial fibrillation-continue Toprol-XL, hold Eliquis per Dr. Cliffton Asters until at least 11/23  Chronic diastolic CHF-without evidence of acute exacerbation or fluid overload -Continue beta-blocker, and Lasix daily  Type 2 diabetes-diet will be advanced slowly per surgery, recommend carb controlled diet.  Hold Jardiance for now at least until has good p.o. intake.  Dementia-Aricept daily  Thank you for involving [place] in the care of your patient.  [facility] will continue to follow along with you.  Time spent: 59 minutes  Elenore Wanninger Sharlette Dense MD [facility] Pager [phone]  If 7PM-7AM, please contact night-coverage [email or web address] Password [facility]  10/08/2023, 11:04 AM

## 2023-10-08 NOTE — Transfer of Care (Signed)
Immediate Anesthesia Transfer of Care Note  Patient: Anne Roach  Procedure(s) Performed: LAPAROSCOPIC RIGHT HEMICOLECTOMY (Right)  Patient Location: PACU  Anesthesia Type:General  Level of Consciousness: awake, alert , oriented, and patient cooperative  Airway & Oxygen Therapy: Patient Spontanous Breathing and Patient connected to face mask oxygen  Post-op Assessment: Report given to RN and Post -op Vital signs reviewed and stable  Post vital signs: Reviewed and stable  Last Vitals:  Vitals Value Taken Time  BP 173/89 10/08/23 0949  Temp    Pulse 96 10/08/23 0952  Resp 14 10/08/23 0952  SpO2 100 % 10/08/23 0952  Vitals shown include unfiled device data.  Last Pain:  Vitals:   10/08/23 0654  TempSrc:   PainSc: 10-Worst pain ever         Complications: No notable events documented.

## 2023-10-08 NOTE — Anesthesia Procedure Notes (Signed)
Procedure Name: Intubation Date/Time: 10/08/2023 7:42 AM  Performed by: Ponciano Ort, CRNAPre-anesthesia Checklist: Patient identified, Emergency Drugs available, Suction available and Patient being monitored Patient Re-evaluated:Patient Re-evaluated prior to induction Oxygen Delivery Method: Circle system utilized Preoxygenation: Pre-oxygenation with 100% oxygen Induction Type: IV induction Ventilation: Mask ventilation without difficulty and Oral airway inserted - appropriate to patient size Laryngoscope Size: Mac and 3 Grade View: Grade II Tube type: Oral Tube size: 7.0 mm Number of attempts: 1 Airway Equipment and Method: Stylet and Oral airway Placement Confirmation: ETT inserted through vocal cords under direct vision, positive ETCO2 and breath sounds checked- equal and bilateral Secured at: 21 cm Tube secured with: Tape Dental Injury: Teeth and Oropharynx as per pre-operative assessment

## 2023-10-08 NOTE — Op Note (Signed)
PATIENT: Anne Roach  89 y.o. female  Patient Care Team: Leilani Able, MD as PCP - General (Family Medicine) Wendall Stade, MD as PCP - Cardiology (Cardiology)  PREOP DIAGNOSIS: Cecal colon cancer  POSTOP DIAGNOSIS: Same  PROCEDURE:  Laparoscopic right hemicolectomy with lysis of adhesions x 25 minutes Partial omentectomy  SURGEON: Stephanie Coup. Niaomi Cartaya, MD  ASSISTANT: Romie Levee, MD  ANESTHESIA: General endotracheal  EBL: 50 mL Total I/O In: 1100 [I.V.:1000; IV Piggyback:100] Out: 250 [Urine:200; Blood:50]  DRAINS: None  SPECIMEN: Right colon (terminal ileum, cecum, ascending colon, proximal transverse colon en bloc) with omentum  COUNTS: Sponge, needle and instrument counts were reported correct x2  FINDINGS:  Normal liver surface.  Normal peritoneal surfaces.  Evident mass in the cecum with ileocolic adenopathy.  Omental containing adhesions across the upper midline likely related to prior surgery.  Laparoscopic right hemicolectomy was carried out uneventfully.  Partial omentectomy also necessary as there were multiple tongues of omentum which were quite oozy from the adhesiolysis and this was necessary for hemostasis purposes.  NARRATIVE:  The patient was identified & brought into the operating room, placed supine on the operating table and SCDs were applied to the lower extremities. General endotracheal anesthesia was induced. The patient was positioned supine with arms tucked. Antibiotics were administered. A foley catheter was placed under sterile conditions. Hair in the region of planned surgery was clipped. The abdomen was prepped and draped in a sterile fashion. A timeout was performed confirming our patient and plan.   Beginning with the extraction port, a infraumbilical incision was made and carried down to the midline fascia. This was then incised with electrocautery. The peritoneum was identified and elevated between clamps and carefully opened sharply. A  small Alexis wound protector with a cap and associated port was then placed. The abdomen was insufflated to 15 mmHg with CO2. A laparoscope was placed and camera inspection revealed no evidence of injury. Bilateral TAP blocks were then performed under laparoscopic visualization using a mixture of 0.25% marcaine with epinepherine + Exparel. 3 additional ports were then placed under direct laparoscopic visualization - two in the left hemiabdomen and one in the right abdomen. The abdomen was surveyed. The liver and peritoneum appeared normal.  There were no signs of metastatic disease.  Omental containing lesions are noted across the upper midline portion of the abdomen.  These were carefully cleared using our laparoscopic Enseal device.  Multiple points that were fairly oozy were attempted to be controlled with this Enseal device.  No other adhesions were noted.  Total time with adhesiolysis is approximately 25 minutes.  She was positioned in trendelenburg with left side down.  She had some scar tissue in her right lower quadrant between the cecum and the associated mesocolon.  For these reasons, we opted to proceed with a lateral to medial type approach.  The cecum has an evident mass within it.  The terminal ileum was elevated.  Were able to free adhesions in the right lower quadrant from the terminal ileum and in doing so have fully mobilized the terminal ileum.  The cecum is then carefully retracted medially.  The Clotiel Troop line of Toldt was incised all the way up to the hepatic flexure.  The associated mesocolon was reflected medially.  She is then repositioned in reverse Trendelenburg.  The transverse colon is retracted caudad and omentum is elevated anteriorly.  The lesser sac is able to be opened.  We are then able to fully mobilize the hepatic  flexure from this approach by taking down the right upper quadrant adhesions along the Jakala Herford line of Toldt.  The duodenum is identified and protected.  We are able to  fully mobilize the hepatic flexure.  Given her infraumbilical incision and her relatively redundant colon, we have more than adequate reach to her midline wound including the root of the mesentery.  .   At this point, the abdomen was desufflated and the terminal ileum and right colon delivered through the wound protector.  The ileocolic pedicle was identified.  There is palpable adenopathy within this.  We went down to the base of the ileocolic pedicle.  The duodenum is identified and able to be freed medial to this.  The peritoneum overlying ileocolic pedicle was identified and incised.  The ileocolic pedicle was circumferentially dissected.  The duodenum was reidentified and well away from this.  This is then ligated and divided using the laparoscopic Enseal device.  The pedicles inspected and noted to be hemostatic.   A window was created the mesentery of the distal/terminal ileum.  The terminal ileum was then transected using a GIA blue load stapler. The distal point of transection was then identified on the transverse colon at a location that included the right branch of the middle colics, leaving the main middle colic feeding the remaining transverse colon. This was transected using another blue load GIA stapler.  The intervening mesentery was then ligated and divided using the laparoscopic Enseal device.  The divided mesentery was inspected and noted to be hemostatic.   The specimen was then passed off.  Of note, the appendix is surgically absent.  The omentum was then evaluated.  There is still some areas of venous oozing where she had had the adhesiolysis performed.  For these reasons, we opted to resect this entire segment of omentum in a partial omentectomy manner.  The cut edge of the omentum was inspected noted to be hemostatic.  The omentum was passed off with the original specimen.  Attention was turned to creating the anastomosis. The distal ileum and transverse colon were inspected for  orientation to ensure no twisting nor bowel included in the mesenteric defect. An anastomosis was created between the distal ileum and the transverse colon using a 75 mm GIA blue load stapler. The staple line was inspected and noted to be hemostatic.  The common enterotomy channel was closed using a TA 90 blue load stapler. Hemostasis was achieved at the staple line using 3-0 silk U-stitches. 3-0 silk sutures were used to imbricate the corners of the staple line as well.  A 2-0 silk suture was placed securing the "crotch" of the anastomosis. The anastomosis was palpated and noted to be widely patent. This was then placed back into the abdomen. The abdomen was then irrigated with sterile saline and hemostasis verified. The omentum was then brought down over the anastomosis. The wound protector cap was replaced and CO2 reinsufflated. The laparoscopic ports were removed under direct visualization and the sites noted to be hemostatic. The Alexis wound protector was removed, counts were reported correct, and we switched to clean instruments, gowns and drapes.  The fascia was then closed using two running #1 PDS sutures.  The skin of all incision sites was closed with 4-0 monocryl subcuticular suture. Dermabond was placed on the port sites and a sterile dressing was placed over the abdominal incision. All counts were reported correct. She was then awakened from anesthesia and sent to the post anesthesia care unit in stable  condition.   DISPOSITION: PACU in satisfactory condition

## 2023-10-08 NOTE — Anesthesia Postprocedure Evaluation (Signed)
Anesthesia Post Note  Patient: Anne Roach  Procedure(s) Performed: LAPAROSCOPIC RIGHT HEMICOLECTOMY (Right)     Patient location during evaluation: PACU Anesthesia Type: General Level of consciousness: sedated and patient cooperative Pain management: pain level controlled Vital Signs Assessment: post-procedure vital signs reviewed and stable Respiratory status: spontaneous breathing Cardiovascular status: stable Anesthetic complications: no   No notable events documented.  Last Vitals:  Vitals:   10/08/23 1030 10/08/23 1045  BP: (!) 151/84 (!) 148/86  Pulse: 97 97  Resp: 14 14  Temp:  36.5 C  SpO2: 100% 100%    Last Pain:  Vitals:   10/08/23 1045  TempSrc:   PainSc: 0-No pain                 Lewie Loron

## 2023-10-09 ENCOUNTER — Other Ambulatory Visit (HOSPITAL_COMMUNITY): Payer: Self-pay

## 2023-10-09 ENCOUNTER — Encounter (HOSPITAL_COMMUNITY): Payer: Self-pay | Admitting: Surgery

## 2023-10-09 DIAGNOSIS — D62 Acute posthemorrhagic anemia: Secondary | ICD-10-CM | POA: Insufficient documentation

## 2023-10-09 DIAGNOSIS — Z9049 Acquired absence of other specified parts of digestive tract: Secondary | ICD-10-CM | POA: Diagnosis not present

## 2023-10-09 DIAGNOSIS — I10 Essential (primary) hypertension: Secondary | ICD-10-CM | POA: Diagnosis not present

## 2023-10-09 DIAGNOSIS — E1159 Type 2 diabetes mellitus with other circulatory complications: Secondary | ICD-10-CM

## 2023-10-09 DIAGNOSIS — I482 Chronic atrial fibrillation, unspecified: Secondary | ICD-10-CM

## 2023-10-09 DIAGNOSIS — I5032 Chronic diastolic (congestive) heart failure: Secondary | ICD-10-CM | POA: Diagnosis not present

## 2023-10-09 LAB — CBC WITH DIFFERENTIAL/PLATELET
Abs Immature Granulocytes: 0.06 10*3/uL (ref 0.00–0.07)
Basophils Absolute: 0 10*3/uL (ref 0.0–0.1)
Basophils Relative: 0 %
Eosinophils Absolute: 0 10*3/uL (ref 0.0–0.5)
Eosinophils Relative: 0 %
HCT: 23.2 % — ABNORMAL LOW (ref 36.0–46.0)
Hemoglobin: 7.3 g/dL — ABNORMAL LOW (ref 12.0–15.0)
Immature Granulocytes: 1 %
Lymphocytes Relative: 20 %
Lymphs Abs: 2.1 10*3/uL (ref 0.7–4.0)
MCH: 31.1 pg (ref 26.0–34.0)
MCHC: 31.5 g/dL (ref 30.0–36.0)
MCV: 98.7 fL (ref 80.0–100.0)
Monocytes Absolute: 1.4 10*3/uL — ABNORMAL HIGH (ref 0.1–1.0)
Monocytes Relative: 13 %
Neutro Abs: 7.2 10*3/uL (ref 1.7–7.7)
Neutrophils Relative %: 66 %
Platelets: 195 10*3/uL (ref 150–400)
RBC: 2.35 MIL/uL — ABNORMAL LOW (ref 3.87–5.11)
RDW: 14.4 % (ref 11.5–15.5)
WBC: 10.8 10*3/uL — ABNORMAL HIGH (ref 4.0–10.5)
nRBC: 0 % (ref 0.0–0.2)

## 2023-10-09 LAB — BASIC METABOLIC PANEL
Anion gap: 9 (ref 5–15)
BUN: 19 mg/dL (ref 8–23)
CO2: 23 mmol/L (ref 22–32)
Calcium: 8.5 mg/dL — ABNORMAL LOW (ref 8.9–10.3)
Chloride: 106 mmol/L (ref 98–111)
Creatinine, Ser: 1.37 mg/dL — ABNORMAL HIGH (ref 0.44–1.00)
GFR, Estimated: 37 mL/min — ABNORMAL LOW (ref >60)
Glucose, Bld: 114 mg/dL — ABNORMAL HIGH (ref 70–99)
Potassium: 4.3 mmol/L (ref 3.5–5.1)
Sodium: 138 mmol/L (ref 135–145)

## 2023-10-09 LAB — CBC
HCT: 25 % — ABNORMAL LOW (ref 36.0–46.0)
Hemoglobin: 7.4 g/dL — ABNORMAL LOW (ref 12.0–15.0)
MCH: 29.6 pg (ref 26.0–34.0)
MCHC: 29.6 g/dL — ABNORMAL LOW (ref 30.0–36.0)
MCV: 100 fL (ref 80.0–100.0)
Platelets: 194 10*3/uL (ref 150–400)
RBC: 2.5 MIL/uL — ABNORMAL LOW (ref 3.87–5.11)
RDW: 14.5 % (ref 11.5–15.5)
WBC: 10.1 10*3/uL (ref 4.0–10.5)
nRBC: 0 % (ref 0.0–0.2)

## 2023-10-09 LAB — IRON AND TIBC
Iron: 59 ug/dL (ref 28–170)
Saturation Ratios: 26 % (ref 10.4–31.8)
TIBC: 230 ug/dL — ABNORMAL LOW (ref 250–450)
UIBC: 171 ug/dL

## 2023-10-09 LAB — PREPARE RBC (CROSSMATCH)

## 2023-10-09 LAB — VITAMIN B12: Vitamin B-12: 181 pg/mL (ref 180–914)

## 2023-10-09 LAB — FERRITIN: Ferritin: 30 ng/mL (ref 11–307)

## 2023-10-09 LAB — FOLATE: Folate: >40 ng/mL (ref >5.9)

## 2023-10-09 MED ORDER — FUROSEMIDE 10 MG/ML IJ SOLN: 20.0000 mg | Freq: Once | INTRAMUSCULAR | Status: DC

## 2023-10-09 MED ORDER — SODIUM CHLORIDE 0.9 % IV SOLN: INTRAVENOUS | Status: AC

## 2023-10-09 MED ORDER — SODIUM CHLORIDE 0.9% IV SOLUTION: Freq: Once | INTRAVENOUS | Status: AC

## 2023-10-09 MED ORDER — VITAMIN C 500 MG PO TABS
500.0000 mg | ORAL_TABLET | Freq: Two times a day (BID) | ORAL | Status: DC
Start: 2023-10-09 — End: 2023-10-12
  Administered 2023-10-09 – 2023-10-12 (×7): 500 mg via ORAL
  Filled 2023-10-09 (×7): qty 1

## 2023-10-09 MED ORDER — TRAMADOL HCL 50 MG PO TABS
50.0000 mg | ORAL_TABLET | Freq: Four times a day (QID) | ORAL | 0 refills | Status: AC | PRN
  Filled 2023-10-09: qty 15, 4d supply, fill #0

## 2023-10-09 MED ORDER — ACETAMINOPHEN 325 MG PO TABS: 650.0000 mg | ORAL_TABLET | Freq: Once | ORAL | Status: DC

## 2023-10-09 MED ORDER — FERROUS SULFATE 325 (65 FE) MG PO TABS
325.0000 mg | ORAL_TABLET | Freq: Two times a day (BID) | ORAL | Status: DC
  Administered 2023-10-09 – 2023-10-12 (×7): 325 mg via ORAL
  Filled 2023-10-09 (×7): qty 1

## 2023-10-09 NOTE — Progress Notes (Signed)
Patients daughter hesitant to sign blood consent, stated that we have not given her enough time to rebound from her surgery, she asks if it is completely necessary or if we could wait one more day, relayed this message to MD, he said  that would be alright, to redraw CBC in am, and make the decision based on the results in the AM, daughter made aware, states " that makes me more comfortable, and that way if she needs the blood, I will be able to be there with her"  MD d/c'd the order at this time.

## 2023-10-09 NOTE — Evaluation (Signed)
Occupational Therapy Evaluation Patient Details Name: Anne Roach MRN: [id number] DOB: 07/29/1933 Today's Date: 10/09/2023   History of Present Illness Anne Roach is an 89 y.o. female s/p laparoscopic R hemicolectomy 11/21. PMH: afib, gout, CHF, colon cancer, HTN, diabetes.   Clinical Impression   Patient is currently requiring assistance with ADLs including no more than Minimal assist with Lower body ADLs, setup assist with Upper body ADLs,  as well as  CGA assist and cues with bed mobility and CGA assist with functional transfers to toilet.  Current level of function is below patient's typical baseline.    During this evaluation, patient was limited by generalized weakness, impaired activity tolerance, and chronic Bil knee pain with ambulation, all of which has the potential to impact patient's safety and independence during functional mobility, as well as performance for ADLs.  Patient lives at home, often alone as daughter work in [place] and commutes. She has a HHA 2.5 hours a day on daughter's days of work.   Patient demonstrates good rehab potential, and should benefit from continued skilled occupational therapy services while in acute care to maximize safety, independence and quality of life at home.  Continued occupational therapy services in the home are recommended.  ?        If plan is discharge home, recommend the following: A little help with bathing/dressing/bathroom;A little help with walking and/or transfers;Assistance with cooking/housework;Assist for transportation    Functional Status Assessment  Patient has had a recent decline in their functional status and demonstrates the ability to make significant improvements in function in a reasonable and predictable amount of time.  Equipment Recommendations  None recommended by OT    Recommendations for Other Services       Precautions / Restrictions Precautions Precautions: Fall Restrictions Weight  Bearing Restrictions: No      Mobility Bed Mobility Overal bed mobility: Needs Assistance Bed Mobility: Rolling, Sidelying to Sit Rolling: Contact guard assist Sidelying to sit: Contact guard assist       General bed mobility comments: cues for log rolling due to abdominal surgery to minimize pain and discomfort    Transfers                          Balance Overall balance assessment: Mild deficits observed, not formally tested, History of Falls                                         ADL either performed or assessed with clinical judgement   ADL Overall ADL's : Needs assistance/impaired Eating/Feeding: Independent   Grooming: Sitting;Set up   Upper Body Bathing: Set up;Sitting   Lower Body Bathing: Contact guard assist;Sitting/lateral leans;Sit to/from stand   Upper Body Dressing : Sitting;Set up   Lower Body Dressing: Sitting/lateral leans;Set up Lower Body Dressing Details (indicate cue type and reason): Able to doff and don socks. Toilet Transfer: Supervision/safety;Contact guard assist;Ambulation;Rolling walker (2 wheels) Toilet Transfer Details (indicate cue type and reason): Pt stood from EOB and from arm chair with supervision. Pt ambulating in hallway with RW and CGA.   Toileting - Clothing Manipulation Details (indicate cue type and reason): Able to simulate UE reach around to peri area whiel in recliner.     Functional mobility during ADLs: Supervision/safety;Contact guard assist;Set up;Rolling walker (2 wheels)       Vision Baseline Vision/History:  6 Macular Degeneration Ability to See in Adequate Light: 1 Impaired Patient Visual Report: No change from baseline Additional Comments: Pt and daughter given broad education on lighting and other safety considerations at home with visual impairments.     Perception         Praxis         Pertinent Vitals/Pain Pain Assessment Pain Assessment:  (Pt denied pain at rest. Has  chronic knee pain) Faces Pain Scale: Hurts a little bit Pain Location: bil knees Pain Descriptors / Indicators: Aching, Sore Pain Intervention(s): Limited activity within patient's tolerance, Monitored during session     Extremity/Trunk Assessment Upper Extremity Assessment Upper Extremity Assessment: Overall WFL for tasks assessed   Lower Extremity Assessment Lower Extremity Assessment: Overall WFL for tasks assessed   Cervical / Trunk Assessment Cervical / Trunk Assessment: Kyphotic   Communication Communication Communication: Other (comment) (Daughter prefers to interpret over Ipad)   Cognition Arousal: Alert Behavior During Therapy: WFL for tasks assessed/performed Overall Cognitive Status: Within Functional Limits for tasks assessed                                 General Comments: Per daughter, pt understands a good amount of English, but speak slowly and clearly     General Comments       Exercises     Shoulder Instructions      Home Living Family/patient expects to be discharged to:: Private residence Living Arrangements: Children Available Help at Discharge: Family;Available PRN/intermittently;Personal care attendant Type of Home: House Home Access: Level entry;Ramped entrance     Home Layout: Multi-level;Able to live on main level with bedroom/bathroom Alternate Level Stairs-Number of Steps: 13 Alternate Level Stairs-Rails: Right Bathroom Shower/Tub: Producer, television/film/video: Handicapped height Bathroom Accessibility: Yes How Accessible: Accessible via walker Home Equipment: Rolling Walker (2 wheels);Grab bars - tub/shower;Shower seat - built in;BSC/3in1;Rollator (4 wheels);Hand held shower head;Other (comment);Electric scooter;Transport chair;Lift chair   Additional Comments: Life Alert (but doesn't use it), HHA M, Tu, Th, Fr, and every other Sat for about 2.5 hours/day. Daughter reports pt has adjustable bed but prefers flat bed.   Dtr home with pt on Wednesdays.      Prior Functioning/Environment Prior Level of Function : Needs assist             Mobility Comments: daughter reports pt amb with rollator around the home and in community; pt declines w/c or scooter in community and prefers to walk; recently 1 fall last week in bathroom, able to get up independently and told daughter later ADLs Comments: daughter reports supv/A as needed with bathing, pt performs toileting and dressing; aide assists with bathing and household chores        OT Problem List: Decreased activity tolerance;Decreased knowledge of use of DME or AE;Pain;Impaired vision/perception;Cardiopulmonary status limiting activity      OT Treatment/Interventions: Self-care/ADL training;Therapeutic activities;Visual/perceptual remediation/compensation;Energy conservation;Patient/family education;Balance training;DME and/or AE instruction    OT Goals(Current goals can be found in the care plan section) Acute Rehab OT Goals Patient Stated Goal: Return home OT Goal Formulation: With patient/family Time For Goal Achievement: 10/23/23 Potential to Achieve Goals: Good ADL Goals Pt Will Perform Grooming: standing;with modified independence Pt Will Perform Lower Body Dressing: with modified independence;sit to/from stand;sitting/lateral leans Pt Will Transfer to Toilet: ambulating;with modified independence Pt Will Perform Toileting - Clothing Manipulation and hygiene: with modified independence;sitting/lateral leans;sit to/from stand Additional ADL Goal #1:  Pt/daughter will identiy at least 3 fall prevention strategies, specifically related to vision deficits with macular degeneration.  OT Frequency: Min 1X/week    Co-evaluation PT/OT/SLP Co-Evaluation/Treatment: Yes Reason for Co-Treatment: To address functional/ADL transfers PT goals addressed during session: Mobility/safety with mobility;Balance;Proper use of DME OT goals addressed during session:  ADL's and self-care      AM-PAC OT "6 Clicks" Daily Activity     Outcome Measure Help from another person eating meals?: None Help from another person taking care of personal grooming?: A Little Help from another person toileting, which includes using toliet, bedpan, or urinal?: A Little Help from another person bathing (including washing, rinsing, drying)?: A Little Help from another person to put on and taking off regular upper body clothing?: A Little Help from another person to put on and taking off regular lower body clothing?: A Little 6 Click Score: 19   End of Session Equipment Utilized During Treatment: Rolling walker (2 wheels) Nurse Communication: Mobility status  Activity Tolerance: Patient limited by pain Patient left: in chair;with call bell/phone within reach;with family/visitor present  OT Visit Diagnosis: Unsteadiness on feet (R26.81);Pain Pain - Right/Left:  (bil) Pain - part of body: Knee                Time: 1610-9604 OT Time Calculation (min): 34 min Charges:  OT General Charges $OT Visit: 1 Visit OT Evaluation $OT Eval Low Complexity: 1 Low  Oneisha Ammons, OT Acute Rehab Services Office: [phone] 10/09/2023   Theodoro Clock 10/09/2023, 12:21 PM

## 2023-10-09 NOTE — Evaluation (Signed)
Physical Therapy Evaluation Patient Details Name: Anne Roach MRN: [id number] DOB: 10/20/1933 Today's Date: 10/09/2023  History of Present Illness  Anne Roach is an 89 y.o. female s/p laparoscopic R hemicolectomy 11/21. PMH: afib, gout, CHF, colon cancer, HTN, diabetes.  Clinical Impression  Pt admitted with above diagnosis. Pt from home with daughter, using rollator walker in the home and community, declines to use w/c or scooter in community, daughter and HHA assist with showers, pt performs toileting and dressing. Daughter and pt endorse fall last week, pt reports dizziness in restroom causing her to fall, able to get up independently and told daughter later. Pt amb 180 ft x2 with seated rest break and CGA, limited by bil chronic knee pain complaints, 1 minor LOB with head turn/distraction in hallway but able to recover ind with close supv. Recommend HHPT, continued HHA and family support at d/c. Pt currently with functional limitations due to the deficits listed below (see PT Problem List). Pt will benefit from acute skilled PT to increase their independence and safety with mobility to allow discharge.  ,ed         If plan is discharge home, recommend the following: A little help with walking and/or transfers;A little help with bathing/dressing/bathroom;Assistance with cooking/housework;Assist for transportation;Help with stairs or ramp for entrance   Can travel by private vehicle        Equipment Recommendations None recommended by PT  Recommendations for Other Services       Functional Status Assessment Patient has had a recent decline in their functional status and demonstrates the ability to make significant improvements in function in a reasonable and predictable amount of time.     Precautions / Restrictions Precautions Precautions: Fall Restrictions Weight Bearing Restrictions: No      Mobility  Bed Mobility Overal bed mobility: Needs Assistance Bed  Mobility: Rolling, Sidelying to Sit Rolling: Contact guard assist Sidelying to sit: Contact guard assist       General bed mobility comments: cues for log rolling due to abdominal surgery to minimize pain and discomfort    Transfers Overall transfer level: Needs assistance Equipment used: None Transfers: Sit to/from Stand Sit to Stand: Supervision           General transfer comment: pt powers to stand without AD, slightly impulsive wanting to fix hospital gowns, good steadiness without LOB    Ambulation/Gait Ambulation/Gait assistance: Contact guard assist Gait Distance (Feet): 180 Feet (x2) Assistive device: Rolling walker (2 wheels) Gait Pattern/deviations: Step-through pattern, Decreased stride length, Trunk flexed Gait velocity: decreased     General Gait Details: trunk slightly flexed, step through gait pattern, 1 seated rest break between due to bil knee pain, pt also with 1 minor LOB with head turn/distraction in hallway and able to recover independently with close supv and continue ambulation  Stairs            Wheelchair Mobility     Tilt Bed    Modified Rankin (Stroke Patients Only)       Balance Overall balance assessment: Mild deficits observed, not formally tested, History of Falls                                           Pertinent Vitals/Pain Pain Assessment Pain Assessment: Faces Faces Pain Scale: Hurts a little bit Pain Location: bil knees Pain Descriptors / Indicators: Aching, Sore Pain Intervention(s):  Limited activity within patient's tolerance, Monitored during session    Home Living Family/patient expects to be discharged to:: Private residence Living Arrangements: Children Available Help at Discharge: Family;Available PRN/intermittently;Personal care attendant Type of Home: House Home Access: Level entry;Ramped entrance     Alternate Level Stairs-Number of Steps: 13 Home Layout: Multi-level;Able to live on  main level with bedroom/bathroom Home Equipment: Rolling Walker (2 wheels);Grab bars - tub/shower;Shower seat - built in;BSC/3in1;Rollator (4 wheels);Hand held shower head;Other (comment);Electric scooter;Transport chair;Lift chair Additional Comments: Life Alert (but doesn't use it), HHA M, Tu, Th, Fr, and everyother Sat for about 2 hours/day. Daughter reports pt has adjustable bed but prefers flat bed    Prior Function Prior Level of Function : Needs assist             Mobility Comments: daughter reports pt amb with rollator around the home and in community; pt declines w/c or scooter in community and prefers to walk; recently 1 fall last week in bathroom, able to get up independently and told daughter later ADLs Comments: daughter reports supv/A as needed with bathing, pt performs toileting and dressing; aide assists with bathing and household chores     Extremity/Trunk Assessment   Upper Extremity Assessment Upper Extremity Assessment: Defer to OT evaluation    Lower Extremity Assessment Lower Extremity Assessment: Overall WFL for tasks assessed (AROM WFL, strength grossly 4/5, symmetrical)    Cervical / Trunk Assessment Cervical / Trunk Assessment: Kyphotic  Communication   Communication Communication: Other (comment) (Daughter prefers to interpret over Ipad)  Cognition Arousal: Alert Behavior During Therapy: WFL for tasks assessed/performed Overall Cognitive Status: Within Functional Limits for tasks assessed                                 General Comments: Per daughter, pt understands a good amount of English, but speak slowly and clearly        General Comments      Exercises     Assessment/Plan    PT Assessment Patient needs continued PT services  PT Problem List Decreased activity tolerance;Decreased balance;Decreased mobility;Decreased knowledge of use of DME;Decreased safety awareness;Decreased knowledge of precautions;Pain       PT  Treatment Interventions DME instruction;Gait training;Stair training;Functional mobility training;Therapeutic activities;Therapeutic exercise;Balance training;Neuromuscular re-education;Patient/family education;Modalities    PT Goals (Current goals can be found in the Care Plan section)  Acute Rehab PT Goals Patient Stated Goal: return home, agreeable to [facility] therapy PT Goal Formulation: With patient/family Time For Goal Achievement: 10/23/23 Potential to Achieve Goals: Good    Frequency Min 1X/week     Co-evaluation PT/OT/SLP Co-Evaluation/Treatment: Yes Reason for Co-Treatment: To address functional/ADL transfers PT goals addressed during session: Mobility/safety with mobility;Balance;Proper use of DME         AM-PAC PT "6 Clicks" Mobility  Outcome Measure Help needed turning from your back to your side while in a flat bed without using bedrails?: A Little Help needed moving from lying on your back to sitting on the side of a flat bed without using bedrails?: A Little Help needed moving to and from a bed to a chair (including a wheelchair)?: A Little Help needed standing up from a chair using your arms (e.g., wheelchair or bedside chair)?: A Little Help needed to walk in hospital room?: A Little Help needed climbing 3-5 steps with a railing? : A Little 6 Click Score: 18    End of Session  Activity Tolerance: Patient tolerated treatment well Patient left: in chair;with call bell/phone within reach;with family/visitor present Nurse Communication: Mobility status PT Visit Diagnosis: Other abnormalities of gait and mobility (R26.89);Pain Pain - Right/Left:  (bil) Pain - part of body: Knee    Time: 9562-1308 PT Time Calculation (min) (ACUTE ONLY): 34 min   Charges:   PT Evaluation $PT Eval Low Complexity: 1 Low   PT General Charges $$ ACUTE PT VISIT: 1 Visit          Tori Miracle Criado PT, DPT 10/09/23, 9:43 AM

## 2023-10-09 NOTE — Discharge Instructions (Signed)
POST OP INSTRUCTIONS AFTER COLON SURGERY  DIET: Be sure to include lots of fluids daily to stay hydrated - 64oz of water per day (8, 8 oz glasses).  Avoid fast food or heavy meals for the first couple of weeks as your are more likely to get nauseated. Avoid raw/uncooked fruits or vegetables for the first 4 weeks (its ok to have these if they are blended into smoothie form). If you have fruits/vegetables, make sure they are cooked until soft enough to mash on the roof of your mouth and chew your food well. Otherwise, diet as tolerated.  Take your usually prescribed home medications unless otherwise directed.  PAIN CONTROL: Pain is best controlled by a usual combination of three different methods TOGETHER: Ice/Heat Over the counter pain medication Prescription pain medication Most patients will experience some swelling and bruising around the surgical site.  Ice packs or heating pads (30-60 minutes up to 6 times a day) will help. Some people prefer to use ice alone, heat alone, alternating between ice & heat.  Experiment to what works for you.  Swelling and bruising can take several weeks to resolve.   It is helpful to take an over-the-counter pain medication regularly for the first few weeks: Ibuprofen (Motrin/Advil) - 200mg tabs - take 3 tabs (600mg) every 6 hours as needed for pain (unless you have been directed previously to avoid NSAIDs/ibuprofen) Acetaminophen (Tylenol) - you may take 650mg every 6 hours as needed. You can take this with motrin as they act differently on the body. If you are taking a narcotic pain medication that has acetaminophen in it, do not take over the counter tylenol at the same time. NOTE: You may take both of these medications together - most patients  find it most helpful when alternating between the two (i.e. Ibuprofen at 6am, tylenol at 9am, ibuprofen at 12pm ...) A  prescription for pain medication should be given to you upon discharge.  Take your pain medication as  prescribed if your pain is not adequatly controlled with the over-the-counter pain reliefs mentioned above.  Avoid getting constipated.  Between the surgery and the pain medications, it is common to experience some constipation.  Increasing fluid intake and taking a fiber supplement (such as Metamucil, Citrucel, FiberCon, MiraLax, etc) 1-2 times a day regularly will usually help prevent this problem from occurring.  A mild laxative (prune juice, Milk of Magnesia, MiraLax, etc) should be taken according to package directions if there are no bowel movements after 48 hours.    Dressing: Your incisions are covered in Dermabond which is like sterile superglue for the skin. This will come off on it's own in a couple weeks. It is waterproof and you may bathe normally starting the day after your surgery in a shower. Avoid baths/pools/lakes/oceans until your wounds have fully healed.  ACTIVITIES as tolerated:   Avoid heavy lifting (>10lbs or 1 gallon of milk) for the next 6 weeks. You may resume regular daily activities as tolerated--such as daily self-care, walking, climbing stairs--gradually increasing activities as tolerated.  If you can walk 30 minutes without difficulty, it is safe to try more intense activity such as jogging, treadmill, bicycling, low-impact aerobics.  DO NOT PUSH THROUGH PAIN.  Let pain be your guide: If it hurts to do something, don't do it. You may drive when you are no longer taking prescription pain medication, you can comfortably wear a seatbelt, and you can safely maneuver your car and apply brakes.  FOLLOW UP in our   office Please call [facility] at [phone] to set up an appointment to see your surgeon in the office for a follow-up appointment approximately 2 weeks after your surgery. Make sure that you call for this appointment the day you arrive home to insure a convenient appointment time.  9. If you have disability or family leave forms that need to be completed, you may have  them completed by your primary care physician's office; for return to work instructions, please ask our office staff and they will be happy to assist you in obtaining this documentation   When to call us [phone]: Poor pain control Reactions / problems with new medications (rash/itching, etc)  Fever over 101.5 F (38.5 C) Inability to urinate Nausea/vomiting Worsening swelling or bruising Continued bleeding from incision. Increased pain, redness, or drainage from the incision  The clinic staff is available to answer your questions during regular business hours (8:30am-5pm).  Please don't hesitate to call and ask to speak to one of our nurses for clinical concerns.   A surgeon from [facility] is always on call at the hospitals   If you have a medical emergency, go to the nearest emergency room or call 911.  [facility] [address], [address], [place], [place]  [address] MAIN: [phone] FAX: [phone] [email or web address]  

## 2023-10-09 NOTE — TOC Initial Note (Signed)
Transition of Care [facility]) - Initial/Assessment Note    Patient Details  Name: Anne Roach MRN: [id number] Date of Birth: 04/23/1933  Transition of Care [facility]) CM/SW Contact:    Erin Sons, LCSW Phone Number: 10/09/2023, 2:17 PM  Clinical Narrative:                  CSW met with pt bedside and communicated using video interpreter. Pt lives at home with her daughter and has a HHA that comes for 2.5 hrs on days that daughter is at work. CSW discussed HH recommendation. Pt states she is already active with [facility] for [facility]. She already has all DME she needs.   CSW confirmed with [facility] that pt is currently active with them. TOC will continue to follow for any additional needs.   Expected Discharge Plan: Home w Home Health Services Barriers to Discharge: Continued Medical Work up   Patient Goals and CMS Choice            Expected Discharge Plan and Services       Living arrangements for the past 2 months: Single Family Home                                      Prior Living Arrangements/Services Living arrangements for the past 2 months: Single Family Home Lives with:: Adult Children Patient language and need for interpreter reviewed:: Yes        Need for Family Participation in Patient Care: No (Comment) Care giver support system in place?: Yes (comment)   Criminal Activity/Legal Involvement Pertinent to Current Situation/Hospitalization: No - Comment as needed  Activities of Daily Living   ADL Screening (condition at time of admission) Independently performs ADLs?: No Does the patient have a NEW difficulty with bathing/dressing/toileting/self-feeding that is expected to last >3 days?: No Does the patient have a NEW difficulty with getting in/out of bed, walking, or climbing stairs that is expected to last >3 days?: Yes (Initiates electronic notice to provider for possible PT consult) Does the patient have a NEW difficulty with communication that is expected  to last >3 days?: No Is the patient deaf or have difficulty hearing?: No Does the patient have difficulty seeing, even when wearing glasses/contacts?: No Does the patient have difficulty concentrating, remembering, or making decisions?: Yes  Permission Sought/Granted                  Emotional Assessment Appearance:: Appears stated age Attitude/Demeanor/Rapport: Engaged Affect (typically observed): Accepting Orientation: : Oriented to Self, Oriented to Place, Oriented to  Time, Oriented to Situation Alcohol / Substance Use: Not Applicable Psych Involvement: No (comment)  Admission diagnosis:  S/P right hemicolectomy [Z90.49] Patient Active Problem List   Diagnosis Date Noted   S/P right hemicolectomy 10/08/2023   Cancer of right colon (HCC) 08/11/2023   AKI (acute kidney injury) (HCC) 11/25/2022   Acute renal failure superimposed on stage 3b chronic kidney disease (HCC) 11/23/2022   Lactic acidosis 11/23/2022   Aortic atherosclerosis (HCC) 11/20/2022   Pleural effusion    Dysphagia    Chronic pain 03/27/2022   Permanent atrial fibrillation (HCC) 01/29/2021   Secondary hypercoagulable state (HCC) 01/29/2021   SIRS (systemic inflammatory response syndrome) (HCC) 10/31/2020   Abdominal pain 10/31/2020   GERD (gastroesophageal reflux disease) 10/31/2020   Chronic diastolic CHF (congestive heart failure) (HCC) 10/31/2020   Chronic atrial fibrillation (HCC) 07/11/2020  Campylobacter gastrointestinal tract infection 01/03/2019   Chronic kidney disease (CKD), stage III (moderate) 01/02/2019   Hypoalbuminemia 01/02/2019   Bandemia without diagnosis of specific infection 01/02/2019   Hypotension 01/02/2019   Bacteria in urine 01/01/2019   Myogenic ptosis of bilateral eyelids 06/24/2016   HTN (hypertension)    Tremor    Exudative age-related macular degeneration of right eye with active choroidal neovascularization (HCC) 08/29/2014   Pseudophakia of both eyes 08/29/2014   OSA  (obstructive sleep apnea) 06/29/2012   Type 2 diabetes mellitus (HCC)    Incidental lung nodule 03/10/2009   Other diseases of lung, not elsewhere classified 03/10/2009   PCP:  Leilani Able, MD Pharmacy:   [name] [place], [place] - [address] AT [facility] & [address] [place] [address] Phone: [phone] Fax: [phone]  [facility] [address] [place] [address] Phone: [phone] Fax: [phone]  [facility] [address] [place] [address] Phone: [phone] Fax: [phone]     Social Determinants of Health (SDOH) Social History: SDOH Screenings   Food Insecurity: No Food Insecurity (10/08/2023)  Housing: Low Risk  (10/08/2023)  Transportation Needs: No Transportation Needs (10/08/2023)  Utilities: Not At Risk (10/08/2023)  Alcohol Screen: Low Risk  (03/28/2022)  Financial Resource Strain: Low Risk  (03/28/2022)  Tobacco Use: Low Risk  (10/08/2023)   SDOH Interventions:     Readmission Risk Interventions     No data to display

## 2023-10-09 NOTE — Progress Notes (Signed)
PROGRESS NOTE/consult    Anne Roach  QIO:[id number] DOB: 1933/05/17 DOA: 10/08/2023 PCP: Leilani Able, MD    No chief complaint on file.   Brief Narrative:  Patient is a 89 year old female history of hypertension, chronic diastolic CHF, non-insulin-dependent type 2 diabetes, hypertension, A-fib on chronic anticoagulation with Eliquis who underwent laparoscopic right hemicolectomy per primary team, Dr. Cliffton Asters with general surgery for cecal colon cancer and hospitalist were consulted for medical management of chronic medical issues.   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  2.  Postoperative acute blood loss anemia -Patient denies any overt bleeding. -Hemoglobin at 7.4 this morning with repeat at 7.3. -Anemia panel with a iron level of 59, TIBC of 230, ferritin of 30, folate >40. -Due to patient's cardiac history transfusion threshold for hemoglobin <8. -Transfusion offered to patient and family however daughter feels hemoglobin has not equilibrated yet and requesting hold off on transfusion for now until repeat labs are done in the AM. -Continue to hold Eliquis. -Supportive care.  3.  Hypertension -Continue current regimen of Toprol-XL and home regimen propranolol.  4.  Atrial fibrillation -Continue Toprol-XL and propranolol for rate control. -Eliquis on hold per general surgery at least until 10/10/2023. -Will defer resumption of Eliquis to primary team.  5.  Chronic diastolic CHF -Patient currently euvolemic and stable. -Continue beta-blocker and diuretics.  6.  Well-controlled diabetes mellitus type 2 -Hemoglobin A1c  5.3 (09/30/2023) -Diet has been advanced to carb modified diet. -Continue to hold home regimen Jardiance -Outpatient follow-up with PCP.  7.  Dementia -Continue home regimen Aricept.    DVT prophylaxis: Per primary team Code Status: Full Family Communication: Updated patient and daughter at bedside. Disposition: Per primary team.  Status is: Inpatient Remains inpatient appropriate because: Severity of illness   Consultants:  [facility] hospitalist: Dr. Kirby Crigler 10/08/2023  Procedures:  Laparoscopic right hemicolectomy with lysis of adhesions x 25 minutes/partial omentectomy per Dr. Cliffton Asters General Surgery 10/08/2023  Antimicrobials:  Anti-infectives (From admission, onward)    Start     Dose/Rate Route Frequency Ordered Stop   10/08/23 1400  neomycin (MYCIFRADIN) tablet 1,000 mg  Status:  Discontinued       Placed in "And" Linked Group   1,000 mg Oral 3 times per day 10/08/23 0547 10/08/23 0551   10/08/23 1400  metroNIDAZOLE (FLAGYL) tablet 1,000 mg  Status:  Discontinued       Placed in "And" Linked Group   1,000 mg Oral 3 times per day 10/08/23 0547 10/08/23 0551   10/08/23 0600  cefoTEtan (CEFOTAN) 2 g in sodium chloride 0.9 % 100 mL IVPB        2 g 200 mL/hr over 30 Minutes Intravenous On call to O.R. 10/08/23 0547 10/08/23 0814         Subjective: Patient sitting up in recliner, daughter at bedside.  Patient denies any chest pain or shortness of breath.  No abdominal pain.  Denies any bleeding.  Objective: Vitals:   10/09/23 0215 10/09/23 0614 10/09/23 0855 10/09/23 1415  BP: 104/66 109/68 117/61 (!) 109/50  Pulse: 99 84 100 77  Resp: 18 18 17 17   Temp: 98.1 F (36.7  C) 97.9 F (36.6 C) 99.2 F (37.3 C) 98.4 F (36.9 C)  TempSrc: Oral Oral Oral Oral  SpO2: 97% 96% 97% 96%  Weight:  75.9 kg    Height:        Intake/Output Summary (Last 24 hours) at 10/09/2023 1752 Last data filed at 10/09/2023 1421 Gross per 24 hour  Intake 1757.5 ml  Output 600 ml   Net 1157.5 ml   Filed Weights   10/08/23 0544 10/09/23 0614  Weight: 70.3 kg 75.9 kg    Examination:  General exam: NAD. Respiratory system: Clear to auscultation.  No wheezes, no crackles, no rhonchi.  Fair air movement.  Speaking in full sentences.  Respiratory effort normal. Cardiovascular system: S1 & S2 heard, RRR. No JVD, murmurs, rubs, gallops or clicks. No pedal edema. Gastrointestinal system: Abdomen is nondistended, soft and nontender. No organomegaly or masses felt. Normal bowel sounds heard.  Incision site C/D/I. Central nervous system: Alert and oriented. No focal neurological deficits. Extremities: Symmetric 5 x 5 power. Skin: No rashes, lesions or ulcers Psychiatry: Judgement and insight appear normal. Mood & affect appropriate.     Data Reviewed: I have personally reviewed following labs and imaging studies  CBC: Recent Labs  Lab 10/09/23 0503 10/09/23 1123  WBC 10.1 10.8*  NEUTROABS  --  7.2  HGB 7.4* 7.3*  HCT 25.0* 23.2*  MCV 100.0 98.7  PLT 194 195    Basic Metabolic Panel: Recent Labs  Lab 10/09/23 0503  NA 138  K 4.3  CL 106  CO2 23  GLUCOSE 114*  BUN 19  CREATININE 1.37*  CALCIUM 8.5*    GFR: Estimated Creatinine Clearance: 28.9 mL/min (A) (by C-G formula based on SCr of 1.37 mg/dL (H)).  Liver Function Tests: No results for input(s): "AST", "ALT", "ALKPHOS", "BILITOT", "PROT", "ALBUMIN" in the last 168 hours.  CBG: Recent Labs  Lab 10/08/23 0547 10/08/23 1219 10/08/23 1536  GLUCAP 115* 147* 133*     No results found for this or any previous visit (from the past 240 hour(s)).       Radiology Studies: No results found.      Scheduled Meds:  sodium chloride   Intravenous Once   acetaminophen  1,000 mg Oral Q6H   acetaminophen  650 mg Oral Once   allopurinol  100 mg Oral Daily   ascorbic acid  500 mg Oral BID WC   cholecalciferol  5,000 Units Oral Q breakfast   donepezil  5 mg Oral QHS   feeding supplement   237 mL Oral BID BM   ferrous sulfate  325 mg Oral BID WC   folic acid  1,000 mcg Oral Daily   furosemide  20 mg Intravenous Once   furosemide  40 mg Oral Daily   gabapentin  400 mg Oral TID   influenza vaccine adjuvanted  0.5 mL Intramuscular Tomorrow-1000   metoprolol succinate  50 mg Oral Daily   potassium chloride SA  20 mEq Oral Daily   propranolol  20 mg Oral BID   Continuous Infusions:  sodium chloride 50 mL/hr at 10/09/23 0826     LOS: 1 day    Time spent: 35 minutes    Ramiro Harvest, MD [facility]   To contact the attending provider between 7A-7P or the covering provider during after hours 7P-7A, please log into the web site [email or web address] and access using universal [place] password for that web site. If you do not have the password, please call the hospital operator.  10/09/2023, 5:52 PM

## 2023-10-09 NOTE — Progress Notes (Addendum)
Subjective No acute events. Feeling quite well.  No nausea/vomiting.  Denies any pain.  Has not been out of bed yet.  Has been passing flatus.  Reports 3 BM - 1 yesterday, 2 today; notes some blood in her stool.   Objective: Vital signs in last 24 hours: Temp:  [97.5 F (36.4 C)-98.6 F (37 C)] 97.9 F (36.6 C) (11/22 0614) Pulse Rate:  [84-101] 84 (11/22 0614) Resp:  [14-20] 18 (11/22 0614) BP: (101-173)/(52-89) 109/68 (11/22 0614) SpO2:  [90 %-100 %] 96 % (11/22 4098) Weight:  [75.9 kg] 75.9 kg (11/22 0614) Last BM Date : 10/07/23  Intake/Output from previous day: 11/21 0701 - 11/22 0700 In: 2853.8 [P.O.:660; I.V.:2093.8; IV Piggyback:100] Out: 850 [Urine:800; Blood:50] Intake/Output this shift: No intake/output data recorded.  Gen: NAD, comfortable CV: RRR Pulm: Normal work of breathing Abd: Soft, NT/ND.  Incisions-clean/dry/intact.  Some ecchymoses.  No erythema.  No drainage. Ext: SCDs in place  Lab Results: CBC  Recent Labs    10/09/23 0503  WBC 10.1  HGB 7.4*  HCT 25.0*  PLT 194   BMET Recent Labs    10/09/23 0503  NA 138  K 4.3  CL 106  CO2 23  GLUCOSE 114*  BUN 19  CREATININE 1.37*  CALCIUM 8.5*   PT/INR No results for input(s): "LABPROT", "INR" in the last 72 hours. ABG No results for input(s): "PHART", "HCO3" in the last 72 hours.  Invalid input(s): "PCO2", "PO2"  Studies/Results:  Anti-infectives: Anti-infectives (From admission, onward)    Start     Dose/Rate Route Frequency Ordered Stop   10/08/23 1400  neomycin (MYCIFRADIN) tablet 1,000 mg  Status:  Discontinued       Placed in "And" Linked Group   1,000 mg Oral 3 times per day 10/08/23 0547 10/08/23 0551   10/08/23 1400  metroNIDAZOLE (FLAGYL) tablet 1,000 mg  Status:  Discontinued       Placed in "And" Linked Group   1,000 mg Oral 3 times per day 10/08/23 0547 10/08/23 0551   10/08/23 0600  cefoTEtan (CEFOTAN) 2 g in sodium chloride 0.9 % 100 mL IVPB        2 g 200 mL/hr over  30 Minutes Intravenous On call to O.R. 10/08/23 0547 10/08/23 0814        Assessment/Plan: Patient Active Problem List   Diagnosis Date Noted   S/P right hemicolectomy 10/08/2023   Cancer of right colon (HCC) 08/11/2023   AKI (acute kidney injury) (HCC) 11/25/2022   Acute renal failure superimposed on stage 3b chronic kidney disease (HCC) 11/23/2022   Lactic acidosis 11/23/2022   Aortic atherosclerosis (HCC) 11/20/2022   Pleural effusion    Dysphagia    Chronic pain 03/27/2022   Permanent atrial fibrillation (HCC) 01/29/2021   Secondary hypercoagulable state (HCC) 01/29/2021   SIRS (systemic inflammatory response syndrome) (HCC) 10/31/2020   Abdominal pain 10/31/2020   GERD (gastroesophageal reflux disease) 10/31/2020   Chronic diastolic CHF (congestive heart failure) (HCC) 10/31/2020   Chronic atrial fibrillation (HCC) 07/11/2020   Campylobacter gastrointestinal tract infection 01/03/2019   Chronic kidney disease (CKD), stage III (moderate) 01/02/2019   Hypoalbuminemia 01/02/2019   Bandemia without diagnosis of specific infection 01/02/2019   Hypotension 01/02/2019   Bacteria in urine 01/01/2019   Myogenic ptosis of bilateral eyelids 06/24/2016   HTN (hypertension)    Tremor    Exudative age-related macular degeneration of right eye with active choroidal neovascularization (HCC) 08/29/2014   Pseudophakia of both eyes 08/29/2014  OSA (obstructive sleep apnea) 06/29/2012   Type 2 diabetes mellitus (HCC)    Incidental lung nodule 03/10/2009   Other diseases of lung, not elsewhere classified 03/10/2009   s/p Procedure(s): LAPAROSCOPIC RIGHT HEMICOLECTOMY 10/08/2023  - Doing quite well - Acute blood loss anemia - monitor hemoglobin-will repeat later today.  Holding subQ heparin given this finding as well as her Eliquis.  Will plan to restart Eliquis once her hemoglobin remains stable for at least 24 hours and no blood in stool - AKI-GFR 37; D/C ibuprofen; cont IVF support;  good UOP at present - [facility] following for assistance and comorbidity management - appreciate their help in her care. - Advance to carb modified diet - Ambulate-PT/OT ordered - PPX: SCDs; holding chemical dvt ppx + eliquis given acute blood loss anemia  We spent time with her daughter at bedside reviewing procedure, findings, and plans moving forward.   LOS: 1 day    Marin Olp, MD [facility]

## 2023-10-09 NOTE — Progress Notes (Signed)
Mobility Specialist - Progress Note   10/09/23 1320  Mobility  Activity Ambulated with assistance in hallway  Level of Assistance Contact guard assist, steadying assist  Assistive Device Front wheel walker  Distance Ambulated (ft) 275 ft  Activity Response Tolerated well  Mobility Referral Yes  $Mobility charge 1 Mobility  Mobility Specialist Start Time (ACUTE ONLY) 1302  Mobility Specialist Stop Time (ACUTE ONLY) 1320  Mobility Specialist Time Calculation (min) (ACUTE ONLY) 18 min   Pt received in bed and agreeable to mobility. Pt was minA from STS. Pt took x1 seated rest break d/t fatigue. No complaints during session. Pt to recliner after session with all needs met.    [facility]

## 2023-10-10 DIAGNOSIS — I10 Essential (primary) hypertension: Secondary | ICD-10-CM | POA: Diagnosis not present

## 2023-10-10 DIAGNOSIS — I5032 Chronic diastolic (congestive) heart failure: Secondary | ICD-10-CM | POA: Diagnosis not present

## 2023-10-10 DIAGNOSIS — D62 Acute posthemorrhagic anemia: Secondary | ICD-10-CM | POA: Diagnosis not present

## 2023-10-10 DIAGNOSIS — D649 Anemia, unspecified: Secondary | ICD-10-CM

## 2023-10-10 DIAGNOSIS — Z9049 Acquired absence of other specified parts of digestive tract: Secondary | ICD-10-CM | POA: Diagnosis not present

## 2023-10-10 LAB — BASIC METABOLIC PANEL WITH GFR
Anion gap: 8 (ref 5–15)
BUN: 14 mg/dL (ref 8–23)
CO2: 24 mmol/L (ref 22–32)
Calcium: 8.1 mg/dL — ABNORMAL LOW (ref 8.9–10.3)
Chloride: 108 mmol/L (ref 98–111)
Creatinine, Ser: 1.1 mg/dL — ABNORMAL HIGH (ref 0.44–1.00)
GFR, Estimated: 48 mL/min — ABNORMAL LOW
Glucose, Bld: 97 mg/dL (ref 70–99)
Potassium: 3.8 mmol/L (ref 3.5–5.1)
Sodium: 140 mmol/L (ref 135–145)

## 2023-10-10 LAB — CBC
HCT: 24.1 % — ABNORMAL LOW (ref 36.0–46.0)
Hemoglobin: 7.3 g/dL — ABNORMAL LOW (ref 12.0–15.0)
MCH: 30.7 pg (ref 26.0–34.0)
MCHC: 30.3 g/dL (ref 30.0–36.0)
MCV: 101.3 fL — ABNORMAL HIGH (ref 80.0–100.0)
Platelets: 183 10*3/uL (ref 150–400)
RBC: 2.38 MIL/uL — ABNORMAL LOW (ref 3.87–5.11)
RDW: 14.6 % (ref 11.5–15.5)
WBC: 8.7 10*3/uL (ref 4.0–10.5)
nRBC: 0 % (ref 0.0–0.2)

## 2023-10-10 LAB — PREPARE RBC (CROSSMATCH)

## 2023-10-10 LAB — HEMOGLOBIN AND HEMATOCRIT, BLOOD
HCT: 29 % — ABNORMAL LOW (ref 36.0–46.0)
Hemoglobin: 8.6 g/dL — ABNORMAL LOW (ref 12.0–15.0)

## 2023-10-10 MED ORDER — SODIUM CHLORIDE 0.9% IV SOLUTION: Freq: Once | INTRAVENOUS | Status: AC

## 2023-10-10 MED ORDER — HEPARIN SODIUM (PORCINE) 5000 UNIT/ML IJ SOLN
5000.0000 [IU] | Freq: Three times a day (TID) | INTRAMUSCULAR | Status: AC
  Administered 2023-10-10 – 2023-10-11 (×4): 5000 [IU] via SUBCUTANEOUS
  Filled 2023-10-10 (×4): qty 1

## 2023-10-10 MED ORDER — LACTATED RINGERS IV BOLUS
500.0000 mL | Freq: Once | INTRAVENOUS | Status: AC
  Administered 2023-10-10: 500 mL via INTRAVENOUS

## 2023-10-10 NOTE — Progress Notes (Signed)
PROGRESS NOTE/consult    Anne Roach  ZOX:[id number] DOB: 12-31-1932 DOA: 10/08/2023 PCP: Leilani Able, MD    No chief complaint on file.   Brief Narrative:  Patient is a 89 year old female history of hypertension, chronic diastolic CHF, non-insulin-dependent type 2 diabetes, hypertension, A-fib on chronic anticoagulation with Eliquis who underwent laparoscopic right hemicolectomy per primary team, Dr. Cliffton Asters with general surgery for cecal colon cancer and hospitalist were consulted for medical management of chronic medical issues.   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  2.  Postoperative acute blood loss anemia/symptomatic anemia -Patient denies any overt bleeding. -Hemoglobin at 7.3 this morning from 7.4 yesterday morning.  -Patient with some complaints of dizziness. -Anemia panel with a iron level of 59, TIBC of 230, ferritin of 30, folate >40. -Due to patient's cardiac history transfusion threshold for hemoglobin <8. -Transfusion offered to patient and family on 10/09/2023, however daughter felt hemoglobin had not equilibrated yet and requested to hold off on transfusion. -Patient with some dizziness now likely symptomatic from anemia.  -Hemoglobin stable at 7.3 this morning from yesterday morning.  -Discussed with family who are currently in agreement of transfusion of a unit of PRBCs.  -Transfuse 1 unit PRBCs.  -Continue to hold Eliquis. -Supportive care.  3.  Hypertension -Patient currently on Toprol-XL and propranolol which we will continue due to history of A-fib.   -Patient with complaints of  dizziness.   -Concern for orthostasis.   -Check orthostatics.   -Follow.  4.  Atrial fibrillation -Continue Toprol-XL and propranolol for rate control. -Eliquis on hold per general surgery at least until 10/11/2023. -Will defer resumption of Eliquis to primary team.  5.  Chronic diastolic CHF -Patient currently euvolemic and stable. -Patient with complaints of dizziness. -Continue beta-blocker.  -Discontinue diuretics as patient with complaints of dizziness and concern for orthostasis.   6.  Well-controlled diabetes mellitus type 2 -Hemoglobin A1c 5.3 (09/30/2023) -Diet has been advanced to carb modified diet. -Continue to hold home regimen Jardiance -Outpatient follow-up with PCP.  7.  Dementia -Continue home regimen Aricept.     DVT prophylaxis: Per primary team Code Status: Full Family Communication: Updated patient and daughter at bedside. Disposition: Per primary team.  Status is: Inpatient Remains inpatient appropriate because: Severity of illness   Consultants:  [facility] hospitalist: Dr. Kirby Crigler 10/08/2023  Procedures:  Laparoscopic right hemicolectomy with lysis of adhesions x 25 minutes/partial omentectomy per Dr. Cliffton Asters General Surgery 10/08/2023  Antimicrobials:  Anti-infectives (From admission, onward)    Start     Dose/Rate Route Frequency Ordered Stop   10/08/23 1400  neomycin (MYCIFRADIN) tablet 1,000 mg  Status:  Discontinued       Placed in "And" Linked Group   1,000 mg Oral 3 times per day 10/08/23 0547 10/08/23 0551   10/08/23 1400  metroNIDAZOLE (FLAGYL) tablet 1,000 mg  Status:  Discontinued       Placed in "And" Linked Group   1,000 mg Oral 3 times per day 10/08/23 0547 10/08/23 0551   10/08/23 0600  cefoTEtan (CEFOTAN) 2 g in sodium chloride 0.9 % 100 mL IVPB  2 g 200 mL/hr over 30 Minutes Intravenous On call to O.R. 10/08/23 0547 10/08/23 0814         Subjective: Patient laying in bed sleeping.  Noted to have some complaints of  abdominal pain, just received pain medication with some improvement with abdominal pain.  Denies any chest pain or shortness of breath.  Complaining of some dizziness.  Daughter at bedside.   Objective: Vitals:   10/10/23 1548 10/10/23 1549 10/10/23 1550 10/10/23 1610  BP: (!) 117/58 (!) 96/48 (!) 93/55 127/69  Pulse: 100   92  Resp: 16   16  Temp: 98 F (36.7 C)   97.6 F (36.4 C)  TempSrc: Oral     SpO2: 95%     Weight:      Height:        Intake/Output Summary (Last 24 hours) at 10/10/2023 1621 Last data filed at 10/10/2023 1000 Gross per 24 hour  Intake 1351.28 ml  Output --  Net 1351.28 ml   Filed Weights   10/08/23 0544 10/09/23 0614  Weight: 70.3 kg 75.9 kg    Examination:  General exam: NAD. Respiratory system: CTAB anterior lung fields.  No wheezes, no crackles, no rhonchi.  Fair air movement.  Speaking in full sentences.  Cardiovascular system: Regular rate rhythm no murmurs rubs or gallops.  No JVD.  No pitting lower extremity edema.  Gastrointestinal system: Abdomen is soft, nondistended, some diffuse tenderness to palpation.  Positive bowel sounds.  No rebound.  No guarding.  Incision site C/D/I. Central nervous system: Alert and oriented. No focal neurological deficits. Extremities: Symmetric 5 x 5 power. Skin: No rashes, lesions or ulcers Psychiatry: Judgement and insight appear normal. Mood & affect appropriate.     Data Reviewed: I have personally reviewed following labs and imaging studies  CBC: Recent Labs  Lab 10/09/23 0503 10/09/23 1123 10/10/23 0844  WBC 10.1 10.8* 8.7  NEUTROABS  --  7.2  --   HGB 7.4* 7.3* 7.3*  HCT 25.0* 23.2* 24.1*  MCV 100.0 98.7 101.3*  PLT 194 195 183    Basic Metabolic Panel: Recent Labs  Lab 10/09/23 0503 10/10/23 0844  NA 138 140  K 4.3 3.8  CL 106 108  CO2 23 24  GLUCOSE 114* 97  BUN 19 14  CREATININE 1.37* 1.10*  CALCIUM 8.5* 8.1*    GFR: Estimated Creatinine Clearance: 36.1 mL/min (A) (by C-G  formula based on SCr of 1.1 mg/dL (H)).  Liver Function Tests: No results for input(s): "AST", "ALT", "ALKPHOS", "BILITOT", "PROT", "ALBUMIN" in the last 168 hours.  CBG: Recent Labs  Lab 10/08/23 0547 10/08/23 1219 10/08/23 1536  GLUCAP 115* 147* 133*     No results found for this or any previous visit (from the past 240 hour(s)).       Radiology Studies: No results found.      Scheduled Meds:  acetaminophen  1,000 mg Oral Q6H   acetaminophen  650 mg Oral Once   allopurinol  100 mg Oral Daily   ascorbic acid  500 mg Oral BID WC   cholecalciferol  5,000 Units Oral Q breakfast   donepezil  5 mg Oral QHS   feeding supplement  237 mL Oral BID BM   ferrous sulfate  325 mg Oral BID WC   folic acid  1,000 mcg Oral Daily   furosemide  20 mg Intravenous Once   gabapentin  400 mg Oral TID   heparin injection (subcutaneous)  5,000 Units Subcutaneous Q8H  influenza vaccine adjuvanted  0.5 mL Intramuscular Tomorrow-1000   metoprolol succinate  50 mg Oral Daily   potassium chloride SA  20 mEq Oral Daily   propranolol  20 mg Oral BID   Continuous Infusions:  lactated ringers        LOS: 2 days    Time spent: 35 minutes    Ramiro Harvest, MD [facility]   To contact the attending provider between 7A-7P or the covering provider during after hours 7P-7A, please log into the web site [email or web address] and access using universal [place] password for that web site. If you do not have the password, please call the hospital operator.  10/10/2023, 4:21 PM

## 2023-10-10 NOTE — Plan of Care (Signed)
  Problem: Activity: Goal: Ability to tolerate increased activity will improve Outcome: Progressing   Problem: Bowel/Gastric: Goal: Gastrointestinal status for postoperative course will improve Outcome: Progressing   Problem: Nutritional: Goal: Will attain and maintain optimal nutritional status will improve Outcome: Progressing   Problem: Clinical Measurements: Goal: Postoperative complications will be avoided or minimized Outcome: Progressing   Problem: Respiratory: Goal: Respiratory status will improve Outcome: Progressing   Problem: Education: Goal: Knowledge of General Education information will improve Description: Including pain rating scale, medication(s)/side effects and non-pharmacologic comfort measures Outcome: Progressing

## 2023-10-10 NOTE — Progress Notes (Signed)
Subjective No acute events. States she is having some abd discomfort today.   No nausea/vomiting.  Got out of bed yesterday and spent time in chair, ambulated in halls.     Has been passing flatus.  Reports 3 BM -   Daughter not at [facility] Objective: Vital signs in last 24 hours: Temp:  [98.3 F (36.8 C)-98.6 F (37 C)] 98.6 F (37 C) (11/23 0529) Pulse Rate:  [77-105] 96 (11/23 0854) Resp:  [17-18] 18 (11/23 0529) BP: (109-125)/(50-67) 125/67 (11/23 0854) SpO2:  [92 %-96 %] 92 % (11/23 0529) Last BM Date : 10/09/23  Intake/Output from previous day: 11/22 0701 - 11/23 0700 In: 1831.3 [P.O.:960; I.V.:871.3] Out: -  Intake/Output this shift: No intake/output data recorded.  Gen: NAD, comfortable CV: RRR Pulm: Normal work of breathing Abd: Soft, NT/ND.  Incisions-clean/dry/intact.  Some ecchymoses.  No erythema.  No drainage. Ext: SCDs in place  Lab Results: CBC  Recent Labs    10/09/23 1123 10/10/23 0844  WBC 10.8* 8.7  HGB 7.3* 7.3*  HCT 23.2* 24.1*  PLT 195 183      Latest Ref Rng & Units 10/10/2023    8:44 AM 10/09/2023   11:23 AM 10/09/2023    5:03 AM  CBC  WBC 4.0 - 10.5 K/uL 8.7  10.8  10.1   Hemoglobin 12.0 - 15.0 g/dL 7.3  7.3  7.4   Hematocrit 36.0 - 46.0 % 24.1  23.2  25.0   Platelets 150 - 400 K/uL 183  195  194      BMET Recent Labs    10/09/23 0503 10/10/23 0844  NA 138 140  K 4.3 3.8  CL 106 108  CO2 23 24  GLUCOSE 114* 97  BUN 19 14  CREATININE 1.37* 1.10*  CALCIUM 8.5* 8.1*   PT/INR No results for input(s): "LABPROT", "INR" in the last 72 hours. ABG No results for input(s): "PHART", "HCO3" in the last 72 hours.  Invalid input(s): "PCO2", "PO2"  Studies/Results:  Anti-infectives: Anti-infectives (From admission, onward)    Start     Dose/Rate Route Frequency Ordered Stop   10/08/23 1400  neomycin (MYCIFRADIN) tablet 1,000 mg  Status:  Discontinued       Placed in "And" Linked Group   1,000 mg Oral 3 times per day 10/08/23  0547 10/08/23 0551   10/08/23 1400  metroNIDAZOLE (FLAGYL) tablet 1,000 mg  Status:  Discontinued       Placed in "And" Linked Group   1,000 mg Oral 3 times per day 10/08/23 0547 10/08/23 0551   10/08/23 0600  cefoTEtan (CEFOTAN) 2 g in sodium chloride 0.9 % 100 mL IVPB        2 g 200 mL/hr over 30 Minutes Intravenous On call to O.R. 10/08/23 0547 10/08/23 0814        Assessment/Plan: Patient Active Problem List   Diagnosis Date Noted   Acute postoperative anemia due to expected blood loss 10/09/2023   S/P right hemicolectomy 10/08/2023   Cancer of right colon (HCC) 08/11/2023   AKI (acute kidney injury) (HCC) 11/25/2022   Acute renal failure superimposed on stage 3b chronic kidney disease (HCC) 11/23/2022   Lactic acidosis 11/23/2022   Aortic atherosclerosis (HCC) 11/20/2022   Pleural effusion    Dysphagia    Chronic pain 03/27/2022   Permanent atrial fibrillation (HCC) 01/29/2021   Secondary hypercoagulable state (HCC) 01/29/2021   SIRS (systemic inflammatory response syndrome) (HCC) 10/31/2020   Abdominal pain 10/31/2020   GERD (gastroesophageal reflux  disease) 10/31/2020   Chronic diastolic CHF (congestive heart failure) (HCC) 10/31/2020   Chronic atrial fibrillation (HCC) 07/11/2020   Campylobacter gastrointestinal tract infection 01/03/2019   Chronic kidney disease (CKD), stage III (moderate) 01/02/2019   Hypoalbuminemia 01/02/2019   Bandemia without diagnosis of specific infection 01/02/2019   Hypotension 01/02/2019   Bacteria in urine 01/01/2019   Myogenic ptosis of bilateral eyelids 06/24/2016   HTN (hypertension)    Tremor    Exudative age-related macular degeneration of right eye with active choroidal neovascularization (HCC) 08/29/2014   Pseudophakia of both eyes 08/29/2014   OSA (obstructive sleep apnea) 06/29/2012   Type 2 diabetes mellitus (HCC)    Incidental lung nodule 03/10/2009   Other diseases of lung, not elsewhere classified 03/10/2009   s/p  Procedure(s): LAPAROSCOPIC RIGHT HEMICOLECTOMY 10/08/2023  - Doing quite well - Acute blood loss anemia - monitor hemoglobin-hgb so far stable at 7.3.  Held subQ heparin given this finding as well as her Eliquis.  Will plan to restart Eliquis once her hemoglobin remains stable for at least 24 hours and no blood in stool - AKI-GFR 37; D/C ibuprofen; cont IVF support; good UOP at present - [facility] following for assistance and comorbidity management - appreciate their help in her care. - Advance to carb modified diet - Ambulate-PT/OT ordered - PPX: SCDs; will restart subcu heparin today chemical dvt ppx + hold eliquis given acute blood loss anemia    LOS: 2 days    Mary Sella. Andrey Campanile, MD, FACS General, Bariatric, & Minimally Invasive Surgery [facility] Surgery,  A [facility]

## 2023-10-10 NOTE — Plan of Care (Signed)
  Problem: Activity: Goal: Ability to tolerate increased activity will improve Outcome: Progressing   Problem: Bowel/Gastric: Goal: Gastrointestinal status for postoperative course will improve Outcome: Adequate for Discharge   Problem: Activity: Goal: Risk for activity intolerance will decrease Outcome: Progressing   Problem: Nutrition: Goal: Adequate nutrition will be maintained Outcome: Adequate for Discharge

## 2023-10-11 DIAGNOSIS — D62 Acute posthemorrhagic anemia: Secondary | ICD-10-CM | POA: Diagnosis not present

## 2023-10-11 DIAGNOSIS — E1159 Type 2 diabetes mellitus with other circulatory complications: Secondary | ICD-10-CM | POA: Diagnosis not present

## 2023-10-11 DIAGNOSIS — I5032 Chronic diastolic (congestive) heart failure: Secondary | ICD-10-CM | POA: Diagnosis not present

## 2023-10-11 DIAGNOSIS — Z9049 Acquired absence of other specified parts of digestive tract: Secondary | ICD-10-CM | POA: Diagnosis not present

## 2023-10-11 LAB — BASIC METABOLIC PANEL
Anion gap: 7 (ref 5–15)
BUN: 16 mg/dL (ref 8–23)
CO2: 25 mmol/L (ref 22–32)
Calcium: 8.1 mg/dL — ABNORMAL LOW (ref 8.9–10.3)
Chloride: 106 mmol/L (ref 98–111)
Creatinine, Ser: 0.96 mg/dL (ref 0.44–1.00)
GFR, Estimated: 57 mL/min — ABNORMAL LOW (ref >60)
Glucose, Bld: 95 mg/dL (ref 70–99)
Potassium: 3.5 mmol/L (ref 3.5–5.1)
Sodium: 138 mmol/L (ref 135–145)

## 2023-10-11 LAB — CBC
HCT: 29.4 % — ABNORMAL LOW (ref 36.0–46.0)
Hemoglobin: 8.9 g/dL — ABNORMAL LOW (ref 12.0–15.0)
MCH: 29.7 pg (ref 26.0–34.0)
MCHC: 30.3 g/dL (ref 30.0–36.0)
MCV: 98 fL (ref 80.0–100.0)
Platelets: 200 10*3/uL (ref 150–400)
RBC: 3 MIL/uL — ABNORMAL LOW (ref 3.87–5.11)
RDW: 15.9 % — ABNORMAL HIGH (ref 11.5–15.5)
WBC: 9.1 10*3/uL (ref 4.0–10.5)
nRBC: 0 % (ref 0.0–0.2)

## 2023-10-11 MED ORDER — APIXABAN 5 MG PO TABS
5.0000 mg | ORAL_TABLET | Freq: Two times a day (BID) | ORAL | Status: DC
  Administered 2023-10-11 – 2023-10-12 (×2): 5 mg via ORAL
  Filled 2023-10-11 (×2): qty 1

## 2023-10-11 MED ORDER — DICLOFENAC SODIUM 1 % EX GEL
2.0000 g | Freq: Four times a day (QID) | CUTANEOUS | Status: DC
  Administered 2023-10-11 – 2023-10-12 (×5): 2 g via TOPICAL
  Filled 2023-10-11: qty 100

## 2023-10-11 NOTE — Plan of Care (Signed)
  Problem: Bowel/Gastric: Goal: Gastrointestinal status for postoperative course will improve Outcome: Progressing   Problem: Health Behavior/Discharge Planning: Goal: Identification of community resources to assist with postoperative recovery needs will improve Outcome: Progressing

## 2023-10-11 NOTE — Progress Notes (Signed)
PROGRESS NOTE/consult    Anne Roach  OZH:[id number] DOB: December 21, 1932 DOA: 10/08/2023 PCP: Leilani Able, MD    No chief complaint on file.   Brief Narrative:  Patient is a 89 year old female history of hypertension, chronic diastolic CHF, non-insulin-dependent type 2 diabetes, hypertension, A-fib on chronic anticoagulation with Eliquis who underwent laparoscopic right hemicolectomy per primary team, Dr. Cliffton Asters with general surgery for cecal colon cancer and hospitalist were consulted for medical management of chronic medical issues.   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  2.  Postoperative acute blood loss anemia/symptomatic anemia -Patient denies any overt bleeding. -Hemoglobin at 8.9 this morning after transfusion of 1 unit PRBCs (10/10/2023 ) from 7.3 from 7.4.  -Resolution of dizziness after transfusion of PRBC. -Anemia panel with a iron level of 59, TIBC of 230, ferritin of 30, folate >40. -Due to patient's cardiac history transfusion threshold for hemoglobin <8. -Transfusion offered to patient and family on 10/09/2023, however daughter felt hemoglobin had not equilibrated yet and requested to hold off on transfusion. -Eliquis to be resumed this evening per general surgery. -Follow H&H. -Transfusion threshold hemoglobin < 8.   3.  Hypertension -Patient currently on Toprol-XL and propranolol which we will continue due to history of A-fib.   -Patient with complaints of dizziness on 10/10/2023 and as such diuretics were discontinued. -Patient status post transfusion 1 unit PRBCs with resolution of  dizziness. -BP stable. -Follow.  4.  Atrial fibrillation -Continue Toprol-XL and propranolol for rate control. -Eliquis on hold per general surgery who are recommending resumption of Eliquis this evening, 10/11/2023.    5.  Chronic diastolic CHF -Patient currently euvolemic and stable. -Patient with complaints of dizziness on 10/10/2023 status post transfusion 1 unit PRBCs with resolution of dizziness.. -Continue beta-blocker.  -Diuretics held yesterday we will continue to hold and could likely resume on discharge.  6.  Well-controlled diabetes mellitus type 2 -Hemoglobin A1c 5.3 (09/30/2023) -Diet has been advanced to carb modified diet. -CBG 115 this morning. -Continue to hold home regimen Jardiance -Outpatient follow-up with PCP.  7.  Dementia -Continue home regimen Aricept.  8.  Right knee pain -Voltaren gel 4 times daily.     DVT prophylaxis: Per primary team Code Status: Full Family Communication: Updated patient and daughter at bedside. Disposition: Per primary team.  Status is: Inpatient Remains inpatient appropriate because: Severity of illness   Consultants:  [facility] hospitalist: Dr. Kirby Crigler 10/08/2023  Procedures:  Laparoscopic right hemicolectomy with lysis of adhesions x 25 minutes/partial omentectomy per Dr. Cliffton Asters General Surgery 10/08/2023 Transfusion 1 unit PRBCs 10/10/2023.  Antimicrobials:  Anti-infectives (From admission, onward)    Start     Dose/Rate Route Frequency Ordered Stop   10/08/23 1400  neomycin (MYCIFRADIN) tablet 1,000 mg  Status:  Discontinued       Placed in "And" Linked Group   1,000 mg Oral 3 times per day 10/08/23 0547 10/08/23 0551   10/08/23 1400  metroNIDAZOLE (FLAGYL) tablet 1,000 mg  Status:  Discontinued       Placed in "And" Linked Group   1,000 mg Oral 3 times per day 10/08/23 0547 10/08/23 0551  10/08/23 0600  cefoTEtan (CEFOTAN) 2 g in sodium chloride 0.9 % 100 mL IVPB        2 g 200 mL/hr over 30 Minutes  Intravenous On call to O.R. 10/08/23 0547 10/08/23 0814         Subjective: Patient sitting up in recliner waiting on daughter to bring her lunch.  Denies any chest pain or shortness of breath.  States dizziness has improved after receiving transfusion yesterday.  Some complaints of abdominal pain.  Complaining of right knee pain ongoing since she had a fall.   Objective: Vitals:   10/10/23 1610 10/10/23 1937 10/11/23 0500 10/11/23 0502  BP: 127/69 (!) 145/92  136/80  Pulse: 92 89  100  Resp: 16 18  18   Temp: 97.6 F (36.4 C) 97.9 F (36.6 C)  97.8 F (36.6 C)  TempSrc:      SpO2:  96%  96%  Weight:   77 kg   Height:        Intake/Output Summary (Last 24 hours) at 10/11/2023 1306 Last data filed at 10/11/2023 0747 Gross per 24 hour  Intake 1318.17 ml  Output --  Net 1318.17 ml   Filed Weights   10/08/23 0544 10/09/23 0614 10/11/23 0500  Weight: 70.3 kg 75.9 kg 77 kg    Examination:  General exam: NAD. Respiratory system: Lungs clear to auscultation bilaterally.  No wheezes, no crackles, no rhonchi.  Fair air movement.  Speaking in full sentences.  Cardiovascular system: RRR no murmurs rubs or gallops.  No JVD.  No pitting lower extremity edema.  Gastrointestinal system: Abdomen is soft, nondistended, decreasing diffuse tenderness to palpation.  Positive bowel sounds.  No rebound.  No guarding.  Incision site c/d/I.  Central nervous system: Alert and oriented. No focal neurological deficits. Extremities: Symmetric 5 x 5 power. Skin: No rashes, lesions or ulcers Psychiatry: Judgement and insight appear normal. Mood & affect appropriate.     Data Reviewed: I have personally reviewed following labs and imaging studies  CBC: Recent Labs  Lab 10/09/23 0503 10/09/23 1123 10/10/23 0844 10/10/23 2110 10/11/23 0435  WBC 10.1 10.8* 8.7  --  9.1  NEUTROABS  --  7.2  --   --   --   HGB 7.4* 7.3* 7.3* 8.6* 8.9*  HCT 25.0* 23.2* 24.1* 29.0* 29.4*  MCV 100.0 98.7  101.3*  --  98.0  PLT 194 195 183  --  200    Basic Metabolic Panel: Recent Labs  Lab 10/09/23 0503 10/10/23 0844 10/11/23 0435  NA 138 140 138  K 4.3 3.8 3.5  CL 106 108 106  CO2 23 24 25   GLUCOSE 114* 97 95  BUN 19 14 16   CREATININE 1.37* 1.10* 0.96  CALCIUM 8.5* 8.1* 8.1*    GFR: Estimated Creatinine Clearance: 41.6 mL/min (by C-G formula based on SCr of 0.96 mg/dL).  Liver Function Tests: No results for input(s): "AST", "ALT", "ALKPHOS", "BILITOT", "PROT", "ALBUMIN" in the last 168 hours.  CBG: Recent Labs  Lab 10/08/23 0547 10/08/23 1219 10/08/23 1536  GLUCAP 115* 147* 133*     No results found for this or any previous visit (from the past 240 hour(s)).       Radiology Studies: No results found.      Scheduled Meds:  acetaminophen  1,000 mg Oral Q6H   allopurinol  100 mg Oral Daily   apixaban  5 mg Oral BID   ascorbic acid  500 mg Oral BID WC   cholecalciferol  5,000 Units Oral Q breakfast   donepezil  5 mg Oral QHS   feeding supplement  237 mL Oral BID BM   ferrous sulfate  325 mg Oral BID WC   folic acid  1,000 mcg Oral Daily   furosemide  20 mg Intravenous Once   gabapentin  400 mg Oral TID   heparin injection (subcutaneous)  5,000 Units Subcutaneous Q8H   influenza vaccine adjuvanted  0.5 mL Intramuscular Tomorrow-1000   metoprolol succinate  50 mg Oral Daily   potassium chloride SA  20 mEq Oral Daily   propranolol  20 mg Oral BID   Continuous Infusions:      LOS: 3 days    Time spent: 35 minutes    Ramiro Harvest, MD [facility]   To contact the attending provider between 7A-7P or the covering provider during after hours 7P-7A, please log into the web site [email or web address] and access using universal [place] password for that web site. If you do not have the password, please call the hospital operator.  10/11/2023, 1:06 PM

## 2023-10-11 NOTE — Progress Notes (Signed)
Subjective Received blood yesterday with appropriate response. Still w Bms, no blood. No nausea. Some R sided abd pain. Knee pain is her biggest complaint  Daughter at [facility]  Objective: Vital signs in last 24 hours: Temp:  [97.6 F (36.4 C)-98.2 F (36.8 C)] 97.8 F (36.6 C) (11/24 0502) Pulse Rate:  [89-100] 100 (11/24 0502) Resp:  [16-18] 18 (11/24 0502) BP: (93-145)/(48-92) 136/80 (11/24 0502) SpO2:  [95 %-96 %] 96 % (11/24 0502) Weight:  [77 kg] 77 kg (11/24 0500) Last BM Date : 10/10/23  Intake/Output from previous day: 11/23 0701 - 11/24 0700 In: 1198.2 [P.O.:270; I.V.:34.2; Blood:394] Out: -  Intake/Output this shift: Total I/O In: 240 [P.O.:240] Out: -   Gen: NAD, comfortable CV: RRR Pulm: Normal work of breathing Abd: Soft, nondistended, mildly tender in R hemiabdomen. Incisions-clean/dry/intact.  Some ecchymoses.  No erythema.  No drainage. Ext: SCDs in place  Lab Results: CBC  Recent Labs    10/10/23 0844 10/10/23 2110 10/11/23 0435  WBC 8.7  --  9.1  HGB 7.3* 8.6* 8.9*  HCT 24.1* 29.0* 29.4*  PLT 183  --  200      Latest Ref Rng & Units 10/11/2023    4:35 AM 10/10/2023    9:10 PM 10/10/2023    8:44 AM  CBC  WBC 4.0 - 10.5 K/uL 9.1   8.7   Hemoglobin 12.0 - 15.0 g/dL 8.9  8.6  7.3   Hematocrit 36.0 - 46.0 % 29.4  29.0  24.1   Platelets 150 - 400 K/uL 200   183      BMET Recent Labs    10/10/23 0844 10/11/23 0435  NA 140 138  K 3.8 3.5  CL 108 106  CO2 24 25  GLUCOSE 97 95  BUN 14 16  CREATININE 1.10* 0.96  CALCIUM 8.1* 8.1*   PT/INR No results for input(s): "LABPROT", "INR" in the last 72 hours. ABG No results for input(s): "PHART", "HCO3" in the last 72 hours.  Invalid input(s): "PCO2", "PO2"  Studies/Results:  Anti-infectives: Anti-infectives (From admission, onward)    Start     Dose/Rate Route Frequency Ordered Stop   10/08/23 1400  neomycin (MYCIFRADIN) tablet 1,000 mg  Status:  Discontinued       Placed in "And"  Linked Group   1,000 mg Oral 3 times per day 10/08/23 0547 10/08/23 0551   10/08/23 1400  metroNIDAZOLE (FLAGYL) tablet 1,000 mg  Status:  Discontinued       Placed in "And" Linked Group   1,000 mg Oral 3 times per day 10/08/23 0547 10/08/23 0551   10/08/23 0600  cefoTEtan (CEFOTAN) 2 g in sodium chloride 0.9 % 100 mL IVPB        2 g 200 mL/hr over 30 Minutes Intravenous On call to O.R. 10/08/23 0547 10/08/23 0814        Assessment/Plan: Patient Active Problem List   Diagnosis Date Noted   Symptomatic anemia 10/10/2023   Acute postoperative anemia due to expected blood loss 10/09/2023   S/P right hemicolectomy 10/08/2023   Cancer of right colon (HCC) 08/11/2023   AKI (acute kidney injury) (HCC) 11/25/2022   Acute renal failure superimposed on stage 3b chronic kidney disease (HCC) 11/23/2022   Lactic acidosis 11/23/2022   Aortic atherosclerosis (HCC) 11/20/2022   Pleural effusion    Dysphagia    Chronic pain 03/27/2022   Permanent atrial fibrillation (HCC) 01/29/2021   Secondary hypercoagulable state (HCC) 01/29/2021   SIRS (systemic inflammatory response syndrome) (  HCC) 10/31/2020   Abdominal pain 10/31/2020   GERD (gastroesophageal reflux disease) 10/31/2020   Chronic diastolic CHF (congestive heart failure) (HCC) 10/31/2020   Chronic atrial fibrillation (HCC) 07/11/2020   Campylobacter gastrointestinal tract infection 01/03/2019   Chronic kidney disease (CKD), stage III (moderate) 01/02/2019   Hypoalbuminemia 01/02/2019   Bandemia without diagnosis of specific infection 01/02/2019   Hypotension 01/02/2019   Bacteria in urine 01/01/2019   Myogenic ptosis of bilateral eyelids 06/24/2016   HTN (hypertension)    Tremor    Exudative age-related macular degeneration of right eye with active choroidal neovascularization (HCC) 08/29/2014   Pseudophakia of both eyes 08/29/2014   OSA (obstructive sleep apnea) 06/29/2012   Type 2 diabetes mellitus (HCC)    Incidental lung nodule  03/10/2009   Other diseases of lung, not elsewhere classified 03/10/2009   s/p Procedure(s): LAPAROSCOPIC RIGHT HEMICOLECTOMY 10/08/2023  - Doing well - Acute blood loss anemia - received PRBC yesterday. Hg stable.  - AKI-improved - [facility] following for assistance and comorbidity management - appreciate their help in her care. - Advance to carb modified diet - Ambulate-PT/OT ordered - PPX: SCDs; resume eliquis tonight and recheck am labs    LOS: 3 days    Berna Bue MD, FACS General, Bariatric, & Minimally Invasive Surgery [facility] Surgery,  A [facility]

## 2023-10-11 NOTE — Progress Notes (Signed)
Physical Therapy Treatment Patient Details Name: Anne Roach MRN: [id number] DOB: 08/17/1933 Today's Date: 10/11/2023   History of Present Illness Lyris KRISTIA SHERRATT is an 89 y.o. female s/p laparoscopic R hemicolectomy 11/21. PMH: afib, gout, CHF, colon cancer, HTN, diabetes.    PT Comments  Pt agreeable to OOB with encouragement. Assisted to [facility] and with very short distance amb in room-no device, pt declined further activity d/t R knee pain.  Will continue efforts to mobilize   If plan is discharge home, recommend the following: A little help with walking and/or transfers;A little help with bathing/dressing/bathroom;Assistance with cooking/housework;Assist for transportation;Help with stairs or ramp for entrance   Can travel by private vehicle        Equipment Recommendations  None recommended by PT    Recommendations for Other Services       Precautions / Restrictions Precautions Precautions: Fall Restrictions Weight Bearing Restrictions: No     Mobility  Bed Mobility Overal bed mobility: Needs Assistance Bed Mobility: Rolling, Sidelying to Sit Rolling: Supervision Sidelying to sit: HOB elevated, Supervision       General bed mobility comments: cues for log rolling due to abdominal surgery to minimize pain and discomfort    Transfers Overall transfer level: Needs assistance Equipment used: None Transfers: Sit to/from Stand, Bed to chair/wheelchair/BSC Sit to Stand: Contact guard assist   Step pivot transfers: Contact guard assist       General transfer comment: pt powers to stand without AD, CGA for safety. trunk flexed but able to step pivot to BSC, no LOB    Ambulation/Gait Ambulation/Gait assistance: Contact guard assist Gait Distance (Feet): 4 Feet Assistive device: Rolling walker (2 wheels)         General Gait Details: trunk flexed, pt amb a few steps fwd and back, no overt LOB but CGA for safety. pt declines amb greater distance d/t R knee  pain   Stairs             Wheelchair Mobility     Tilt Bed    Modified Rankin (Stroke Patients Only)       Balance                                            Cognition Arousal: Alert Behavior During Therapy: WFL for tasks assessed/performed Overall Cognitive Status: Within Functional Limits for tasks assessed                                          Exercises      General Comments        Pertinent Vitals/Pain Pain Assessment Pain Assessment: Faces Faces Pain Scale: Hurts even more Pain Location: R knee Pain Descriptors / Indicators: Aching, Sore Pain Intervention(s): Limited activity within patient's tolerance, Monitored during session, Repositioned    Home Living                          Prior Function            PT Goals (current goals can now be found in the care plan section) Acute Rehab PT Goals Patient Stated Goal: return home, agreeable to [facility] therapy PT Goal Formulation: With patient/family Time For Goal Achievement: 10/23/23 Potential to Achieve Goals:  Good Progress towards PT goals: Progressing toward goals (ltd d/t R knee pain)    Frequency    Min 1X/week      PT Plan      Co-evaluation              AM-PAC PT "6 Clicks" Mobility   Outcome Measure  Help needed turning from your back to your side while in a flat bed without using bedrails?: A Little Help needed moving from lying on your back to sitting on the side of a flat bed without using bedrails?: A Little Help needed moving to and from a bed to a chair (including a wheelchair)?: A Little Help needed standing up from a chair using your arms (e.g., wheelchair or bedside chair)?: A Little Help needed to walk in hospital room?: A Little Help needed climbing 3-5 steps with a railing? : A Lot 6 Click Score: 17    End of Session   Activity Tolerance: Patient tolerated treatment well Patient left: in chair;with call  bell/phone within reach (no alarm on arrivial)   PT Visit Diagnosis: Other abnormalities of gait and mobility (R26.89);Pain Pain - Right/Left: Right Pain - part of body: Knee     Time: 1101-1117 PT Time Calculation (min) (ACUTE ONLY): 16 min  Charges:    $Gait Training: 8-22 mins PT General Charges $$ ACUTE PT VISIT: 1 Visit                     Maryela Tapper, PT  Acute Rehab Dept [facility]) [phone]  10/11/2023    [facility] 10/11/2023, 11:26 AM

## 2023-10-12 DIAGNOSIS — Z9049 Acquired absence of other specified parts of digestive tract: Secondary | ICD-10-CM | POA: Diagnosis not present

## 2023-10-12 DIAGNOSIS — D62 Acute posthemorrhagic anemia: Secondary | ICD-10-CM | POA: Diagnosis not present

## 2023-10-12 DIAGNOSIS — E1159 Type 2 diabetes mellitus with other circulatory complications: Secondary | ICD-10-CM | POA: Diagnosis not present

## 2023-10-12 DIAGNOSIS — I5032 Chronic diastolic (congestive) heart failure: Secondary | ICD-10-CM | POA: Diagnosis not present

## 2023-10-12 LAB — BASIC METABOLIC PANEL
Anion gap: 8 (ref 5–15)
BUN: 14 mg/dL (ref 8–23)
CO2: 25 mmol/L (ref 22–32)
Calcium: 8.6 mg/dL — ABNORMAL LOW (ref 8.9–10.3)
Chloride: 110 mmol/L (ref 98–111)
Creatinine, Ser: 0.95 mg/dL (ref 0.44–1.00)
GFR, Estimated: 58 mL/min — ABNORMAL LOW (ref >60)
Glucose, Bld: 108 mg/dL — ABNORMAL HIGH (ref 70–99)
Potassium: 4 mmol/L (ref 3.5–5.1)
Sodium: 143 mmol/L (ref 135–145)

## 2023-10-12 LAB — CBC
HCT: 29.7 % — ABNORMAL LOW (ref 36.0–46.0)
Hemoglobin: 9.2 g/dL — ABNORMAL LOW (ref 12.0–15.0)
MCH: 30.3 pg (ref 26.0–34.0)
MCHC: 31 g/dL (ref 30.0–36.0)
MCV: 97.7 fL (ref 80.0–100.0)
Platelets: 217 10*3/uL (ref 150–400)
RBC: 3.04 MIL/uL — ABNORMAL LOW (ref 3.87–5.11)
RDW: 15.6 % — ABNORMAL HIGH (ref 11.5–15.5)
WBC: 9.6 10*3/uL (ref 4.0–10.5)
nRBC: 0.4 % — ABNORMAL HIGH (ref 0.0–0.2)

## 2023-10-12 LAB — MAGNESIUM: Magnesium: 2 mg/dL (ref 1.7–2.4)

## 2023-10-12 LAB — SURGICAL PATHOLOGY

## 2023-10-12 NOTE — TOC Transition Note (Signed)
Transition of Care [facility]) - CM/SW Discharge Note   Patient Details  Name: Anne Roach MRN: [id number] Date of Birth: 08/23/1933  Transition of Care [facility]) CM/SW Contact:  Howell Rucks, RN Phone Number: 10/12/2023, 1:16 PM   Clinical Narrative:  DC home order, [facility] for [facility] PT/OT, added to AVS. No further TOC needs identified.      Final next level of care: Home w Home Health Services Barriers to Discharge: Barriers Resolved   Patient Goals and CMS Choice      Discharge Placement                         Discharge Plan and Services Additional resources added to the After Visit Summary for                    DME Agency: [facility]       HH Arranged: PT, OT [facility] Agency: [facility] Date [facility] Agency Contacted: 10/12/23 Time HH Agency Contacted: [phone] Representative spoke with at [facility] Agency: Kandee Keen  Social Determinants of Health (SDOH) Interventions SDOH Screenings   Food Insecurity: No Food Insecurity (10/08/2023)  Housing: Low Risk  (10/08/2023)  Transportation Needs: No Transportation Needs (10/08/2023)  Utilities: Not At Risk (10/08/2023)  Alcohol Screen: Low Risk  (03/28/2022)  Financial Resource Strain: Low Risk  (03/28/2022)  Tobacco Use: Low Risk  (10/08/2023)     Readmission Risk Interventions    10/12/2023    1:15 PM  Readmission Risk Prevention Plan  Transportation Screening Complete  PCP or Specialist Appt within 3-5 Days Complete  HRI or Home Care Consult Complete  Social Work Consult for Recovery Care Planning/Counseling Complete  Palliative Care Screening Not Applicable  Medication Review [employer]) Complete

## 2023-10-12 NOTE — TOC Progression Note (Signed)
Transition of Care [facility]) - Progression Note    Patient Details  Name: Anne Roach MRN: [id number] Date of Birth: 10/04/1933  Transition of Care [facility]) CM/SW Contact  Howell Rucks, RN Phone Number: 10/12/2023, 9:21 AM  Clinical Narrative:  PT eval completed, recommendation for [facility] PT/OT. Teams chat to attending to enter [facility] PT/OT order and place face to face. TOC will continue to follow.      Expected Discharge Plan: Home w Home Health Services Barriers to Discharge: Continued Medical Work up  Expected Discharge Plan and Services       Living arrangements for the past 2 months: Single Family Home                                       Social Determinants of Health (SDOH) Interventions SDOH Screenings   Food Insecurity: No Food Insecurity (10/08/2023)  Housing: Low Risk  (10/08/2023)  Transportation Needs: No Transportation Needs (10/08/2023)  Utilities: Not At Risk (10/08/2023)  Alcohol Screen: Low Risk  (03/28/2022)  Financial Resource Strain: Low Risk  (03/28/2022)  Tobacco Use: Low Risk  (10/08/2023)    Readmission Risk Interventions     No data to display

## 2023-10-12 NOTE — Care Management Important Message (Signed)
Important Message  Patient Details IM Letter given Name: Anne Roach MRN: [id number] Date of Birth: 1932-12-21   Important Message Given:  Yes - Medicare IM     Caren Macadam 10/12/2023, 12:48 PM

## 2023-10-12 NOTE — Plan of Care (Signed)
  Problem: Bowel/Gastric: Goal: Gastrointestinal status for postoperative course will improve Outcome: Adequate for Discharge   Problem: Nutritional: Goal: Will attain and maintain optimal nutritional status will improve Outcome: Adequate for Discharge

## 2023-10-12 NOTE — Progress Notes (Signed)
Patient stood up after using the [facility], she was going to go for a walk but became very dizzy and had to sit back down. Bp was taken 111/69. Patient sitting in chair with daughter in room.

## 2023-10-12 NOTE — Progress Notes (Addendum)
Subjective Received blood Saturday with appropriate response. Still w Bms, no blood. No nausea. Some R sided abd pain.  Daughter at [facility] this morning  Objective: Vital signs in last 24 hours: Temp:  [97.8 F (36.6 C)-98.1 F (36.7 C)] 98.1 F (36.7 C) (11/25 0541) Pulse Rate:  [89-102] 102 (11/25 0541) Resp:  [16-18] 18 (11/25 0541) BP: (122-143)/(69-76) 122/76 (11/25 0541) SpO2:  [96 %-97 %] 96 % (11/25 0541) Last BM Date : 10/11/23  Intake/Output from previous day: 11/24 0701 - 11/25 0700 In: 780 [P.O.:780] Out: 450 [Urine:450] Intake/Output this shift: No intake/output data recorded.  Gen: NAD, comfortable CV: RRR Pulm: Normal work of breathing Abd: Soft, nondistended, mildly tender in R hemiabdomen. Incisions-clean/dry/intact.  Some ecchymoses.  No erythema.  No drainage. Ext: SCDs in place  Lab Results: CBC  Recent Labs    10/11/23 0435 10/12/23 0403  WBC 9.1 9.6  HGB 8.9* 9.2*  HCT 29.4* 29.7*  PLT 200 217      Latest Ref Rng & Units 10/12/2023    4:03 AM 10/11/2023    4:35 AM 10/10/2023    9:10 PM  CBC  WBC 4.0 - 10.5 K/uL 9.6  9.1    Hemoglobin 12.0 - 15.0 g/dL 9.2  8.9  8.6   Hematocrit 36.0 - 46.0 % 29.7  29.4  29.0   Platelets 150 - 400 K/uL 217  200       BMET Recent Labs    10/11/23 0435 10/12/23 0403  NA 138 143  K 3.5 4.0  CL 106 110  CO2 25 25  GLUCOSE 95 108*  BUN 16 14  CREATININE 0.96 0.95  CALCIUM 8.1* 8.6*   PT/INR No results for input(s): "LABPROT", "INR" in the last 72 hours. ABG No results for input(s): "PHART", "HCO3" in the last 72 hours.  Invalid input(s): "PCO2", "PO2"  Studies/Results:  Anti-infectives: Anti-infectives (From admission, onward)    Start     Dose/Rate Route Frequency Ordered Stop   10/08/23 1400  neomycin (MYCIFRADIN) tablet 1,000 mg  Status:  Discontinued       Placed in "And" Linked Group   1,000 mg Oral 3 times per day 10/08/23 0547 10/08/23 0551   10/08/23 1400  metroNIDAZOLE (FLAGYL)  tablet 1,000 mg  Status:  Discontinued       Placed in "And" Linked Group   1,000 mg Oral 3 times per day 10/08/23 0547 10/08/23 0551   10/08/23 0600  cefoTEtan (CEFOTAN) 2 g in sodium chloride 0.9 % 100 mL IVPB        2 g 200 mL/hr over 30 Minutes Intravenous On call to O.R. 10/08/23 0547 10/08/23 0814        Assessment/Plan: Patient Active Problem List   Diagnosis Date Noted   Symptomatic anemia 10/10/2023   Acute postoperative anemia due to expected blood loss 10/09/2023   S/P right hemicolectomy 10/08/2023   Cancer of right colon (HCC) 08/11/2023   AKI (acute kidney injury) (HCC) 11/25/2022   Acute renal failure superimposed on stage 3b chronic kidney disease (HCC) 11/23/2022   Lactic acidosis 11/23/2022   Aortic atherosclerosis (HCC) 11/20/2022   Pleural effusion    Dysphagia    Chronic pain 03/27/2022   Permanent atrial fibrillation (HCC) 01/29/2021   Secondary hypercoagulable state (HCC) 01/29/2021   SIRS (systemic inflammatory response syndrome) (HCC) 10/31/2020   Abdominal pain 10/31/2020   GERD (gastroesophageal reflux disease) 10/31/2020   Chronic diastolic CHF (congestive heart failure) (HCC) 10/31/2020   Chronic atrial  fibrillation (HCC) 07/11/2020   Campylobacter gastrointestinal tract infection 01/03/2019   Chronic kidney disease (CKD), stage III (moderate) 01/02/2019   Hypoalbuminemia 01/02/2019   Bandemia without diagnosis of specific infection 01/02/2019   Hypotension 01/02/2019   Bacteria in urine 01/01/2019   Myogenic ptosis of bilateral eyelids 06/24/2016   HTN (hypertension)    Tremor    Exudative age-related macular degeneration of right eye with active choroidal neovascularization (HCC) 08/29/2014   Pseudophakia of both eyes 08/29/2014   OSA (obstructive sleep apnea) 06/29/2012   Type 2 diabetes mellitus (HCC)    Incidental lung nodule 03/10/2009   Other diseases of lung, not elsewhere classified 03/10/2009   s/p Procedure(s): LAPAROSCOPIC  RIGHT HEMICOLECTOMY 10/08/2023  - Doing well - Acute blood loss anemia - received PRBC 11/23. Hg stable.  - AKI- resolved - [facility] following for assistance and comorbidity management - appreciate their help in her care. - Diet as tolerated - Ambulate-PT/OT ordered; Home health in the works - PPX: SCDs; Eliquis  - Possible discharge later today if HH in place; reviewed with CM/SW today    LOS: 4 days    Andria Meuse MD, [facility],  A [facility]

## 2023-10-12 NOTE — Plan of Care (Signed)
Problem: Activity: Goal: Ability to tolerate increased activity will improve Outcome: Progressing   Problem: Bowel/Gastric: Goal: Gastrointestinal status for postoperative course will improve Outcome: Progressing   Problem: Health Behavior/Discharge Planning: Goal: Identification of community resources to assist with postoperative recovery needs will improve Outcome: Progressing

## 2023-10-12 NOTE — Progress Notes (Signed)
OT Cancellation Note  Patient Details Name: Anne Roach MRN: [id number] DOB: May 27, 1933   Cancelled Treatment:    Reason Eval/Treat Not Completed: Patient declined, no reason specified. Pt stating she has been up walking today, and requests to rest. OT will re-attempt as able.   Otila Starn L. Gunhild Bautch, OTR/L  10/12/23, 1:37 PM

## 2023-10-12 NOTE — Plan of Care (Signed)
  Problem: Education: Goal: Understanding of discharge needs will improve Outcome: Adequate for Discharge Goal: Verbalization of understanding of the causes of altered bowel function will improve Outcome: Adequate for Discharge   Problem: Activity: Goal: Ability to tolerate increased activity will improve 10/12/2023 1657 by Amil Amen, RN Outcome: Adequate for Discharge 10/12/2023 1612 by Amil Amen, RN Outcome: Progressing   Problem: Bowel/Gastric: Goal: Gastrointestinal status for postoperative course will improve 10/12/2023 1657 by Amil Amen, RN Outcome: Adequate for Discharge 10/12/2023 1612 by Amil Amen, RN Outcome: Progressing   Problem: Health Behavior/Discharge Planning: Goal: Identification of community resources to assist with postoperative recovery needs will improve 10/12/2023 1657 by Amil Amen, RN Outcome: Adequate for Discharge 10/12/2023 1612 by Amil Amen, RN Outcome: Progressing   Problem: Nutritional: Goal: Will attain and maintain optimal nutritional status will improve Outcome: Adequate for Discharge   Problem: Clinical Measurements: Goal: Postoperative complications will be avoided or minimized Outcome: Adequate for Discharge   Problem: Respiratory: Goal: Respiratory status will improve Outcome: Adequate for Discharge   Problem: Skin Integrity: Goal: Will show signs of wound healing Outcome: Adequate for Discharge   Problem: Education: Goal: Knowledge of General Education information will improve Description: Including pain rating scale, medication(s)/side effects and non-pharmacologic comfort measures Outcome: Adequate for Discharge   Problem: Health Behavior/Discharge Planning: Goal: Ability to manage health-related needs will improve Outcome: Adequate for Discharge   Problem: Clinical Measurements: Goal: Ability to maintain clinical measurements within normal limits will improve Outcome: Adequate for Discharge Goal: Will  remain free from infection Outcome: Adequate for Discharge Goal: Diagnostic test results will improve Outcome: Adequate for Discharge Goal: Respiratory complications will improve Outcome: Adequate for Discharge Goal: Cardiovascular complication will be avoided Outcome: Adequate for Discharge   Problem: Activity: Goal: Risk for activity intolerance will decrease Outcome: Adequate for Discharge   Problem: Nutrition: Goal: Adequate nutrition will be maintained Outcome: Adequate for Discharge   Problem: Coping: Goal: Level of anxiety will decrease Outcome: Adequate for Discharge   Problem: Elimination: Goal: Will not experience complications related to bowel motility Outcome: Adequate for Discharge Goal: Will not experience complications related to urinary retention Outcome: Adequate for Discharge   Problem: Pain Management: Goal: General experience of comfort will improve Outcome: Adequate for Discharge   Problem: Safety: Goal: Ability to remain free from injury will improve Outcome: Adequate for Discharge   Problem: Skin Integrity: Goal: Risk for impaired skin integrity will decrease Outcome: Adequate for Discharge

## 2023-10-12 NOTE — Progress Notes (Signed)
PROGRESS NOTE/consult    Anne Roach  ZOX:[id number] DOB: 03/08/1933 DOA: 10/08/2023 PCP: Leilani Able, MD    No chief complaint on file.   Brief Narrative:  Patient is a 89 year old female history of hypertension, chronic diastolic CHF, non-insulin-dependent type 2 diabetes, hypertension, A-fib on chronic anticoagulation with Eliquis who underwent laparoscopic right hemicolectomy per primary team, Dr. Cliffton Asters with general surgery for cecal colon cancer and hospitalist were consulted for medical management of chronic medical issues.   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  2.  Postoperative acute blood loss anemia/symptomatic anemia -Patient denies any overt bleeding. -Hemoglobin at 9.2 after transfusion of 1 unit PRBCs (10/10/2023 ) from 7.3 from 7.4.  -Resolution of dizziness after transfusion of PRBC. -Anemia panel with a iron level of 59, TIBC of 230, ferritin of 30, folate >40. -Due to patient's cardiac history transfusion threshold for hemoglobin <8. -Transfusion offered to patient and family on 10/09/2023, however daughter felt hemoglobin had not equilibrated yet and requested to hold off on transfusion. -Eliquis resumed per primary team on 10/11/2023.   -Follow H&H. -Transfusion threshold hemoglobin < 8.   3.  Hypertension -Patient currently on Toprol-XL and propranolol which we will continue due to history of A-fib.   -Patient with complaints of dizziness on 10/10/2023 and as such diuretics were discontinued. -Patient status post transfusion 1 unit PRBCs with resolution of dizziness. -BP  stable. -Follow.  4.  Atrial fibrillation -Continue Toprol-XL and propranolol for rate control. -Eliquis resumed on 10/11/2023.   5.  Chronic diastolic CHF -Patient currently euvolemic and stable. -Patient with complaints of dizziness on 10/10/2023 status post transfusion 1 unit PRBCs with resolution of dizziness.. -Continue beta-blocker.  -Diuretics held yesterday we will continue to hold and could likely resume on discharge.  6.  Well-controlled diabetes mellitus type 2 -Hemoglobin A1c 5.3 (09/30/2023) -Diet has been advanced to carb modified diet. -Blood glucose of 108 on morning labs today.  -Continue to hold home regimen Jardiance -Outpatient follow-up with PCP.  7.  Dementia -Aricept.    8.  Right knee pain -Continue Voltaren gel 4 times daily.     DVT prophylaxis: Per primary team Code Status: Full Family Communication: Updated patient and daughter at bedside. Disposition: Per primary team.  Status is: Inpatient Remains inpatient appropriate because: Severity of illness   Consultants:  [facility] hospitalist: Dr. Kirby Crigler 10/08/2023  Procedures:  Laparoscopic right hemicolectomy with lysis of adhesions x 25 minutes/partial omentectomy per Dr. Cliffton Asters General Surgery 10/08/2023 Transfusion 1 unit PRBCs 10/10/2023.  Antimicrobials:  Anti-infectives (From admission, onward)    Start     Dose/Rate Route Frequency Ordered Stop   10/08/23 1400  neomycin (MYCIFRADIN) tablet 1,000 mg  Status:  Discontinued       Placed in "And" Linked Group   1,000 mg Oral 3 times per day 10/08/23 0547 10/08/23 0551   10/08/23 1400  metroNIDAZOLE (FLAGYL) tablet 1,000 mg  Status:  Discontinued       Placed in "And" Linked Group   1,000 mg Oral 3 times per day 10/08/23 0547 10/08/23 0551   10/08/23 0600  cefoTEtan (CEFOTAN) 2 g in sodium  chloride 0.9 % 100 mL IVPB        2 g 200 mL/hr over 30 Minutes Intravenous On call to O.R. 10/08/23 0547 10/08/23 0814          Subjective: Sitting up in recliner.  Noted to have some dizziness early on in the morning which has since resolved.  Per daughter patient ambulated in hallway with therapy.  Patient denies any chest pain or shortness of breath.  Some complaints of abdominal pain that has improved.    Objective: Vitals:   10/11/23 1354 10/11/23 2215 10/12/23 0541 10/12/23 1110  BP: (!) 140/69 (!) 143/73 122/76 111/69  Pulse:  89 (!) 102 90  Resp: 16 18 18 18   Temp: 97.8 F (36.6 C) 97.8 F (36.6 C) 98.1 F (36.7 C) 98 F (36.7 C)  TempSrc: Oral Oral Oral Oral  SpO2: 97% 97% 96% 94%  Weight:      Height:        Intake/Output Summary (Last 24 hours) at 10/12/2023 1307 Last data filed at 10/12/2023 0939 Gross per 24 hour  Intake 660 ml  Output 451 ml  Net 209 ml   Filed Weights   10/08/23 0544 10/09/23 0614 10/11/23 0500  Weight: 70.3 kg 75.9 kg 77 kg    Examination:  General exam: NAD. Respiratory system: Clear to auscultation bilaterally anterior lung fields.  No wheezes, no crackles, no rhonchi.  Fair air movement.  Speaking in full sentences.   Cardiovascular system: Regular rate rhythm no murmurs rubs or gallops.  No JVD.  No pitting lower extremity edema.   Gastrointestinal system: Abdomen is soft, nondistended, decreasing diffuse tenderness to palpation.  Positive bowel sounds.  No rebound.  No guarding.   Incision site c/d/I.  Central nervous system: Alert and oriented. No focal neurological deficits. Extremities: Symmetric 5 x 5 power. Skin: No rashes, lesions or ulcers Psychiatry: Judgement and insight appear normal. Mood & affect appropriate.     Data Reviewed: I have personally reviewed following labs and imaging studies  CBC: Recent Labs  Lab 10/09/23 0503 10/09/23 1123 10/10/23 0844 10/10/23 2110 10/11/23 0435 10/12/23 0403  WBC 10.1 10.8* 8.7  --  9.1 9.6  NEUTROABS  --  7.2  --   --   --   --   HGB 7.4* 7.3* 7.3* 8.6* 8.9* 9.2*  HCT 25.0* 23.2* 24.1* 29.0*  29.4* 29.7*  MCV 100.0 98.7 101.3*  --  98.0 97.7  PLT 194 195 183  --  200 217    Basic Metabolic Panel: Recent Labs  Lab 10/09/23 0503 10/10/23 0844 10/11/23 0435 10/12/23 0403  NA 138 140 138 143  K 4.3 3.8 3.5 4.0  CL 106 108 106 110  CO2 23 24 25 25   GLUCOSE 114* 97 95 108*  BUN 19 14 16 14   CREATININE 1.37* 1.10* 0.96 0.95  CALCIUM 8.5* 8.1* 8.1* 8.6*  MG  --   --   --  2.0    GFR: Estimated Creatinine Clearance: 42 mL/min (by C-G formula based on SCr of 0.95 mg/dL).  Liver Function Tests: No results for input(s): "AST", "ALT", "ALKPHOS", "BILITOT", "PROT", "ALBUMIN" in the last 168 hours.  CBG: Recent Labs  Lab 10/08/23 0547 10/08/23 1219 10/08/23 1536  GLUCAP 115* 147* 133*     No results found for this or any previous visit (from the past 240 hour(s)).       Radiology Studies: No results found.      Scheduled Meds:  acetaminophen  1,000 mg Oral Q6H   allopurinol  100 mg Oral Daily   apixaban  5 mg Oral BID   ascorbic acid  500 mg Oral BID WC   cholecalciferol  5,000 Units Oral Q breakfast   diclofenac Sodium  2 g Topical QID   donepezil  5 mg Oral QHS   feeding supplement  237 mL Oral BID BM   ferrous sulfate  325 mg Oral BID WC   folic acid  1,000 mcg Oral Daily   furosemide  20 mg Intravenous Once   gabapentin  400 mg Oral TID   influenza vaccine adjuvanted  0.5 mL Intramuscular Tomorrow-1000   metoprolol succinate  50 mg Oral Daily   potassium chloride SA  20 mEq Oral Daily   propranolol  20 mg Oral BID   Continuous Infusions:      LOS: 4 days    Time spent: 35 minutes    Ramiro Harvest, MD [facility]   To contact the attending provider between 7A-7P or the covering provider during after hours 7P-7A, please log into the web site [email or web address] and access using universal [place] password for that web site. If you do not have the password, please call the hospital operator.  10/12/2023, 1:07 PM

## 2023-10-12 NOTE — Progress Notes (Signed)
Physical Therapy Treatment Patient Details Name: Anne Roach MRN: [id number] DOB: 01/14/1933 Today's Date: 10/12/2023   History of Present Illness Anne Roach is an 89 y.o. female s/p laparoscopic R hemicolectomy 11/21. PMH: afib, gout, CHF, colon cancer, HTN, diabetes.    PT Comments  Excellent progress this session, incr gait distance/tolerance; pt denies any dizziness (per dtr pt was dizzy when getting up to recliner--BP 112/67 prior to standing), R knee painful with gait however improved from 11/24. Pt hopeful to d/c home today3, dtr present for session   If plan is discharge home, recommend the following: A little help with walking and/or transfers;A little help with bathing/dressing/bathroom;Assistance with cooking/housework;Assist for transportation;Help with stairs or ramp for entrance   Can travel by private vehicle        Equipment Recommendations  None recommended by PT    Recommendations for Other Services       Precautions / Restrictions Precautions Precautions: Fall Restrictions Weight Bearing Restrictions: No     Mobility  Bed Mobility               General bed mobility comments: pt in recliner    Transfers Overall transfer level: Needs assistance Equipment used: None Transfers: Sit to/from Stand Sit to Stand: Contact guard assist           General transfer comment: pt powers to stand with CGA for safety. trunk flexed, cues for hand placement    Ambulation/Gait Ambulation/Gait assistance: Contact guard assist Gait Distance (Feet): 140 Feet Assistive device: Rolling walker (2 wheels) Gait Pattern/deviations: Step-through pattern, Decreased stride length, Trunk flexed       General Gait Details: verbal cues for proximitiy to RW, to keep feet inside RW with turns   Stairs             Wheelchair Mobility     Tilt Bed    Modified Rankin (Stroke Patients Only)       Balance                                             Cognition Arousal: Alert Behavior During Therapy: WFL for tasks assessed/performed Overall Cognitive Status: Within Functional Limits for tasks assessed                                 General Comments: dtr present, translating at times--pt states she understands everything PT is saying        Exercises Other Exercises Other Exercises: attempted RLE heel slides, knee too painful per pt    General Comments        Pertinent Vitals/Pain Pain Assessment Pain Assessment: Faces Faces Pain Scale: Hurts even more Pain Location: R knee wtih activity Pain Descriptors / Indicators: Aching, Sore, Grimacing Pain Intervention(s): Limited activity within patient's tolerance, Monitored during session, Repositioned    Home Living                          Prior Function            PT Goals (current goals can now be found in the care plan section) Acute Rehab PT Goals Patient Stated Goal: return home, agreeable to [facility] therapy PT Goal Formulation: With patient/family Time For Goal Achievement: 10/23/23 Potential to Achieve Goals: Good Progress  towards PT goals: Progressing toward goals    Frequency    Min 1X/week      PT Plan      Co-evaluation              AM-PAC PT "6 Clicks" Mobility   Outcome Measure  Help needed turning from your back to your side while in a flat bed without using bedrails?: A Little Help needed moving from lying on your back to sitting on the side of a flat bed without using bedrails?: A Little Help needed moving to and from a bed to a chair (including a wheelchair)?: A Little Help needed standing up from a chair using your arms (e.g., wheelchair or bedside chair)?: A Little Help needed to walk in hospital room?: A Little Help needed climbing 3-5 steps with a railing? : A Lot 6 Click Score: 17    End of Session Equipment Utilized During Treatment: Gait belt Activity Tolerance: Patient tolerated  treatment well Patient left: in chair;with call bell/phone within reach;with family/visitor present (no alarm on arrivial)   PT Visit Diagnosis: Other abnormalities of gait and mobility (R26.89);Pain Pain - Right/Left: Right Pain - part of body: Knee     Time: 1150-1207 PT Time Calculation (min) (ACUTE ONLY): 17 min  Charges:    $Gait Training: 8-22 mins PT General Charges $$ ACUTE PT VISIT: 1 Visit                     Netanel Yannuzzi, PT  Acute Rehab Dept [facility]) [phone]  10/12/2023    [facility] 10/12/2023, 12:13 PM

## 2023-10-13 LAB — TYPE AND SCREEN
ABO/RH(D): B POS
Antibody Screen: NEGATIVE
Unit division: 0
Unit division: 0

## 2023-10-13 LAB — BPAM RBC
Blood Product Expiration Date: 202412102359
Blood Product Expiration Date: 202412192359
ISSUE DATE / TIME: 202411231541
Unit Type and Rh: 7300
Unit Type and Rh: 7300

## 2023-10-13 NOTE — Discharge Summary (Signed)
Patient ID: Anne Roach MRN: [id number] DOB/AGE: 87/08/1933 87 y.o.  Admit date: 10/08/2023 Discharge date: 10/12/2023  Discharge Diagnoses Patient Active Problem List   Diagnosis Date Noted   Symptomatic anemia 10/10/2023   Acute postoperative anemia due to expected blood loss 10/09/2023   S/P right hemicolectomy 10/08/2023   Cancer of right colon (HCC) 08/11/2023   AKI (acute kidney injury) (HCC) 11/25/2022   Acute renal failure superimposed on stage 3b chronic kidney disease (HCC) 11/23/2022   Lactic acidosis 11/23/2022   Aortic atherosclerosis (HCC) 11/20/2022   Pleural effusion    Dysphagia    Chronic pain 03/27/2022   Permanent atrial fibrillation (HCC) 01/29/2021   Secondary hypercoagulable state (HCC) 01/29/2021   SIRS (systemic inflammatory response syndrome) (HCC) 10/31/2020   Abdominal pain 10/31/2020   GERD (gastroesophageal reflux disease) 10/31/2020   Chronic diastolic CHF (congestive heart failure) (HCC) 10/31/2020   Chronic atrial fibrillation (HCC) 07/11/2020   Campylobacter gastrointestinal tract infection 01/03/2019   Chronic kidney disease (CKD), stage III (moderate) 01/02/2019   Hypoalbuminemia 01/02/2019   Bandemia without diagnosis of specific infection 01/02/2019   Hypotension 01/02/2019   Bacteria in urine 01/01/2019   Myogenic ptosis of bilateral eyelids 06/24/2016   HTN (hypertension)    Tremor    Exudative age-related macular degeneration of right eye with active choroidal neovascularization (HCC) 08/29/2014   Pseudophakia of both eyes 08/29/2014   OSA (obstructive sleep apnea) 06/29/2012   Type 2 diabetes mellitus (HCC)    Incidental lung nodule 03/10/2009   Other diseases of lung, not elsewhere classified 03/10/2009    Consultants [facility] (internal medicine)  Procedures OR 10/08/23 Laparoscopic right hemicolectomy with lysis of adhesions x 25 minutes Partial omentectomy  FINDINGS:  Normal liver surface.  Normal  peritoneal surfaces.  Evident mass in the cecum with ileocolic adenopathy.  Omental containing adhesions across the upper midline likely related to prior surgery.  Laparoscopic right hemicolectomy was carried out uneventfully.  Partial omentectomy also necessary as there were multiple tongues of omentum which were quite oozy from the adhesiolysis and this was necessary for hemostasis purposes.  Hospital Course: Admitted postoperatively and recovered well. [facility] consulted for assistance in comorbidity management. Her diet was sequentially advanced and she tolerated this well. She received a unit of PRBC for acute blood loss anemia 10/10/23 with appropriate response. On 10/12/23 she was tolerating diet, having non-bloody Bms, hgb stable and home health PT + OT arranged. She and her daughter are comfortable with and she is stable for discharge home. Postoperative expectations reviewed, questions welcomed and answered. She expressed understanding and agreement with the plan. Follow-up in my office arranged.     Allergies as of 10/12/2023   No Known Allergies      Medication List     TAKE these medications    Accu-Chek Aviva Plus test strip Generic drug: glucose blood   acetaminophen 500 MG tablet Commonly known as: TYLENOL Take 1,000 mg by mouth every 6 (six) hours as needed for mild pain or headache.   allopurinol 100 MG tablet Commonly known as: ZYLOPRIM TAKE 1 TABLET(100 MG) BY MOUTH DAILY   donepezil 5 MG tablet Commonly known as: ARICEPT Take 5 mg by mouth at bedtime.   Eliquis 5 MG Tabs tablet Generic drug: apixaban TAKE 1 TABLET(5 MG) BY MOUTH TWICE DAILY   empagliflozin 10 MG Tabs tablet Commonly known as: JARDIANCE Take 1 tablet (10 mg total) by mouth daily.   ferrous sulfate 325 (65 FE) MG EC tablet Take  325 mg by mouth every other day.   fluticasone 50 MCG/ACT nasal spray Commonly known as: FLONASE Place 1 spray into both nostrils daily.   folic acid 800 MCG  tablet Commonly known as: FOLVITE Take 800 mcg by mouth daily.   furosemide 40 MG tablet Commonly known as: LASIX TAKE 1 TABLET(40 MG) BY MOUTH DAILY   Fusion Plus Caps Take 1 capsule by mouth every other day.   gabapentin 400 MG capsule Commonly known as: NEURONTIN Take 400 mg by mouth 3 (three) times daily.   ICAPS AREDS 2 PO Take 1 capsule by mouth 2 (two) times daily.   lidocaine 2 % solution Commonly known as: XYLOCAINE Use as directed 10 mLs in the mouth or throat every 6 (six) hours as needed (stomach pain).   loratadine 10 MG tablet Commonly known as: CLARITIN Take 10 mg by mouth daily as needed for allergies.   metoprolol succinate 50 MG 24 hr tablet Commonly known as: TOPROL-XL Take 1 tablet (50 mg total) by mouth daily. Take with or immediately following a meal.   Potassium Chloride ER 20 MEQ Tbcr Take 20 mEq by mouth daily.   propranolol 20 MG tablet Commonly known as: INDERAL Take 20 mg by mouth 2 (two) times daily.   traMADol 50 MG tablet Commonly known as: Ultram Take 1 tablet (50 mg total) by mouth every 6 (six) hours as needed for up to 5 days (postop pain not controlled with tylenol first). What changed:  when to take this reasons to take this   Vitamin D 125 MCG (5000 UT) Caps Take 5,000 Units by mouth daily with breakfast.          Follow-up Information     Andria Meuse, MD Follow up on 10/26/2023.   Specialties: General Surgery, Colon and Rectal Surgery Why: Please arrive by 9:15 am Contact information: [address] [place] [address] [phone]         Care, [facility] Follow up.   Specialty: Home Health Services Why: Home Health Physical Therapy and Occupational Therapy Contact information: [address] [place] [address] [phone]                 Stephanie Coup. Cliffton Asters, M.D. [facility]

## 2023-10-23 ENCOUNTER — Other Ambulatory Visit: Payer: Self-pay | Admitting: Cardiovascular Disease

## 2023-10-27 ENCOUNTER — Telehealth: Payer: Self-pay | Admitting: Cardiovascular Disease

## 2023-10-27 NOTE — Telephone Encounter (Signed)
Increase falls due to report of dizziness and lightheadedness. BP 90/60 (seated) and 70/40 (standing). Calling to see if she needs to be seen. Please advise

## 2023-10-27 NOTE — Telephone Encounter (Signed)
[facility] Nurse, Tasia Catchings,  back about message. Tasia Catchings, reported patient having irregular pulse. Patient has been having dizziness and lightheadedness. Patient has had several falls and she is at risk for falls with her low BP and with it dropping when standing. Patient is on metoprolol succinate 50 mg daily, furosemide 40 mg daily, and propranolol 20 mg BID, which is used for tremors. Patient has no edema at this time. Encouraged daughter to take BP in the morning and if still low to hold metoprolol and furosemide. Got patient in to see A. FIB clinic on Thursday morning, to get evaluated. Tired to get patient in at her regular cardiologist office, but there are no openings. Encouraged patient's daughter to have patient drink plenty of fluids and can try eating a small salty snack to see if BP will come up. Will send message to Dr. Eden Emms for further advisement.

## 2023-10-29 ENCOUNTER — Ambulatory Visit (HOSPITAL_COMMUNITY)
Admission: RE | Admit: 2023-10-29 | Discharge: 2023-10-29 | Disposition: A | Discharge status: Home / Self Care | Payer: Medicare HMO | Source: Ambulatory Visit | Attending: Physician Assistant | Admitting: Physician Assistant

## 2023-10-29 ENCOUNTER — Encounter (HOSPITAL_COMMUNITY): Payer: Self-pay | Admitting: Physician Assistant

## 2023-10-29 VITALS — BP 106/64 | HR 98 | Ht 65.0 in | Wt 152.6 lb

## 2023-10-29 DIAGNOSIS — I4811 Longstanding persistent atrial fibrillation: Secondary | ICD-10-CM | POA: Diagnosis present

## 2023-10-29 DIAGNOSIS — I5032 Chronic diastolic (congestive) heart failure: Secondary | ICD-10-CM | POA: Diagnosis not present

## 2023-10-29 DIAGNOSIS — G4733 Obstructive sleep apnea (adult) (pediatric): Secondary | ICD-10-CM | POA: Diagnosis not present

## 2023-10-29 DIAGNOSIS — Z7901 Long term (current) use of anticoagulants: Secondary | ICD-10-CM | POA: Insufficient documentation

## 2023-10-29 DIAGNOSIS — Z79899 Other long term (current) drug therapy: Secondary | ICD-10-CM | POA: Diagnosis not present

## 2023-10-29 DIAGNOSIS — I10 Essential (primary) hypertension: Secondary | ICD-10-CM

## 2023-10-29 DIAGNOSIS — R251 Tremor, unspecified: Secondary | ICD-10-CM | POA: Diagnosis not present

## 2023-10-29 DIAGNOSIS — I4821 Permanent atrial fibrillation: Secondary | ICD-10-CM | POA: Insufficient documentation

## 2023-10-29 DIAGNOSIS — D6869 Other thrombophilia: Secondary | ICD-10-CM | POA: Insufficient documentation

## 2023-10-29 DIAGNOSIS — E119 Type 2 diabetes mellitus without complications: Secondary | ICD-10-CM | POA: Insufficient documentation

## 2023-10-29 DIAGNOSIS — I110 Hypertensive heart disease with heart failure: Secondary | ICD-10-CM | POA: Insufficient documentation

## 2023-10-29 NOTE — Patient Instructions (Addendum)
Follow up with Dr Fabio Bering office in 2 weeks

## 2023-10-29 NOTE — Progress Notes (Signed)
Primary Care Physician: Leilani Able, MD Primary Cardiologist: Dr Eden Emms Primary Electrophysiologist: none Referring Physician: [facility] triage    Anne Roach is a 89 y.o. female with a history of HTN, DM, OSA, dementia, atrial fibrillation who presents for follow up in the [facility]. Patient is on Eliquis for a CHADS2VASC score of 5. Patient has been persistently in afib at least since 12/2018 as she was asymptomatic and rate controlled. Zio monitor 2022 showed 100% afib burden with good rate control.  Patient and daughter decline interpreter today. On follow up today, patient called [facility] 12/10 with low BP readings and dizziness. Her metoprolol and Lasix were held. Her blood pressure today is a little improved and patient has not had any dizziness. She remains in permanent afib.  Today, she denies symptoms of palpitations, chest pain, shortness of breath, orthopnea, PND, lower extremity edema, dizziness, presyncope, syncope, bleeding, or neurologic sequela. The patient is tolerating medications without difficulties and is otherwise without complaint today.    Atrial Fibrillation Risk Factors:  she does have symptoms or diagnosis of sleep apnea. she is compliant with CPAP therapy.   Atrial Fibrillation Management history:  Previous antiarrhythmic drugs: none Previous cardioversions: none Previous ablations: none Anticoagulation history: Eliquis   Past Medical History:  Diagnosis Date   A-fib (HCC)    Acute on chronic diastolic CHF (congestive heart failure) (HCC) 03/27/2022   Anemia    Arthritis    Atrial fibrillation (HCC)    Atrial fibrillation with RVR (HCC) 03/20/2017   Cancer (HCC)    colon cancer   Chronic diastolic CHF (congestive heart failure) (HCC) 10/31/2020   DM (diabetes mellitus) (HCC)    HTN (hypertension)    Occasional tremors    on Inderal for this   Sepsis secondary to UTI (HCC) 07/11/2020   Sleep apnea    not  used cpap in a long time per daughter   Tremor     Current Outpatient Medications  Medication Sig Dispense Refill   ACCU-CHEK AVIVA PLUS test strip      acetaminophen (TYLENOL) 500 MG tablet Take 1,000 mg by mouth every 6 (six) hours as needed for mild pain or headache.     allopurinol (ZYLOPRIM) 100 MG tablet TAKE 1 TABLET(100 MG) BY MOUTH DAILY 90 tablet 2   Cholecalciferol (VITAMIN D) 125 MCG (5000 UT) CAPS Take 5,000 Units by mouth daily with breakfast.     donepezil (ARICEPT) 5 MG tablet Take 5 mg by mouth at bedtime.     ELIQUIS 5 MG TABS tablet TAKE 1 TABLET(5 MG) BY MOUTH TWICE DAILY 60 tablet 5   empagliflozin (JARDIANCE) 10 MG TABS tablet Take 1 tablet (10 mg total) by mouth daily. 90 tablet 2   ferrous sulfate 325 (65 FE) MG EC tablet Take 325 mg by mouth every other day.     fluticasone (FLONASE) 50 MCG/ACT nasal spray Place 1 spray into both nostrils daily. 16 each 0   folic acid (FOLVITE) 800 MCG tablet Take 800 mcg by mouth daily.     gabapentin (NEURONTIN) 400 MG capsule Take 400 mg by mouth 3 (three) times daily.     Iron-FA-B Cmp-C-Biot-Probiotic (FUSION PLUS) CAPS Take 1 capsule by mouth every other day.     lidocaine (XYLOCAINE) 2 % solution Use as directed 10 mLs in the mouth or throat every 6 (six) hours as needed (stomach pain). 100 mL 0   loratadine (CLARITIN) 10 MG tablet Take 10 mg by  mouth daily as needed for allergies.     Multiple Vitamins-Minerals (ICAPS AREDS 2 PO) Take 1 capsule by mouth 2 (two) times daily.     Potassium Chloride ER 20 MEQ TBCR Take 20 mEq by mouth daily. 60 tablet    propranolol (INDERAL) 20 MG tablet Take 20 mg by mouth 2 (two) times daily.     furosemide (LASIX) 40 MG tablet TAKE 1 TABLET(40 MG) BY MOUTH DAILY (Patient not taking: Reported on 10/29/2023) 90 tablet 1   metoprolol succinate (TOPROL-XL) 50 MG 24 hr tablet TAKE 1 TABLET(50 MG) BY MOUTH DAILY WITH OR IMMEDIATELY FOLLOWING A MEAL (Patient not taking: Reported on 10/29/2023) 30  tablet 0   No current facility-administered medications for this encounter.    ROS- All systems are reviewed and negative except as per the HPI above.  Physical Exam: Vitals:   10/29/23 1104  BP: 106/64  Pulse: 98  Weight: 69.2 kg  Height: 5\' 5"  (1.651 m)     GEN: Well nourished, well developed in no acute distress NECK: No JVD; No carotid bruits CARDIAC: Irregularly irregular rate and rhythm, no murmurs, rubs, gallops RESPIRATORY:  Clear to auscultation without rales, wheezing or rhonchi  ABDOMEN: Soft, non-tender, non-distended EXTREMITIES:  No edema; No deformity    Wt Readings from Last 3 Encounters:  10/29/23 69.2 kg  10/11/23 77 kg  08/12/23 69.6 kg    EKG today demonstrates  Afib Vent. rate 98 BPM PR interval * ms QRS duration 84 ms QT/QTcB 364/464 ms   Echo 03/27/22 demonstrated   1. Left ventricular ejection fraction, by estimation, is 50 to 55%. The  left ventricle has low normal function. The left ventricle demonstrates  global hypokinesis. There is mild left ventricular hypertrophy. Left  ventricular diastolic function could not be evaluated. There is incoordinate septal motion.   2. Right ventricular systolic function is normal. The right ventricular  size is normal. There is mildly elevated pulmonary artery systolic  pressure. The estimated right ventricular systolic pressure is 40.9 mmHg.   3. Left atrial size was severely dilated.   4. Right atrial size was moderately dilated.   5. A small pericardial effusion is present. The pericardial effusion is  posterior to the left ventricle.   6. The mitral valve is abnormal. Mild mitral valve regurgitation.   7. The aortic valve is tricuspid. Aortic valve regurgitation is not  visualized. Aortic valve sclerosis is present, with no evidence of aortic  valve stenosis.   8. The inferior vena cava is normal in size with greater than 50%  respiratory variability, suggesting right atrial pressure of 3 mmHg.    Comparison(s): No significant change from prior study. 11/01/2020: LVEF  50-55%.   Epic records are reviewed at length today  CHA2DS2-VASc Score = 7  The patient's score is based upon: CHF History: 1 HTN History: 1 Diabetes History: 1 Stroke History: 0 Vascular Disease History: 1 Age Score: 2 Gender Score: 1      ASSESSMENT AND PLAN: Permanent Atrial Fibrillation (ICD10:  I48.11) The patient's CHA2DS2-VASc score is 7, indicating a 11.2% annual risk of stroke.   Patient remains in permanent afib. Continue Eliquis 5 mg BID (weight >60 kg, Cr <1.5) Continue propranolol 20 mg BID for rate control (also taking for tremors)  Secondary Hypercoagulable State (ICD10:  D68.69) The patient is at significant risk for stroke/thromboembolism based upon her CHA2DS2-VASc Score of 7.  Continue Apixaban (Eliquis).   OSA  Encouraged nightly CPAP  HTN Stable today,  dizziness improved Will continue to hold Lasix and metoprolol   Chronic diastolic CHF Fluid status appears stable GDMT per primary cardiology team   Follow up with Dr Eden Emms or APP in 2-3 weeks.    Jorja Loa PA-C Afib Clinic [facility] [address] [place], [place] [address] [phone] 10/29/2023 11:11 AM

## 2023-11-14 ENCOUNTER — Other Ambulatory Visit: Payer: Self-pay | Admitting: Cardiovascular Disease

## 2023-11-17 ENCOUNTER — Telehealth: Payer: Self-pay | Admitting: Cardiovascular Disease

## 2023-11-17 MED ORDER — METOPROLOL SUCCINATE ER 25 MG PO TB24: 25.0000 mg | ORAL_TABLET | Freq: Every day | ORAL | 3 refills | Status: DC

## 2023-11-17 NOTE — Telephone Encounter (Signed)
Pt's daughter needs to speak with a nurse. Please advise

## 2023-11-17 NOTE — Telephone Encounter (Signed)
 Pt called to afib clinic stating since stopping metoprolol  pt is having more palpitations - daughter states she has been giving her metoprolol  to stop the palpitations. She denies dizziness or drop in BP with taking metoprolol . BP 110/70. Discussed with Daril Kicks PA will restart metoprolol  at 25mg  daily. Pt to follow up as scheduled.

## 2023-11-20 ENCOUNTER — Telehealth: Payer: Self-pay | Admitting: Cardiovascular Disease

## 2023-11-20 NOTE — Telephone Encounter (Signed)
 Pt c/o medication issue:  1. Name of Medication:   metoprolol  succinate (TOPROL  XL) 25 MG 24 hr tablet   2. How are you currently taking this medication (dosage and times per day)?   Patient stopped taking medication  3. Are you having a reaction (difficulty breathing--STAT)?   4. What is your medication issue?   Patient stated this medication was stopped at the [facility] and she wants a call back to discuss this.

## 2023-11-20 NOTE — Telephone Encounter (Signed)
 Spoke with daughter per DPR and she states AFIB clinic discontinued patient's metoprolol . Patient started having palpitatios last week. Afib clinic ordered metoprolol  succinate 25 mg. Daughter sattes patient was taking 50 mg before. She does not feel  the 25 mg is enough because patient is still having episodes of palpitations. She can not give me any vitals. Patient is currently fine and is asymptomatic. She is just concerned being that its the weekend and she would like to know if she can give her 2 25 mg tablets of metoprolol  succinate

## 2023-11-21 ENCOUNTER — Encounter (HOSPITAL_COMMUNITY): Payer: Self-pay

## 2023-11-21 ENCOUNTER — Other Ambulatory Visit: Payer: Self-pay

## 2023-11-21 ENCOUNTER — Emergency Department (HOSPITAL_COMMUNITY)
Admission: EM | Admit: 2023-11-21 | Discharge: 2023-11-21 | Disposition: A | Discharge status: Home / Self Care | Payer: Medicare HMO | Attending: Emergency Medicine | Admitting: Emergency Medicine

## 2023-11-21 ENCOUNTER — Emergency Department (HOSPITAL_COMMUNITY): Payer: Medicare HMO

## 2023-11-21 ENCOUNTER — Other Ambulatory Visit (HOSPITAL_COMMUNITY): Payer: Self-pay | Admitting: Cardiovascular Disease

## 2023-11-21 DIAGNOSIS — J90 Pleural effusion, not elsewhere classified: Secondary | ICD-10-CM | POA: Diagnosis not present

## 2023-11-21 DIAGNOSIS — I4891 Unspecified atrial fibrillation: Secondary | ICD-10-CM | POA: Insufficient documentation

## 2023-11-21 DIAGNOSIS — Z7901 Long term (current) use of anticoagulants: Secondary | ICD-10-CM | POA: Diagnosis not present

## 2023-11-21 DIAGNOSIS — R0602 Shortness of breath: Secondary | ICD-10-CM | POA: Diagnosis present

## 2023-11-21 LAB — CBC
HCT: 41.4 % (ref 36.0–46.0)
Hemoglobin: 13 g/dL (ref 12.0–15.0)
MCH: 30.4 pg (ref 26.0–34.0)
MCHC: 31.4 g/dL (ref 30.0–36.0)
MCV: 96.7 fL (ref 80.0–100.0)
Platelets: 209 10*3/uL (ref 150–400)
RBC: 4.28 MIL/uL (ref 3.87–5.11)
RDW: 16.1 % — ABNORMAL HIGH (ref 11.5–15.5)
WBC: 6.5 10*3/uL (ref 4.0–10.5)
nRBC: 0 % (ref 0.0–0.2)

## 2023-11-21 LAB — BASIC METABOLIC PANEL
Anion gap: 10 (ref 5–15)
BUN: 8 mg/dL (ref 8–23)
CO2: 23 mmol/L (ref 22–32)
Calcium: 8.9 mg/dL (ref 8.9–10.3)
Chloride: 107 mmol/L (ref 98–111)
Creatinine, Ser: 0.92 mg/dL (ref 0.44–1.00)
GFR, Estimated: 60 mL/min — ABNORMAL LOW (ref >60)
Glucose, Bld: 94 mg/dL (ref 70–99)
Potassium: 4.3 mmol/L (ref 3.5–5.1)
Sodium: 140 mmol/L (ref 135–145)

## 2023-11-21 LAB — BRAIN NATRIURETIC PEPTIDE: B Natriuretic Peptide: 704.9 pg/mL — ABNORMAL HIGH (ref 0.0–100.0)

## 2023-11-21 LAB — TROPONIN I (HIGH SENSITIVITY)
Troponin I (High Sensitivity): 15 ng/L (ref <18)
Troponin I (High Sensitivity): 12 ng/L (ref <18)

## 2023-11-21 MED ORDER — FUROSEMIDE 20 MG PO TABS: 20.0000 mg | ORAL_TABLET | ORAL | 0 refills | Status: DC

## 2023-11-21 MED ORDER — METOPROLOL TARTRATE 25 MG PO TABS: 25.0000 mg | ORAL_TABLET | Freq: Two times a day (BID) | ORAL | 0 refills | Status: DC

## 2023-11-21 NOTE — ED Notes (Signed)
 Patient discharged by this RN. Patient verbalizes understanding of instructions without additional questions. Patient in wheelchair to lobby with daughter.

## 2023-11-21 NOTE — ED Provider Notes (Signed)
 [place] EMERGENCY DEPARTMENT AT [facility] Provider Note   CSN: [id number] Arrival date & time: 11/21/23  0846     History  Chief Complaint  Patient presents with   Palpitations    Anne Roach is a 89 y.o. female.  She has a history of atrial fibrillation and is on Eliquis .  She seems to be persistently in A-fib but was asymptomatic during her last cardiology visit in December.  She was having some dizziness lightheadedness so they held her metoprolol  and furosemide .  She still on propranolol  20 twice daily.  Since then she has had more episodes of rapid heart rate.  They talked to cardiology yesterday and the recommendation was to start back on 25 mg.  She still having intermittent breakthroughs of rapid heart rate and this sometimes causes her some pain in her chest.  She said she feels a little short of breath but is not really troubled with it.  She has a known history of colon cancer and had a resection in November, right hemicolectomy and partial omentectomy with lysis of adhesions.  It sounds like she is not a chemotherapy candidate per the last note of Dr. Lanny.  No fevers no significant leg swelling no abdominal pain  The history is provided by the patient and a relative.  Palpitations Palpitations quality:  Fast Onset quality:  Gradual Timing:  Intermittent Progression:  Unchanged Chronicity:  Recurrent Relieved by:  Nothing Ineffective treatments:  Beta blockers Associated symptoms: chest pain and shortness of breath   Associated symptoms: no cough, no diaphoresis, no nausea and no vomiting   Risk factors: hx of atrial fibrillation        Home Medications Prior to Admission medications   Medication Sig Start Date End Date Taking? Authorizing Provider  ACCU-CHEK AVIVA PLUS test strip  10/06/20   [provider]  acetaminophen  (TYLENOL ) 500 MG tablet Take 1,000 mg by mouth every 6 (six) hours as needed for mild pain or headache.    [provider]  allopurinol  (ZYLOPRIM ) 100 MG tablet TAKE 1 TABLET(100 MG) BY MOUTH DAILY 08/09/21   Janit Thresa HERO, DPM  Cholecalciferol  (VITAMIN D ) 125 MCG (5000 UT) CAPS Take 5,000 Units by mouth daily with breakfast.    [provider]  donepezil  (ARICEPT ) 5 MG tablet Take 5 mg by mouth at bedtime.    [provider]  ELIQUIS  5 MG TABS tablet TAKE 1 TABLET(5 MG) BY MOUTH TWICE DAILY 08/24/23   Delford Maude BROCKS, MD  empagliflozin  (JARDIANCE ) 10 MG TABS tablet Take 1 tablet (10 mg total) by mouth daily. 03/27/23   Delford Maude BROCKS, MD  ferrous sulfate  325 (65 FE) MG EC tablet Take 325 mg by mouth every other day.    [provider]  fluticasone  (FLONASE ) 50 MCG/ACT nasal spray Place 1 spray into both nostrils daily. 07/02/23   Doretha Folks, MD  folic acid  (FOLVITE ) 800 MCG tablet Take 800 mcg by mouth daily.    [provider]  gabapentin  (NEURONTIN ) 400 MG capsule Take 400 mg by mouth 3 (three) times daily. 03/25/22   [provider]  Iron-FA-B Cmp-C-Biot-Probiotic (FUSION PLUS) CAPS Take 1 capsule by mouth every other day. 08/26/23   [provider]  lidocaine  (XYLOCAINE ) 2 % solution Use as directed 10 mLs in the mouth or throat every 6 (six) hours as needed (stomach pain). 11/27/22   Trine Raynell Moder, MD  loratadine  (CLARITIN ) 10 MG tablet Take 10 mg by mouth  daily as needed for allergies. 08/28/23   [provider]  metoprolol  succinate (TOPROL  XL) 25 MG 24 hr tablet Take 1 tablet (25 mg total) by mouth daily. 11/17/23   Fenton, Clint R, PA  Multiple Vitamins-Minerals (ICAPS AREDS 2 PO) Take 1 capsule by mouth 2 (two) times daily.    [provider]  Potassium Chloride  ER 20 MEQ TBCR Take 20 mEq by mouth daily. 04/09/22   Clegg, Amy D, NP  propranolol  (INDERAL ) 20 MG tablet Take 20 mg by mouth 2 (two) times daily. 09/22/23   [provider]      Allergies    Patient has no known allergies.    Review of  Systems   Review of Systems  Constitutional:  Negative for diaphoresis.  Respiratory:  Positive for shortness of breath. Negative for cough.   Cardiovascular:  Positive for chest pain and palpitations.  Gastrointestinal:  Negative for nausea and vomiting.    Physical Exam Updated Vital Signs BP (!) 152/73 (BP Location: Right Arm)   Pulse 70   Temp 97.7 F (36.5 C)   Resp 20   Ht 5' 5 (1.651 m)   Wt 68.9 kg   SpO2 94%   BMI 25.29 kg/m  Physical Exam Vitals and nursing note reviewed.  Constitutional:      General: She is not in acute distress.    Appearance: Normal appearance. She is well-developed.  HENT:     Head: Normocephalic and atraumatic.  Eyes:     Conjunctiva/sclera: Conjunctivae normal.  Cardiovascular:     Rate and Rhythm: Normal rate. Rhythm irregular.     Heart sounds: No murmur heard. Pulmonary:     Effort: Pulmonary effort is normal. No respiratory distress.     Breath sounds: Normal breath sounds.  Abdominal:     Palpations: Abdomen is soft.     Tenderness: There is no abdominal tenderness. There is no guarding or rebound.  Musculoskeletal:     Cervical back: Neck supple.     Right lower leg: No edema.     Left lower leg: No edema.  Skin:    General: Skin is warm and dry.     Capillary Refill: Capillary refill takes less than 2 seconds.  Neurological:     General: No focal deficit present.     Mental Status: She is alert.     ED Results / Procedures / Treatments   Labs (all labs ordered are listed, but only abnormal results are displayed) Labs Reviewed  BASIC METABOLIC PANEL - Abnormal; Notable for the following components:      Result Value   GFR, Estimated 60 (*)    All other components within normal limits  CBC - Abnormal; Notable for the following components:   RDW 16.1 (*)    All other components within normal limits  BRAIN NATRIURETIC PEPTIDE - Abnormal; Notable for the following components:   B Natriuretic Peptide 704.9 (*)    All  other components within normal limits  TROPONIN I (HIGH SENSITIVITY)  TROPONIN I (HIGH SENSITIVITY)    EKG EKG Interpretation Date/Time:  Saturday November 21 2023 09:04:28 EST Ventricular Rate:  103 PR Interval:    QRS Duration:  78 QT Interval:  378 QTC Calculation: 495 R Axis:   -28  Text Interpretation: Atrial fibrillation with rapid ventricular response with premature ventricular or aberrantly conducted complexes Low voltage QRS Septal infarct , age undetermined Abnormal ECG When compared with ECG of 29-Oct-2023 11:07, No significant change since  last tracing Confirmed by Towana Sharper [phone]) on 11/21/2023 12:03:22 PM  Radiology DG Chest 1 View Result Date: 11/21/2023 CLINICAL DATA:  Mild shortness of breath.  Palpitations EXAM: CHEST  1 VIEW COMPARISON:  07/02/2023 FINDINGS: Haziness of the left more than right lower chest with pleural effusion appearance at the edges. Cardiopericardial enlargement, there has been pericardial effusion by prior CT imaging. No pneumothorax. IMPRESSION: 1. Left more than right pleural effusion obscuring the lower lungs. 2. Chronic cardiopericardial enlargement. Pericardial effusion has been seen on prior studies. Electronically Signed   By: Dorn Roulette M.D.   On: 11/21/2023 10:11    Procedures Procedures    Medications Ordered in ED Medications - No data to display  ED Course/ Medical Decision Making/ A&P Clinical Course as of 11/21/23 1702  Sat Nov 21, 2023  1327 Discussed with Dr. Alvan cardiology.  He asked that we put her back on 25 Toprol  twice daily and Lasix  20 q. OD.  She has an appointment in the next few days and they can see how she tolerated this change.  I reviewed this with the daughter and she is comfortable with plan. [MB]    Clinical Course User Index [MB] Towana Sharper BROCKS, MD                                 Medical Decision Making Amount and/or Complexity of Data Reviewed Labs: ordered.  Risk Prescription drug  management.   This patient complains of rapid heart rate and chest pain; this involves an extensive number of treatment Options and is a complaint that carries with it a high risk of complications and morbidity. The differential includes arrhythmia, ACS, GERD, musculoskeletal, pneumonia, pneumothorax  I ordered, reviewed and interpreted labs, which included CBC normal chemistries troponins flat BNP mildly elevated  I ordered imaging studies which included chest x-ray and I independently    visualized and interpreted imaging which showed pleural effusion left greater than right Additional history obtained from patient's daughter Previous records obtained and reviewed recent cardiology notes I consulted Dr. Alvan cardiology and discussed lab and imaging findings and discussed disposition.  Cardiac monitoring reviewed, A-fib with RVR initially down to A-fib with controlled rate Social determinants considered, no significant barriers Critical Interventions: None  After the interventions stated above, I reevaluated the patient and found patient resting comfortably satting well on room air in no distress Admission and further testing considered, no indications for admission.  Will adjust her beta-blocker and start her back on her diuretic per cardiology recommendations.  Daughter comfortable plan with outpatient follow-up with cardiology.  Return instructions discussed         Final Clinical Impression(s) / ED Diagnoses Final diagnoses:  Atrial fibrillation with RVR (HCC)  Pleural effusion    Rx / DC Orders ED Discharge Orders          Ordered    metoprolol  tartrate (LOPRESSOR ) 25 MG tablet  2 times daily        11/21/23 1336    furosemide  (LASIX ) 20 MG tablet  Every other day        11/21/23 1336              Towana Sharper BROCKS, MD 11/21/23 1704

## 2023-11-21 NOTE — Discharge Instructions (Signed)
 You are seen in the emergency department for elevated heart rate and intermittent chest pain.  Your atrial fibrillation was going fast.  Cardiology is recommending you change your metoprolol  to the short acting 25 mg twice a day.  He also wants you to go back on the furosemide  20 mg every other day.  Keep your appointment with cardiology coming up.  Return to the emergency department if any worsening or concerning symptoms

## 2023-11-21 NOTE — ED Triage Notes (Signed)
 Pt BIB [facility] from home c/o palpitations and CP that started this morning. Pt does have a hx of a-fib and recently seen her cardiologist who stopped her Metoprolol.

## 2023-11-21 NOTE — ED Provider Triage Note (Signed)
 Emergency Medicine Provider Triage Evaluation Note  Anne Roach , a 89 y.o. female  was evaluated in triage.  Pt complains of intermittent palpitations that started at 1200 this morning . Metoprolol  this morning at 1200 did not help with pain  Does not currently have palpitations nor CP  Review of Systems  Positive: palpitations Negative: Fever, sob  Physical Exam  BP 139/81 (BP Location: Right Arm)   Pulse (!) 107   Temp 97.7 F (36.5 C)   Resp 16   SpO2 96%  Gen:   Awake, no distress   Resp:  Normal effort  MSK:   Moves extremities without difficulty  Other:    Medical Decision Making  Medically screening exam initiated at 9:25 AM.  Appropriate orders placed.  Anne Roach was informed that the remainder of the evaluation will be completed by another provider, this initial triage assessment does not replace that evaluation, and the importance of remaining in the ED until their evaluation is complete.  Labs and xr ordered   Anne Roach, [place] 11/21/23 [phone]

## 2023-11-23 ENCOUNTER — Other Ambulatory Visit: Payer: Medicare HMO

## 2023-11-23 ENCOUNTER — Ambulatory Visit: Payer: Medicare HMO | Admitting: Hematology

## 2023-11-23 NOTE — Progress Notes (Signed)
 Cardiology Office Note    Patient Name: Anne Roach Date of Encounter: 11/25/2023  Primary Care Provider:  Ilah Crigler, MD Primary Cardiologist:  Maude Emmer, MD Primary Electrophysiologist: None   Past Medical History    Past Medical History:  Diagnosis Date   A-fib [facility])    Acute on chronic diastolic CHF (congestive heart failure) (HCC) 03/27/2022   Anemia    Arthritis    Atrial fibrillation (HCC)    Atrial fibrillation with RVR (HCC) 03/20/2017   Cancer (HCC)    colon cancer   Chronic diastolic CHF (congestive heart failure) (HCC) 10/31/2020   DM (diabetes mellitus) (HCC)    HTN (hypertension)    Occasional tremors    on Inderal  for this   Sepsis secondary to UTI (HCC) 07/11/2020   Sleep apnea    not used cpap in a long time per daughter   Tremor     History of Present Illness  Anne Roach is a 89 y.o. 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  Anne Roach followed by Dr. Emmer since 2012 when of coronary artery disease.  She was seen initially in 2010 plaint of chest pain and underwent a nuclear stress test that showed no evidence of ischemia.  She was seen in the hospital on 05/2011 for complaint of chest pain and underwent myocardial perfusion study.  She was evaluated in 2020 and diagnosed with atrial fibrillation in the setting of dehydration.  She was started on Cardizem  and Eliquis  with good rate control. 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  She was found to have UTI and GI infection and treated with azithromycin  and Keflex .  She was diagnosed with dementia and started on Namenda  and readmitted in 2021 in the setting of urosepsis E. Coli.   She was admitted to the ED by EMS in 03/2022 in the setting of shortness of breath.  She was found to  be hypoxic O2 in the room air BNP elevated at 449 and pleural effusions present on x-ray.  She was diuresed with Lasix  IV 40 mg and 2D echo showed low normal EF of 50-55% with global hypokinesis and mild LVH with severely dilated LA/moderately dilated RA and small pericardial effusion present.  Thoracentesis was deferred on 04/02/2022.  She was seen in the HF [facility] clinic on 04/09/2022 with worsening shortness of breath and orthopnea and was prescribed FUROSCIX  80 mg x 1 w/ instruction to transition back to PO Lasix .  She was seen by Dr. Emmer on 10/29/2022 with some improvement and grossly euvolemic BNP was improved patient was continued on Jardiance  and Toprol  XL with discussion made regarding Entresto but deferred at that time.  She was diagnosed with cecal colon cancer underwent laparoscopic right hemicolectomy in 09/2023 and was admitted postoperatively well.  She was seen in the AF clinic on 10/29/2023 recontacting nurse line with complaint of lightheadedness and dizziness with irregular heartbeat per home health nurse.  She was also found to have low BP and metoprolol  and Lasix  were both held.  During visit patient had some improvement to BP has had subsided.  He was continued on propranolol  essential tremor and Lasix  and metoprolol  were held and patient was euvolemic on exam.    Anne Roach presents today for follow-up with  her daughter. She presents with persistent palpitations despite medication adjustments. The patient was previously on metoprolol  and Lasix , but these were stopped due to episodes of lightheadedness, dizziness, and low blood pressure. However, the palpitations persisted, leading to an ER visit. The metoprolol  was then restarted at a lower dose, but the patient reports that the palpitations still occur, particularly at night. The patient also has tremors, which have not improved with propranolol . The patient has fluid retention, which has increased since the Lasix  was decreased. The patient's  blood pressure has improved, and the heart rate is controlled, but the patient is still experiencing palpitations   Patient denies chest pain,  dyspnea, PND, orthopnea, nausea, vomiting, dizziness, syncope, edema, weight gain, or early satiety.   Review of Systems  Please see the history of present illness.    All other systems reviewed and are otherwise negative except as noted above.  Physical Exam    Wt Readings from Last 3 Encounters:  11/25/23 152 lb 8 oz (69.2 kg)  11/21/23 152 lb (68.9 kg)  10/29/23 152 lb 9.6 oz (69.2 kg)   VS: Vitals:   11/25/23 0849  BP: 132/82  Pulse: 78  SpO2: 94%  ,Body mass index is 25.38 kg/m. GEN: Well nourished, well developed in no acute distress Neck: No JVD; No carotid bruits Pulmonary: Clear to auscultation on the right lobe diminished in the base on left lower lobe  Cardiovascular: Irregularly irregular rhythm. Normal S1. Normal S2.   Murmurs: There is no murmur.  ABDOMEN: Soft, non-tender, non-distended EXTREMITIES:  No edema; No deformity   EKG/LABS/ Recent Cardiac Studies   ECG personally reviewed by me today -none completed today  Risk Assessment/Calculations:    CHA2DS2-VASc Score = 6   This indicates a 9.7% annual risk of stroke. The patient's score is based upon: CHF History: 1 HTN History: 1 Diabetes History: 1 Stroke History: 0 Vascular Disease History: 0 Age Score: 2 Gender Score: 1         Lab Results  Component Value Date   WBC 6.5 11/21/2023   HGB 13.0 11/21/2023   HCT 41.4 11/21/2023   MCV 96.7 11/21/2023   PLT 209 11/21/2023   Lab Results  Component Value Date   CREATININE 0.92 11/21/2023   BUN 8 11/21/2023   NA 140 11/21/2023   K 4.3 11/21/2023   CL 107 11/21/2023   CO2 23 11/21/2023   No results found for: CHOL, HDL, LDLCALC, LDLDIRECT, TRIG, CHOLHDL  Lab Results  Component Value Date   HGBA1C 5.3 09/30/2023   Assessment & Plan    1.  Permanent AF: Persistent palpitations  despite metoprolol  25mg  daily. Palpitations are more prominent at night. Blood pressure is controlled. Discussed increasing metoprolol  to better control palpitations. -Stop Propranolol  20mg . -Start Metoprolol  (Toprol  XL) 50mg  daily. -Change Metoprolol  tartrate to 25mg  PRN for breakthrough palpitations. -Follow up with AFib clinic in a few weeks.  2.  Chronic diastolic CHF: -Evidence of fluid retention in lower extremities. Lasix  20mg  every other day may not be sufficient. -Increase Lasix  to 20mg  daily. -Continue Potassium supplement daily. -Check labs in 2 weeks to ensure kidney function and potassium levels are stable.  3.  Essential hypertension: -Patient's blood pressure today was stable at 132/82 Continue Toprol -XL 50 mg daily  4.  DM type II: -Patient's last hemoglobin A1c was 9.2 - -continue plan per PCP  5.  Palpitations: -Discussed increasing metoprolol  to better control palpitations. -Continue Toprol  XL 50 mg daily with as needed Metoprolol   tartrate 25 mg  Disposition: Follow-up with Maude Emmer, MD or APP in 4 months    Signed, Wyn Raddle, Jackee Shove, NP 11/25/2023, 12:04 PM [facility]

## 2023-11-23 NOTE — Telephone Encounter (Signed)
 Per Dr. Delford, it looks like she is also on inderal  ?? Pulse in clinic with Daril was 15 would clarify if she is on both interal for tremors and toprol  appears ok to just change her to toprol  50 mg and have her take 2 25 mg tabs till they run out then take the new 50 mg    Called patient's daughter, DPR, about message and Dr. Claiborne advisement. Patient went to ED on Saturday. She would like to see Dr. Delford on Wednesday and just discuss it with him at that time.

## 2023-11-24 ENCOUNTER — Inpatient Hospital Stay: Payer: Medicare HMO | Admitting: Hematology

## 2023-11-24 ENCOUNTER — Inpatient Hospital Stay: Payer: Medicare HMO | Attending: Hematology

## 2023-11-25 ENCOUNTER — Ambulatory Visit: Payer: Medicare HMO | Attending: Nurse Practitioner | Admitting: Nurse Practitioner

## 2023-11-25 ENCOUNTER — Other Ambulatory Visit: Payer: Self-pay | Admitting: *Deleted

## 2023-11-25 ENCOUNTER — Encounter: Payer: Self-pay | Admitting: Nurse Practitioner

## 2023-11-25 VITALS — BP 132/82 | HR 78 | Wt 152.5 lb

## 2023-11-25 DIAGNOSIS — I4821 Permanent atrial fibrillation: Secondary | ICD-10-CM

## 2023-11-25 DIAGNOSIS — I5032 Chronic diastolic (congestive) heart failure: Secondary | ICD-10-CM | POA: Diagnosis not present

## 2023-11-25 DIAGNOSIS — I10 Essential (primary) hypertension: Secondary | ICD-10-CM | POA: Diagnosis not present

## 2023-11-25 DIAGNOSIS — E1159 Type 2 diabetes mellitus with other circulatory complications: Secondary | ICD-10-CM | POA: Diagnosis not present

## 2023-11-25 DIAGNOSIS — R002 Palpitations: Secondary | ICD-10-CM

## 2023-11-25 MED ORDER — METOPROLOL SUCCINATE ER 50 MG PO TB24: 50.0000 mg | ORAL_TABLET | Freq: Every day | ORAL | 3 refills | Status: DC

## 2023-11-25 MED ORDER — FUROSEMIDE 20 MG PO TABS: 20.0000 mg | ORAL_TABLET | Freq: Every day | ORAL | 3 refills | Status: DC

## 2023-11-25 MED ORDER — METOPROLOL TARTRATE 25 MG PO TABS: 25.0000 mg | ORAL_TABLET | Freq: Every day | ORAL | Status: DC | PRN

## 2023-11-25 NOTE — Patient Instructions (Signed)
 Medication Instructions:  Your physician has recommended you make the following change in your medication:   CHANGE the Lopressor  to only as needed  STARTG Toprol  Xl 50 mg taking 1 daily  CHANGE the Lasix  to 1 every day  STOP Propranolol    *If you need a refill on your cardiac medications before your next appointment, please call your pharmacy*   Lab Work: 2 WEEKS, GO TO A [facility] FOR:  BMET  If you have labs (blood work) drawn today and your tests are completely normal, you will receive your results only by: MyChart Message (if you have MyChart) OR A paper copy in the mail If you have any lab test that is abnormal or we need to change your treatment, we will call you to review the results.   Testing/Procedures: None ordered   Follow-Up: At [facility], you and your health needs are our priority.  As part of our continuing mission to provide you with exceptional heart care, we have created designated Provider Care Teams.  These Care Teams include your primary Cardiologist (physician) and Advanced Practice Providers (APPs -  Physician Assistants and Nurse Practitioners) who all work together to provide you with the care you need, when you need it.  We recommend signing up for the patient portal called MyChart.  Sign up information is provided on this After Visit Summary.  MyChart is used to connect with patients for Virtual Visits (Telemedicine).  Patients are able to view lab/test results, encounter notes, upcoming appointments, etc.  Non-urgent messages can be sent to your provider as well.   To learn more about what you can do with MyChart, go to [email or web address].    Your next appointment:   4 month(s)  Provider:   Maude Emmer, MD  or Jackee Alberts, NP         Other Instructions

## 2023-11-29 ENCOUNTER — Emergency Department (HOSPITAL_COMMUNITY)
Admission: EM | Admit: 2023-11-29 | Discharge: 2023-11-29 | Disposition: A | Discharge status: Home / Self Care | Payer: Medicare HMO | Attending: Emergency Medicine | Admitting: Emergency Medicine

## 2023-11-29 ENCOUNTER — Other Ambulatory Visit: Payer: Self-pay

## 2023-11-29 ENCOUNTER — Emergency Department (HOSPITAL_COMMUNITY): Payer: Medicare HMO

## 2023-11-29 ENCOUNTER — Encounter (HOSPITAL_COMMUNITY): Payer: Self-pay

## 2023-11-29 DIAGNOSIS — Z7901 Long term (current) use of anticoagulants: Secondary | ICD-10-CM | POA: Diagnosis not present

## 2023-11-29 DIAGNOSIS — R079 Chest pain, unspecified: Secondary | ICD-10-CM | POA: Diagnosis present

## 2023-11-29 LAB — BASIC METABOLIC PANEL
Anion gap: 13 (ref 5–15)
BUN: 12 mg/dL (ref 8–23)
CO2: 25 mmol/L (ref 22–32)
Calcium: 8.8 mg/dL — ABNORMAL LOW (ref 8.9–10.3)
Chloride: 103 mmol/L (ref 98–111)
Creatinine, Ser: 1.01 mg/dL — ABNORMAL HIGH (ref 0.44–1.00)
GFR, Estimated: 54 mL/min — ABNORMAL LOW (ref >60)
Glucose, Bld: 96 mg/dL (ref 70–99)
Potassium: 4 mmol/L (ref 3.5–5.1)
Sodium: 141 mmol/L (ref 135–145)

## 2023-11-29 LAB — CBC
HCT: 32.7 % — ABNORMAL LOW (ref 36.0–46.0)
Hemoglobin: 10.2 g/dL — ABNORMAL LOW (ref 12.0–15.0)
MCH: 30.2 pg (ref 26.0–34.0)
MCHC: 31.2 g/dL (ref 30.0–36.0)
MCV: 96.7 fL (ref 80.0–100.0)
Platelets: 231 10*3/uL (ref 150–400)
RBC: 3.38 MIL/uL — ABNORMAL LOW (ref 3.87–5.11)
RDW: 15.8 % — ABNORMAL HIGH (ref 11.5–15.5)
WBC: 5.8 10*3/uL (ref 4.0–10.5)
nRBC: 0 % (ref 0.0–0.2)

## 2023-11-29 LAB — TROPONIN I (HIGH SENSITIVITY)
Troponin I (High Sensitivity): 11 ng/L (ref <18)
Troponin I (High Sensitivity): 14 ng/L (ref <18)

## 2023-11-29 LAB — CBG MONITORING, ED: Glucose-Capillary: 96 mg/dL (ref 70–99)

## 2023-11-29 MED ORDER — ASPIRIN 81 MG PO CHEW: 324.0000 mg | CHEWABLE_TABLET | Freq: Once | ORAL | Status: DC

## 2023-11-29 MED ORDER — DICLOFENAC SODIUM 1 % EX GEL: 4.0000 g | Freq: Four times a day (QID) | CUTANEOUS | 0 refills | Status: AC

## 2023-11-29 MED ORDER — ACETAMINOPHEN 500 MG PO TABS
1000.0000 mg | ORAL_TABLET | Freq: Once | ORAL | Status: AC
  Administered 2023-11-29: 1000 mg via ORAL
  Filled 2023-11-29: qty 2

## 2023-11-29 MED ORDER — KETOROLAC TROMETHAMINE 15 MG/ML IJ SOLN
15.0000 mg | Freq: Once | INTRAMUSCULAR | Status: AC
Start: 2023-11-29 — End: 2023-11-29
  Administered 2023-11-29: 15 mg via INTRAVENOUS
  Filled 2023-11-29: qty 1

## 2023-11-29 NOTE — ED Notes (Signed)
 Pt family verbalized understanding of discharge instructions. Pt ambulated to wheelchair then was wheeled from ed

## 2023-11-29 NOTE — ED Triage Notes (Signed)
 Pt coming in from home where she had sudden onset of left sided chest pain at 10 this am. Pt described as sharp and pins and needles was 9/10 wile present but has been 0/10 Pt reports shob. Ems gave 324 of Asprin. 20 lac.  Ems vitals  141/76 Hr 90 Rr 16  96% ra

## 2023-11-29 NOTE — Discharge Instructions (Signed)
 Your test here are reassuring.  It is unlikely that this is a heart attack.  Please use the gel as prescribed.  Please follow with your family doctor in the office.  Use the gel as prescribed. Also take tylenol 1000mg (2 extra strength) four times a day.

## 2023-11-29 NOTE — ED Provider Notes (Signed)
 [place] EMERGENCY DEPARTMENT AT [facility] Provider Note   CSN: [id number] Arrival date & time: 11/29/23  1123     History  No chief complaint on file.   Anne Roach is a 89 y.o. female.  89 yo F with a cc of chest pain.  L sided sharp radiates across the chest.  Nothing seems to make it better or worse.  Going on for the past couple of hours.  No trauma, no abdominal pain.        Home Medications Prior to Admission medications   Medication Sig Start Date End Date Taking? Authorizing Provider  diclofenac  Sodium (VOLTAREN ) 1 % GEL Apply 4 g topically 4 (four) times daily. 11/29/23  Yes Emil Share, DO  ACCU-CHEK AVIVA PLUS test strip  10/06/20   [provider]  acetaminophen  (TYLENOL ) 500 MG tablet Take 1,000 mg by mouth every 6 (six) hours as needed for mild pain or headache.    [provider]  allopurinol  (ZYLOPRIM ) 100 MG tablet TAKE 1 TABLET(100 MG) BY MOUTH DAILY 08/09/21   Janit Thresa HERO, DPM  Cholecalciferol  (VITAMIN D ) 125 MCG (5000 UT) CAPS Take 5,000 Units by mouth daily with breakfast.    [provider]  ELIQUIS  5 MG TABS tablet TAKE 1 TABLET(5 MG) BY MOUTH TWICE DAILY 08/24/23   Nishan, Peter C, MD  empagliflozin  (JARDIANCE ) 10 MG TABS tablet Take 1 tablet (10 mg total) by mouth daily. 03/27/23   Nishan, Peter C, MD  folic acid  (FOLVITE ) 800 MCG tablet Take 800 mcg by mouth daily.    [provider]  furosemide  (LASIX ) 20 MG tablet Take 1 tablet (20 mg total) by mouth daily. 11/25/23   Wyn Jackee VEAR Mickey., NP  gabapentin  (NEURONTIN ) 400 MG capsule Take 400 mg by mouth 3 (three) times daily. 03/25/22   [provider]  Iron-FA-B Cmp-C-Biot-Probiotic (FUSION PLUS) CAPS Take 1 capsule by mouth every other day. 08/26/23   [provider]  loratadine  (CLARITIN ) 10 MG tablet Take 10 mg by mouth daily as needed for allergies. 08/28/23   [provider]  metoprolol  succinate (TOPROL  XL) 50 MG 24 hr  tablet Take 1 tablet (50 mg total) by mouth daily. Take with or immediately following a meal. 11/25/23   Wyn Jackee VEAR Mickey., NP  metoprolol  tartrate (LOPRESSOR ) 25 MG tablet Take 1 tablet (25 mg total) by mouth daily as needed (palpitations). 11/25/23   Wyn Jackee VEAR Mickey., NP  Multiple Vitamins-Minerals (ICAPS AREDS 2 PO) Take 1 capsule by mouth 2 (two) times daily.    [provider]  Potassium Chloride  ER 20 MEQ TBCR Take 20 mEq by mouth daily. 04/09/22   Lenetta No D, NP      Allergies    Patient has no known allergies.    Review of Systems   Review of Systems  Physical Exam Updated Vital Signs BP 134/85   Pulse 62   Temp 98.7 F (37.1 C)   Resp 19   Ht 5' 5 (1.651 m)   Wt 69.2 kg   SpO2 98%   BMI 25.38 kg/m  Physical Exam Vitals and nursing note reviewed.  Constitutional:      General: She is not in acute distress.    Appearance: She is well-developed. She is not diaphoretic.  HENT:     Head: Normocephalic and atraumatic.  Eyes:     Pupils: Pupils are equal, round, and reactive to light.  Cardiovascular:     Rate  and Rhythm: Normal rate and regular rhythm.     Heart sounds: No murmur heard.    No friction rub. No gallop.  Pulmonary:     Effort: Pulmonary effort is normal.     Breath sounds: No wheezing or rales.  Abdominal:     General: There is no distension.     Palpations: Abdomen is soft.     Tenderness: There is no abdominal tenderness.  Musculoskeletal:        General: Tenderness present.     Cervical back: Normal range of motion and neck supple.     Comments: Pain to the left chest wall reproduces her discomfort.    Skin:    General: Skin is warm and dry.  Neurological:     Mental Status: She is alert and oriented to person, place, and time.  Psychiatric:        Behavior: Behavior normal.     ED Results / Procedures / Treatments   Labs (all labs ordered are listed, but only abnormal results are displayed) Labs Reviewed  CBC - Abnormal;  Notable for the following components:      Result Value   RBC 3.38 (*)    Hemoglobin 10.2 (*)    HCT 32.7 (*)    RDW 15.8 (*)    All other components within normal limits  BASIC METABOLIC PANEL - Abnormal; Notable for the following components:   Creatinine, Ser 1.01 (*)    Calcium 8.8 (*)    GFR, Estimated 54 (*)    All other components within normal limits  CBG MONITORING, ED  TROPONIN I (HIGH SENSITIVITY)  TROPONIN I (HIGH SENSITIVITY)    EKG EKG Interpretation Date/Time:  Sunday November 29 2023 11:38:47 EST Ventricular Rate:  74 PR Interval:    QRS Duration:  102 QT Interval:  402 QTC Calculation: 446 R Axis:   -25  Text Interpretation: Atrial fibrillation Borderline left axis deviation Low voltage, precordial leads Probable anteroseptal infarct, old Borderline T abnormalities, inferior leads No significant change since last tracing Confirmed by Emil Share [phone]) on 11/29/2023 12:36:41 PM  Radiology DG Chest 2 View Result Date: 11/29/2023 CLINICAL DATA:  Chest pain EXAM: CHEST - 2 VIEW COMPARISON:  11/21/2023 FINDINGS: The cardio pericardial silhouette is enlarged. Moderate to large left pleural effusion again noted. Interval decrease in right base collapse/consolidation and effusion. Interstitial markings are diffusely coarsened with chronic features. No acute bony abnormality. Telemetry leads overlie the chest. IMPRESSION: Moderate to large left pleural effusion, similar to prior. Electronically Signed   By: Camellia Candle M.D.   On: 11/29/2023 12:26    Procedures Procedures    Medications Ordered in ED Medications  aspirin  chewable tablet 324 mg (324 mg Oral Not Given 11/29/23 1227)  ketorolac  (TORADOL ) 15 MG/ML injection 15 mg (has no administration in time range)  acetaminophen  (TYLENOL ) tablet 1,000 mg (1,000 mg Oral Given 11/29/23 1357)    ED Course/ Medical Decision Making/ A&P                                 Medical Decision Making Amount and/or Complexity  of Data Reviewed Labs: ordered. Radiology: ordered.  Risk OTC drugs. Prescription drug management.   89 yo F with a cc of chest pain.  Going on for a couple hours.  Atypical in nature reproduced on exam.  Will obtain delta, labs, cxr.   Chest x-ray independently interpreted by me without  focal infiltrate or pneumothorax.  2 troponins are negative.  No acute anemia, no significant electrolyte abnormalities.  Will discharge home.  PCP follow-up.  2:30 PM:  I have discussed the diagnosis/risks/treatment options with the patient.  Evaluation and diagnostic testing in the emergency department does not suggest an emergent condition requiring admission or immediate intervention beyond what has been performed at this time.  They will follow up with PCP. We also discussed returning to the ED immediately if new or worsening sx occur. We discussed the sx which are most concerning (e.g., sudden worsening pain, fever, inability to tolerate by mouth) that necessitate immediate return. Medications administered to the patient during their visit and any new prescriptions provided to the patient are listed below.  Medications given during this visit Medications  aspirin  chewable tablet 324 mg (324 mg Oral Not Given 11/29/23 1227)  ketorolac  (TORADOL ) 15 MG/ML injection 15 mg (has no administration in time range)  acetaminophen  (TYLENOL ) tablet 1,000 mg (1,000 mg Oral Given 11/29/23 1357)     The patient appears reasonably screen and/or stabilized for discharge and I doubt any other medical condition or other [facility] requiring further screening, evaluation, or treatment in the ED at this time prior to discharge.          Final Clinical Impression(s) / ED Diagnoses Final diagnoses:  Nonspecific chest pain    Rx / DC Orders ED Discharge Orders          Ordered    diclofenac  Sodium (VOLTAREN ) 1 % GEL  4 times daily        11/29/23 1429              Emil Share, DO 11/29/23 1430

## 2023-12-04 ENCOUNTER — Emergency Department (HOSPITAL_COMMUNITY): Payer: Medicare HMO

## 2023-12-04 ENCOUNTER — Observation Stay (HOSPITAL_COMMUNITY)
Admission: EM | Admit: 2023-12-04 | Discharge: 2023-12-05 | Disposition: A | Discharge status: Home / Self Care | Payer: Medicare HMO | Attending: Emergency Medicine | Admitting: Emergency Medicine

## 2023-12-04 ENCOUNTER — Other Ambulatory Visit: Payer: Self-pay

## 2023-12-04 DIAGNOSIS — I482 Chronic atrial fibrillation, unspecified: Secondary | ICD-10-CM | POA: Diagnosis not present

## 2023-12-04 DIAGNOSIS — E785 Hyperlipidemia, unspecified: Secondary | ICD-10-CM | POA: Insufficient documentation

## 2023-12-04 DIAGNOSIS — I10 Essential (primary) hypertension: Secondary | ICD-10-CM | POA: Diagnosis present

## 2023-12-04 DIAGNOSIS — Z7901 Long term (current) use of anticoagulants: Secondary | ICD-10-CM | POA: Diagnosis not present

## 2023-12-04 DIAGNOSIS — I130 Hypertensive heart and chronic kidney disease with heart failure and stage 1 through stage 4 chronic kidney disease, or unspecified chronic kidney disease: Secondary | ICD-10-CM | POA: Insufficient documentation

## 2023-12-04 DIAGNOSIS — Z9049 Acquired absence of other specified parts of digestive tract: Secondary | ICD-10-CM | POA: Diagnosis not present

## 2023-12-04 DIAGNOSIS — R079 Chest pain, unspecified: Principal | ICD-10-CM | POA: Insufficient documentation

## 2023-12-04 DIAGNOSIS — Z79899 Other long term (current) drug therapy: Secondary | ICD-10-CM | POA: Insufficient documentation

## 2023-12-04 DIAGNOSIS — G4733 Obstructive sleep apnea (adult) (pediatric): Secondary | ICD-10-CM | POA: Diagnosis present

## 2023-12-04 DIAGNOSIS — E119 Type 2 diabetes mellitus without complications: Secondary | ICD-10-CM

## 2023-12-04 DIAGNOSIS — Z85038 Personal history of other malignant neoplasm of large intestine: Secondary | ICD-10-CM | POA: Insufficient documentation

## 2023-12-04 DIAGNOSIS — I5033 Acute on chronic diastolic (congestive) heart failure: Secondary | ICD-10-CM | POA: Diagnosis not present

## 2023-12-04 DIAGNOSIS — N1832 Chronic kidney disease, stage 3b: Secondary | ICD-10-CM | POA: Diagnosis present

## 2023-12-04 DIAGNOSIS — Z7984 Long term (current) use of oral hypoglycemic drugs: Secondary | ICD-10-CM | POA: Diagnosis not present

## 2023-12-04 DIAGNOSIS — N183 Chronic kidney disease, stage 3 unspecified: Secondary | ICD-10-CM | POA: Diagnosis present

## 2023-12-04 DIAGNOSIS — E1122 Type 2 diabetes mellitus with diabetic chronic kidney disease: Secondary | ICD-10-CM | POA: Insufficient documentation

## 2023-12-04 DIAGNOSIS — N1831 Chronic kidney disease, stage 3a: Secondary | ICD-10-CM | POA: Diagnosis not present

## 2023-12-04 DIAGNOSIS — E1159 Type 2 diabetes mellitus with other circulatory complications: Secondary | ICD-10-CM

## 2023-12-04 LAB — CBC
HCT: 42.5 % (ref 36.0–46.0)
Hemoglobin: 13 g/dL (ref 12.0–15.0)
MCH: 29.9 pg (ref 26.0–34.0)
MCHC: 30.6 g/dL (ref 30.0–36.0)
MCV: 97.7 fL (ref 80.0–100.0)
Platelets: 213 10*3/uL (ref 150–400)
RBC: 4.35 MIL/uL (ref 3.87–5.11)
RDW: 15.6 % — ABNORMAL HIGH (ref 11.5–15.5)
WBC: 6.7 10*3/uL (ref 4.0–10.5)
nRBC: 0 % (ref 0.0–0.2)

## 2023-12-04 LAB — BASIC METABOLIC PANEL
Anion gap: 10 (ref 5–15)
BUN: 17 mg/dL (ref 8–23)
CO2: 28 mmol/L (ref 22–32)
Calcium: 9.3 mg/dL (ref 8.9–10.3)
Chloride: 101 mmol/L (ref 98–111)
Creatinine, Ser: 1.03 mg/dL — ABNORMAL HIGH (ref 0.44–1.00)
GFR, Estimated: 52 mL/min — ABNORMAL LOW (ref >60)
Glucose, Bld: 139 mg/dL — ABNORMAL HIGH (ref 70–99)
Potassium: 3.9 mmol/L (ref 3.5–5.1)
Sodium: 139 mmol/L (ref 135–145)

## 2023-12-04 LAB — TROPONIN I (HIGH SENSITIVITY)
Troponin I (High Sensitivity): 11 ng/L (ref <18)
Troponin I (High Sensitivity): 11 ng/L (ref <18)

## 2023-12-04 NOTE — ED Triage Notes (Signed)
Pt here from home with c/o chest pain , controlled afib pt was seen for same thing 5 days ago and a negative work up

## 2023-12-04 NOTE — ED Provider Notes (Signed)
[place] EMERGENCY DEPARTMENT AT [facility] Provider Note   CSN: [id number] Arrival date & time: 12/04/23  1319     History  Chief Complaint  Patient presents with   Chest Pain    [place] YUMNA PIZZITOLA is a 89 y.o. female.  89 year old female presents with recurrent left-sided chest pain.  Occurred at rest and was associated with dyspnea as well as diaphoresis.  Lasted for approximately 30 minutes and resolved on its own.  Seen 5 days ago for similar symptoms.  Does have a history of A-fib and takes Eliquis as well as a beta-blocker.  States that she has had some increased palpitations and is unsure of how fast her heart rate was going.  Denies any fever, cough congestion.  Pain has not been pleuritic.  No leg pain or swelling.  She is currently pain-free at this time.       Home Medications Prior to Admission medications   Medication Sig Start Date End Date Taking? Authorizing Provider  ACCU-CHEK AVIVA PLUS test strip  10/06/20   [provider]  acetaminophen (TYLENOL) 500 MG tablet Take 1,000 mg by mouth every 6 (six) hours as needed for mild pain or headache.    [provider]  allopurinol (ZYLOPRIM) 100 MG tablet TAKE 1 TABLET(100 MG) BY MOUTH DAILY 08/09/21   Felecia Shelling, DPM  Cholecalciferol (VITAMIN D) 125 MCG (5000 UT) CAPS Take 5,000 Units by mouth daily with breakfast.    [provider]  diclofenac Sodium (VOLTAREN) 1 % GEL Apply 4 g topically 4 (four) times daily. 11/29/23   Melene Plan, DO  ELIQUIS 5 MG TABS tablet TAKE 1 TABLET(5 MG) BY MOUTH TWICE DAILY 08/24/23   Wendall Stade, MD  empagliflozin (JARDIANCE) 10 MG TABS tablet Take 1 tablet (10 mg total) by mouth daily. 03/27/23   Wendall Stade, MD  folic acid (FOLVITE) 800 MCG tablet Take 800 mcg by mouth daily.    [provider]  furosemide (LASIX) 20 MG tablet Take 1 tablet (20 mg total) by mouth daily. 11/25/23   Gaston Islam., NP  gabapentin (NEURONTIN) 400 MG  capsule Take 400 mg by mouth 3 (three) times daily. 03/25/22   [provider]  Iron-FA-B Cmp-C-Biot-Probiotic (FUSION PLUS) CAPS Take 1 capsule by mouth every other day. 08/26/23   [provider]  loratadine (CLARITIN) 10 MG tablet Take 10 mg by mouth daily as needed for allergies. 08/28/23   [provider]  metoprolol succinate (TOPROL XL) 50 MG 24 hr tablet Take 1 tablet (50 mg total) by mouth daily. Take with or immediately following a meal. 11/25/23   Gaston Islam., NP  metoprolol tartrate (LOPRESSOR) 25 MG tablet Take 1 tablet (25 mg total) by mouth daily as needed (palpitations). 11/25/23   Gaston Islam., NP  Multiple Vitamins-Minerals (ICAPS AREDS 2 PO) Take 1 capsule by mouth 2 (two) times daily.    [provider]  Potassium Chloride ER 20 MEQ TBCR Take 20 mEq by mouth daily. 04/09/22   Tonye Becket D, NP      Allergies    Patient has no known allergies.    Review of Systems   Review of Systems  All other systems reviewed and are negative.   Physical Exam Updated Vital Signs BP (!) 102/49   Pulse 82   Temp 98.1 F (36.7 C) (Oral)   Resp 18   SpO2 95%  Physical Exam Vitals and nursing  note reviewed.  Constitutional:      General: She is not in acute distress.    Appearance: Normal appearance. She is well-developed. She is not toxic-appearing.  HENT:     Head: Normocephalic and atraumatic.  Eyes:     General: Lids are normal.     Conjunctiva/sclera: Conjunctivae normal.     Pupils: Pupils are equal, round, and reactive to light.  Neck:     Thyroid: No thyroid mass.     Trachea: No tracheal deviation.  Cardiovascular:     Rate and Rhythm: Normal rate and regular rhythm.     Heart sounds: Normal heart sounds. No murmur heard.    No gallop.  Pulmonary:     Effort: Pulmonary effort is normal. No respiratory distress.     Breath sounds: Normal breath sounds. No stridor. No decreased breath sounds, wheezing, rhonchi or rales.   Abdominal:     General: There is no distension.     Palpations: Abdomen is soft.     Tenderness: There is no abdominal tenderness. There is no rebound.  Musculoskeletal:        General: No tenderness. Normal range of motion.     Cervical back: Normal range of motion and neck supple.  Skin:    General: Skin is warm and dry.     Findings: No abrasion or rash.  Neurological:     Mental Status: She is alert and oriented to person, place, and time. Mental status is at baseline.     GCS: GCS eye subscore is 4. GCS verbal subscore is 5. GCS motor subscore is 6.     Cranial Nerves: No cranial nerve deficit.     Sensory: No sensory deficit.     Motor: Motor function is intact.  Psychiatric:        Attention and Perception: Attention normal.        Speech: Speech normal.        Behavior: Behavior normal.     ED Results / Procedures / Treatments   Labs (all labs ordered are listed, but only abnormal results are displayed) Labs Reviewed  BASIC METABOLIC PANEL - Abnormal; Notable for the following components:      Result Value   Glucose, Bld 139 (*)    Creatinine, Ser 1.03 (*)    GFR, Estimated 52 (*)    All other components within normal limits  CBC - Abnormal; Notable for the following components:   RDW 15.6 (*)    All other components within normal limits  TROPONIN I (HIGH SENSITIVITY)  TROPONIN I (HIGH SENSITIVITY)    EKG EKG Interpretation Date/Time:  Friday December 04 2023 14:45:00 EST Ventricular Rate:  103 PR Interval:    QRS Duration:  102 QT Interval:  385 QTC Calculation: 504 R Axis:   -19  Text Interpretation: Atrial fibrillation Borderline left axis deviation Low voltage, precordial leads Consider anterior infarct Borderline T abnormalities, inferior leads Prolonged QT interval No significant change since last tracing Confirmed by Lorre Nick ([id number]) on 12/04/2023 8:24:56 PM  Radiology DG Chest 2 View Result Date: 12/04/2023 CLINICAL DATA:  Chest pain.   Controlled atrial fibrillation. EXAM: CHEST - 2 VIEW COMPARISON:  11/29/2023 FINDINGS: Stable enlarged cardiac silhouette. Tortuous and partially calcified thoracic aorta. No significant change in a large left pleural effusion. Small amount of adjacent atelectasis with improvement. Interval mild ill-defined increased density at the right lung base. Thoracic spine degenerative changes with changes of DISH. IMPRESSION: 1. No significant change in  a large left pleural effusion. 2. Interval mild right basilar atelectasis or pneumonia. 3. Stable cardiomegaly. Electronically Signed   By: Beckie Salts M.D.   On: 12/04/2023 15:45    Procedures Procedures    Medications Ordered in ED Medications - No data to display  ED Course/ Medical Decision Making/ A&P                                 Medical Decision Making Amount and/or Complexity of Data Reviewed Labs: ordered. Radiology: ordered.   Patient here with recurrent chest pain.  Troponins are negative at this time x 2.  Chest x-ray per interpretation shows no acute findings.  EKG per my interpretation shows A-fib which is unchanged.  Due to patient's recurrent chest pain feel that she would likely require admission for medication management.  Will consult cardiology and likely admit to the hospital service        Final Clinical Impression(s) / ED Diagnoses Final diagnoses:  None    Rx / DC Orders ED Discharge Orders     None         Lorre Nick, MD 12/04/23 2042

## 2023-12-04 NOTE — ED Provider Triage Note (Signed)
Emergency Medicine Provider Triage Evaluation Note  Anne Roach , a 89 y.o. female  was evaluated in triage.  Pt complains of "heart beating fast" that started at 0600 following waking. She complains of non reproducible CP in triage. No SOB, trauma  Seen on 11/29/23 for CP with negative w/u  Review of Systems  Positive: "heart beating fast", CP Negative: SOB, fever  Physical Exam  BP 103/62 (BP Location: Left Arm)   Pulse 75   Temp (!) 97.5 F (36.4 C) (Oral)   Resp 18   SpO2 96%  Gen:   Awake, no distress   Resp:  Normal effort  MSK:   Moves extremities without difficulty  Other:    Medical Decision Making  Medically screening exam initiated at 2:36 PM.  Appropriate orders placed.  Anne Roach was informed that the remainder of the evaluation will be completed by another provider, this initial triage assessment does not replace that evaluation, and the importance of remaining in the ED until their evaluation is complete.  CP workup initiated   Judithann Sheen, [place] 12/04/23 1439

## 2023-12-04 NOTE — H&P (Signed)
History and Physical    Patient: Anne Roach MVH:[id number] DOB: 08/08/1933 DOA: 12/04/2023 DOS: the patient was seen and examined on 12/04/2023 PCP: Leilani Able, MD  Patient coming from: Home  Chief Complaint:  Chief Complaint  Patient presents with   Chest Pain   HPI: MARELLY RESKE is a 89 y.o. female with medical history significant of atrial fibrillation, anemia of chronic disease, chronic diastolic heart failure, osteoarthritis, colon cancer, diabetes, hypertension obstructive sleep apnea and tremors who presents to the ER with intermittent chest pain.  She had 1 episode that lasted almost 30 minutes.  It is associated with some diaphoresis.  Patient had similar episode about 5 days ago.  She is on Eliquis as well as beta-blocker.  Patient was seen in the ER.  EKG is unchanged.  Cardiac enzymes negative.  Case was discussed with cardiology fellow on-call who recommended admission for monitoring and adjustment of her home regimen.  Cardiology to follow in the morning.  Review of Systems: As mentioned in the history of present illness. All other systems reviewed and are negative. Past Medical History:  Diagnosis Date   A-fib [facility])    Acute on chronic diastolic CHF (congestive heart failure) (HCC) 03/27/2022   Anemia    Arthritis    Atrial fibrillation (HCC)    Atrial fibrillation with RVR (HCC) 03/20/2017   Cancer (HCC)    colon cancer   Chronic diastolic CHF (congestive heart failure) (HCC) 10/31/2020   DM (diabetes mellitus) (HCC)    HTN (hypertension)    Occasional tremors    on Inderal for this   Sepsis secondary to UTI (HCC) 07/11/2020   Sleep apnea    not used cpap in a long time per daughter   Tremor    Past Surgical History:  Procedure Laterality Date   APPENDECTOMY     BIOPSY  12/23/2022   Procedure: BIOPSY;  Surgeon: Kerin Salen, MD;  Location: [facility];  Service: Gastroenterology;;   BIOPSY  08/03/2023   Procedure: BIOPSY;  Surgeon: Kerin Salen, MD;   Location: [facility];  Service: Gastroenterology;;   CARPAL TUNNEL RELEASE     CATARACT EXTRACTION Bilateral    COLONOSCOPY WITH PROPOFOL N/A 08/03/2023   Procedure: COLONOSCOPY WITH PROPOFOL;  Surgeon: Kerin Salen, MD;  Location: [facility];  Service: Gastroenterology;  Laterality: N/A;   ESOPHAGEAL DILATION  12/23/2022   Procedure: ESOPHAGEAL DILATION;  Surgeon: Kerin Salen, MD;  Location: [facility];  Service: Gastroenterology;;   ESOPHAGOGASTRODUODENOSCOPY (EGD) WITH PROPOFOL N/A 12/23/2022   Procedure: ESOPHAGOGASTRODUODENOSCOPY (EGD) WITH PROPOFOL W/ DILATION;  Surgeon: Kerin Salen, MD;  Location: [facility];  Service: Gastroenterology;  Laterality: N/A;  with dilation   HERNIA REPAIR     LAPAROSCOPIC RIGHT HEMI COLECTOMY Right 10/08/2023   Procedure: LAPAROSCOPIC RIGHT HEMICOLECTOMY;  Surgeon: Andria Meuse, MD;  Location: [facility];  Service: General;  Laterality: Right;   POLYPECTOMY  08/03/2023   Procedure: POLYPECTOMY;  Surgeon: Kerin Salen, MD;  Location: [facility];  Service: Gastroenterology;;   tarsal tunnel surgery      Social History:  reports that she has never smoked. She has never used smokeless tobacco. She reports that she does not drink alcohol and does not use drugs.  No Known Allergies  Family History  Problem Relation Age of Onset   Diabetes Father    Asthma Brother    Cancer Daughter 75       BREAST CANCER    Prior to Admission medications   Medication Sig  Start Date End Date Taking? Authorizing Provider  ACCU-CHEK AVIVA PLUS test strip  10/06/20   [provider]  acetaminophen (TYLENOL) 500 MG tablet Take 1,000 mg by mouth every 6 (six) hours as needed for mild pain or headache.    [provider]  allopurinol (ZYLOPRIM) 100 MG tablet TAKE 1 TABLET(100 MG) BY MOUTH DAILY Patient taking differently: Take 100 mg by mouth daily. 08/09/21   Felecia Shelling, DPM  Cholecalciferol (VITAMIN D) 125 MCG (5000 UT) CAPS Take 5,000  Units by mouth daily with breakfast.    [provider]  diclofenac Sodium (VOLTAREN) 1 % GEL Apply 4 g topically 4 (four) times daily. 11/29/23   Melene Plan, DO  ELIQUIS 5 MG TABS tablet TAKE 1 TABLET(5 MG) BY MOUTH TWICE DAILY Patient taking differently: Take 5 mg by mouth 2 (two) times daily. 08/24/23   Wendall Stade, MD  empagliflozin (JARDIANCE) 10 MG TABS tablet Take 1 tablet (10 mg total) by mouth daily. 03/27/23   Wendall Stade, MD  folic acid (FOLVITE) 800 MCG tablet Take 800 mcg by mouth daily.    [provider]  furosemide (LASIX) 20 MG tablet Take 1 tablet (20 mg total) by mouth daily. 11/25/23   Gaston Islam., NP  gabapentin (NEURONTIN) 400 MG capsule Take 400 mg by mouth 3 (three) times daily. 03/25/22   [provider]  Iron-FA-B Cmp-C-Biot-Probiotic (FUSION PLUS) CAPS Take 1 capsule by mouth every other day. 08/26/23   [provider]  loratadine (CLARITIN) 10 MG tablet Take 10 mg by mouth daily as needed for allergies. 08/28/23   [provider]  metoprolol succinate (TOPROL XL) 50 MG 24 hr tablet Take 1 tablet (50 mg total) by mouth daily. Take with or immediately following a meal. 11/25/23   Gaston Islam., NP  metoprolol tartrate (LOPRESSOR) 25 MG tablet Take 1 tablet (25 mg total) by mouth daily as needed (palpitations). 11/25/23   Gaston Islam., NP  Multiple Vitamins-Minerals (ICAPS AREDS 2 PO) Take 1 capsule by mouth 2 (two) times daily.    [provider]  Potassium Chloride ER 20 MEQ TBCR Take 20 mEq by mouth daily. 04/09/22   Tonye Becket D, NP    Physical Exam: Vitals:   12/04/23 1338 12/04/23 1852  BP: 103/62 (!) 102/49  Pulse: 75 82  Resp: 18 18  Temp: (!) 97.5 F (36.4 C) 98.1 F (36.7 C)  TempSrc: Oral Oral  SpO2: 96% 95%   Constitutional: NAD, calm, comfortable Eyes: PERRL, lids and conjunctivae normal ENMT: Mucous membranes are moist. Posterior pharynx clear of any exudate or lesions.Normal  dentition.  Neck: normal, supple, no masses, no thyromegaly Respiratory: clear to auscultation bilaterally, no wheezing, no crackles. Normal respiratory effort. No accessory muscle use.  Cardiovascular: Irregularly irregular, no murmurs / rubs / gallops. No extremity edema. 2+ pedal pulses. No carotid bruits.  Abdomen: no tenderness, no masses palpated. No hepatosplenomegaly. Bowel sounds positive.  Musculoskeletal: Good range of motion, no joint swelling or tenderness, Skin: no rashes, lesions, ulcers. No induration Neurologic: CN 2-12 grossly intact. Sensation intact, DTR normal. Strength 5/5 in all 4.  Psychiatric: Normal judgment and insight. Alert and oriented x 3. Normal mood  Data Reviewed:  Vitals remained stable, creatinine 1.03, CBC largely within normal, troponin 11.  EKG showed atrial fibrillation which is unchanged.  Checks x-ray is negative.  Assessment and Plan:  #1 chest pain: Suspected bouts of A-fib with RVR versus  truly cardiac.  Patient will be admitted for observation.  Serial cardiac enzymes.  Will continue with her home regimen and have cardiology evaluate for adjustment of medications.  #2 chronic atrial fibrillation: Patient on Eliquis.  Rate appears controlled.  Continue to monitor.  #3 diabetes: Type II, initiate sliding scale insulin.  Monitor blood sugar  #4 essential hypertension: Continue chronic home regimen  #5 hyperlipidemia: Continue statin  #6 obstructive sleep apnea: CPAP at night    Advance Care Planning:   Code Status: Full Code   Consults: Cardiology consults  Family Communication: Sister at bedside  Severity of Illness: The appropriate patient status for this patient is OBSERVATION. Observation status is judged to be reasonable and necessary in order to provide the required intensity of service to ensure the patient's safety. The patient's presenting symptoms, physical exam findings, and initial radiographic and laboratory data in the  context of their medical condition is felt to place them at decreased risk for further clinical deterioration. Furthermore, it is anticipated that the patient will be medically stable for discharge from the hospital within 2 midnights of admission.   AuthorLonia Blood, MD 12/04/2023 9:59 PM  For on call review [email or web address].

## 2023-12-05 ENCOUNTER — Other Ambulatory Visit: Payer: Self-pay

## 2023-12-05 ENCOUNTER — Other Ambulatory Visit: Payer: Self-pay | Admitting: Cardiology

## 2023-12-05 ENCOUNTER — Encounter (HOSPITAL_COMMUNITY): Payer: Self-pay | Admitting: Internal Medicine

## 2023-12-05 DIAGNOSIS — N1831 Chronic kidney disease, stage 3a: Secondary | ICD-10-CM | POA: Diagnosis not present

## 2023-12-05 DIAGNOSIS — R072 Precordial pain: Secondary | ICD-10-CM

## 2023-12-05 DIAGNOSIS — I5032 Chronic diastolic (congestive) heart failure: Secondary | ICD-10-CM

## 2023-12-05 DIAGNOSIS — I4821 Permanent atrial fibrillation: Secondary | ICD-10-CM | POA: Diagnosis not present

## 2023-12-05 DIAGNOSIS — R079 Chest pain, unspecified: Secondary | ICD-10-CM | POA: Diagnosis not present

## 2023-12-05 DIAGNOSIS — E1122 Type 2 diabetes mellitus with diabetic chronic kidney disease: Secondary | ICD-10-CM | POA: Diagnosis not present

## 2023-12-05 DIAGNOSIS — I130 Hypertensive heart and chronic kidney disease with heart failure and stage 1 through stage 4 chronic kidney disease, or unspecified chronic kidney disease: Secondary | ICD-10-CM | POA: Diagnosis not present

## 2023-12-05 LAB — GLUCOSE, CAPILLARY
Glucose-Capillary: 108 mg/dL — ABNORMAL HIGH (ref 70–99)
Glucose-Capillary: 99 mg/dL (ref 70–99)

## 2023-12-05 MED ORDER — LORATADINE 10 MG PO TABS: 10.0000 mg | ORAL_TABLET | Freq: Every day | ORAL | Status: DC | PRN

## 2023-12-05 MED ORDER — FUROSEMIDE 20 MG PO TABS
20.0000 mg | ORAL_TABLET | Freq: Every day | ORAL | Status: DC
  Administered 2023-12-05: 20 mg via ORAL
  Filled 2023-12-05: qty 1

## 2023-12-05 MED ORDER — ACETAMINOPHEN 325 MG PO TABS
650.0000 mg | ORAL_TABLET | ORAL | Status: DC | PRN
  Administered 2023-12-05: 650 mg via ORAL
  Filled 2023-12-05: qty 2

## 2023-12-05 MED ORDER — METOPROLOL SUCCINATE ER 50 MG PO TB24
50.0000 mg | ORAL_TABLET | Freq: Every day | ORAL | Status: DC
  Administered 2023-12-05: 50 mg via ORAL
  Filled 2023-12-05: qty 1

## 2023-12-05 MED ORDER — FOLIC ACID 800 MCG PO TABS: 800.0000 ug | ORAL_TABLET | Freq: Every day | ORAL | Status: DC

## 2023-12-05 MED ORDER — FOLIC ACID 1 MG PO TABS
1.0000 mg | ORAL_TABLET | Freq: Every day | ORAL | Status: DC
  Administered 2023-12-05: 1 mg via ORAL
  Filled 2023-12-05: qty 1

## 2023-12-05 MED ORDER — INSULIN ASPART 100 UNIT/ML IJ SOLN: 0.0000 [IU] | Freq: Three times a day (TID) | INTRAMUSCULAR | Status: DC

## 2023-12-05 MED ORDER — EMPAGLIFLOZIN 10 MG PO TABS
10.0000 mg | ORAL_TABLET | Freq: Every day | ORAL | Status: DC
Start: 2023-12-05 — End: 2023-12-05
  Administered 2023-12-05: 10 mg via ORAL
  Filled 2023-12-05: qty 1

## 2023-12-05 MED ORDER — FUSION PLUS PO CAPS: 1.0000 | ORAL_CAPSULE | ORAL | Status: DC

## 2023-12-05 MED ORDER — METOPROLOL TARTRATE 25 MG PO TABS: 25.0000 mg | ORAL_TABLET | Freq: Every day | ORAL | Status: DC | PRN

## 2023-12-05 MED ORDER — ALLOPURINOL 100 MG PO TABS
100.0000 mg | ORAL_TABLET | Freq: Every day | ORAL | Status: DC
  Administered 2023-12-05: 100 mg via ORAL
  Filled 2023-12-05: qty 1

## 2023-12-05 MED ORDER — KETOROLAC TROMETHAMINE 15 MG/ML IJ SOLN
15.0000 mg | Freq: Once | INTRAMUSCULAR | Status: AC
  Administered 2023-12-05: 15 mg via INTRAVENOUS
  Filled 2023-12-05: qty 1

## 2023-12-05 MED ORDER — FE FUM-VIT C-VIT B12-FA 460-60-0.01-1 MG PO CAPS
1.0000 | ORAL_CAPSULE | ORAL | Status: DC
  Administered 2023-12-05: 1 via ORAL
  Filled 2023-12-05: qty 1

## 2023-12-05 MED ORDER — GABAPENTIN 400 MG PO CAPS
400.0000 mg | ORAL_CAPSULE | Freq: Three times a day (TID) | ORAL | Status: DC
  Administered 2023-12-05: 400 mg via ORAL
  Filled 2023-12-05: qty 1

## 2023-12-05 MED ORDER — APIXABAN 5 MG PO TABS
5.0000 mg | ORAL_TABLET | Freq: Two times a day (BID) | ORAL | Status: DC
  Administered 2023-12-05 (×2): 5 mg via ORAL
  Filled 2023-12-05 (×2): qty 1

## 2023-12-05 MED ORDER — INSULIN ASPART 100 UNIT/ML IJ SOLN: 0.0000 [IU] | Freq: Every day | INTRAMUSCULAR | Status: DC

## 2023-12-05 MED ORDER — ONDANSETRON HCL 4 MG/2ML IJ SOLN: 4.0000 mg | Freq: Four times a day (QID) | INTRAMUSCULAR | Status: DC | PRN

## 2023-12-05 MED ORDER — ACETAMINOPHEN 500 MG PO TABS: 1000.0000 mg | ORAL_TABLET | Freq: Four times a day (QID) | ORAL | Status: DC | PRN

## 2023-12-05 MED ORDER — POTASSIUM CHLORIDE CRYS ER 20 MEQ PO TBCR
20.0000 meq | EXTENDED_RELEASE_TABLET | Freq: Every day | ORAL | Status: DC
  Administered 2023-12-05: 20 meq via ORAL
  Filled 2023-12-05: qty 1

## 2023-12-05 MED ORDER — ORAL CARE MOUTH RINSE: 15.0000 mL | OROMUCOSAL | Status: DC | PRN

## 2023-12-05 MED ORDER — VITAMIN D 25 MCG (1000 UNIT) PO TABS
5000.0000 [IU] | ORAL_TABLET | Freq: Every day | ORAL | Status: DC
  Administered 2023-12-05: 5000 [IU] via ORAL
  Filled 2023-12-05 (×2): qty 5

## 2023-12-05 NOTE — Discharge Summary (Signed)
Physician Discharge Summary  Anne Roach NFA:[id number] DOB: 10/05/1933 DOA: 12/04/2023  PCP: Leilani Able, MD  Admit date: 12/04/2023 Discharge date: 12/05/2023  Admitted From: Home Disposition:  Home  Recommendations for Outpatient Follow-up:  Follow-up with cardiology for lexi scan Myoview on Wednesday, 11/07/2024 Can take extra dose of metoprolol as needed for tachycardia  Home Health: None Equipment/Devices: None Discharge Condition: Stable CODE STATUS: Full code Diet recommendation: Heart healthy diet  Brief/Interim Summary: Anne Roach is a 89 y.o. female with medical history significant of atrial fibrillation, anemia of chronic disease, chronic diastolic heart failure, osteoarthritis, colon cancer, diabetes, hypertension obstructive sleep apnea and tremors who presents to the ER with intermittent chest pain.  She had 1 episode that lasted almost 30 minutes.  It is associated with some diaphoresis.  Patient had similar episode about 5 days ago.  She is on Eliquis as well as beta-blocker.  Patient was seen in the ER.  EKG is unchanged.  Cardiac enzymes negative.  Cardiology consulted who recommended okay to discharge from their standpoint and Lexi scan Myoview scheduled on Wednesday.  She can take extra dose of metoprolol as needed for tachycardia.  Plan discussed with patient's daughter on the phone who agreed.  Discharge Diagnoses:   Chest pain ruled out ACS. Chronic A-fib Type 2 diabetes Essential hypertension Hyperlipidemia Obstructive sleep apnea CKD stage IIIa Gout Chronic diastolic CHF Recent partial colectomy for cecal cancer     Discharge Instructions  Discharge Instructions     Diet - low sodium heart healthy   Complete by: As directed    Increase activity slowly   Complete by: As directed       Allergies as of 12/05/2023   No Known Allergies      Medication List     TAKE these medications    Accu-Chek Aviva Plus test strip Generic  drug: glucose blood   acetaminophen 500 MG tablet Commonly known as: TYLENOL Take 1,000 mg by mouth every 6 (six) hours as needed for mild pain or headache.   allopurinol 100 MG tablet Commonly known as: ZYLOPRIM TAKE 1 TABLET(100 MG) BY MOUTH DAILY What changed: See the new instructions.   diclofenac Sodium 1 % Gel Commonly known as: VOLTAREN Apply 4 g topically 4 (four) times daily.   Eliquis 5 MG Tabs tablet Generic drug: apixaban TAKE 1 TABLET(5 MG) BY MOUTH TWICE DAILY What changed: See the new instructions.   empagliflozin 10 MG Tabs tablet Commonly known as: JARDIANCE Take 1 tablet (10 mg total) by mouth daily.   folic acid 800 MCG tablet Commonly known as: FOLVITE Take 800 mcg by mouth daily.   furosemide 20 MG tablet Commonly known as: LASIX Take 1 tablet (20 mg total) by mouth daily.   Fusion Plus Caps Take 1 capsule by mouth every other day.   gabapentin 400 MG capsule Commonly known as: NEURONTIN Take 400 mg by mouth 3 (three) times daily.   ICAPS AREDS 2 PO Take 1 capsule by mouth 2 (two) times daily.   loratadine 10 MG tablet Commonly known as: CLARITIN Take 10 mg by mouth daily as needed for allergies.   metoprolol succinate 50 MG 24 hr tablet Commonly known as: Toprol XL Take 1 tablet (50 mg total) by mouth daily. Take with or immediately following a meal.   metoprolol tartrate 25 MG tablet Commonly known as: LOPRESSOR Take 1 tablet (25 mg total) by mouth daily as needed (palpitations).   Potassium Chloride ER 20 MEQ Tbcr  Take 20 mEq by mouth daily.   Vitamin D 125 MCG (5000 UT) Caps Take 5,000 Units by mouth daily with breakfast.        No Known Allergies  Consultations: Cardiology   Procedures/Studies: DG Chest 2 View Result Date: 12/04/2023 CLINICAL DATA:  Chest pain.  Controlled atrial fibrillation. EXAM: CHEST - 2 VIEW COMPARISON:  11/29/2023 FINDINGS: Stable enlarged cardiac silhouette. Tortuous and partially calcified  thoracic aorta. No significant change in a large left pleural effusion. Small amount of adjacent atelectasis with improvement. Interval mild ill-defined increased density at the right lung base. Thoracic spine degenerative changes with changes of DISH. IMPRESSION: 1. No significant change in a large left pleural effusion. 2. Interval mild right basilar atelectasis or pneumonia. 3. Stable cardiomegaly. Electronically Signed   By: Beckie Salts M.D.   On: 12/04/2023 15:45   DG Chest 2 View Result Date: 11/29/2023 CLINICAL DATA:  Chest pain EXAM: CHEST - 2 VIEW COMPARISON:  11/21/2023 FINDINGS: The cardio pericardial silhouette is enlarged. Moderate to large left pleural effusion again noted. Interval decrease in right base collapse/consolidation and effusion. Interstitial markings are diffusely coarsened with chronic features. No acute bony abnormality. Telemetry leads overlie the chest. IMPRESSION: Moderate to large left pleural effusion, similar to prior. Electronically Signed   By: Kennith Center M.D.   On: 11/29/2023 12:26   DG Chest 1 View Result Date: 11/21/2023 CLINICAL DATA:  Mild shortness of breath.  Palpitations EXAM: CHEST  1 VIEW COMPARISON:  07/02/2023 FINDINGS: Haziness of the left more than right lower chest with pleural effusion appearance at the edges. Cardiopericardial enlargement, there has been pericardial effusion by prior CT imaging. No pneumothorax. IMPRESSION: 1. Left more than right pleural effusion obscuring the lower lungs. 2. Chronic cardiopericardial enlargement. Pericardial effusion has been seen on prior studies. Electronically Signed   By: Tiburcio Pea M.D.   On: 11/21/2023 10:11      Subjective: Patient seen and examined.  Sitting comfortably on the bed, eating breakfast.  Reports that she had some chest discomfort earlier that have resolved now.  No shortness of breath, palpitations, lightheadedness or dizziness.  Discharge Exam: Vitals:   12/05/23 0420 12/05/23 0809   BP: (!) 155/83 (!) 151/82  Pulse: 92 90  Resp: 16 18  Temp: 97.8 F (36.6 C)   SpO2: 95% 97%   Vitals:   12/05/23 0023 12/05/23 0035 12/05/23 0420 12/05/23 0809  BP:  (!) 137/90 (!) 155/83 (!) 151/82  Pulse:  66 92 90  Resp:  16 16 18   Temp:  (!) 97.4 F (36.3 C) 97.8 F (36.6 C)   TempSrc:   Oral Oral  SpO2:  96% 95% 97%  Weight: 69.2 kg     Height: 5\' 5"  (1.651 m)       General: Pt is alert, awake, not in acute distress, on room air, communicating well Cardiovascular: RRR, S1/S2 +, no rubs, no gallops Respiratory: CTA bilaterally, no wheezing, no rhonchi Abdominal: Soft, NT, ND, bowel sounds + Extremities: no edema, no cyanosis    The results of significant diagnostics from this hospitalization (including imaging, microbiology, ancillary and laboratory) are listed below for reference.     Microbiology: No results found for this or any previous visit (from the past 240 hours).   Labs: BNP (last 3 results) Recent Labs    11/21/23 1230  BNP 704.9*   Basic Metabolic Panel: Recent Labs  Lab 11/29/23 1327 12/04/23 1459  NA 141 139  K 4.0 3.9  CL 103 101  CO2 25 28  GLUCOSE 96 139*  BUN 12 17  CREATININE 1.01* 1.03*  CALCIUM 8.8* 9.3   Liver Function Tests: No results for input(s): "AST", "ALT", "ALKPHOS", "BILITOT", "PROT", "ALBUMIN" in the last 168 hours. No results for input(s): "LIPASE", "AMYLASE" in the last 168 hours. No results for input(s): "AMMONIA" in the last 168 hours. CBC: Recent Labs  Lab 11/29/23 1157 12/04/23 1459  WBC 5.8 6.7  HGB 10.2* 13.0  HCT 32.7* 42.5  MCV 96.7 97.7  PLT 231 213   Cardiac Enzymes: No results for input(s): "CKTOTAL", "CKMB", "CKMBINDEX", "TROPONINI" in the last 168 hours. BNP: Invalid input(s): "POCBNP" CBG: Recent Labs  Lab 11/29/23 1216 12/05/23 0030 12/05/23 0811  GLUCAP 96 99 108*   D-Dimer No results for input(s): "DDIMER" in the last 72 hours. Hgb A1c No results for input(s): "HGBA1C" in  the last 72 hours. Lipid Profile No results for input(s): "CHOL", "HDL", "LDLCALC", "TRIG", "CHOLHDL", "LDLDIRECT" in the last 72 hours. Thyroid function studies No results for input(s): "TSH", "T4TOTAL", "T3FREE", "THYROIDAB" in the last 72 hours.  Invalid input(s): "FREET3" Anemia work up No results for input(s): "VITAMINB12", "FOLATE", "FERRITIN", "TIBC", "IRON", "RETICCTPCT" in the last 72 hours. Urinalysis    Component Value Date/Time   COLORURINE STRAW (A) 11/23/2022 1315   APPEARANCEUR HAZY (A) 11/23/2022 1315   LABSPEC 1.006 11/23/2022 1315   PHURINE 5.0 11/23/2022 1315   GLUCOSEU >=500 (A) 11/23/2022 1315   HGBUR NEGATIVE 11/23/2022 1315   BILIRUBINUR NEGATIVE 11/23/2022 1315   KETONESUR NEGATIVE 11/23/2022 1315   PROTEINUR NEGATIVE 11/23/2022 1315   UROBILINOGEN 0.2 06/12/2011 1201   NITRITE NEGATIVE 11/23/2022 1315   LEUKOCYTESUR LARGE (A) 11/23/2022 1315   Sepsis Labs Recent Labs  Lab 11/29/23 1157 12/04/23 1459  WBC 5.8 6.7   Microbiology No results found for this or any previous visit (from the past 240 hours).   Time coordinating discharge: Over 30 minutes  SIGNED:   Ollen Bowl, MD  [facility] 12/05/2023, 10:33 AM Pager   If 7PM-7AM, please contact night-coverage [email or web address]

## 2023-12-05 NOTE — Progress Notes (Signed)
EKG performed - MD notified to view results.

## 2023-12-05 NOTE — Progress Notes (Signed)
Ordered a nuclear stress test on behalf of Dr. Royann Shivers- Per Dr. Royann Shivers, he consented patient and daughter while they were at [facility] hospital. I messaged our office staff to arrange study on Wednesday, 12/09/23.   Jonita Albee, PA-C 12/05/2023 1:24 PM

## 2023-12-05 NOTE — Progress Notes (Addendum)
Cardiology Consultation   Patient ID: Anne Roach MRN: [id number]; DOB: 08/09/1933  Admit date: 12/04/2023 Date of Consult: 12/05/2023  PCP:  Leilani Able, MD   [facility] Providers Cardiologist:  [name], MD        Patient Profile:   Anne Roach is a 89 y.o. female with a hx of permanent atrial fibrillation who is being seen 12/05/2023 for the evaluation of chest pain at the request of Dr. Jacqulyn Bath.  History of Present Illness:   [name] is an 89 year old woman with permanent atrial fibrillation, history of chronic diastolic heart failure, recent partial colectomy for cecal cancer, hypertension, OSA no longer on CPAP, who was admitted for complaints of chest pain.  Problems the chest discomfort and palpitation seem to occur when her long-term prescription for metoprolol succinate 50 mg daily was stopped.  This followed her abdominal surgery in an attempt to use propranolol to treat tremors.  She had low blood pressure when taking both propranolol and metoprolol.  She has been back on her usual prescription for about a week now, but despite this is still troubled by palpitations.  Sometimes when the palpitations are persistent she develops a sharp left-sided chest discomfort which she describes as "a knife".  A longer episode that went on for 30 minutes led to the current hospitalization.  Cardiac enzymes have been normal and her ECG during symptoms was also normal (except for the chronic atrial fibrillation).  She did well overnight but had some recurrent chest discomfort this morning that lasted for only 3 minutes.  Another electrocardiogram was performed and again shows no ischemic changes.  While I was examining and interviewing her she appeared very comfortable and was smiling.  Towards the end of our discussion when I told her that I think further workup can be performed as an outpatient she became nervous experienced palpitations and chest discomfort.  The  whole episode was over in less than 60 seconds.  She has had some increasing problems with anxiety about being alone at night recently.  Her last evaluation for coronary disease was a normal nuclear stress test in 2012.  I reviewed a CT of her chest performed 07/02/2023.  There is some aortic atherosclerosis and there is some atherosclerotic calcification in the coronary arteries (which I would describe as mild for her age (nongated study).  Past Medical History:  Diagnosis Date   A-fib [facility])    Acute on chronic diastolic CHF (congestive heart failure) (HCC) 03/27/2022   Anemia    Arthritis    Atrial fibrillation (HCC)    Atrial fibrillation with RVR (HCC) 03/20/2017   Cancer (HCC)    colon cancer   Chronic diastolic CHF (congestive heart failure) (HCC) 10/31/2020   DM (diabetes mellitus) (HCC)    HTN (hypertension)    Occasional tremors    on Inderal for this   Sepsis secondary to UTI (HCC) 07/11/2020   Sleep apnea    not used cpap in a long time per daughter   Tremor     Past Surgical History:  Procedure Laterality Date   APPENDECTOMY     BIOPSY  12/23/2022   Procedure: BIOPSY;  Surgeon: Kerin Salen, MD;  Location: [facility];  Service: Gastroenterology;;   BIOPSY  08/03/2023   Procedure: BIOPSY;  Surgeon: Kerin Salen, MD;  Location: [facility];  Service: Gastroenterology;;   CARPAL TUNNEL RELEASE     CATARACT EXTRACTION Bilateral    COLONOSCOPY WITH PROPOFOL N/A 08/03/2023  Procedure: COLONOSCOPY WITH PROPOFOL;  Surgeon: Kerin Salen, MD;  Location: [facility];  Service: Gastroenterology;  Laterality: N/A;   ESOPHAGEAL DILATION  12/23/2022   Procedure: ESOPHAGEAL DILATION;  Surgeon: Kerin Salen, MD;  Location: [facility];  Service: Gastroenterology;;   ESOPHAGOGASTRODUODENOSCOPY (EGD) WITH PROPOFOL N/A 12/23/2022   Procedure: ESOPHAGOGASTRODUODENOSCOPY (EGD) WITH PROPOFOL W/ DILATION;  Surgeon: Kerin Salen, MD;  Location: [facility];  Service: Gastroenterology;   Laterality: N/A;  with dilation   HERNIA REPAIR     LAPAROSCOPIC RIGHT HEMI COLECTOMY Right 10/08/2023   Procedure: LAPAROSCOPIC RIGHT HEMICOLECTOMY;  Surgeon: Andria Meuse, MD;  Location: [facility];  Service: General;  Laterality: Right;   POLYPECTOMY  08/03/2023   Procedure: POLYPECTOMY;  Surgeon: Kerin Salen, MD;  Location: [facility];  Service: Gastroenterology;;   tarsal tunnel surgery          Inpatient Medications: Scheduled Meds:  allopurinol  100 mg Oral Daily   apixaban  5 mg Oral BID   cholecalciferol  5,000 Units Oral Q breakfast   empagliflozin  10 mg Oral Daily   Fe Fum-Vit C-Vit B12-FA  1 capsule Oral QODAY   folic acid  1 mg Oral Daily   furosemide  20 mg Oral Daily   gabapentin  400 mg Oral TID   insulin aspart  0-15 Units Subcutaneous TID WC   insulin aspart  0-5 Units Subcutaneous QHS   metoprolol succinate  50 mg Oral Daily   potassium chloride SA  20 mEq Oral Daily   Continuous Infusions:  PRN Meds: acetaminophen, loratadine, ondansetron (ZOFRAN) IV, mouth rinse  Allergies:   No Known Allergies  Social History:   Social History   Socioeconomic History   Marital status: Widowed    Spouse name: Not on file   Number of children: 2   Years of education: 12   Highest education level: High school graduate  Occupational History    Comment: Retired    Comment: retired  Tobacco Use   Smoking status: Never    Passive exposure: Never   Smokeless tobacco: Never   Tobacco comments:    Never smoked 10/29/23  Vaping Use   Vaping status: Never Used  Substance and Sexual Activity   Alcohol use: No    Alcohol/week: 0.0 standard drinks of alcohol   Drug use: No   Sexual activity: Not on file  Other Topics Concern   Not on file  Social History Narrative   Speaks English and Arabic. Patient lives at home with her daughter Anne Roach). Patient is widowed.   Patient is retired.   Education high school.   Right handed.   Two children.   Caffeine  None   Social Drivers of Corporate investment banker Strain: Low Risk  (03/28/2022)   Overall Financial Resource Strain (CARDIA)    Difficulty of Paying Living Expenses: Not hard at all  Food Insecurity: No Food Insecurity (12/05/2023)   Hunger Vital Sign    Worried About Running Out of Food in the Last Year: Never true    Ran Out of Food in the Last Year: Never true  Transportation Needs: No Transportation Needs (12/05/2023)   PRAPARE - Administrator, Civil Service (Medical): No    Lack of Transportation (Non-Medical): No  Physical Activity: Not on file  Stress: Not on file  Social Connections: Patient Unable To Answer (12/05/2023)   Social Connection and Isolation Panel [NHANES]    Frequency of Communication with Friends and Family: Patient unable  to answer    Frequency of Social Gatherings with Friends and Family: Patient unable to answer    Attends Religious Services: Patient unable to answer    Active Member of Clubs or Organizations: Patient unable to answer    Attends Banker Meetings: Patient unable to answer    Marital Status: Patient unable to answer  Intimate Partner Violence: Not At Risk (12/05/2023)   Humiliation, Afraid, Rape, and Kick questionnaire    Fear of Current or Ex-Partner: No    Emotionally Abused: No    Physically Abused: No    Sexually Abused: No    Family History:    Family History  Problem Relation Age of Onset   Diabetes Father    Asthma Brother    Cancer Daughter 67       BREAST CANCER     ROS:  Please see the history of present illness.   All other ROS reviewed and negative.     Physical Exam/Data:   Vitals:   12/05/23 0023 12/05/23 0035 12/05/23 0420 12/05/23 0809  BP:  (!) 137/90 (!) 155/83 (!) 151/82  Pulse:  66 92 90  Resp:  16 16 18   Temp:  (!) 97.4 F (36.3 C) 97.8 F (36.6 C)   TempSrc:   Oral Oral  SpO2:  96% 95% 97%  Weight: 69.2 kg     Height: 5\' 5"  (1.651 m)      No intake or output data in the  24 hours ending 12/05/23 1024    12/05/2023   12:23 AM 11/29/2023   11:32 AM 11/25/2023    8:49 AM  Last 3 Weights  Weight (lbs) 152 lb 7.9 oz 152 lb 7.9 oz 152 lb 8 oz  Weight (kg) 69.17 kg 69.17 kg 69.174 kg     Body mass index is 25.38 kg/m.  General:  Well nourished, well developed, in no acute distress HEENT: normal Neck: no JVD Vascular: No carotid bruits; Distal pulses 2+ bilaterally Cardiac:  normal S1, S2; irregular; no murmur  Lungs:  clear to auscultation bilaterally, no wheezing, rhonchi or rales  Abd: soft, nontender, no hepatomegaly  Ext: no edema Musculoskeletal:  No deformities, BUE and BLE strength normal and equal Skin: warm and dry  Neuro:  CNs 2-12 intact, no focal abnormalities noted Psych:  Normal affect   EKG:  The EKG was personally reviewed and demonstrates: Atrial fibrillation with mild RVR, no ischemic repolarization abnormalities Telemetry:  Telemetry was personally reviewed and demonstrates: Atrial fibrillation, largely well rate controlled.  Relevant CV Studies:  1. Left ventricular ejection fraction, by estimation, is 50 to 55%. The  left ventricle has low normal function. The left ventricle demonstrates  global hypokinesis. There is mild left ventricular hypertrophy. Left  ventricular diastolic function could not   be evaluated. There is incoordinate septal motion.   2. Right ventricular systolic function is normal. The right ventricular  size is normal. There is mildly elevated pulmonary artery systolic  pressure. The estimated right ventricular systolic pressure is 40.9 mmHg.   3. Left atrial size was severely dilated.   4. Right atrial size was moderately dilated.   5. A small pericardial effusion is present. The pericardial effusion is  posterior to the left ventricle.   6. The mitral valve is abnormal. Mild mitral valve regurgitation.   7. The aortic valve is tricuspid. Aortic valve regurgitation is not  visualized. Aortic valve sclerosis is  present, with no evidence of aortic  valve stenosis.  8. The inferior vena cava is normal in size with greater than 50%  respiratory variability, suggesting right atrial pressure of 3 mmHg.   Comparison(s): No significant change from prior study. 11/01/2020: LVEF  50-55%.   Laboratory Data:  High Sensitivity Troponin:   Recent Labs  Lab 11/21/23 1200 11/29/23 1157 11/29/23 1327 12/04/23 1459 12/04/23 1927  TROPONINIHS 12 11 14 11 11      Chemistry Recent Labs  Lab 11/29/23 1327 12/04/23 1459  NA 141 139  K 4.0 3.9  CL 103 101  CO2 25 28  GLUCOSE 96 139*  BUN 12 17  CREATININE 1.01* 1.03*  CALCIUM 8.8* 9.3  GFRNONAA 54* 52*  ANIONGAP 13 10    No results for input(s): "PROT", "ALBUMIN", "AST", "ALT", "ALKPHOS", "BILITOT" in the last 168 hours. Lipids No results for input(s): "CHOL", "TRIG", "HDL", "LABVLDL", "LDLCALC", "CHOLHDL" in the last 168 hours.  Hematology Recent Labs  Lab 11/29/23 1157 12/04/23 1459  WBC 5.8 6.7  RBC 3.38* 4.35  HGB 10.2* 13.0  HCT 32.7* 42.5  MCV 96.7 97.7  MCH 30.2 29.9  MCHC 31.2 30.6  RDW 15.8* 15.6*  PLT 231 213   Thyroid No results for input(s): "TSH", "FREET4" in the last 168 hours.  BNPNo results for input(s): "BNP", "PROBNP" in the last 168 hours.  DDimer No results for input(s): "DDIMER" in the last 168 hours.   Radiology/Studies:  DG Chest 2 View Result Date: 12/04/2023 CLINICAL DATA:  Chest pain.  Controlled atrial fibrillation. EXAM: CHEST - 2 VIEW COMPARISON:  11/29/2023 FINDINGS: Stable enlarged cardiac silhouette. Tortuous and partially calcified thoracic aorta. No significant change in a large left pleural effusion. Small amount of adjacent atelectasis with improvement. Interval mild ill-defined increased density at the right lung base. Thoracic spine degenerative changes with changes of DISH. IMPRESSION: 1. No significant change in a large left pleural effusion. 2. Interval mild right basilar atelectasis or  pneumonia. 3. Stable cardiomegaly. Electronically Signed   By: Beckie Salts M.D.   On: 12/04/2023 15:45     Assessment and Plan:   Chest pain: The description is atypical for angina pectoris.  My impression is that she feels uncomfortable when her ventricular rates are faster.  Obviously at her age it is possible that she could have developed coronary artery disease, but there are no acute ischemic changes on the ECG and the biomarkers are benign.  While I think it is reasonable to pursue another evaluation for coronary insufficiency, I think this can be safely performed as an outpatient.  CT angiography is not a good option due to the irregular rhythm.  Will schedule her for a Lexiscan Myoview next week. Afib: Ventricular rate control has been a little more erratic recently.  I think to some degree this could be related to anxiety and emotional stress.  It is fine for her to take an extra dose of metoprolol tartrate 25 mg once twice or even 3 times a day if necessary, as long as her blood pressure is not less than 100/60.  Discussed this with her daughter in some detail over the phone.  Continue anticoagulation. CHF: Preserved EF, appears clinically euvolemic, NYHA functional class II.   Risk Assessment/Risk Scores:        New York Heart Association (NYHA) Functional Class NYHA Class II  CHA2DS2-VASc Score = 6   This indicates a 9.7% annual risk of stroke. The patient's score is based upon: CHF History: 1 HTN History: 1 Diabetes History: 1 Stroke History:  0 Vascular Disease History: 0 Age Score: 2 Gender Score: 1     [facility] will sign off.   Medication Recommendations: Continue current medications.  Can take metoprolol tartrate 25 mg as often as 1-3 times daily on top of her metoprolol succinate, as needed for palpitations. Other recommendations (labs, testing, etc): Lexiscan Myoview next week.  Her daughter prefers Wednesday. Follow up as an outpatient: Will arrange  earlier outpatient follow-up if necessary, based on the results of the nuclear scan.  Otherwise she already has a follow-up appointment scheduled with Dr. Eden Emms 03/23/2024  For questions or updates, please contact [facility] Please consult [email or web address] for contact info under    Signed, Thurmon Fair, MD  12/05/2023 10:24 AM

## 2023-12-05 NOTE — Plan of Care (Signed)
  Problem: Education: Goal: Knowledge of General Education information will improve Description: Including pain rating scale, medication(s)/side effects and non-pharmacologic comfort measures Outcome: Progressing   Problem: Health Behavior/Discharge Planning: Goal: Ability to manage health-related needs will improve Outcome: Progressing   Problem: Clinical Measurements: Goal: Ability to maintain clinical measurements within normal limits will improve Outcome: Progressing Goal: Will remain free from infection Outcome: Progressing Goal: Diagnostic test results will improve Outcome: Progressing Goal: Respiratory complications will improve Outcome: Progressing Goal: Cardiovascular complication will be avoided Outcome: Progressing   Problem: Activity: Goal: Risk for activity intolerance will decrease Outcome: Progressing   Problem: Nutrition: Goal: Adequate nutrition will be maintained Outcome: Progressing   Problem: Coping: Goal: Level of anxiety will decrease Outcome: Progressing   Problem: Elimination: Goal: Will not experience complications related to bowel motility Outcome: Progressing Goal: Will not experience complications related to urinary retention Outcome: Progressing   Problem: Pain Managment: Goal: General experience of comfort will improve and/or be controlled Outcome: Progressing   Problem: Safety: Goal: Ability to remain free from injury will improve Outcome: Progressing   Problem: Skin Integrity: Goal: Risk for impaired skin integrity will decrease Outcome: Progressing   Problem: Education: Goal: Ability to describe self-care measures that may prevent or decrease complications (Diabetes Survival Skills Education) will improve Outcome: Progressing Goal: Individualized Educational Video(s) Outcome: Progressing   Problem: Coping: Goal: Ability to adjust to condition or change in health will improve Outcome: Progressing   Problem: Fluid  Volume: Goal: Ability to maintain a balanced intake and output will improve Outcome: Progressing   Problem: Health Behavior/Discharge Planning: Goal: Ability to identify and utilize available resources and services will improve Outcome: Progressing Goal: Ability to manage health-related needs will improve Outcome: Progressing   Problem: Metabolic: Goal: Ability to maintain appropriate glucose levels will improve Outcome: Progressing   Problem: Nutritional: Goal: Maintenance of adequate nutrition will improve Outcome: Progressing Goal: Progress toward achieving an optimal weight will improve Outcome: Progressing   Problem: Skin Integrity: Goal: Risk for impaired skin integrity will decrease Outcome: Progressing   Problem: Tissue Perfusion: Goal: Adequacy of tissue perfusion will improve Outcome: Progressing   Problem: Education: Goal: Understanding of cardiac disease, CV risk reduction, and recovery process will improve Outcome: Progressing Goal: Individualized Educational Video(s) Outcome: Progressing   Problem: Activity: Goal: Ability to tolerate increased activity will improve Outcome: Progressing   Problem: Cardiac: Goal: Ability to achieve and maintain adequate cardiovascular perfusion will improve Outcome: Progressing   Problem: Health Behavior/Discharge Planning: Goal: Ability to safely manage health-related needs after discharge will improve Outcome: Progressing

## 2023-12-07 ENCOUNTER — Telehealth (HOSPITAL_COMMUNITY): Payer: Self-pay | Admitting: *Deleted

## 2023-12-07 NOTE — Telephone Encounter (Signed)
Patient given detailed instructions per Myocardial Perfusion Study Information Sheet for the test on 12/09/23 Patient notified to arrive 15 minutes early and that it is imperative to arrive on time for appointment to keep from having the test rescheduled.  If you need to cancel or reschedule your appointment, please call the office within 24 hours of your appointment. . Patient verbalized understanding.Ricky Ala

## 2023-12-09 ENCOUNTER — Ambulatory Visit (HOSPITAL_COMMUNITY): Payer: Medicare HMO | Attending: Cardiology

## 2023-12-09 DIAGNOSIS — R079 Chest pain, unspecified: Secondary | ICD-10-CM | POA: Diagnosis present

## 2023-12-09 LAB — MYOCARDIAL PERFUSION IMAGING
LV dias vol: 65 mL (ref 46–106)
LV sys vol: 45 mL
Nuc Stress EF: 31 %
Peak HR: 113 {beats}/min
Rest HR: 97 {beats}/min
Rest Nuclear Isotope Dose: 11 mCi
SDS: 3
SRS: 1
SSS: 4
ST Depression (mm): 0 mm
Stress Nuclear Isotope Dose: 30.6 mCi
TID: 0.97

## 2023-12-09 MED ORDER — TECHNETIUM TC 99M TETROFOSMIN IV KIT
30.6000 | PACK | Freq: Once | INTRAVENOUS | Status: AC | PRN
Start: 2023-12-09 — End: 2023-12-09
  Administered 2023-12-09: 30.6 via INTRAVENOUS

## 2023-12-09 MED ORDER — REGADENOSON 0.4 MG/5ML IV SOLN
0.4000 mg | Freq: Once | INTRAVENOUS | Status: AC
  Administered 2023-12-09: 0.4 mg via INTRAVENOUS

## 2023-12-09 MED ORDER — TECHNETIUM TC 99M TETROFOSMIN IV KIT
11.0000 | PACK | Freq: Once | INTRAVENOUS | Status: AC | PRN
  Administered 2023-12-09: 11 via INTRAVENOUS

## 2023-12-16 ENCOUNTER — Telehealth: Payer: Self-pay

## 2023-12-16 DIAGNOSIS — I5031 Acute diastolic (congestive) heart failure: Secondary | ICD-10-CM

## 2023-12-16 DIAGNOSIS — R079 Chest pain, unspecified: Secondary | ICD-10-CM

## 2023-12-16 NOTE — Telephone Encounter (Signed)
Called patient advised of below she verbalized understanding Ordered Echo

## 2023-12-16 NOTE — Telephone Encounter (Signed)
-----   Message from Jonita Albee sent at 12/10/2023  1:57 PM EST ----- Please tell patient that her nuclear stress test showed no reversible ischemia (blockages in the blood vessels in the heart). However, study suggested that her heart is squeezing a bit weak (EF reduced). Nuclear stress tests are not the best way to assess EF (heart muscle squeeze). Recommend she undergo echocardiogram for evaluation of reduced EF.  Please order and arrange echo- diagnosis chest pain, diastolic heart failure   Thanks KJ

## 2023-12-22 ENCOUNTER — Other Ambulatory Visit: Payer: Self-pay | Admitting: Cardiovascular Disease

## 2023-12-25 NOTE — Consults (Signed)
 Nutrition Note   PATIENT NAME: Anne Roach DATE: 12/25/2023  TIME: 3:24 PM  Note Type: Assessment Referral Reason: Nutrition consult  Assessment   Pt is an 89 yo female with PMHx of afib, HFrEF, colon cancer s/p lap hemicolectomy (887975), known esophageal dysmotility, with LOS day 4 presenting from home with chest pain, N/V and respiratory failure in setting of left pleural effusion  Pt off unit this AM for EGD in setting of vomiting following meal intake yesterday. EGD unremarkable this AM aside from gastritis. Daughter feeding pt lunch tray later on in day, pt taking small spoonfuls. Per documented weights, stable PTA. Plan for outpt GI follow up.     Interventions  Nutrition Intervention: General healthful diet, Medical food supplements  Continue soft diet as tolerated Will send Ensure Plus BID  Can consider outpt RD follow up pending further GI work up, can consider placement of feeding tube if unable to sustain nutrition via PO in setting of esophageal dysmotility, if within pt's goals of care    Nutrition Diagnosis                        Nutrition Diagnosis: Limited food acceptance Problem Related To: Dysphagia Problem as Evidenced By: Soft diet, with intermittent emesis, in setting of known esophageal dysmotility       Nutrition Focused Physical Findings  Deferred Due To: NFPE not indicated     Pertinent Information  Anthropometrics Height: 170.2 cm (5' 7) Weight: 70.3 kg (155 lb)  Wt Readings from Last 10 Encounters:  12/25/23 70.3 kg (155 lb)    Per Care Everywhere: 12/05/23: 69.2 kg  10/11/23: 77 kg  08/26/23: 70.6 kg 07/07/23: 69.8 kg  Weight Classification: Normal, For adult > 38 years old BMI: 25.2  Skin Integrity: No pressure injuries per nursing documentation  Nutrition Related Medications: . Scheduled Meds: allopurinoL , 100 mg, oral, Daily amLODIPine , 2.5 mg, oral, Daily apixaban , 5 mg, oral, BID folic acid , 400 mcg, oral,  Daily [Held by provider] furosemide , 20 mg, oral, Daily [Held by provider] gabapentin , 400 mg, oral, TID metoclopramide , 10 mg, intravenous, Before meals & nightly metoprolol  tartrate, 25 mg, oral, Q6H ondansetron , 8 mg, intravenous, Q6H pantoprazole , 40 mg, intravenous, BID traMADoL , 25 mg, oral, Once   Labs: . Lab Results  Component Value Date   GLUCOSE 113 (H) 12/25/2023   CALCIUM 8.1 (L) 12/25/2023   NA 138 12/25/2023   K 3.3 (L) 12/25/2023   CO2 25 12/25/2023   CL 106 12/25/2023   BUN 11 12/25/2023   CREATININE 1.19 12/25/2023   Lab Results  Component Value Date   CALCIUM 8.1 (L) 12/25/2023    GI Symptoms: None Last BM Date: 12/24/23    Current Diet and Nutrition Support Details  Diet NPO---> advanced to soft and bite sized with thin liquids       Estimated Needs  Nutrition Needs Based On: Actual weight 70 kg (154 lb 5.2 oz)  Calories (kcal/day): 1750  Calories based on: 25-30 kcal/kg  Protein (g/day): 85  Protein based on: 1.2-1.5g/kg  Fluid (mL/day):   1-1.1 mL/kcal      Monitoring & Evaluation  Nutrition Goals: Adequate PO, Improve tolerance Nutrition Goal Status: Initial   Follow-up Information  Follow-Up Date: 01/01/24 Follow-Up Reason: Reassessment    Izetta Reese, RD 12/25/2023 3:24 PM  Contact RD on treatment team. After hours or weekends, use secure chat group: Daniel  [facility] [place]  [facility] [place]  [facility]  [facility] [place]  [facility]

## 2024-01-06 ENCOUNTER — Ambulatory Visit (HOSPITAL_COMMUNITY): Payer: Medicare HMO

## 2024-01-07 IMAGING — CR DG CHEST 2V
2 series · 2 of 2 positions shown · non-contrast
Comparison: Chest radiograph and chest CT 03/19/2022

CLINICAL DATA: Shortness of breath for 2 weeks

EXAM:
CHEST - 2 VIEW

[chest lat]
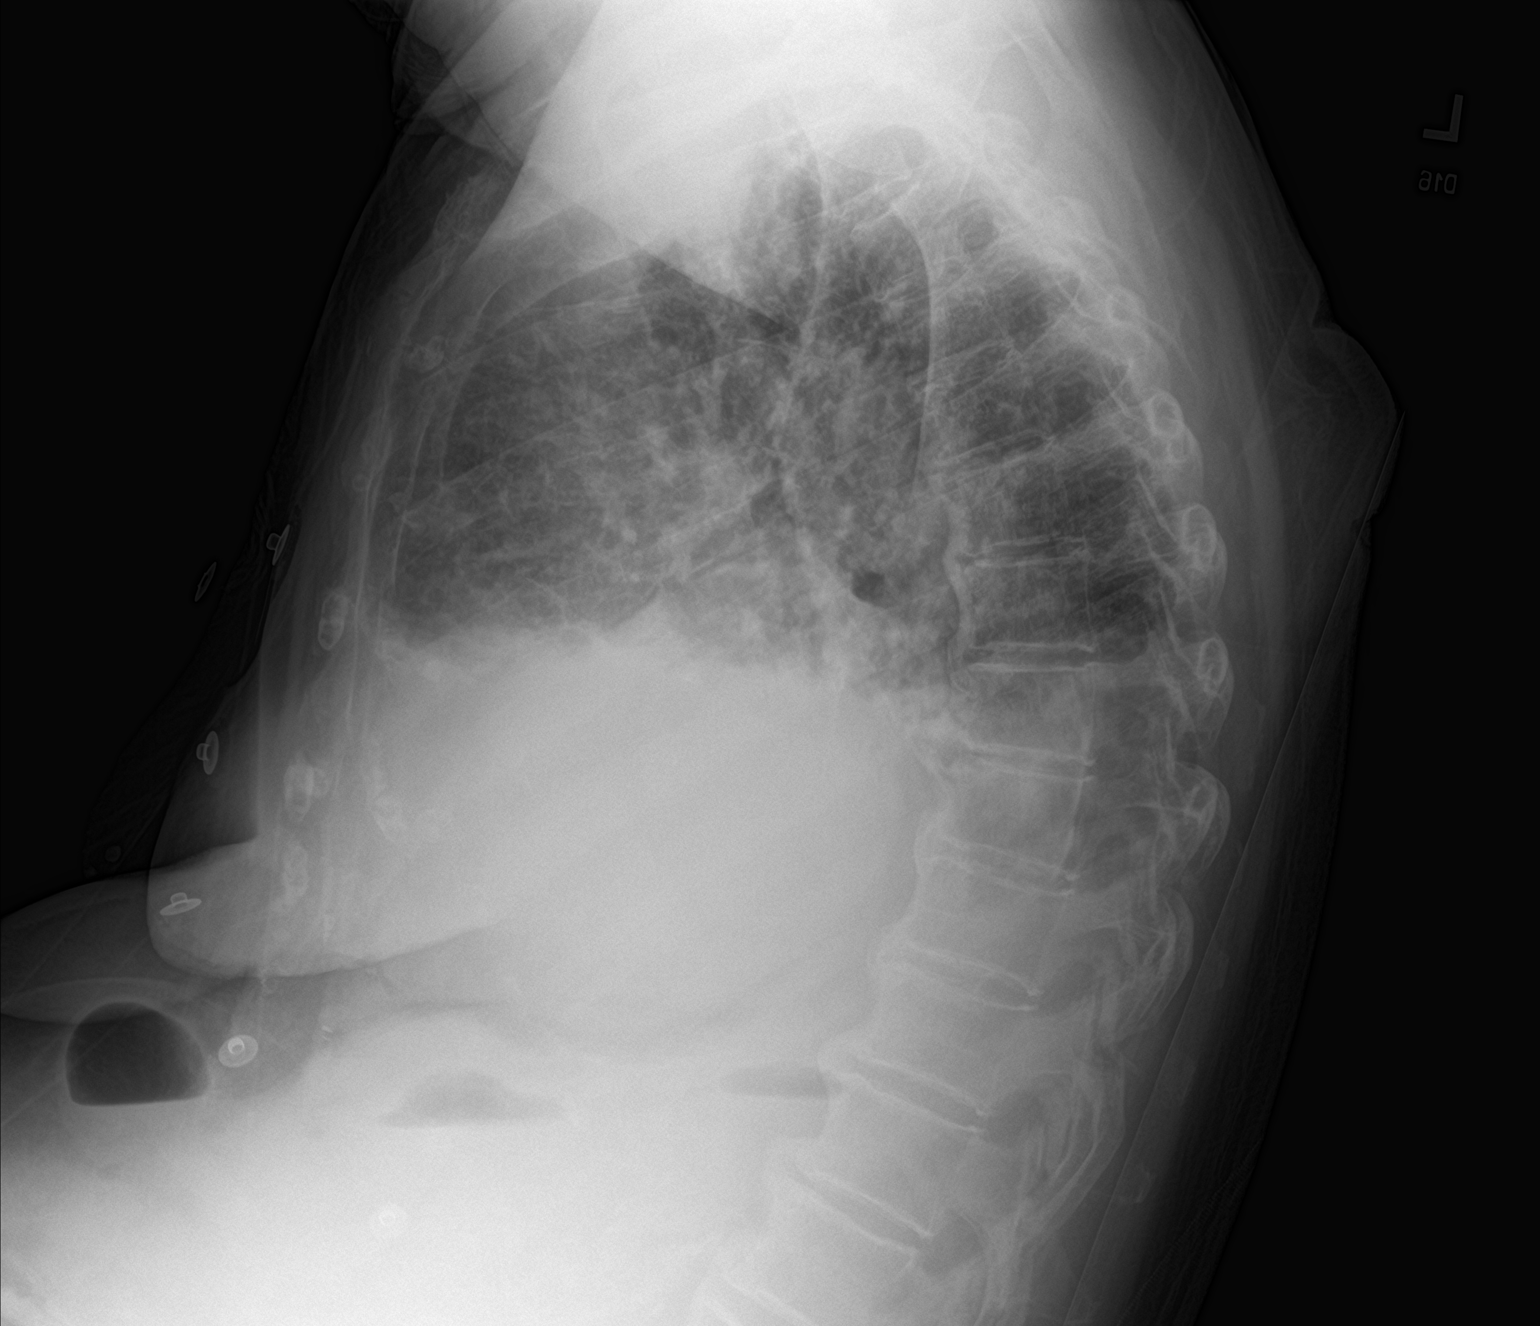

[chest ap]
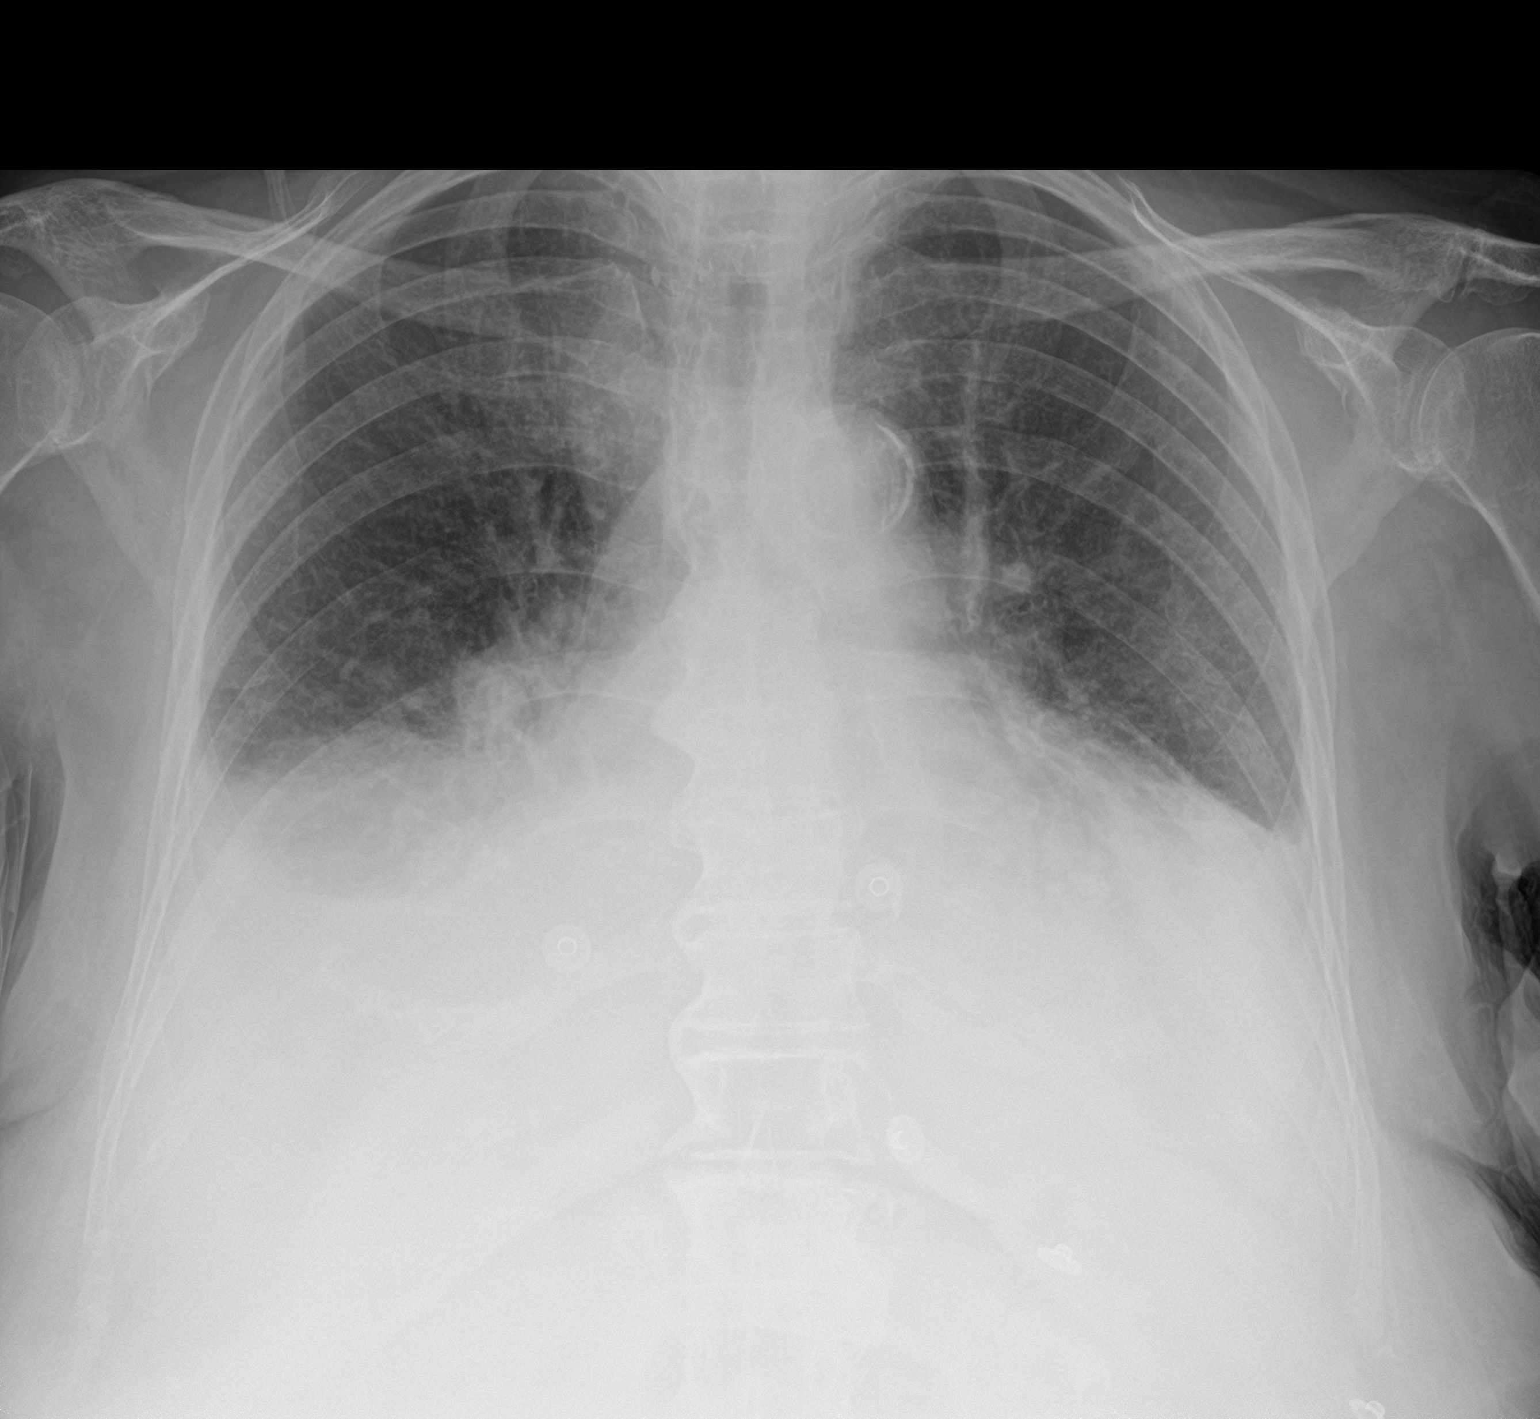

[2 of 2 positions shown; findings below may reference images not displayed]

FINDINGS: Heart is enlarged, unchanged. The upper mediastinal contours are
stable.

There are bilateral pleural effusions with adjacent bibasilar
airspace opacities, overall slightly worsened since 03/19/2022.
There is vascular congestion without definite overt pulmonary edema.
There is no pneumothorax

There is no acute osseous abnormality.
IMPRESSION: Bilateral pleural effusions with adjacent bibasilar airspace
opacities, worsened since 03/19/2022.

## 2024-01-10 IMAGING — CT CT CHEST W/O CM
2 of 3 series · 15 of 36 positions shown, 18 images · non-contrast
Comparison: 03/19/2022.

CLINICAL DATA: Pneumonia.  Possible pleural effusion.



[Series 5: chest w/o 2mm st · axial · non-contrast · 0.74mm/px · z∈[+1278,+1506]mm · 12 of 134 slices shown, 15 images]
[im 10/134  mediastinal]
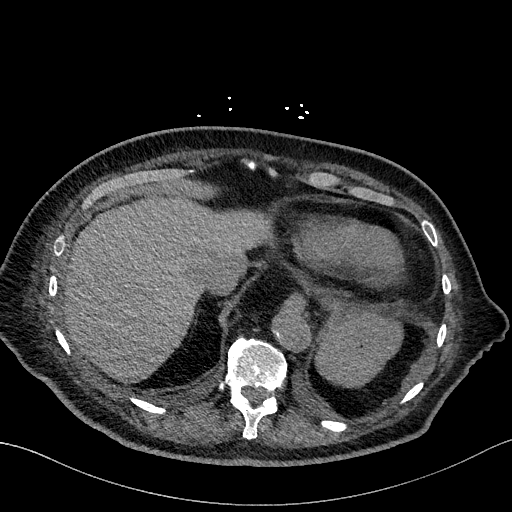
[im 10/134  lung]
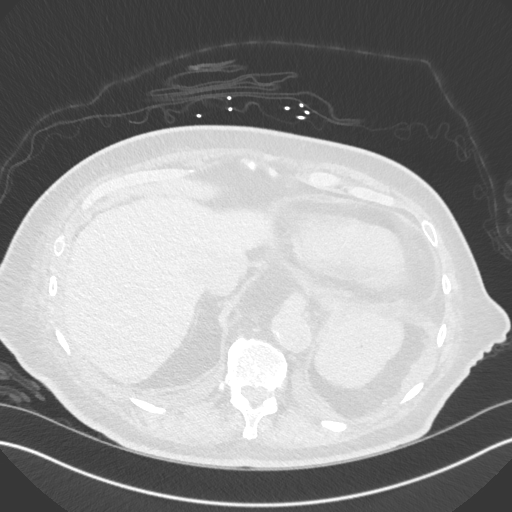
[im 20/134  lung]
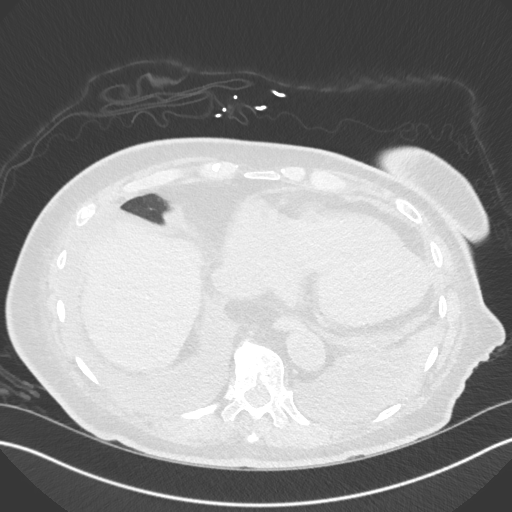
[im 30/134  lung]
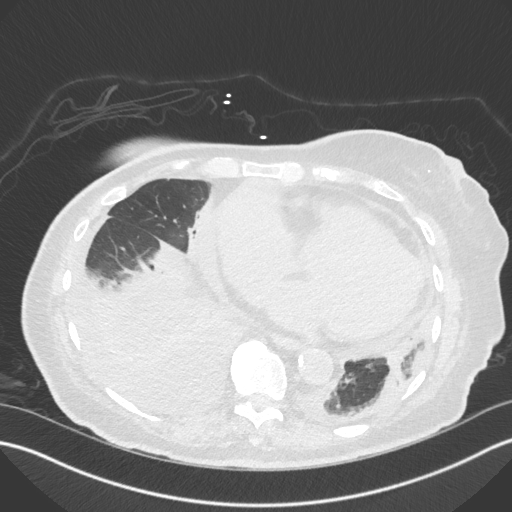
[im 40/134  lung]
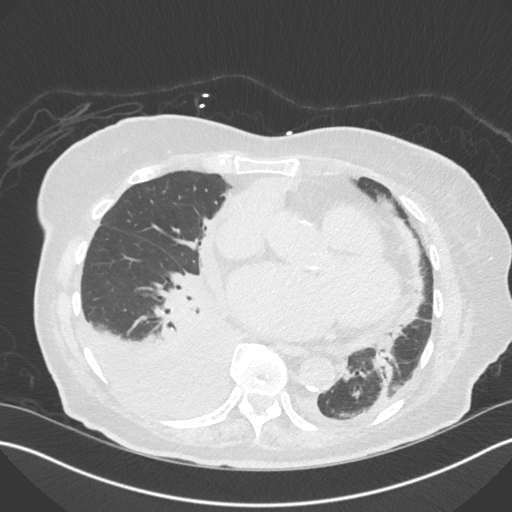
[im 50/134  mediastinal]
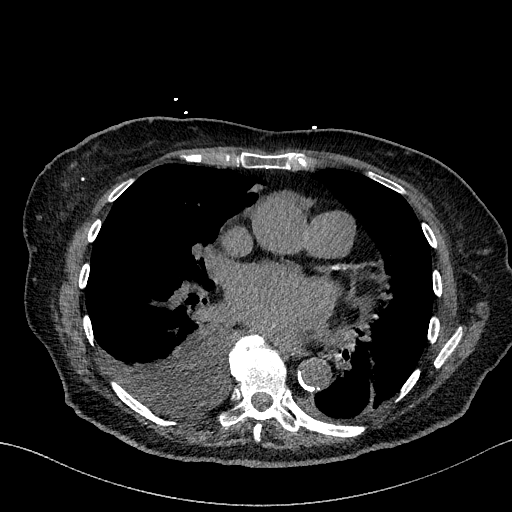
[im 50/134  lung]
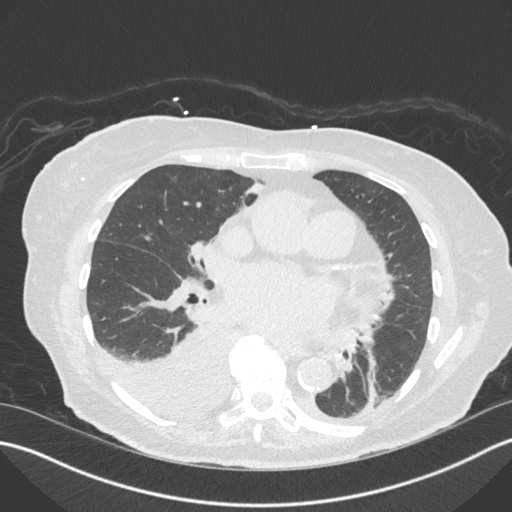
[im 60/134  lung]
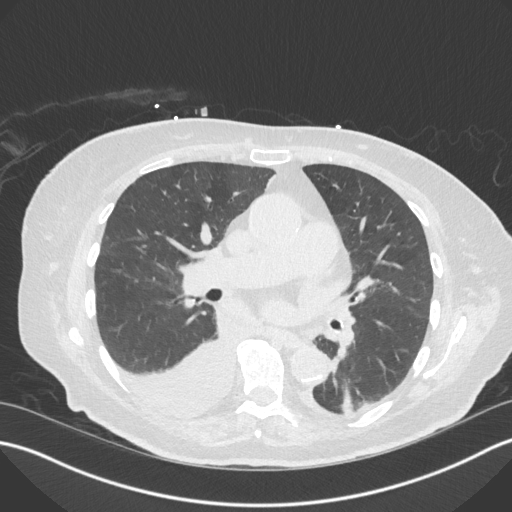
[im 74/134  lung]
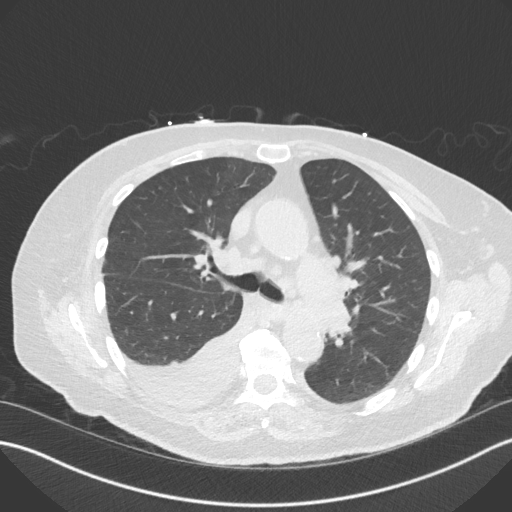
[im 84/134  lung]
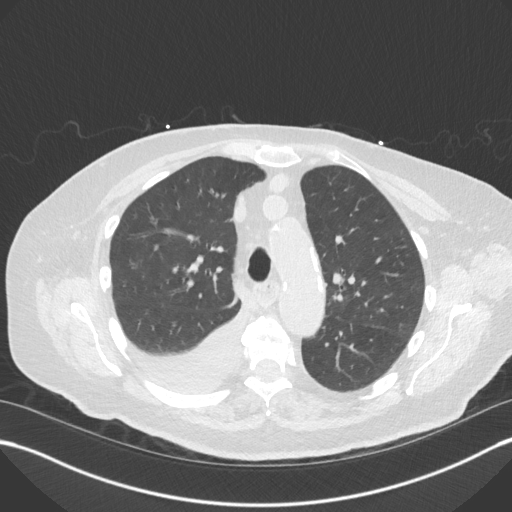
[im 94/134  mediastinal]
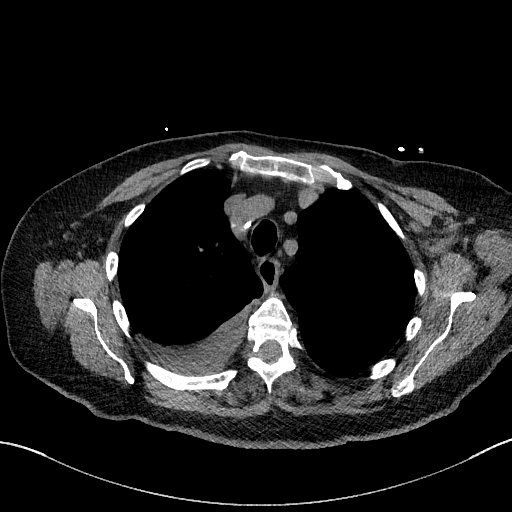
[im 94/134  lung]
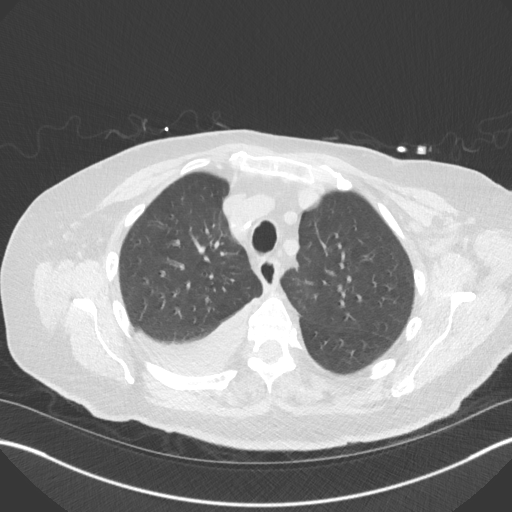
[im 104/134  lung]
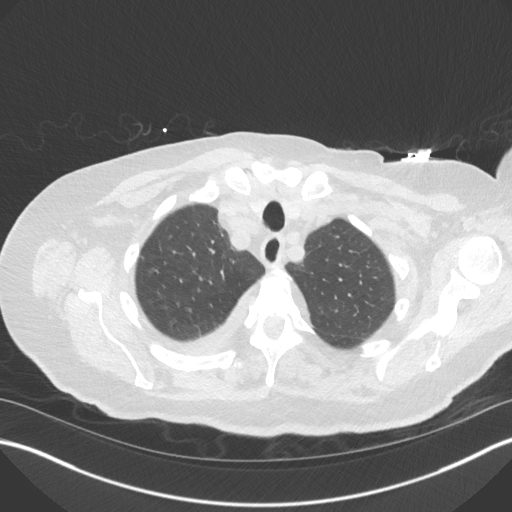
[im 114/134  lung]
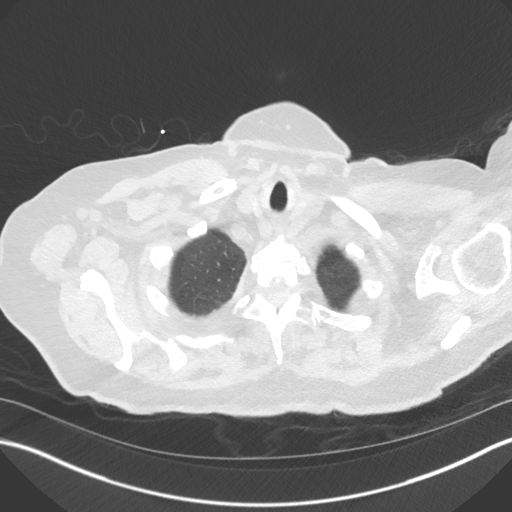
[im 124/134  lung]
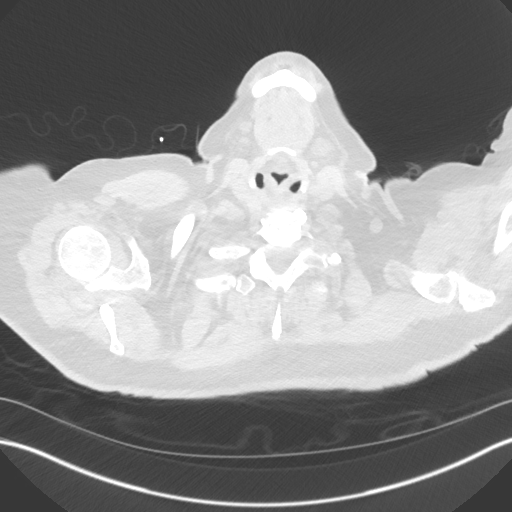

[Series 6: chest w/o 2mm st cor · coronal · non-contrast · 0.59mm/px · 3 of 151 slices shown]
[im 31/151  lung]
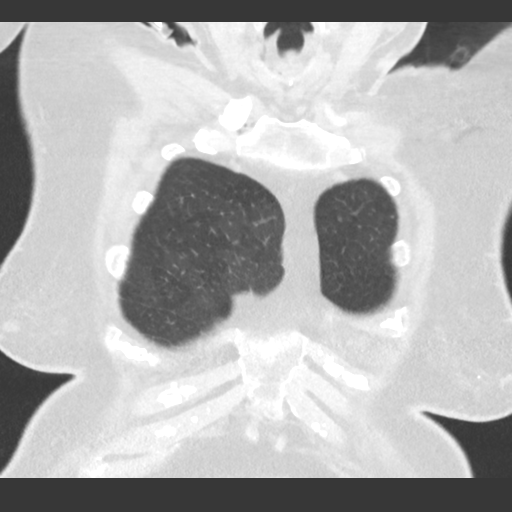
[im 61/151  lung]
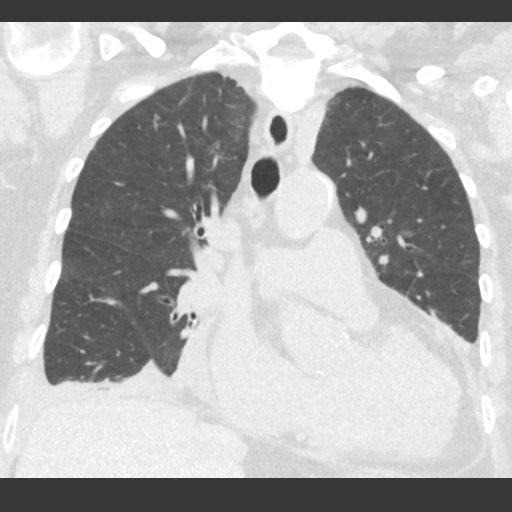
[im 91/151  lung]
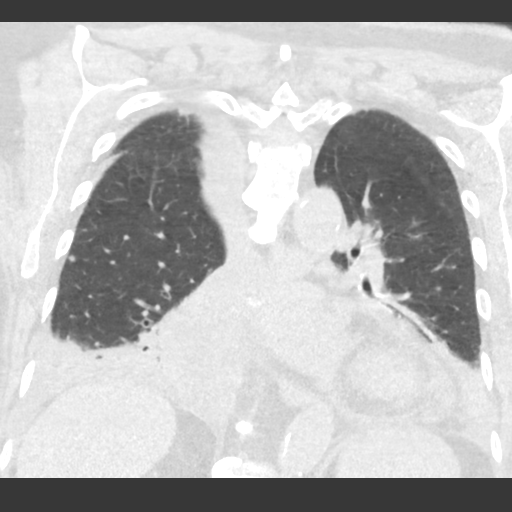

[15 of 36 positions shown; findings below may reference images not displayed]

FINDINGS: Cardiovascular: Heart is mildly enlarged with a left atrial
predominance. Small pericardial effusion. Three-vessel coronary
artery calcifications. Great vessels are normal in caliber. Stable
aortic atherosclerotic calcifications.

Mediastinum/Nodes: Prominent to mildly enlarged mediastinal lymph
nodes, largest a right subcarinal node measuring 1.8 cm in short
axis, without change. No mediastinal or hilar masses. Trachea and
esophagus are unremarkable.

Lungs/Pleura: Moderate right and trace left pleural effusions.

Lung consolidation and/or atelectasis, right lower lobe tracking
along the segmental bronchi, and to a lesser degree in the left
lower lobe and medial aspects of the left upper lobe lingula and
right middle lobe, similar to the recent prior CT. Remainder of the
lungs is clear. No evidence of pulmonary edema. No pneumothorax.

Upper Abdomen: No acute abnormality.

Musculoskeletal: No fracture or acute finding. No bone lesion. No
chest wall mass.
IMPRESSION: 1. No significant change from the recent prior chest CT.
2. Cardiomegaly, small pericardial effusion, moderate right and
trace left pleural effusions. Dependent lung opacity, most
prominently in the right lower lobe, likely atelectasis. Pneumonia
is possible. No evidence of pulmonary edema. Mildly enlarged right
subcarinal lymph node, presumed reactive.
3. Coronary artery calcifications and aortic atherosclerosis.

Aortic Atherosclerosis (PCAST-OB3.3).

## 2024-01-12 IMAGING — US IR CHEST US
1 series · 2 of 2 positions shown · non-contrast
Comparison: None Available.

CLINICAL DATA: Pleural effusion

EXAM:
CHEST ULTRASOUND

[Series 1: ir (person_name)/(person_name) · 2 of 2 slices shown]
[im 1/2]
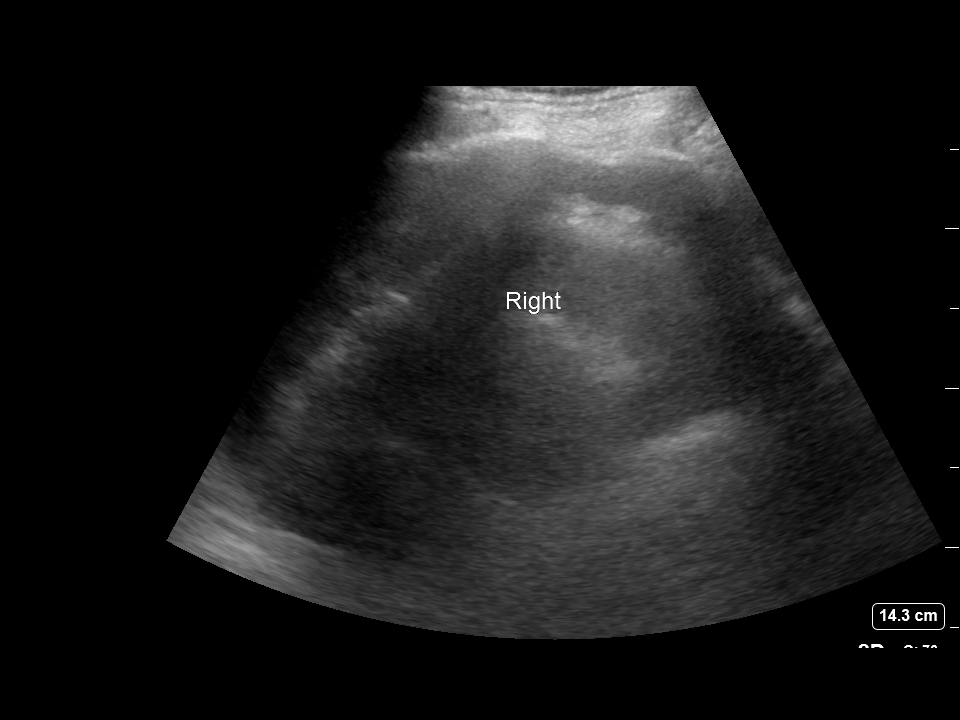
[im 2/2]
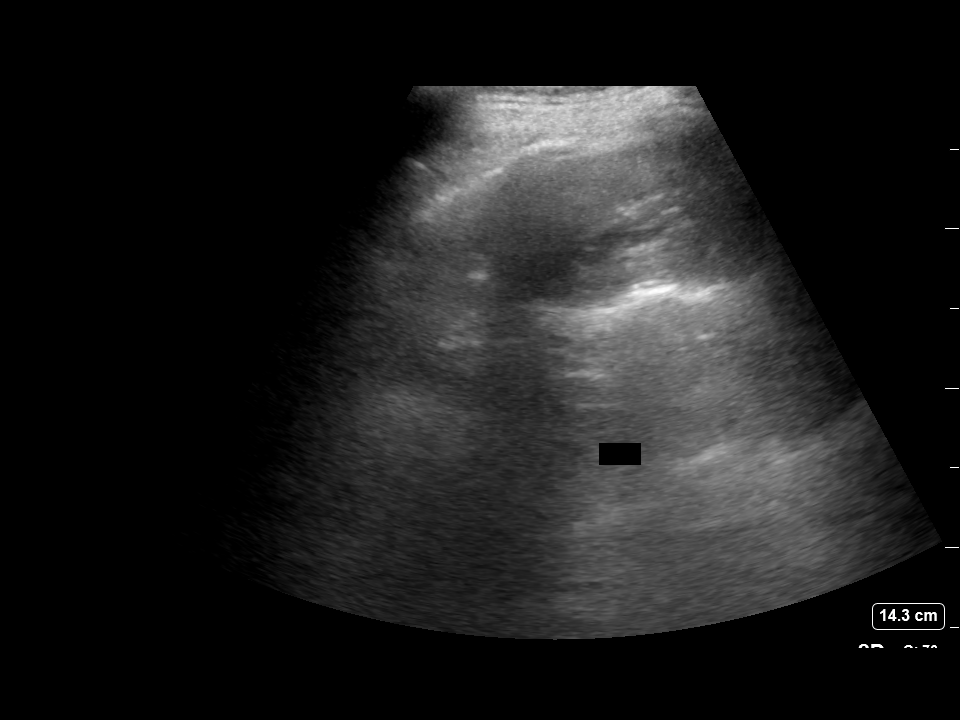

[2 of 2 positions shown; findings below may reference images not displayed]

FINDINGS: Focused sonographic exam of the bilateral chest demonstrates
bilateral small volume pleural effusions, insufficient for safe
image guided thoracentesis. No intervention performed.
IMPRESSION: Small bilateral pleural effusions.  No thoracentesis performed.

## 2024-01-25 ENCOUNTER — Encounter (HOSPITAL_BASED_OUTPATIENT_CLINIC_OR_DEPARTMENT_OTHER): Payer: Self-pay | Admitting: Emergency Medicine

## 2024-01-25 ENCOUNTER — Observation Stay (HOSPITAL_BASED_OUTPATIENT_CLINIC_OR_DEPARTMENT_OTHER)
Admission: EM | Admit: 2024-01-25 | Discharge: 2024-01-28 | Disposition: A | Discharge status: Home / Self Care | Attending: Internal Medicine | Admitting: Internal Medicine

## 2024-01-25 ENCOUNTER — Other Ambulatory Visit: Payer: Self-pay

## 2024-01-25 ENCOUNTER — Emergency Department (HOSPITAL_BASED_OUTPATIENT_CLINIC_OR_DEPARTMENT_OTHER)

## 2024-01-25 DIAGNOSIS — J189 Pneumonia, unspecified organism: Secondary | ICD-10-CM | POA: Insufficient documentation

## 2024-01-25 DIAGNOSIS — J9601 Acute respiratory failure with hypoxia: Principal | ICD-10-CM | POA: Insufficient documentation

## 2024-01-25 DIAGNOSIS — M109 Gout, unspecified: Secondary | ICD-10-CM | POA: Diagnosis not present

## 2024-01-25 DIAGNOSIS — N390 Urinary tract infection, site not specified: Secondary | ICD-10-CM | POA: Insufficient documentation

## 2024-01-25 DIAGNOSIS — E1100 Type 2 diabetes mellitus with hyperosmolarity without nonketotic hyperglycemic-hyperosmolar coma (NKHHC): Secondary | ICD-10-CM

## 2024-01-25 DIAGNOSIS — R0902 Hypoxemia: Secondary | ICD-10-CM | POA: Diagnosis present

## 2024-01-25 DIAGNOSIS — N10 Acute tubulo-interstitial nephritis: Secondary | ICD-10-CM | POA: Insufficient documentation

## 2024-01-25 DIAGNOSIS — R531 Weakness: Secondary | ICD-10-CM | POA: Diagnosis present

## 2024-01-25 DIAGNOSIS — G250 Essential tremor: Secondary | ICD-10-CM | POA: Diagnosis not present

## 2024-01-25 DIAGNOSIS — Z1152 Encounter for screening for COVID-19: Secondary | ICD-10-CM | POA: Diagnosis not present

## 2024-01-25 DIAGNOSIS — Z794 Long term (current) use of insulin: Secondary | ICD-10-CM | POA: Diagnosis not present

## 2024-01-25 DIAGNOSIS — N12 Tubulo-interstitial nephritis, not specified as acute or chronic: Principal | ICD-10-CM

## 2024-01-25 DIAGNOSIS — E1142 Type 2 diabetes mellitus with diabetic polyneuropathy: Secondary | ICD-10-CM | POA: Diagnosis not present

## 2024-01-25 DIAGNOSIS — I5032 Chronic diastolic (congestive) heart failure: Secondary | ICD-10-CM | POA: Insufficient documentation

## 2024-01-25 DIAGNOSIS — I110 Hypertensive heart disease with heart failure: Secondary | ICD-10-CM | POA: Diagnosis not present

## 2024-01-25 DIAGNOSIS — Z8719 Personal history of other diseases of the digestive system: Secondary | ICD-10-CM | POA: Insufficient documentation

## 2024-01-25 DIAGNOSIS — J90 Pleural effusion, not elsewhere classified: Secondary | ICD-10-CM | POA: Insufficient documentation

## 2024-01-25 DIAGNOSIS — Z79899 Other long term (current) drug therapy: Secondary | ICD-10-CM | POA: Insufficient documentation

## 2024-01-25 DIAGNOSIS — I4891 Unspecified atrial fibrillation: Secondary | ICD-10-CM | POA: Diagnosis not present

## 2024-01-25 HISTORY — DX: Pneumonia, unspecified organism: J18.9

## 2024-01-25 LAB — BASIC METABOLIC PANEL
Anion gap: 9 (ref 5–15)
BUN: 12 mg/dL (ref 8–23)
CO2: 27 mmol/L (ref 22–32)
Calcium: 8.9 mg/dL (ref 8.9–10.3)
Chloride: 102 mmol/L (ref 98–111)
Creatinine, Ser: 0.98 mg/dL (ref 0.44–1.00)
GFR, Estimated: 56 mL/min — ABNORMAL LOW (ref >60)
Glucose, Bld: 101 mg/dL — ABNORMAL HIGH (ref 70–99)
Potassium: 4.3 mmol/L (ref 3.5–5.1)
Sodium: 138 mmol/L (ref 135–145)

## 2024-01-25 LAB — URINALYSIS, MICROSCOPIC (REFLEX): RBC / HPF: NONE SEEN RBC/hpf (ref 0–5)

## 2024-01-25 LAB — CBC WITH DIFFERENTIAL/PLATELET
Abs Immature Granulocytes: 0.03 10*3/uL (ref 0.00–0.07)
Basophils Absolute: 0.1 10*3/uL (ref 0.0–0.1)
Basophils Relative: 1 %
Eosinophils Absolute: 0.1 10*3/uL (ref 0.0–0.5)
Eosinophils Relative: 2 %
HCT: 40.6 % (ref 36.0–46.0)
Hemoglobin: 13 g/dL (ref 12.0–15.0)
Immature Granulocytes: 1 %
Lymphocytes Relative: 27 %
Lymphs Abs: 1.8 10*3/uL (ref 0.7–4.0)
MCH: 30 pg (ref 26.0–34.0)
MCHC: 32 g/dL (ref 30.0–36.0)
MCV: 93.5 fL (ref 80.0–100.0)
Monocytes Absolute: 0.7 10*3/uL (ref 0.1–1.0)
Monocytes Relative: 11 %
Neutro Abs: 3.9 10*3/uL (ref 1.7–7.7)
Neutrophils Relative %: 58 %
Platelets: 199 10*3/uL (ref 150–400)
RBC: 4.34 MIL/uL (ref 3.87–5.11)
RDW: 15.3 % (ref 11.5–15.5)
WBC: 6.6 10*3/uL (ref 4.0–10.5)
nRBC: 0 % (ref 0.0–0.2)

## 2024-01-25 LAB — URINALYSIS, ROUTINE W REFLEX MICROSCOPIC
Bilirubin Urine: NEGATIVE
Glucose, UA: >=500 mg/dL — AB
Hgb urine dipstick: NEGATIVE
Ketones, ur: NEGATIVE mg/dL
Nitrite: POSITIVE — AB
Protein, ur: 30 mg/dL — AB
Specific Gravity, Urine: 1.015 (ref 1.005–1.030)
pH: 6 (ref 5.0–8.0)

## 2024-01-25 LAB — RESP PANEL BY RT-PCR (RSV, FLU A&B, COVID)  RVPGX2
Influenza A by PCR: NEGATIVE
Influenza B by PCR: NEGATIVE
Resp Syncytial Virus by PCR: NEGATIVE
SARS Coronavirus 2 by RT PCR: NEGATIVE

## 2024-01-25 LAB — BRAIN NATRIURETIC PEPTIDE: B Natriuretic Peptide: 283 pg/mL — ABNORMAL HIGH (ref 0.0–100.0)

## 2024-01-25 LAB — LACTIC ACID, PLASMA: Lactic Acid, Venous: 1.4 mmol/L (ref 0.5–1.9)

## 2024-01-25 MED ORDER — SODIUM CHLORIDE 0.9 % IV SOLN
500.0000 mg | Freq: Once | INTRAVENOUS | Status: AC
  Administered 2024-01-25: 500 mg via INTRAVENOUS
  Filled 2024-01-25: qty 5

## 2024-01-25 MED ORDER — ACETAMINOPHEN 325 MG PO TABS
650.0000 mg | ORAL_TABLET | Freq: Once | ORAL | Status: AC
  Administered 2024-01-25: 650 mg via ORAL
  Filled 2024-01-25: qty 2

## 2024-01-25 MED ORDER — SODIUM CHLORIDE 0.9 % IV SOLN
1.0000 g | Freq: Once | INTRAVENOUS | Status: AC
  Administered 2024-01-25: 1 g via INTRAVENOUS
  Filled 2024-01-25: qty 10

## 2024-01-25 NOTE — ED Notes (Signed)
 [facility] called for transport.

## 2024-01-25 NOTE — ED Provider Notes (Signed)
 [place] [facility] DEPARTMENT AT [facility] Provider Note   CSN: [id number] Arrival date & time: 01/25/24  1415     History  Chief Complaint  Patient presents with   Weakness    Anne Roach is a 89 y.o. female.  The history is provided by the patient, a relative and medical records. No language interpreter was used.  Weakness Severity:  Moderate Onset quality:  Gradual Duration:  1 day Timing:  Constant Progression:  Worsening Chronicity:  New Relieved by:  Nothing Worsened by:  Nothing Ineffective treatments:  None tried Associated symptoms: fever (subjecteive and chills), nausea, shortness of breath and vomiting   Associated symptoms: no abdominal pain, no aphasia, no chest pain, no cough, no diarrhea, no dizziness, no dysuria, no numbness in extremities, no foul-smelling urine, no frequency, no headaches and no stroke symptoms        Home Medications Prior to Admission medications   Medication Sig Start Date End Date Taking? Authorizing Provider  ACCU-CHEK AVIVA PLUS test strip  10/06/20   [provider]  acetaminophen (TYLENOL) 500 MG tablet Take 1,000 mg by mouth every 6 (six) hours as needed for mild pain or headache.    [provider]  allopurinol (ZYLOPRIM) 100 MG tablet TAKE 1 TABLET(100 MG) BY MOUTH DAILY Patient taking differently: Take 100 mg by mouth daily. 08/09/21   Felecia Shelling, DPM  Cholecalciferol (VITAMIN D) 125 MCG (5000 UT) CAPS Take 5,000 Units by mouth daily with breakfast.    [provider]  diclofenac Sodium (VOLTAREN) 1 % GEL Apply 4 g topically 4 (four) times daily. 11/29/23   Melene Plan, DO  ELIQUIS 5 MG TABS tablet TAKE 1 TABLET(5 MG) BY MOUTH TWICE DAILY Patient taking differently: Take 5 mg by mouth 2 (two) times daily. 08/24/23   Wendall Stade, MD  empagliflozin (JARDIANCE) 10 MG TABS tablet TAKE 1 TABLET(10 MG) BY MOUTH DAILY 12/24/23   Wendall Stade, MD  folic acid (FOLVITE) 800 MCG  tablet Take 800 mcg by mouth daily.    [provider]  furosemide (LASIX) 20 MG tablet Take 1 tablet (20 mg total) by mouth daily. 11/25/23   Gaston Islam., NP  gabapentin (NEURONTIN) 400 MG capsule Take 400 mg by mouth 3 (three) times daily. 03/25/22   [provider]  Iron-FA-B Cmp-C-Biot-Probiotic (FUSION PLUS) CAPS Take 1 capsule by mouth every other day. 08/26/23   [provider]  loratadine (CLARITIN) 10 MG tablet Take 10 mg by mouth daily as needed for allergies. 08/28/23   [provider]  metoprolol succinate (TOPROL XL) 50 MG 24 hr tablet Take 1 tablet (50 mg total) by mouth daily. Take with or immediately following a meal. 11/25/23   Gaston Islam., NP  metoprolol tartrate (LOPRESSOR) 25 MG tablet Take 1 tablet (25 mg total) by mouth daily as needed (palpitations). Patient not taking: Reported on 12/05/2023 11/25/23   Gaston Islam., NP  Multiple Vitamins-Minerals (ICAPS AREDS 2 PO) Take 1 capsule by mouth 2 (two) times daily.    [provider]  Potassium Chloride ER 20 MEQ TBCR Take 20 mEq by mouth daily. 04/09/22   Tonye Becket D, NP      Allergies    Patient has no known allergies.    Review of Systems   Review of Systems  Constitutional:  Positive for fever (subjecteive and chills). Negative for chills and fatigue.  HENT:  Negative for congestion.  Respiratory:  Positive for shortness of breath. Negative for cough and chest tightness.   Cardiovascular:  Negative for chest pain and palpitations.  Gastrointestinal:  Positive for nausea and vomiting. Negative for abdominal pain and diarrhea.  Genitourinary:  Negative for dysuria, flank pain and frequency.  Musculoskeletal:  Positive for back pain. Negative for neck pain and neck stiffness.  Skin:  Negative for rash.  Neurological:  Positive for weakness. Negative for dizziness, light-headedness, numbness and headaches.  All other systems reviewed and are negative.   Physical  Exam Updated Vital Signs BP (!) 149/91   Pulse (!) 111   Temp 98 F (36.7 C) (Oral)   Resp 20   Ht 5\' 5"  (1.651 m)   Wt 68 kg   SpO2 91%   BMI 24.96 kg/m  Physical Exam Vitals and nursing note reviewed.  Constitutional:      General: She is not in acute distress.    Appearance: She is well-developed. She is not ill-appearing, toxic-appearing or diaphoretic.  HENT:     Head: Normocephalic and atraumatic.     Nose: No congestion or rhinorrhea.     Mouth/Throat:     Mouth: Mucous membranes are dry.  Eyes:     Extraocular Movements: Extraocular movements intact.     Conjunctiva/sclera: Conjunctivae normal.     Pupils: Pupils are equal, round, and reactive to light.  Cardiovascular:     Rate and Rhythm: Normal rate and regular rhythm.     Pulses: Normal pulses.     Heart sounds: Murmur heard.  Pulmonary:     Effort: Pulmonary effort is normal. No respiratory distress.     Breath sounds: Rhonchi and rales present. No wheezing.  Chest:     Chest wall: No tenderness.  Abdominal:     General: Abdomen is flat.     Palpations: Abdomen is soft.     Tenderness: There is no abdominal tenderness. There is no right CVA tenderness, left CVA tenderness, guarding or rebound.  Musculoskeletal:        General: No swelling or tenderness.     Cervical back: Neck supple. No tenderness.     Right lower leg: No edema.     Left lower leg: No edema.  Skin:    General: Skin is warm and dry.     Capillary Refill: Capillary refill takes less than 2 seconds.     Findings: No erythema.  Neurological:     General: No focal deficit present.     Mental Status: She is alert.  Psychiatric:        Mood and Affect: Mood normal.     ED Results / Procedures / Treatments   Labs (all labs ordered are listed, but only abnormal results are displayed) Labs Reviewed  BASIC METABOLIC PANEL - Abnormal; Notable for the following components:      Result Value   Glucose, Bld 101 (*)    GFR, Estimated 56  (*)    All other components within normal limits  URINALYSIS, ROUTINE W REFLEX MICROSCOPIC - Abnormal; Notable for the following components:   APPearance HAZY (*)    Glucose, UA >=500 (*)    Protein, ur 30 (*)    Nitrite POSITIVE (*)    Leukocytes,Ua SMALL (*)    All other components within normal limits  URINALYSIS, MICROSCOPIC (REFLEX) - Abnormal; Notable for the following components:   Bacteria, UA MANY (*)    All other components within normal limits  BRAIN NATRIURETIC PEPTIDE -  Abnormal; Notable for the following components:   B Natriuretic Peptide 283.0 (*)    All other components within normal limits  RESP PANEL BY RT-PCR (RSV, FLU A&B, COVID)  RVPGX2  CULTURE, BLOOD (ROUTINE X 2)  CULTURE, BLOOD (ROUTINE X 2)  CBC WITH DIFFERENTIAL/PLATELET  LACTIC ACID, PLASMA  TSH    EKG EKG Interpretation Date/Time:  Monday January 25 2024 15:04:04 EDT Ventricular Rate:  97 PR Interval:    QRS Duration:  94 QT Interval:  368 QTC Calculation: 468 R Axis:   -21  Text Interpretation: Atrial fibrillation Borderline left axis deviation Low voltage, precordial leads Consider anterior infarct when compared to prior, similar afib with slower rate No STEMI Confirmed by Theda Belfast ([id number]) on 01/25/2024 3:55:12 PM  Radiology DG Chest 2 View Result Date: 01/25/2024 CLINICAL DATA:  Generalized weakness EXAM: CHEST - 2 VIEW COMPARISON:  Chest x-ray 12/04/2023 FINDINGS: There is a large left pleural effusion, unchanged. Minimal patchy opacities are seen in the right infrahilar region, unchanged. Cardiomediastinal silhouette is stable. There is no pneumothorax or acute fracture. IMPRESSION: 1. Stable large left pleural effusion. 2. Stable minimal patchy opacities in the right infrahilar region. Electronically Signed   By: Darliss Cheney M.D.   On: 01/25/2024 18:47    Procedures Procedures    CRITICAL CARE Performed by: Canary Brim Keir Viernes Total critical care time: 35 minutes Critical care  time was exclusive of separately billable procedures and treating other patients. Critical care was necessary to treat or prevent imminent or life-threatening deterioration. Critical care was time spent personally by me on the following activities: development of treatment plan with patient and/or surrogate as well as nursing, discussions with consultants, evaluation of patient's response to treatment, examination of patient, obtaining history from patient or surrogate, ordering and performing treatments and interventions, ordering and review of laboratory studies, ordering and review of radiographic studies, pulse oximetry and re-evaluation of patient's condition.  Medications Ordered in ED Medications  azithromycin (ZITHROMAX) 500 mg in sodium chloride 0.9 % 250 mL IVPB (500 mg Intravenous New Bag/Given 01/25/24 2025)  cefTRIAXone (ROCEPHIN) 1 g in sodium chloride 0.9 % 100 mL IVPB (0 g Intravenous Stopped 01/25/24 2046)    ED Course/ Medical Decision Making/ A&P                                 Medical Decision Making Amount and/or Complexity of Data Reviewed Labs: ordered. Radiology: ordered.  Risk Decision regarding hospitalization.    Anne Roach is a 89 y.o. female with a past medical history significant for hypertension, diabetes, CKD, atrial fibrillation on Eliquis therapy, previous colon cancer status post surgery, CHF, diabetes, previous esophageal dilation, previous appendectomy, and GERD who presents with worsening fatigue, exposure to her son with pneumonia, shortness of breath, and malaise.  According to patient, her caregiver recently's been admitted with pneumonia and patient has had more more fatigue.  She has not had focal abdominal pain but she does have some nausea and vomiting.  She reports she has been developing flank and back pain today and has been having some fatigue and chills.  She is reporting some shortness of breath and her oxygen saturations were 85% when I  examined her initially.  She is denying significant cough but she has had large pleural effusion that had to be drained in the past with thoracentesis at [facility].  Otherwise she is denying headache or neck pain.  She is denying any focal arm or leg pain.  She reports chronic edema in her legs that seems similar.  Denies constipation or diarrhea.  On exam, lungs had coarse breath sounds in the right and then were very diminished on the left with rales.  Chest was nontender.  Abdomen nontender.  She did have good bowel sounds.  Her CVA areas were tender bilaterally but midline was not tender on her back.  Legs had edema but had intact pulses.  Patient was tachycardic, tachypneic, and warm to the touch.  Oxygen saturations were in the 80s on room air.  She does not take oxygen at home normally.  Patient had some workup starting in triage that she has been here for 5 hours already.   Her workup began to return and she does have evidence of UTI with nitrites, leukocytes, and bacteria.  This is a change from her previous urine several months ago.  Given the nausea, vomiting, chills, and now CVA pain I am concerned about UTI that is developing in the pyelonephritis.  I suspect this is causing her to have more fatigue.  She was negative for COVID/flu/RSV and her CBC and BMP were overall reassuring.  That being said, her chest x-ray showed a large pleural effusion on the left that seems similar and some opacities on the right side.  Given her hypoxia now, I am concerned about symptomatic pleural effusion versus early pneumonia given the exposure and her exam.  Will give Rocephin and azithromycin to cover both UTI and pneumonia and will check other labs.  Will get lactic acid and blood cultures.  Will keep her on oxygen now.  She will need admission for hypoxia, likely needs thoracentesis/fluid management, and will get antibiotics for the UTI/pyelonephritis.  Patient request going to [facility] health  as this is where they did her thoracentesis and she has been taking care of in the past.  We discussed that they may be full and she may need to be admitted in the [facility] system but we will try with [facility] first.  Patient and family agree with this plan, she will be admitted.  8:47 PM Patient initially wanted to go to [facility].  I spoke to [facility] and they agreed with admission but they said it would be many hours on the wait list.  Patient now says she would prefer to go into the [facility] system so we will call for admission to [facility] as she may need thoracentesis which might be easier at [facility] is compared to [place] [facility].  Will call for medicine admission for UTI, pneumonia, and hypoxia in the setting of large pleural effusion.         Final Clinical Impression(s) / ED Diagnoses Final diagnoses:  Pyelonephritis  Pneumonia due to infectious organism, unspecified laterality, unspecified part of lung  Pleural effusion on left  Hypoxia     Clinical Impression: 1. Pyelonephritis   2. Pneumonia due to infectious organism, unspecified laterality, unspecified part of lung   3. Pleural effusion on left   4. Hypoxia     Disposition: Admit  This note was prepared with assistance of Dragon voice recognition software. Occasional wrong-word or sound-a-like substitutions may have occurred due to the inherent limitations of voice recognition software.      Anatole Apollo, Canary Brim, MD 01/25/24 2123

## 2024-01-25 NOTE — ED Notes (Addendum)
 Unable to collect 2nd anaerobic BC after multiple phlebotomy attempts.  Antibiotics started as to not delay care. EDP Tegeler made aware of difficulty with blood collection and inability to collect TSH at this time.

## 2024-01-25 NOTE — ED Notes (Signed)
 [facility] at bedside

## 2024-01-25 NOTE — ED Notes (Signed)
 Pt ambulatory with rolling walker, one person assistance.

## 2024-01-25 NOTE — ED Triage Notes (Signed)
 Generalized weakness onset today. Reports her caretaker has pneumonia. Denies other sx.

## 2024-01-26 DIAGNOSIS — J9601 Acute respiratory failure with hypoxia: Secondary | ICD-10-CM | POA: Diagnosis not present

## 2024-01-26 LAB — COMPREHENSIVE METABOLIC PANEL
ALT: 9 U/L (ref 0–44)
AST: 16 U/L (ref 15–41)
Albumin: 3.2 g/dL — ABNORMAL LOW (ref 3.5–5.0)
Alkaline Phosphatase: 40 U/L (ref 38–126)
Anion gap: 9 (ref 5–15)
BUN: 13 mg/dL (ref 8–23)
CO2: 26 mmol/L (ref 22–32)
Calcium: 8.9 mg/dL (ref 8.9–10.3)
Chloride: 103 mmol/L (ref 98–111)
Creatinine, Ser: 0.93 mg/dL (ref 0.44–1.00)
GFR, Estimated: 59 mL/min — ABNORMAL LOW (ref >60)
Glucose, Bld: 97 mg/dL (ref 70–99)
Potassium: 3.9 mmol/L (ref 3.5–5.1)
Sodium: 138 mmol/L (ref 135–145)
Total Bilirubin: 0.6 mg/dL (ref 0.0–1.2)
Total Protein: 6.7 g/dL (ref 6.5–8.1)

## 2024-01-26 LAB — CBC WITH DIFFERENTIAL/PLATELET
Abs Immature Granulocytes: 0.03 10*3/uL (ref 0.00–0.07)
Basophils Absolute: 0.1 10*3/uL (ref 0.0–0.1)
Basophils Relative: 1 %
Eosinophils Absolute: 0.2 10*3/uL (ref 0.0–0.5)
Eosinophils Relative: 2 %
HCT: 42.7 % (ref 36.0–46.0)
Hemoglobin: 12.8 g/dL (ref 12.0–15.0)
Immature Granulocytes: 1 %
Lymphocytes Relative: 36 %
Lymphs Abs: 2.4 10*3/uL (ref 0.7–4.0)
MCH: 29.5 pg (ref 26.0–34.0)
MCHC: 30 g/dL (ref 30.0–36.0)
MCV: 98.4 fL (ref 80.0–100.0)
Monocytes Absolute: 0.8 10*3/uL (ref 0.1–1.0)
Monocytes Relative: 13 %
Neutro Abs: 3.1 10*3/uL (ref 1.7–7.7)
Neutrophils Relative %: 47 %
Platelets: 203 10*3/uL (ref 150–400)
RBC: 4.34 MIL/uL (ref 3.87–5.11)
RDW: 15.3 % (ref 11.5–15.5)
WBC: 6.5 10*3/uL (ref 4.0–10.5)
nRBC: 0 % (ref 0.0–0.2)

## 2024-01-26 LAB — GLUCOSE, CAPILLARY
Glucose-Capillary: 117 mg/dL — ABNORMAL HIGH (ref 70–99)
Glucose-Capillary: 114 mg/dL — ABNORMAL HIGH (ref 70–99)
Glucose-Capillary: 138 mg/dL — ABNORMAL HIGH (ref 70–99)

## 2024-01-26 LAB — MAGNESIUM: Magnesium: 2.2 mg/dL (ref 1.7–2.4)

## 2024-01-26 LAB — PHOSPHORUS: Phosphorus: 3.4 mg/dL (ref 2.5–4.6)

## 2024-01-26 LAB — TSH: TSH: 4.298 u[IU]/mL (ref 0.350–4.500)

## 2024-01-26 MED ORDER — ACETAMINOPHEN 650 MG RE SUPP: 650.0000 mg | Freq: Four times a day (QID) | RECTAL | Status: DC | PRN

## 2024-01-26 MED ORDER — INSULIN ASPART 100 UNIT/ML IJ SOLN
0.0000 [IU] | Freq: Three times a day (TID) | INTRAMUSCULAR | Status: DC
  Administered 2024-01-27: 2 [IU] via SUBCUTANEOUS
  Administered 2024-01-28: 3 [IU] via SUBCUTANEOUS

## 2024-01-26 MED ORDER — FOLIC ACID 1 MG PO TABS
1.0000 mg | ORAL_TABLET | Freq: Every day | ORAL | Status: DC
  Administered 2024-01-26 – 2024-01-28 (×3): 1 mg via ORAL
  Filled 2024-01-26 (×3): qty 1

## 2024-01-26 MED ORDER — MELATONIN 3 MG PO TABS: 3.0000 mg | ORAL_TABLET | Freq: Every evening | ORAL | Status: DC | PRN

## 2024-01-26 MED ORDER — APIXABAN 5 MG PO TABS
5.0000 mg | ORAL_TABLET | Freq: Two times a day (BID) | ORAL | Status: DC
  Administered 2024-01-26 – 2024-01-28 (×6): 5 mg via ORAL
  Filled 2024-01-26 (×6): qty 1

## 2024-01-26 MED ORDER — FUROSEMIDE 10 MG/ML IJ SOLN
20.0000 mg | Freq: Every day | INTRAMUSCULAR | Status: DC
  Administered 2024-01-26 – 2024-01-28 (×3): 20 mg via INTRAVENOUS
  Filled 2024-01-26 (×3): qty 2

## 2024-01-26 MED ORDER — FERROUS SULFATE 325 (65 FE) MG PO TABS
325.0000 mg | ORAL_TABLET | ORAL | Status: DC
  Administered 2024-01-27: 325 mg via ORAL
  Filled 2024-01-26: qty 1

## 2024-01-26 MED ORDER — SODIUM CHLORIDE 0.9 % IV SOLN
1.0000 g | INTRAVENOUS | Status: DC
  Administered 2024-01-26 – 2024-01-27 (×2): 1 g via INTRAVENOUS
  Filled 2024-01-26 (×2): qty 10

## 2024-01-26 MED ORDER — GABAPENTIN 400 MG PO CAPS
400.0000 mg | ORAL_CAPSULE | Freq: Three times a day (TID) | ORAL | Status: DC
  Administered 2024-01-26 – 2024-01-28 (×7): 400 mg via ORAL
  Filled 2024-01-26 (×7): qty 1

## 2024-01-26 MED ORDER — ONDANSETRON HCL 4 MG/2ML IJ SOLN
4.0000 mg | Freq: Four times a day (QID) | INTRAMUSCULAR | Status: DC | PRN
  Filled 2024-01-26: qty 2

## 2024-01-26 MED ORDER — AMLODIPINE BESYLATE 5 MG PO TABS
2.5000 mg | ORAL_TABLET | Freq: Every day | ORAL | Status: DC
  Administered 2024-01-26 – 2024-01-28 (×3): 2.5 mg via ORAL
  Filled 2024-01-26 (×3): qty 1

## 2024-01-26 MED ORDER — AZITHROMYCIN 250 MG PO TABS
500.0000 mg | ORAL_TABLET | Freq: Every day | ORAL | Status: DC
  Administered 2024-01-26: 500 mg via ORAL
  Filled 2024-01-26: qty 2

## 2024-01-26 MED ORDER — PROPRANOLOL HCL 20 MG PO TABS: 20.0000 mg | ORAL_TABLET | Freq: Two times a day (BID) | ORAL | Status: DC

## 2024-01-26 MED ORDER — FOLIC ACID 800 MCG PO TABS
800.0000 ug | ORAL_TABLET | Freq: Every day | ORAL | Status: DC
Start: 2024-01-26 — End: 2024-01-26

## 2024-01-26 MED ORDER — INSULIN ASPART 100 UNIT/ML IJ SOLN: 0.0000 [IU] | Freq: Every day | INTRAMUSCULAR | Status: DC

## 2024-01-26 MED ORDER — FUSION PLUS PO CAPS: 1.0000 | ORAL_CAPSULE | ORAL | Status: DC

## 2024-01-26 MED ORDER — SODIUM CHLORIDE 0.9 % IV SOLN: 500.0000 mg | INTRAVENOUS | Status: DC

## 2024-01-26 MED ORDER — PANTOPRAZOLE SODIUM 40 MG PO TBEC
40.0000 mg | DELAYED_RELEASE_TABLET | Freq: Every day | ORAL | Status: DC
  Administered 2024-01-26 – 2024-01-28 (×3): 40 mg via ORAL
  Filled 2024-01-26 (×3): qty 1

## 2024-01-26 MED ORDER — TRAZODONE HCL 50 MG PO TABS: 25.0000 mg | ORAL_TABLET | Freq: Every evening | ORAL | Status: DC | PRN

## 2024-01-26 MED ORDER — POTASSIUM CHLORIDE CRYS ER 20 MEQ PO TBCR
20.0000 meq | EXTENDED_RELEASE_TABLET | Freq: Every day | ORAL | Status: DC
  Administered 2024-01-26 – 2024-01-28 (×3): 20 meq via ORAL
  Filled 2024-01-26 (×3): qty 1

## 2024-01-26 MED ORDER — ALBUTEROL SULFATE (2.5 MG/3ML) 0.083% IN NEBU: 2.5000 mg | INHALATION_SOLUTION | RESPIRATORY_TRACT | Status: DC | PRN

## 2024-01-26 MED ORDER — METOPROLOL SUCCINATE ER 50 MG PO TB24
50.0000 mg | ORAL_TABLET | Freq: Every day | ORAL | Status: DC
  Administered 2024-01-26 – 2024-01-28 (×3): 50 mg via ORAL
  Filled 2024-01-26 (×3): qty 1

## 2024-01-26 MED ORDER — ACETAMINOPHEN 325 MG PO TABS
650.0000 mg | ORAL_TABLET | Freq: Four times a day (QID) | ORAL | Status: DC | PRN
  Administered 2024-01-27: 650 mg via ORAL
  Filled 2024-01-26: qty 2

## 2024-01-26 NOTE — TOC Initial Note (Signed)
 Transition of Care [facility]) - Initial/Assessment Note    Patient Details  Name: Anne Roach MRN: [id number] Date of Birth: 30-Jul-1933  Transition of Care [facility]) CM/SW Contact:    Jessie Foot, RN Phone Number: 01/26/2024, 2:16 PM  Clinical Narrative:                 Presented for weakness & palpitations form single family home with daughter Mackie Pai). Patient currently active with [facility] for [facility] (everyday except Wed for 2.5 hours) and [employer] for oxygen (2L PRN). DME- cane rollator, shower chair, bedside commode & wheelchair. Patient has PCP & insurance. Patient's daughter will transport home at discharge via private vehicle. Nurse Case manager will continue to follow for progression to discharge for [facility] and DME needs.   Expected Discharge Plan: Home w Home Health Services (Active with [facility]) Barriers to Discharge: Continued Medical Work up   Patient Goals and CMS Choice Patient states their goals for this hospitalization and ongoing recovery are:: Return home          Expected Discharge Plan and Services In-house Referral: Clinical Social Work Discharge Planning Services: CM Consult   Living arrangements for the past 2 months: Single Family Home                 DME Arranged: N/A         HH Arranged: NA          Prior Living Arrangements/Services Living arrangements for the past 2 months: Single Family Home Lives with:: Adult Children Patient language and need for interpreter reviewed:: Yes Do you feel safe going back to the place where you live?: Yes      Need for Family Participation in Patient Care: No (Comment) Care giver support system in place?: Yes (comment) Current home services: DME, Homehealth aide [facility] and [employer]) Criminal Activity/Legal Involvement Pertinent to Current Situation/Hospitalization: No - Comment as needed  Activities of Daily Living   ADL Screening (condition at time of admission) Independently performs ADLs?: No Does the patient  have a NEW difficulty with bathing/dressing/toileting/self-feeding that is expected to last >3 days?: Yes (Initiates electronic notice to provider for possible OT consult) Does the patient have a NEW difficulty with getting in/out of bed, walking, or climbing stairs that is expected to last >3 days?: Yes (Initiates electronic notice to provider for possible PT consult) Does the patient have a NEW difficulty with communication that is expected to last >3 days?: No Is the patient deaf or have difficulty hearing?: No Does the patient have difficulty seeing, even when wearing glasses/contacts?: No Does the patient have difficulty concentrating, remembering, or making decisions?: No  Permission Sought/Granted Permission sought to share information with : Case Manager, Magazine features editor Permission granted to share information with : Yes, Verbal Permission Granted  Share Information with NAME: [name]  Permission granted to share info w AGENCY: [facility] and [employer]  Permission granted to share info w Relationship: Daughter  Permission granted to share info w Contact Information: [phone]  Emotional Assessment Appearance:: Appears stated age Attitude/Demeanor/Rapport: Lethargic Affect (typically observed): Accepting Orientation: : Oriented to Self, Oriented to Place, Oriented to  Time, Oriented to Situation Alcohol / Substance Use: Not Applicable Psych Involvement: No (comment)  Admission diagnosis:  Pyelonephritis [N12] Hypoxia [R09.02] CAP (community acquired pneumonia) [J18.9] Pleural effusion on left [J90] Pneumonia due to infectious organism, unspecified laterality, unspecified part of lung [J18.9] Acute respiratory failure with hypoxia (HCC) [J96.01] Patient Active Problem List   Diagnosis Date Noted  Acute respiratory failure with hypoxia (HCC) 01/26/2024   CAP (community acquired pneumonia) 01/25/2024   Chest pain 12/04/2023   Symptomatic anemia 10/10/2023   Acute  postoperative anemia due to expected blood loss 10/09/2023   S/P right hemicolectomy 10/08/2023   Cancer of right colon (HCC) 08/11/2023   AKI (acute kidney injury) (HCC) 11/25/2022   Acute renal failure superimposed on stage 3b chronic kidney disease (HCC) 11/23/2022   Lactic acidosis 11/23/2022   Aortic atherosclerosis (HCC) 11/20/2022   Pleural effusion    Dysphagia    Chronic pain 03/27/2022   Permanent atrial fibrillation (HCC) 01/29/2021   Hypercoagulable state due to paroxysmal atrial fibrillation (HCC) 01/29/2021   SIRS (systemic inflammatory response syndrome) (HCC) 10/31/2020   Abdominal pain 10/31/2020   GERD (gastroesophageal reflux disease) 10/31/2020   Chronic diastolic CHF (congestive heart failure) (HCC) 10/31/2020   Chronic atrial fibrillation (HCC) 07/11/2020   Campylobacter gastrointestinal tract infection 01/03/2019   Chronic kidney disease (CKD), stage III (moderate) 01/02/2019   Hypoalbuminemia 01/02/2019   Bandemia without diagnosis of specific infection 01/02/2019   Hypotension 01/02/2019   Bacteria in urine 01/01/2019   Myogenic ptosis of bilateral eyelids 06/24/2016   HTN (hypertension)    Tremor    Exudative age-related macular degeneration of right eye with active choroidal neovascularization (HCC) 08/29/2014   Pseudophakia of both eyes 08/29/2014   OSA (obstructive sleep apnea) 06/29/2012   Type 2 diabetes mellitus (HCC)    Incidental lung nodule 03/10/2009   Other diseases of lung, not elsewhere classified 03/10/2009   PCP:  Leilani Able, MD Pharmacy:   [id number] [place], [place] - [address] AT [facility] OF [facility] & [address] [place] [address] Phone: [phone] Fax: [phone]     Social Drivers of Health (SDOH) Social History: SDOH Screenings   Food Insecurity: No Food Insecurity (01/25/2024)  Housing: Low Risk  (01/26/2024)  Transportation Needs: No Transportation Needs  (01/25/2024)  Utilities: Not At Risk (01/25/2024)  Alcohol Screen: Low Risk  (03/28/2022)  Financial Resource Strain: Low Risk  (03/28/2022)  Social Connections: Patient Unable To Answer (01/26/2024)  Tobacco Use: Low Risk  (01/25/2024)   SDOH Interventions:     Readmission Risk Interventions    10/12/2023    1:15 PM  Readmission Risk Prevention Plan  Transportation Screening Complete  PCP or Specialist Appt within 3-5 Days Complete  HRI or Home Care Consult Complete  Social Work Consult for Recovery Care Planning/Counseling Complete  Palliative Care Screening Not Applicable  Medication Review Oceanographer) Complete

## 2024-01-26 NOTE — H&P (Signed)
 History and Physical  Anne Roach ZOX:[id number] DOB: 09/24/1933 DOA: 01/25/2024  PCP: Leilani Able, MD   Chief Complaint: Weakness, palpitations  HPI: Anne Roach is a 89 y.o. female with medical history significant for chronic heart failure with preserved EF, chronic atrial fibrillation on Eliquis, type 2 diabetes, hypertension, essential tremor being admitted to the hospital with weakness found to have acute hypoxia with community-acquired pneumonia as well as UTI.  Patient lives with her daughter, who is currently at the bedside.  Apparently for the last day or so, patient has been complaining of generalized weakness.  She has also been feeling some palpitations, has been compliant with her medications.  She was recently admitted to [facility], where daughter says that she had about 1.4 L thoracentesis on the left side.  I was able to review discharge summary from her recent hospital stay, she was at [facility] 2/2 to 2/8 for acute hypoxic respiratory failure in the setting of atelectasis from large left pleural effusion, and intractable vomiting in the setting of esophageal dysmotility.  She has chronic nausea and vomiting, she underwent EGD on 12/25/2023 which was unremarkable for any strictures.  She does have known significant esophageal dysmotility.  On workup in the emergency department, lab work is relatively unremarkable, however patient has recurrent hypoxia.  Chest x-ray with stable left pleural effusion, and stable right sided opacities.  Urinalysis positive for infection, though the patient denies any dysuria, urgency or frequency of urination.  There was some concern about pyelonephritis, however patient's daughter states that she did not complain of any back or flank pain until she had been sitting in the ER waiting area for 5 hours yesterday.  Currently patient vital signs are stable, she remains in atrial fibrillation with heart rate in the 110s.  She feels generally unwell, but denies  any specific pain, nausea or other complaints.  Review of Systems: Please see HPI for pertinent positives and negatives. A complete 10 system review of systems are otherwise negative.  Past Medical History:  Diagnosis Date   A-fib [facility])    Acute on chronic diastolic CHF (congestive heart failure) (HCC) 03/27/2022   Anemia    Arthritis    Atrial fibrillation (HCC)    Atrial fibrillation with RVR (HCC) 03/20/2017   Cancer (HCC)    colon cancer   CAP (community acquired pneumonia) 01/25/2024   Chronic diastolic CHF (congestive heart failure) (HCC) 10/31/2020   DM (diabetes mellitus) (HCC)    HTN (hypertension)    Occasional tremors    on Inderal for this   Sepsis secondary to UTI (HCC) 07/11/2020   Sleep apnea    not used cpap in a long time per daughter   Tremor    Past Surgical History:  Procedure Laterality Date   APPENDECTOMY     BIOPSY  12/23/2022   Procedure: BIOPSY;  Surgeon: Kerin Salen, MD;  Location: [facility];  Service: Gastroenterology;;   BIOPSY  08/03/2023   Procedure: BIOPSY;  Surgeon: Kerin Salen, MD;  Location: [facility];  Service: Gastroenterology;;   CARPAL TUNNEL RELEASE     CATARACT EXTRACTION Bilateral    COLONOSCOPY WITH PROPOFOL N/A 08/03/2023   Procedure: COLONOSCOPY WITH PROPOFOL;  Surgeon: Kerin Salen, MD;  Location: [facility];  Service: Gastroenterology;  Laterality: N/A;   ESOPHAGEAL DILATION  12/23/2022   Procedure: ESOPHAGEAL DILATION;  Surgeon: Kerin Salen, MD;  Location: [facility];  Service: Gastroenterology;;   ESOPHAGOGASTRODUODENOSCOPY (EGD) WITH PROPOFOL N/A 12/23/2022   Procedure: ESOPHAGOGASTRODUODENOSCOPY (EGD) WITH  PROPOFOL W/ DILATION;  Surgeon: Kerin Salen, MD;  Location: [facility];  Service: Gastroenterology;  Laterality: N/A;  with dilation   HERNIA REPAIR     LAPAROSCOPIC RIGHT HEMI COLECTOMY Right 10/08/2023   Procedure: LAPAROSCOPIC RIGHT HEMICOLECTOMY;  Surgeon: Andria Meuse, MD;  Location: [facility];   Service: General;  Laterality: Right;   POLYPECTOMY  08/03/2023   Procedure: POLYPECTOMY;  Surgeon: Kerin Salen, MD;  Location: [facility];  Service: Gastroenterology;;   tarsal tunnel surgery      Social History:  reports that she has never smoked. She has never been exposed to tobacco smoke. She has never used smokeless tobacco. She reports that she does not drink alcohol and does not use drugs.  No Known Allergies  Family History  Problem Relation Age of Onset   Diabetes Father    Asthma Brother    Cancer Daughter 27       BREAST CANCER     Prior to Admission medications   Medication Sig Start Date End Date Taking? Authorizing Provider  metoCLOPramide (REGLAN) 10 MG tablet Take 10 mg by mouth 3 (three) times daily. 12/26/23  Yes [provider]  nystatin ointment (MYCOSTATIN) Apply topically 2 (two) times daily. 12/15/23  Yes [provider]  ondansetron (ZOFRAN-ODT) 4 MG disintegrating tablet Take by mouth. 12/26/23  Yes [provider]  pantoprazole (PROTONIX) 40 MG tablet Take by mouth. 12/26/23 03/25/24 Yes [provider]  propranolol (INDERAL) 20 MG tablet Take 20 mg by mouth 2 (two) times daily. 12/26/23  Yes [provider]  ACCU-CHEK AVIVA PLUS test strip  10/06/20   [provider]  acetaminophen (TYLENOL) 500 MG tablet Take 1,000 mg by mouth every 6 (six) hours as needed for mild pain or headache.    [provider]  allopurinol (ZYLOPRIM) 100 MG tablet TAKE 1 TABLET(100 MG) BY MOUTH DAILY Patient taking differently: Take 100 mg by mouth daily. 08/09/21   Felecia Shelling, DPM  amLODipine (NORVASC) 2.5 MG tablet Take 1 tablet by mouth daily. 12/27/23 01/26/24  [provider]  Cholecalciferol (VITAMIN D) 125 MCG (5000 UT) CAPS Take 5,000 Units by mouth daily with breakfast.    [provider]  diclofenac Sodium (VOLTAREN) 1 % GEL Apply 4 g topically 4 (four) times daily. 11/29/23   Melene Plan, DO  ELIQUIS 5  MG TABS tablet TAKE 1 TABLET(5 MG) BY MOUTH TWICE DAILY Patient taking differently: Take 5 mg by mouth 2 (two) times daily. 08/24/23   Wendall Stade, MD  empagliflozin (JARDIANCE) 10 MG TABS tablet TAKE 1 TABLET(10 MG) BY MOUTH DAILY 12/24/23   Wendall Stade, MD  folic acid (FOLVITE) 800 MCG tablet Take 800 mcg by mouth daily.    [provider]  furosemide (LASIX) 20 MG tablet Take 1 tablet (20 mg total) by mouth daily. 11/25/23   Gaston Islam., NP  gabapentin (NEURONTIN) 400 MG capsule Take 400 mg by mouth 3 (three) times daily. 03/25/22   [provider]  Iron-FA-B Cmp-C-Biot-Probiotic (FUSION PLUS) CAPS Take 1 capsule by mouth every other day. 08/26/23   [provider]  loratadine (CLARITIN) 10 MG tablet Take 10 mg by mouth daily as needed for allergies. 08/28/23   [provider]  metoprolol succinate (TOPROL XL) 50 MG 24 hr tablet Take 1 tablet (50 mg total) by mouth daily. Take with or immediately following a meal. 11/25/23   Gaston Islam., NP  metoprolol tartrate (LOPRESSOR) 25  MG tablet Take 1 tablet (25 mg total) by mouth daily as needed (palpitations). Patient not taking: Reported on 12/05/2023 11/25/23   Gaston Islam., NP  Multiple Vitamins-Minerals (ICAPS AREDS 2 PO) Take 1 capsule by mouth 2 (two) times daily.    [provider]  Potassium Chloride ER 20 MEQ TBCR Take 20 mEq by mouth daily. 04/09/22   Clegg, Amy D, NP    Physical Exam: BP (!) 148/112 (BP Location: Right Arm)   Pulse 93   Temp 97.6 F (36.4 C)   Resp 18   Ht 5\' 5"  (1.651 m)   Wt 68 kg   SpO2 97%   BMI 24.96 kg/m  General:  Alert, oriented, calm, in no acute distress, resting on room air on my arrival.  Her daughter is at the bedside. Eyes: EOMI, clear conjuctivae, white sclerea Neck: supple, no masses, trachea mildline  Cardiovascular: Irregularly irregular, heart rate about 100., no murmurs or rubs, no peripheral edema  Respiratory: Breath sounds are  distant bilaterally, reduced on the left side, with some mild basilar crackles on the right side, no tachypnea, wheezing or rhonchi Abdomen: soft, nontender, nondistended, normal bowel tones heard  Skin: dry, no rashes  Musculoskeletal: no joint effusions, normal range of motion  Psychiatric: appropriate affect, normal speech  Neurologic: extraocular muscles intact, clear speech, moving all extremities with intact sensorium         Labs on Admission:  Basic Metabolic Panel: Recent Labs  Lab 01/25/24 1503 01/26/24 0529  NA 138 138  K 4.3 3.9  CL 102 103  CO2 27 26  GLUCOSE 101* 97  BUN 12 13  CREATININE 0.98 0.93  CALCIUM 8.9 8.9  MG  --  2.2  PHOS  --  3.4   Liver Function Tests: Recent Labs  Lab 01/26/24 0529  AST 16  ALT 9  ALKPHOS 40  BILITOT 0.6  PROT 6.7  ALBUMIN 3.2*   No results for input(s): "LIPASE", "AMYLASE" in the last 168 hours. No results for input(s): "AMMONIA" in the last 168 hours. CBC: Recent Labs  Lab 01/25/24 1503 01/26/24 0529  WBC 6.6 6.5  NEUTROABS 3.9 3.1  HGB 13.0 12.8  HCT 40.6 42.7  MCV 93.5 98.4  PLT 199 203   Cardiac Enzymes: No results for input(s): "CKTOTAL", "CKMB", "CKMBINDEX", "TROPONINI" in the last 168 hours. BNP (last 3 results) Recent Labs    11/21/23 1230 01/25/24 1503  BNP 704.9* 283.0*    ProBNP (last 3 results) No results for input(s): "PROBNP" in the last 8760 hours.  CBG: No results for input(s): "GLUCAP" in the last 168 hours.  Radiological Exams on Admission: DG Chest 2 View Result Date: 01/25/2024 CLINICAL DATA:  Generalized weakness EXAM: CHEST - 2 VIEW COMPARISON:  Chest x-ray 12/04/2023 FINDINGS: There is a large left pleural effusion, unchanged. Minimal patchy opacities are seen in the right infrahilar region, unchanged. Cardiomediastinal silhouette is stable. There is no pneumothorax or acute fracture. IMPRESSION: 1. Stable large left pleural effusion. 2. Stable minimal patchy opacities in the  right infrahilar region. Electronically Signed   By: Darliss Cheney M.D.   On: 01/25/2024 18:47   Assessment/Plan Anne Roach is a 89 y.o. female with medical history significant for chronic heart failure with preserved EF, chronic atrial fibrillation on Eliquis, type 2 diabetes, hypertension, essential tremor being admitted to the hospital with weakness found to have acute hypoxia with community-acquired pneumonia as well as UTI.   Acute hypoxic respiratory failure-suspect  this is due to continued left-sided effusion, felt to likely be due to chronic aspiration pneumonitis.  The patient is afebrile with normal white blood cell count, she does have some right-sided consolidation.  Suspected community-acquired pneumonia.  May also be due to recurrent left-sided pleural effusion. -Inpatient admission -Supplemental oxygen, wean as tolerated -Incentive spirometry -Treat possible pneumonia as below -Her diuretics were placed on hold early last month at hospital discharge.  Though she does not look floridly fluid overloaded, and BNP is improved from previous, will diurese as it may help with her hypoxia and effusions.  Community-acquired pneumonia-suspected due to hypoxia, consolidations seen on chest x-ray -Empiric IV azithromycin, IV Rocephin  UTI-with abnormal urinalysis, and generally feeling weak.  Unfortunately urine culture was not obtained. -Continue empiric IV Rocephin  A-fib with RVR-history of permanent atrial fibrillation, currently experiencing some palpitations likely due to not receiving her beta-blocker yesterday.  Also likely exacerbated in the setting of acute infection.  Note normal TSH. -Continue Toprol-XL -Continue Eliquis  Esophageal dysmotility-with likely chronic aspiration, leading to continued effusions.  Has been followed by GI as an outpatient, had recent upper endoscopy 2/7 without stricture or other obvious complication. -Continue oral PPI -Small frequent  meals  Chronic heart failure with preserved EF--without evidence of acute heart failure exacerbation, but due to recurrent effusion as above will diurese gently -IV Lasix 20 mg daily -Continue potassium supplementation  Type 2 diabetes-carb modified diet, with moderate dose sliding scale  Hypertension-amlodipine 2.5 mg p.o. daily  Essential tremor-her propranolol was discontinued previously when she was placed on metoprolol for atrial fibrillation.  Her tremor continues, family is planning outpatient neurology follow-up.  Peripheral neuropathy-Neurontin 400 mg p.o. 3 times daily  DVT prophylaxis: Continue Eliquis    Code Status: Full Code  Consults called: None  Admission status: The appropriate patient status for this patient is INPATIENT. Inpatient status is judged to be reasonable and necessary in order to provide the required intensity of service to ensure the patient's safety. The patient's presenting symptoms, physical exam findings, and initial radiographic and laboratory data in the context of their chronic comorbidities is felt to place them at high risk for further clinical deterioration. Furthermore, it is not anticipated that the patient will be medically stable for discharge from the hospital within 2 midnights of admission.    I certify that at the point of admission it is my clinical judgment that the patient will require inpatient hospital care spanning beyond 2 midnights from the point of admission due to high intensity of service, high risk for further deterioration and high frequency of surveillance required  Time spent: 65 minutes  Jhayden Demuro Sharlette Dense MD [facility] Pager [phone]  If 7PM-7AM, please contact night-coverage [email or web address] Password [facility]  01/26/2024, 9:50 AM

## 2024-01-26 NOTE — Evaluation (Signed)
 Physical Therapy Evaluation Patient Details Name: Anne Roach MRN: [id number] DOB: 02/13/1933 Today's Date: 01/26/2024  History of Present Illness  89 yo female admitted with Pna, UTI. Hx of Afib, DM, CKD, colon Ca, CHF, appendectomy.  Clinical Impression  Limited eval on today-pt resistant/refusing to participate despite encouragement from therapist and daughter. Hopefully, pt will be more willing to work with therapy on next visit. Daughter reports plan is for home once medically ready.         If plan is discharge home, recommend the following: Assistance with cooking/housework;Assist for transportation;Help with stairs or ramp for entrance;A lot of help with walking and/or transfers;A lot of help with bathing/dressing/bathroom   Can travel by private vehicle        Equipment Recommendations None recommended by PT  Recommendations for Other Services       Functional Status Assessment Patient has had a recent decline in their functional status and demonstrates the ability to make significant improvements in function in a reasonable and predictable amount of time.     Precautions / Restrictions Precautions Precautions: Fall Restrictions Weight Bearing Restrictions Per Provider Order: No      Mobility  Bed Mobility               General bed mobility comments: NT-pt resistant/refused to participate despite therapist and family encouragement    Transfers                        Ambulation/Gait                  Stairs            Wheelchair Mobility     Tilt Bed    Modified Rankin (Stroke Patients Only)       Balance                                             Pertinent Vitals/Pain Pain Assessment Pain Assessment: Faces Faces Pain Scale: No hurt    Home Living Family/patient expects to be discharged to:: Private residence Living Arrangements: Children Available Help at Discharge: Family;Available  PRN/intermittently;Personal care attendant   Home Access: Level entry;Ramped entrance       Home Layout: Multi-level;Able to live on main level with bedroom/bathroom Home Equipment: Rolling Walker (2 wheels);Grab bars - tub/shower;Shower seat - built in;BSC/3in1;Rollator (4 wheels);Hand held shower head;Other (comment);Electric scooter;Transport chair;Lift chair Additional Comments: Life Alert (but doesn't use it), HHA-M, Tu, Th, Fr, and every other Sat for about 2.5 hours/day. Daughter reports pt has adjustable bed but prefers flat bed.  Dtr home with pt on Wednesdays. Daughter is a professor.    Prior Function               Mobility Comments: daughter reports pt amb with rollator around the home and in community; pt declines w/c or scooter in community and prefers to walk; recently 1 fall last week in bathroom, able to get up independently and told daughter later ADLs Comments: daughter reports supv/A as needed with bathing, pt performs toileting and dressing; aide assists with bathing and household chores     Extremity/Trunk Assessment   Upper Extremity Assessment Upper Extremity Assessment:  (attempted to assess pt but was resistant-refused to particiapte)    Lower Extremity Assessment Lower Extremity Assessment:  (attempted to assess pt but was  resistant-refused to participate)       Communication        Cognition Arousal: Lethargic Behavior During Therapy: Flat affect                           PT - Cognition Comments: unable to assess. daughter speaking to patient in native language. pt unwilling to speak to/acknowledge therapist         Cueing       General Comments      Exercises     Assessment/Plan    PT Assessment Patient needs continued PT services  PT Problem List Decreased strength;Decreased range of motion;Decreased activity tolerance;Decreased balance;Decreased mobility;Decreased knowledge of use of DME;Pain       PT Treatment  Interventions DME instruction;Gait training;Functional mobility training;Therapeutic activities;Therapeutic exercise;Patient/family education;Balance training    PT Goals (Current goals can be found in the Care Plan section)  Acute Rehab PT Goals Patient Stated Goal: daughter reports plan for home PT Goal Formulation: With family Time For Goal Achievement: 02/09/24 Potential to Achieve Goals: Good    Frequency Min 2X/week     Co-evaluation               AM-PAC PT "6 Clicks" Mobility  Outcome Measure Help needed turning from your back to your side while in a flat bed without using bedrails?: A Lot Help needed moving from lying on your back to sitting on the side of a flat bed without using bedrails?: A Lot Help needed moving to and from a bed to a chair (including a wheelchair)?: A Lot Help needed standing up from a chair using your arms (e.g., wheelchair or bedside chair)?: A Lot Help needed to walk in hospital room?: A Lot Help needed climbing 3-5 steps with a railing? : A Lot 6 Click Score: 12    End of Session   Activity Tolerance: Patient limited by lethargy Patient left: in bed;with call bell/phone within reach;with bed alarm set;with family/visitor present   PT Visit Diagnosis: Muscle weakness (generalized) (M62.81)    Time: 1142-1150 PT Time Calculation (min) (ACUTE ONLY): 8 min   Charges:   PT Evaluation $PT Eval Low Complexity: 1 Low   PT General Charges $$ ACUTE PT VISIT: 1 Visit           Faye Ramsay, PT Acute Rehabilitation  Office: [phone]

## 2024-01-26 NOTE — Plan of Care (Signed)

## 2024-01-26 NOTE — Plan of Care (Signed)

## 2024-01-26 NOTE — Progress Notes (Signed)
(  Carryover admission to the Day Admitter; accepted by Dr.  Margo Aye as transfer from  [facility]  to a  med-tele bed at  [facility]  for community-acquired pneumonia as well as urinary tract infection and potential pyelonephritis. Please see Dr.  Scharlene Gloss transfer documentation in [facility] Communication  for additional details).  This is a 89 year old female with medical history that includes paroxysmal atrial fibrillation chronically anticoagulated on Eliquis.    I have placed some additional preliminary admit orders via the adult multi-morbid admission order set. I have continued the azithromycin and Rocephin that were started at [facility] as empiric coverage of community-acquired pneumonia as well as coverage for urinary tract infection.  I have also ordered morning labs in the form of CMP, CBC, magnesium and phosphorus levels, and have resumed her home Eliquis.   Newton Pigg, DO Hospitalist

## 2024-01-27 DIAGNOSIS — J9601 Acute respiratory failure with hypoxia: Secondary | ICD-10-CM | POA: Diagnosis not present

## 2024-01-27 LAB — BASIC METABOLIC PANEL
Anion gap: 12 (ref 5–15)
BUN: 16 mg/dL (ref 8–23)
CO2: 26 mmol/L (ref 22–32)
Calcium: 8.7 mg/dL — ABNORMAL LOW (ref 8.9–10.3)
Chloride: 100 mmol/L (ref 98–111)
Creatinine, Ser: 1.11 mg/dL — ABNORMAL HIGH (ref 0.44–1.00)
GFR, Estimated: 48 mL/min — ABNORMAL LOW (ref >60)
Glucose, Bld: 98 mg/dL (ref 70–99)
Potassium: 3.9 mmol/L (ref 3.5–5.1)
Sodium: 138 mmol/L (ref 135–145)

## 2024-01-27 LAB — GLUCOSE, CAPILLARY
Glucose-Capillary: 107 mg/dL — ABNORMAL HIGH (ref 70–99)
Glucose-Capillary: 149 mg/dL — ABNORMAL HIGH (ref 70–99)
Glucose-Capillary: 136 mg/dL — ABNORMAL HIGH (ref 70–99)
Glucose-Capillary: 141 mg/dL — ABNORMAL HIGH (ref 70–99)

## 2024-01-27 LAB — CBC
HCT: 38.8 % (ref 36.0–46.0)
Hemoglobin: 12 g/dL (ref 12.0–15.0)
MCH: 30 pg (ref 26.0–34.0)
MCHC: 30.9 g/dL (ref 30.0–36.0)
MCV: 97 fL (ref 80.0–100.0)
Platelets: 177 10*3/uL (ref 150–400)
RBC: 4 MIL/uL (ref 3.87–5.11)
RDW: 15.4 % (ref 11.5–15.5)
WBC: 6.7 10*3/uL (ref 4.0–10.5)
nRBC: 0 % (ref 0.0–0.2)

## 2024-01-27 MED ORDER — METOCLOPRAMIDE HCL 10 MG PO TABS
10.0000 mg | ORAL_TABLET | Freq: Three times a day (TID) | ORAL | Status: DC
  Administered 2024-01-27 – 2024-01-28 (×4): 10 mg via ORAL
  Filled 2024-01-27 (×4): qty 1

## 2024-01-27 MED ORDER — ALLOPURINOL 100 MG PO TABS
100.0000 mg | ORAL_TABLET | Freq: Every day | ORAL | Status: DC
  Administered 2024-01-27 – 2024-01-28 (×2): 100 mg via ORAL
  Filled 2024-01-27 (×2): qty 1

## 2024-01-27 NOTE — Evaluation (Signed)
 Occupational Therapy Evaluation Patient Details Name: Anne Roach MRN: [id number] DOB: 07-11-1933 Today's Date: 01/27/2024   History of Present Illness   89 yo female admitted with Pna, UTI. Hx of Afib, DM, CKD, colon Ca, CHF, appendectomy.     Clinical Impressions Pt is typically mod I for ADL and mobility with Rollator. She is supervision for bathing from seated position for fall prevention from aide. She really wants to be able to help more in the kitchen cooking as she loves to cook and is famous for her spinach pies. Today she was pleasant and willing to work with therapy. She demonstrated LB dressing, hallway ambulation with SpO2 >90% on RA with no SOB. Standing grooming tasks at [facility]. Recommending HHOT post-acute with emphasis on cooking and energy conservation in addition to typical ADL access.      If plan is discharge home, recommend the following:   A little help with walking and/or transfers;A little help with bathing/dressing/bathroom;Assistance with cooking/housework     Functional Status Assessment   Patient has had a recent decline in their functional status and demonstrates the ability to make significant improvements in function in a reasonable and predictable amount of time.     Equipment Recommendations   None recommended by OT     Recommendations for Other Services   PT consult     Precautions/Restrictions   Precautions Precautions: Fall Recall of Precautions/Restrictions: Intact Restrictions Weight Bearing Restrictions Per Provider Order: No     Mobility Bed Mobility               General bed mobility comments: OOB in recliner at beginning and end of session    Transfers Overall transfer level: Needs assistance Equipment used: Rolling walker (2 wheels) Transfers: Sit to/from Stand Sit to Stand: Contact guard assist           General transfer comment: good hand placement, GCA for safety and balance      Balance  Overall balance assessment: Mild deficits observed, not formally tested                                         ADL either performed or assessed with clinical judgement   ADL Overall ADL's : Needs assistance/impaired Eating/Feeding: Modified independent   Grooming: Wash/dry face;Wash/dry hands;Sitting Grooming Details (indicate cue type and reason): typically stands Upper Body Bathing: Set up;Sitting   Lower Body Bathing: Contact guard assist;Sitting/lateral leans Lower Body Bathing Details (indicate cue type and reason): seated for safety and activity tolerance Upper Body Dressing : Set up;Sitting   Lower Body Dressing: Minimal assistance;With caregiver independent assisting;Sit to/from stand   Toilet Transfer: Contact guard assist;Ambulation;Rolling walker (2 wheels)   Toileting- Clothing Manipulation and Hygiene: Contact guard assist;Sitting/lateral lean       Functional mobility during ADLs: Contact guard assist;Rolling walker (2 wheels) General ADL Comments: decreased activity tolerance, decreased balance     Vision Patient Visual Report: No change from baseline Vision Assessment?: No apparent visual deficits     Perception         Praxis         Pertinent Vitals/Pain Pain Assessment Pain Assessment: 0-10 Pain Score: 9  Pain Location: R knee Pain Descriptors / Indicators: Grimacing, Guarding, Sharp, Sore Pain Intervention(s): Limited activity within patient's tolerance, Monitored during session, RN gave pain meds during session     Extremity/Trunk Assessment Upper Extremity  Assessment Upper Extremity Assessment: Generalized weakness   Lower Extremity Assessment Lower Extremity Assessment: Defer to PT evaluation   Cervical / Trunk Assessment Cervical / Trunk Assessment: Kyphotic   Communication Communication Communication: Other (comment) (Daughter prefers to interpret over iPad) Factors Affecting Communication: Non - English speaking,  interpreter not available (DOES speak english for the most part - but does look to her daughter for occasional translation)   Cognition Arousal: Alert Behavior During Therapy: WFL for tasks assessed/performed Cognition: No apparent impairments             OT - Cognition Comments: Pt does speak english, but alternates between Arabic (native tongue) and english. Follows directions, enjoyed jokes and tango music. Daughter is present and assists. Assume baseline.                 Following commands: Intact       Cueing  General Comments   Cueing Techniques: Verbal cues;Gestural cues  Pt >90% on RA with activity   Exercises     Shoulder Instructions      Home Living Family/patient expects to be discharged to:: Private residence Living Arrangements: Children Available Help at Discharge: Family;Available PRN/intermittently;Personal care attendant (daughter works in Nutritional therapist and also Manufacturing engineer at [employer]) Type of Home: House Home Access: Level entry;Ramped entrance     Home Layout: Multi-level;Able to live on main level with bedroom/bathroom     Bathroom Shower/Tub: Producer, television/film/video: Handicapped height Bathroom Accessibility: Yes How Accessible: Accessible via walker Home Equipment: Rolling Walker (2 wheels);Grab bars - tub/shower;Shower seat - built in;BSC/3in1;Rollator (4 wheels);Hand held shower head;Other (comment);Electric scooter;Transport chair;Lift chair   Additional Comments: Life Alert (but doesn't use it), HHA-M, Tu, Th, Fr, and every other Sat for about 2.5 hours/day. Daughter reports pt has adjustable bed but prefers flat bed.  Dtr home with pt on Wednesdays. Daughter is a professor and also works at [employer] in [employer]      Prior Functioning/Environment Prior Level of Function : Needs assist             Mobility Comments: daughter reports pt amb with rollator around the home and in community; pt declines w/c or scooter in community and  prefers to walk; recently 1 fall last week in bathroom, able to get up independently and told daughter later ADLs Comments: daughter reports supv/A as needed with bathing, pt performs toileting and dressing; aide assists with bathing and household chores - Pt ENJOYS cooking and likes to help in the kitchen    OT Problem List: Decreased activity tolerance;Decreased safety awareness;Pain   OT Treatment/Interventions: Self-care/ADL training;Energy conservation;DME and/or AE instruction;Therapeutic activities;Balance training;Patient/family education      OT Goals(Current goals can be found in the care plan section)   Acute Rehab OT Goals Patient Stated Goal: be able to help cooking OT Goal Formulation: With patient Time For Goal Achievement: 02/10/24 Potential to Achieve Goals: Good ADL Goals Pt Will Perform Grooming: with modified independence;standing Pt Will Perform Upper Body Dressing: with modified independence;sitting Pt Will Perform Lower Body Dressing: with modified independence;sit to/from stand Pt Will Transfer to Toilet: with modified independence;ambulating Pt Will Perform Toileting - Clothing Manipulation and hygiene: with modified independence;sit to/from stand   OT Frequency:  Min 2X/week    Co-evaluation              AM-PAC OT "6 Clicks" Daily Activity     Outcome Measure Help from another person eating meals?: None Help from another person  taking care of personal grooming?: A Little Help from another person toileting, which includes using toliet, bedpan, or urinal?: A Little Help from another person bathing (including washing, rinsing, drying)?: A Little Help from another person to put on and taking off regular upper body clothing?: None Help from another person to put on and taking off regular lower body clothing?: A Little 6 Click Score: 20   End of Session Equipment Utilized During Treatment: Gait belt;Rolling walker (2 wheels) Nurse Communication:  Mobility status  Activity Tolerance: Patient tolerated treatment well Patient left: in chair;with call bell/phone within reach;with family/visitor present;with chair alarm set  OT Visit Diagnosis: Unsteadiness on feet (R26.81);Muscle weakness (generalized) (M62.81);Pain Pain - Right/Left: Right Pain - part of body: Knee                Time: 1610-9604 OT Time Calculation (min): 36 min Charges:  OT General Charges $OT Visit: 1 Visit OT Evaluation $OT Eval Low Complexity: 1 Low OT Treatments $Self Care/Home Management : 8-22 mins  Nyoka Cowden OTR/L Acute Rehabilitation Services Office: [phone]  Evern Bio [facility] 01/27/2024, 2:07 PM

## 2024-01-27 NOTE — Plan of Care (Signed)

## 2024-01-27 NOTE — TOC CM/SW Note (Signed)
 Patient is active with [facility]. PT has been arranged. Spoke with Watchung @ [facility].

## 2024-01-27 NOTE — Progress Notes (Signed)
 [facility] Progress Note  Patient: Anne Roach     RUE:[id number]  DOA: 01/25/2024   PCP: Leilani Able, MD       Brief hospital course: This is an 89 year old female with HFpEF, atrial fibrillation, diabetes mellitus, esophageal dysmotility, hypertension who was admitted to the hospital for hypoxia.  The patient initially presented because of generalized weakness for the past couple of days.  In the ED she was found to be hypoxic.  Of note the patient was hospitalized in February with a left-sided pleural effusion and possible aspiration pneumonia.  She underwent a thoracentesis at that time and had 1.4 L of fluid removed which was felt to be an exudate. In the ED, UA was suggestive of UTI.  Chest x-ray revealed a left-sided pleural effusion and mild right sided infiltrate.  She was started on antibiotics for treatment of pneumonia and a UTI  Subjective:  No complaints of cough or shortness of breath.  She has pain in her legs which is chronic.  She feels that her weakness is a little better than yesterday.  Assessment and Plan: Principal Problem:   Acute respiratory failure with hypoxia-left-sided pleural effusion History of HFrEF-EF of 35 to 40% -Pulse ox today is in the 90s on room air at rest and with ambulation - Currently receiving IV Lasix 20 mg-the patient states that she was recommended to stop taking her Lasix when she was last discharged from the hospital -Can continue Lasix but will hold off on a thoracentesis as she is essentially asymptomatic at this time -Does not appear to have pneumonia-DC azithromycin  Active Problems:  Possible urinary tract infection -Continue ceftriaxone  History of esophageal dysmotility - Appears that she takes Reglan as outpatient-will resume  Hypertension - Amlodipine  Gout - Allopurinol  Atrial fibrillation with RVR (HCC) -Apixaban and metoprolol      Code Status: Full Code Total time on patient care: 35 min DVT  prophylaxis:  SCDs Start: 01/26/24 0027 apixaban (ELIQUIS) tablet 5 mg     Objective:   Vitals:   01/26/24 1922 01/27/24 0520 01/27/24 1102 01/27/24 1143  BP: (!) 109/51 137/74  (!) 110/59  Pulse: 83 69  60  Resp: 15 15    Temp: 98.1 F (36.7 C) 98.3 F (36.8 C)  98.2 F (36.8 C)  TempSrc:      SpO2: 99% 91% 97% 98%  Weight:      Height:       Filed Weights   01/25/24 1457  Weight: 68 kg   Exam: General exam: Appears comfortable  HEENT: oral mucosa moist Respiratory system: Clear to auscultation.  Cardiovascular system: S1 & S2 heard  Gastrointestinal system: Abdomen soft, non-tender, nondistended. Normal bowel sounds   Extremities: No cyanosis, clubbing or edema Psychiatry:  Mood & affect appropriate.    CBC: Recent Labs  Lab 01/25/24 1503 01/26/24 0529 01/27/24 0550  WBC 6.6 6.5 6.7  NEUTROABS 3.9 3.1  --   HGB 13.0 12.8 12.0  HCT 40.6 42.7 38.8  MCV 93.5 98.4 97.0  PLT 199 203 177   Basic Metabolic Panel: Recent Labs  Lab 01/25/24 1503 01/26/24 0529 01/27/24 0550  NA 138 138 138  K 4.3 3.9 3.9  CL 102 103 100  CO2 27 26 26   GLUCOSE 101* 97 98  BUN 12 13 16   CREATININE 0.98 0.93 1.11*  CALCIUM 8.9 8.9 8.7*  MG  --  2.2  --   PHOS  --  3.4  --  Scheduled Meds:  allopurinol  100 mg Oral Daily   amLODipine  2.5 mg Oral Daily   apixaban  5 mg Oral BID   azithromycin  500 mg Oral Daily   ferrous sulfate  325 mg Oral QODAY   folic acid  1 mg Oral Daily   furosemide  20 mg Intravenous Daily   gabapentin  400 mg Oral TID   insulin aspart  0-15 Units Subcutaneous TID WC   insulin aspart  0-5 Units Subcutaneous QHS   metoCLOPramide  10 mg Oral TID   metoprolol succinate  50 mg Oral Daily   pantoprazole  40 mg Oral Daily   potassium chloride SA  20 mEq Oral Daily    Imaging and lab data personally reviewed   Author: Calvert Cantor  01/27/2024 1:19 PM  To contact [facility]>   Check the care team in [facility] and look for the  attending/consulting [facility] provider listed  Log into [email or web address] and use [place]'s universal password   Go to> "[facility]"  and find provider  If you still have difficulty reaching the provider, please page the [facility] (Director on Call) for the Hospitalists listed on amion

## 2024-01-28 DIAGNOSIS — R0902 Hypoxemia: Secondary | ICD-10-CM | POA: Diagnosis present

## 2024-01-28 DIAGNOSIS — J9601 Acute respiratory failure with hypoxia: Secondary | ICD-10-CM | POA: Diagnosis not present

## 2024-01-28 LAB — GLUCOSE, CAPILLARY
Glucose-Capillary: 156 mg/dL — ABNORMAL HIGH (ref 70–99)
Glucose-Capillary: 136 mg/dL — ABNORMAL HIGH (ref 70–99)

## 2024-01-28 MED ORDER — FLUTICASONE PROPIONATE 50 MCG/ACT NA SUSP: 2.0000 | Freq: Every day | NASAL | 0 refills | Status: DC

## 2024-01-28 MED ORDER — FLUTICASONE PROPIONATE 50 MCG/ACT NA SUSP
2.0000 | Freq: Every day | NASAL | Status: DC
  Filled 2024-01-28: qty 16

## 2024-01-28 MED ORDER — AMLODIPINE BESYLATE 2.5 MG PO TABS: 2.5000 mg | ORAL_TABLET | Freq: Every day | ORAL | Status: DC

## 2024-01-28 NOTE — Care Management Obs Status (Signed)
 MEDICARE OBSERVATION STATUS NOTIFICATION   Patient Details  Name: Anne Roach MRN: [id number] Date of Birth: 11/26/1932   Medicare Observation Status Notification Given:  Yes    Jessie Foot, RN 01/28/2024, 11:27 AM

## 2024-01-28 NOTE — Care Management CC44 (Signed)
 Condition Code 44 Documentation Completed  Patient Details  Name: Anne Roach MRN: [id number] Date of Birth: 1933/02/18   Condition Code 44 given:  Yes Patient signature on Condition Code 44 notice:  Yes Documentation of 2 MD's agreement:  Yes Code 44 added to claim:  Yes    Jessie Foot, RN 01/28/2024, 11:27 AM

## 2024-01-28 NOTE — TOC Transition Note (Signed)
 Transition of Care [facility]) - Discharge Note   Patient Details  Name: Anne Roach MRN: [id number] Date of Birth: 01/16/1933  Transition of Care [facility]) CM/SW Contact:  Jessie Foot, RN Phone Number: 01/28/2024, 12:40 PM   Clinical Narrative:    Patient will DC to: Home Anticipated DC date: 01/28/2024 Family notified: Daughter per patient Transport by: Private Vehicle   Per MD patient ready for DC to . Pt to return home with home health PT/OT through [facility]. Rollator delivered to room by [employer]. No further TOC needs identified  Please consult [place] again if new needs arise.     Final next level of care: Home w Home Health Services Barriers to Discharge: No Barriers Identified   Patient Goals and CMS Choice Patient states their goals for this hospitalization and ongoing recovery are:: Return home          Discharge Placement                  Name of family member notified: [name] (daughter) per patient    Discharge Plan and Services Additional resources added to the After Visit Summary for   In-house Referral: Clinical Social Work Discharge Planning Services: CM Consult            DME Arranged: N/A         HH Arranged: PT, OT HH Agency: [facility] Date [facility] Agency Contacted: 01/27/24      Social Drivers of Health (SDOH) Interventions SDOH Screenings   Food Insecurity: No Food Insecurity (01/25/2024)  Housing: Low Risk  (01/26/2024)  Transportation Needs: No Transportation Needs (01/25/2024)  Utilities: Not At Risk (01/25/2024)  Alcohol Screen: Low Risk  (03/28/2022)  Financial Resource Strain: Low Risk  (03/28/2022)  Social Connections: Patient Unable To Answer (01/26/2024)  Tobacco Use: Low Risk  (01/25/2024)     Readmission Risk Interventions    10/12/2023    1:15 PM  Readmission Risk Prevention Plan  Transportation Screening Complete  PCP or Specialist Appt within 3-5 Days Complete  HRI or Home Care Consult Complete  Social Work  Consult for Recovery Care Planning/Counseling Complete  Palliative Care Screening Not Applicable  Medication Review [facility]) Complete

## 2024-01-28 NOTE — Discharge Summary (Signed)
 Physician Discharge Summary  Anne Roach:[id number] DOB: 11/13/1933 DOA: 01/25/2024  PCP: Anne Able, MD  Admit date: 01/25/2024 Discharge date: 01/28/2024 Discharging to: home Recommendations for Outpatient Follow-up:  F/u on pleural effusion    Discharge Diagnoses:   Principal Problem:   Acute respiratory failure with hypoxia [facility]) Active Problems:   Atrial fibrillation with RVR [facility])   Brief hospital course: This is an 89 year old female with HFpEF, atrial fibrillation, diabetes mellitus, esophageal dysmotility, hypertension who was admitted to the hospital for hypoxia.  The patient initially presented because of generalized weakness for the past couple of days.  In the ED she was found to be hypoxic.  Of note the patient was hospitalized in February with a left-sided pleural effusion and possible aspiration pneumonia.  She underwent a thoracentesis at that time and had 1.4 L of fluid removed which was felt to be an exudate. In the ED, UA was suggestive of UTI.  Chest x-ray revealed a left-sided pleural effusion and mild right sided infiltrate.  She was started on antibiotics for treatment of pneumonia and a UTI   Subjective:  No complaints of cough or shortness of breath.  She has pain in her legs which is chronic.     Assessment and Plan: Principal Problem:   Acute respiratory failure with hypoxia-left-sided pleural effusion History of HFrEF-EF of 35 to 40% -Pulse ox today is in the 90s on room air at rest and with ambulation - Currently receiving IV Lasix 20 mg-the patient states that she was recommended to stop taking her Lasix when she was last discharged from the hospital -Can continue Lasix but will hold off on a thoracentesis as she is essentially asymptomatic at this time -Does not appear to have pneumonia-DC azithromycin   Active Problems:   Possible urinary tract infection - asymptomatic- stop antibiotics   History of esophageal dysmotility - Appears that  she takes Reglan as outpatient-will resume   Hypertension - Amlodipine   Gout - Allopurinol   Atrial fibrillation with RVR (HCC) -Apixaban and metoprolol            Discharge Instructions  Discharge Instructions     Diet - low sodium heart healthy   Complete by: As directed    Diet Carb Modified   Complete by: As directed    Increase activity slowly   Complete by: As directed       Allergies as of 01/28/2024   No Known Allergies      Medication List     TAKE these medications    Accu-Chek Aviva Plus test strip Generic drug: glucose blood   acetaminophen 500 MG tablet Commonly known as: TYLENOL Take 1,000 mg by mouth every 6 (six) hours as needed for mild pain or headache.   allopurinol 100 MG tablet Commonly known as: ZYLOPRIM TAKE 1 TABLET(100 MG) BY MOUTH DAILY What changed: See the new instructions.   amLODipine 2.5 MG tablet Commonly known as: NORVASC Take 1 tablet (2.5 mg total) by mouth daily.   diclofenac Sodium 1 % Gel Commonly known as: VOLTAREN Apply 4 g topically 4 (four) times daily.   Eliquis 5 MG Tabs tablet Generic drug: apixaban TAKE 1 TABLET(5 MG) BY MOUTH TWICE DAILY What changed: See the new instructions.   fluticasone 50 MCG/ACT nasal spray Commonly known as: FLONASE Place 2 sprays into both nostrils daily.   folic acid 800 MCG tablet Commonly known as: FOLVITE Take 800 mcg by mouth daily.   furosemide 20 MG tablet  Commonly known as: LASIX Take 1 tablet (20 mg total) by mouth daily.   Fusion Plus Caps Take 1 capsule by mouth every other day.   gabapentin 400 MG capsule Commonly known as: NEURONTIN Take 400 mg by mouth 3 (three) times daily.   ICAPS AREDS 2 PO Take 1 capsule by mouth 2 (two) times daily.   Jardiance 10 MG Tabs tablet Generic drug: empagliflozin TAKE 1 TABLET(10 MG) BY MOUTH DAILY   loratadine 10 MG tablet Commonly known as: CLARITIN Take 10 mg by mouth daily as needed for allergies.    metoCLOPramide 10 MG tablet Commonly known as: REGLAN Take 10 mg by mouth 3 (three) times daily.   metoprolol succinate 50 MG 24 hr tablet Commonly known as: Toprol XL Take 1 tablet (50 mg total) by mouth daily. Take with or immediately following a meal.   ondansetron 4 MG disintegrating tablet Commonly known as: ZOFRAN-ODT Take 4 mg by mouth every 8 (eight) hours as needed for nausea or vomiting.   pantoprazole 40 MG tablet Commonly known as: PROTONIX Take 40 mg by mouth daily.   Potassium Chloride ER 20 MEQ Tbcr Take 20 mEq by mouth daily.   Vitamin D 125 MCG (5000 UT) Caps Take 5,000 Units by mouth daily with breakfast.            The results of significant diagnostics from this hospitalization (including imaging, microbiology, ancillary and laboratory) are listed below for reference.    DG Chest 2 View Result Date: 01/25/2024 CLINICAL DATA:  Generalized weakness EXAM: CHEST - 2 VIEW COMPARISON:  Chest x-ray 12/04/2023 FINDINGS: There is a large left pleural effusion, unchanged. Minimal patchy opacities are seen in the right infrahilar region, unchanged. Cardiomediastinal silhouette is stable. There is no pneumothorax or acute fracture. IMPRESSION: 1. Stable large left pleural effusion. 2. Stable minimal patchy opacities in the right infrahilar region. Electronically Signed   By: Darliss Cheney M.D.   On: 01/25/2024 18:47   Labs:   Basic Metabolic Panel: Recent Labs  Lab 01/25/24 1503 01/26/24 0529 01/27/24 0550  NA 138 138 138  K 4.3 3.9 3.9  CL 102 103 100  CO2 27 26 26   GLUCOSE 101* 97 98  BUN 12 13 16   CREATININE 0.98 0.93 1.11*  CALCIUM 8.9 8.9 8.7*  MG  --  2.2  --   PHOS  --  3.4  --      CBC: Recent Labs  Lab 01/25/24 1503 01/26/24 0529 01/27/24 0550  WBC 6.6 6.5 6.7  NEUTROABS 3.9 3.1  --   HGB 13.0 12.8 12.0  HCT 40.6 42.7 38.8  MCV 93.5 98.4 97.0  PLT 199 203 177         SIGNED:   Calvert Cantor, MD  [facility] 01/28/2024,  10:04 AM

## 2024-01-30 LAB — CULTURE, BLOOD (ROUTINE X 2)
Culture: NO GROWTH
Culture: NO GROWTH

## 2024-02-17 ENCOUNTER — Ambulatory Visit (INDEPENDENT_AMBULATORY_CARE_PROVIDER_SITE_OTHER): Payer: Medicare HMO | Admitting: Otolaryngology

## 2024-02-17 VITALS — BP 92/54 | HR 71 | Ht 65.0 in | Wt 150.0 lb

## 2024-02-17 DIAGNOSIS — J310 Chronic rhinitis: Secondary | ICD-10-CM | POA: Diagnosis not present

## 2024-02-17 DIAGNOSIS — R0981 Nasal congestion: Secondary | ICD-10-CM

## 2024-02-17 DIAGNOSIS — H903 Sensorineural hearing loss, bilateral: Secondary | ICD-10-CM

## 2024-02-17 DIAGNOSIS — R0982 Postnasal drip: Secondary | ICD-10-CM

## 2024-02-17 MED ORDER — IPRATROPIUM BROMIDE 0.06 % NA SOLN
2.0000 | Freq: Two times a day (BID) | NASAL | 12 refills | Status: AC | PRN
Start: 2024-02-17 — End: 2024-04-27

## 2024-02-17 NOTE — Progress Notes (Signed)
  Patient ID: Anne Roach, female   DOB: 09/07/1933, 89 y.o.   MRN: [id number]  Follow-up: Hearing loss New complaint: Chronic postnasal drainage  HPI: The patient is an 89 year old female who returns today for her follow-up evaluation.  The patient was last seen 1 year ago for her bilateral sensorineural hearing loss.  She was fitted with bilateral hearing aids.  However, she does not use the hearing aids regularly.  The patient returns today reporting no significant change in her hearing.  She denies any otalgia, otorrhea, or vertigo.  She has a new complaint today of chronic postnasal drainage.  She denies any recent sinusitis, facial pain, fever, or visual change.  She is not on any nasal medication at this time.  Exam: General: Communicates without difficulty, well nourished, no acute distress. Head: Normocephalic, no evidence injury, no tenderness, facial buttresses intact without stepoff. Face/sinus: No tenderness to palpation and percussion. Facial movement is normal and symmetric. Eyes: PERRL, EOMI. No scleral icterus, conjunctivae clear. Neuro: CN II exam reveals vision grossly intact.  No nystagmus at any point of gaze. Ears: Auricles well formed without lesions.  Ear canals are intact without mass or lesion.  No erythema or edema is appreciated.  The TMs are intact without fluid. Nose: External evaluation reveals normal support and skin without lesions.  Dorsum is intact.  Anterior rhinoscopy reveals congested mucosa over anterior aspect of inferior turbinates and intact septum.  No purulence noted. Oral:  Oral cavity and oropharynx are intact, symmetric, without erythema or edema.  Mucosa is moist without lesions. Neck: Full range of motion without pain.  There is no significant lymphadenopathy.  No masses palpable.  Thyroid bed within normal limits to palpation.  Parotid glands and submandibular glands equal bilaterally without mass.  Trachea is midline. Neuro:  CN 2-12 grossly intact.    Assessment: 1.  Chronic rhinitis with nasal mucosal congestion and chronic postnasal drainage. 2.  Subjectively stable bilateral actively sensorineural hearing loss.  Plan: 1.  The physical exam findings are reviewed with the patient and her daughter. 2.  She is encouraged to use her hearing aids regularly. 3.  Atrovent nasal spray to treat the postnasal drainage. 4.  The patient will return for reevaluation in 1 year, sooner if needed.

## 2024-02-19 ENCOUNTER — Other Ambulatory Visit: Payer: Self-pay | Admitting: Cardiovascular Disease

## 2024-02-19 NOTE — Telephone Encounter (Signed)
 Prescription refill request for Eliquis received. Indication:afib Last office visit:1/25 Scr:1.11  3/25 Age: 89 Weight:68  kg  Prescription refilled

## 2024-02-20 DIAGNOSIS — R0982 Postnasal drip: Secondary | ICD-10-CM | POA: Insufficient documentation

## 2024-02-20 DIAGNOSIS — H903 Sensorineural hearing loss, bilateral: Secondary | ICD-10-CM | POA: Insufficient documentation

## 2024-03-07 ENCOUNTER — Ambulatory Visit (INDEPENDENT_AMBULATORY_CARE_PROVIDER_SITE_OTHER): Admitting: Podiatry

## 2024-03-07 ENCOUNTER — Encounter: Payer: Self-pay | Admitting: Podiatry

## 2024-03-07 ENCOUNTER — Ambulatory Visit (INDEPENDENT_AMBULATORY_CARE_PROVIDER_SITE_OTHER)

## 2024-03-07 VITALS — Ht 65.0 in | Wt 150.0 lb

## 2024-03-07 DIAGNOSIS — M1A0710 Idiopathic chronic gout, right ankle and foot, without tophus (tophi): Secondary | ICD-10-CM

## 2024-03-07 DIAGNOSIS — M1A0720 Idiopathic chronic gout, left ankle and foot, without tophus (tophi): Secondary | ICD-10-CM | POA: Diagnosis not present

## 2024-03-07 DIAGNOSIS — M7741 Metatarsalgia, right foot: Secondary | ICD-10-CM

## 2024-03-07 DIAGNOSIS — M7742 Metatarsalgia, left foot: Secondary | ICD-10-CM | POA: Diagnosis not present

## 2024-03-07 DIAGNOSIS — M722 Plantar fascial fibromatosis: Secondary | ICD-10-CM | POA: Diagnosis not present

## 2024-03-07 MED ORDER — BETAMETHASONE SOD PHOS & ACET 6 (3-3) MG/ML IJ SUSP
3.0000 mg | Freq: Once | INTRAMUSCULAR | Status: AC
  Administered 2024-03-07: 3 mg via INTRA_ARTICULAR

## 2024-03-07 NOTE — Progress Notes (Signed)
 Chief Complaint  Patient presents with   Gout    Patient is here for bilateral gout issues    Subjective:  89 y.o. female presenting today with her daughter for evaluation of recurrence of pain and tenderness associated to the plantar aspect of bilateral feet.  She says that it hurts to walk long distances.  No history of injury.  Currently she takes tramadol  and gabapentin  for the pain.  She presents for further treatment and evaluation   Past Medical History:  Diagnosis Date   A-fib [facility])    Acute on chronic diastolic CHF (congestive heart failure) (HCC) 03/27/2022   Anemia    Arthritis    Atrial fibrillation (HCC)    Atrial fibrillation with RVR (HCC) 03/20/2017   Cancer (HCC)    colon cancer   CAP (community acquired pneumonia) 01/25/2024   Chronic diastolic CHF (congestive heart failure) (HCC) 10/31/2020   DM (diabetes mellitus) (HCC)    HTN (hypertension)    Occasional tremors    on Inderal  for this   Sepsis secondary to UTI (HCC) 07/11/2020   Sleep apnea    not used cpap in a long time per daughter   Tremor    Past Surgical History:  Procedure Laterality Date   APPENDECTOMY     BIOPSY  12/23/2022   Procedure: BIOPSY;  Surgeon: Genell Ken, MD;  Location: [facility];  Service: Gastroenterology;;   BIOPSY  08/03/2023   Procedure: BIOPSY;  Surgeon: Genell Ken, MD;  Location: [facility];  Service: Gastroenterology;;   CARPAL TUNNEL RELEASE     CATARACT EXTRACTION Bilateral    COLONOSCOPY WITH PROPOFOL  N/A 08/03/2023   Procedure: COLONOSCOPY WITH PROPOFOL ;  Surgeon: Genell Ken, MD;  Location: [facility];  Service: Gastroenterology;  Laterality: N/A;   ESOPHAGEAL DILATION  12/23/2022   Procedure: ESOPHAGEAL DILATION;  Surgeon: Genell Ken, MD;  Location: [facility];  Service: Gastroenterology;;   ESOPHAGOGASTRODUODENOSCOPY (EGD) WITH PROPOFOL  N/A 12/23/2022   Procedure: ESOPHAGOGASTRODUODENOSCOPY (EGD) WITH PROPOFOL  W/ DILATION;  Surgeon: Genell Ken, MD;   Location: [facility];  Service: Gastroenterology;  Laterality: N/A;  with dilation   HERNIA REPAIR     LAPAROSCOPIC RIGHT HEMI COLECTOMY Right 10/08/2023   Procedure: LAPAROSCOPIC RIGHT HEMICOLECTOMY;  Surgeon: Melvenia Stabs, MD;  Location: [facility];  Service: General;  Laterality: Right;   POLYPECTOMY  08/03/2023   Procedure: POLYPECTOMY;  Surgeon: Genell Ken, MD;  Location: [facility];  Service: Gastroenterology;;   tarsal tunnel surgery      No Known Allergies   Objective / Physical Exam:  General:  The patient is alert and oriented x3 in no acute distress. Dermatology:  Skin is warm, dry and supple bilateral lower extremities. Negative for open lesions or macerations. Vascular:  Palpable pedal pulses bilaterally. No erythema noted.  Pitting edema noted to the bilateral feet and ankles.  The edema does not extend up into the leg or calf.  Capillary refill within normal limits. Neurological:  Grossly intact via light touch Musculoskeletal Exam:  Today there is pain on palpation noted to the plantar heels bilateral as well as the bilateral forefoot.  There is tenderness specifically to the second third MTP and ball of the foot diffusely consistent with a global metatarsalgia Radiographic exam B/L feet 03/07/2024: No acute fracture identified.  Moderate joint space narrowing consistent with diffuse osteoarthritis throughout the feet   Assessment: 1.  Metatarsalgia bilateral feet 2.  Plantar fasciitis bilateral  Plan of Care:  -Patient evaluated.  X-rays reviewed -Injection  of 0.5 cc Celestone  Soluspan injected into the bilateral plantar fascia -OTC power step insoles were dispensed to support the medial longitudinal arch of the foot and offload pressure from the heel as well as the forefoot -Patient is currently on medication from her PCP.  No additional medications prescribed -Return to clinic as needed  Dot Gazella, DPM [facility]  Dr. Dot Gazella, DPM    [address] [place], [place] [address]                Office [phone]  Fax [phone]

## 2024-03-22 NOTE — Progress Notes (Unsigned)
 Primary Care: Dr Phil Braun Primary Cardiologist:Dr Stann Earnest   HPI:    Anne Roach is a 89 y.o. female with hypertension, diabetes, permanent atrial fibrillation, and HFpEF.   Admitted 03/27/2022 for acute on chronic diastolic heart failure. Of note, this is her third ER visit since 03/27/22 for dyspnea. Diuresed with IV lasix  and transitioned to lasix  as needed. Discharged on 04/02/22. D/C weight 171 pounds.    Had initial TOC clinic f/u on 5/24. Gradually feeling worse since discharge. SOB with exertion. + Orthopnea. Noted to be fluid overloaded and prescribed FUROSCIX  80 mg x 1 w/ instruction to transition back to PO Lasix  on 5/25. Frequency of Lasix  change from PRN to daily.   ReDs clip 35% on 04/17/22 . Denies resting dyspnea but c/w exertional dyspnea. NYHA Class III. SOB ambulating around her home. Denies orthopnea/PND. BP 110/68. Seen by CHF PA and recommended Entresto but daughter deferred fearing side effects 04/17/22 K 3.8 Cr 1.17 BUN 17 BNP improved from 1111 to 472   Having some dysphagia to solids Needs PT/OT at home Otherwise doing well  Lots of family in [place] still and it has been difficutl   She had partial colectomy for cancer 10/08/23   Seen by Dr Tita Form 12/04/23 at [facility] for chest pain.  R/O no acute ECG changes Atypical sharp pain. Anxiety at night when trying to sleep Normal myovue 2012 CT chest 07/02/23 minimal coronary calcium for age. On beta blocker for afib rate control. He arranged outpatient lexiscan  myovue study done 12/09/23 was normal perfusion with no ischemia EF estimated at 31% but likely not accurate due to afib. F/U echo ordered but not done yet Last TTE done 03/2022 EF was 50-55%   She seems to be weak. Afib rates better on lopressor  rather than inderal  which she had been on for tremors      Cardiac Testing  Echo 03/2022 -EF 50-55%  RV normal  LA severely dilated. RA moderately dilated. Small pericardiac effusion.  Echo 10/2020- EF 50-55% RV moderately reduced. LA severely  dilated RA mildly dilated.   Review of Systems: [y] = yes, [ ]  = no   General: Weight gain [ ] ; Weight loss [ ] ; Anorexia [ ] ; Fatigue [ ] ; Fever [ ] ; Chills [ ] ; Weakness [ ]   Cardiac: Chest pain/pressure [ ] ; Resting SOB [ ] ; Exertional SOB [ Y]; Orthopnea [ ] ; Pedal Edema [ ] ; Palpitations [ ] ; Syncope [ ] ; Presyncope [ ] ; Paroxysmal nocturnal dyspnea[ ]   Pulmonary: Cough [ ] ; Wheezing[ ] ; Hemoptysis[ ] ; Sputum [ ] ; Snoring [ ]   GI: Vomiting[ ] ; Dysphagia[ ] ; Melena[ ] ; Hematochezia [ ] ; Heartburn[ ] ; Abdominal pain [ ] ; Constipation [ ] ; Diarrhea [ ] ; BRBPR [ ]   GU: Hematuria[ ] ; Dysuria [ ] ; Nocturia[ ]   Vascular: Pain in legs with walking [ ] ; Pain in feet with lying flat [ ] ; Non-healing sores [ ] ; Stroke [ ] ; TIA [ ] ; Slurred speech [ ] ;  Neuro: Headaches[ ] ; Vertigo[ ] ; Seizures[ ] ; Paresthesias[ ] ;Blurred vision [ ] ; Diplopia [ ] ; Vision changes [ ]   Ortho/Skin: Arthritis [ ] ; Joint pain [Y ]; Muscle pain [ ] ; Joint swelling [ ] ; Back Pain [ Y]; Rash [ ]   Psych: Depression[ ] ; Anxiety[ ]   Heme: Bleeding problems [ ] ; Clotting disorders [ ] ; Anemia [ ]   Endocrine: Diabetes [ Y]; Thyroid  dysfunction[ ]    Past Medical History:  Diagnosis Date   A-fib (HCC)    Acute on chronic diastolic CHF (  congestive heart failure) (HCC) 03/27/2022   Anemia    Arthritis    Atrial fibrillation (HCC)    Atrial fibrillation with RVR (HCC) 03/20/2017   Cancer (HCC)    colon cancer   CAP (community acquired pneumonia) 01/25/2024   Chronic diastolic CHF (congestive heart failure) (HCC) 10/31/2020   DM (diabetes mellitus) (HCC)    HTN (hypertension)    Occasional tremors    on Inderal  for this   Sepsis secondary to UTI (HCC) 07/11/2020   Sleep apnea    not used cpap in a long time per daughter   Tremor     Current Outpatient Medications  Medication Sig Dispense Refill   ACCU-CHEK AVIVA PLUS test strip      acetaminophen  (TYLENOL ) 500 MG tablet Take 1,000 mg by mouth every 6 (six) hours as  needed for mild pain or headache.     allopurinol  (ZYLOPRIM ) 100 MG tablet TAKE 1 TABLET(100 MG) BY MOUTH DAILY (Patient taking differently: Take 100 mg by mouth daily.) 90 tablet 2   Cholecalciferol  (VITAMIN D ) 125 MCG (5000 UT) CAPS Take 5,000 Units by mouth daily with breakfast.     diclofenac  Sodium (VOLTAREN ) 1 % GEL Apply 4 g topically 4 (four) times daily. 100 g 0   ELIQUIS  5 MG TABS tablet TAKE 1 TABLET(5 MG) BY MOUTH TWICE DAILY 60 tablet 5   empagliflozin  (JARDIANCE ) 10 MG TABS tablet TAKE 1 TABLET(10 MG) BY MOUTH DAILY 90 tablet 3   folic acid  (FOLVITE ) 800 MCG tablet Take 800 mcg by mouth daily.     furosemide  (LASIX ) 20 MG tablet Take 1 tablet (20 mg total) by mouth daily. 90 tablet 3   gabapentin  (NEURONTIN ) 400 MG capsule Take 400 mg by mouth 3 (three) times daily.     Iron-FA-B Cmp-C-Biot-Probiotic (FUSION PLUS) CAPS Take 1 capsule by mouth every other day.     metoprolol  succinate (TOPROL  XL) 50 MG 24 hr tablet Take 1 tablet (50 mg total) by mouth daily. Take with or immediately following a meal. 90 tablet 3   Multiple Vitamins-Minerals (ICAPS AREDS 2 PO) Take 1 capsule by mouth 2 (two) times daily.     ondansetron  (ZOFRAN -ODT) 4 MG disintegrating tablet Take 4 mg by mouth every 8 (eight) hours as needed for nausea or vomiting.     pantoprazole  (PROTONIX ) 40 MG tablet Take 40 mg by mouth daily.     Potassium Chloride  ER 20 MEQ TBCR Take 20 mEq by mouth daily. 60 tablet    amLODipine  (NORVASC ) 2.5 MG tablet Take 1 tablet (2.5 mg total) by mouth daily.     ipratropium (ATROVENT ) 0.06 % nasal spray Place 2 sprays into both nostrils 2 (two) times daily as needed (nasal drainage). 15 mL 12   loratadine  (CLARITIN ) 10 MG tablet Take 10 mg by mouth daily as needed for allergies. (Patient not taking: Reported on 03/23/2024)     No current facility-administered medications for this visit.    No Known Allergies    Social History   Socioeconomic History   Marital status: Widowed     Spouse name: Not on file   Number of children: 2   Years of education: 31   Highest education level: High school graduate  Occupational History    Comment: Retired    Comment: retired  Tobacco Use   Smoking status: Never    Passive exposure: Never   Smokeless tobacco: Never   Tobacco comments:    Never smoked 10/29/23  Vaping Use  Vaping status: Never Used  Substance and Sexual Activity   Alcohol use: No    Alcohol/week: 0.0 standard drinks of alcohol   Drug use: No   Sexual activity: Not on file  Other Topics Concern   Not on file  Social History Narrative   Speaks English and Arabic. Patient lives at home with her daughter Sheilah Denver). Patient is widowed.   Patient is retired.   Education high school.   Right handed.   Two children.   Caffeine None   Social Drivers of Corporate investment banker Strain: Low Risk  (03/28/2022)   Overall Financial Resource Strain (CARDIA)    Difficulty of Paying Living Expenses: Not hard at all  Food Insecurity: No Food Insecurity (01/25/2024)   Hunger Vital Sign    Worried About Running Out of Food in the Last Year: Never true    Ran Out of Food in the Last Year: Never true  Transportation Needs: No Transportation Needs (01/25/2024)   PRAPARE - Administrator, Civil Service (Medical): No    Lack of Transportation (Non-Medical): No  Physical Activity: Not on file  Stress: Not on file  Social Connections: Patient Unable To Answer (01/26/2024)   Social Connection and Isolation Panel [NHANES]    Frequency of Communication with Friends and Family: Patient unable to answer    Frequency of Social Gatherings with Friends and Family: Patient unable to answer    Attends Religious Services: Patient unable to answer    Active Member of Clubs or Organizations: Patient unable to answer    Attends Banker Meetings: Patient unable to answer    Marital Status: Patient unable to answer  Intimate Partner Violence: Not At Risk  (01/25/2024)   Humiliation, Afraid, Rape, and Kick questionnaire    Fear of Current or Ex-Partner: No    Emotionally Abused: No    Physically Abused: No    Sexually Abused: No      Family History  Problem Relation Age of Onset   Diabetes Father    Asthma Brother    Cancer Daughter 1       BREAST CANCER    Vitals:   03/23/24 0828  BP: 92/60  Pulse: 75  SpO2: 97%  Weight: 144 lb 9.6 oz (65.6 kg)  Height: 5\' 5"  (1.651 m)      Wt Readings from Last 3 Encounters:  03/23/24 144 lb 9.6 oz (65.6 kg)  03/07/24 150 lb (68 kg)  02/17/24 150 lb (68 kg)    PHYSICAL EXAM:  Affect appropriate Healthy:  appears stated age HEENT: normal Neck supple with no adenopathy JVP normal no bruits no thyromegaly Lungs clear with no wheezing and good diaphragmatic motion Heart:  S1/S2 no murmur, no rub, gallop or click PMI normal Abdomen: no post colectomy healed well  no bruit.  No HSM or HJR Distal pulses intact with no bruits No edema Neuro non-focal Skin warm and dry No muscular weakness  ECG:  04/09/22 Afib rate 74 nonspecific ST changes   ASSESSMENT & PLAN:  1. HFpEF  - Echo 5/23 EF 50-55% RV normal  -  Grossly euvolemic on exam Discussed Entresto again with daughter and will forego for now  - BNP improved Lasix  40 mg daily   - continue Jardiance  10 mg daily  - continue Toprol  XL 50 mg daily  - advised low sodium diet and fluid restriction + daily wts - updated TTE pending    2. Chronic A fib  -  Rate controlled Toprol  50 mg daily  -On eliquis  5 mg twice a day - Cr 1.2 K 3.9 weight > 60 kg 5 mg bid appropriate dose   3. H/O Hyperkalemia  - K 3.8 04/17/22   TTE for EF    Follow up in a year   Janelle Mediate  MD [facility]

## 2024-03-23 ENCOUNTER — Ambulatory Visit: Payer: Medicare HMO | Attending: Cardiovascular Disease | Admitting: Cardiovascular Disease

## 2024-03-23 ENCOUNTER — Encounter: Payer: Self-pay | Admitting: Cardiovascular Disease

## 2024-03-23 VITALS — BP 92/60 | HR 75 | Ht 65.0 in | Wt 144.6 lb

## 2024-03-23 DIAGNOSIS — I5033 Acute on chronic diastolic (congestive) heart failure: Secondary | ICD-10-CM | POA: Diagnosis not present

## 2024-03-23 DIAGNOSIS — R931 Abnormal findings on diagnostic imaging of heart and coronary circulation: Secondary | ICD-10-CM | POA: Diagnosis not present

## 2024-03-23 DIAGNOSIS — I5032 Chronic diastolic (congestive) heart failure: Secondary | ICD-10-CM

## 2024-03-23 NOTE — Patient Instructions (Addendum)
 Medication Instructions:  Your physician recommends that you continue on your current medications as directed. Please refer to the Current Medication list given to you today.  *If you need a refill on your cardiac medications before your next appointment, please call your pharmacy*  Lab Work: If you have labs (blood work) drawn today and your tests are completely normal, you will receive your results only by: MyChart Message (if you have MyChart) OR A paper copy in the mail If you have any lab test that is abnormal or we need to change your treatment, we will call you to review the results.  Testing/Procedures: Your physician has requested that you have an echocardiogram. Echocardiography is a painless test that uses sound waves to create images of your heart. It provides your doctor with information about the size and shape of your heart and how well your heart's chambers and valves are working. This procedure takes approximately one hour. There are no restrictions for this procedure. Please do NOT wear cologne, perfume, aftershave, or lotions (deodorant is allowed). Please arrive 15 minutes prior to your appointment time.  Please note: We ask at that you not bring children with you during ultrasound (echo/ vascular) testing. Due to room size and safety concerns, children are not allowed in the ultrasound rooms during exams. Our front office staff cannot provide observation of children in our lobby area while testing is being conducted. An adult accompanying a patient to their appointment will only be allowed in the ultrasound room at the discretion of the ultrasound technician under special circumstances. We apologize for any inconvenience. Follow-Up: At [facility], you and your health needs are our priority.  As part of our continuing mission to provide you with exceptional heart care, our providers are all part of one team.  This team includes your primary Cardiologist (physician)  and Advanced Practice Providers or APPs (Physician Assistants and Nurse Practitioners) who all work together to provide you with the care you need, when you need it.  Your next appointment:   12 month(s)  Provider:   Janelle Mediate, MD    We recommend signing up for the patient portal called "MyChart".  Sign up information is provided on this After Visit Summary.  MyChart is used to connect with patients for Virtual Visits (Telemedicine).  Patients are able to view lab/test results, encounter notes, upcoming appointments, etc.  Non-urgent messages can be sent to your provider as well.   To learn more about what you can do with MyChart, go to [email or web address].

## 2024-03-25 ENCOUNTER — Ambulatory Visit: Payer: Medicare HMO | Admitting: Cardiovascular Disease

## 2024-04-26 ENCOUNTER — Ambulatory Visit: Payer: Self-pay | Admitting: Cardiology

## 2024-04-26 ENCOUNTER — Ambulatory Visit (HOSPITAL_COMMUNITY)
Admission: RE | Admit: 2024-04-26 | Discharge: 2024-04-26 | Disposition: A | Discharge status: Home / Self Care | Source: Ambulatory Visit | Attending: Cardiovascular Disease | Admitting: Cardiovascular Disease

## 2024-04-26 DIAGNOSIS — R931 Abnormal findings on diagnostic imaging of heart and coronary circulation: Secondary | ICD-10-CM

## 2024-04-26 DIAGNOSIS — I5033 Acute on chronic diastolic (congestive) heart failure: Secondary | ICD-10-CM

## 2024-04-26 LAB — ECHOCARDIOGRAM COMPLETE: S' Lateral: 3.4 cm

## 2024-04-26 NOTE — Progress Notes (Signed)
 I sent her to ER from the outpatient echo lab. Sent you a secure chat earlier today.   Anne Roach.

## 2024-04-27 ENCOUNTER — Encounter (HOSPITAL_COMMUNITY): Payer: Self-pay

## 2024-04-27 ENCOUNTER — Emergency Department (HOSPITAL_COMMUNITY)

## 2024-04-27 ENCOUNTER — Inpatient Hospital Stay (HOSPITAL_COMMUNITY)
Admission: EM | Admit: 2024-04-27 | Discharge: 2024-05-03 | DRG: 291 | Disposition: A | Discharge status: Home / Self Care | Attending: Infectious Diseases | Admitting: Infectious Diseases

## 2024-04-27 ENCOUNTER — Other Ambulatory Visit: Payer: Self-pay

## 2024-04-27 DIAGNOSIS — R296 Repeated falls: Secondary | ICD-10-CM | POA: Diagnosis present

## 2024-04-27 DIAGNOSIS — Z91199 Patient's noncompliance with other medical treatment and regimen due to unspecified reason: Secondary | ICD-10-CM

## 2024-04-27 DIAGNOSIS — D649 Anemia, unspecified: Secondary | ICD-10-CM | POA: Diagnosis present

## 2024-04-27 DIAGNOSIS — T502X5A Adverse effect of carbonic-anhydrase inhibitors, benzothiadiazides and other diuretics, initial encounter: Secondary | ICD-10-CM | POA: Diagnosis not present

## 2024-04-27 DIAGNOSIS — Z9049 Acquired absence of other specified parts of digestive tract: Secondary | ICD-10-CM

## 2024-04-27 DIAGNOSIS — I5022 Chronic systolic (congestive) heart failure: Secondary | ICD-10-CM

## 2024-04-27 DIAGNOSIS — Z833 Family history of diabetes mellitus: Secondary | ICD-10-CM

## 2024-04-27 DIAGNOSIS — R112 Nausea with vomiting, unspecified: Secondary | ICD-10-CM | POA: Diagnosis present

## 2024-04-27 DIAGNOSIS — J90 Pleural effusion, not elsewhere classified: Secondary | ICD-10-CM | POA: Diagnosis not present

## 2024-04-27 DIAGNOSIS — I130 Hypertensive heart and chronic kidney disease with heart failure and stage 1 through stage 4 chronic kidney disease, or unspecified chronic kidney disease: Secondary | ICD-10-CM | POA: Diagnosis present

## 2024-04-27 DIAGNOSIS — R0602 Shortness of breath: Secondary | ICD-10-CM | POA: Diagnosis not present

## 2024-04-27 DIAGNOSIS — I482 Chronic atrial fibrillation, unspecified: Secondary | ICD-10-CM | POA: Diagnosis present

## 2024-04-27 DIAGNOSIS — N183 Chronic kidney disease, stage 3 unspecified: Secondary | ICD-10-CM | POA: Diagnosis present

## 2024-04-27 DIAGNOSIS — I272 Pulmonary hypertension, unspecified: Secondary | ICD-10-CM | POA: Diagnosis present

## 2024-04-27 DIAGNOSIS — I5082 Biventricular heart failure: Secondary | ICD-10-CM | POA: Diagnosis present

## 2024-04-27 DIAGNOSIS — Z803 Family history of malignant neoplasm of breast: Secondary | ICD-10-CM

## 2024-04-27 DIAGNOSIS — I5021 Acute systolic (congestive) heart failure: Secondary | ICD-10-CM | POA: Diagnosis not present

## 2024-04-27 DIAGNOSIS — K224 Dyskinesia of esophagus: Secondary | ICD-10-CM | POA: Diagnosis present

## 2024-04-27 DIAGNOSIS — J9621 Acute and chronic respiratory failure with hypoxia: Secondary | ICD-10-CM | POA: Diagnosis present

## 2024-04-27 DIAGNOSIS — W19XXXA Unspecified fall, initial encounter: Secondary | ICD-10-CM | POA: Diagnosis present

## 2024-04-27 DIAGNOSIS — Z8701 Personal history of pneumonia (recurrent): Secondary | ICD-10-CM

## 2024-04-27 DIAGNOSIS — E860 Dehydration: Secondary | ICD-10-CM | POA: Diagnosis present

## 2024-04-27 DIAGNOSIS — J918 Pleural effusion in other conditions classified elsewhere: Secondary | ICD-10-CM | POA: Diagnosis present

## 2024-04-27 DIAGNOSIS — I5043 Acute on chronic combined systolic (congestive) and diastolic (congestive) heart failure: Secondary | ICD-10-CM | POA: Diagnosis not present

## 2024-04-27 DIAGNOSIS — Z7901 Long term (current) use of anticoagulants: Secondary | ICD-10-CM

## 2024-04-27 DIAGNOSIS — N1832 Chronic kidney disease, stage 3b: Secondary | ICD-10-CM | POA: Diagnosis present

## 2024-04-27 DIAGNOSIS — Z79899 Other long term (current) drug therapy: Secondary | ICD-10-CM

## 2024-04-27 DIAGNOSIS — E119 Type 2 diabetes mellitus without complications: Secondary | ICD-10-CM

## 2024-04-27 DIAGNOSIS — J9691 Respiratory failure, unspecified with hypoxia: Secondary | ICD-10-CM | POA: Diagnosis present

## 2024-04-27 DIAGNOSIS — I4821 Permanent atrial fibrillation: Secondary | ICD-10-CM | POA: Diagnosis present

## 2024-04-27 DIAGNOSIS — M109 Gout, unspecified: Secondary | ICD-10-CM | POA: Diagnosis present

## 2024-04-27 DIAGNOSIS — E1122 Type 2 diabetes mellitus with diabetic chronic kidney disease: Secondary | ICD-10-CM | POA: Diagnosis present

## 2024-04-27 DIAGNOSIS — Z85048 Personal history of other malignant neoplasm of rectum, rectosigmoid junction, and anus: Secondary | ICD-10-CM

## 2024-04-27 DIAGNOSIS — Z825 Family history of asthma and other chronic lower respiratory diseases: Secondary | ICD-10-CM

## 2024-04-27 DIAGNOSIS — Z7984 Long term (current) use of oral hypoglycemic drugs: Secondary | ICD-10-CM

## 2024-04-27 DIAGNOSIS — R000 Tachycardia, unspecified: Secondary | ICD-10-CM | POA: Diagnosis present

## 2024-04-27 DIAGNOSIS — I951 Orthostatic hypotension: Secondary | ICD-10-CM | POA: Diagnosis present

## 2024-04-27 DIAGNOSIS — G4733 Obstructive sleep apnea (adult) (pediatric): Secondary | ICD-10-CM | POA: Diagnosis present

## 2024-04-27 LAB — CBC WITH DIFFERENTIAL/PLATELET
Abs Immature Granulocytes: 0.02 10*3/uL (ref 0.00–0.07)
Basophils Absolute: 0 10*3/uL (ref 0.0–0.1)
Basophils Relative: 1 %
Eosinophils Absolute: 0.1 10*3/uL (ref 0.0–0.5)
Eosinophils Relative: 2 %
HCT: 43 % (ref 36.0–46.0)
Hemoglobin: 13.6 g/dL (ref 12.0–15.0)
Immature Granulocytes: 0 %
Lymphocytes Relative: 26 %
Lymphs Abs: 1.6 10*3/uL (ref 0.7–4.0)
MCH: 30.8 pg (ref 26.0–34.0)
MCHC: 31.6 g/dL (ref 30.0–36.0)
MCV: 97.5 fL (ref 80.0–100.0)
Monocytes Absolute: 0.5 10*3/uL (ref 0.1–1.0)
Monocytes Relative: 8 %
Neutro Abs: 3.9 10*3/uL (ref 1.7–7.7)
Neutrophils Relative %: 63 %
Platelets: 172 10*3/uL (ref 150–400)
RBC: 4.41 MIL/uL (ref 3.87–5.11)
RDW: 14.6 % (ref 11.5–15.5)
WBC: 6.2 10*3/uL (ref 4.0–10.5)
nRBC: 0 % (ref 0.0–0.2)

## 2024-04-27 LAB — COMPREHENSIVE METABOLIC PANEL WITH GFR
ALT: 10 U/L (ref 0–44)
AST: 18 U/L (ref 15–41)
Albumin: 3.4 g/dL — ABNORMAL LOW (ref 3.5–5.0)
Alkaline Phosphatase: 50 U/L (ref 38–126)
Anion gap: 12 (ref 5–15)
BUN: 17 mg/dL (ref 8–23)
CO2: 27 mmol/L (ref 22–32)
Calcium: 9 mg/dL (ref 8.9–10.3)
Chloride: 104 mmol/L (ref 98–111)
Creatinine, Ser: 1.25 mg/dL — ABNORMAL HIGH (ref 0.44–1.00)
GFR, Estimated: 41 mL/min — ABNORMAL LOW (ref >60)
Glucose, Bld: 149 mg/dL — ABNORMAL HIGH (ref 70–99)
Potassium: 4.3 mmol/L (ref 3.5–5.1)
Sodium: 143 mmol/L (ref 135–145)
Total Bilirubin: 0.7 mg/dL (ref 0.0–1.2)
Total Protein: 7.4 g/dL (ref 6.5–8.1)

## 2024-04-27 LAB — TROPONIN I (HIGH SENSITIVITY)
Troponin I (High Sensitivity): 16 ng/L (ref <18)
Troponin I (High Sensitivity): 16 ng/L (ref <18)

## 2024-04-27 LAB — BRAIN NATRIURETIC PEPTIDE: B Natriuretic Peptide: 219.8 pg/mL — ABNORMAL HIGH (ref 0.0–100.0)

## 2024-04-27 MED ORDER — EMPAGLIFLOZIN 10 MG PO TABS
10.0000 mg | ORAL_TABLET | Freq: Every day | ORAL | Status: DC
  Administered 2024-04-28 – 2024-05-02 (×5): 10 mg via ORAL
  Filled 2024-04-27 (×5): qty 1

## 2024-04-27 MED ORDER — AMLODIPINE BESYLATE 5 MG PO TABS: 2.5000 mg | ORAL_TABLET | Freq: Every day | ORAL | Status: DC

## 2024-04-27 MED ORDER — GABAPENTIN 400 MG PO CAPS
400.0000 mg | ORAL_CAPSULE | Freq: Three times a day (TID) | ORAL | Status: DC
  Administered 2024-04-27 – 2024-05-03 (×17): 400 mg via ORAL
  Filled 2024-04-27 (×8): qty 1
  Filled 2024-04-27: qty 4
  Filled 2024-04-27 (×8): qty 1

## 2024-04-27 MED ORDER — ACETAMINOPHEN 325 MG PO TABS
650.0000 mg | ORAL_TABLET | Freq: Once | ORAL | Status: AC
  Administered 2024-04-27: 650 mg via ORAL
  Filled 2024-04-27: qty 2

## 2024-04-27 MED ORDER — FUROSEMIDE 20 MG PO TABS
20.0000 mg | ORAL_TABLET | Freq: Every day | ORAL | Status: DC
  Administered 2024-04-28 – 2024-04-30 (×3): 20 mg via ORAL
  Filled 2024-04-27 (×3): qty 1

## 2024-04-27 MED ORDER — POTASSIUM CHLORIDE CRYS ER 10 MEQ PO TBCR: 20.0000 meq | EXTENDED_RELEASE_TABLET | Freq: Every day | ORAL | Status: DC

## 2024-04-27 MED ORDER — ALLOPURINOL 100 MG PO TABS
100.0000 mg | ORAL_TABLET | Freq: Every day | ORAL | Status: DC
  Administered 2024-04-28 – 2024-05-03 (×6): 100 mg via ORAL
  Filled 2024-04-27 (×6): qty 1

## 2024-04-27 MED ORDER — METOPROLOL SUCCINATE ER 50 MG PO TB24
50.0000 mg | ORAL_TABLET | Freq: Every day | ORAL | Status: DC
  Administered 2024-04-28 – 2024-04-30 (×3): 50 mg via ORAL
  Filled 2024-04-27: qty 1
  Filled 2024-04-27: qty 2
  Filled 2024-04-27 (×2): qty 1

## 2024-04-27 NOTE — ED Notes (Signed)
 Awaiting patient from lobby.

## 2024-04-27 NOTE — ED Provider Triage Note (Signed)
 Emergency Medicine Provider Triage Evaluation Note  Anne Roach , a 89 y.o. female  was evaluated in triage.  Pt complains of shob.  Has been going on for 3 weeks.  Did have an echo done yesterday.  Showed reduced ventricular function in comparison to prior study.  She notes some mild lower extremity edema bilaterally.  Denies chest pain.  Review of Systems  Positive: See above Negative: See above  Physical Exam  BP (!) 126/59 (BP Location: Right Arm)   Pulse 76   Temp 99 F (37.2 C)   Resp 20   Ht 5' 5 (1.651 m)   Wt 65.8 kg   SpO2 94%   BMI 24.13 kg/m  Gen:   Awake, no distress   Resp:  Normal effort  MSK:   Moves extremities without difficulty  Other:    Medical Decision Making  Medically screening exam initiated at 12:55 PM.  Appropriate orders placed.  KARRINA LYE was informed that the remainder of the evaluation will be completed by another provider, this initial triage assessment does not replace that evaluation, and the importance of remaining in the ED until their evaluation is complete.  Work up started   Janalee Mcmurray, PA-C 04/27/24 1258

## 2024-04-27 NOTE — ED Notes (Signed)
 Pt ambulated roughly 50 ft, 2-assist, O2 sats remained between 90-93%, HR 85-92 bpm

## 2024-04-27 NOTE — H&P (Signed)
 Date: 04/27/2024               Patient Name:  Anne Roach MRN: [id number]  DOB: 11/23/1932 Age / Sex: 89 y.o., female   PCP: Danella Dunn, MD         Medical Service: Internal Medicine Teaching Service         Attending Physician: Dr. Cherylene Corrente, MD      First Contact:   Cody Das, MS-3    Second Contact: Jose Ngo, MD        Pager:  [phone]  Third Contact: Adria Hopkins, MD    Pager:  [phone]       After Hours  (After 5pm / First Contact Pager: [phone]  weekends / holidays): Second Contact Pager: [phone]   SUBJECTIVE   Chief Complaint: SOB  History of Present Illness: GRADIE BUTRICK is a 89 y.o. female with PMH of chronic pleural effusions, chronic HFrEF, permanent Afib on Eliquis  and metoprolol , CKD III, esophogeal dysmotility, colorectal carcinoma s/p partial colectomy in 2024, and T2DM who was referred to the ED by her cardiologist after echo resulted in reduced LV ejection fraction of 40-45% and hypoxia with O2 sat of 80%.   The patient endorses gradual onset of shortness of breath that has lasted for about 2 months. SOB mostly occurs with exertion but does also express SOB while at rest. Denies orthopnea, PND, leg swelling. Also denies fevers, chills, or cough. Endorses lightheadedness and dizziness sometimes upon walking. No wheezes or history of smoking.   ED Course: In the ED, vitals were afebrile (37.2), normal rate (76), RR 20, low normotensive (126/59), O2 sat 94% on room air. Labs significant for BNP 220. Chest xray resulted in left pleural effusion.   Meds:  Acetaminophen  500 mg, 2 tablets every 6 hours PRN  Allopurinol  100 mg by mouth daily  Cholecalciferol  125 mcg by mouth daily  Eliquis  5 mg by mouth twice daily  Empagliflozin  10 mg by mouth daily  Folic acid  800 mcg by mouth daily Lasix  20 mg by mouth daily  Gabapentin  400 mg by mouth 3 times daily  Iron-FA-B Cmp-C-Biot-Probiotic by mouth every other day   Metoprolol  50 mg by mouth daily  Potassium 20 meq by mouth daily  Amlodipine  2.5 mg by mouth daily   Past Medical History Past Medical History:  Diagnosis Date   A-fib (HCC)    Acute on chronic diastolic CHF (congestive heart failure) (HCC) 03/27/2022   Anemia    Arthritis    Atrial fibrillation (HCC)    Atrial fibrillation with RVR (HCC) 03/20/2017   Cancer (HCC)    colon cancer   CAP (community acquired pneumonia) 01/25/2024   Chronic diastolic CHF (congestive heart failure) (HCC) 10/31/2020   DM (diabetes mellitus) (HCC)    HTN (hypertension)    Occasional tremors    on Inderal  for this   Sepsis secondary to UTI (HCC) 07/11/2020   Sleep apnea    not used cpap in a long time per daughter   Tremor   - Esophageal dysmotility  - Gout  - Colorectal carcinoma s/p partial colectomy   Past Surgical History Past Surgical History:  Procedure Laterality Date   APPENDECTOMY     BIOPSY  12/23/2022   Procedure: BIOPSY;  Surgeon: Genell Ken, MD;  Location: [facility];  Service: Gastroenterology;;   BIOPSY  08/03/2023   Procedure: BIOPSY;  Surgeon: Genell Ken, MD;  Location: [facility];  Service: Gastroenterology;;   CARPAL TUNNEL RELEASE  CATARACT EXTRACTION Bilateral    COLONOSCOPY WITH PROPOFOL  N/A 08/03/2023   Procedure: COLONOSCOPY WITH PROPOFOL ;  Surgeon: Genell Ken, MD;  Location: [facility];  Service: Gastroenterology;  Laterality: N/A;   ESOPHAGEAL DILATION  12/23/2022   Procedure: ESOPHAGEAL DILATION;  Surgeon: Genell Ken, MD;  Location: [facility];  Service: Gastroenterology;;   ESOPHAGOGASTRODUODENOSCOPY (EGD) WITH PROPOFOL  N/A 12/23/2022   Procedure: ESOPHAGOGASTRODUODENOSCOPY (EGD) WITH PROPOFOL  W/ DILATION;  Surgeon: Genell Ken, MD;  Location: [facility];  Service: Gastroenterology;  Laterality: N/A;  with dilation   HERNIA REPAIR     LAPAROSCOPIC RIGHT HEMI COLECTOMY Right 10/08/2023   Procedure: LAPAROSCOPIC RIGHT HEMICOLECTOMY;  Surgeon: Melvenia Stabs, MD;  Location: [facility];  Service: General;  Laterality: Right;   POLYPECTOMY  08/03/2023   Procedure: POLYPECTOMY;  Surgeon: Genell Ken, MD;  Location: [facility];  Service: Gastroenterology;;   tarsal tunnel surgery       Social:  Lives With: daughter  Level of Function: dependent with ADLs, daughter helps and has aid that comes and helps her throughout the week with getting her food, taking a shower, ect  Substances: -Tobacco: no tobacco use  -Alcohol: no alcohol use  -Recreational Drug: no illicit drug use   Family History:  Family History  Problem Relation Age of Onset   Diabetes Father    Asthma Brother    Cancer Daughter 40       BREAST CANCER    Allergies: Allergies as of 04/27/2024   (No Known Allergies)    Review of Systems: A complete ROS was negative except as per HPI.   OBJECTIVE:   Physical Exam: Blood pressure 120/73, pulse 61, temperature 98 F (36.7 C), temperature source Oral, resp. rate 12, height 5' 5 (1.651 m), weight 65.8 kg, SpO2 96%.  Constitutional: alert and oriented, well-appearing laying in bed in no acute distress Eyes: conjunctiva non-erythematous Cardiovascular: rate is normal, irregular rhythm, no m/r/g, 2+ radial, DP, posterior tibialis pulses bilaterally, no JVD, no LE edema, extremities slightly cooler to touch  Pulmonary/Chest: Reduced breath sounds in left lower lobe. Dullness to percussion on left lower lobe.  Abdominal: non-tender, non-distended Neurological: alert & oriented x 3, strength equal in bilateral upper and lower extremities, normal extraocular movement, normal pupil constriction to light, normal finger to nose test with slight intention tremor Skin: dry Psych: pleasant   Labs: CBC    Component Value Date/Time   WBC 6.2 04/27/2024 1300   RBC 4.41 04/27/2024 1300   HGB 13.6 04/27/2024 1300   HGB 11.1 (L) 08/06/2023 1231   HGB 9.7 (L) 01/21/2022 0923   HCT 43.0 04/27/2024 1300   HCT 29.0 (L)  01/21/2022 0923   PLT 172 04/27/2024 1300   PLT 204 08/06/2023 1231   PLT 238 01/21/2022 0923   MCV 97.5 04/27/2024 1300   MCV 86 01/21/2022 0923   MCH 30.8 04/27/2024 1300   MCHC 31.6 04/27/2024 1300   RDW 14.6 04/27/2024 1300   RDW 13.9 01/21/2022 0923   LYMPHSABS 1.6 04/27/2024 1300   MONOABS 0.5 04/27/2024 1300   EOSABS 0.1 04/27/2024 1300   BASOSABS 0.0 04/27/2024 1300     CMP     Component Value Date/Time   NA 143 04/27/2024 1300   NA 141 01/21/2022 0923   K 4.3 04/27/2024 1300   CL 104 04/27/2024 1300   CO2 27 04/27/2024 1300   GLUCOSE 149 (H) 04/27/2024 1300   BUN 17 04/27/2024 1300   BUN 18 01/21/2022 0923  CREATININE 1.25 (H) 04/27/2024 1300   CREATININE 1.08 (H) 08/06/2023 1231   CALCIUM 9.0 04/27/2024 1300   PROT 7.4 04/27/2024 1300   ALBUMIN 3.4 (L) 04/27/2024 1300   AST 18 04/27/2024 1300   AST 16 08/06/2023 1231   ALT 10 04/27/2024 1300   ALT 9 08/06/2023 1231   ALKPHOS 50 04/27/2024 1300   BILITOT 0.7 04/27/2024 1300   BILITOT 0.4 08/06/2023 1231   GFRNONAA 41 (L) 04/27/2024 1300   GFRNONAA 49 (L) 08/06/2023 1231   GFRAA 58 (L) 07/14/2020 0424      Imaging:  DG Chest 2 View CLINICAL DATA:  Shortness of breath for 3 weeks.  Pleural effusion.  EXAM: CHEST - 2 VIEW  COMPARISON:  January 25, 2024.  FINDINGS: Stable large left pleural effusion is noted. Right lung is clear. Stable cardiomediastinal silhouette. Bony thorax is unremarkable.  IMPRESSION: Stable large left pleural effusion.  Electronically Signed   By: Rosalene Colon M.D.   On: 04/27/2024 13:33   EKG: personally reviewed my interpretation is Afib with RVR. Prior EKG Afib.  ASSESSMENT & PLAN:   Assessment & Plan by Problem: Principal Problem:   Pleural effusion Active Problems:   Type 2 diabetes mellitus (HCC)   Chronic kidney disease (CKD), stage III (moderate)   Chronic atrial fibrillation (HCC)   Chronic HFrEF (heart failure with reduced ejection fraction) (HCC)    Gout   Esophageal dysmotility   89 year old with chronic HFrEF, persistent A-fib, and history of pleural effusion presents to the ED by way of referral from her cardiologist for shortness of breath with hypoxia. Patient is stable as of now with 2L via nasal cannula, no hypoxia.   Principal Problem:  Pleural effusion, left Chronic and recurrent, first discovered 11/2023.  She is stable right now.  No signs of tamponade on EKG. Normotensive although blood pressure will fluctuate with position changes. Minimal hypoxia, doing well on 2 L via nasal cannula. Had thoracentesis in February at [facility] and was exudative thought to be due to pneumonitis from chronic aspiration. Likely a cause of her slow progressive dyspnea on exertion.  -Consult PCCM in the AM for possible inpatient thoracentesis.  -Holding her Eliquis  for now in case she does get thoracentesis.   -med tele overnight observation  Active Problems: Hypoxic respiratory failure (HCC) Stable on 2 L/min right now likely slowly accumulating left-sided pleural effusion. Considered HFrEF exacerbation but she is not volume overloaded. Denies orthopnea and PND. No JVD or crackles on physical exam. She does have biventricular heart failure and worsening pulmonary hypertension, which could also be related to her pleural effusion. Her echos however have shown a progressive worsening of her right ventricular systolic function which is now moderate to severely reduced. She also has mild thickening of her R ventricular wall, suggesting R heart strain. Her hypoxia is also less likely due to pneumonia (no fevers, chills, cough) or PE (dyspnea was not acute, patient has been hemodynamically stable).  Will treat the pleural effusion with thoracentesis and reassess will need ambulatory SpO2 monitoring while admitted.  Hopefully she will not need oxygen on discharge.   Chronic biventricular HFrEF (heart failure with reduced ejection fraction) (HCC) Stable,  euvolemic. seems like her EF has been slowly worsening since 2018. EF slightly improved from February though 30-35% (at [facility]) and now 40-45%. She is on Jardiance  and Metoprolol . May have a component of right sided heart failure as stated above.  -cw metoprolol  50mg  daily -cw Jardiance  10mg  daily  Chronic  atrial fibrillation (HCC) Stable, persistent, rate controlled on metoprolol  50mg  daily.  Heart rate in the 70s at the time of my evaluation.   -Continue outpatient metoprolol .  -Will hold anticoagulation due to thoracentesis being performed tomorrow.   Chronic kidney disease (CKD), stage III (moderate) Stable, eGFR fluctuates between stage IIIb and IIIa. Cr 1.25, close to baseline.  Type 2 diabetes mellitus (HCC) Stable, no significant hyperglycemia.  Hgb A1c was 5.3 7 months ago. Continue outpatient Jardiance .  Will defer starting insulin  for her and monitor CBGs    Gout No acute flare.  Continue home allopurinol  100mg  daily.  Esophageal dysmotility Hx of chronic dysphagia with severe esophageal dysmotility. Last EGD was done 12/25/2023. On chart review it seems that it was unremarkable and there were no signs of any strictures but cannot access this on her chart. A year prior on 12/2022 she did get an EGD at [facility] which did not show any esophageal abnormalities but due to dysphagia she underwent dilatation of the lower third of the esophagus.  OB 12/23/2023 she underwent [place] upper GI double contrast for intractable N/V and it showed moderate to severe dysmotility of the esophagus with absent primary peristalsis with concerns for achalasia. No hiatal hernia was present or reflux. FU with manometry was recommended but it seems that this has not been done yet. Speech eval at [facility] in 12/2023 there were no concerns for oropharyngeal dysphagia therefore deferred modified barium swallow, recommended soft bites foods and thin liquids. Her daughter monitors her diet at home, she eats regular food but avoids  crispy items.  Chronic aspiration thought to be a contributor to pneumonitis and pleural effusion.   -Start dysphagia 2 diet, consider SLP consult.  Falls Had a fall on Saturday prior to admission, says she hit her head. Patient endorsed dizziness prior to fall, could be orthostatic or it may be due to permanent Afib. No LOC.  Neurologically intact at this time. Did endorse headache, but no papilledema or vision changes. At present, recommend continuing Eliquis  for stroke risk reduction after thoracentesis. She also has pain in the bilateral knees, contributing to instability.  -PT/OT.  Diet: Dysphagia 2 diet  VTE: SCDs  Code: Full  Prior to Admission Living Arrangement: Home  Anticipated Discharge Location: Home  Barriers to Discharge: Thoracentesis   Dispo: Admit patient to Observation with expected length of stay less than 2 midnights.  Signed: Oliva Beth, Medical Student Medical Student, MS3 04/27/2024, 9:51 PM   I have obtained the history and assessed the patient concurrently with the medical student. I have reviewed and concur with this student's documentation.   Jose Ngo, MD 04/28/2024 12:12 AM   Please contact IM Residency On-Call Pager at: [phone] or [phone].

## 2024-04-27 NOTE — ED Provider Notes (Signed)
 [place] EMERGENCY DEPARTMENT AT [facility] Provider Note   CSN: [id number] Arrival date & time: 04/27/24  1156     History  Chief Complaint  Patient presents with   Shortness of Breath    Athena CLAYTON JARMON is a 89 y.o. female.  Patient is a 89 year old female who presents with shortness of breath.  History obtained from the patient and the patient's daughter who is at bedside.  She has a history of hypertension, diabetes, atrial fibrillation on Eliquis , CHF.  She was having an outpatient echo done yesterday.  Daughter said that she had abnormal findings and was told to come to the emergency room.  She has not had any fevers.  No increased leg swelling.  No chest discomfort.  She has a little bit of a cough which seems to be pretty baseline for her.  Discussed with cardiology Laveta Pottier) and reviewed notes.  She had an echo yesterday.  The EF was slightly lower than her prior values.  However she had a large left pleural effusion.  According to Trish, she was hypoxic with oxygen saturations around 80% in the office and was told to go directly to the ED.  The daughter said that she did not want to go yesterday and came today.  She does say that her breathing has worsened over the last few weeks.  She is only able to walk short distances without getting short of breath.       Home Medications Prior to Admission medications   Medication Sig Start Date End Date Taking? Authorizing Provider  ACCU-CHEK AVIVA PLUS test strip  10/06/20   [provider]  acetaminophen  (TYLENOL ) 500 MG tablet Take 1,000 mg by mouth every 6 (six) hours as needed for mild pain or headache.    [provider]  allopurinol  (ZYLOPRIM ) 100 MG tablet TAKE 1 TABLET(100 MG) BY MOUTH DAILY Patient taking differently: Take 100 mg by mouth daily. 08/09/21   Dot Gazella, DPM  amLODipine  (NORVASC ) 2.5 MG tablet Take 1 tablet (2.5 mg total) by mouth daily. 01/28/24 02/28/24  Rizwan, Saima, MD   Cholecalciferol  (VITAMIN D ) 125 MCG (5000 UT) CAPS Take 5,000 Units by mouth daily with breakfast.    [provider]  diclofenac  Sodium (VOLTAREN ) 1 % GEL Apply 4 g topically 4 (four) times daily. 11/29/23   Albertus Hughs, DO  ELIQUIS  5 MG TABS tablet TAKE 1 TABLET(5 MG) BY MOUTH TWICE DAILY 02/19/24   Loyde Rule, MD  empagliflozin  (JARDIANCE ) 10 MG TABS tablet TAKE 1 TABLET(10 MG) BY MOUTH DAILY 12/24/23   Nishan, Peter C, MD  folic acid  (FOLVITE ) 800 MCG tablet Take 800 mcg by mouth daily.    [provider]  furosemide  (LASIX ) 20 MG tablet Take 1 tablet (20 mg total) by mouth daily. 11/25/23   Gerald Kitty., NP  gabapentin  (NEURONTIN ) 400 MG capsule Take 400 mg by mouth 3 (three) times daily. 03/25/22   [provider]  ipratropium (ATROVENT ) 0.06 % nasal spray Place 2 sprays into both nostrils 2 (two) times daily as needed (nasal drainage). 02/17/24 03/18/24  Reynold Caves, MD  Iron-FA-B Cmp-C-Biot-Probiotic (FUSION PLUS) CAPS Take 1 capsule by mouth every other day. 08/26/23   [provider]  loratadine  (CLARITIN ) 10 MG tablet Take 10 mg by mouth daily as needed for allergies. Patient not taking: Reported on 03/23/2024 08/28/23   [provider]  metoprolol  succinate (TOPROL  XL) 50 MG 24 hr tablet Take 1 tablet (50  mg total) by mouth daily. Take with or immediately following a meal. 11/25/23   Gerald Kitty., NP  Multiple Vitamins-Minerals (ICAPS AREDS 2 PO) Take 1 capsule by mouth 2 (two) times daily.    [provider]  ondansetron  (ZOFRAN -ODT) 4 MG disintegrating tablet Take 4 mg by mouth every 8 (eight) hours as needed for nausea or vomiting. 12/26/23   [provider]  Potassium Chloride  ER 20 MEQ TBCR Take 20 mEq by mouth daily. 04/09/22   Nieves Bars D, NP      Allergies    Patient has no known allergies.    Review of Systems   Review of Systems  Constitutional:  Positive for fatigue. Negative for chills, diaphoresis and fever.   HENT:  Positive for congestion (Baseline for her). Negative for rhinorrhea and sneezing.   Eyes: Negative.   Respiratory:  Positive for shortness of breath. Negative for cough and chest tightness.   Cardiovascular:  Negative for chest pain and leg swelling.  Gastrointestinal:  Negative for abdominal pain, blood in stool, diarrhea, nausea and vomiting.  Genitourinary:  Negative for difficulty urinating, flank pain, frequency and hematuria.  Musculoskeletal:  Negative for arthralgias and back pain.  Skin:  Negative for rash.  Neurological:  Negative for dizziness, speech difficulty, weakness, numbness and headaches.    Physical Exam Updated Vital Signs BP (!) 102/58   Pulse 75   Temp 98 F (36.7 C) (Oral)   Resp (!) 23   Ht 5' 5 (1.651 m)   Wt 65.8 kg   SpO2 90%   BMI 24.13 kg/m  Physical Exam Constitutional:      Appearance: She is well-developed.  HENT:     Head: Normocephalic and atraumatic.  Eyes:     Pupils: Pupils are equal, round, and reactive to light.  Cardiovascular:     Rate and Rhythm: Normal rate and regular rhythm.     Heart sounds: Normal heart sounds.  Pulmonary:     Effort: Pulmonary effort is normal. No respiratory distress.     Breath sounds: Examination of the left-middle field reveals decreased breath sounds. Examination of the left-lower field reveals decreased breath sounds. Decreased breath sounds present. No wheezing or rales.  Chest:     Chest wall: No tenderness.  Abdominal:     General: Bowel sounds are normal.     Palpations: Abdomen is soft.     Tenderness: There is no abdominal tenderness. There is no guarding or rebound.  Musculoskeletal:        General: Normal range of motion.     Cervical back: Normal range of motion and neck supple.     Comments: No significant edema or calf tenderness  Lymphadenopathy:     Cervical: No cervical adenopathy.  Skin:    General: Skin is warm and dry.     Findings: No rash.  Neurological:     Mental  Status: She is alert and oriented to person, place, and time.     ED Results / Procedures / Treatments   Labs (all labs ordered are listed, but only abnormal results are displayed) Labs Reviewed  BRAIN NATRIURETIC PEPTIDE - Abnormal; Notable for the following components:      Result Value   B Natriuretic Peptide 219.8 (*)    All other components within normal limits  COMPREHENSIVE METABOLIC PANEL WITH GFR - Abnormal; Notable for the following components:   Glucose, Bld 149 (*)    Creatinine, Ser 1.25 (*)  Albumin 3.4 (*)    GFR, Estimated 41 (*)    All other components within normal limits  CBC WITH DIFFERENTIAL/PLATELET  TROPONIN I (HIGH SENSITIVITY)  TROPONIN I (HIGH SENSITIVITY)    EKG EKG Interpretation Date/Time:  Wednesday April 27 2024 12:34:04 EDT Ventricular Rate:  101 PR Interval:    QRS Duration:  78 QT Interval:  286 QTC Calculation: 370 R Axis:   -25  Text Interpretation: Atrial fibrillation with rapid ventricular response Low voltage QRS Septal infarct , age undetermined Abnormal ECG When compared with ECG of 25-Jan-2024 15:04, PREVIOUS ECG IS PRESENT since last tracing no significant change Confirmed by Hershel Los [phone]) on 04/27/2024 5:10:46 PM  Radiology DG Chest 2 View Result Date: 04/27/2024 CLINICAL DATA:  Shortness of breath for 3 weeks.  Pleural effusion. EXAM: CHEST - 2 VIEW COMPARISON:  January 25, 2024. FINDINGS: Stable large left pleural effusion is noted. Right lung is clear. Stable cardiomediastinal silhouette. Bony thorax is unremarkable. IMPRESSION: Stable large left pleural effusion. Electronically Signed   By: Rosalene Colon M.D.   On: 04/27/2024 13:33   ECHOCARDIOGRAM COMPLETE Result Date: 04/26/2024    ECHOCARDIOGRAM REPORT   Patient Name:   KENZIE THORESON Date of Exam: 04/26/2024 Medical Rec #:  [id number]        Height:       65.0 in Accession #:    [id number]       Weight:       144.6 lb Date of Birth:  1933/05/09        BSA:           1.723 m Patient Age:    89 years         BP:           92/60 mmHg Patient Gender: F                HR:           99 bpm. Exam Location:  [address] Procedure: 2D Echo, Cardiac Doppler and Color Doppler (Both Spectral and Color            Flow Doppler were utilized during procedure). Indications:    R93.1 Decreased Cardiac Ejection Fraction  History:        Patient has prior history of Echocardiogram examinations, most                 recent 03/27/2022. Arrythmias:Atrial Fibrillation; Risk                 Factors:Hypertension, Diabetes and Sleep Apnea.  Sonographer:    Cheri Coria Rodgers-Jones RDCS Referring Phys: [id number] PETER C NISHAN IMPRESSIONS  1. Left ventricular ejection fraction, by estimation, is 40 to 45%. The left ventricle has mildly decreased function. The left ventricle demonstrates global hypokinesis. Left ventricular diastolic function could not be evaluated.  2. Right ventricular systolic function moderate to severely reduced. The right ventricular size is normal. Mildly increased right ventricular wall thickness.  3. Right atrial size was mildly dilated.  4. There is no evidence of cardiac tamponade. Large pleural effusion in the left lateral region.  5. The mitral valve is grossly normal. Mild to moderate mitral valve regurgitation. No evidence of mitral stenosis.  6. The aortic valve is tricuspid. Aortic valve regurgitation is trivial. Aortic valve sclerosis is present, with no evidence of aortic valve stenosis.  7. The inferior vena cava is normal in size with greater than 50% respiratory variability, suggesting right atrial pressure of  3 mmHg. Comparison(s): A prior study was performed on 03/27/2022. Reported LVEF 50-55%, RVSP 41 mmHg, biatrial dilatation, small pericardial effusion, mild MR. Large pleural effusion is new when compared to prior study. Conclusion(s)/Recommendation(s): Echo tech reached out as patient is quite symptomatic due to shortness of breath with minimal efforts (ADLs) as  well as during image acquisition. Given the symptomatic dyspnea recommended ER evaluation. Patient is accompanied by her daughter who will escort her to the ER. FINDINGS  Left Ventricle: Left ventricular ejection fraction, by estimation, is 40 to 45%. The left ventricle has mildly decreased function. The left ventricle demonstrates global hypokinesis. The left ventricular internal cavity size was normal in size. There is  no left ventricular hypertrophy. Left ventricular diastolic function could not be evaluated due to nondiagnostic images. Left ventricular diastolic function could not be evaluated. Right Ventricle: The right ventricular size is normal. Mildly increased right ventricular wall thickness. Right ventricular systolic function moderate to severely reduced. Left Atrium: Left atrial size was normal in size. Right Atrium: Right atrial size was mildly dilated. Pericardium: Trivial pericardial effusion is present. The pericardial effusion is circumferential. There is no evidence of cardiac tamponade. Presence of epicardial fat layer. Mitral Valve: The mitral valve is grossly normal. Mild to moderate mitral valve regurgitation. No evidence of mitral valve stenosis. Tricuspid Valve: The tricuspid valve is grossly normal. Tricuspid valve regurgitation is not demonstrated. No evidence of tricuspid stenosis. Aortic Valve: The aortic valve is tricuspid. Aortic valve regurgitation is trivial. Aortic valve sclerosis is present, with no evidence of aortic valve stenosis. Pulmonic Valve: The pulmonic valve was not well visualized. Pulmonic valve regurgitation is not visualized. No evidence of pulmonic stenosis. Aorta: The aortic root is normal in size and structure. Venous: The inferior vena cava is normal in size with greater than 50% respiratory variability, suggesting right atrial pressure of 3 mmHg. IAS/Shunts: The atrial septum is grossly normal. Additional Comments: There is a large pleural effusion in the left  lateral region.  LEFT VENTRICLE PLAX 2D LVIDd:         4.60 cm LVIDs:         3.40 cm LV PW:         1.10 cm LV IVS:        0.90 cm LVOT diam:     2.00 cm LV SV:         27 LV SV Index:   16 LVOT Area:     3.14 cm  RIGHT VENTRICLE            IVC RV Basal diam:  3.00 cm    IVC diam: 1.90 cm RV S prime:     5.80 cm/s LEFT ATRIUM             Index        RIGHT ATRIUM           Index LA diam:        4.10 cm 2.38 cm/m   RA Area:     17.90 cm LA Vol (A2C):   33.5 ml 19.44 ml/m  RA Volume:   53.70 ml  31.16 ml/m LA Vol (A4C):   46.1 ml 26.75 ml/m LA Biplane Vol: 40.8 ml 23.67 ml/m  AORTIC VALVE LVOT Vmax:   49.47 cm/s LVOT Vmean:  31.833 cm/s LVOT VTI:    0.086 m  AORTA Ao Root diam: 3.50 cm MV E velocity: 109.33 cm/s  SHUNTS                             Systemic VTI:  0.09 m                             Systemic Diam: 2.00 cm Sunit Tolia Electronically signed by Olinda Bertrand Signature Date/Time: 04/26/2024/3:44:09 PM    Final     Procedures Procedures    Medications Ordered in ED Medications  acetaminophen  (TYLENOL ) tablet 650 mg (650 mg Oral Given 04/27/24 1721)    ED Course/ Medical Decision Making/ A&P                                 Medical Decision Making Risk OTC drugs.   This patient presents to the ED for concern of shortness of breath, this involves an extensive number of treatment options, and is a complaint that carries with it a high risk of complications and morbidity.  I considered the following differential and admission for this acute, potentially life threatening condition.  The differential diagnosis includes pneumonia, lung mass, pleural effusion, CHF, ACS, pneumothorax  MDM:    Patient is 89 year old who presents with worsening shortness of breath over the last 2 to 3 weeks.  She had an echo yesterday and was noted to be hypoxic with oxygen saturations around 80%.  Her echo shows a slightly lower EF but she maintains a large pleural effusion.  On  chart review, it does appear that she has had this persistent pleural effusion for several months.  However she does seem to be becoming more symptomatic.  She is maintaining oxygen saturations here around 90 to 91%.  She does not have increased work of breathing.  Her daughter says she can only walk short distances without getting short of breath.  Her chest x-ray does not show any apparent change in the pleural effusion.  Her labs are overall nonconcerning.  She may benefit from thoracentesis.  Discussed with the internal medicine resident who is on-call for unassigned who will admit the patient for further treatment.  (Labs, imaging, consults)  Labs: I Ordered, and personally interpreted labs.  The pertinent results include: Mildly elevated creatinine, mildly elevated BNP  Imaging Studies ordered: I ordered imaging studies including chest x-ray I independently visualized and interpreted imaging. I agree with the radiologist interpretation  Additional history obtained from chart.  External records from outside source obtained and reviewed including prior notes  Cardiac Monitoring: The patient was maintained on a cardiac monitor.  If on the cardiac monitor, I personally viewed and interpreted the cardiac monitored which showed an underlying rhythm of: Atrial fibrillation  Reevaluation: After the interventions noted above, I reevaluated the patient and found that they have :stayed the same  Social Determinants of Health:  none  Disposition: Admitted to the hospital  Co morbidities that complicate the patient evaluation  Past Medical History:  Diagnosis Date   A-fib (HCC)    Acute on chronic diastolic CHF (congestive heart failure) (HCC) 03/27/2022   Anemia    Arthritis    Atrial fibrillation (HCC)    Atrial fibrillation with RVR (HCC) 03/20/2017   Cancer (HCC)    colon cancer   CAP (community acquired pneumonia) 01/25/2024   Chronic diastolic CHF (congestive heart failure) (HCC)  10/31/2020   DM (diabetes mellitus) (HCC)  HTN (hypertension)    Occasional tremors    on Inderal  for this   Sepsis secondary to UTI (HCC) 07/11/2020   Sleep apnea    not used cpap in a long time per daughter   Tremor      Medicines Meds ordered this encounter  Medications   acetaminophen  (TYLENOL ) tablet 650 mg    I have reviewed the patients home medicines and have made adjustments as needed  Problem List / ED Course: Problem List Items Addressed This Visit   None Visit Diagnoses       Shortness of breath    -  Primary             Final Clinical Impression(s) / ED Diagnoses Final diagnoses:  Shortness of breath    Rx / DC Orders ED Discharge Orders     None         Hershel Los, MD 04/27/24 2023

## 2024-04-27 NOTE — Telephone Encounter (Signed)
 Call sent straight to triage. Patient's daughter stated they have been in the ED but have not seen anyone. While talking on the phone it sounded like patient was being brought back to see someone. Encouraged patient's daughter to have patience, that patient needs to get evaluated and treated for her pleural effusion.

## 2024-04-27 NOTE — Hospital Course (Addendum)
 []  Repeat BMP to assess creatinine/GFR []  Outpatient GI follow-up at [facility] for esophageal dysmotility and recurrent aspiration  ------------------ Anne Roach is a 89 year-old female with PMH of HFimpEF (EF 40-45%), permanent Afib on Eliquis , CKD stage III, T2DM, esophageal dysmotility, and colorectal carcinoma s/p partial colectomy in 09/2023 who presented for hypoxia and admitted on 04/28/2024 for large left pleural effusion.   #Left Pleural Effusion Patient has had small bilateral pleural effusions for the past several years per imaging on chart review. Left pleural effusion started increasing in size around 11/2023. She eventually underwent thoracentesis during hospitalization at [facility] in 12/2023, at which time 1.4L of fluid was drained. Pleural fluid studies with cholesterol 58, LDH 72 (serum LDH 117), total protein 4 (serum total protein 6.5), glucose 104, total nucleated cells 2322, and triglycerides 58, which was consistent with an exudative effusion. Etiology thought to be secondary to pneumonitis from chronic aspiration. Patient presented with 2 months of progressively worsening shortness of breath and hypoxia to SpO2 80s when seen by her outpatient cardiologist, prompting referral to ED. Initial CXR with stable large left pleural effusion. She underwent 1.5L thoracentesis with PCCM on 6/12 and 1.2L thoracentesis on 6/13. Pleural fluid studies with LDH 75 (serum 123), total protein 4.4 (serum 6.0), TNC 1214, glucose 128, amylase 43, triglycerides 55, gram stain with few WBC, and cytology with chronic inflammation. These results technically met criteria for exudative effusion based on increased protein (ratio 0.67). CT Chest obtained with new heterogeneous opacities mainly in the lingular segment of left upper lobe and less pronounced opacities in the left lung lower lobe compatible with multilobar pneumonia, trace left pleural effusion, and markedly dilated in  ingested food material/air-filled upper third thoracic esophagus. Discussed results with PCCM who thought recurrent pulmonary effusion was pseudoexudative secondary to diuretic use for CHF.  #Hypoxic respiratory failure  Patient was hypoxic likely secondary to large pleural effusion since her oxygen requirement decreased after the two thoracenteses. Other differentials included heart failure exacerbation, but patient was euvolemic on exam without crackles, JVD, or LE edema. Pulmonary embolism was considered, but presentation not consistent since dyspnea was not acute and patient remained hemodynamically stable. Also considered pneumonia given concern for multilobar pneumonia on CT Chest, but did not start antibiotic treatment since patient was asymptomatic and findings mostly likely secondary to chronic aspiration. Patient did *** need home oxygen at discharge per ambulatory pulse ox.   #Falls #Orthostatic hypotension Patient reported recent fall on 04/23/2024 from dizziness. Etiology initially thought to be secondary to orthostatic hypotension vs permanent Afib. PT and OT evaluated patient and recommended home health PT and OT. Orthostatic vital signs were checked on 6/14 and positive with lying BP 92/59, sitting BP 80/55, and standing BP 69/37. Her home furosemide  was held for *** days. Repeat orthostatics on ***   #Biventricular heart failure Patient remained euvolemic on exam throughout hospitalization. EF 40-45% on most recent echocardiogram, which actually improved from EF 30-35% in 12/2022. She was continued on home Jardiance , metoprolol , and furosemide  while hospitalized.    #Esophageal dysmotility Patient has a history of chronic dysphagia in the setting of severe esophageal dysmotility. SLP consulted this admission and signed off after no signs of oropharyngeal dysphagia on evaluation. They recommended mechanical soft diet (dysphagia 3), which was continued throughout admission. Patient was  continued on home pantoprazole  40 mg daily while hospitalized. She should follow-up with outpatient GI at [facility] if symptoms worsen.   #Permanent atrial fibrillation Patient was previously  diagnosed with permanent A-fib. She remained in atrial fibrillation throughout hospitalization with rates 70-100s. Her home metoprolol  succinate 50mg  daily was continued during hospitalization. Her home Eliquis  5 mg was intermittently held for thoracentesis but was resumed by discharge.   #CKD stage III Creatinine fluctuating this admission (1.25 -> 1.08 -> 1.29), which was mildly elevated from baseline of 0.9-1.0. Recommend repeat BMP outpatient.   #T2DM Most recent Hgb A1c 5.3% in 09/2023. Blood glucose was stable between 105-149 throughout admission. Her home Jardiance  was continued throughout hospitalization.   #Gout Patient had no signs or symptoms of an acute gout flare. Her home allopurinol  100mg  daily was continued throughout hospitalization.

## 2024-04-27 NOTE — Telephone Encounter (Signed)
 Called patient's daughter [facility]) about echo results and the advisement to go to the ED yesterday by Dr. Albert Huff and Dr. Stann Earnest after her echo yesterday. She stated that the patient did not want to be sitting in the waiting room for hours. Encouraged them to go so the patient can get treated. Daughter stated she would take her at 11:00 today. Will send message to provider so they are aware and can contact ED.  Per Dr. Stann Earnest, Would have her go to ER given age, chronic afib symptoms and large left effusion with EF 40-45% Admit to hospitalist and have IR radiology drain left effusion.

## 2024-04-27 NOTE — ED Triage Notes (Signed)
 Pt had echo done yesterday and showed plural effusions. C/O [facility] for 3 weeks. Denies CP but having palpitations. Hx of afib.

## 2024-04-28 ENCOUNTER — Observation Stay (HOSPITAL_COMMUNITY)

## 2024-04-28 DIAGNOSIS — R0602 Shortness of breath: Secondary | ICD-10-CM | POA: Diagnosis not present

## 2024-04-28 DIAGNOSIS — N1832 Chronic kidney disease, stage 3b: Secondary | ICD-10-CM | POA: Diagnosis not present

## 2024-04-28 DIAGNOSIS — N183 Chronic kidney disease, stage 3 unspecified: Secondary | ICD-10-CM

## 2024-04-28 DIAGNOSIS — K224 Dyskinesia of esophagus: Secondary | ICD-10-CM

## 2024-04-28 DIAGNOSIS — I4891 Unspecified atrial fibrillation: Secondary | ICD-10-CM

## 2024-04-28 DIAGNOSIS — J90 Pleural effusion, not elsewhere classified: Secondary | ICD-10-CM | POA: Diagnosis not present

## 2024-04-28 LAB — AMYLASE, PLEURAL OR PERITONEAL FLUID: Amylase, Fluid: 43 U/L

## 2024-04-28 LAB — LACTATE DEHYDROGENASE: LDH: 123 U/L (ref 98–192)

## 2024-04-28 LAB — BODY FLUID CELL COUNT WITH DIFFERENTIAL
Lymphs, Fluid: 85 %
Monocyte-Macrophage-Serous Fluid: 9 % — ABNORMAL LOW (ref 50–90)
Neutrophil Count, Fluid: 6 % (ref 0–25)
Total Nucleated Cell Count, Fluid: 969 uL (ref 0–1000)

## 2024-04-28 LAB — PROTEIN, PLEURAL OR PERITONEAL FLUID: Total protein, fluid: 4.9 g/dL

## 2024-04-28 LAB — ALBUMIN, PLEURAL OR PERITONEAL FLUID: Albumin, Fluid: 2.7 g/dL

## 2024-04-28 LAB — LACTATE DEHYDROGENASE, PLEURAL OR PERITONEAL FLUID: LD, Fluid: 73 U/L — ABNORMAL HIGH (ref 3–23)

## 2024-04-28 MED ORDER — PANTOPRAZOLE SODIUM 40 MG PO TBEC
40.0000 mg | DELAYED_RELEASE_TABLET | Freq: Every day | ORAL | Status: DC
  Administered 2024-04-28 – 2024-05-03 (×6): 40 mg via ORAL
  Filled 2024-04-28 (×6): qty 1

## 2024-04-28 NOTE — ED Notes (Signed)
 Provider @ bedside for thora

## 2024-04-28 NOTE — Evaluation (Signed)
 Clinical/Bedside Swallow Evaluation Patient Details  Name: Anne Roach MRN: [id number] Date of Birth: 1933/07/02  Today's Date: 04/28/2024 Time: SLP Start Time (ACUTE ONLY): 0755 SLP Stop Time (ACUTE ONLY): 0817 SLP Time Calculation (min) (ACUTE ONLY): 22 min  Past Medical History:  Past Medical History:  Diagnosis Date   A-fib (HCC)    Acute on chronic diastolic CHF (congestive heart failure) (HCC) 03/27/2022   Anemia    Arthritis    Atrial fibrillation (HCC)    Atrial fibrillation with RVR (HCC) 03/20/2017   Cancer (HCC)    colon cancer   CAP (community acquired pneumonia) 01/25/2024   Chronic diastolic CHF (congestive heart failure) (HCC) 10/31/2020   DM (diabetes mellitus) (HCC)    HTN (hypertension)    Occasional tremors    on Inderal  for this   Sepsis secondary to UTI (HCC) 07/11/2020   Sleep apnea    not used cpap in a long time per daughter   Tremor    Past Surgical History:  Past Surgical History:  Procedure Laterality Date   APPENDECTOMY     BIOPSY  12/23/2022   Procedure: BIOPSY;  Surgeon: Genell Ken, MD;  Location: [facility];  Service: Gastroenterology;;   BIOPSY  08/03/2023   Procedure: BIOPSY;  Surgeon: Genell Ken, MD;  Location: [facility];  Service: Gastroenterology;;   CARPAL TUNNEL RELEASE     CATARACT EXTRACTION Bilateral    COLONOSCOPY WITH PROPOFOL  N/A 08/03/2023   Procedure: COLONOSCOPY WITH PROPOFOL ;  Surgeon: Genell Ken, MD;  Location: [facility];  Service: Gastroenterology;  Laterality: N/A;   ESOPHAGEAL DILATION  12/23/2022   Procedure: ESOPHAGEAL DILATION;  Surgeon: Genell Ken, MD;  Location: [facility];  Service: Gastroenterology;;   ESOPHAGOGASTRODUODENOSCOPY (EGD) WITH PROPOFOL  N/A 12/23/2022   Procedure: ESOPHAGOGASTRODUODENOSCOPY (EGD) WITH PROPOFOL  W/ DILATION;  Surgeon: Genell Ken, MD;  Location: [facility];  Service: Gastroenterology;  Laterality: N/A;  with dilation   HERNIA REPAIR     LAPAROSCOPIC RIGHT HEMI  COLECTOMY Right 10/08/2023   Procedure: LAPAROSCOPIC RIGHT HEMICOLECTOMY;  Surgeon: Melvenia Stabs, MD;  Location: [facility];  Service: General;  Laterality: Right;   POLYPECTOMY  08/03/2023   Procedure: POLYPECTOMY;  Surgeon: Genell Ken, MD;  Location: [facility];  Service: Gastroenterology;;   tarsal tunnel surgery      HPI:  Anne Roach is a 89 y.o. female with PMH of chronic pleural effusions, chronic HFrEF, permanent Afib on Eliquis  and metoprolol , CKD III, esophogeal dysmotility, colorectal carcinoma s/p partial colectomy in 2024, and T2DM who was referred to the ED by her cardiologist after echo resulted in reduced LV ejection fraction of 40-45% and hypoxia with O2 sat of 80%. The patient endorses gradual onset of shortness of breath that has lasted for about 2 months. SOB mostly occurs with exertion but does also express SOB while at rest. Denies orthopnea, PND, leg swelling. Also denies fevers, chills, or cough. Endorses lightheadedness and dizziness sometimes upon walking. No wheezes or history of smoking.    Assessment / Plan / Recommendation  Clinical Impression   Pt has a hx of esophageal dysphagia and has undergone multiple tests and procedures to assess swallow function and esophageal motility.  She recently was followed by GI at [facility].  Per notes, GI provider offered esophageal manometry as next step to assess concern for esophageal dysmotility. Pt and daughter had declined due to pt's toleration of  a regular diet without significant concerns for globus sensation, regurgitation, or pulmonary infection. SLP reviewed pt's  hx and these follow up options with daughter and pt today. Daughter expressed testing fatigue with no solution. She reported pt appears to tolerate current diet though does sometime throw up food and phlegm. SLP discussed option for MBSS +esophageal sweep, which she politely declined.   SLP completed clinical swallow assessment with no overt  concerns for an oropharyngeal dysphagia at this time. No overt or subtle s/s of aspiration observed or s/s of backflow/regurgitation. Diet advanced to mechanical soft solids with continuation of thin liquids and PO meds as tolerated.   No acute SLP needs identified at this time. SLP will sign off. Consider GI follow up with [facility] team if pt exhibits future concerns for esophageal dysphagia and dysmotility.   SLP Visit Diagnosis:  (concern for esophageal motility per GI notes)    Aspiration Risk  Mild aspiration risk (postprandial aspiration risk)    Diet Recommendation Dysphagia 3 (Mech soft);Thin liquid    Liquid Administration via: Straw;Cup Medication Administration: Whole meds with liquid (as tolerated) Supervision: Patient able to self feed Compensations: Slow rate;Small sips/bites Postural Changes: Seated upright at 90 degrees    Other  Recommendations Recommended Consults: Consider esophageal assessment (if dysphagia concerns persist, next steps would be esophageal manometry per GI notes.) Oral Care Recommendations: Oral care BID     Assistance Recommended at Discharge  Defer to PT/OT  Functional Status Assessment Patient has not had a recent decline in their functional status (re: swallow function)  Frequency and Duration     N/a; no acute SLP needs       Prognosis Prognosis for improved oropharyngeal function: Fair      Swallow Study   General Date of Onset: 04/27/24 HPI: Anne Roach is a 89 y.o. female with PMH of chronic pleural effusions, chronic HFrEF, permanent Afib on Eliquis  and metoprolol , CKD III, esophogeal dysmotility, colorectal carcinoma s/p partial colectomy in 2024, and T2DM who was referred to the ED by her cardiologist after echo resulted in reduced LV ejection fraction of 40-45% and hypoxia with O2 sat of 80%. The patient endorses gradual onset of shortness of breath that has lasted for about 2 months. SOB mostly occurs with exertion but does  also express SOB while at rest. Denies orthopnea, PND, leg swelling. Also denies fevers, chills, or cough. Endorses lightheadedness and dizziness sometimes upon walking. No wheezes or history of smoking. Type of Study: Bedside Swallow Evaluation Previous Swallow Assessment: Per H/P: Hx of chronic dysphagia with severe esophageal dysmotility. Last EGD was done 12/25/2023. On chart review it seems that it was unremarkable and there were no signs of any strictures but cannot access this on her chart. A year prior on 12/2022 she did get an EGD at [facility] which did not show any esophageal abnormalities but due to dysphagia she underwent dilatation of the lower third of the esophagus.  OB 12/23/2023 she underwent [place] upper GI double contrast for intractable N/V and it showed moderate to severe dysmotility of the esophagus with absent primary peristalsis with concerns for achalasia. No hiatal hernia was present or reflux. FU with manometry was recommended but it seems that this has not been done yet. Speech eval at [facility] in 12/2023 there were no concerns for oropharyngeal dysphagia therefore deferred modified barium swallow, recommended soft bites foods and thin liquids. Her daughter monitors her diet at home, she eats regular food but avoids crispy items.  Chronic aspiration thought to be a contributor to pneumonitis and pleural effusion. Diet Prior to this Study: Dysphagia  2 (finely chopped);Thin liquids (Level 0) Temperature Spikes Noted: No Respiratory Status: Nasal cannula (4L) History of Recent Intubation: No Behavior/Cognition: Alert;Cooperative;Pleasant mood Oral Cavity Assessment: Within Functional Limits Oral Cavity - Dentition: Dentures, top Vision: Functional for self-feeding Self-Feeding Abilities: Able to feed self Patient Positioning: Upright in bed Baseline Vocal Quality: Normal Volitional Swallow: Able to elicit    Oral/Motor/Sensory Function Overall Oral Motor/Sensory Function: Within functional  limits   Ice Chips Ice chips: Not tested   Thin Liquid Thin Liquid: Within functional limits Presentation: Cup;Straw    Nectar Thick Nectar Thick Liquid: Not tested   Honey Thick Honey Thick Liquid: Not tested   Puree Puree: Not tested   Solid     Solid: Within functional limits Presentation: Self Fed      Leverette Read 04/28/2024,8:49 AM

## 2024-04-28 NOTE — Progress Notes (Signed)
 Subjective:  Patient was admitted overnight. Her symptoms were stable when seen this morning. She continues to have shortness of breath, especially when ambulating to the restroom. She has also developed nasal congestion and sneezing since being in the ER. She typically has mild allergies at home, so she is unsure what is causing those symptoms.  Objective:  Vital signs in last 24 hours: Vitals:   04/28/24 0900 04/28/24 1128 04/28/24 1230 04/28/24 1304  BP: 117/88   118/73  Pulse: (!) 139  80 91  Resp: (!) 25  (!) 21 (!) 21  Temp:  97.6 F (36.4 C)  (!) 97.4 F (36.3 C)  TempSrc:  Oral  Axillary  SpO2: 91%  96% 96%  Weight:      Height:        General: Older-appearing female sitting upright in hospital bed with daughter at bedside, appears comfortable and in NAD. HEENT: Normocephalic and atraumatic. Conjunctivae clear. Nasal cannula in place with patient sneezing/blowing nose throughout encounter. Cardiovascular: Normal rate with irregular rhythm. No murmurs, rubs, or gallops. No JVD or LE edema.  Pulmonary: Right lung clear to auscultation. Left lung with decreased breath sounds throughout except upper lobe. No wheezes, rales, or rhonchi. Poor effort when asked to breathe deeply.  Neurological: Alert, responds to questions appropriately. Skin: Warm and dry. Cap refill < 2 seconds.  Assessment/Plan:  Principal Problem:   Pleural effusion Active Problems:   Shortness of breath   Type 2 diabetes mellitus (HCC)   Chronic kidney disease (CKD), stage III (moderate)   Chronic atrial fibrillation (HCC)   Chronic HFrEF (heart failure with reduced ejection fraction) (HCC)   Gout   Esophageal dysmotility   Falls   Hypoxic respiratory failure (HCC)  Anne Roach is a 89 year-old female with PMH of HFimpEF (EF 40-45%), permanent Afib on Eliquis , CKD stage III, T2DM, esophageal dysmotility, and colorectal carcinoma s/p partial colectomy in 09/2023 who presented for hypoxia and  admitted on 04/28/2024 for large left pleural effusion.   #Left Pleural Effusion Patient has had small bilateral pleural effusions for the past several years per imaging on chart review. Left pleural effusion started increasing in size around 11/2023. She eventually underwent thoracentesis during hospitalization at [facility] in 12/2023, at which time 1.4L of fluid was drained. Pleural fluid studies with cholesterol 58, LDH 72 (serum LDH 117), total protein 4 (serum total protein 6.5), glucose 104, total nucleated cells 2322, and triglycerides 58, which was consistent with an exudative effusion. Etiology thought to be secondary to pneumonitis from chronic aspiration. Patient now presenting with 2 months of progressively worsening shortness of breath and hypoxia to SpO2 80s when seen by her outpatient cardiologist, prompting referral to ED. Initial CXR with stable large left pleural effusion. She underwent 1.5L thoracentesis with PCCM today (6/12), pleural fluid studies pending. Will hold Eliquis  for now since she may need repeat thoracentesis. Will likely obtain CT Chest while hospitalized to better evaluate lung parenchyma once pleural effusion is completely evacuated. - PCCM consulted; appreciate recs - Follow-up pleural fluid studies - Hold Eliquis  for possible repeat thoracentesis - Consider CT Chest after pleural effusion drained - Telemetry   #Hypoxic respiratory failure  Hypoxia likely secondary to large pleural effusion since she is now stable (SpO2 96%) on room air after thoracentesis. Do not think patient is experiencing heart failure exacerbation even though BNP elevated at 219.8 on admission since she has no crackles, JVD, or LE edema on exam. Also do not think she has  pneumonia (no fevers, chills, cough) or PE (dyspnea was not acute, patient has been hemodynamically stable). Will continue treating pleural effusion as above. - Ambulatory pulse ox prior to discharge - Supplemental oxygen  for SpO2 <92%   #Biventricular heart failure Patient is euvolemic on exam. EF 40-45% on most recent echocardiogram, which is actually improved from 30-35% in 12/2022. She takes Jardiance , metoprolol , and furosemide  at home, which we will continue while hospitalized.  - Home metoprolol  succinate 50 mg daily - Home Jardiance  10mg  daily - Home furosemide  20 mg daily   #Esophageal dysmotility Patient has a history of chronic dysphagia in the setting of severe esophageal dysmotility. SLP consulted this admission and has signed off after no signs of oropharyngeal dysphagia on evaluation. They recommended advancing diet to mechanical soft (dysphagia 3).  - Advance to mechanical soft (dysphagia 3) diet - Aspiration precautions   #Falls Patient reports recent fall on 04/23/2024 from dizziness, possibly due to orthostatic hypotension or permanent Afib. Will have PT and OT evaluate while hospitalized. Will also check orthostatic vital signs. - PT/OT consult - Orthostatic vital signs   #Chronic atrial fibrillation Patient remains in atrial fibrillation but is stable with normal heart rate. She takes metoprolol  succinate 50mg  daily and Eliquis  5 mg BID at home. Will continue metoprolol  while hospitalized but hold Eliquis  for now pending repeat thoracentesis. - Home metoprolol  succinate 50 mg daily - Hold Eliquis  5 mg BID for possible repeat thoracentesis   #CKD stage III Creatinine 1.25 on admission, which is slightly up from baseline of 0.9-1.0. Will continue to monitor. - Repeat BMP tomorrow AM   #T2DM Blood glucose stable. Most recent Hgb A1c 5.3%. Will continue home Jardiance  while hospitalized. Will defer starting SSI for now. - Jardiance  10 mg daily   #Gout - Home allopurinol  100mg  daily.   LOS: 0 days   Elmira Haddock, Medical Student 04/28/2024, 1:38 PM

## 2024-04-28 NOTE — Procedures (Signed)
 Thoracentesis  Procedure Note  HADEN CAVENAUGH  [id number]  23-Jun-1933  Date:04/28/24  Time:12:19 PM   Provider Performing:Anahid Eskelson V. Alithia Zavaleta   Procedure: Thoracentesis with imaging guidance (40981)  Indication(s) Pleural Effusion  Consent Risks of the procedure as well as the alternatives and risks of each were explained to the patient and/or caregiver.  Consent for the procedure was obtained and is signed in the bedside chart  Anesthesia Topical only with 1% lidocaine     Time Out Verified patient identification, verified procedure, site/side was marked, verified correct patient position, special equipment/implants available, medications/allergies/relevant history reviewed, required imaging and test results available.   Sterile Technique Maximal sterile technique including full sterile barrier drape, hand hygiene, sterile gown, sterile gloves, mask, hair covering, sterile ultrasound probe cover (if used).  Procedure Description Ultrasound was used to identify appropriate pleural anatomy for placement and overlying skin marked.  Area of drainage cleaned and draped in sterile fashion. Lidocaine  was used to anesthetize the skin and subcutaneous tissue.  1500 cc's of amber yellow appearing fluid was drained from the left pleural space. Catheter then removed and bandaid applied to site.   Complications/Tolerance None; patient tolerated the procedure well. Chest X-ray is ordered to confirm no post-procedural complication.   EBL Minimal   Specimen(s) Pleural fluid  Jett Kulzer V. Villa Greaser MD

## 2024-04-28 NOTE — TOC CM/SW Note (Addendum)
 Transition of Care [facility]) - Inpatient Brief Assessment   Patient Details  Name: Anne Roach MRN: [id number] Date of Birth: February 18, 1933  [facility] CM/SW Contact:    Jennett Model, RN Phone Number: 04/28/2024, 4:13 PM   Clinical Narrative: From home with daughter, has PCP and insurance on file, states has services  with [facility] right now in place, has walker,cane, bsc, shower chair, scooter  at home.  Family member will transport them home at [employer] and family is support system, gets medications from [place] on [address].  Pta self ambulatory with walker.  She has a scale at home. NCM tried to confirm with Randel Buss with [facility] the services she has with them, he will call this NCM back. Randel Buss is able to take referral  for HHPT, HHOT,  soc will begin 24 to 48 hrs post dc.    Transition of Care Asessment: Insurance and Status: Insurance coverage has been reviewed Patient has primary care physician: Yes Home environment has been reviewed: lives with daughter Prior level of function:: ambulatory with walker Prior/Current Home Services: Current home services (walker,cane, bsc, shower chair, scooter) Social Drivers of Health Review: SDOH reviewed no interventions necessary Readmission risk has been reviewed: Yes Transition of care needs: transition of care needs identified, TOC will continue to follow

## 2024-04-28 NOTE — ED Notes (Signed)
 Pt ambulated to bathroom and back to room with assistance from this RN. Pt placed back on monitor. No other needs voiced at this time. Call light within reach

## 2024-04-28 NOTE — Consult Note (Signed)
 NAME:  Anne Roach, MRN:  [id number], DOB:  03-Dec-1932, LOS: 0 ADMISSION DATE:  04/27/2024, CONSULTATION DATE: 04/27/2024 REFERRING MD: Sedation, CHIEF COMPLAINT: Shortness of breath with large left pleural effusion  History of Present Illness:  89 year old female who has obstructive sleep apnea noncompliant with CPAP and has also well-documented comorbidities as listed below.  She presents with increasing shortness of breath in March she had 1.4 L tapped for left-sided [facility].  She again presents with symptoms chest x-ray shows large left pleural effusion.  Pulmonary critical care asked to evaluate for possible thoracentesis.  Pertinent  Medical History   Past Medical History:  Diagnosis Date   A-fib [facility])    Acute on chronic diastolic CHF (congestive heart failure) (HCC) 03/27/2022   Anemia    Arthritis    Atrial fibrillation (HCC)    Atrial fibrillation with RVR (HCC) 03/20/2017   Cancer (HCC)    colon cancer   CAP (community acquired pneumonia) 01/25/2024   Chronic diastolic CHF (congestive heart failure) (HCC) 10/31/2020   DM (diabetes mellitus) (HCC)    HTN (hypertension)    Occasional tremors    on Inderal  for this   Sepsis secondary to UTI (HCC) 07/11/2020   Sleep apnea    not used cpap in a long time per daughter   Tremor      Significant Hospital Events: Including procedures, antibiotic start and stop dates in addition to other pertinent events     Interim History / Subjective:  Weanable shortness of breath  Objective    Blood pressure (!) 118/91, pulse 77, temperature 97.9 F (36.6 C), resp. rate (!) 22, height 5' 5 (1.651 m), weight 65.8 kg, SpO2 96%.       No intake or output data in the 24 hours ending 04/28/24 0944 Filed Weights   04/27/24 1239  Weight: 65.8 kg    Examination: General: Well-nourished well-developed female no acute distress at rest HENT: No JVD is appreciated Lungs: Creased breath sounds left side of one half down to  also dull to percussion one half down Cardiovascular: Sounds are regular Abdomen: Soft nontender Extremities: Warm dry without edema Neuro: Grossly intact but somewhat of a language barrier GU: Primary urine  Resolved problem list   Assessment and Plan  Left pleural effusion recurrent in the setting of congestive heart failure.  Atrial fibrillation treated with Eliquis .  Chronic kidney disease type III.  Esophageal dysmotility.  She presented to cardiology's office with shortness of breath going on for the last 2 months as noted March [facility] with improvement in shortness of breath. Pulmonary critical care is asked to evaluate for possible thoracentesis Chest x-ray shows large left pleural effusion Anticoagulants on hold last dose of Eliquis  was 04/26/2024 in a.m. Patient and her family are amenable to thoracentesis Plan on thoracentesis sometime today May consider CT of the chest postthoracentesis for evaluation of opacities No evidence of infectious   Congestive heart failure permanent atrial fibrillation Per cardiology  Gout  continue allopurinol    OSA Noncompliant with CPAP  Best Practice (right click and Reselect all SmartList Selections daily)   Diet/type: Regular consistency (see orders) DVT prophylaxis DOAC Pressure ulcer(s): N/A GI prophylaxis: PPI Lines: N/A Foley:  N/A Code Status:  full code Last date of multidisciplinary goals of care discussion [tbd]  Labs   CBC: Recent Labs  Lab 04/27/24 1300  WBC 6.2  NEUTROABS 3.9  HGB 13.6  HCT 43.0  MCV  97.5  PLT 172    Basic Metabolic Panel: Recent Labs  Lab 04/27/24 1300  NA 143  K 4.3  CL 104  CO2 27  GLUCOSE 149*  BUN 17  CREATININE 1.25*  CALCIUM 9.0   GFR: Estimated Creatinine Clearance: 27.5 mL/min (A) (by C-G formula based on SCr of 1.25 mg/dL (H)). Recent Labs  Lab 04/27/24 1300  WBC 6.2    Liver Function Tests: Recent Labs  Lab  04/27/24 1300  AST 18  ALT 10  ALKPHOS 50  BILITOT 0.7  PROT 7.4  ALBUMIN 3.4*   No results for input(s): LIPASE, AMYLASE in the last 168 hours. No results for input(s): AMMONIA in the last 168 hours.  ABG    Component Value Date/Time   TCO2 22 11/23/2022 1033     Coagulation Profile: No results for input(s): INR, PROTIME in the last 168 hours.  Cardiac Enzymes: No results for input(s): CKTOTAL, CKMB, CKMBINDEX, TROPONINI in the last 168 hours.  HbA1C: Hgb A1c MFr Bld  Date/Time Value Ref Range Status  09/30/2023 08:47 AM 5.3 4.8 - 5.6 % Final    Comment:    (NOTE) Pre diabetes:          5.7%-6.4%  Diabetes:              >6.4%  Glycemic control for   <7.0% adults with diabetes   03/27/2022 12:44 PM 6.1 (H) 4.8 - 5.6 % Final    Comment:    (NOTE) Pre diabetes:          5.7%-6.4%  Diabetes:              >6.4%  Glycemic control for   <7.0% adults with diabetes     CBG: No results for input(s): GLUCAP in the last 168 hours.  Review of Systems:   .smros    Past Medical History:  She,  has a past medical history of A-fib (HCC), Acute on chronic diastolic CHF (congestive heart failure) (HCC) (03/27/2022), Anemia, Arthritis, Atrial fibrillation (HCC), Atrial fibrillation with RVR (HCC) (03/20/2017), Cancer (HCC), CAP (community acquired pneumonia) (01/25/2024), Chronic diastolic CHF (congestive heart failure) (HCC) (10/31/2020), DM (diabetes mellitus) (HCC), HTN (hypertension), Occasional tremors, Sepsis secondary to UTI (HCC) (07/11/2020), Sleep apnea, and Tremor.   Surgical History:   Past Surgical History:  Procedure Laterality Date   APPENDECTOMY     BIOPSY  12/23/2022   Procedure: BIOPSY;  Surgeon: Genell Ken, MD;  Location: [facility];  Service: Gastroenterology;;   BIOPSY  08/03/2023   Procedure: BIOPSY;  Surgeon: Genell Ken, MD;  Location: [facility];  Service: Gastroenterology;;   CARPAL TUNNEL RELEASE     CATARACT  EXTRACTION Bilateral    COLONOSCOPY WITH PROPOFOL  N/A 08/03/2023   Procedure: COLONOSCOPY WITH PROPOFOL ;  Surgeon: Genell Ken, MD;  Location: [facility];  Service: Gastroenterology;  Laterality: N/A;   ESOPHAGEAL DILATION  12/23/2022   Procedure: ESOPHAGEAL DILATION;  Surgeon: Genell Ken, MD;  Location: [facility];  Service: Gastroenterology;;   ESOPHAGOGASTRODUODENOSCOPY (EGD) WITH PROPOFOL  N/A 12/23/2022   Procedure: ESOPHAGOGASTRODUODENOSCOPY (EGD) WITH PROPOFOL  W/ DILATION;  Surgeon: Genell Ken, MD;  Location: [facility];  Service: Gastroenterology;  Laterality: N/A;  with dilation   HERNIA REPAIR     LAPAROSCOPIC RIGHT HEMI COLECTOMY Right 10/08/2023   Procedure: LAPAROSCOPIC RIGHT HEMICOLECTOMY;  Surgeon: Melvenia Stabs, MD;  Location: [facility];  Service: General;  Laterality: Right;   POLYPECTOMY  08/03/2023   Procedure: POLYPECTOMY;  Surgeon: Genell Ken, MD;  Location:  [facility];  Service: Gastroenterology;;   tarsal tunnel surgery        Social History:   reports that she has never smoked. She has never been exposed to tobacco smoke. She has never used smokeless tobacco. She reports that she does not drink alcohol and does not use drugs.   Family History:  Her family history includes Asthma in her brother; Cancer (age of onset: 84) in her daughter; Diabetes in her father.   Allergies No Known Allergies   Home Medications  Prior to Admission medications   Medication Sig Start Date End Date Taking? Authorizing Provider  acetaminophen  (TYLENOL ) 500 MG tablet Take 1,000 mg by mouth every 6 (six) hours as needed for mild pain or headache.   Yes [provider]  allopurinol  (ZYLOPRIM ) 100 MG tablet TAKE 1 TABLET(100 MG) BY MOUTH DAILY Patient taking differently: Take 100 mg by mouth every evening. 08/09/21  Yes Dot Gazella, DPM  Cholecalciferol  (VITAMIN D ) 125 MCG (5000 UT) CAPS Take 5,000 Units by mouth every evening.   Yes [provider]   diclofenac  Sodium (VOLTAREN ) 1 % GEL Apply 4 g topically 4 (four) times daily. Patient taking differently: Apply 4 g topically daily as needed (for pain). 11/29/23  Yes Albertus Hughs, DO  ELIQUIS  5 MG TABS tablet TAKE 1 TABLET(5 MG) BY MOUTH TWICE DAILY 02/19/24  Yes Loyde Rule, MD  empagliflozin  (JARDIANCE ) 10 MG TABS tablet TAKE 1 TABLET(10 MG) BY MOUTH DAILY 12/24/23  Yes Nishan, Peter C, MD  folic acid  (FOLVITE ) 800 MCG tablet Take 800 mcg by mouth daily.   Yes [provider]  furosemide  (LASIX ) 20 MG tablet Take 1 tablet (20 mg total) by mouth daily. 11/25/23  Yes Gerald Kitty., NP  gabapentin  (NEURONTIN ) 400 MG capsule Take 400 mg by mouth 3 (three) times daily. 03/25/22  Yes [provider]  ipratropium (ATROVENT ) 0.06 % nasal spray Place 2 sprays into both nostrils 2 (two) times daily as needed (nasal drainage). 02/17/24 04/27/24 Yes Reynold Caves, MD  Iron-FA-B Cmp-C-Biot-Probiotic (FUSION PLUS) CAPS Take 1 capsule by mouth every other day. 08/26/23  Yes [provider]  metoprolol  succinate (TOPROL  XL) 50 MG 24 hr tablet Take 1 tablet (50 mg total) by mouth daily. Take with or immediately following a meal. 11/25/23  Yes Dick, Karon Packer., NP  Multiple Vitamins-Minerals (ICAPS AREDS 2 PO) Take 1 capsule by mouth 2 (two) times daily.   Yes [provider]  ondansetron  (ZOFRAN -ODT) 4 MG disintegrating tablet Take 4 mg by mouth every 8 (eight) hours as needed for nausea or vomiting. 12/26/23  Yes [provider]  pantoprazole  (PROTONIX ) 40 MG tablet Take 40 mg by mouth every morning. 03/27/24  Yes [provider]  Potassium Chloride  ER 20 MEQ TBCR Take 20 mEq by mouth daily. 04/09/22  Yes Clegg, Amy D, NP  ACCU-CHEK AVIVA PLUS test strip  10/06/20   [provider]  amLODipine  (NORVASC ) 2.5 MG tablet Take 1 tablet (2.5 mg total) by mouth daily. 01/28/24 02/28/24  Rizwan, Saima, MD  loratadine  (CLARITIN ) 10 MG tablet Take 10 mg by mouth daily as  needed for allergies. Patient not taking: Reported on 03/23/2024 08/28/23   [provider]     Critical care time: Bonnee Bustle Virda Betters ACNP Acute Care Nurse Practitioner Jonny Neu Pulmonary/Critical Care Please consult Amion 04/28/2024, 9:44 AM

## 2024-04-28 NOTE — Evaluation (Signed)
 Physical Therapy Evaluation Patient Details Name: Anne Roach MRN: [id number] DOB: February 22, 1933 Today's Date: 04/28/2024  History of Present Illness  89 y.o. female referred to the ED by her cardiologist after echo resulted in reduced LV ejection fraction of 40-45% and hypoxia with O2 sat of 80%. Large Left pleural effusion; 6/12 thoracentesis PMH chronic pleural effusions, DM, A fib on Eliquis , OSA, CKD 3, HTN, colorectal Ca with partial colectomy, macular degeneration, chronic pain, CHF.  Clinical Impression   Pt admitted secondary to problem above with deficits below. PTA patient lives with daughter in multi-level home however she stays on the main level. She walks with a rollator at all times. She has had 2 falls in the past 3 months.  Pt currently is very sleepy/lethargic after thoracentesis (and states she could not sleep last night). She required min assist for bed mobility, CGA for sit to stand and was unable to progress to walking due to fatigue and returning to sitting. Due to limited evaluation, will err on side of caution and recommend HHPT (especially with recent falls).  Anticipate patient will benefit from PT to address problems listed below. Will continue to follow acutely to maximize functional mobility, independence, and safety.           If plan is discharge home, recommend the following: A little help with walking and/or transfers;A little help with bathing/dressing/bathroom;Assistance with cooking/housework;Assist for transportation;Help with stairs or ramp for entrance   Can travel by private vehicle        Equipment Recommendations None recommended by PT  Recommendations for Other Services       Functional Status Assessment Patient has had a recent decline in their functional status and demonstrates the ability to make significant improvements in function in a reasonable and predictable amount of time.     Precautions / Restrictions Precautions Precautions:  Fall Recall of Precautions/Restrictions: Intact Precaution/Restrictions Comments: reports fell 2 days ago due to dizziness/imbalance      Mobility  Bed Mobility Overal bed mobility: Needs Assistance Bed Mobility: Supine to Sit, Sit to Supine     Supine to sit: Min assist Sit to supine: Contact guard assist   General bed mobility comments: on ED stretcher; assist to raise torso and pivot to EOB; able to raise legs back onto bed with guarding assist    Transfers Overall transfer level: Needs assistance Equipment used: None Transfers: Sit to/from Stand Sit to Stand: Contact guard assist           General transfer comment: stands for brief period and returns to sitting due to tired/sleepy    Ambulation/Gait               General Gait Details: pt refused/unable due to fatigue  Stairs            Wheelchair Mobility     Tilt Bed    Modified Rankin (Stroke Patients Only)       Balance Overall balance assessment: Needs assistance, History of Falls Sitting-balance support: No upper extremity supported, Feet supported Sitting balance-Leahy Scale: Good Sitting balance - Comments: able to figure 4 sit to doff socks   Standing balance support: No upper extremity supported Standing balance-Leahy Scale: Fair Standing balance comment: could not stand long enough for additional balance assessment                             Pertinent Vitals/Pain Pain Assessment Pain Assessment: No/denies pain  Home Living Family/patient expects to be discharged to:: Private residence Living Arrangements: Children Available Help at Discharge: Family;Available PRN/intermittently;Personal care attendant (8:30 a-1100 a) Type of Home: House Home Access: Stairs to enter;Ramped entrance Entrance Stairs-Rails: Right;Left Entrance Stairs-Number of Steps: 5   Home Layout: Multi-level;Able to live on main level with bedroom/bathroom Home Equipment: Rolling Walker (2  wheels);Grab bars - tub/shower;Shower seat - built in;BSC/3in1;Rollator (4 wheels);Hand held shower head;Other (comment);Electric scooter;Transport chair;Lift chair Additional Comments: Life Alert (but doesn't use it), HHA-M, Tu, Th, Fr, and every other Sat for about 2.5 hours/day. Daughter reports pt has adjustable bed but prefers flat bed.  Dtr home with pt on Wednesdays. Daughter is a professor and also works at [employer] in [employer]    Prior Function Prior Level of Function : Needs assist;History of Falls (last six months)             Mobility Comments: walks with rollator; 1 fall in March and 1 PTA ADLs Comments: per MEDICAL RECORD NUMBERsupv/A as needed with bathing, pt performs toileting and dressing; aide assists with bathing and household chores - Pt ENJOYS cooking and likes to help in the kitchen     Extremity/Trunk Assessment   Upper Extremity Assessment Upper Extremity Assessment: Difficult to assess due to impaired cognition (moving BUEs within functional ROM and assisting with bed mobility.)    Lower Extremity Assessment Lower Extremity Assessment: Defer to PT evaluation    Cervical / Trunk Assessment Cervical / Trunk Assessment: Kyphotic  Communication   Communication Communication: No apparent difficulties    Cognition Arousal: Obtunded Behavior During Therapy: Flat affect   PT - Cognitive impairments: Difficult to assess Difficult to assess due to: Level of arousal                       Following commands: Impaired Following commands impaired: Follows one step commands with increased time     Cueing Cueing Techniques: Verbal cues, Tactile cues, Gestural cues     General Comments General comments (skin integrity, edema, etc.): On 4L sats 97% on arrival (although chart documentation states on RA). Changed to RA and maintained sats 91-95% during activity. HR 110s    Exercises     Assessment/Plan    PT Assessment Patient needs continued PT services  PT  Problem List Decreased strength;Decreased activity tolerance;Decreased balance;Decreased mobility;Decreased knowledge of use of DME;Decreased safety awareness;Cardiopulmonary status limiting activity       PT Treatment Interventions DME instruction;Gait training;Functional mobility training;Therapeutic activities;Therapeutic exercise;Balance training;Patient/family education    PT Goals (Current goals can be found in the Care Plan section)  Acute Rehab PT Goals Patient Stated Goal: unable to state PT Goal Formulation: With patient Time For Goal Achievement: 05/12/24 Potential to Achieve Goals: Good    Frequency Min 2X/week     Co-evaluation PT/OT/SLP Co-Evaluation/Treatment: Yes Reason for Co-Treatment: For patient/therapist safety;Other (comment) (pt limited activity tolerance) PT goals addressed during session: Mobility/safety with mobility;Balance         AM-PAC PT 6 Clicks Mobility  Outcome Measure Help needed turning from your back to your side while in a flat bed without using bedrails?: A Little Help needed moving from lying on your back to sitting on the side of a flat bed without using bedrails?: A Little Help needed moving to and from a bed to a chair (including a wheelchair)?: A Little Help needed standing up from a chair using your arms (e.g., wheelchair or bedside chair)?: A Little Help needed to  walk in hospital room?: Total (<20 ft) Help needed climbing 3-5 steps with a railing? : Total 6 Click Score: 14    End of Session   Activity Tolerance: Patient limited by fatigue;Patient limited by lethargy Patient left: in bed;with call bell/phone within reach;Other (comment) (transporter present to transfer pt)   PT Visit Diagnosis: Unsteadiness on feet (R26.81);Repeated falls (R29.6);Muscle weakness (generalized) (M62.81);Difficulty in walking, not elsewhere classified (R26.2)    Time: 1610-9604 PT Time Calculation (min) (ACUTE ONLY): 23 min   Charges:   PT  Evaluation $PT Eval Low Complexity: 1 Low   PT General Charges $$ ACUTE PT VISIT: 1 Visit          Gayle Kava, PT Acute Rehabilitation Services  Office [phone]   Guilford Leep 04/28/2024, 3:00 PM

## 2024-04-28 NOTE — Evaluation (Signed)
 Occupational Therapy Evaluation Patient Details Name: Anne Roach MRN: [id number] DOB: Jun 27, 1933 Today's Date: 04/28/2024   History of Present Illness   89 y.o. female referred to the ED by her cardiologist after echo resulted in reduced LV ejection fraction of 40-45% and hypoxia with O2 sat of 80%. Large Left pleural effusion; 6/12 thoracentesis PMH chronic pleural effusions, DM, A fib on Eliquis , OSA, CKD 3, HTN, colorectal Ca with partial colectomy, macular degeneration, chronic pain, CHF.     Clinical Impressions Pt admitted for above, PTA Pt had assist with bathing/dressing from an aide, walks in home with rollator and enjoys cooking. Today's session limited by fatigue/lethargy, pt with hard time maintaining attention to therapist. She was able to complete STS with CGA and take steps standing EOB, needed min A for bed mobility. OT to continue following pt while in acute stay, will make efforts to see tomorrow given today's limitations. Suspect pt would be safe to return home with [facility] services pending more therapy sessions to determine how close she is to baseline once she is more alert.      If plan is discharge home, recommend the following:   A little help with bathing/dressing/bathroom;Assistance with cooking/housework     Functional Status Assessment   Patient has had a recent decline in their functional status and demonstrates the ability to make significant improvements in function in a reasonable and predictable amount of time.     Equipment Recommendations   None recommended by OT     Recommendations for Other Services         Precautions/Restrictions   Precautions Precautions: Fall Recall of Precautions/Restrictions: Intact Precaution/Restrictions Comments: reports fell 2 days ago due to dizziness/imbalance Restrictions Weight Bearing Restrictions Per Provider Order: No     Mobility Bed Mobility Overal bed mobility: Needs Assistance Bed  Mobility: Sit to Supine, Supine to Sit     Supine to sit: Min assist Sit to supine: Contact guard assist   General bed mobility comments: increased time for bed mobility. Min A to assist with BLE initiation of bed mobility and min A raising trunk, cues to sequence.    Transfers Overall transfer level: Needs assistance Equipment used: 2 person hand held assist Transfers: Sit to/from Stand Sit to Stand: Contact guard assist, +2 safety/equipment           General transfer comment: CGA +2 for safety, pt very fatigued. Took steps towards [facility] with CGA +2      Balance Overall balance assessment: Needs assistance Sitting-balance support: Feet unsupported, No upper extremity supported Sitting balance-Leahy Scale: Fair Sitting balance - Comments: On ED stretcher     Standing balance-Leahy Scale: Fair                             ADL either performed or assessed with clinical judgement   ADL Overall ADL's : Needs assistance/impaired Eating/Feeding: Bed level;Independent   Grooming: Sitting;Contact guard assist   Upper Body Bathing: Sitting;Contact guard assist   Lower Body Bathing: Sitting/lateral leans;Contact guard assist   Upper Body Dressing : Sitting;Set up   Lower Body Dressing: Contact guard assist;Sitting/lateral leans Lower Body Dressing Details (indicate cue type and reason): doffed bilat socks with figure four technique. Toilet Transfer: Minimal assistance;+2 for physical assistance Toilet Transfer Details (indicate cue type and reason): able to take steps with min A+2, limited by fatigue. Toileting- Architect and Hygiene: Sit to/from stand;Moderate assistance  Functional mobility during ADLs: Minimal assistance;+2 for safety/equipment;+2 for physical assistance (standing at bedside)       Vision         Perception         Praxis         Pertinent Vitals/Pain       Extremity/Trunk Assessment Upper Extremity  Assessment Upper Extremity Assessment: Difficult to assess due to impaired cognition (moving BUEs within functional ROM and assisting with bed mobility.)   Lower Extremity Assessment Lower Extremity Assessment: Defer to PT evaluation   Cervical / Trunk Assessment Cervical / Trunk Assessment: Kyphotic   Communication Communication Communication: No apparent difficulties   Cognition Arousal: Lethargic Behavior During Therapy: Flat affect Cognition: Difficult to assess Difficult to assess due to: Level of arousal           OT - Cognition Comments: pt likely impacted by fatigue, demonstrating decreased processing                 Following commands: Impaired Following commands impaired: Follows one step commands with increased time     Cueing  General Comments   Cueing Techniques: Verbal cues;Gestural cues  Transport at pt's room in preparation for transition to 3E. Changed to RA and maintained sats 91-95% during activity. HR 110s   Exercises     Shoulder Instructions      Home Living Family/patient expects to be discharged to:: Private residence Living Arrangements: Children Available Help at Discharge: Family;Available PRN/intermittently;Personal care attendant (8:30 a-1100 a) Type of Home: House Home Access: Stairs to enter;Ramped entrance Entrance Stairs-Number of Steps: 5 Entrance Stairs-Rails: Right;Left Home Layout: Multi-level;Able to live on main level with bedroom/bathroom     Bathroom Shower/Tub: Arts development officer Toilet: Handicapped height Bathroom Accessibility: Yes   Home Equipment: Agricultural consultant (2 wheels);Grab bars - tub/shower;Shower seat - built in;BSC/3in1;Rollator (4 wheels);Hand held shower head;Other (comment);Electric scooter;Transport chair;Lift chair   Additional Comments: Life Alert (but doesn't use it), HHA-M, Tu, Th, Fr, and every other Sat for about 2.5 hours/day. Daughter reports pt has adjustable bed but prefers flat  bed.  Dtr home with pt on Wednesdays. Daughter is a professor and also works at [employer] in [employer]      Prior Functioning/Environment Prior Level of Function : Needs assist;History of Falls (last six months)             Mobility Comments: walks with rollator; 1 fall in March and 1 PTA ADLs Comments: per MEDICAL RECORD NUMBERsupv/A as needed with bathing, pt performs toileting and dressing; aide assists with bathing and household chores - Pt ENJOYS cooking and likes to help in the kitchen    OT Problem List: Cardiopulmonary status limiting activity   OT Treatment/Interventions: Self-care/ADL training;Balance training;DME and/or AE instruction;Therapeutic activities      OT Goals(Current goals can be found in the care plan section)   Acute Rehab OT Goals Patient Stated Goal: none stated OT Goal Formulation: Patient unable to participate in goal setting Time For Goal Achievement: 05/12/24 Potential to Achieve Goals: Good ADL Goals Pt Will Perform Grooming: with contact guard assist;standing Pt Will Perform Lower Body Dressing: with contact guard assist;sit to/from stand Pt Will Transfer to Toilet: with supervision;ambulating Pt Will Perform Toileting - Clothing Manipulation and hygiene: with supervision;sit to/from stand   OT Frequency:  Min 2X/week    Co-evaluation   Reason for Co-Treatment: For patient/therapist safety;Other (comment) (pt limited activity tolerance) PT goals addressed during session: Mobility/safety with mobility;Balance  AM-PAC OT 6 Clicks Daily Activity     Outcome Measure Help from another person eating meals?: None Help from another person taking care of personal grooming?: A Little Help from another person toileting, which includes using toliet, bedpan, or urinal?: A Lot Help from another person bathing (including washing, rinsing, drying)?: A Little Help from another person to put on and taking off regular upper body clothing?: A Little Help  from another person to put on and taking off regular lower body clothing?: A Little 6 Click Score: 18   End of Session Nurse Communication: Mobility status  Activity Tolerance: Patient limited by fatigue;Patient limited by lethargy Patient left: in bed;with call bell/phone within reach  OT Visit Diagnosis: Other (comment) (SOB)                Time: 8657-8469 OT Time Calculation (min): 9 min Charges:  OT General Charges $OT Visit: 1 Visit  04/28/2024  AB, OTR/L  Acute Rehabilitation Services  Office: [phone]   Jorene New 04/28/2024, 3:09 PM

## 2024-04-29 ENCOUNTER — Observation Stay (HOSPITAL_COMMUNITY)

## 2024-04-29 ENCOUNTER — Encounter (HOSPITAL_COMMUNITY)
Admission: EM | Disposition: A | Discharge status: Home / Self Care | Payer: Self-pay | Source: Home / Self Care | Attending: Emergency Medicine

## 2024-04-29 ENCOUNTER — Encounter (HOSPITAL_COMMUNITY): Payer: Self-pay | Admitting: Internal Medicine

## 2024-04-29 DIAGNOSIS — N1831 Chronic kidney disease, stage 3a: Secondary | ICD-10-CM

## 2024-04-29 DIAGNOSIS — R0602 Shortness of breath: Secondary | ICD-10-CM | POA: Diagnosis not present

## 2024-04-29 DIAGNOSIS — I5022 Chronic systolic (congestive) heart failure: Secondary | ICD-10-CM | POA: Diagnosis not present

## 2024-04-29 DIAGNOSIS — N183 Chronic kidney disease, stage 3 unspecified: Secondary | ICD-10-CM | POA: Diagnosis not present

## 2024-04-29 DIAGNOSIS — I4891 Unspecified atrial fibrillation: Secondary | ICD-10-CM | POA: Diagnosis not present

## 2024-04-29 DIAGNOSIS — J90 Pleural effusion, not elsewhere classified: Secondary | ICD-10-CM | POA: Diagnosis not present

## 2024-04-29 HISTORY — PX: THORACENTESIS: SHX235

## 2024-04-29 LAB — PROTEIN, PLEURAL OR PERITONEAL FLUID: Total protein, fluid: 4.4 g/dL

## 2024-04-29 LAB — BASIC METABOLIC PANEL WITH GFR
Anion gap: 9 (ref 5–15)
BUN: 19 mg/dL (ref 8–23)
CO2: 28 mmol/L (ref 22–32)
Calcium: 8.6 mg/dL — ABNORMAL LOW (ref 8.9–10.3)
Chloride: 105 mmol/L (ref 98–111)
Creatinine, Ser: 1.08 mg/dL — ABNORMAL HIGH (ref 0.44–1.00)
GFR, Estimated: 49 mL/min — ABNORMAL LOW (ref >60)
Glucose, Bld: 105 mg/dL — ABNORMAL HIGH (ref 70–99)
Potassium: 3.8 mmol/L (ref 3.5–5.1)
Sodium: 142 mmol/L (ref 135–145)

## 2024-04-29 LAB — BODY FLUID CELL COUNT WITH DIFFERENTIAL
Eos, Fluid: 0 %
Lymphs, Fluid: 77 %
Monocyte-Macrophage-Serous Fluid: 11 % — ABNORMAL LOW (ref 50–90)
Neutrophil Count, Fluid: 12 % (ref 0–25)
Total Nucleated Cell Count, Fluid: 1214 uL — ABNORMAL HIGH (ref 0–1000)

## 2024-04-29 LAB — GRAM STAIN

## 2024-04-29 LAB — MISC LABCORP TEST (SEND OUT)

## 2024-04-29 LAB — GLUCOSE, PLEURAL OR PERITONEAL FLUID: Glucose, Fluid: 128 mg/dL

## 2024-04-29 LAB — LACTATE DEHYDROGENASE, PLEURAL OR PERITONEAL FLUID: LD, Fluid: 75 U/L — ABNORMAL HIGH (ref 3–23)

## 2024-04-29 LAB — CYTOLOGY - NON PAP

## 2024-04-29 LAB — MAGNESIUM: Magnesium: 2.2 mg/dL (ref 1.7–2.4)

## 2024-04-29 LAB — TRIGLYCERIDES, BODY FLUIDS: Triglycerides, Fluid: 55 mg/dL

## 2024-04-29 SURGERY — THORACENTESIS
Laterality: Right

## 2024-04-29 MED ORDER — POTASSIUM CHLORIDE CRYS ER 20 MEQ PO TBCR
40.0000 meq | EXTENDED_RELEASE_TABLET | Freq: Once | ORAL | Status: AC
  Administered 2024-04-29: 40 meq via ORAL
  Filled 2024-04-29: qty 2

## 2024-04-29 MED ORDER — ONDANSETRON HCL 4 MG/2ML IJ SOLN
4.0000 mg | Freq: Once | INTRAMUSCULAR | Status: AC
  Administered 2024-04-29: 4 mg via INTRAVENOUS
  Filled 2024-04-29: qty 2

## 2024-04-29 MED ORDER — APIXABAN 5 MG PO TABS
5.0000 mg | ORAL_TABLET | Freq: Two times a day (BID) | ORAL | Status: DC
  Administered 2024-04-29 – 2024-05-03 (×8): 5 mg via ORAL
  Filled 2024-04-29 (×8): qty 1

## 2024-04-29 MED ORDER — ACETAMINOPHEN 500 MG PO TABS
1000.0000 mg | ORAL_TABLET | Freq: Three times a day (TID) | ORAL | Status: DC | PRN
  Administered 2024-04-29 – 2024-05-02 (×4): 1000 mg via ORAL
  Filled 2024-04-29 (×4): qty 2

## 2024-04-29 MED ORDER — ONDANSETRON 4 MG PO TBDP
4.0000 mg | ORAL_TABLET | Freq: Three times a day (TID) | ORAL | Status: DC | PRN
  Administered 2024-04-29: 4 mg via ORAL
  Filled 2024-04-29: qty 1

## 2024-04-29 MED ORDER — KETOROLAC TROMETHAMINE 15 MG/ML IJ SOLN
7.5000 mg | Freq: Once | INTRAMUSCULAR | Status: AC
  Administered 2024-04-29: 7.5 mg via INTRAVENOUS
  Filled 2024-04-29 (×2): qty 1

## 2024-04-29 NOTE — Progress Notes (Signed)
 NAME:  Anne Roach, MRN:  [id number], DOB:  1933-03-05, LOS: 0 ADMISSION DATE:  04/27/2024, CONSULTATION DATE: 04/27/2024 REFERRING MD: Sedation, CHIEF COMPLAINT: Shortness of breath with large left pleural effusion  History of Present Illness:  89 year old female who has obstructive sleep apnea noncompliant with CPAP and has also well-documented comorbidities as listed below.  She presents with increasing shortness of breath in March she had 1.4 L tapped for [facility]-sided [facility].  She again presents with symptoms chest x-ray shows large left pleural effusion.  Pulmonary critical care asked to evaluate for possible thoracentesis.  Pertinent  Medical History   Past Medical History:  Diagnosis Date   A-fib [facility])    Acute on chronic diastolic CHF (congestive heart failure) (HCC) 03/27/2022   Anemia    Arthritis    Atrial fibrillation (HCC)    Atrial fibrillation with RVR (HCC) 03/20/2017   Cancer (HCC)    colon cancer   CAP (community acquired pneumonia) 01/25/2024   Chronic diastolic CHF (congestive heart failure) (HCC) 10/31/2020   DM (diabetes mellitus) (HCC)    HTN (hypertension)    Occasional tremors    on Inderal  for this   Sepsis secondary to UTI (HCC) 07/11/2020   Sleep apnea    not used cpap in a long time per daughter   Tremor      Significant Hospital Events: Including procedures, antibiotic start and stop dates in addition to other pertinent events   04/28/2024 thoracentesis left 1500 cc obtained  Interim History / Subjective:  Weanable shortness of breath  Objective    Blood pressure 129/65, pulse 78, temperature 98.9 F (37.2 C), temperature source Oral, resp. rate 18, height 5' 5 (1.651 m), weight 65.4 kg, SpO2 95%.       No intake or output data in the 24 hours ending 04/29/24 0950 Filed Weights   04/27/24 1239 04/29/24 0442  Weight: 65.8 kg 65.4 kg    Examination: Awake alert no acute distress complaining of pain in joints from her gout No  JVD or lymphadenopathy is appreciated Decreased breath sounds in the left base Heart sounds are distant Abdomen is obese soft nontender Remedies are warm and tender to manipulation Resolved problem list   Assessment and Plan  Left pleural effusion recurrent in the setting of congestive heart failure.  Atrial fibrillation treated with Eliquis .  Chronic kidney disease type III.  Esophageal dysmotility.  She presented to cardiology's office with shortness of breath going on for the last 2 months as noted March she [facility] with improvement in shortness of breath. Pulmonary critical care is asked to evaluate for possible thoracentesis Status post left thoracentesis for 1500 cc 04/28/2024 Plan for repeat thoracentesis 04/29/2024 with follow-up CT scan\ 04/29/2024 patient was updated at bedside using daughter as a Nurse, learning disability.   Congestive heart failure permanent atrial fibrillation Per cardiology  Gout  continue allopurinol  As needed Tylenol  for pain   OSA Noncompliant with CPAP  Best Practice (right click and Reselect all SmartList Selections daily)   Diet/type: Regular consistency (see orders) DVT prophylaxis DOAC Pressure ulcer(s): N/A GI prophylaxis: PPI Lines: N/A Foley:  N/A Code Status:  full code Last date of multidisciplinary goals of care discussion [tbd]  Labs   CBC: Recent Labs  Lab 04/27/24 1300  WBC 6.2  NEUTROABS 3.9  HGB 13.6  HCT 43.0  MCV 97.5  PLT 172    Basic Metabolic Panel: Recent Labs  Lab 04/27/24 1300 04/29/24 0249  NA 143 142  K 4.3 3.8  CL 104 105  CO2 27 28  GLUCOSE 149* 105*  BUN 17 19  CREATININE 1.25* 1.08*  CALCIUM 9.0 8.6*  MG  --  2.2   GFR: Estimated Creatinine Clearance: 31.8 mL/min (A) (by C-G formula based on SCr of 1.08 mg/dL (H)). Recent Labs  Lab 04/27/24 1300  WBC 6.2    Liver Function Tests: Recent Labs  Lab 04/27/24 1300  AST 18  ALT 10  ALKPHOS 50  BILITOT 0.7  PROT  7.4  ALBUMIN 3.4*   No results for input(s): LIPASE, AMYLASE in the last 168 hours. No results for input(s): AMMONIA in the last 168 hours.  ABG    Component Value Date/Time   TCO2 22 11/23/2022 1033     Coagulation Profile: No results for input(s): INR, PROTIME in the last 168 hours.  Cardiac Enzymes: No results for input(s): CKTOTAL, CKMB, CKMBINDEX, TROPONINI in the last 168 hours.  HbA1C: Hgb A1c MFr Bld  Date/Time Value Ref Range Status  09/30/2023 08:47 AM 5.3 4.8 - 5.6 % Final    Comment:    (NOTE) Pre diabetes:          5.7%-6.4%  Diabetes:              >6.4%  Glycemic control for   <7.0% adults with diabetes   03/27/2022 12:44 PM 6.1 (H) 4.8 - 5.6 % Final    Comment:    (NOTE) Pre diabetes:          5.7%-6.4%  Diabetes:              >6.4%  Glycemic control for   <7.0% adults with diabetes     CBG: No results for input(s): GLUCAP in the last 168 hours.    Siegfried Dress Milana Salay ACNP Acute Care Nurse Practitioner Jonny Neu Pulmonary/Critical Care Please consult Amion 04/29/2024, 9:50 AM

## 2024-04-29 NOTE — Progress Notes (Addendum)
 Subjective:  No significant overnight events. Dyspnea has somewhat improved after thoracentesis yesterday, but she still required supplemental oxygen overnight. Patient and daughter are both agreeable for repeat thoracentesis today. Patient would like to discontinue the oxygen as soon as she is ready because the tubing is restrictive.  Objective:  Vital signs in last 24 hours: Vitals:   04/29/24 0043 04/29/24 0442 04/29/24 0740 04/29/24 0820  BP: (!) 128/94 137/80 122/63 129/65  Pulse: 91 73 77 78  Resp: 17 19 17 18   Temp: 98.9 F (37.2 C) 98.3 F (36.8 C) 97.8 F (36.6 C) 98.9 F (37.2 C)  TempSrc: Oral Oral  Oral  SpO2: 92% 90% 99% 95%  Weight:  65.4 kg    Height:       Weight change: -0.363 kg  General: Older-appearing female sitting upright in hospital bed with daughter at bedside, appears comfortable and in NAD. HEENT: Normocephalic and atraumatic. Conjunctivae clear. Nasal cannula in place with patient occasionally sneezing/blowing nose.  Cardiovascular: Normal rate with irregular rhythm. No murmurs, rubs, or gallops. No LE edema.  Pulmonary: Right lung clear to auscultation. Left lung with decreased breath sounds throughout except upper lobe. No wheezes, rales, or rhonchi. Poor effort when asked to breathe deeply.  Neurological: Alert, responds to questions appropriately. Skin: Warm and dry.  Assessment/Plan:  Principal Problem:   Pleural effusion Active Problems:   Shortness of breath   Type 2 diabetes mellitus (HCC)   Chronic kidney disease (CKD), stage III (moderate)   Chronic atrial fibrillation (HCC)   Chronic HFrEF (heart failure with reduced ejection fraction) (HCC)   Gout   Esophageal dysmotility   Falls   Hypoxic respiratory failure (HCC)  Anne Roach is a 89 year-old female with PMH of HFimpEF (EF 40-45%), permanent Afib on Eliquis , CKD stage III, T2DM, esophageal dysmotility, and colorectal carcinoma s/p partial colectomy in 09/2023 who presented  for hypoxia and admitted on 04/28/2024 for large left pleural effusion.   #Left Pleural Effusion s/p 1.5L thoracentesis on 6/12 Patient remains on supplemental oxygen after undergoing 1.5L thoracentesis with PCCM on 6/12. Pleural fluid studies with LDH 73 (serum 123), total protein 4.9 (serum 7.4). albumin 2.7 (serum 3.4), TNC 969, amylase 43, and gram stain with few WBC. These results technically meet criteria for exudative effusion based on increased protein (ratio 0.66), though serum-effusion albumin gradient (1.25) suggests transudative effusion. Will repeat pleural fluid studies today with thoracentesis. Will also plan for CT Chest to evaluate lung parenchyma once pleural effusion has improved. - PCCM consulted; appreciate recs - Repeat thoracentesis with pleural studies today - Follow-up pleural fluid pH, cytology, triglycerides - CT Chest after pleural effusion improved - Hold Eliquis  for possible repeat thoracentesis - Telemetry   #Hypoxic respiratory failure  Hypoxia likely secondary to large pleural effusion since she her oxygen requirement has decreased after the thoracentesis. Do not think patient is experiencing heart failure exacerbation even though BNP elevated at 219.8 on admission since she has no crackles, JVD, or LE edema on exam. Also do not think she has pneumonia (no fevers, chills, cough) or PE (dyspnea was not acute, patient has been hemodynamically stable). Will continue treating pleural effusion as above. - Ambulatory pulse ox prior to discharge - Supplemental oxygen for SpO2 <92%   #Biventricular heart failure Patient is euvolemic on exam. EF 40-45% on most recent echocardiogram, which is actually improved from 30-35% in 12/2022. She takes Jardiance , metoprolol , and furosemide  at home, which we will continue while hospitalized.  - Home  metoprolol  succinate 50 mg daily - Home Jardiance  10mg  daily - Home furosemide  20 mg daily   #Esophageal dysmotility Patient has a  history of chronic dysphagia in the setting of severe esophageal dysmotility. SLP consulted this admission and has signed off after no signs of oropharyngeal dysphagia on evaluation. They recommended advancing diet to mechanical soft (dysphagia 3), which we will continue while hospitalized. - Mechanical soft (dysphagia 3) diet - Aspiration precautions   #Falls Patient reports recent fall on 04/23/2024 from dizziness, possibly due to orthostatic hypotension or permanent Afib. PT and OT evaluated patient and recommended home health. Will also check orthostatic vital signs. - Orthostatic vital signs  - HH PT/OT at discharge   #Chronic atrial fibrillation Patient remains in atrial fibrillation but is stable with normal to mildly tachycardic heart rate. She takes metoprolol  succinate 50mg  daily and Eliquis  5 mg BID at home. Will continue metoprolol  while hospitalized but hold Eliquis  for now pending repeat thoracentesis. - Home metoprolol  succinate 50 mg daily - Hold Eliquis  5 mg BID for possible repeat thoracentesis   #CKD stage III Creatinine down-trending (1.25 -> 1.08) towards baseline of 0.9-1.0. Will continue to monitor. - Repeat CMP tomorrow AM    #T2DM Blood glucose is stable. Most recent Hgb A1c 5.3%. Will continue home Jardiance  while hospitalized. Will defer starting SSI for now. - Home Jardiance  10 mg daily   #Gout - Home allopurinol  100mg  daily.   LOS: 0 days   Anne Roach, Medical Student 04/29/2024, 10:36 AM   Attestation for Student Documentation:  I personally was present and performed or re-performed the history, physical exam and medical decision-making activities of this service and have verified that the service and findings are accurately documented in the student's note.  Aurora Lees, DO 04/29/2024, 11:30 AM

## 2024-04-29 NOTE — Plan of Care (Signed)

## 2024-04-29 NOTE — Op Note (Signed)
 Thoracentesis  Procedure Note  Anne Roach  [id number]  09-04-1933  Date:04/29/24  Time:1:30 PM   Provider Performing:Toniann Dickerson Laneta Pintos Dione Franks   Procedure: Thoracentesis with imaging guidance (40981)  Indication(s) Pleural Effusion  Consent Risks of the procedure as well as the alternatives and risks of each were explained to the patient and/or caregiver.  Consent for the procedure was obtained and is signed in the bedside chart  Anesthesia Topical only with 1% lidocaine     Time Out Verified patient identification, verified procedure, site/side was marked, verified correct patient position, special equipment/implants available, medications/allergies/relevant history reviewed, required imaging and test results available.   Sterile Technique Maximal sterile technique including full sterile barrier drape, hand hygiene, sterile gown, sterile gloves, mask, hair covering, sterile ultrasound probe cover (if used).  Procedure Description Ultrasound was used to identify appropriate pleural anatomy for placement and overlying skin marked.  Area of drainage cleaned and draped in sterile fashion. Lidocaine  was used to anesthetize the skin and subcutaneous tissue.  1200 cc's of serous appearing fluid was drained from the left pleural space. Catheter then removed and bandaid applied to site.   Complications/Tolerance None; patient tolerated the procedure well. Chest X-ray is ordered to confirm no post-procedural complication.   EBL Minimal   Specimen(s) Pleural fluid sent for cytology and usual studies.   Louie Rover, MD Pulmonary and Critical Care Medicine [facility] 04/29/2024 1:30 PM Pager: see AMION  If no response to pager, please call critical care on call (see AMION) until 7pm After 7:00 pm call Elink

## 2024-04-29 NOTE — Plan of Care (Signed)
  Problem: Clinical Measurements: Goal: Ability to maintain clinical measurements within normal limits will improve Outcome: Progressing Goal: Diagnostic test results will improve Outcome: Progressing   Problem: Pain Managment: Goal: General experience of comfort will improve and/or be controlled Outcome: Progressing

## 2024-04-29 NOTE — Progress Notes (Signed)
 PT Cancellation Note  Patient Details Name: Anne Roach MRN: [id number] DOB: 24-Apr-1933   Cancelled Treatment:    Reason Eval/Treat Not Completed: Patient at procedure or test/unavailable (Pt off floor for thoracentesis. Will follow-up for PT treatment as schedule permits.)  Glenford Lanes, PT, DPT Acute Rehabilitation Services Office: [phone] Secure Chat Preferred  Riva Chester 04/29/2024, 12:48 PM

## 2024-04-30 DIAGNOSIS — J90 Pleural effusion, not elsewhere classified: Secondary | ICD-10-CM

## 2024-04-30 DIAGNOSIS — N183 Chronic kidney disease, stage 3 unspecified: Secondary | ICD-10-CM | POA: Diagnosis not present

## 2024-04-30 DIAGNOSIS — I4891 Unspecified atrial fibrillation: Secondary | ICD-10-CM | POA: Diagnosis not present

## 2024-04-30 LAB — CBC
HCT: 41 % (ref 36.0–46.0)
Hemoglobin: 12.9 g/dL (ref 12.0–15.0)
MCH: 30.6 pg (ref 26.0–34.0)
MCHC: 31.5 g/dL (ref 30.0–36.0)
MCV: 97.2 fL (ref 80.0–100.0)
Platelets: 159 10*3/uL (ref 150–400)
RBC: 4.22 MIL/uL (ref 3.87–5.11)
RDW: 14.6 % (ref 11.5–15.5)
WBC: 6.9 10*3/uL (ref 4.0–10.5)
nRBC: 0 % (ref 0.0–0.2)

## 2024-04-30 LAB — COMPREHENSIVE METABOLIC PANEL WITH GFR
ALT: 9 U/L (ref 0–44)
AST: 16 U/L (ref 15–41)
Albumin: 2.7 g/dL — ABNORMAL LOW (ref 3.5–5.0)
Alkaline Phosphatase: 44 U/L (ref 38–126)
Anion gap: 9 (ref 5–15)
BUN: 20 mg/dL (ref 8–23)
CO2: 26 mmol/L (ref 22–32)
Calcium: 8.6 mg/dL — ABNORMAL LOW (ref 8.9–10.3)
Chloride: 107 mmol/L (ref 98–111)
Creatinine, Ser: 1.29 mg/dL — ABNORMAL HIGH (ref 0.44–1.00)
GFR, Estimated: 40 mL/min — ABNORMAL LOW (ref >60)
Glucose, Bld: 110 mg/dL — ABNORMAL HIGH (ref 70–99)
Potassium: 4.1 mmol/L (ref 3.5–5.1)
Sodium: 142 mmol/L (ref 135–145)
Total Bilirubin: 0.5 mg/dL (ref 0.0–1.2)
Total Protein: 6 g/dL — ABNORMAL LOW (ref 6.5–8.1)

## 2024-04-30 MED ORDER — TRAMADOL HCL 50 MG PO TABS
50.0000 mg | ORAL_TABLET | Freq: Every day | ORAL | Status: DC | PRN
  Administered 2024-05-03: 50 mg via ORAL
  Filled 2024-04-30: qty 1

## 2024-04-30 NOTE — Discharge Instructions (Addendum)
 You were recently admitted to [facility] for recurrence of your pleural effusion.  We drained it twice during this admission and the fluid studies suggest that your heart failure is causing this effusion.  Please follow-up with the [facility] pulmonology office if you have any issues with the fluid returning.  Please follow-up with your primary care provider within the next ONE WEEK.  Your blood pressure was low here after having the fluid removed from manage along but got better after we gave you a little bit of IV fluid.  I would like you to take a decreased dose of your metoprolol  at home, please start to take 12.5 mg of metoprolol  (0.5 tablet of 25 mg).  Please be very careful getting up from a seated position so if you get dizzy you can sit back down.  I have also attached some information about orthostatic hypotension to this packet.  Please take lasix  20 mg AS NEEDED: if you have shortness of breath, leg swelling, gain more than 3 pounds within one day or 5 pounds in one week.  Do not take Jardiance , hold off until you see your family doctor.  You may wear ted-hoses if you continue to feel dizzy.   If you have any questions of concerns: please call us  at [phone]

## 2024-04-30 NOTE — Progress Notes (Signed)
 Occupational Therapy Treatment Patient Details Name: Anne Roach MRN: [id number] DOB: Apr 17, 1933 Today's Date: 04/30/2024   History of present illness 89 y.o. female referred to the ED by her cardiologist after echo resulted in reduced LV ejection fraction of 40-45% and hypoxia with O2 sat of 80%. Large Left pleural effusion; 6/12 thoracentesis PMH chronic pleural effusions, DM, A fib on Eliquis , OSA, CKD 3, HTN, colorectal Ca with partial colectomy, macular degeneration, chronic pain, CHF.   OT comments  Patient received in supine with complaints of right knee pain but willing to participate with OT. Patient's BP in supine was 92/59 (70) and required min assist to get to EOB. BP on EOB was 80/55 (64), SpO2 97% on 2 liters and stood with SpO2 94% and BP 69/37 (47) with no complaints of dizziness. Patient was transferred to recliner and placed in reclined position with feet elevated 99/61 (73).  After resting patient performed another stand from recliner with BP 82/49 (60) and had complaints of dizziness and placed back in reclined position.  Patient BP increased to 90/50 (62) and patient asked to stay in chair in reclined position. Acute OT to continue to follow to address established goals to facilitate DC to next venue of care.        If plan is discharge home, recommend the following:  A little help with bathing/dressing/bathroom;Assistance with cooking/housework   Equipment Recommendations  None recommended by OT    Recommendations for Other Services      Precautions / Restrictions Precautions Precautions: Fall Recall of Precautions/Restrictions: Intact Precaution/Restrictions Comments: reports fell 2 days ago due to dizziness/imbalance Restrictions Weight Bearing Restrictions Per Provider Order: No       Mobility Bed Mobility Overal bed mobility: Needs Assistance Bed Mobility: Supine to Sit     Supine to sit: Min assist     General bed mobility comments: increased time  and assistance to scoot to EOB    Transfers Overall transfer level: Needs assistance Equipment used: Rolling walker (2 wheels) Transfers: Bed to chair/wheelchair/BSC Sit to Stand: Contact guard assist     Step pivot transfers: Min assist     General transfer comment: min assist to steady     Balance Overall balance assessment: Needs assistance Sitting-balance support: Feet unsupported, No upper extremity supported Sitting balance-Leahy Scale: Fair Sitting balance - Comments: EOB   Standing balance support: Bilateral upper extremity supported, During functional activity Standing balance-Leahy Scale: Fair Standing balance comment: stood from EOB, for transfer, and from recliner with limited standing tolerance                           ADL either performed or assessed with clinical judgement   ADL Overall ADL's : Needs assistance/impaired     Grooming: Sitting;Contact guard assist Grooming Details (indicate cue type and reason): EOB         Upper Body Dressing : Sitting;Set up Upper Body Dressing Details (indicate cue type and reason): gown     Toilet Transfer: Minimal assistance;Rolling walker (2 wheels) Toilet Transfer Details (indicate cue type and reason): simulated         Functional mobility during ADLs: Minimal assistance;Rolling walker (2 wheels) General ADL Comments: limited self care tasks due to soft BP    Extremity/Trunk Assessment              Vision       Perception     Praxis     Communication Communication  Communication: No apparent difficulties Factors Affecting Communication: Non - English speaking, interpreter not available (Does speak english but limited responses)   Cognition Arousal: Alert Behavior During Therapy: Flat affect Cognition: Difficult to assess                               Following commands: Impaired Following commands impaired: Follows one step commands with increased time       Cueing   Cueing Techniques: Verbal cues, Gestural cues  Exercises      Shoulder Instructions       General Comments on 2 liters with SpO2 at 97% EOB and 94% while standing. Patient in supine 92/59 (70), EOB 80/55 (64), Standing 69/37 (47), in recliner 99/61 (73), standing from recliner 90/50 (62)    Pertinent Vitals/ Pain       Pain Assessment Pain Assessment: 0-10 Pain Score: 8  Pain Location: right knee Pain Descriptors / Indicators: Aching, Grimacing, Guarding Pain Intervention(s): Limited activity within patient's tolerance, Monitored during session, Repositioned  Home Living                                          Prior Functioning/Environment              Frequency  Min 2X/week        Progress Toward Goals  OT Goals(current goals can now be found in the care plan section)  Progress towards OT goals: Progressing toward goals  Acute Rehab OT Goals Patient Stated Goal: none stated OT Goal Formulation: Patient unable to participate in goal setting Time For Goal Achievement: 05/12/24 Potential to Achieve Goals: Good ADL Goals Pt Will Perform Grooming: with contact guard assist;standing Pt Will Perform Lower Body Dressing: with contact guard assist;sit to/from stand Pt Will Transfer to Toilet: with supervision;ambulating Pt Will Perform Toileting - Clothing Manipulation and hygiene: with supervision;sit to/from stand  Plan      Co-evaluation                 AM-PAC OT 6 Clicks Daily Activity     Outcome Measure   Help from another person eating meals?: None Help from another person taking care of personal grooming?: A Little Help from another person toileting, which includes using toliet, bedpan, or urinal?: A Lot Help from another person bathing (including washing, rinsing, drying)?: A Little Help from another person to put on and taking off regular upper body clothing?: A Little Help from another person to put on and taking  off regular lower body clothing?: A Little 6 Click Score: 18    End of Session Equipment Utilized During Treatment: Gait belt;Rolling walker (2 wheels)  OT Visit Diagnosis: Other (comment) (SOB)   Activity Tolerance Patient tolerated treatment well (soft BP,dizzy at end of session)   Patient Left in chair;with call bell/phone within reach;with chair alarm set   Nurse Communication Mobility status        Time: 7253-6644 OT Time Calculation (min): 29 min  Charges: OT General Charges $OT Visit: 1 Visit OT Treatments $Self Care/Home Management : 8-22 mins $Therapeutic Activity: 8-22 mins  Anitra Barn, OTA Acute Rehabilitation Services  Office [phone]   Jovita Nipper 04/30/2024, 1:20 PM

## 2024-04-30 NOTE — Plan of Care (Signed)

## 2024-04-30 NOTE — Plan of Care (Signed)
  Problem: Health Behavior/Discharge Planning: Goal: Ability to manage health-related needs will improve Outcome: Progressing   Problem: Clinical Measurements: Goal: Ability to maintain clinical measurements within normal limits will improve Outcome: Progressing Goal: Diagnostic test results will improve Outcome: Progressing Goal: Respiratory complications will improve Outcome: Progressing Goal: Cardiovascular complication will be avoided Outcome: Progressing   

## 2024-04-30 NOTE — Progress Notes (Signed)
 NAME:  Anne Roach, MRN:  [id number], DOB:  May 21, 1933, LOS: 0 ADMISSION DATE:  04/27/2024, CONSULTATION DATE: 04/27/2024 REFERRING MD: Sedation, CHIEF COMPLAINT: Shortness of breath with large left pleural effusion  History of Present Illness:  89 year old female who has obstructive sleep apnea noncompliant with CPAP and has also well-documented comorbidities as listed below.  She presents with increasing shortness of breath in March she had 1.4 L tapped for left-sided [facility].  She again presents with symptoms chest x-ray shows large left pleural effusion.  Pulmonary critical care asked to evaluate for possible thoracentesis.  Pertinent  Medical History   Past Medical History:  Diagnosis Date   A-fib (HCC)    Acute on chronic diastolic CHF (congestive heart failure) (HCC) 03/27/2022   Anemia    Arthritis    Atrial fibrillation (HCC)    Atrial fibrillation with RVR (HCC) 03/20/2017   Cancer (HCC)    colon cancer   CAP (community acquired pneumonia) 01/25/2024   Chronic diastolic CHF (congestive heart failure) (HCC) 10/31/2020   DM (diabetes mellitus) (HCC)    HTN (hypertension)    Occasional tremors    on Inderal  for this   Sepsis secondary to UTI (HCC) 07/11/2020   Sleep apnea    not used cpap in a long time per daughter   Tremor      Significant Hospital Events: Including procedures, antibiotic start and stop dates in addition to other pertinent events   04/28/2024 thoracentesis left 1500 cc obtained 6/13 remainder of left sided effusion drained 1400 cc  Interim History / Subjective:  No overnight issues. She didn't sleep well so is resting now. Daughter at bedside. Wants to know if they can go home.   Objective    Blood pressure 92/66, pulse (!) 130, temperature 99.5 F (37.5 C), temperature source Oral, resp. rate 17, height 5' 5 (1.651 m), weight 65 kg, SpO2 95%.       No intake or output data in the 24 hours ending 04/30/24 1601 Filed Weights    04/27/24 1239 04/29/24 0442 04/30/24 0400  Weight: 65.8 kg 65.4 kg 65 kg    Examination: Elderly, well nourished woman Resting comfortably on nasal cannula No respiratory distress RRR no mrg Breathing non labored, clear No peripheral edema  Resolved problem list   Assessment and Plan  Recurrent Left sided pleural effusion Esophageal Dysmotility  CKD Stage 3 Chronic HFrEF EF 40-45% Atrial fibrillation on Eliquis  OSA, Noncompliant with CPAP  Pleural fluid studies reviewed. Mildly exudative based on LDH and protein criteria. Suspect pseudoexudative in the setting of diuretic use. Suspect this is secondary to her CHF. Follow cytology.   CT Chest personally reviewed - the lingular and RLL changes are very umimpressive and in the absence of fevers, cough, sputum production and overt respiratory symptoms since thoracentesis, would favor not treating this as a pneumonia. Could be small microaspiration changes in the setting of her esophageal dysmotility.   I have given her our follow up information should she have recurrence of pleural effusion as we did discuss pleurx catheter placement for palliation.   No objection to discharge from a pulmonary perspective as long as someone can follow up on the cytology from her pleural fluid studies.   Louie Rover, MD Pulmonary and Critical Care Medicine [facility] 04/30/2024 4:08 PM Pager: see AMION  If no response to pager, please call critical care on call (see AMION) until 7pm After 7:00 pm call Elink     Best Practice (right  click and Reselect all SmartList Selections daily)   Per primary

## 2024-04-30 NOTE — Progress Notes (Addendum)
 Subjective:  Interval events: Patient received Zofran  4 mg x2 for nausea and vomiting.  Patient reports she is feeling well this morning. Her breathing has significantly improved after the second thoracentesis yesterday. She denies any cough, choking, dysphagia, nausea, or vomiting. She does have some epigastric abdominal pain, particularly with palpation. She does not think her daughter is coming to visit today.  Objective:  Vital signs in last 24 hours: Vitals:   04/29/24 2340 04/30/24 0000 04/30/24 0400 04/30/24 0800  BP:  (!) 115/90 112/72 (!) 113/101  Pulse: 74 89 88 (!) 130  Resp: 19 19 18 20   Temp:   99.5 F (37.5 C)   TempSrc:   Oral   SpO2: 97% 97% 91% 95%  Weight:   65 kg   Height:       Weight change: -0.409 kg  General: Older-appearing female sleeping on left side in hospital bed, appears comfortable and in NAD. HEENT: Normocephalic and atraumatic. Conjunctivae clear. Nasal cannula in place. Cardiovascular: Normal rate with irregular rhythm. No murmurs, rubs, or gallops. Pulmonary: Difficult to assess due to poor respiratory effort. Right lung clear to auscultation. Possible crackles in left middle lobe but otherwise clear. Abdominal: Soft, non-distended. Tenderness to palpation in epigastric area. Bowel sounds present. Neurological: Alert, responds to questions appropriately. Skin: Warm and dry.  Assessment/Plan:  Principal Problem:   Pleural effusion, left Active Problems:   Shortness of breath   Type 2 diabetes mellitus (HCC)   Chronic kidney disease (CKD), stage III (moderate)   Chronic atrial fibrillation (HCC)   Chronic HFrEF (heart failure with reduced ejection fraction) (HCC)   Gout   Esophageal dysmotility   Falls   Hypoxic respiratory failure (HCC)  Anne Roach is a 89 year-old female with PMH of HFimpEF (EF 40-45%), permanent Afib on Eliquis , CKD stage III, T2DM, esophageal dysmotility, and colorectal carcinoma s/p partial colectomy in  09/2023 who presented for hypoxia and admitted on 04/28/2024 for large left pleural effusion.   #Left Pleural Effusion Patient remains on supplemental oxygen after undergoing 1.5L thoracentesis with PCCM on 6/12 and 1.2L thoracentesis on 6/13. Pleural fluid studies with LDH 75 (serum 123), total protein 4.4 (serum 6.0), TNC 1214, glucose 128, amylase 43, triglycerides 55, gram stain with few WBC, and cytology with chronic inflammation. These results technically meet criteria for exudative effusion based on increased protein (ratio 0.67). CT Chest with new heterogeneous opacities mainly in the lingular segment of left upper lobe and less pronounced opacities in the left lung lower lobe compatible with multilobar pneumonia, trace left pleural effusion, and markedly dilated in ingested food material/air-filled upper third thoracic esophagus. Differential includes pneumonitis from chronic aspiration vs small esophageal leak since markedly distended. Will discuss with PCCM regarding most likely etiology and next steps in management.  - PCCM consulted; appreciate recs - Consider antibiotic treatment for possible PNA - Consider esophagram to assess for esophageal leak - Telemetry   #Hypoxic respiratory failure Hypoxia likely secondary to large pleural effusion since her oxygen requirement has decreased to 2L [place] after the two thoracenteses. Do not think patient is experiencing heart failure exacerbation even though BNP elevated at 219.8 on admission since she has no crackles, JVD, or LE edema on exam. Do not think she has PE (dyspnea was not acute, patient has been hemodynamically stable). Also do not think she has pneumonia (no fevers, chills, cough). However, since CT concerning for multilobar pneumonia, will discuss with PCCM about antibiotic treatment vs observation. - Consider antibiotic treatment for  possible PNA - Ambulatory pulse ox prior to discharge - Supplemental oxygen for SpO2  <92%  #Falls #Orthostatic hypotension Patient reports recent fall on 6/7 from dizziness. Etiology initially thought to be secondary to orthostatic hypotension vs permanent Afib. PT and OT evaluated patient and recommended home health PT and OT. Orthostatic vital signs were checked on 6/14 and positive with lying BP 92/59, sitting BP 80/55, and standing BP 69/37. Will hold home furosemide  and repeat orthostatics tomorrow. - Hold home furosemide  20 mg daily - Repeat orthostatic vital signs tomorrow - HH PT/OT at discharge  #Biventricular heart failure Patient is euvolemic on exam. EF 40-45% on most recent echocardiogram, which is actually improved from 30-35% in 12/2022. We will continue her home Jardiance  and metoprolol  while hospitalized. Will hold her home furosemide  for now given orthostatic hypotension. - Hold home furosemide  20 mg daily - Home metoprolol  succinate 50 mg daily - Home Jardiance  10mg  daily   #Esophageal dysmotility Patient has a history of chronic dysphagia in the setting of severe esophageal dysmotility. SLP consulted this admission and has signed off after no signs of oropharyngeal dysphagia on evaluation. They recommended advancing diet to mechanical soft (dysphagia 3), which we will continue while hospitalized. - Mechanical soft (dysphagia 3) diet - Aspiration precautions   #Chronic atrial fibrillation Patient remains in atrial fibrillation but is stable with normal to mildly tachycardic heart rate. Will continue home metoprolol  and resume home Eliquis  now that repeat thoracentesis was completed. - Home metoprolol  succinate 50 mg daily - Home Eliquis  5 mg BID   #CKD stage IIIa Creatinine fluctuating this admission (1.25 -> 1.08 -> 1.29), currently above baseline of 0.9-1.0. Will continue to monitor. - Repeat BMP tomorrow AM   #T2DM Blood glucose is stable. Most recent Hgb A1c 5.3%. Will continue home Jardiance  while hospitalized. - Home Jardiance  10 mg daily    LOS: 0 days   Anne Roach, Medical Student 04/30/2024, 8:50 AM   Attestation for Student Documentation:  I personally was present and re-performed the history, physical exam and medical decision-making activities of this service and have verified that the service and findings are accurately documented in the student's note.  Cleven Dallas, DO 04/30/2024, 2:16 PM

## 2024-04-30 NOTE — Progress Notes (Signed)
   04/29/24 1925  Assess: MEWS Score  Temp 98 F (36.7 C)  BP (!) 80/60  MAP (mmHg) 67  Pulse Rate 88  ECG Heart Rate 90  Resp 20  SpO2 96 %  O2 Device Nasal Cannula  Assess: MEWS Score  MEWS Temp 0  MEWS Systolic 2  MEWS Pulse 0  MEWS RR 0  MEWS LOC 0  MEWS Score 2  MEWS Score Color Yellow  Assess: if the MEWS score is Yellow or Red  Were vital signs accurate and taken at a resting state? Yes  Does the patient meet 2 or more of the SIRS criteria? No  MEWS guidelines implemented  Yes, yellow  Treat  MEWS Interventions Considered administering scheduled or prn medications/treatments as ordered  Take Vital Signs  Increase Vital Sign Frequency  Yellow: Q2hr x1, continue Q4hrs until patient remains green for 12hrs  Escalate  MEWS: Escalate Yellow: Discuss with charge nurse and consider notifying provider and/or RRT  Notify: Charge Nurse/RN  Name of Charge Nurse/RN Notified Matthew RN  Assess: SIRS CRITERIA  SIRS Temperature  0  SIRS Respirations  0  SIRS Pulse 0  SIRS WBC 0  SIRS Score Sum  0     04/29/24 1940  Provider Notification  Provider Name/Title Dr. Abner Hoffman  Date Provider Notified 04/29/24  Time Provider Notified 1940  Method of Notification Page  Notification Reason Other (Comment) (Low BP and Yellow MEWS)  Provider response No new orders  Date of Provider Response 04/29/24  Time of Provider Response 1940    04/29/24 2200  Vitals  BP 102/70  MAP (mmHg) 80  Pulse Rate 84  ECG Heart Rate 99  Resp 20  MEWS COLOR  MEWS Score Color Green  Oxygen Therapy  SpO2 95 %  MEWS Score  MEWS Temp 0  MEWS Systolic 0  MEWS Pulse 0  MEWS RR 0  MEWS LOC 0  MEWS Score 0

## 2024-05-01 LAB — BASIC METABOLIC PANEL WITH GFR
Anion gap: 6 (ref 5–15)
BUN: 22 mg/dL (ref 8–23)
CO2: 29 mmol/L (ref 22–32)
Calcium: 8.2 mg/dL — ABNORMAL LOW (ref 8.9–10.3)
Chloride: 103 mmol/L (ref 98–111)
Creatinine, Ser: 1.34 mg/dL — ABNORMAL HIGH (ref 0.44–1.00)
GFR, Estimated: 38 mL/min — ABNORMAL LOW (ref >60)
Glucose, Bld: 109 mg/dL — ABNORMAL HIGH (ref 70–99)
Potassium: 4.2 mmol/L (ref 3.5–5.1)
Sodium: 138 mmol/L (ref 135–145)

## 2024-05-01 LAB — BODY FLUID CULTURE W GRAM STAIN: Culture: NO GROWTH

## 2024-05-01 MED ORDER — LACTATED RINGERS IV BOLUS
500.0000 mL | Freq: Once | INTRAVENOUS | Status: AC
  Administered 2024-05-01: 500 mL via INTRAVENOUS

## 2024-05-01 MED ORDER — METOPROLOL SUCCINATE ER 25 MG PO TB24
25.0000 mg | ORAL_TABLET | Freq: Every day | ORAL | Status: DC
  Administered 2024-05-02: 25 mg via ORAL
  Filled 2024-05-01: qty 1

## 2024-05-01 MED ORDER — ENSURE PLUS HIGH PROTEIN PO LIQD
237.0000 mL | Freq: Two times a day (BID) | ORAL | Status: DC
  Administered 2024-05-01 – 2024-05-02 (×2): 237 mL via ORAL

## 2024-05-01 MED ORDER — LACTATED RINGERS IV BOLUS
250.0000 mL | Freq: Once | INTRAVENOUS | Status: AC
  Administered 2024-05-01: 250 mL via INTRAVENOUS

## 2024-05-01 NOTE — Progress Notes (Signed)
 Subjective: Anne Roach is a 89 year-old female with PMH of HFimpEF (EF 40-45%), permanent Afib on Eliquis , CKD stage III, T2DM, esophageal dysmotility, and colorectal carcinoma s/p partial colectomy in 09/2023 who presented for hypoxia and admitted on 04/28/2024 for large left pleural effusion.  Since admission she has had 2 thoracentesis with 1-1.5 L taken off each time and fluid studies consistent with pseudo exudative effusion due to heart failure.  She has been stable on room air or 2 L the nasal cannula which she has intermittently at home.  She has had some trouble with orthostatic hypotension.  Today she is doing well and is off oxygen.  She denies any new or worsening complaints but when repeating orthostatics she did get dizzy again and her blood pressure was low.  Discussed this with the daughter in the room and she says the patient is eating just as much as she is at home but does appear to be more sleepy today.  Objective:  Vital signs in last 24 hours: Vitals:   05/01/24 0940 05/01/24 0944 05/01/24 0959 05/01/24 1135  BP: (!) 85/47 (!) 79/47 (!) 73/48 (!) 78/47  Pulse:    90  Resp: (!) 24 (!) 23  18  Temp:    98.2 F (36.8 C)  TempSrc:    Oral  SpO2:    96%  Weight:      Height:       Weight change:   General: Older-appearing female sleeping on left side in hospital bed, appears comfortable and in NAD. Cardiovascular: Normal rate with irregular rhythm. Pulmonary: Normal work of breathing on room air Neurological: Alert, responds to questions appropriately. Skin: Warm and dry.  Assessment/Plan:  Principal Problem:   Pleural effusion, left Active Problems:   Shortness of breath   Type 2 diabetes mellitus (HCC)   Chronic kidney disease (CKD), stage III (moderate)   Chronic atrial fibrillation (HCC)   Chronic HFrEF (heart failure with reduced ejection fraction) (HCC)   Gout   Esophageal dysmotility   Falls   Hypoxic respiratory failure (HCC)  Anne Roach  is a 89 year-old female with PMH of HFimpEF (EF 40-45%), permanent Afib on Eliquis , CKD stage III, T2DM, esophageal dysmotility, and colorectal carcinoma s/p partial colectomy in 09/2023 who presented for hypoxia and admitted on 04/28/2024 for large left pleural effusion.   #Left Pleural Effusion, likely pseudoexudative 2/2 CHF Today she is on room air and stable without any recurrence of her dyspnea.  Discussed further with pulmonology yesterday who do not think antibiotics are warranted although imaging suggests lung infection.  She is not coughing, afebrile, and no return of her dyspnea since thoracentesis. - Pulmonary follow-up if needed outpatient   #Hypoxic respiratory failure 2/2 pleural effusion - Ambulatory pulse ox prior to discharge - Supplemental oxygen for SpO2 <92%  #Falls #Orthostatic hypotension Patient has had trouble with orthostatic hypotension at home and prior to hopeful discharge yesterday she had significantly low blood pressures with systolics in the 70s and was symptomatic during orthostatic vitals.  Today her orthostatic vital signs are again positive with symptoms.  She also appears more tired and may be dehydrated due to the large amount of fluid removed with the 2 thoracentesis.   - Hold home furosemide  20 mg daily and metoprolol  50 mg daily (last dose 5 PM 6/15) - Repeat orthostatic vital signs after 500 mL LR bolus - HH PT/OT at discharge  #Biventricular heart failure Patient is euvolemic or slightly dry today.  - Hold  home furosemide  20 mg daily - Hold Home metoprolol  succinate 50 mg daily, plan to decrease to 25 mg tomorrow morning - Home Jardiance  10mg  daily   #Esophageal dysmotility Patient has a history of chronic dysphagia in the setting of severe esophageal dysmotility. SLP consulted this admission and has signed off after no signs of oropharyngeal dysphagia on evaluation. They recommended advancing diet to mechanical soft (dysphagia 3), which we will  continue while hospitalized. - Mechanical soft (dysphagia 3) diet - Aspiration precautions   #Chronic atrial fibrillation Patient remains in atrial fibrillation but is stable with normal to mildly tachycardic heart rate. Will continue home metoprolol  and resume home Eliquis  now that repeat thoracentesis was completed. - Holding Home metoprolol  succinate 50 mg daily, plan to decrease to 25 mg tomorrow - Home Eliquis  5 mg BID   #CKD stage IIIa Baseline around 1.0.  1.34 today, possibly due to dehydration after fluid removal.  Getting 500 mL LR bolus today.   #T2DM Blood glucose is stable. Most recent Hgb A1c 5.3%. Will continue home Jardiance  while hospitalized. - Home Jardiance  10 mg daily  Cleven Dallas, DO Internal Medicine Resident, PGY-2 Please contact the on call pager at [phone] for any urgent or emergent needs. 12:20 PM 05/01/2024

## 2024-05-01 NOTE — Progress Notes (Signed)
 Physical Therapy Treatment Patient Details Name: Anne Roach MRN: [id number] DOB: 06/16/1933 Today's Date: 05/01/2024   History of Present Illness 89 y.o. female referred to the ED by her cardiologist after echo resulted in reduced LV ejection fraction of 40-45% and hypoxia with O2 sat of 80%. Large Left pleural effusion; 6/12 thoracentesis PMH chronic pleural effusions, DM, A fib on Eliquis , OSA, CKD 3, HTN, colorectal Ca with partial colectomy, macular degeneration, chronic pain, CHF.    PT Comments  Pt remains orthostatic. See flowsheet for all values. After amb 12' to recliner pt stood and BP was 73/48. Will need this to improve before she is able to mobilize safely at home.     If plan is discharge home, recommend the following: A little help with walking and/or transfers;A little help with bathing/dressing/bathroom;Assistance with cooking/housework;Assist for transportation;Help with stairs or ramp for entrance   Can travel by private vehicle        Equipment Recommendations  None recommended by PT    Recommendations for Other Services       Precautions / Restrictions Precautions Precautions: Fall Recall of Precautions/Restrictions: Intact Precaution/Restrictions Comments: reports fell 2 days ago due to dizziness/imbalance     Mobility  Bed Mobility Overal bed mobility: Needs Assistance Bed Mobility: Supine to Sit     Supine to sit: Min assist     General bed mobility comments: Assist to elevate trunk into sitting    Transfers Overall transfer level: Needs assistance Equipment used: Rollator (4 wheels) Transfers: Sit to/from Stand Sit to Stand: Contact guard assist           General transfer comment: Assist for safety    Ambulation/Gait Ambulation/Gait assistance: Contact guard assist Gait Distance (Feet): 12 Feet Assistive device: Rollator (4 wheels) Gait Pattern/deviations: Step-through pattern, Decreased step length - right, Decreased step length  - left, Decreased stride length Gait velocity: decr Gait velocity interpretation: <1.31 ft/sec, indicative of household ambulator   General Gait Details: Assist for Media planner     Tilt Bed    Modified Rankin (Stroke Patients Only)       Balance Overall balance assessment: Needs assistance, History of Falls Sitting-balance support: No upper extremity supported, Feet supported Sitting balance-Leahy Scale: Good     Standing balance support: No upper extremity supported Standing balance-Leahy Scale: Fair                              Communication Communication Communication: Impaired Factors Affecting Communication: Hearing impaired;Other (comment) (English is not her first language)  Cognition Arousal: Alert Behavior During Therapy: Flat affect   PT - Cognitive impairments: Problem solving                         Following commands: Impaired Following commands impaired: Follows one step commands with increased time    Cueing Cueing Techniques: Verbal cues, Tactile cues  Exercises      General Comments General comments (skin integrity, edema, etc.): See flow sheet for orthostatic BP's      Pertinent Vitals/Pain Pain Assessment Pain Assessment: No/denies pain    Home Living                          Prior Function  PT Goals (current goals can now be found in the care plan section) Acute Rehab PT Goals Patient Stated Goal: unable to state Progress towards PT goals: Progressing toward goals    Frequency    Min 2X/week      PT Plan      Co-evaluation PT/OT/SLP Co-Evaluation/Treatment: Yes            AM-PAC PT 6 Clicks Mobility   Outcome Measure  Help needed turning from your back to your side while in a flat bed without using bedrails?: A Little Help needed moving from lying on your back to sitting on the side of a flat bed without using  bedrails?: A Little Help needed moving to and from a bed to a chair (including a wheelchair)?: A Little Help needed standing up from a chair using your arms (e.g., wheelchair or bedside chair)?: A Little Help needed to walk in hospital room?: Total (<20 ft) Help needed climbing 3-5 steps with a railing? : Total 6 Click Score: 14    End of Session Equipment Utilized During Treatment: Gait belt Activity Tolerance: Other (comment) (orthostatic) Patient left: with call bell/phone within reach;in chair;with chair alarm set;with family/visitor present Nurse Communication: Mobility status;Other (comment) (Nurse present throughout and assisting with orthostatics) PT Visit Diagnosis: Unsteadiness on feet (R26.81);Repeated falls (R29.6);Muscle weakness (generalized) (M62.81);Difficulty in walking, not elsewhere classified (R26.2)     Time: 1610-9604 PT Time Calculation (min) (ACUTE ONLY): 28 min  Charges:    $Gait Training: 23-37 mins PT General Charges $$ ACUTE PT VISIT: 1 Visit                     [facility] PT Acute Rehabilitation Services Office [phone]    Pura Browns [facility] 05/01/2024, 10:50 AM

## 2024-05-01 NOTE — Plan of Care (Signed)
  Problem: Education: Goal: Knowledge of General Education information will improve Description: Including pain rating scale, medication(s)/side effects and non-pharmacologic comfort measures Outcome: Progressing   Problem: Clinical Measurements: Goal: Ability to maintain clinical measurements within normal limits will improve Outcome: Progressing Goal: Will remain free from infection Outcome: Progressing Goal: Diagnostic test results will improve Outcome: Progressing Goal: Cardiovascular complication will be avoided Outcome: Progressing   

## 2024-05-02 ENCOUNTER — Encounter (HOSPITAL_COMMUNITY): Payer: Self-pay | Admitting: Internal Medicine

## 2024-05-02 DIAGNOSIS — I951 Orthostatic hypotension: Secondary | ICD-10-CM | POA: Diagnosis not present

## 2024-05-02 DIAGNOSIS — N1832 Chronic kidney disease, stage 3b: Secondary | ICD-10-CM | POA: Diagnosis not present

## 2024-05-02 DIAGNOSIS — J90 Pleural effusion, not elsewhere classified: Secondary | ICD-10-CM | POA: Diagnosis not present

## 2024-05-02 LAB — BASIC METABOLIC PANEL WITH GFR
Anion gap: 10 (ref 5–15)
BUN: 23 mg/dL (ref 8–23)
CO2: 27 mmol/L (ref 22–32)
Calcium: 8.4 mg/dL — ABNORMAL LOW (ref 8.9–10.3)
Chloride: 101 mmol/L (ref 98–111)
Creatinine, Ser: 1.2 mg/dL — ABNORMAL HIGH (ref 0.44–1.00)
GFR, Estimated: 43 mL/min — ABNORMAL LOW (ref >60)
Glucose, Bld: 103 mg/dL — ABNORMAL HIGH (ref 70–99)
Potassium: 4.3 mmol/L (ref 3.5–5.1)
Sodium: 138 mmol/L (ref 135–145)

## 2024-05-02 MED ORDER — LACTATED RINGERS IV BOLUS: 500.0000 mL | Freq: Once | INTRAVENOUS | Status: DC

## 2024-05-02 MED ORDER — LACTATED RINGERS IV BOLUS
500.0000 mL | Freq: Once | INTRAVENOUS | Status: AC
  Administered 2024-05-02: 500 mL via INTRAVENOUS

## 2024-05-02 NOTE — Plan of Care (Signed)
   Problem: Health Behavior/Discharge Planning: Goal: Ability to manage health-related needs will improve Outcome: Progressing   Problem: Clinical Measurements: Goal: Ability to maintain clinical measurements within normal limits will improve Outcome: Progressing Goal: Diagnostic test results will improve Outcome: Progressing

## 2024-05-02 NOTE — Progress Notes (Addendum)
 Subjective:  Interval events: Orthostatic vital signs remained positive even after 750 mL bolus, so patient was not safe for discharge.  Patient reports her breathing is doing well this morning, but she is feeling dizzy even while sitting in bed. This dizziness also happens at home, so she just usually sits until the dizziness passes. She has not been able to eat much lately because food is getting stuck in her mid-esophagus, causing her to vomit.  Objective:  Vital signs in last 24 hours: Vitals:   05/01/24 2000 05/02/24 0043 05/02/24 0453 05/02/24 0735  BP: 117/69 131/81 (!) 101/55 (!) 151/72  Pulse:  100 88 71  Resp: 19 18 19 19   Temp: 99.5 F (37.5 C) 98.7 F (37.1 C) 98.9 F (37.2 C) 98.6 F (37 C)  TempSrc: Oral Axillary Oral Oral  SpO2: 97%  91% 91%  Weight:   67.2 kg   Height:        Intake/Output Summary (Last 24 hours) at 05/02/2024 1009 Last data filed at 05/02/2024 1610 Gross per 24 hour  Intake 1220.63 ml  Output --  Net 1220.63 ml   General: Older-appearing female sitting upright in hospital bed, appears comfortable and in NAD. Cardiovascular: Normal rate with irregular rhythm. No murmurs, rubs, or gallops. No LE edema. Pulmonary: Crackles in lung bases but otherwise clear to auscultation bilaterally. Normal WOB on RA. Abdominal: Soft, non-distended. Tenderness to palpation in epigastric area. Bowel sounds present. Neurological: Alert, responds to questions appropriately. Skin: Warm and dry.  Assessment/Plan:  Principal Problem:   Pleural effusion, left Active Problems:   Shortness of breath   Type 2 diabetes mellitus (HCC)   Chronic kidney disease (CKD), stage III (moderate)   Chronic atrial fibrillation (HCC)   Chronic HFrEF (heart failure with reduced ejection fraction) (HCC)   Gout   Esophageal dysmotility   Falls   Hypoxic respiratory failure (HCC)  Avnoor Santarelli is a 89 year-old female with PMH of HFimpEF (EF 40-45%), permanent Afib on  Eliquis , CKD stage III, T2DM, esophageal dysmotility, and colorectal carcinoma s/p partial colectomy in 09/2023 who presented for hypoxia and admitted on 04/28/2024 for large left pleural effusion. Hospital course now complicated by orthostatic hypotension.   #Left Pleural Effusion, likely pseudoexudative 2/2 CHF #Hypoxic respiratory failure Patient is stable on room air this morning without any shortness of breath, cough, or fevers. Although imaging was concerning for multilobar pneumonia, will not start any antibiotics since she remains asymptomatic. Ambulatory pulse oximetry was 92% when completed today, so she does not qualify for home oxygen. - Telemetry - Supplemental oxygen for SpO2 <92% - Pulmonology follow-up if needed at discharge   #Falls #Orthostatic hypotension Patient reports symptoms of orthostatic hypotension at home, but she has had significantly low blood pressures with SBP 60-70s during orthostatic vital signs here. Patient continues to report dizziness while sitting in bed this morning, so will give additional 500 mL bolus. Repeat orthostatic vital signs Will continue holding home furosemide , metoprolol , and Jardiance . Repeat orthostatics today with lying BP 105/62 and standing BP 86/71. Addressing esophageal dysmotility and vomiting as below. - 500 mL bolus x1 - Repeat orthostatic vital signs tomorrow - Hold home furosemide  20 mg daily - Hold home metoprolol  succinate 50 mg daily - Hold home Jardiance  10mg  daily - HH PT/OT at discharge  #Esophageal dysmotility Patient has a history of chronic dysphagia in the setting of severe esophageal dysmotility. She now feels like food is accumulating in her mid-esophagus, causing her to vomit when eating.  SLP has already been consulted this admission and signed off after no signs of oropharyngeal dysphagia on evaluation. They recommended advancing diet to mechanical soft (dysphagia 3), which has been completed. We may need to consider a  GI consult if symptoms fail to improve due to concerns for impaction (CT Chest with markedly dilated upper third esophagus with ingested food material/air-filled), especially since poor PO intake likely contributing to orthostatic hypotension. - Mechanical soft (dysphagia 3) diet - Aspiration precautions - Consider GI consult   #Biventricular heart failure Patient is relatively euvolemic on exam. Will hold her home medications given orthostatic hypotension. - Hold home furosemide  20 mg daily - Hold home metoprolol  succinate 50 mg daily - Hold home Jardiance  10mg  daily   #Persistent atrial fibrillation Patient remains in atrial fibrillation but is stable with normal heart rate. Will hold home metoprolol  in the setting of orthostatic hypotension but continue home Eliquis . Could consider restarting metoprolol  25 mg daily if heart rate increases. - Hold home metoprolol  succinate 50 mg daily - Home Eliquis  5 mg BID   #CKD stage IIIa Creatinine fluctuating this admission, currently above baseline of 0.9-1.0. Patient may be slightly dehydrated due to poor PO intake, so will continue to monitor after rehydration and holding diuretics. - Repeat BMP prior to discharge - 500 mL bolus x1   #T2DM Blood glucose is stable. Most recent Hgb A1c 5.3%. Will hold home Jardiance  for now given orthostatic hypotension. - Hold home Jardiance  10 mg daily   LOS: 0 days   Elmira Haddock, Medical Student 05/02/2024, 10:09 AM

## 2024-05-02 NOTE — Discharge Summary (Signed)
 Name: Anne Roach MRN: [id number] DOB: 05-24-1933 89 y.o. PCP: Ilah Crigler, MD  Date of Admission: 04/27/2024 12:22 PM Date of Discharge:  05/03/2024 Attending Physician: Dr. Eben  DISCHARGE DIAGNOSIS:  Primary Problem: Pleural effusion, left   Hospital Problems: Principal Problem:   Pleural effusion, left Active Problems:   Shortness of breath   Type 2 diabetes mellitus (HCC)   CKD stage 3b, GFR 30-44 ml/min (HCC)   Chronic atrial fibrillation (HCC)   Chronic HFrEF (heart failure with reduced ejection fraction) (HCC)   Gout   Esophageal dysmotility   Falls   Hypoxic respiratory failure (HCC)   Acute heart failure with reduced ejection fraction (HFrEF, <= 40%) (HCC)    DISCHARGE MEDICATIONS:   Allergies as of 05/03/2024   No Known Allergies      Medication List     PAUSE taking these medications    Jardiance  10 MG Tabs tablet Wait to take this until your doctor or other care provider tells you to start again. Generic drug: empagliflozin  TAKE 1 TABLET(10 MG) BY MOUTH DAILY       STOP taking these medications    amLODipine  2.5 MG tablet Commonly known as: NORVASC        TAKE these medications    Accu-Chek Aviva Plus test strip Generic drug: glucose blood   acetaminophen  500 MG tablet Commonly known as: TYLENOL  Take 1,000 mg by mouth every 6 (six) hours as needed for mild pain or headache.   allopurinol  100 MG tablet Commonly known as: ZYLOPRIM  TAKE 1 TABLET(100 MG) BY MOUTH DAILY What changed: See the new instructions.   diclofenac  Sodium 1 % Gel Commonly known as: VOLTAREN  Apply 4 g topically 4 (four) times daily. What changed:  when to take this reasons to take this   Eliquis  5 MG Tabs tablet Generic drug: apixaban  TAKE 1 TABLET(5 MG) BY MOUTH TWICE DAILY   feeding supplement Liqd Take 237 mLs by mouth 2 (two) times daily between meals.   folic acid  800 MCG tablet Commonly known as: FOLVITE  Take 800 mcg by mouth daily.    furosemide  20 MG tablet Commonly known as: LASIX  Take 1 tablet (20 mg total) by mouth daily as needed for fluid or edema. Please take one tablet if you have leg swelling, shortness of breath, gain more than 3 pounds in 24 hours or 5 pounds in one week What changed:  when to take this reasons to take this additional instructions   Fusion Plus Caps Take 1 capsule by mouth every other day.   gabapentin  400 MG capsule Commonly known as: NEURONTIN  Take 400 mg by mouth 3 (three) times daily.   ICAPS AREDS 2 PO Take 1 capsule by mouth 2 (two) times daily.   ipratropium 0.06 % nasal spray Commonly known as: ATROVENT  Place 2 sprays into both nostrils 2 (two) times daily as needed (nasal drainage).   loratadine  10 MG tablet Commonly known as: CLARITIN  Take 10 mg by mouth daily as needed for allergies.   metoprolol  succinate 25 MG 24 hr tablet Commonly known as: TOPROL -XL Take 0.5 tablets (12.5 mg total) by mouth daily. Take with or immediately following a meal. What changed:  medication strength how much to take   ondansetron  4 MG disintegrating tablet Commonly known as: ZOFRAN -ODT Take 4 mg by mouth every 8 (eight) hours as needed for nausea or vomiting.   pantoprazole  40 MG tablet Commonly known as: PROTONIX  Take 40 mg by mouth every morning.   Potassium Chloride  ER  20 MEQ Tbcr Take 20 mEq by mouth daily.   Vitamin D  125 MCG (5000 UT) Caps Take 5,000 Units by mouth every evening.         DISPOSITION AND FOLLOW-UP:  Anne Roach was discharged from [facility] in stable condition. At the hospital follow up visit please address:  Follow-up Recommendations: []  Repeat BMP to assess creatinine/GFR since elevated from baseline []  Outpatient GI follow-up at [facility] for esophageal dysmotility and recurrent aspiration []  Titrate metoprolol  as needed for rate control while minimizing hypotension []  Adjust furosemide  and Jardiance  as  needed  Follow-up Appointments:  Follow-up Information     Care, [facility] Follow up.   Specialty: Home Health Services Why: Agency will call you to set up apt times Contact information: [address] [place] [address] [phone]         [facility] at [facility]. Schedule an appointment as soon as possible for a visit.   Specialty: Pulmonology Why: As needed if recurrence of fluid around lungs Contact information: [address] [place]  [address] [phone] Additional information: [address]  [address]  [place], [place] [address]                HOSPITAL COURSE:  Patient Summary: Anne Roach is a 89 year-old female with PMH of HFimpEF (EF 40-45%), permanent Afib on Eliquis , CKD stage III, T2DM, esophageal dysmotility, and colorectal carcinoma s/p partial colectomy in 09/2023 who presented for hypoxia and admitted on 04/28/2024 for large left pleural effusion.   #Left Pleural Effusion Patient has had small bilateral pleural effusions for the past several years per imaging on chart review. Left pleural effusion started increasing in size around 11/2023. She eventually underwent thoracentesis during hospitalization at [facility] in 12/2023, at which time 1.4L of fluid was drained. Pleural fluid studies with cholesterol 58, LDH 72 (serum LDH 117), total protein 4 (serum total protein 6.5), glucose 104, total nucleated cells 2322, and triglycerides 58, which was consistent with an exudative effusion. Etiology thought to be secondary to pneumonitis from chronic aspiration. Patient presented with 2 months of progressively worsening shortness of breath and hypoxia to SpO2 80s when seen by her outpatient cardiologist, prompting referral to ED. Initial CXR with stable large left pleural effusion. She underwent 1.5L thoracentesis with PCCM on 6/12 and 1.2L thoracentesis on 6/13. Pleural fluid  studies with LDH 75 (serum 123), total protein 4.4 (serum 6.0), TNC 1214, glucose 128, amylase 43, triglycerides 55, gram stain with few WBC, and cytology with chronic inflammation. These results technically met criteria for exudative effusion based on increased protein (ratio 0.67), but discussed results with PCCM who thought recurrent pulmonary effusion was pseudoexudative secondary to diuretic use for CHF. Patient can follow-up with pulmonology outpatient is pleural effusion recurs for pleurx catheter placement.  #Hypoxic respiratory failure  Patient was hypoxic likely secondary to large pleural effusion since her oxygen requirement decreased after the two thoracenteses. Other differentials included heart failure exacerbation, but patient was euvolemic on exam without crackles, JVD, or LE edema. Pulmonary embolism was considered, but presentation not consistent since dyspnea was not acute and patient remained hemodynamically stable. Also considered pneumonia given CT Chest concerning for multilobar pneumonia (new heterogeneous opacities mainly in the lingular segment of left upper lobe and less pronounced opacities in the left lung lower lobe) but did not start antibiotic treatment since patient was asymptomatic and findings mostly likely secondary  to chronic aspiration. Patient did not need home oxygen at discharge per ambulatory pulse ox of 92%.   #Falls #Orthostatic hypotension Patient reported symptoms of orthostatic hypotension at home leading to falls. Blood pressure was significantly low with SBP 60-70s during initial orthostatic vital signs in the hospital. Patient received multiple 250-500 mL boluses over the course of 2-3 days with improvement in orthostatic vitals. Etiology most likely dehydration after removing 2.7L during two thoracentesis and poor PO intake due to recurrent vomiting from esophageal dysmotility. Her home medications were adjusted (metoprolol  50 mg -> 12.5 mg, furosemide  20 mg  daily -> PRN, and discontinued Jardiance ) to reduce hypotension. PT and OT also evaluated patient and recommended home health PT and OT.   #Esophageal dysmotility Patient has a history of chronic dysphagia in the setting of severe esophageal dysmotility. She intermittently experienced vomiting and globus sensation while hospitalized. CT Chest showed markedly dilated upper third esophagus with ingested food material/air-filled. SLP also consulted this admission and signed off after no signs of oropharyngeal dysphagia on evaluation. They recommended mechanical soft diet (dysphagia 3), which was continued throughout admission. Patient was continued on home pantoprazole  40 mg daily while hospitalized. She should follow-up with outpatient GI at [facility].   #Biventricular heart failure Patient remained euvolemic on exam throughout hospitalization. EF 40-45% on most recent echocardiogram, which actually improved from EF 30-35% in 12/2022. Her metoprolol  was decreased to 12.5 mg daily, home furosemide  changed to 20 mg PRN, and home Jardiance  held at discharge due to orthostatic hypotension.   #Permanent atrial fibrillation Patient was previously diagnosed with permanent A-fib. She remained in atrial fibrillation throughout hospitalization with rates 70-100s. Her home Eliquis  5 mg was intermittently held for thoracentesis but was resumed by discharge. Her home metoprolol  succinate 50mg  daily was was decreased to 12.5 mg daily by discharge due to orthostatic hypotension.   #CKD stage III Creatinine fluctuating this admission but remained mildly elevated from baseline of 0.9-1.0. Recommend repeat BMP outpatient.   #T2DM Most recent Hgb A1c 5.3% in 09/2023. Blood glucose was stable between 105-149 throughout admission. Her home Jardiance  was held at discharge in the setting of orthostatic hypotension.   #Gout Patient had no signs or symptoms of an acute gout flare. Her home allopurinol  100mg   daily was continued throughout hospitalization.   SUBJECTIVE:  Patient is feeling somewhat better this morning since her dizziness is not as severe, though she continues to have dizziness when standing or walking. She reports this is unchanged from home. She denies shortness of breath, nausea, or vomiting. She is ready to go home.  Discharge Vitals:   BP 122/89 (BP Location: Right Arm)   Pulse (!) 112   Temp 97.8 F (36.6 C) (Oral)   Resp 20   Ht 5' 5 (1.651 m)   Wt 67.4 kg   SpO2 99%   BMI 24.73 kg/m   OBJECTIVE:   General: Older-appearing female sitting upright in hospital bed, appears comfortable and in NAD. HEENT: Normocephalic and atraumatic. Conjunctivae clear. Some rhinorrhea with sneezing. Cardiovascular: Tachycardic rate with irregular rhythm. No murmurs, rubs, or gallops. Pulmonary: Difficult to assess due to poor respiratory effort. Lungs clear to auscultation bilaterally with mild bibasilar crackles. Abdominal: Bowel sounds present. Neurological: Alert, responds to questions appropriately. Skin: Warm and dry.  Pertinent Labs, Studies, and Procedures:     Latest Ref Rng & Units 04/30/2024    2:34 AM 04/27/2024    1:00 PM 01/27/2024    5:50 AM  CBC  WBC 4.0 - 10.5 K/uL 6.9  6.2  6.7   Hemoglobin 12.0 - 15.0 g/dL 87.0  86.3  87.9   Hematocrit 36.0 - 46.0 % 41.0  43.0  38.8   Platelets 150 - 400 K/uL 159  172  177        Latest Ref Rng & Units 05/03/2024    7:47 AM 05/02/2024    2:28 AM 05/01/2024    3:03 AM  CMP  Glucose 70 - 99 mg/dL 885  896  890   BUN 8 - 23 mg/dL 19  23  22    Creatinine 0.44 - 1.00 mg/dL 8.92  8.79  8.65   Sodium 135 - 145 mmol/L 141  138  138   Potassium 3.5 - 5.1 mmol/L 4.5  4.3  4.2   Chloride 98 - 111 mmol/L 106  101  103   CO2 22 - 32 mmol/L 25  27  29    Calcium 8.9 - 10.3 mg/dL 8.9  8.4  8.2     DG CHEST PORT 1 VIEW Result Date: 04/28/2024 FINDINGS: Decrease in the size of left pleural effusion, post thoracentesis. No  pneumothorax. Stable cardiomediastinal silhouette. Atherosclerotic calcification of the aorta. No acute osseous pathology. IMPRESSION: Decrease in the size of left pleural effusion, post thoracentesis. No pneumothorax. Electronically Signed   By: Vanetta Chou M.D.   On: 04/28/2024 13:05   DG Chest 2 View Result Date: 04/27/2024 FINDINGS: Stable large left pleural effusion is noted. Right lung is clear. Stable cardiomediastinal silhouette. Bony thorax is unremarkable. IMPRESSION: Stable large left pleural effusion. Electronically Signed   By: Lynwood Landy Raddle M.D.   On: 04/27/2024 13:33     Signed: Damien Lease, DO Internal Medicine Resident: PGY-1  Please contact the on call pager at: [phone]

## 2024-05-02 NOTE — Progress Notes (Signed)
 Physical Therapy Treatment Patient Details Name: Anne Roach MRN: [id number] DOB: Jul 15, 1933 Today's Date: 05/02/2024   History of Present Illness 89 y.o. female referred to the ED by her cardiologist after echo resulted in reduced LV ejection fraction of 40-45% and hypoxia with O2 sat of 80%. Large Left pleural effusion; 6/12 thoracentesis PMH chronic pleural effusions, DM, A fib on Eliquis , OSA, CKD 3, HTN, colorectal Ca with partial colectomy, macular degeneration, chronic pain, CHF.    PT Comments  Patient received sitting edge of bed, daughter and RN present in room. Patient continues to have low BP and dizziness. She is agreeable to PT session. Patient stands with cga. BP soft throughout session, but no significant drop. She ambulated with RW 120 feet cga and required 1 seated rest due to not feeling well  ( dizzy). She will continue to benefit from skilled PT to improve activity tolerance.     If plan is discharge home, recommend the following: A little help with walking and/or transfers;A little help with bathing/dressing/bathroom;Assistance with cooking/housework;Assist for transportation;Help with stairs or ramp for entrance   Can travel by private vehicle      yes  Equipment Recommendations  None recommended by PT    Recommendations for Other Services       Precautions / Restrictions Precautions Precautions: Fall Recall of Precautions/Restrictions: Intact Precaution/Restrictions Comments: reports fell 3 days ago due to dizziness/imbalance Restrictions Weight Bearing Restrictions Per Provider Order: No     Mobility  Bed Mobility           Sit to supine: Contact guard assist   General bed mobility comments: patient received sitting edge of bed. Lethargic and generally unwell looking. Daughter present to translate. Patient speaks a little English ( Arabic)    Transfers Overall transfer level: Needs assistance Equipment used: Rolling walker (2 wheels)   Sit  to Stand: Contact guard assist                Ambulation/Gait Ambulation/Gait assistance: Contact guard assist Gait Distance (Feet): 120 Feet Assistive device: Rolling walker (2 wheels) Gait Pattern/deviations: Step-through pattern, Decreased step length - right, Decreased step length - left, Decreased stride length Gait velocity: decr     General Gait Details: Required 1 seated rest in hallway due to dizziness.   Stairs             Wheelchair Mobility     Tilt Bed    Modified Rankin (Stroke Patients Only)       Balance Overall balance assessment: Needs assistance, History of Falls Sitting-balance support: Feet supported Sitting balance-Leahy Scale: Good     Standing balance support: Bilateral upper extremity supported, During functional activity, Reliant on assistive device for balance Standing balance-Leahy Scale: Fair                              Communication Communication Communication: Impaired Factors Affecting Communication: Non - English speaking, interpreter not available;Reduced clarity of speech;Hearing impaired  Cognition Arousal: Alert Behavior During Therapy: Flat affect   PT - Cognitive impairments: Problem solving Difficult to assess due to: Level of arousal                       Following commands: Impaired Following commands impaired: Follows one step commands with increased time    Cueing Cueing Techniques: Verbal cues, Gestural cues  Exercises      General Comments  Pertinent Vitals/Pain Pain Assessment Pain Assessment: No/denies pain    Home Living                          Prior Function            PT Goals (current goals can now be found in the care plan section) Acute Rehab PT Goals Patient Stated Goal: return home, feel better PT Goal Formulation: With patient/family Time For Goal Achievement: 05/12/24 Potential to Achieve Goals: Fair Progress towards PT goals:  Progressing toward goals    Frequency    Min 2X/week      PT Plan      Co-evaluation              AM-PAC PT 6 Clicks Mobility   Outcome Measure  Help needed turning from your back to your side while in a flat bed without using bedrails?: A Little Help needed moving from lying on your back to sitting on the side of a flat bed without using bedrails?: A Little Help needed moving to and from a bed to a chair (including a wheelchair)?: A Little Help needed standing up from a chair using your arms (e.g., wheelchair or bedside chair)?: A Little Help needed to walk in hospital room?: A Little Help needed climbing 3-5 steps with a railing? : A Lot 6 Click Score: 17    End of Session   Activity Tolerance: Patient limited by fatigue;Other (comment) (dizziness) Patient left: in bed;with call bell/phone within reach;with family/visitor present Nurse Communication: Mobility status PT Visit Diagnosis: Repeated falls (R29.6);Difficulty in walking, not elsewhere classified (R26.2);Muscle weakness (generalized) (M62.81)     Time: 6045-4098 PT Time Calculation (min) (ACUTE ONLY): 18 min  Charges:    $Gait Training: 8-22 mins PT General Charges $$ ACUTE PT VISIT: 1 Visit                     Art Levan, PT, GCS 05/02/24,3:13 PM

## 2024-05-02 NOTE — Plan of Care (Signed)

## 2024-05-03 ENCOUNTER — Other Ambulatory Visit (HOSPITAL_COMMUNITY): Payer: Self-pay

## 2024-05-03 DIAGNOSIS — I5021 Acute systolic (congestive) heart failure: Secondary | ICD-10-CM | POA: Diagnosis present

## 2024-05-03 DIAGNOSIS — Z833 Family history of diabetes mellitus: Secondary | ICD-10-CM | POA: Diagnosis not present

## 2024-05-03 DIAGNOSIS — N1832 Chronic kidney disease, stage 3b: Secondary | ICD-10-CM | POA: Diagnosis present

## 2024-05-03 DIAGNOSIS — Z7984 Long term (current) use of oral hypoglycemic drugs: Secondary | ICD-10-CM | POA: Diagnosis not present

## 2024-05-03 DIAGNOSIS — J9621 Acute and chronic respiratory failure with hypoxia: Secondary | ICD-10-CM | POA: Diagnosis present

## 2024-05-03 DIAGNOSIS — I10 Essential (primary) hypertension: Secondary | ICD-10-CM

## 2024-05-03 DIAGNOSIS — I272 Pulmonary hypertension, unspecified: Secondary | ICD-10-CM | POA: Diagnosis present

## 2024-05-03 DIAGNOSIS — Z79899 Other long term (current) drug therapy: Secondary | ICD-10-CM | POA: Diagnosis not present

## 2024-05-03 DIAGNOSIS — Z7901 Long term (current) use of anticoagulants: Secondary | ICD-10-CM | POA: Diagnosis not present

## 2024-05-03 DIAGNOSIS — M109 Gout, unspecified: Secondary | ICD-10-CM | POA: Diagnosis present

## 2024-05-03 DIAGNOSIS — I130 Hypertensive heart and chronic kidney disease with heart failure and stage 1 through stage 4 chronic kidney disease, or unspecified chronic kidney disease: Secondary | ICD-10-CM | POA: Diagnosis present

## 2024-05-03 DIAGNOSIS — R0602 Shortness of breath: Secondary | ICD-10-CM | POA: Diagnosis not present

## 2024-05-03 DIAGNOSIS — W19XXXA Unspecified fall, initial encounter: Secondary | ICD-10-CM | POA: Diagnosis present

## 2024-05-03 DIAGNOSIS — I5043 Acute on chronic combined systolic (congestive) and diastolic (congestive) heart failure: Secondary | ICD-10-CM | POA: Diagnosis not present

## 2024-05-03 DIAGNOSIS — I4821 Permanent atrial fibrillation: Secondary | ICD-10-CM | POA: Diagnosis present

## 2024-05-03 DIAGNOSIS — J918 Pleural effusion in other conditions classified elsewhere: Secondary | ICD-10-CM | POA: Diagnosis present

## 2024-05-03 DIAGNOSIS — E860 Dehydration: Secondary | ICD-10-CM | POA: Diagnosis present

## 2024-05-03 DIAGNOSIS — D649 Anemia, unspecified: Secondary | ICD-10-CM | POA: Diagnosis present

## 2024-05-03 DIAGNOSIS — I951 Orthostatic hypotension: Secondary | ICD-10-CM | POA: Diagnosis present

## 2024-05-03 DIAGNOSIS — G4733 Obstructive sleep apnea (adult) (pediatric): Secondary | ICD-10-CM | POA: Diagnosis present

## 2024-05-03 DIAGNOSIS — J90 Pleural effusion, not elsewhere classified: Secondary | ICD-10-CM | POA: Diagnosis not present

## 2024-05-03 DIAGNOSIS — I5082 Biventricular heart failure: Secondary | ICD-10-CM | POA: Diagnosis present

## 2024-05-03 DIAGNOSIS — E1122 Type 2 diabetes mellitus with diabetic chronic kidney disease: Secondary | ICD-10-CM | POA: Diagnosis present

## 2024-05-03 LAB — BASIC METABOLIC PANEL WITH GFR
Anion gap: 10 (ref 5–15)
BUN: 19 mg/dL (ref 8–23)
CO2: 25 mmol/L (ref 22–32)
Calcium: 8.9 mg/dL (ref 8.9–10.3)
Chloride: 106 mmol/L (ref 98–111)
Creatinine, Ser: 1.07 mg/dL — ABNORMAL HIGH (ref 0.44–1.00)
GFR, Estimated: 50 mL/min — ABNORMAL LOW (ref >60)
Glucose, Bld: 114 mg/dL — ABNORMAL HIGH (ref 70–99)
Potassium: 4.5 mmol/L (ref 3.5–5.1)
Sodium: 141 mmol/L (ref 135–145)

## 2024-05-03 LAB — CORTISOL: Cortisol, Plasma: 18.9 ug/dL

## 2024-05-03 LAB — CYTOLOGY - NON PAP

## 2024-05-03 LAB — GLUCOSE, CAPILLARY
Glucose-Capillary: 145 mg/dL — ABNORMAL HIGH (ref 70–99)
Glucose-Capillary: 123 mg/dL — ABNORMAL HIGH (ref 70–99)

## 2024-05-03 MED ORDER — METOPROLOL SUCCINATE ER 25 MG PO TB24
12.5000 mg | ORAL_TABLET | Freq: Every day | ORAL | Status: DC
  Administered 2024-05-03: 12.5 mg via ORAL
  Filled 2024-05-03: qty 1

## 2024-05-03 MED ORDER — FUROSEMIDE 20 MG PO TABS: 20.0000 mg | ORAL_TABLET | Freq: Every day | ORAL | 3 refills | Status: DC | PRN

## 2024-05-03 MED ORDER — ENSURE PLUS HIGH PROTEIN PO LIQD: 237.0000 mL | Freq: Two times a day (BID) | ORAL | 0 refills | Status: DC

## 2024-05-03 MED ORDER — ENSURE PLUS HIGH PROTEIN PO LIQD
237.0000 mL | Freq: Two times a day (BID) | ORAL | 0 refills | Status: AC
Start: 2024-05-03 — End: ?
  Filled 2024-05-03: qty 14220, 30d supply, fill #0

## 2024-05-03 MED ORDER — FUROSEMIDE 20 MG PO TABS
20.0000 mg | ORAL_TABLET | Freq: Every day | ORAL | 3 refills | Status: AC | PRN
  Filled 2024-05-03: qty 90, 90d supply, fill #0

## 2024-05-03 MED ORDER — METOPROLOL SUCCINATE ER 25 MG PO TB24
12.5000 mg | ORAL_TABLET | Freq: Every day | ORAL | 0 refills | Status: AC
  Filled 2024-05-03: qty 15, 30d supply, fill #0

## 2024-05-03 NOTE — Progress Notes (Addendum)
 Occupational Therapy Treatment Patient Details Name: Anne Roach MRN: [id number] DOB: [birth year] Today's Date: 05/03/2024   History of present illness 89 yr old female referred to the ED by her cardiologist after echo resulted in reduced LV ejection fraction of 40-45% and hypoxia with O2 sat of 80%. Large Left pleural effusion; s/p thoracentesis. PMH chronic pleural effusions, DM, a fib, OSA, CKD 3, HTN, colorectal CA with partial colectomy, macular degeneration, chronic pain, CHF.   OT comments  The pt was seen for functional strengthening and to attempt progression of functional activity, including participation in self-care tasks. The pt was assisted into sitting edge of bed where she presented to good sitting balance. She further required CGA to stand using a RW. Pt's blood pressure was taken, per request of pt's nurse. Her blood pressure was as follows, 109/66 supine and 95/74 sitting edge of bed. Shortly after the pt was assisted into standing using a RW, she presented with increased lightheadedness and dizziness; as such, it was not safe to attempt further out of bed activity. The pt's daughter stated such symptoms have been ongoing for the past few days. The pt was further noted to be with a short episode of decreased alertness, after standing and being assisted back into sitting edge of bed. Given the pt's ongoing symptoms, OT anticipates the pt would have significant difficulty safely participating in and completing self-care tasks at home. The pt's daughter declined SNF rehab & stated she plans to take the pt home at discharge. If the pt returns home at discharge, she will needed consistent care and supervision, until her symptoms improve, as well as regular and close monitoring of her blood pressure and symptoms.       If plan is discharge home, recommend the following:  Assistance with cooking/housework;Help with stairs or ramp for entrance;A little help with walking and/or transfers;A  lot of help with bathing/dressing/bathroom   Equipment Recommendations  None recommended by OT    Recommendations for Other Services      Precautions / Restrictions Precautions Precautions: Fall Restrictions Weight Bearing Restrictions Per Provider Order: No Other Position/Activity Restrictions: monitor blood pressure       Mobility Bed Mobility Overal bed mobility: Needs Assistance Bed Mobility: Supine to Sit, Sit to Supine     Supine to sit: Min assist Sit to supine: Contact guard assist        Transfers Overall transfer level: Needs assistance Equipment used: Rolling walker (2 wheels) Transfers: Sit to/from Stand Sit to Stand: Contact guard assist, From elevated surface                 Balance     Sitting balance-Leahy Scale: Good         Standing balance comment: static standing-fair+                           ADL either performed or assessed with clinical judgement   ADL Overall ADL's : Needs assistance/impaired                     Lower Body Dressing: Minimal assistance;Sitting/lateral leans Lower Body Dressing Details (indicate cue type and reason): Simulated for doffing and donning socks with the pt seated EOB                      Cognition Arousal: Alert Behavior During Therapy: Flat affect Cognition: Difficult to assess Difficult to assess due to:  Level of arousal           OT - Cognition Comments: Oriented to person, place, month when 3 options provided, and year. Slightly delayed initiation of tasks                   Following commands impaired: Follows one step commands with increased time                    Pertinent Vitals/ Pain       Pain Assessment Pain Assessment: 0-10 Pain Score: 8  Pain Location: R knee and feet (chronic pain) Pain Intervention(s): Monitored during session, RN gave pain meds during session   Frequency  Min 2X/week        Progress Toward Goals  OT  Goals(current goals can now be found in the care plan section)     Acute Rehab OT Goals OT Goal Formulation: With patient/family Time For Goal Achievement: 05/12/24 Potential to Achieve Goals: Good  Plan         AM-PAC OT 6 Clicks Daily Activity     Outcome Measure   Help from another person eating meals?: None Help from another person taking care of personal grooming?: A Little Help from another person toileting, which includes using toliet, bedpan, or urinal?: A Lot Help from another person bathing (including washing, rinsing, drying)?: A Lot Help from another person to put on and taking off regular upper body clothing?: A Little Help from another person to put on and taking off regular lower body clothing?: A Little 6 Click Score: 17    End of Session Equipment Utilized During Treatment: Rolling walker (2 wheels)  OT Visit Diagnosis: Muscle weakness (generalized) (M62.81)   Activity Tolerance Other (comment) (Limited by lightheadedness and dizziness with activity)   Patient Left in bed;with call bell/phone within reach;with nursing/sitter in room   Nurse Communication Other (comment) (pt presenting with dizziness, lightheadedness, and decreased alertness with activity)        Time: 1610-9604 OT Time Calculation (min): 30 min  Charges: OT General Charges $OT Visit: 1 Visit OT Treatments $Therapeutic Activity: 23-37 mins    Sheralyn Dies, OTR/L 05/03/2024, 2:16 PM

## 2024-05-03 NOTE — Plan of Care (Signed)
  Problem: Clinical Measurements: Goal: Ability to maintain clinical measurements within normal limits will improve Outcome: Progressing Goal: Will remain free from infection Outcome: Progressing Goal: Diagnostic test results will improve Outcome: Progressing Goal: Respiratory complications will improve Outcome: Progressing Goal: Cardiovascular complication will be avoided Outcome: Progressing   Problem: Activity: Goal: Risk for activity intolerance will decrease Outcome: Progressing   Problem: Coping: Goal: Level of anxiety will decrease Outcome: Progressing   Problem: Elimination: Goal: Will not experience complications related to bowel motility Outcome: Progressing Goal: Will not experience complications related to urinary retention Outcome: Progressing   Problem: Pain Managment: Goal: General experience of comfort will improve and/or be controlled Outcome: Progressing   Problem: Safety: Goal: Ability to remain free from injury will improve Outcome: Progressing   Problem: Safety: Goal: Ability to remain free from injury will improve Outcome: Progressing   Problem: Skin Integrity: Goal: Risk for impaired skin integrity will decrease Outcome: Progressing

## 2024-05-03 NOTE — TOC Transition Note (Signed)
 Transition of Care [facility]) - Discharge Note   Patient Details  Name: Anne Roach MRN: [id number] Date of Birth: [birth year]  Transition of [facility]) CM/SW Contact:  Jennett Model, RN Phone Number: 05/03/2024, 1:38 PM   Clinical Narrative:    For dc today, NCM notified [place] with [employer].  She has transportation.          Patient Goals and CMS Choice            Discharge Placement                       Discharge Plan and Services Additional resources added to the After Visit Summary for                                       Social Drivers of Health (SDOH) Interventions SDOH Screenings   Food Insecurity: No Food Insecurity (04/28/2024)  Housing: Low Risk  (05/03/2024)  Transportation Needs: No Transportation Needs (04/28/2024)  Utilities: Not At Risk (04/28/2024)  Alcohol Screen: Low Risk  (03/28/2022)  Financial Resource Strain: Low Risk  (03/28/2022)  Social Connections: Unknown (05/03/2024)  Tobacco Use: Low Risk  (04/29/2024)     Readmission Risk Interventions    10/12/2023    1:15 PM  Readmission Risk Prevention Plan  Transportation Screening Complete  PCP or Specialist Appt within 3-5 Days Complete  HRI or Home Care Consult Complete  Social Work Consult for Recovery Care Planning/Counseling Complete  Palliative Care Screening Not Applicable  Medication Review [employer]) Complete

## 2024-05-04 LAB — CULTURE, BODY FLUID W GRAM STAIN -BOTTLE: Culture: NO GROWTH

## 2024-06-21 NOTE — ED Provider Notes (Signed)
 Patient placed in First Look pathway, seen and evaluated for chief complaint of right knee injury from a fall few days ago. Went by orthopedic due to needing knee drained under sedation and r/o of septic knee. Pertinent exam findings include right knee swelling with bruising and redness. Based on initial evaluation, labs are currently indicated and radiology studies are currently indicated as allowed for current processes and treatments as applicable in a triage setting and could be different than if patient were seen in a main treatment area or dependent on labs/imagining after results are displayed.  Patient counseled on process, plan, and necessity for staying for completing the evaluation.   This document serves as a record of services personally performed by Tamea Ford PA-C.     [facility] Emergency Department Provider Note  MRN: [id number] Chief Complaint:  Chief Complaint  Patient presents with  . Knee Pain    History and Review of Systems  Anne Roach is a 89 y.o. year-old female with a medical history discussed below presenting to the ED with chief complaint as above.   Patient presents on referral from orthopedics with right knee injury and pain. Patient had a fall 8/1 while walking in her bedroom, landing on her right knee and right arm. She was evaluated at an UC and XR knee negative. Forearm pain has improved, but knee pain has been persistent. She is on Eliquis . No fevers, chills, or other infectious symptoms.  Review of Systems A pertinent review of systems was obtained and was negative except as noted in the HPI and MDM. Surgical History[1] Medical History[2]  Physical Exam  Physical Exam Vitals and nursing note reviewed.  Constitutional:      Appearance: She is not toxic-appearing or diaphoretic.  HENT:     Head: Normocephalic and atraumatic.   Cardiovascular:     Rate and Rhythm: Normal rate and regular rhythm.  Pulmonary:     Effort: Pulmonary  effort is normal. No respiratory distress.   Musculoskeletal:        General: Swelling, tenderness and signs of injury present.     Cervical back: Normal range of motion and neck supple. No rigidity.     Comments: Knee is generally swollen with ecchymosis anteriorly, a central scab, and surrounding erythema. Tenderness to palpation throughout entire anterior knee, and significant pain with passive flexion. See image below.   Skin:    General: Skin is warm and dry.     Capillary Refill: Capillary refill takes less than 2 seconds.   Neurological:     Mental Status: She is alert.       Procedures   Procedures  ED MEDS: Medications  oxyCODONE  (ROXICODONE ) immediate release tablet 5 mg (5 mg oral Given 06/21/24 2015)  acetaminophen  (TYLENOL ) tablet 1,000 mg (1,000 mg oral Given 06/21/24 2015)      Medical Decision Making   ED Course as of 06/21/24 2019  Tue Jun 21, 2024  2008 Patient is hemodynamically stable and well-appearing.  No fever tachycardia hypotension or other signs of significant infection.  Patient had a traumatic injury that caused swelling in her right knee.  It was only a few days ago.  She is on a blood thinner which likely contributed to more significant swelling.  She does not have any significant overlying redness or warmth.  She does have pain with passive range of motion and mildly elevated inflammatory markers at 51 and 77. [AZ]  2010 I discussed with the patient the possibility of  arthrocentesis and she states that she does not want this procedure done and even if it is recommended by orthopedics she will likely decline the procedure.  Her pain is improving since even being in the ER and she no longer has pain at rest but just with movement which is an improvement from prior. [AZ]  2016 Patient remains afebrile.  No leukocytosis on labs.  Hemoglobin stable.  CMP without any significant AKI, acidosis or other derangements.  She does have a significantly elevated ESR  and CRP with a sed rate of 77 and CRP of 51.  However in the setting of trauma no fever no white count no excessive warmth of the knee I have a very high suspicion this is a traumatic effusion and not a septic joint.  I discussed with Dr. Lewanda of orthopedics who agrees that if no signs of systemic illness would not pursue arthrocentesis.  I will discharge the patient with pain control and plan for her to follow-up with orthopedics in the outpatient setting.  If she develops any worsening pain, swelling, fever or redness that is spreading she will come urgently back to the ER. [AZ]  2018 Strict return precautions were provided to the patient. Patient was encouraged to return to the ED should they experience worsening or persistence of current symptoms, or should they develop new concerning symptoms including but not limited to chest pain, shortness of breath, dizziness, syncope, focal weakness, or inability to tolerate po. Encouraged them to f/u with their PCP on an outpatient basis. Questions regarding the diagnosis were answered, and side effects regarding therapies were provided in writing or orally. Patient discharged in stable condition.  [AZ]    ED Course User Index [AZ] Paulina Earnie Easterly, MD    Ddx includes but not limited to: traumatic effusion, septic joint, bursitis, septic bursitis  Reviewed Sports Med consult from earlier today for right anterior knee pain.  Clinical Complexity:  I reviewed nursing and triage notes  I personally ordered and reviewed the patient's Labs  and Imaging and my interpretation is as above in ED course  Interventions ordered and performed by myself as noted in ED course  Patient management required discussion with the following services or consulting groups as above in ED course: orhopedics   Past medical/surgical history that increases complexity of ED encounter:  pAF, chronic diastolic CHF, aortic atherosclerosis, current anticoagulation on Eliquis , DM, HTN,  OSA, gout attack, tremor Further history obtained from: Family  and Clinic note as stated in MDM and HPI  Factors Impacting ED Encounter Risk: Discussion regarding prescription drug management  Patient's presentation is most consistent with acute presentation with potential threat to life or bodily function.  Final Clinical Impressions(s) 1. Knee swelling     ED Disposition:  Discharge   ED Prescriptions     Medication Sig Dispense Start Date End Date Auth. Provider   oxyCODONE  (ROXICODONE ) 5 mg immediate release tablet Take 1 tablet (5 mg total) by mouth every 6 (six) hours as needed for severe pain (7-10). 20 tablet 06/21/2024 06/26/2024 Paulina Earnie Easterly, MD       This document serves as a record of services personally performed by Dr. Paulina Easterly, MD. It was created on their behalf by Chiquita DELENA Mirza, Scribe, a trained medical scribe. The creation of this record is the provider's dictation and/or activities during the visit.   Electronically signed by: Chiquita DELENA Mirza, Scribe 06/21/2024 8:19 PM         [1] Past  Surgical History: Procedure Laterality Date  . CARPAL TUNNEL RELEASE     Procedure: CARPAL TUNNEL RELEASE  . CATARACT EXTRACTION W/  INTRAOCULAR LENS IMPLANT Left 05/16/2014   Procedure: PHACOEMULSIFICATION W/ IOL;  Surgeon: Lynwood Gwenn Ned, MD;  Location[facility] MAIN OR;  Service: Ophthalmology;  Laterality: Left;  . CATARACT EXTRACTION W/  INTRAOCULAR LENS IMPLANT Right 06/13/2014   Procedure: PHACOEMULSIFICATION W/ IOL;  Surgeon: Lynwood Gwenn Ned, MD;  Location: [facility] MAIN OR;  Service: Ophthalmology;  Laterality: Right;  . HERNIA REPAIR     Procedure: HERNIA REPAIR; umbilical  . TONSILLECTOMY     Procedure: TONSILLECTOMY  [2] Past Medical History: Diagnosis Date  . DM (diabetes mellitus)    (CMD) 07/09/2016   Dx 2013- most recent HgbA1c 7.6% on 01/2016; fasting glucose levels 120-160  . Exudative age-related macular degeneration of right eye with  active choroidal neovascularization    (CMD) 08/29/2014  . Gout attack    acute (great toe)- uric acid level 7.4, ESR 47 on 02/25/16- now resolved   . HTN (hypertension) 07/09/2016   normal BP 130-140/60's  . Intermediate stage nonexudative age-related macular degeneration of left eye 08/29/2014  . Memory loss    mild  . Myogenic ptosis of bilateral eyelids 06/24/2016   Added automatically from request for surgery [id number]  . OSA (obstructive sleep apnea) 06/29/2012   Overview:  Split 01/2012:  AHI 87/hr, high pressure needs and optimal pressure not reached during titration phase.  Auto 2013:  Optimal pressure 17cm Pt could only tolerate 14cm, and download 2014 shows adequate control at this pressure.   Last Assessment & Plan:  The patient is currently doing well with CPAP, and is having no mask or pressure issues.  She has seen improvement in her symptoms.  I ha  . Other diseases of lung, not elsewhere classified 03/10/2009   Overview:  Qualifier: Diagnosis of  By: Delford, MD, CODY Maude Dunnings (chest pain, palpitations with DOE 02/27/09 per outside records [facility]- negative Myocardial PErfusion Study- EF 67%)  . Pseudophakia of both eyes 08/29/2014  . Tremor 07/09/2016   mouth- lower jaw

## 2024-08-09 ENCOUNTER — Other Ambulatory Visit: Payer: Self-pay

## 2024-08-09 ENCOUNTER — Emergency Department (HOSPITAL_COMMUNITY)

## 2024-08-09 ENCOUNTER — Encounter (HOSPITAL_COMMUNITY): Payer: Self-pay

## 2024-08-09 ENCOUNTER — Observation Stay (HOSPITAL_COMMUNITY)
Admission: EM | Admit: 2024-08-09 | Discharge: 2024-08-12 | Disposition: A | Discharge status: Home Health | Attending: Family Medicine | Admitting: Family Medicine

## 2024-08-09 DIAGNOSIS — K3184 Gastroparesis: Secondary | ICD-10-CM | POA: Insufficient documentation

## 2024-08-09 DIAGNOSIS — I110 Hypertensive heart disease with heart failure: Secondary | ICD-10-CM | POA: Insufficient documentation

## 2024-08-09 DIAGNOSIS — I482 Chronic atrial fibrillation, unspecified: Secondary | ICD-10-CM | POA: Insufficient documentation

## 2024-08-09 DIAGNOSIS — M50122 Cervical disc disorder at C5-C6 level with radiculopathy: Secondary | ICD-10-CM | POA: Diagnosis not present

## 2024-08-09 DIAGNOSIS — I4891 Unspecified atrial fibrillation: Secondary | ICD-10-CM | POA: Diagnosis present

## 2024-08-09 DIAGNOSIS — E1159 Type 2 diabetes mellitus with other circulatory complications: Secondary | ICD-10-CM

## 2024-08-09 DIAGNOSIS — I700 Atherosclerosis of aorta: Secondary | ICD-10-CM | POA: Insufficient documentation

## 2024-08-09 DIAGNOSIS — I5021 Acute systolic (congestive) heart failure: Secondary | ICD-10-CM | POA: Diagnosis present

## 2024-08-09 DIAGNOSIS — M25561 Pain in right knee: Secondary | ICD-10-CM | POA: Insufficient documentation

## 2024-08-09 DIAGNOSIS — Z7984 Long term (current) use of oral hypoglycemic drugs: Secondary | ICD-10-CM | POA: Insufficient documentation

## 2024-08-09 DIAGNOSIS — E119 Type 2 diabetes mellitus without complications: Secondary | ICD-10-CM | POA: Diagnosis not present

## 2024-08-09 DIAGNOSIS — Z79899 Other long term (current) drug therapy: Secondary | ICD-10-CM | POA: Insufficient documentation

## 2024-08-09 DIAGNOSIS — K862 Cyst of pancreas: Secondary | ICD-10-CM | POA: Insufficient documentation

## 2024-08-09 DIAGNOSIS — G4733 Obstructive sleep apnea (adult) (pediatric): Secondary | ICD-10-CM | POA: Diagnosis not present

## 2024-08-09 DIAGNOSIS — J90 Pleural effusion, not elsewhere classified: Secondary | ICD-10-CM | POA: Diagnosis not present

## 2024-08-09 DIAGNOSIS — M109 Gout, unspecified: Secondary | ICD-10-CM | POA: Diagnosis not present

## 2024-08-09 DIAGNOSIS — R410 Disorientation, unspecified: Secondary | ICD-10-CM | POA: Diagnosis present

## 2024-08-09 DIAGNOSIS — G9341 Metabolic encephalopathy: Principal | ICD-10-CM | POA: Insufficient documentation

## 2024-08-09 DIAGNOSIS — R296 Repeated falls: Secondary | ICD-10-CM | POA: Insufficient documentation

## 2024-08-09 DIAGNOSIS — Z7901 Long term (current) use of anticoagulants: Secondary | ICD-10-CM | POA: Diagnosis not present

## 2024-08-09 DIAGNOSIS — Z9181 History of falling: Secondary | ICD-10-CM | POA: Insufficient documentation

## 2024-08-09 DIAGNOSIS — Z9049 Acquired absence of other specified parts of digestive tract: Secondary | ICD-10-CM | POA: Insufficient documentation

## 2024-08-09 DIAGNOSIS — G9340 Encephalopathy, unspecified: Principal | ICD-10-CM | POA: Insufficient documentation

## 2024-08-09 DIAGNOSIS — I5023 Acute on chronic systolic (congestive) heart failure: Secondary | ICD-10-CM | POA: Diagnosis not present

## 2024-08-09 DIAGNOSIS — Z85048 Personal history of other malignant neoplasm of rectum, rectosigmoid junction, and anus: Secondary | ICD-10-CM | POA: Diagnosis not present

## 2024-08-09 DIAGNOSIS — R627 Adult failure to thrive: Secondary | ICD-10-CM

## 2024-08-09 DIAGNOSIS — R0989 Other specified symptoms and signs involving the circulatory and respiratory systems: Secondary | ICD-10-CM | POA: Diagnosis present

## 2024-08-09 DIAGNOSIS — I5022 Chronic systolic (congestive) heart failure: Secondary | ICD-10-CM | POA: Insufficient documentation

## 2024-08-09 LAB — CBC
HCT: 40.6 % (ref 36.0–46.0)
Hemoglobin: 12.5 g/dL (ref 12.0–15.0)
MCH: 30.3 pg (ref 26.0–34.0)
MCHC: 30.8 g/dL (ref 30.0–36.0)
MCV: 98.3 fL (ref 80.0–100.0)
Platelets: 225 K/uL (ref 150–400)
RBC: 4.13 MIL/uL (ref 3.87–5.11)
RDW: 15.2 % (ref 11.5–15.5)
WBC: 7.6 K/uL (ref 4.0–10.5)
nRBC: 0 % (ref 0.0–0.2)

## 2024-08-09 LAB — COMPREHENSIVE METABOLIC PANEL WITH GFR
ALT: <5 U/L (ref 0–44)
AST: 16 U/L (ref 15–41)
Albumin: 4 g/dL (ref 3.5–5.0)
Alkaline Phosphatase: 50 U/L (ref 38–126)
Anion gap: 13 (ref 5–15)
BUN: 22 mg/dL (ref 8–23)
CO2: 25 mmol/L (ref 22–32)
Calcium: 9.9 mg/dL (ref 8.9–10.3)
Chloride: 104 mmol/L (ref 98–111)
Creatinine, Ser: 1.01 mg/dL — ABNORMAL HIGH (ref 0.44–1.00)
GFR, Estimated: 53 mL/min — ABNORMAL LOW (ref >60)
Glucose, Bld: 146 mg/dL — ABNORMAL HIGH (ref 70–99)
Potassium: 4 mmol/L (ref 3.5–5.1)
Sodium: 142 mmol/L (ref 135–145)
Total Bilirubin: 0.6 mg/dL (ref 0.0–1.2)
Total Protein: 7.8 g/dL (ref 6.5–8.1)

## 2024-08-09 LAB — URINALYSIS, ROUTINE W REFLEX MICROSCOPIC
Bilirubin Urine: NEGATIVE
Glucose, UA: NEGATIVE mg/dL
Hgb urine dipstick: NEGATIVE
Ketones, ur: NEGATIVE mg/dL
Nitrite: NEGATIVE
Protein, ur: 30 mg/dL — AB
Specific Gravity, Urine: 1.019 (ref 1.005–1.030)
pH: 7 (ref 5.0–8.0)

## 2024-08-09 LAB — PRO BRAIN NATRIURETIC PEPTIDE: Pro Brain Natriuretic Peptide: 4884 pg/mL — ABNORMAL HIGH (ref <300.0)

## 2024-08-09 LAB — CBG MONITORING, ED: Glucose-Capillary: 127 mg/dL — ABNORMAL HIGH (ref 70–99)

## 2024-08-09 LAB — RESP PANEL BY RT-PCR (RSV, FLU A&B, COVID)  RVPGX2
Influenza A by PCR: NEGATIVE
Influenza B by PCR: NEGATIVE
Resp Syncytial Virus by PCR: NEGATIVE
SARS Coronavirus 2 by RT PCR: NEGATIVE

## 2024-08-09 LAB — TROPONIN T, HIGH SENSITIVITY: Troponin T High Sensitivity: 17 ng/L (ref 0–19)

## 2024-08-09 MED ORDER — ACETAMINOPHEN 10 MG/ML IV SOLN
1000.0000 mg | Freq: Once | INTRAVENOUS | Status: AC
  Filled 2024-08-09: qty 100

## 2024-08-09 MED ORDER — ACETAMINOPHEN 650 MG RE SUPP: 650.0000 mg | Freq: Four times a day (QID) | RECTAL | Status: DC | PRN

## 2024-08-09 MED ORDER — FENTANYL CITRATE PF 50 MCG/ML IJ SOSY
50.0000 ug | PREFILLED_SYRINGE | Freq: Once | INTRAMUSCULAR | Status: AC
  Administered 2024-08-09: 50 ug via INTRAVENOUS
  Filled 2024-08-09: qty 1

## 2024-08-09 MED ORDER — BISACODYL 5 MG PO TBEC: 5.0000 mg | DELAYED_RELEASE_TABLET | Freq: Every day | ORAL | Status: DC | PRN

## 2024-08-09 MED ORDER — SODIUM CHLORIDE 0.9 % IV SOLN
500.0000 mg | Freq: Once | INTRAVENOUS | Status: AC
  Administered 2024-08-09: 500 mg via INTRAVENOUS
  Filled 2024-08-09: qty 5

## 2024-08-09 MED ORDER — ACETAMINOPHEN 500 MG PO TABS: 500.0000 mg | ORAL_TABLET | Freq: Four times a day (QID) | ORAL | Status: DC | PRN

## 2024-08-09 MED ORDER — SODIUM CHLORIDE 0.9% FLUSH
3.0000 mL | Freq: Two times a day (BID) | INTRAVENOUS | Status: DC
  Administered 2024-08-10 – 2024-08-12 (×6): 3 mL via INTRAVENOUS

## 2024-08-09 MED ORDER — ONDANSETRON HCL 4 MG/2ML IJ SOLN
4.0000 mg | Freq: Four times a day (QID) | INTRAMUSCULAR | Status: DC
Start: 2024-08-10 — End: 2024-08-10
  Administered 2024-08-09 – 2024-08-10 (×2): 4 mg via INTRAVENOUS
  Filled 2024-08-09 (×3): qty 2

## 2024-08-09 MED ORDER — SODIUM CHLORIDE 0.9 % IV SOLN
1.0000 g | Freq: Once | INTRAVENOUS | Status: AC
  Administered 2024-08-09: 1 g via INTRAVENOUS
  Filled 2024-08-09: qty 10

## 2024-08-09 MED ORDER — IOHEXOL 300 MG/ML  SOLN
100.0000 mL | Freq: Once | INTRAMUSCULAR | Status: AC | PRN
Start: 2024-08-09 — End: 2024-08-09
  Administered 2024-08-09: 100 mL via INTRAVENOUS

## 2024-08-09 MED ORDER — MELATONIN 3 MG PO TABS: 3.0000 mg | ORAL_TABLET | Freq: Every evening | ORAL | Status: DC | PRN

## 2024-08-09 NOTE — ED Triage Notes (Signed)
 Pt has had more falls within this month and seems more confused. Pt complains of pain all over. Family reports that her urine is dark and she is not eating well.

## 2024-08-09 NOTE — ED Provider Notes (Signed)
 [facility] AT [facility] Provider Note   CSN: [id number] Arrival date & time: 08/09/24  1455     Patient presents with: Anne Roach is a 89 y.o. female.   Patient with history of chronic afib on Eliquis , CHF, hypertension, diabetes presents today with complaints of weakness. History provided predominantly by daughter at bedside who is patients caregiver and who she lives with, states that she has had general deconditioning for the past month. States that she fell at the beginning of August and injured her knee which impaired her functional status. She has fallen several times since then. The last time was 1 week ago when she was walking with her walker, lost her balance and fell on her right side.  This was a witnessed fall, she did not hit her head or lose consciousness.  She was able to get up and walk since then, however her mobility since then has been even more limited. Reports that for the past week she has continued to become less independent. Daughter reports that she has been confused at times, particularly at night. She has never been evaluated for or diagnosed with dementia. Patient complains of pain all over without specific area of pain. Daughter reports that 2 days ago the patient completely stopped eating or drinking which was most concerning for her. She reports that last night her oxygen was in the high 80s and she placed her on oxygen which she has in place at home. She is currently on room air. Daughter denies any worsening cough or shortness of breath. No chest pain, fevers or chills. No nausea, vomiting, or diarrhea.  She is having regular bowel movements.  Did have some concern that her urine is darker than normal.  The history is provided by the patient. No language interpreter was used.  Fall       Prior to Admission medications   Medication Sig Start Date End Date Taking? Authorizing Provider  ACCU-CHEK AVIVA PLUS test  strip  10/06/20   [provider]  acetaminophen  (TYLENOL ) 500 MG tablet Take 1,000 mg by mouth every 6 (six) hours as needed for mild pain or headache.    [provider]  allopurinol  (ZYLOPRIM ) 100 MG tablet TAKE 1 TABLET(100 MG) BY MOUTH DAILY Patient taking differently: Take 100 mg by mouth every evening. 08/09/21   Janit Thresa HERO, DPM  Cholecalciferol  (VITAMIN D ) 125 MCG (5000 UT) CAPS Take 5,000 Units by mouth every evening.    [provider]  diclofenac  Sodium (VOLTAREN ) 1 % GEL Apply 4 g topically 4 (four) times daily. Patient taking differently: Apply 4 g topically daily as needed (for pain). 11/29/23   Emil Share, DO  ELIQUIS  5 MG TABS tablet TAKE 1 TABLET(5 MG) BY MOUTH TWICE DAILY 02/19/24   Nishan, Peter C, MD  empagliflozin  (JARDIANCE ) 10 MG TABS tablet TAKE 1 TABLET(10 MG) BY MOUTH DAILY 12/24/23   Nishan, Peter C, MD  feeding supplement (ENSURE PLUS HIGH PROTEIN) LIQD Take 237 mLs by mouth 2 (two) times daily between meals. 05/03/24   Kandis Perkins, DO  folic acid  (FOLVITE ) 800 MCG tablet Take 800 mcg by mouth daily.    [provider]  furosemide  (LASIX ) 20 MG tablet Take 1 tablet (20 mg total) by mouth daily as needed for fluid or edema. Please take one tablet if you have leg swelling, shortness of breath, gain more than 3 pounds in 24 hours or 5 pounds in one week  05/03/24   Kandis Perkins, DO  gabapentin  (NEURONTIN ) 400 MG capsule Take 400 mg by mouth 3 (three) times daily. 03/25/22   [provider]  ipratropium (ATROVENT ) 0.06 % nasal spray Place 2 sprays into both nostrils 2 (two) times daily as needed (nasal drainage). 02/17/24 04/27/24  Karis Clunes, MD  Iron-FA-B Cmp-C-Biot-Probiotic (FUSION PLUS) CAPS Take 1 capsule by mouth every other day. 08/26/23   [provider]  loratadine  (CLARITIN ) 10 MG tablet Take 10 mg by mouth daily as needed for allergies. Patient not taking: Reported on 03/23/2024 08/28/23   [provider]   metoprolol  succinate (TOPROL -XL) 25 MG 24 hr tablet Take 0.5 tablets (12.5 mg total) by mouth daily. Take with or immediately following a meal. 05/03/24   Kandis Perkins, DO  Multiple Vitamins-Minerals (ICAPS AREDS 2 PO) Take 1 capsule by mouth 2 (two) times daily.    [provider]  ondansetron  (ZOFRAN -ODT) 4 MG disintegrating tablet Take 4 mg by mouth every 8 (eight) hours as needed for nausea or vomiting. 12/26/23   [provider]  pantoprazole  (PROTONIX ) 40 MG tablet Take 40 mg by mouth every morning. 03/27/24   [provider]  Potassium Chloride  ER 20 MEQ TBCR Take 20 mEq by mouth daily. 04/09/22   Lenetta No D, NP    Allergies: Patient has no known allergies.    Review of Systems  All other systems reviewed and are negative.   Updated Vital Signs BP (!) 155/100   Pulse 92   Temp 98.1 F (36.7 C)   Resp 16   SpO2 99%   Physical Exam Vitals and nursing note reviewed.  Constitutional:      General: She is not in acute distress.    Appearance: Normal appearance. She is normal weight. She is ill-appearing. She is not toxic-appearing or diaphoretic.  HENT:     Head: Normocephalic and atraumatic.  Cardiovascular:     Rate and Rhythm: Normal rate and regular rhythm.     Heart sounds: Normal heart sounds.  Pulmonary:     Effort: Pulmonary effort is normal. No respiratory distress.  Abdominal:     General: Abdomen is flat.     Palpations: Abdomen is soft.     Tenderness: There is abdominal tenderness. There is no guarding or rebound.  Musculoskeletal:        General: Normal range of motion.     Cervical back: Normal range of motion.     Right lower leg: No edema.     Left lower leg: No edema.     Comments: Nonspecific generalized tenderness to palpation throughout, no specific areas of focal tenderness or deformity. No unilateral weakness  Skin:    General: Skin is warm and dry.  Neurological:     General: No focal deficit present.     Mental  Status: She is alert.  Psychiatric:        Mood and Affect: Mood normal.        Behavior: Behavior normal.     (all labs ordered are listed, but only abnormal results are displayed) Labs Reviewed  COMPREHENSIVE METABOLIC PANEL WITH GFR - Abnormal; Notable for the following components:      Result Value   Glucose, Bld 146 (*)    Creatinine, Ser 1.01 (*)    GFR, Estimated 53 (*)    All other components within normal limits  URINALYSIS, ROUTINE W REFLEX MICROSCOPIC - Abnormal; Notable for the following components:   APPearance HAZY (*)  Protein, ur 30 (*)    Leukocytes,Ua MODERATE (*)    Bacteria, UA RARE (*)    All other components within normal limits  CBG MONITORING, ED - Abnormal; Notable for the following components:   Glucose-Capillary 127 (*)    All other components within normal limits  RESP PANEL BY RT-PCR (RSV, FLU A&B, COVID)  [id number]  CBC    EKG: None  Radiology: CT Head Wo Contrast Result Date: 08/09/2024 EXAM: CT HEAD WITHOUT CONTRAST 08/09/2024 08:06:31 PM TECHNIQUE: CT of the head was performed without the administration of intravenous contrast. Automated exposure control, iterative reconstruction, and/or weight based adjustment of the mA/kV was utilized to reduce the radiation dose to as low as reasonably achievable. COMPARISON: None available. CLINICAL HISTORY: Head trauma, minor (Age >= 65y). Table formatting from the original note was not included.; Notes from triage:; Pt has had more falls within this month and seems more confused. Pt complains of pain all over. Family reports that her urine is dark and she is not eating well. FINDINGS: BRAIN AND VENTRICLES: Parenchymal volume loss is commensurate with the patient's age. Periventricular white matter changes are present likely reflecting the sequela of small vessel ischemia. No acute hemorrhage. No evidence of acute infarct. No hydrocephalus. No extra-axial collection. No mass effect or midline shift. ORBITS: No  acute abnormality. SINUSES: No acute abnormality. SOFT TISSUES AND SKULL: No acute soft tissue abnormality. No skull fracture. IMPRESSION: 1. No acute intracranial abnormality. 2. Parenchymal volume loss commensurate with the patient's age. 3. Periventricular white matter changes likely reflecting the sequela of small vessel ischemia. Electronically signed by: Dorethia Molt MD 08/09/2024 08:20 PM EDT RP Workstation: [id number]   CT Cervical Spine Wo Contrast Result Date: 08/09/2024 EXAM: CT CERVICAL SPINE WITHOUT CONTRAST 08/09/2024 08:06:31 PM TECHNIQUE: CT of the cervical spine was performed without the administration of intravenous contrast. Multiplanar reformatted images are provided for review. Automated exposure control, iterative reconstruction, and/or weight-based adjustment of the mA/kV was utilized to reduce the radiation dose to as low as reasonably achievable. COMPARISON: None available. CLINICAL HISTORY: Neck trauma (Age >= 65y). Patient has had more falls within this month and seems more confused. Patient complains of pain all over. Family reports that her urine is dark and she is not eating well. FINDINGS: CERVICAL SPINE: BONES AND ALIGNMENT: No acute fracture or listhesis. DEGENERATIVE CHANGES: Disc space narrowing and endplate remodeling throughout the cervical spine, most severe at C4-C6 in keeping with changes of advanced degenerative disc disease. 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 Multilevel uncovertebral and facet arthrosis is present with moderate to severe bilateral neural foraminal narrowing at C5-6. SOFT TISSUES: No prevertebral soft tissue swelling. A left pleural effusion is partially visualized within the left apex. Note: Right carotid bifurcation calcification is noted. IMPRESSION: 1. No acute fracture or listhesis. 2. Severe central  canal stenosis at C5-6 due to bulky ossification of the posterior longitudinal ligament, with flattening of the thecal sac and AP diameter of the spinal canal of 3 mm. 3. Advanced degenerative disc disease, most severe at C4-C6. 4. Multilevel uncovertebral and facet arthrosis with moderate to severe bilateral neural foraminal narrowing at C5-6. Electronically signed by: Dorethia Molt MD 08/09/2024 08:17 PM EDT RP Workstation: [id number]   CT ABDOMEN PELVIS W CONTRAST Result Date: 08/09/2024 CLINICAL DATA:  Acute nonlocalized abdominal pain. Increasing falls this month and  confusion. Pain all over. Dark urine. EXAM: CT ABDOMEN AND PELVIS WITH CONTRAST TECHNIQUE: Multidetector CT imaging of the abdomen and pelvis was performed using the standard protocol following bolus administration of intravenous contrast. RADIATION DOSE REDUCTION: This exam was performed according to the departmental dose-optimization program which includes automated exposure control, adjustment of the mA and/or kV according to patient size and/or use of iterative reconstruction technique. CONTRAST:  OMNIPAQUE  IOHEXOL  300 MG/ML  SOLN COMPARISON:  02/10/2024 FINDINGS: Lower chest: Large left pleural effusion with left lower lobe collapse. Cardiac enlargement with small pericardial effusion. Appearances are similar to the previous study. Hepatobiliary: No focal liver abnormality is seen. No gallstones, gallbladder wall thickening, or biliary dilatation. Pancreas: Diffuse pancreatic atrophy. Calcifications in the head of the pancreas consistent with chronic pancreatitis. Poorly defined cystic structure in the head of the pancreas measuring 1.2 cm diameter. No change since prior study. No acute infiltrative changes. Spleen: Normal in size without focal abnormality. Adrenals/Urinary Tract: No adrenal gland nodules. Renal nephrograms are symmetrical. Right renal cysts, largest measuring 2.1 cm diameter. No interval change. No imaging follow-up  indicated. No hydronephrosis or hydroureter. Bladder is normal. Stomach/Bowel: Stomach, small bowel, and colon are not abnormally distended. Previous right hemicolectomy with ileocolonic anastomosis. No wall thickening or inflammatory stranding identified. Vascular/Lymphatic: Aortic atherosclerosis. No enlarged abdominal or pelvic lymph nodes. Reproductive: Uterus and bilateral adnexa are unremarkable. Other: No abdominal wall hernia or abnormality. No abdominopelvic ascites. Musculoskeletal: Degenerative changes in the spine. No acute bony abnormalities. IMPRESSION: 1. Large left pleural effusion with collapse or consolidation of the left lower lung, similar to prior study. 2. Cardiac enlargement with small pericardial effusion. 3. No evidence of bowel obstruction or inflammation. 4. Cystic lesion in the head of the pancreas is unchanged since prior study. Due to patient's age, no imaging follow-up is indicated. Pancreatic calcifications likely representing chronic pancreatitis. 5. Aortic atherosclerosis. Electronically Signed   By: Elsie Gravely M.D.   On: 08/09/2024 20:14   DG Pelvis Portable Result Date: 08/09/2024 CLINICAL DATA:  Fall EXAM: PORTABLE PELVIS 1-2 VIEWS COMPARISON:  None Available. FINDINGS: There is no evidence of pelvic fracture or diastasis. No pelvic bone lesions are seen. IMPRESSION: Negative. Electronically Signed   By: Greig Pique M.D.   On: 08/09/2024 18:59   DG Chest Portable 1 View Result Date: 08/09/2024 CLINICAL DATA:  Fall EXAM: PORTABLE CHEST 1 VIEW COMPARISON:  Chest x-ray 04/29/2024 FINDINGS: There is a moderate left pleural effusion which has significantly increased from prior. Right lung is clear. There is no pneumothorax. Can not exclude underlying atelectasis/airspace disease in the left lung base. The heart is enlarged, unchanged. No acute osseous abnormality. IMPRESSION: Moderate left pleural effusion which has significantly increased from prior. Can not exclude  underlying atelectasis/airspace disease in the left lung base. Electronically Signed   By: Greig Pique M.D.   On: 08/09/2024 18:59     Procedures   Medications Ordered in the ED  azithromycin  (ZITHROMAX ) 500 mg in sodium chloride  0.9 % 250 mL IVPB (500 mg Intravenous New Bag/Given 08/09/24 2216)  iohexol  (OMNIPAQUE ) 300 MG/ML solution 100 mL (100 mLs Intravenous Contrast Given 08/09/24 1940)  cefTRIAXone  (ROCEPHIN ) 1 g in sodium chloride  0.9 % 100 mL IVPB (0 g Intravenous Stopped 08/09/24 2215)  fentaNYL  (SUBLIMAZE ) injection 50 mcg (50 mcg Intravenous Given 08/09/24 2214)  Medical Decision Making Amount and/or Complexity of Data Reviewed Labs: ordered. Radiology: ordered.  Risk Prescription drug management.   This patient is a 89 y.o. female who presents to the ED for concern of falls, weakness, this involves an extensive number of treatment options, and is a complaint that carries with it a high risk of complications and morbidity. The emergent differential diagnosis prior to evaluation includes, but is not limited to,  CVA, spinal cord injury, ACS, arrhythmia, syncope, orthostatic hypotension, sepsis, hypoglycemia, hypoxia, electrolyte disturbance, endocrine disorder, anemia, environmental exposure, polypharmacy  This is not an exhaustive differential.   Past Medical History / Co-morbidities / Social History:  has a past medical history of A-fib (HCC), Acute on chronic diastolic CHF (congestive heart failure) (HCC) (03/27/2022), Anemia, Arthritis, Atrial fibrillation (HCC), Atrial fibrillation with RVR (HCC) (03/20/2017), Cancer (HCC), CAP (community acquired pneumonia) (01/25/2024), Chronic diastolic CHF (congestive heart failure) (HCC) (10/31/2020), DM (diabetes mellitus) (HCC), HTN (hypertension), Occasional tremors, Sepsis secondary to UTI (HCC) (07/11/2020), Sleep apnea, and Tremor.  Additional history: Chart reviewed. Pertinent results include:  has recurrent pleural effusion, has had thoracentesis previously, last time in 6/25, looks like last time the effusion was attributed to the patients CHF.  Physical Exam: Physical exam performed. The pertinent findings include: generally unwell appearing, generalized abdominal tenderness to palpation without rebound or guarding  Lab Tests: I ordered, and personally interpreted labs.  The pertinent results include:  UA with moderate leukocytes, rare bacteria, 0-5 WBCs. No other acute laboratory abnormalities   Imaging Studies: I ordered imaging studies including CXR, DG pelvis, CT head, cervical spine, abdomen pelvis. I independently visualized and interpreted imaging which showed   CXR:  Moderate left pleural effusion which has significantly increased from prior. Can not exclude underlying atelectasis/airspace disease in the left lung base.  DG Pelvis: negative  CT head:  1. No acute intracranial abnormality. 2. Parenchymal volume loss commensurate with the patient's age. 3. Periventricular white matter changes likely reflecting the sequela of small vessel ischemia.  CT cervical spine:  1. No acute fracture or listhesis. 2. Severe central canal stenosis at C5-6 due to bulky ossification of the posterior longitudinal ligament, with flattening of the thecal sac and AP diameter of the spinal canal of 3 mm. 3. Advanced degenerative disc disease, most severe at C4-C6. 4. Multilevel uncovertebral and facet arthrosis with moderate to severe bilateral neural foraminal narrowing at C5-6.  CT abdomen pelvis: 1. Large left pleural effusion with collapse or consolidation of the left lower lung, similar to prior study. 2. Cardiac enlargement with small pericardial effusion. 3. No evidence of bowel obstruction or inflammation. 4. Cystic lesion in the head of the pancreas is unchanged since prior study. Due to patient's age, no imaging follow-up is indicated. Pancreatic calcifications likely  representing chronic pancreatitis. 5. Aortic atherosclerosis.  I agree with the radiologist interpretation.   Cardiac Monitoring:  The patient was maintained on a cardiac monitor.  My attending physician viewed and interpreted the cardiac monitored which showed an underlying rhythm of: afib, no STEMI. I agree with this interpretation.   Medications: I ordered medication including azithromycin , rocephin , fentanyl   for worsening pleural effusion, pain. Reevaluation of the patient after these medicines showed that the patient improved. I have reviewed the patients home medicines and have made adjustments as needed.   Disposition: After consideration of the diagnostic results and the patients response to treatment, I feel that patient will require admission for worsening left pleural effusion as well as generalized deconditioning and failure  to thrive. Discussed same with patient and daughter at bedside who are understanding and in agreement with this.   Discussed patient with hospitalist  Dr. Claiborne who accepts patient for admission.  This is a shared visit with supervising physician Dr. Randol who has independently evaluated patient & provided guidance in evaluation/management/disposition, in agreement with care   Final diagnoses:  Pleural effusion on left  Falls  Failure to thrive in adult    ED Discharge Orders     None          Nora Lauraine DELENA DEVONNA 08/09/24 2237    Randol Simmonds, MD 08/10/24 1126

## 2024-08-09 NOTE — H&P (Addendum)
 History and Physical    Patient: Anne Roach FMW:[id number] DOB: 10/26/1933 DOA: 08/09/2024 DOS: the patient was seen and examined on 08/09/2024 PCP: Ilah Crigler, MD  Patient coming from: Home  Chief Complaint:  Chief Complaint  Patient presents with   Fall   HPI: Anne Roach is a 89 y.o. female with medical history significant for bilateral pleural effusions status post thoracentesis on large left pleural effusion 3 months ago.  She has a history of permanent atrial fibrillation on Eliquis , CKD stage III, type 2 diabetes mellitus, esophageal dysmotility and aspiration, and heart failure with EF of 40%.  The patient is status post colorectal carcinoma and partial colectomy 09/2023.  The patient's daughter with whom she lives provides the history.  She reports that the patient fell about a week ago but was doing okay until yesterday.  Yesterday she started moaning and complaining of pain all over.  She was unable to specify where she was hurting.  She would only say if she was hurting all over and kept moaning.  The daughter was able to get 1 boost in her the entire day.  Today the patient refused to eat completely.  She just kept moaning.  The daughter also reports that the patient is slightly confused.  She is not answering appropriately. She is still ambulating but is much weaker.  No falls since last week. She just moans.  In the emergency department the patient was given 1 dose of IV fentanyl  which did nothing but make the patient nauseated.  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  Because the patient had a fall last week she also had a CT scan of her C-spine and head.   There is severe central canal stenosis from C5-6.  The patient is on Eliquis  but her head CT was unremarkable.    Review of Systems: unable to review all systems due to the inability of the patient to answer  questions. Past Medical History:  Diagnosis Date   A-fib [facility])    Acute on chronic diastolic CHF (congestive heart failure) (HCC) 03/27/2022   Anemia    Arthritis    Atrial fibrillation (HCC)    Atrial fibrillation with RVR (HCC) 03/20/2017   Cancer (HCC)    colon cancer   CAP (community acquired pneumonia) 01/25/2024   Chronic diastolic CHF (congestive heart failure) (HCC) 10/31/2020   DM (diabetes mellitus) (HCC)    HTN (hypertension)    Occasional tremors    on Inderal  for this   Sepsis secondary to UTI (HCC) 07/11/2020   Sleep apnea    not used cpap in a long time per daughter   Tremor    Past Surgical History:  Procedure Laterality Date   APPENDECTOMY     BIOPSY  12/23/2022   Procedure: BIOPSY;  Surgeon: Saintclair Jasper, MD;  Location: [facility];  Service: Gastroenterology;;   BIOPSY  08/03/2023   Procedure: BIOPSY;  Surgeon: Saintclair Jasper, MD;  Location: [facility];  Service: Gastroenterology;;   CARPAL TUNNEL RELEASE     CATARACT EXTRACTION Bilateral    COLONOSCOPY WITH PROPOFOL  N/A 08/03/2023   Procedure: COLONOSCOPY WITH PROPOFOL ;  Surgeon: Saintclair Jasper, MD;  Location: [facility];  Service: Gastroenterology;  Laterality: N/A;   ESOPHAGEAL DILATION  12/23/2022   Procedure: ESOPHAGEAL DILATION;  Surgeon: Saintclair Jasper, MD;  Location: [facility];  Service: Gastroenterology;;   ESOPHAGOGASTRODUODENOSCOPY (EGD) WITH PROPOFOL  N/A  12/23/2022   Procedure: ESOPHAGOGASTRODUODENOSCOPY (EGD) WITH PROPOFOL  W/ DILATION;  Surgeon: Saintclair Jasper, MD;  Location: [facility];  Service: Gastroenterology;  Laterality: N/A;  with dilation   HERNIA REPAIR     LAPAROSCOPIC RIGHT HEMI COLECTOMY Right 10/08/2023   Procedure: LAPAROSCOPIC RIGHT HEMICOLECTOMY;  Surgeon: Teresa Lonni HERO, MD;  Location: [facility];  Service: General;  Laterality: Right;   POLYPECTOMY  08/03/2023   Procedure: POLYPECTOMY;  Surgeon: Saintclair Jasper, MD;  Location: [facility];  Service: Gastroenterology;;   tarsal  tunnel surgery      THORACENTESIS Right 04/29/2024   Procedure: MILANA;  Surgeon: Meade Verdon RAMAN, MD;  Location: [facility];  Service: Pulmonary;  Laterality: Right;   Social History:  reports that she has never smoked. She has never been exposed to tobacco smoke. She has never used smokeless tobacco. She reports that she does not drink alcohol and does not use drugs.  No Known Allergies  Family History  Problem Relation Age of Onset   Diabetes Father    Asthma Brother    Cancer Daughter 36       BREAST CANCER    Prior to Admission medications   Medication Sig Start Date End Date Taking? Authorizing Provider  acetaminophen  (TYLENOL ) 500 MG tablet Take 1,000 mg by mouth every 6 (six) hours as needed for mild pain or headache.   Yes [provider]  allopurinol  (ZYLOPRIM ) 100 MG tablet TAKE 1 TABLET(100 MG) BY MOUTH DAILY Patient taking differently: Take 100 mg by mouth every evening. 08/09/21  Yes Janit Thresa HERO, DPM  Cholecalciferol  (VITAMIN D ) 125 MCG (5000 UT) CAPS Take 5,000 Units by mouth every evening.   Yes [provider]  diclofenac  Sodium (VOLTAREN ) 1 % GEL Apply 4 g topically 4 (four) times daily. Patient taking differently: Apply 4 g topically daily as needed (for pain). 11/29/23  Yes Emil Share, DO  ELIQUIS  5 MG TABS tablet TAKE 1 TABLET(5 MG) BY MOUTH TWICE DAILY 02/19/24  Yes Nishan, Peter C, MD  feeding supplement (ENSURE PLUS HIGH PROTEIN) LIQD Take 237 mLs by mouth 2 (two) times daily between meals. 05/03/24  Yes Kandis Perkins, DO  folic acid  (FOLVITE ) 800 MCG tablet Take 800 mcg by mouth daily.   Yes [provider]  furosemide  (LASIX ) 20 MG tablet Take 1 tablet (20 mg total) by mouth daily as needed for fluid or edema. Please take one tablet if you have leg swelling, shortness of breath, gain more than 3 pounds in 24 hours or 5 pounds in one week 05/03/24  Yes Bender, Perkins, DO  metoprolol  succinate (TOPROL -XL) 25 MG 24 hr tablet Take 0.5  tablets (12.5 mg total) by mouth daily. Take with or immediately following a meal. 05/03/24  Yes Bender, Perkins, DO  Multiple Vitamins-Minerals (ICAPS AREDS 2 PO) Take 1 capsule by mouth 2 (two) times daily.   Yes [provider]  ondansetron  (ZOFRAN -ODT) 4 MG disintegrating tablet Take 4 mg by mouth every 8 (eight) hours as needed for nausea or vomiting. 12/26/23  Yes [provider]  oxyCODONE  (OXY IR/ROXICODONE ) 5 MG immediate release tablet Take 5 mg by mouth 4 (four) times daily as needed. 06/22/24  Yes [provider]  pantoprazole  (PROTONIX ) 40 MG tablet Take 40 mg by mouth every morning. 03/27/24  Yes [provider]  Potassium Chloride  ER 20 MEQ TBCR Take 20 mEq by mouth daily. 04/09/22  Yes Clegg, Amy D, NP  ACCU-CHEK AVIVA PLUS test strip  10/06/20   [provider]  amoxicillin (AMOXIL) 500 MG tablet Take 500 mg by mouth 3 (three) times daily. Patient not taking: Reported on 08/09/2024 05/19/24   [provider]  empagliflozin  (JARDIANCE ) 10 MG TABS tablet TAKE 1 TABLET(10 MG) BY MOUTH DAILY 12/24/23   Nishan, Peter C, MD  gabapentin  (NEURONTIN ) 400 MG capsule Take 400 mg by mouth 3 (three) times daily. 03/25/22   [provider]  ipratropium (ATROVENT ) 0.06 % nasal spray Place 2 sprays into both nostrils 2 (two) times daily as needed (nasal drainage). 02/17/24 04/27/24  Karis Clunes, MD  Iron-FA-B Cmp-C-Biot-Probiotic (FUSION PLUS) CAPS Take 1 capsule by mouth every other day. 08/26/23   [provider]  loratadine  (CLARITIN ) 10 MG tablet Take 10 mg by mouth daily as needed for allergies. Patient not taking: Reported on 03/23/2024 08/28/23   [provider]  propranolol  (INDERAL ) 20 MG tablet Take 20 mg by mouth daily at 6 (six) AM. Patient not taking: Reported on 08/09/2024 06/20/24   [provider]    Physical Exam: Vitals:   08/09/24 1516 08/09/24 1800 08/09/24 1814 08/09/24 2000  BP: (!) 146/101 (!) 155/100  (!)  170/99  Pulse: 82 92  99  Resp: 18 16  16   Temp: 98.6 F (37 C)  98.1 F (36.7 C) 98.1 F (36.7 C)  TempSrc: Oral     SpO2: 98% 99%  98%   Physical Exam:  General: Elderly woman, sitting up, moaning and rocking. HEENT: Normocephalic, atraumatic, PERRL Cardiovascular: Normal rate and rhythm. Distal pulses intact. Pulmonary: Normal pulmonary effort, decreased bs left side Gastrointestinal: Nondistended abdomen, soft, mild tenderness in periumbilical area, normoactive bowel sounds Musculoskeletal:No lower ext edema Lymphadenopathy: No cervical LAD. Skin: Skin is warm and dry. Neuro: She is able to lie flat and and sit up in the bed without assistance PSYCH: She will make eye contact but does not attempt to communicate with me  Data Reviewed:  Results for orders placed or performed during the hospital encounter of 08/09/24 (from the past 24 hours)  Comprehensive metabolic panel     Status: Abnormal   Collection Time: 08/09/24  3:38 PM  Result Value Ref Range   Sodium 142 135 - 145 mmol/L   Potassium 4.0 3.5 - 5.1 mmol/L   Chloride 104 98 - 111 mmol/L   CO2 25 22 - 32 mmol/L   Glucose, Bld 146 (H) 70 - 99 mg/dL   BUN 22 8 - 23 mg/dL   Creatinine, Ser 8.98 (H) 0.44 - 1.00 mg/dL   Calcium 9.9 8.9 - 89.6 mg/dL   Total Protein 7.8 6.5 - 8.1 g/dL   Albumin 4.0 3.5 - 5.0 g/dL   AST 16 15 - 41 U/L   ALT <5 0 - 44 U/L   Alkaline Phosphatase 50 38 - 126 U/L   Total Bilirubin 0.6 0.0 - 1.2 mg/dL   GFR, Estimated 53 (L) >60 mL/min   Anion gap 13 5 - 15  CBC     Status: None   Collection Time: 08/09/24  3:38 PM  Result Value Ref Range   WBC 7.6 4.0 - 10.5 K/uL   RBC 4.13 3.87 - 5.11 MIL/uL   Hemoglobin 12.5 12.0 - 15.0 g/dL   HCT 59.3 63.9 - 53.9 %   MCV 98.3 80.0 - 100.0 fL   MCH 30.3 26.0 - 34.0 pg   MCHC 30.8 30.0 - 36.0 g/dL   RDW 84.7 88.4 - 84.4 %   Platelets 225 150 - 400 K/uL  nRBC 0.0 0.0 - 0.2 %  CBG monitoring, ED     Status: Abnormal   Collection Time: 08/09/24   3:52 PM  Result Value Ref Range   Glucose-Capillary 127 (H) 70 - 99 mg/dL  Urinalysis, Routine w reflex microscopic -Urine, Clean Catch     Status: Abnormal   Collection Time: 08/09/24  4:08 PM  Result Value Ref Range   Color, Urine YELLOW YELLOW   APPearance HAZY (A) CLEAR   Specific Gravity, Urine 1.019 1.005 - 1.030   pH 7.0 5.0 - 8.0   Glucose, UA NEGATIVE NEGATIVE mg/dL   Hgb urine dipstick NEGATIVE NEGATIVE   Bilirubin Urine NEGATIVE NEGATIVE   Ketones, ur NEGATIVE NEGATIVE mg/dL   Protein, ur 30 (A) NEGATIVE mg/dL   Nitrite NEGATIVE NEGATIVE   Leukocytes,Ua MODERATE (A) NEGATIVE   RBC / HPF 0-5 0 - 5 RBC/hpf   WBC, UA 0-5 0 - 5 WBC/hpf   Bacteria, UA RARE (A) NONE SEEN   Squamous Epithelial / HPF 0-5 0 - 5 /HPF   Mucus PRESENT    Hyaline Casts, UA PRESENT   Resp panel by RT-PCR (RSV, Flu A&B, Covid) Anterior Nasal Swab     Status: None   Collection Time: 08/09/24  5:54 PM   Specimen: Anterior Nasal Swab  Result Value Ref Range   SARS Coronavirus 2 by RT PCR NEGATIVE NEGATIVE   Influenza A by PCR NEGATIVE NEGATIVE   Influenza B by PCR NEGATIVE NEGATIVE   Resp Syncytial Virus by PCR NEGATIVE NEGATIVE     Assessment and Plan: Acute encephalopathy/ Pain - - The fentanyl  had no effect.  Will try Tylenol  now and will reassess once the patient's pain is controlled. - She does not have clear evidence of any infection.  Her white blood cell count is within normal limits. Her UA is within normal limits. Her chest x-ray reveals a very large left pleural effusion but the patient is not significantly hypoxic nor short of breath.  - She did receive a dose of Rocephin  and Zithromax  in the emergency department but I do not see reason to continue antibiotics at this time.  2. Severe central canal stenosis - Consider consultation  3.  Recurrent large left pleural effusion -PCCM performed thoracentesis 3 months ago.  4. Chronic afib  - Hold Eliquis  for possible thoracentesis. -  Continue beta Blocker    Advance Care Planning:   Code Status: Full Code the patient's daughter with whom she lives is her only child   Consults: None   Family Communication: None  Severity of Illness: The appropriate patient status for this patient is INPATIENT. Inpatient status is judged to be reasonable and necessary in order to provide the required intensity of service to ensure the patient's safety. The patient's presenting symptoms, physical exam findings, and initial radiographic and laboratory data in the context of their chronic comorbidities is felt to place them at high risk for further clinical deterioration. Furthermore, it is not anticipated that the patient will be medically stable for discharge from the hospital within 2 midnights of admission.   * I certify that at the point of admission it is my clinical judgment that the patient will require inpatient hospital care spanning beyond 2 midnights from the point of admission due to high intensity of service, high risk for further deterioration and high frequency of surveillance required.*  Author: ARTHEA CHILD, MD 08/09/2024 11:26 PM  For on call review [email or web address].

## 2024-08-09 NOTE — H&P (Incomplete)
 History and Physical    Patient: Anne Roach FMW:[id number] DOB: 1932-11-30 DOA: 08/09/2024 DOS: the patient was seen and examined on 08/09/2024 PCP: Ilah Crigler, MD  Patient coming from: Home  Chief Complaint:  Chief Complaint  Patient presents with  . Fall   HPI: Anne Roach is a 89 y.o. female with medical history significant for bilateral pleural effusions status post Thora centesis on large left pleural effusion 3 months ago.  She has a history of permanent atrial fibrillation on Eliquis , CKD stage III, type 2 diabetes mellitus, esophageal dysmotility and aspiration, and heart failure with EF of 40%.  The patient is status post colorectal carcinoma and partial colectomy 09/2023.  The patient's daughter with whom she lives provides the history.  She reports that the patient fell about a week ago but was doing okay until yesterday.  Yesterday she started moaning and complaining of pain all over.  She was unable to specify where she was hurting.  She would only say if she was hurting all over and kept moaning.  The daughter was able to get 1 boost in her the entire day.  Today the patient refused to eat completely.  She just kept moaning.  The daughter also reports that the patient is slightly confused.  She is not answering appropriately. She just moans.  In the emergency department the patient was given 1 dose of IV fentanyl  which did nothing but make the patient nauseated.  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  Because the patient had a fall last week she also had a CT scan of her C-spine and head.   There is severe central canal stenosis from C5-6.  The patient is on Eliquis  but her head CT was unremarkable.    Review of Systems: unable to review all systems due to the inability of the patient to answer questions. Past Medical History:  Diagnosis Date  . A-fib (HCC)   . Acute on  chronic diastolic CHF (congestive heart failure) (HCC) 03/27/2022  . Anemia   . Arthritis   . Atrial fibrillation (HCC)   . Atrial fibrillation with RVR (HCC) 03/20/2017  . Cancer [facility])    colon cancer  . CAP (community acquired pneumonia) 01/25/2024  . Chronic diastolic CHF (congestive heart failure) (HCC) 10/31/2020  . DM (diabetes mellitus) (HCC)   . HTN (hypertension)   . Occasional tremors    on Inderal  for this  . Sepsis secondary to UTI (HCC) 07/11/2020  . Sleep apnea    not used cpap in a long time per daughter  . Tremor    Past Surgical History:  Procedure Laterality Date  . APPENDECTOMY    . BIOPSY  12/23/2022   Procedure: BIOPSY;  Surgeon: Saintclair Jasper, MD;  Location: [facility];  Service: Gastroenterology;;  . BIOPSY  08/03/2023   Procedure: BIOPSY;  Surgeon: Saintclair Jasper, MD;  Location: [facility];  Service: Gastroenterology;;  . CARPAL TUNNEL RELEASE    . CATARACT EXTRACTION Bilateral   . COLONOSCOPY WITH PROPOFOL  N/A 08/03/2023   Procedure: COLONOSCOPY WITH PROPOFOL ;  Surgeon: Saintclair Jasper, MD;  Location: [facility];  Service: Gastroenterology;  Laterality: N/A;  . ESOPHAGEAL DILATION  12/23/2022   Procedure: ESOPHAGEAL DILATION;  Surgeon: Saintclair Jasper, MD;  Location: [facility];  Service: Gastroenterology;;  . ESOPHAGOGASTRODUODENOSCOPY (EGD) WITH PROPOFOL  N/A 12/23/2022   Procedure: ESOPHAGOGASTRODUODENOSCOPY (EGD) WITH PROPOFOL  W/ DILATION;  Surgeon: Saintclair,  Estelita, MD;  Location: [facility];  Service: Gastroenterology;  Laterality: N/A;  with dilation  . HERNIA REPAIR    . LAPAROSCOPIC RIGHT HEMI COLECTOMY Right 10/08/2023   Procedure: LAPAROSCOPIC RIGHT HEMICOLECTOMY;  Surgeon: Teresa Lonni HERO, MD;  Location: [facility];  Service: General;  Laterality: Right;  . POLYPECTOMY  08/03/2023   Procedure: POLYPECTOMY;  Surgeon: Saintclair Estelita, MD;  Location: [facility];  Service: Gastroenterology;;  . tarsal tunnel surgery     . THORACENTESIS Right 04/29/2024    Procedure: THORACENTESIS;  Surgeon: Meade Verdon RAMAN, MD;  Location: [facility];  Service: Pulmonary;  Laterality: Right;   Social History:  reports that she has never smoked. She has never been exposed to tobacco smoke. She has never used smokeless tobacco. She reports that she does not drink alcohol and does not use drugs.  No Known Allergies  Family History  Problem Relation Age of Onset  . Diabetes Father   . Asthma Brother   . Cancer Daughter 61       BREAST CANCER    Prior to Admission medications   Medication Sig Start Date End Date Taking? Authorizing Provider  acetaminophen  (TYLENOL ) 500 MG tablet Take 1,000 mg by mouth every 6 (six) hours as needed for mild pain or headache.   Yes [provider]  allopurinol  (ZYLOPRIM ) 100 MG tablet TAKE 1 TABLET(100 MG) BY MOUTH DAILY Patient taking differently: Take 100 mg by mouth every evening. 08/09/21  Yes Janit Thresa HERO, DPM  Cholecalciferol  (VITAMIN D ) 125 MCG (5000 UT) CAPS Take 5,000 Units by mouth every evening.   Yes [provider]  diclofenac  Sodium (VOLTAREN ) 1 % GEL Apply 4 g topically 4 (four) times daily. Patient taking differently: Apply 4 g topically daily as needed (for pain). 11/29/23  Yes Emil Share, DO  ELIQUIS  5 MG TABS tablet TAKE 1 TABLET(5 MG) BY MOUTH TWICE DAILY 02/19/24  Yes Nishan, Peter C, MD  feeding supplement (ENSURE PLUS HIGH PROTEIN) LIQD Take 237 mLs by mouth 2 (two) times daily between meals. 05/03/24  Yes Bender, Damien, DO  folic acid  (FOLVITE ) 800 MCG tablet Take 800 mcg by mouth daily.   Yes [provider]  furosemide  (LASIX ) 20 MG tablet Take 1 tablet (20 mg total) by mouth daily as needed for fluid or edema. Please take one tablet if you have leg swelling, shortness of breath, gain more than 3 pounds in 24 hours or 5 pounds in one week 05/03/24  Yes Bender, Damien, DO  metoprolol  succinate (TOPROL -XL) 25 MG 24 hr tablet Take 0.5 tablets (12.5 mg total) by mouth daily. Take with or  immediately following a meal. 05/03/24  Yes Bender, Damien, DO  Multiple Vitamins-Minerals (ICAPS AREDS 2 PO) Take 1 capsule by mouth 2 (two) times daily.   Yes [provider]  ondansetron  (ZOFRAN -ODT) 4 MG disintegrating tablet Take 4 mg by mouth every 8 (eight) hours as needed for nausea or vomiting. 12/26/23  Yes [provider]  oxyCODONE  (OXY IR/ROXICODONE ) 5 MG immediate release tablet Take 5 mg by mouth 4 (four) times daily as needed. 06/22/24  Yes [provider]  pantoprazole  (PROTONIX ) 40 MG tablet Take 40 mg by mouth every morning. 03/27/24  Yes [provider]  Potassium Chloride  ER 20 MEQ TBCR Take 20 mEq by mouth daily. 04/09/22  Yes Clegg, Amy D, NP  ACCU-CHEK AVIVA PLUS test strip  10/06/20   [provider]  amoxicillin (AMOXIL) 500 MG tablet Take 500 mg by mouth  3 (three) times daily. Patient not taking: Reported on 08/09/2024 05/19/24   [provider]  empagliflozin  (JARDIANCE ) 10 MG TABS tablet TAKE 1 TABLET(10 MG) BY MOUTH DAILY 12/24/23   Nishan, Peter C, MD  gabapentin  (NEURONTIN ) 400 MG capsule Take 400 mg by mouth 3 (three) times daily. 03/25/22   [provider]  ipratropium (ATROVENT ) 0.06 % nasal spray Place 2 sprays into both nostrils 2 (two) times daily as needed (nasal drainage). 02/17/24 04/27/24  Karis Clunes, MD  Iron-FA-B Cmp-C-Biot-Probiotic (FUSION PLUS) CAPS Take 1 capsule by mouth every other day. 08/26/23   [provider]  loratadine  (CLARITIN ) 10 MG tablet Take 10 mg by mouth daily as needed for allergies. Patient not taking: Reported on 03/23/2024 08/28/23   [provider]  propranolol  (INDERAL ) 20 MG tablet Take 20 mg by mouth daily at 6 (six) AM. Patient not taking: Reported on 08/09/2024 06/20/24   [provider]    Physical Exam: Vitals:   08/09/24 1516 08/09/24 1800 08/09/24 1814 08/09/24 2000  BP: (!) 146/101 (!) 155/100  (!) 170/99  Pulse: 82 92  99  Resp: 18 16  16   Temp: 98.6  F (37 C)  98.1 F (36.7 C) 98.1 F (36.7 C)  TempSrc: Oral     SpO2: 98% 99%  98%   Physical Exam:  General: Elderly woman, sitting up, moaning and rocking. HEENT: Normocephalic, atraumatic, PERRL Cardiovascular: Normal rate and rhythm. Distal pulses intact. Pulmonary: Normal pulmonary effort, decreased bs left side Gastrointestinal: Nondistended abdomen, soft, mild tenderness in periumbilical area, normoactive bowel sounds Musculoskeletal:No lower ext edema Lymphadenopathy: No cervical LAD. Skin: Skin is warm and dry. Neuro: She is able to lie flat and and sit up in the bed without assistance PSYCH: She will make eye contact but does not attempt to communicate with me  Data Reviewed: {Tip this will not be part of the note when signed- Document your independent interpretation of telemetry tracing, EKG, lab, Radiology test or any other diagnostic tests. Add any new diagnostic test ordered today. (Optional):[id number]} Results for orders placed or performed during the hospital encounter of 08/09/24 (from the past 24 hours)  Comprehensive metabolic panel     Status: Abnormal   Collection Time: 08/09/24  3:38 PM  Result Value Ref Range   Sodium 142 135 - 145 mmol/L   Potassium 4.0 3.5 - 5.1 mmol/L   Chloride 104 98 - 111 mmol/L   CO2 25 22 - 32 mmol/L   Glucose, Bld 146 (H) 70 - 99 mg/dL   BUN 22 8 - 23 mg/dL   Creatinine, Ser 8.98 (H) 0.44 - 1.00 mg/dL   Calcium 9.9 8.9 - 89.6 mg/dL   Total Protein 7.8 6.5 - 8.1 g/dL   Albumin 4.0 3.5 - 5.0 g/dL   AST 16 15 - 41 U/L   ALT <5 0 - 44 U/L   Alkaline Phosphatase 50 38 - 126 U/L   Total Bilirubin 0.6 0.0 - 1.2 mg/dL   GFR, Estimated 53 (L) >60 mL/min   Anion gap 13 5 - 15  CBC     Status: None   Collection Time: 08/09/24  3:38 PM  Result Value Ref Range   WBC 7.6 4.0 - 10.5 K/uL   RBC 4.13 3.87 - 5.11 MIL/uL   Hemoglobin 12.5 12.0 - 15.0 g/dL   HCT 59.3 63.9 - 53.9 %   MCV 98.3 80.0 - 100.0 fL   MCH 30.3 26.0 - 34.0 pg   MCHC  30.8 30.0 - 36.0 g/dL   RDW 84.7 88.4 - 84.4 %   Platelets 225 150 - 400 K/uL   nRBC 0.0 0.0 - 0.2 %  CBG monitoring, ED     Status: Abnormal   Collection Time: 08/09/24  3:52 PM  Result Value Ref Range   Glucose-Capillary 127 (H) 70 - 99 mg/dL  Urinalysis, Routine w reflex microscopic -Urine, Clean Catch     Status: Abnormal   Collection Time: 08/09/24  4:08 PM  Result Value Ref Range   Color, Urine YELLOW YELLOW   APPearance HAZY (A) CLEAR   Specific Gravity, Urine 1.019 1.005 - 1.030   pH 7.0 5.0 - 8.0   Glucose, UA NEGATIVE NEGATIVE mg/dL   Hgb urine dipstick NEGATIVE NEGATIVE   Bilirubin Urine NEGATIVE NEGATIVE   Ketones, ur NEGATIVE NEGATIVE mg/dL   Protein, ur 30 (A) NEGATIVE mg/dL   Nitrite NEGATIVE NEGATIVE   Leukocytes,Ua MODERATE (A) NEGATIVE   RBC / HPF 0-5 0 - 5 RBC/hpf   WBC, UA 0-5 0 - 5 WBC/hpf   Bacteria, UA RARE (A) NONE SEEN   Squamous Epithelial / HPF 0-5 0 - 5 /HPF   Mucus PRESENT    Hyaline Casts, UA PRESENT   Resp panel by RT-PCR (RSV, Flu A&B, Covid) Anterior Nasal Swab     Status: None   Collection Time: 08/09/24  5:54 PM   Specimen: Anterior Nasal Swab  Result Value Ref Range   SARS Coronavirus 2 by RT PCR NEGATIVE NEGATIVE   Influenza A by PCR NEGATIVE NEGATIVE   Influenza B by PCR NEGATIVE NEGATIVE   Resp Syncytial Virus by PCR NEGATIVE NEGATIVE     Assessment and Plan: Acute encephalopathy/ Pain  - The fentanyl  had no effect.  Will try Tylenol  now and will reassess once the patient's pain is controlled. - She does not have clear evidence of any infection.  Her white blood cell count is within normal limits. Her UA is within normal limits. Her chest x-ray reveals a very large left pleural effusion but the patient is not significantly hypoxic nor short of breath.  - She did receive a dose of Rocephin  and Zithromax  in the emergency department but I do not see reason to continue that at this time.   2. Severe central canal stenosis - PT/OT    3.  Recurrent large left pleural effusion -PCCM perform thoracentesis 3 months ago.    Advance Care Planning:   Code Status: Full Code   Consults: None   Family Communication: None  Severity of Illness: The appropriate patient status for this patient is INPATIENT. Inpatient status is judged to be reasonable and necessary in order to provide the required intensity of service to ensure the patient's safety. The patient's presenting symptoms, physical exam findings, and initial radiographic and laboratory data in the context of their chronic comorbidities is felt to place them at high risk for further clinical deterioration. Furthermore, it is not anticipated that the patient will be medically stable for discharge from the hospital within 2 midnights of admission.   * I certify that at the point of admission it is my clinical judgment that the patient will require inpatient hospital care spanning beyond 2 midnights from the point of admission due to high intensity of service, high risk for further deterioration and high frequency of surveillance required.*  Author: ARTHEA CHILD, MD 08/09/2024 11:26 PM  For on call review [email or web address].

## 2024-08-10 ENCOUNTER — Inpatient Hospital Stay (HOSPITAL_COMMUNITY)
Admit: 2024-08-10 | Discharge: 2024-08-10 | Disposition: A | Discharge status: Home / Self Care | Attending: Family Medicine | Admitting: Family Medicine

## 2024-08-10 DIAGNOSIS — G9341 Metabolic encephalopathy: Secondary | ICD-10-CM | POA: Diagnosis not present

## 2024-08-10 DIAGNOSIS — I482 Chronic atrial fibrillation, unspecified: Secondary | ICD-10-CM | POA: Diagnosis not present

## 2024-08-10 DIAGNOSIS — R4182 Altered mental status, unspecified: Secondary | ICD-10-CM

## 2024-08-10 DIAGNOSIS — R569 Unspecified convulsions: Secondary | ICD-10-CM | POA: Diagnosis not present

## 2024-08-10 DIAGNOSIS — G9340 Encephalopathy, unspecified: Secondary | ICD-10-CM | POA: Diagnosis not present

## 2024-08-10 LAB — CBC
HCT: 40.1 % (ref 36.0–46.0)
Hemoglobin: 12.2 g/dL (ref 12.0–15.0)
MCH: 30.1 pg (ref 26.0–34.0)
MCHC: 30.4 g/dL (ref 30.0–36.0)
MCV: 99 fL (ref 80.0–100.0)
Platelets: 210 K/uL (ref 150–400)
RBC: 4.05 MIL/uL (ref 3.87–5.11)
RDW: 15 % (ref 11.5–15.5)
WBC: 7.6 K/uL (ref 4.0–10.5)
nRBC: 0 % (ref 0.0–0.2)

## 2024-08-10 LAB — PROCALCITONIN: Procalcitonin: <0.1 ng/mL

## 2024-08-10 LAB — BASIC METABOLIC PANEL WITH GFR
Anion gap: 14 (ref 5–15)
BUN: 20 mg/dL (ref 8–23)
CO2: 24 mmol/L (ref 22–32)
Calcium: 9.6 mg/dL (ref 8.9–10.3)
Chloride: 104 mmol/L (ref 98–111)
Creatinine, Ser: 0.93 mg/dL (ref 0.44–1.00)
GFR, Estimated: 58 mL/min — ABNORMAL LOW (ref >60)
Glucose, Bld: 144 mg/dL — ABNORMAL HIGH (ref 70–99)
Potassium: 4 mmol/L (ref 3.5–5.1)
Sodium: 142 mmol/L (ref 135–145)

## 2024-08-10 LAB — TSH: TSH: 1.93 u[IU]/mL (ref 0.350–4.500)

## 2024-08-10 LAB — TROPONIN T, HIGH SENSITIVITY: Troponin T High Sensitivity: 17 ng/L (ref 0–19)

## 2024-08-10 LAB — AMMONIA: Ammonia: 16 umol/L (ref 9–35)

## 2024-08-10 MED ORDER — ALLOPURINOL 100 MG PO TABS
100.0000 mg | ORAL_TABLET | Freq: Every evening | ORAL | Status: DC
  Administered 2024-08-10 – 2024-08-12 (×3): 100 mg via ORAL
  Filled 2024-08-10 (×3): qty 1

## 2024-08-10 MED ORDER — ONDANSETRON HCL 4 MG/2ML IJ SOLN
4.0000 mg | Freq: Four times a day (QID) | INTRAMUSCULAR | Status: DC | PRN
  Administered 2024-08-10: 4 mg via INTRAVENOUS

## 2024-08-10 MED ORDER — OXYCODONE HCL 5 MG PO TABS
5.0000 mg | ORAL_TABLET | ORAL | Status: DC | PRN
  Administered 2024-08-10 – 2024-08-12 (×2): 5 mg via ORAL
  Filled 2024-08-10 (×2): qty 1

## 2024-08-10 MED ORDER — DICLOFENAC SODIUM 1 % EX GEL: 4.0000 g | Freq: Every day | CUTANEOUS | Status: DC | PRN

## 2024-08-10 MED ORDER — APIXABAN 5 MG PO TABS
5.0000 mg | ORAL_TABLET | Freq: Two times a day (BID) | ORAL | Status: DC
Start: 2024-08-10 — End: 2024-08-13
  Administered 2024-08-10 – 2024-08-12 (×5): 5 mg via ORAL
  Filled 2024-08-10 (×5): qty 1

## 2024-08-10 MED ORDER — PANTOPRAZOLE SODIUM 40 MG PO TBEC
40.0000 mg | DELAYED_RELEASE_TABLET | Freq: Every morning | ORAL | Status: DC
  Administered 2024-08-10 – 2024-08-12 (×3): 40 mg via ORAL
  Filled 2024-08-10 (×3): qty 1

## 2024-08-10 MED ORDER — METOPROLOL SUCCINATE ER 25 MG PO TB24
12.5000 mg | ORAL_TABLET | Freq: Every day | ORAL | Status: DC
  Administered 2024-08-10 – 2024-08-12 (×3): 12.5 mg via ORAL
  Filled 2024-08-10 (×3): qty 1

## 2024-08-10 NOTE — Plan of Care (Signed)

## 2024-08-10 NOTE — Progress Notes (Signed)
 Routine EEG complete. Results pending.

## 2024-08-10 NOTE — Progress Notes (Signed)
  Progress Note   Patient: Anne Roach FMW:[id number] DOB: Aug 18, 1933   Vitals:   08/09/24 1814 08/09/24 2000 08/09/24 2348 08/10/24 0023  BP:  (!) 170/99 (!) 149/104 (!) 154/116  Pulse:  99 92 90  Resp:  16 18 18   Temp: 98.1 F (36.7 C) 98.1 F (36.7 C) 97.7 F (36.5 C) 98.3 F (36.8 C)  TempSrc:   Oral Oral  SpO2:  98% 100% 97%   Came to reevaluate. Patient asleep after IV Tylenol    Author: ARTHEA CHILD, MD 08/10/2024 1:46 AM  For on call review [email or web address].

## 2024-08-10 NOTE — Hospital Course (Signed)
 Anne Roach is a 89 y.o. female with a history of recurrent pleural effusion, atrial fibrillation, gout.  Patient presented secondary to altered mental status and history of falls. Unclear etiology.

## 2024-08-10 NOTE — Procedures (Signed)
 Patient Name: ADALINE TREJOS  MRN: [id number]  Epilepsy Attending: Arlin MALVA Krebs  Referring Physician/Provider: Briana Elgin LABOR, MD  Date: 08/10/2024 Duration: 25.23 mins  Patient history: 89yo F with ams. EEG to evaluate for seizure  Level of alertness: Awake/ lethargic, asleep  AEDs during EEG study: None  Technical aspects: This EEG study was done with scalp electrodes positioned according to the 10-20 International system of electrode placement. Electrical activity was reviewed with band pass filter of 1-70Hz , sensitivity of 7 uV/mm, display speed of 104mm/sec with a 60Hz  notched filter applied as appropriate. EEG data were recorded continuously and digitally stored.  Video monitoring was available and reviewed as appropriate.  Description: EEG showed continuous generalized predominantly 5 to 6 Hz theta slowing admixed with intermittent 2-3hz  delta slowing.Sleep was characterized by sleep spindles (12 to 14 Hz), maximal frontocentral region. Hyperventilation and photic stimulation were not performed.     ABNORMALITY - Continuous slow, generalized  IMPRESSION: This study is suggestive of moderate diffuse encephalopathy. No seizures or epileptiform discharges were seen throughout the recording.  Avani Sensabaugh O Eliza Grissinger

## 2024-08-10 NOTE — Progress Notes (Addendum)
 PROGRESS NOTE    Anne Roach  FMW:[id number] DOB: [birth year] DOA: 08/09/2024 PCP: Ilah Crigler, MD   Brief Narrative: Anne Roach is a 89 y.o. female with a history of recurrent pleural effusion, atrial fibrillation, gout.  Patient presented secondary to altered mental status and history of falls. Unclear etiology.   Assessment and Plan:  Acute metabolic encephalopathy Unclear etiology. Initial CT head without etiology. Per daughter, patient will have unresponsiveness episodes, indicated by staring and not answering questions. TSH normal. History of low-normal vitamin B12. Ceftriaxone  and azithromycin  started but unlikely pneumonia. -Check EEG -Check vitamin B12, folate, ammonia -Check procalcitonin  Severe central canal stenosis at C5-6 Possibly contributing to patient's falls. Unlikely surgical candidate and after discussion with daughter, unlikely to pursue management. -Will discuss with neurosurgery, but anticipate conservative management  Recurrent left pleural effusion Patient has a history of multiple thoracenteses. Per last admission, consideration for possible catheter for recurrent effusion. Currently asymptomatic and on room air. -Will discuss with pulmonology if consideration for Pleurx catheter vs continued thoracenteses as needed.  Chronic atrial fibrillation Noted. Eliquis  held on admission. -Continue Eliquis  and Toprol  XL  History of Gout -Continue allopurinol   Right knee pain Secondary to prior fall. Patient seen and evaluated for this at an outside ED/hospital.  Chronic HFrEF Last LVEF of 40-45% from June 2025. Currently appears euvolemic except proBNP elevated at 4,884. Patient was previously on Lasix  which was discontinued.   DVT prophylaxis: Eliquis  Code Status:   Code Status: Full Code Family Communication: Daughter at bedside Disposition Plan: Discharge likely back home in 1-2 days pending specialist recommendations, improvement of mental  status, PT/OT recommendations   Consultants:  None  Procedures:  None  Antimicrobials: None    Subjective:  Interpreter: Orvel #[id number]  Patient not responding to questions verbally.  Objective: BP 124/81 (BP Location: Right Arm)   Pulse (!) 101   Temp 98.5 F (36.9 C) (Oral)   Resp 18   Ht 5' 5 (1.651 m)   Wt 60.9 kg   SpO2 95%   BMI 22.34 kg/m   Examination:  General exam: Appears calm and comfortable Respiratory system: Clear to auscultation. Respiratory effort normal. Cardiovascular system: S1 & S2 heard, RRR. No murmurs, rubs, gallops or clicks. Gastrointestinal system: Abdomen is nondistended, soft and nontender. Normal bowel sounds heard. Central nervous system: Alert. Follows simple commands. Musculoskeletal: No edema. No calf tenderness   Data Reviewed: I have personally reviewed following labs and imaging studies  CBC Lab Results  Component Value Date   WBC 7.6 08/10/2024   RBC 4.05 08/10/2024   HGB 12.2 08/10/2024   HCT 40.1 08/10/2024   MCV 99.0 08/10/2024   MCH 30.1 08/10/2024   PLT 210 08/10/2024   MCHC 30.4 08/10/2024   RDW 15.0 08/10/2024   LYMPHSABS 1.6 04/27/2024   MONOABS 0.5 04/27/2024   EOSABS 0.1 04/27/2024   BASOSABS 0.0 04/27/2024     Last metabolic panel Lab Results  Component Value Date   NA 142 08/10/2024   K 4.0 08/10/2024   CL 104 08/10/2024   CO2 24 08/10/2024   BUN 20 08/10/2024   CREATININE 0.93 08/10/2024   GLUCOSE 144 (H) 08/10/2024   GFRNONAA 58 (L) 08/10/2024   GFRAA 58 (L) 07/14/2020   CALCIUM 9.6 08/10/2024   PHOS 3.4 01/26/2024   PROT 7.8 08/09/2024   ALBUMIN 4.0 08/09/2024   BILITOT 0.6 08/09/2024   ALKPHOS 50 08/09/2024   AST 16 08/09/2024   ALT <5 08/09/2024  ANIONGAP 14 08/10/2024    GFR: Estimated Creatinine Clearance: 36.9 mL/min (by C-G formula based on SCr of 0.93 mg/dL).  Recent Results (from the past 240 hours)  Resp panel by RT-PCR (RSV, Flu A&B, Covid) Anterior Nasal Swab      Status: None   Collection Time: 08/09/24  5:54 PM   Specimen: Anterior Nasal Swab  Result Value Ref Range Status   SARS Coronavirus 2 by RT PCR NEGATIVE NEGATIVE Final    Comment: (NOTE) SARS-CoV-2 target nucleic acids are NOT DETECTED.  The SARS-CoV-2 RNA is generally detectable in upper respiratory specimens during the acute phase of infection. The lowest concentration of SARS-CoV-2 viral copies this assay can detect is 138 copies/mL. A negative result does not preclude SARS-Cov-2 infection and should not be used as the sole basis for treatment or other patient management decisions. A negative result may occur with  improper specimen collection/handling, submission of specimen other than nasopharyngeal swab, presence of viral mutation(s) within the areas targeted by this assay, and inadequate number of viral copies(<138 copies/mL). A negative result must be combined with clinical observations, patient history, and epidemiological information. The expected result is Negative.  Fact Sheet for Patients:  BloggerCourse.com  Fact Sheet for Healthcare Providers:  SeriousBroker.it  This test is no t yet approved or cleared by the [place]  FDA and  has been authorized for detection and/or diagnosis of SARS-CoV-2 by FDA under an Emergency Use Authorization (EUA). This EUA will remain  in effect (meaning this test can be used) for the duration of the COVID-19 declaration under Section 564(b)(1) of the Act, 21 U.S.C.section 360bbb-3(b)(1), unless the authorization is terminated  or revoked sooner.       Influenza A by PCR NEGATIVE NEGATIVE Final   Influenza B by PCR NEGATIVE NEGATIVE Final    Comment: (NOTE) The Xpert Xpress SARS-CoV-2/FLU/RSV plus assay is intended as an aid in the diagnosis of influenza from Nasopharyngeal swab specimens and should not be used as a sole basis for treatment. Nasal washings and aspirates are  unacceptable for Xpert Xpress SARS-CoV-2/FLU/RSV testing.  Fact Sheet for Patients: BloggerCourse.com  Fact Sheet for Healthcare Providers: SeriousBroker.it  This test is not yet approved or cleared by the [place]  FDA and has been authorized for detection and/or diagnosis of SARS-CoV-2 by FDA under an Emergency Use Authorization (EUA). This EUA will remain in effect (meaning this test can be used) for the duration of the COVID-19 declaration under Section 564(b)(1) of the Act, 21 U.S.C. section 360bbb-3(b)(1), unless the authorization is terminated or revoked.     Resp Syncytial Virus by PCR NEGATIVE NEGATIVE Final    Comment: (NOTE) Fact Sheet for Patients: BloggerCourse.com  Fact Sheet for Healthcare Providers: SeriousBroker.it  This test is not yet approved or cleared by the [place]  FDA and has been authorized for detection and/or diagnosis of SARS-CoV-2 by FDA under an Emergency Use Authorization (EUA). This EUA will remain in effect (meaning this test can be used) for the duration of the COVID-19 declaration under Section 564(b)(1) of the Act, 21 U.S.C. section 360bbb-3(b)(1), unless the authorization is terminated or revoked.  Performed at [facility], [address] [place], [place] [address]       Radiology Studies: CT Head Wo Contrast Result Date: 08/09/2024 EXAM: CT HEAD WITHOUT CONTRAST 08/09/2024 08:06:31 PM TECHNIQUE: CT of the head was performed without the administration of intravenous contrast. Automated exposure control, iterative reconstruction, and/or weight based adjustment of the mA/kV was utilized to  reduce the radiation dose to as low as reasonably achievable. COMPARISON: None available. CLINICAL HISTORY: Head trauma, minor (Age >= 65y). Table formatting from the original note was not included.; Notes from triage:; Pt has  had more falls within this month and seems more confused. Pt complains of pain all over. Family reports that her urine is dark and she is not eating well. FINDINGS: BRAIN AND VENTRICLES: Parenchymal volume loss is commensurate with the patient's age. Periventricular white matter changes are present likely reflecting the sequela of small vessel ischemia. No acute hemorrhage. No evidence of acute infarct. No hydrocephalus. No extra-axial collection. No mass effect or midline shift. ORBITS: No acute abnormality. SINUSES: No acute abnormality. SOFT TISSUES AND SKULL: No acute soft tissue abnormality. No skull fracture. IMPRESSION: 1. No acute intracranial abnormality. 2. Parenchymal volume loss commensurate with the patient's age. 3. Periventricular white matter changes likely reflecting the sequela of small vessel ischemia. Electronically signed by: Dorethia Molt MD 08/09/2024 08:20 PM EDT RP Workstation: [id number]   CT Cervical Spine Wo Contrast Result Date: 08/09/2024 EXAM: CT CERVICAL SPINE WITHOUT CONTRAST 08/09/2024 08:06:31 PM TECHNIQUE: CT of the cervical spine was performed without the administration of intravenous contrast. Multiplanar reformatted images are provided for review. Automated exposure control, iterative reconstruction, and/or weight-based adjustment of the mA/kV was utilized to reduce the radiation dose to as low as reasonably achievable. COMPARISON: None available. CLINICAL HISTORY: Neck trauma (Age >= 65y). Patient has had more falls within this month and seems more confused. Patient complains of pain all over. Family reports that her urine is dark and she is not eating well. FINDINGS: CERVICAL SPINE: BONES AND ALIGNMENT: No acute fracture or listhesis. DEGENERATIVE CHANGES: Disc space narrowing and endplate remodeling throughout the cervical spine, most severe at C4-C6 in keeping with changes of advanced degenerative disc disease. 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 Multilevel uncovertebral and facet arthrosis is present with moderate to severe bilateral neural foraminal narrowing at C5-6. SOFT TISSUES: No prevertebral soft tissue swelling. A left pleural effusion is partially visualized within the left apex. Note: Right carotid bifurcation calcification is noted. IMPRESSION: 1. No acute fracture or listhesis. 2. Severe central canal stenosis at C5-6 due to bulky ossification of the posterior longitudinal ligament, with flattening of the thecal sac and AP diameter of the spinal canal of 3 mm. 3. Advanced degenerative disc disease, most severe at C4-C6. 4. Multilevel uncovertebral and facet arthrosis with moderate to severe bilateral neural foraminal narrowing at C5-6. Electronically signed by: Dorethia Molt MD 08/09/2024 08:17 PM EDT RP Workstation: [id number]   CT ABDOMEN PELVIS W CONTRAST Result Date: 08/09/2024 CLINICAL DATA:  Acute nonlocalized abdominal pain. Increasing falls this month and confusion. Pain all over. Dark urine. EXAM: CT ABDOMEN AND PELVIS WITH CONTRAST TECHNIQUE: Multidetector CT imaging of the abdomen and pelvis was performed using the standard protocol following bolus administration of intravenous contrast. RADIATION DOSE REDUCTION: This exam was performed according to the departmental dose-optimization program which includes automated exposure control, adjustment of the mA and/or kV according to patient size and/or use of iterative reconstruction technique. CONTRAST:  OMNIPAQUE  IOHEXOL  300 MG/ML  SOLN COMPARISON:  02/10/2024 FINDINGS: Lower chest: Large left pleural effusion with left lower lobe collapse. Cardiac enlargement with small pericardial effusion. Appearances are similar to the previous study. Hepatobiliary: No focal liver abnormality is seen. No gallstones,  gallbladder wall thickening, or biliary dilatation.  Pancreas: Diffuse pancreatic atrophy. Calcifications in the head of the pancreas consistent with chronic pancreatitis. Poorly defined cystic structure in the head of the pancreas measuring 1.2 cm diameter. No change since prior study. No acute infiltrative changes. Spleen: Normal in size without focal abnormality. Adrenals/Urinary Tract: No adrenal gland nodules. Renal nephrograms are symmetrical. Right renal cysts, largest measuring 2.1 cm diameter. No interval change. No imaging follow-up indicated. No hydronephrosis or hydroureter. Bladder is normal. Stomach/Bowel: Stomach, small bowel, and colon are not abnormally distended. Previous right hemicolectomy with ileocolonic anastomosis. No wall thickening or inflammatory stranding identified. Vascular/Lymphatic: Aortic atherosclerosis. No enlarged abdominal or pelvic lymph nodes. Reproductive: Uterus and bilateral adnexa are unremarkable. Other: No abdominal wall hernia or abnormality. No abdominopelvic ascites. Musculoskeletal: Degenerative changes in the spine. No acute bony abnormalities. IMPRESSION: 1. Large left pleural effusion with collapse or consolidation of the left lower lung, similar to prior study. 2. Cardiac enlargement with small pericardial effusion. 3. No evidence of bowel obstruction or inflammation. 4. Cystic lesion in the head of the pancreas is unchanged since prior study. Due to patient's age, no imaging follow-up is indicated. Pancreatic calcifications likely representing chronic pancreatitis. 5. Aortic atherosclerosis. Electronically Signed   By: Elsie Gravely M.D.   On: 08/09/2024 20:14   DG Pelvis Portable Result Date: 08/09/2024 CLINICAL DATA:  Fall EXAM: PORTABLE PELVIS 1-2 VIEWS COMPARISON:  None Available. FINDINGS: There is no evidence of pelvic fracture or diastasis. No pelvic bone lesions are seen. IMPRESSION: Negative. Electronically Signed   By: Greig Pique M.D.   On: 08/09/2024 18:59   DG Chest Portable 1 View Result  Date: 08/09/2024 CLINICAL DATA:  Fall EXAM: PORTABLE CHEST 1 VIEW COMPARISON:  Chest x-ray 04/29/2024 FINDINGS: There is a moderate left pleural effusion which has significantly increased from prior. Right lung is clear. There is no pneumothorax. Can not exclude underlying atelectasis/airspace disease in the left lung base. The heart is enlarged, unchanged. No acute osseous abnormality. IMPRESSION: Moderate left pleural effusion which has significantly increased from prior. Can not exclude underlying atelectasis/airspace disease in the left lung base. Electronically Signed   By: Greig Pique M.D.   On: 08/09/2024 18:59      LOS: 1 day    Elgin Lam, MD [facility] 08/10/2024, 1:15 PM   If 7PM-7AM, please contact night-coverage [email or web address]

## 2024-08-10 NOTE — TOC Initial Note (Signed)
 Transition of Care [facility]) - Initial/Assessment Note    Patient Details  Name: Anne Roach MRN: [id number] Date of Birth: [birth year]  [facility] CM/SW Contact:    Doneta Glenys DASEN, RN Phone Number: 08/10/2024, 11:27 AM  Clinical Narrative:                 Presented for multiple falls. PTA lives in a single family home with daughter Ica Daye [phone] and a nephew. Patient currently active with [facility] for HHA (2.5 hours daily) and [facility] for oxygen (3L/[place] continuous). DME- cane rollator, shower chair, bedside commode & wheelchair. Patient has PCP & insurance. Patient's daughter will transport home at discharge via private vehicle.  CM verified HHA with [facility] and [facility]. CM spoke with [name] about a potential for SNF and both are agreeable to SNF if recommended.  Nurse Case manager will continue to follow for progression to discharge for PT recommendation.   Expected Discharge Plan: Home w Home Health Services Barriers to Discharge: Continued Medical Work up   Patient Goals and CMS Choice Patient states their goals for this hospitalization and ongoing recovery are:: Home with daughter CMS Medicare.gov Compare Post Acute Care list provided to::  (NA) Choice offered to / list presented to : NA [place] ownership interest in [facility].provided to:: Parent NA    Expected Discharge Plan and Services In-house Referral: NA Discharge Planning Services: CM Consult   Living arrangements for the past 2 months: Single Family Home                 DME Arranged: N/A DME Agency: NA       HH Arranged: NA HH Agency: NA        Prior Living Arrangements/Services Living arrangements for the past 2 months: Single Family Home Lives with:: Adult Children, Relatives Patient language and need for interpreter reviewed:: Yes (Arabic) Do you feel safe going back to the place where you live?: Yes      Need for Family Participation in Patient  Care: Yes (Comment) Care giver support system in place?: Yes (comment) Current home services: DME, Homehealth aide, Other (comment) ([facility]-PT & PCA  and [facility]-oxygen) Criminal Activity/Legal Involvement Pertinent to Current Situation/Hospitalization: No - Comment as needed  Activities of Daily Living   ADL Screening (condition at time of admission) Independently performs ADLs?: No Does the patient have a NEW difficulty with bathing/dressing/toileting/self-feeding that is expected to last >3 days?: Yes (Initiates electronic notice to provider for possible OT consult) Does the patient have a NEW difficulty with getting in/out of bed, walking, or climbing stairs that is expected to last >3 days?: Yes (Initiates electronic notice to provider for possible PT consult) Does the patient have a NEW difficulty with communication that is expected to last >3 days?: No Is the patient deaf or have difficulty hearing?: No Does the patient have difficulty seeing, even when wearing glasses/contacts?: No Does the patient have difficulty concentrating, remembering, or making decisions?: Yes  Permission Sought/Granted Permission sought to share information with : Case Manager Permission granted to share information with : Yes, Verbal Permission Granted  Share Information with NAME: Latka,Alexandria (Daughter)  [phone]  Permission granted to share info w AGENCY: [facility] and [facility]        Emotional Assessment Appearance:: Appears stated age Attitude/Demeanor/Rapport: Engaged Affect (typically observed): Appropriate Orientation: : Oriented to Self, Oriented to Place, Oriented to  Time, Oriented to Situation Alcohol / Substance Use: Not Applicable Psych Involvement: No (  comment)  Admission diagnosis:  Encephalopathy acute [G93.40] Failure to thrive in adult [R62.7] Pleural effusion on left [J90] Falls [R29.6] Patient Active Problem List   Diagnosis Date Noted   Encephalopathy acute 08/09/2024    Gastroparesis 08/09/2024   Pancreatic cyst 08/09/2024   Acute heart failure with reduced ejection fraction (HFrEF, <= 40%) (HCC) 05/03/2024   Chronic HFrEF (heart failure with reduced ejection fraction) (HCC) 04/27/2024   Gout 04/27/2024   Esophageal dysmotility 04/27/2024   Falls 04/27/2024   Hypoxic respiratory failure (HCC) 04/27/2024   Sensory hearing loss, bilateral 02/20/2024   Postnasal drip 02/20/2024   Hypoxia 01/28/2024   Acute respiratory failure with hypoxia (HCC) 01/26/2024   Chest pain 12/04/2023   Symptomatic anemia 10/10/2023   Acute postoperative anemia due to expected blood loss 10/09/2023   S/P right hemicolectomy 10/08/2023   Cancer of right colon (HCC) 08/11/2023   AKI (acute kidney injury) 11/25/2022   Acute renal failure superimposed on stage 3b chronic kidney disease (HCC) 11/23/2022   Lactic acidosis 11/23/2022   Aortic atherosclerosis 11/20/2022   Pleural effusion, left    Dysphagia    Chronic pain 03/27/2022   Permanent atrial fibrillation (HCC) 01/29/2021   Hypercoagulable state due to paroxysmal atrial fibrillation (HCC) 01/29/2021   SIRS (systemic inflammatory response syndrome) (HCC) 10/31/2020   Abdominal pain 10/31/2020   GERD (gastroesophageal reflux disease) 10/31/2020   Chronic diastolic CHF (congestive heart failure) (HCC) 10/31/2020   Chronic atrial fibrillation (HCC) 07/11/2020   Campylobacter gastrointestinal tract infection 01/03/2019   CKD stage 3b, GFR 30-44 ml/min (HCC) 01/02/2019   Hypoalbuminemia 01/02/2019   Bandemia without diagnosis of specific infection 01/02/2019   Hypotension 01/02/2019   Bacteria in urine 01/01/2019   Atrial fibrillation with RVR (HCC) 03/20/2017   Myogenic ptosis of bilateral eyelids 06/24/2016   HTN (hypertension)    Tremor    Exudative age-related macular degeneration of right eye with active choroidal neovascularization (HCC) 08/29/2014   Pseudophakia of both eyes 08/29/2014   OSA (obstructive sleep  apnea) 06/29/2012   Type 2 diabetes mellitus (HCC)    Incidental lung nodule 03/10/2009   Shortness of breath 03/10/2009   Other diseases of lung, not elsewhere classified 03/10/2009   PCP:  Ilah Crigler, MD Pharmacy:   [id number] [place], [place] - [address] AT [facility] & [address] [place] [address] Phone: [phone] Fax: [phone]  [facility] [address] [place] [address] Phone: [phone] Fax: [phone]     Social Drivers of Health (SDOH) Social History: SDOH Screenings   Food Insecurity: No Food Insecurity (04/28/2024)  Housing: Low Risk  (05/03/2024)  Transportation Needs: No Transportation Needs (04/28/2024)  Utilities: Not At Risk (04/28/2024)  Alcohol Screen: Low Risk  (03/28/2022)  Financial Resource Strain: Low Risk  (03/28/2022)  Social Connections: Unknown (05/03/2024)  Tobacco Use: Low Risk  (08/09/2024)   SDOH Interventions:     Readmission Risk Interventions    08/10/2024   11:16 AM 10/12/2023    1:15 PM  Readmission Risk Prevention Plan  Transportation Screening Complete Complete  PCP or Specialist Appt within 5-7 Days Complete   PCP or Specialist Appt within 3-5 Days  Complete  Home Care Screening Complete   Medication Review (RN CM) Complete   HRI or Home Care Consult  Complete  Social Work Consult for Recovery Care Planning/Counseling  Complete  Palliative Care Screening  Not Applicable  Medication Review Oceanographer)  Complete

## 2024-08-11 DIAGNOSIS — G9340 Encephalopathy, unspecified: Secondary | ICD-10-CM | POA: Diagnosis not present

## 2024-08-11 DIAGNOSIS — G9341 Metabolic encephalopathy: Secondary | ICD-10-CM | POA: Diagnosis not present

## 2024-08-11 DIAGNOSIS — I482 Chronic atrial fibrillation, unspecified: Secondary | ICD-10-CM | POA: Diagnosis not present

## 2024-08-11 LAB — FOLATE: Folate: >20 ng/mL (ref >5.9)

## 2024-08-11 LAB — VITAMIN B12: Vitamin B-12: 449 pg/mL (ref 180–914)

## 2024-08-11 MED ORDER — ENSURE PLUS HIGH PROTEIN PO LIQD
237.0000 mL | Freq: Two times a day (BID) | ORAL | Status: DC
  Administered 2024-08-12: 237 mL via ORAL

## 2024-08-11 NOTE — Progress Notes (Signed)
 Patient refused morning labs. Phlebotomist re-timed them for later this morning and A. Andrez, NP was notified.

## 2024-08-11 NOTE — Progress Notes (Addendum)
 PROGRESS NOTE    Anne Roach  FMW:[id number] DOB: 10/30/1933 DOA: 08/09/2024 PCP: Ilah Crigler, MD   Brief Narrative: Anne Roach is a 89 y.o. female with a history of recurrent pleural effusion, atrial fibrillation, gout.  Patient presented secondary to altered mental status and history of falls. Unclear etiology.   Assessment and Plan:  Acute metabolic encephalopathy Unclear etiology. Initial CT head without etiology. Per daughter, patient will have unresponsiveness episodes, indicated by staring and not answering questions. TSH normal. History of low-normal vitamin B12, however improved now at 449. Normal folate and ammonia. Procalcitonin undetectable. Ceftriaxone  and azithromycin  started but unlikely pneumonia. EEG unremarkable for seizures. -Check MRI brain  Severe central canal stenosis at C5-6 Possibly contributing to patient's falls. Unlikely surgical candidate and after discussion with daughter, unlikely to pursue management. -Neurosurgery consulted and requested an MRI cervical spine  Recurrent left pleural effusion Patient has a history of multiple thoracenteses. Per last admission, consideration for possible catheter for recurrent effusion. Currently asymptomatic and on room air. Discussed with pulmonology, and no indication for Pleurx without diagnosis for fluid. If patient becomes symptomatic, will consider repeat thoracentesis.  Chronic atrial fibrillation Noted. Eliquis  held on admission. -Continue Eliquis  and Toprol  XL  History of Gout -Continue allopurinol   Right knee pain Secondary to prior fall. Patient seen and evaluated for this at an outside ED/hospital.  Chronic HFrEF Last LVEF of 40-45% from June 2025. Currently appears euvolemic except proBNP elevated at 4,884. Patient was previously on Lasix  which was discontinued.   DVT prophylaxis: Eliquis  Code Status:   Code Status: Full Code Family Communication: Daughter at bedside Disposition Plan:  Discharge likely back home in 1-2 days pending specialist recommendations, improvement of mental status, PT/OT recommendations   Consultants:  Neurosurgery  Procedures:  EEG  Antimicrobials: Ceftriaxone  Azithromycin     Subjective:  Interpreter: Hildegard [phone]  Patient reports no specific concerns today. Says she feels sad but won't expand on why she feels that way.  Objective: BP 114/74 (BP Location: Right Arm)   Pulse 91   Temp 98.6 F (37 C) (Oral)   Resp 17   Ht 5' 5 (1.651 m)   Wt 60.9 kg   SpO2 94%   BMI 22.34 kg/m   Examination:  General exam: Appears calm and comfortable Respiratory system: Diminished. Respiratory effort normal. Cardiovascular system: S1 & S2 heard, irregular rhythm, normal rate. Gastrointestinal system: Abdomen is nondistended, soft and nontender. Normal bowel sounds heard. Central nervous system: Alert and oriented to person and place.   Data Reviewed: I have personally reviewed following labs and imaging studies  CBC Lab Results  Component Value Date   WBC 7.6 08/10/2024   RBC 4.05 08/10/2024   HGB 12.2 08/10/2024   HCT 40.1 08/10/2024   MCV 99.0 08/10/2024   MCH 30.1 08/10/2024   PLT 210 08/10/2024   MCHC 30.4 08/10/2024   RDW 15.0 08/10/2024   LYMPHSABS 1.6 04/27/2024   MONOABS 0.5 04/27/2024   EOSABS 0.1 04/27/2024   BASOSABS 0.0 04/27/2024     Last metabolic panel Lab Results  Component Value Date   NA 142 08/10/2024   K 4.0 08/10/2024   CL 104 08/10/2024   CO2 24 08/10/2024   BUN 20 08/10/2024   CREATININE 0.93 08/10/2024   GLUCOSE 144 (H) 08/10/2024   GFRNONAA 58 (L) 08/10/2024   GFRAA 58 (L) 07/14/2020   CALCIUM 9.6 08/10/2024   PHOS 3.4 01/26/2024   PROT 7.8 08/09/2024   ALBUMIN 4.0 08/09/2024  BILITOT 0.6 08/09/2024   ALKPHOS 50 08/09/2024   AST 16 08/09/2024   ALT <5 08/09/2024   ANIONGAP 14 08/10/2024    GFR: Estimated Creatinine Clearance: 36.9 mL/min (by C-G formula based on SCr of 0.93  mg/dL).  Recent Results (from the past 240 hours)  Resp panel by RT-PCR (RSV, Flu A&B, Covid) Anterior Nasal Swab     Status: None   Collection Time: 08/09/24  5:54 PM   Specimen: Anterior Nasal Swab  Result Value Ref Range Status   SARS Coronavirus 2 by RT PCR NEGATIVE NEGATIVE Final    Comment: (NOTE) SARS-CoV-2 target nucleic acids are NOT DETECTED.  The SARS-CoV-2 RNA is generally detectable in upper respiratory specimens during the acute phase of infection. The lowest concentration of SARS-CoV-2 viral copies this assay can detect is 138 copies/mL. A negative result does not preclude SARS-Cov-2 infection and should not be used as the sole basis for treatment or other patient management decisions. A negative result may occur with  improper specimen collection/handling, submission of specimen other than nasopharyngeal swab, presence of viral mutation(s) within the areas targeted by this assay, and inadequate number of viral copies(<138 copies/mL). A negative result must be combined with clinical observations, patient history, and epidemiological information. The expected result is Negative.  Fact Sheet for Patients:  BloggerCourse.com  Fact Sheet for Healthcare Providers:  SeriousBroker.it  This test is no t yet approved or cleared by the [place]  FDA and  has been authorized for detection and/or diagnosis of SARS-CoV-2 by FDA under an Emergency Use Authorization (EUA). This EUA will remain  in effect (meaning this test can be used) for the duration of the COVID-19 declaration under Section 564(b)(1) of the Act, 21 U.S.C.section 360bbb-3(b)(1), unless the authorization is terminated  or revoked sooner.       Influenza A by PCR NEGATIVE NEGATIVE Final   Influenza B by PCR NEGATIVE NEGATIVE Final    Comment: (NOTE) The Xpert Xpress SARS-CoV-2/FLU/RSV plus assay is intended as an aid in the diagnosis of influenza from  Nasopharyngeal swab specimens and should not be used as a sole basis for treatment. Nasal washings and aspirates are unacceptable for Xpert Xpress SARS-CoV-2/FLU/RSV testing.  Fact Sheet for Patients: BloggerCourse.com  Fact Sheet for Healthcare Providers: SeriousBroker.it  This test is not yet approved or cleared by the [place]  FDA and has been authorized for detection and/or diagnosis of SARS-CoV-2 by FDA under an Emergency Use Authorization (EUA). This EUA will remain in effect (meaning this test can be used) for the duration of the COVID-19 declaration under Section 564(b)(1) of the Act, 21 U.S.C. section 360bbb-3(b)(1), unless the authorization is terminated or revoked.     Resp Syncytial Virus by PCR NEGATIVE NEGATIVE Final    Comment: (NOTE) Fact Sheet for Patients: BloggerCourse.com  Fact Sheet for Healthcare Providers: SeriousBroker.it  This test is not yet approved or cleared by the [place]  FDA and has been authorized for detection and/or diagnosis of SARS-CoV-2 by FDA under an Emergency Use Authorization (EUA). This EUA will remain in effect (meaning this test can be used) for the duration of the COVID-19 declaration under Section 564(b)(1) of the Act, 21 U.S.C. section 360bbb-3(b)(1), unless the authorization is terminated or revoked.  Performed at [facility], [address] [place], [place] [address]       Radiology Studies: EEG adult Result Date: 08/10/2024 Shelton Arlin KIDD, MD     08/10/2024  7:52 PM Patient Name: Anne Roach MRN:  [id number] Epilepsy Attending: Arlin MALVA Krebs Referring Physician/Provider: Briana Elgin LABOR, MD Date: 08/10/2024 Duration: 25.23 mins Patient history: 89yo F with ams. EEG to evaluate for seizure Level of alertness: Awake/ lethargic, asleep AEDs during EEG study: None Technical aspects: This EEG  study was done with scalp electrodes positioned according to the 10-20 International system of electrode placement. Electrical activity was reviewed with band pass filter of 1-70Hz , sensitivity of 7 uV/mm, display speed of 24mm/sec with a 60Hz  notched filter applied as appropriate. EEG data were recorded continuously and digitally stored.  Video monitoring was available and reviewed as appropriate. Description: EEG showed continuous generalized predominantly 5 to 6 Hz theta slowing admixed with intermittent 2-3hz  delta slowing.Sleep was characterized by sleep spindles (12 to 14 Hz), maximal frontocentral region. Hyperventilation and photic stimulation were not performed.   ABNORMALITY - Continuous slow, generalized IMPRESSION: This study is suggestive of moderate diffuse encephalopathy. No seizures or epileptiform discharges were seen throughout the recording. Priyanka O Yadav   CT Head Wo Contrast Result Date: 08/09/2024 EXAM: CT HEAD WITHOUT CONTRAST 08/09/2024 08:06:31 PM TECHNIQUE: CT of the head was performed without the administration of intravenous contrast. Automated exposure control, iterative reconstruction, and/or weight based adjustment of the mA/kV was utilized to reduce the radiation dose to as low as reasonably achievable. COMPARISON: None available. CLINICAL HISTORY: Head trauma, minor (Age >= 65y). Table formatting from the original note was not included.; Notes from triage:; Pt has had more falls within this month and seems more confused. Pt complains of pain all over. Family reports that her urine is dark and she is not eating well. FINDINGS: BRAIN AND VENTRICLES: Parenchymal volume loss is commensurate with the patient's age. Periventricular white matter changes are present likely reflecting the sequela of small vessel ischemia. No acute hemorrhage. No evidence of acute infarct. No hydrocephalus. No extra-axial collection. No mass effect or midline shift. ORBITS: No acute abnormality. SINUSES:  No acute abnormality. SOFT TISSUES AND SKULL: No acute soft tissue abnormality. No skull fracture. IMPRESSION: 1. No acute intracranial abnormality. 2. Parenchymal volume loss commensurate with the patient's age. 3. Periventricular white matter changes likely reflecting the sequela of small vessel ischemia. Electronically signed by: Dorethia Molt MD 08/09/2024 08:20 PM EDT RP Workstation: [id number]   CT Cervical Spine Wo Contrast Result Date: 08/09/2024 EXAM: CT CERVICAL SPINE WITHOUT CONTRAST 08/09/2024 08:06:31 PM TECHNIQUE: CT of the cervical spine was performed without the administration of intravenous contrast. Multiplanar reformatted images are provided for review. Automated exposure control, iterative reconstruction, and/or weight-based adjustment of the mA/kV was utilized to reduce the radiation dose to as low as reasonably achievable. COMPARISON: None available. CLINICAL HISTORY: Neck trauma (Age >= 65y). Patient has had more falls within this month and seems more confused. Patient complains of pain all over. Family reports that her urine is dark and she is not eating well. FINDINGS: CERVICAL SPINE: BONES AND ALIGNMENT: No acute fracture or listhesis. DEGENERATIVE CHANGES: Disc space narrowing and endplate remodeling throughout the cervical spine, most severe at C4-C6 in keeping with changes of advanced degenerative disc disease. 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 Multilevel uncovertebral and facet arthrosis is present with moderate to severe bilateral neural foraminal narrowing at C5-6. SOFT TISSUES: No prevertebral soft tissue swelling. A left pleural effusion is partially visualized within the left apex. Note: Right carotid bifurcation calcification is noted.  IMPRESSION: 1. No acute fracture or listhesis. 2. 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 3. Advanced degenerative disc disease, most severe at C4-C6. 4. Multilevel uncovertebral and facet arthrosis with moderate to severe bilateral neural foraminal narrowing at C5-6. Electronically signed by: Dorethia Molt MD 08/09/2024 08:17 PM EDT RP Workstation: [id number]   CT ABDOMEN PELVIS W CONTRAST Result Date: 08/09/2024 CLINICAL DATA:  Acute nonlocalized abdominal pain. Increasing falls this month and confusion. Pain all over. Dark urine. EXAM: CT ABDOMEN AND PELVIS WITH CONTRAST TECHNIQUE: Multidetector CT imaging of the abdomen and pelvis was performed using the standard protocol following bolus administration of intravenous contrast. RADIATION DOSE REDUCTION: This exam was performed according to the departmental dose-optimization program which includes automated exposure control, adjustment of the mA and/or kV according to patient size and/or use of iterative reconstruction technique. CONTRAST:  OMNIPAQUE  IOHEXOL  300 MG/ML  SOLN COMPARISON:  02/10/2024 FINDINGS: Lower chest: Large left pleural effusion with left lower lobe collapse. Cardiac enlargement with small pericardial effusion. Appearances are similar to the previous study. Hepatobiliary: No focal liver abnormality is seen. No gallstones, gallbladder wall thickening, or biliary dilatation. Pancreas: Diffuse pancreatic atrophy. Calcifications in the head of the pancreas consistent with chronic pancreatitis. Poorly defined cystic structure in the head of the pancreas measuring 1.2 cm diameter. No change since prior study. No acute infiltrative changes. Spleen: Normal in size without focal abnormality. Adrenals/Urinary Tract: No adrenal gland nodules. Renal nephrograms are symmetrical. Right renal cysts, largest measuring 2.1 cm diameter. No interval change. No imaging follow-up indicated. No hydronephrosis  or hydroureter. Bladder is normal. Stomach/Bowel: Stomach, small bowel, and colon are not abnormally distended. Previous right hemicolectomy with ileocolonic anastomosis. No wall thickening or inflammatory stranding identified. Vascular/Lymphatic: Aortic atherosclerosis. No enlarged abdominal or pelvic lymph nodes. Reproductive: Uterus and bilateral adnexa are unremarkable. Other: No abdominal wall hernia or abnormality. No abdominopelvic ascites. Musculoskeletal: Degenerative changes in the spine. No acute bony abnormalities. IMPRESSION: 1. Large left pleural effusion with collapse or consolidation of the left lower lung, similar to prior study. 2. Cardiac enlargement with small pericardial effusion. 3. No evidence of bowel obstruction or inflammation. 4. Cystic lesion in the head of the pancreas is unchanged since prior study. Due to patient's age, no imaging follow-up is indicated. Pancreatic calcifications likely representing chronic pancreatitis. 5. Aortic atherosclerosis. Electronically Signed   By: Elsie Gravely M.D.   On: 08/09/2024 20:14   DG Pelvis Portable Result Date: 08/09/2024 CLINICAL DATA:  Fall EXAM: PORTABLE PELVIS 1-2 VIEWS COMPARISON:  None Available. FINDINGS: There is no evidence of pelvic fracture or diastasis. No pelvic bone lesions are seen. IMPRESSION: Negative. Electronically Signed   By: Greig Pique M.D.   On: 08/09/2024 18:59   DG Chest Portable 1 View Result Date: 08/09/2024 CLINICAL DATA:  Fall EXAM: PORTABLE CHEST 1 VIEW COMPARISON:  Chest x-ray 04/29/2024 FINDINGS: There is a moderate left pleural effusion which has significantly increased from prior. Right lung is clear. There is no pneumothorax. Can not exclude underlying atelectasis/airspace disease in the left lung base. The heart is enlarged, unchanged. No acute osseous abnormality. IMPRESSION: Moderate left pleural effusion which has significantly increased from prior. Can not exclude underlying atelectasis/airspace  disease in the left lung base. Electronically Signed   By: Greig Pique M.D.   On: 08/09/2024 18:59      LOS:  2 days    Elgin Lam, MD [facility] 08/11/2024, 1:22 PM   If 7PM-7AM, please contact night-coverage [email or web address]

## 2024-08-11 NOTE — Plan of Care (Signed)
  Problem: Activity: Goal: Risk for activity intolerance will decrease Outcome: Progressing   Problem: Coping: Goal: Level of anxiety will decrease Outcome: Progressing   Problem: Pain Managment: Goal: General experience of comfort will improve and/or be controlled Outcome: Progressing   Problem: Safety: Goal: Ability to remain free from injury will improve Outcome: Progressing

## 2024-08-11 NOTE — Evaluation (Signed)
 Physical Therapy Evaluation Patient Details Name: Anne Roach MRN: [id number] DOB: Jan 26, 1933 Today's Date: 08/11/2024  History of Present Illness  89 y.o. female presented secondary to altered mental status and history of falls, PMH of recurrent pleural effusion, atrial fibrillation, gout.  Clinical Impression  Pt admitted with above diagnosis. Pt agreeable to consult. States that she lives with her dtr and uses 4WW to amb at home. She is able to complete home mobility at supervision A. She is mod I for bed mobility and transfers, uses RW to amb in the hallway at CGA/supervision A. Returns to room and resting comfortably in bed. Pt currently with functional limitations due to the deficits listed below (see PT Problem List). Pt will benefit from acute skilled PT to increase their independence and safety with mobility to allow discharge.           If plan is discharge home, recommend the following: A little help with walking and/or transfers;A little help with bathing/dressing/bathroom;Assistance with cooking/housework;Assist for transportation;Help with stairs or ramp for entrance   Can travel by private vehicle        Equipment Recommendations Rolling walker (2 wheels)  Recommendations for Other Services       Functional Status Assessment Patient has had a recent decline in their functional status and demonstrates the ability to make significant improvements in function in a reasonable and predictable amount of time.     Precautions / Restrictions Precautions Precautions: Fall Recall of Precautions/Restrictions: Intact Restrictions Weight Bearing Restrictions Per Provider Order: No      Mobility  Bed Mobility Overal bed mobility: Modified Independent                  Transfers Overall transfer level: Modified independent Equipment used: Rolling walker (2 wheels) Transfers: Sit to/from Stand Sit to Stand: Modified independent (Device/Increase time)                 Ambulation/Gait Ambulation/Gait assistance: Supervision Gait Distance (Feet): 175 Feet Assistive device: Rolling walker (2 wheels) Gait Pattern/deviations: Step-through pattern, Trunk flexed Gait velocity: dec     General Gait Details: pt able to amb from room in the hallway and back with reciporcal pattern, able to navigate obstacles, intermittent standing rest breaks.  Stairs            Wheelchair Mobility     Tilt Bed    Modified Rankin (Stroke Patients Only)       Balance Overall balance assessment: Needs assistance Sitting-balance support: No upper extremity supported, Feet supported Sitting balance-Leahy Scale: Good     Standing balance support: No upper extremity supported, During functional activity Standing balance-Leahy Scale: Fair                               Pertinent Vitals/Pain Pain Assessment Pain Assessment: No/denies pain Pain Location: R knee intermittently but none noted during session. Pain Intervention(s): Monitored during session    Home Living Family/patient expects to be discharged to:: Private residence Living Arrangements: Children Available Help at Discharge: Family;Available PRN/intermittently;Personal care attendant Type of Home: House Home Access: Stairs to enter;Ramped entrance Entrance Stairs-Rails: Right;Left Entrance Stairs-Number of Steps: 3 Alternate Level Stairs-Number of Steps: 13 Home Layout: Multi-level;Able to live on main level with bedroom/bathroom Home Equipment: Rolling Walker (2 wheels);Grab bars - tub/shower;Shower seat - built in;BSC/3in1;Rollator (4 wheels);Hand held shower head;Other (comment);Electric scooter;Transport chair;Lift chair Additional Comments: Pt lives with daughter who works, [employer] 7X/wk  for 2.5 hours.    Prior Function Prior Level of Function : Needs assist;History of Falls (last six months)             Mobility Comments: amb with rollator, 2 recent falls, 1 month  ago, 1 week ago. ADLs Comments: Needs help with dressing/bathing, meals, supervision for ambulation     Extremity/Trunk Assessment   Upper Extremity Assessment Upper Extremity Assessment: Defer to OT evaluation    Lower Extremity Assessment Lower Extremity Assessment: Generalized weakness       Communication   Communication Communication: Impaired Factors Affecting Communication: Hearing impaired;Non - English speaking, interpreter not available    Cognition Arousal: Alert Behavior During Therapy: WFL for tasks assessed/performed   PT - Cognitive impairments: No apparent impairments                         Following commands: Intact       Cueing Cueing Techniques: Verbal cues, Gestural cues, Tactile cues     General Comments      Exercises     Assessment/Plan    PT Assessment Patient needs continued PT services  PT Problem List Decreased strength;Decreased balance;Decreased mobility;Decreased activity tolerance       PT Treatment Interventions DME instruction;Functional mobility training;Balance training;Patient/family education;Gait training;Therapeutic activities;Therapeutic exercise    PT Goals (Current goals can be found in the Care Plan section)  Acute Rehab PT Goals Patient Stated Goal: return home PT Goal Formulation: With patient Time For Goal Achievement: 08/25/24 Potential to Achieve Goals: Good    Frequency Min 2X/week     Co-evaluation               AM-PAC PT 6 Clicks Mobility  Outcome Measure Help needed turning from your back to your side while in a flat bed without using bedrails?: None Help needed moving from lying on your back to sitting on the side of a flat bed without using bedrails?: None Help needed moving to and from a bed to a chair (including a wheelchair)?: None Help needed standing up from a chair using your arms (e.g., wheelchair or bedside chair)?: None Help needed to walk in hospital room?: A  Little Help needed climbing 3-5 steps with a railing? : A Little 6 Click Score: 22    End of Session Equipment Utilized During Treatment: Gait belt Activity Tolerance: Patient tolerated treatment well Patient left: in bed;with bed alarm set;with call bell/phone within reach Nurse Communication: Mobility status PT Visit Diagnosis: Muscle weakness (generalized) (M62.81);Difficulty in walking, not elsewhere classified (R26.2)    Time: 8482-8459 PT Time Calculation (min) (ACUTE ONLY): 23 min   Charges:   PT Evaluation $PT Eval Low Complexity: 1 Low PT Treatments $Gait Training: 8-22 mins PT General Charges $$ ACUTE PT VISIT: 1 Visit         Stann, PT Acute Rehabilitation Services Office: [phone] 08/11/2024   Stann DELENA Ohara 08/11/2024, 4:02 PM

## 2024-08-12 ENCOUNTER — Inpatient Hospital Stay (HOSPITAL_COMMUNITY)

## 2024-08-12 DIAGNOSIS — G9341 Metabolic encephalopathy: Secondary | ICD-10-CM | POA: Diagnosis not present

## 2024-08-12 DIAGNOSIS — R4182 Altered mental status, unspecified: Secondary | ICD-10-CM

## 2024-08-12 DIAGNOSIS — M542 Cervicalgia: Secondary | ICD-10-CM

## 2024-08-12 DIAGNOSIS — G9340 Encephalopathy, unspecified: Secondary | ICD-10-CM | POA: Diagnosis not present

## 2024-08-12 MED ORDER — LORAZEPAM 1 MG PO TABS
1.0000 mg | ORAL_TABLET | Freq: Once | ORAL | Status: AC | PRN
  Administered 2024-08-12: 1 mg via ORAL
  Filled 2024-08-12: qty 1

## 2024-08-12 MED ORDER — GABAPENTIN 400 MG PO CAPS: 400.0000 mg | ORAL_CAPSULE | Freq: Two times a day (BID) | ORAL | Status: AC

## 2024-08-12 NOTE — Discharge Instructions (Addendum)
 Anne Roach,  You were in the hospital with some confusion and falls. Your confusion appears to have improved and we could not find a reversible cause. Your workup was negative. Regarding your falls, your neck imaging shows that you have some severe narrowing in your spine that is pushing on your spinal corder. This is likely contributing to your falls. The neurosurgeon wants to see you in his office, since he is recommending no surgery for management. Physical therapy has recommended home health therapy services. Please follow-up with your PCP. I will refer you to neurology.

## 2024-08-12 NOTE — Care Management Obs Status (Signed)
 MEDICARE OBSERVATION STATUS NOTIFICATION   Patient Details  Name: Anne Roach MRN: [id number] Date of Birth: [birth year]   Medicare Observation Status Notification Given:  Yes (Letter given, patient is alert and oriented times 4 and able to speak and understand English although she is initially slow to respond)    [name], RN 08/12/2024, 3:43 PM

## 2024-08-12 NOTE — TOC Transition Note (Signed)
 Transition of Care [facility]) - Discharge Note   Patient Details  Name: KASHAWNA MANZER MRN: [id number] Date of Birth: August 23, 1933  Transition of Care [facility]) CM/SW Contact:  [name], RN Phone Number:[phone]  08/12/2024, 3:53 PM   Clinical Narrative:    HH services will resume with [facility]. Cory with [facility] has been updated.     Barriers to Discharge: Continued Medical Work up   Patient Goals and CMS Choice Patient states their goals for this hospitalization and ongoing recovery are:: Home with daughter CMS Medicare.gov Compare Post Acute Care list provided to::  (NA) Choice offered to / list presented to : NA [place] ownership interest in [facility].provided to:: Parent NA    Discharge Placement                       Discharge Plan and Services Additional resources added to the After Visit Summary for   In-house Referral: NA Discharge Planning Services: CM Consult            DME Arranged: N/A DME Agency: NA       HH Arranged: NA HH Agency: NA        Social Drivers of Health (SDOH) Interventions SDOH Screenings   Food Insecurity: No Food Insecurity (04/28/2024)  Housing: Low Risk  (05/03/2024)  Transportation Needs: No Transportation Needs (04/28/2024)  Utilities: Not At Risk (04/28/2024)  Alcohol Screen: Low Risk  (03/28/2022)  Financial Resource Strain: Low Risk  (03/28/2022)  Social Connections: Unknown (05/03/2024)  Tobacco Use: Low Risk  (08/09/2024)     Readmission Risk Interventions    08/10/2024   11:16 AM 10/12/2023    1:15 PM  Readmission Risk Prevention Plan  Transportation Screening Complete Complete  PCP or Specialist Appt within 5-7 Days Complete   PCP or Specialist Appt within 3-5 Days  Complete  Home Care Screening Complete   Medication Review (RN CM) Complete   HRI or Home Care Consult  Complete  Social Work Consult for Recovery Care Planning/Counseling  Complete  Palliative Care Screening  Not Applicable   Medication Review Oceanographer)  Complete

## 2024-08-12 NOTE — Discharge Summary (Signed)
 Physician Discharge Summary   Patient: Anne Roach MRN: [id number] DOB: 1933/01/17  Admit date:     08/09/2024  Discharge date: 08/12/24  Discharge Physician: Elgin Lam, MD   PCP: Ilah Crigler, MD   Recommendations at discharge:  PCP visit for hospital follow-up Neurology visit for cognitive impairment concerns Neurosurgery visit for follow-up on cervical stenosis with cord compression  Discharge Diagnoses: Principal Problem:   Encephalopathy acute Active Problems:   OSA (obstructive sleep apnea)   Type 2 diabetes mellitus (HCC)   Atrial fibrillation with RVR (HCC)   Chronic atrial fibrillation (HCC)   Acute heart failure with reduced ejection fraction (HFrEF, <= 40%) (HCC)  Resolved Problems:   * No resolved hospital problems. *  Hospital Course: Anne Roach is a 89 y.o. female with a history of recurrent pleural effusion, atrial fibrillation, gout.  Patient presented secondary to altered mental status and history of falls. Unclear etiology. Patient improved. Workup was negative for etiology of mental status impairment. During workup, patient was found to have severe cervical stenosis with cord compression. Neurosurgery consulted and recommended outpatient follow-up. PT/OT evaluated the patient and recommended home health services.  Assessment and Plan:  Acute metabolic encephalopathy Unclear etiology. Initial CT head without etiology. Per daughter, patient will have unresponsiveness episodes, indicated by staring and not answering questions. TSH normal. History of low-normal vitamin B12, however improved now at 449. Normal folate and ammonia. Procalcitonin undetectable. Ceftriaxone  and azithromycin  started but unlikely pneumonia. EEG unremarkable for seizures. MRI brain unremarkable for acute or chronic processes. Neurology referral on discharge.   Severe central canal stenosis at C5-6 Cervical spine cord compression at C5-6 Possibly contributing to patient's falls.  Unlikely surgical candidate and after discussion with daughter, unlikely to pursue management. MRI cervical spine obtained and confirmed cord compression. Neurosurgery, Dr. Debby, recommends no inpatient surgical management and instead recommends outpatient follow-up.   Recurrent left pleural effusion Patient has a history of multiple thoracenteses. Per last admission, consideration for possible catheter for recurrent effusion. Currently asymptomatic and on room air. Discussed with pulmonology, and no indication for Pleurx without diagnosis for fluid; when discussing with pulmonology, low yield in repeat diagnostic thoracentesis after multiple prior diagnostic thoracenteses. No thoracentesis performed as patient remained asymptomatic.   Chronic atrial fibrillation Noted. Eliquis  held on admission. Toprol  XL. Recommend holding Eliquis  on discharge secondary to high risk of recurrent falls.   History of Gout Continue allopurinol .   Right knee pain Secondary to prior fall. Patient seen and evaluated for this at an outside ED/hospital.   Chronic HFrEF Last LVEF of 40-45% from June 2025. Currently appears euvolemic except proBNP elevated at 4,884. Resume home regimen.   Consultants: Neurosurgery Procedures performed: None  Disposition: Home health Diet recommendation: Regular diet   DISCHARGE MEDICATION: Allergies as of 08/12/2024   No Known Allergies      Medication List     PAUSE taking these medications    Eliquis  5 MG Tabs tablet Wait to take this until your doctor or other care provider tells you to start again. Generic drug: apixaban  TAKE 1 TABLET(5 MG) BY MOUTH TWICE DAILY   folic acid  800 MCG tablet Wait to take this until your doctor or other care provider tells you to start again. Commonly known as: FOLVITE  Take 800 mcg by mouth daily.   Jardiance  10 MG Tabs tablet Wait to take this until your doctor or other care provider tells you to start again. Generic drug:  empagliflozin  TAKE 1 TABLET(10 MG) BY  MOUTH DAILY       STOP taking these medications    amoxicillin 500 MG tablet Commonly known as: AMOXIL   loratadine  10 MG tablet Commonly known as: CLARITIN    propranolol  20 MG tablet Commonly known as: INDERAL        TAKE these medications    Accu-Chek Aviva Plus test strip Generic drug: glucose blood   acetaminophen  500 MG tablet Commonly known as: TYLENOL  Take 1,000 mg by mouth every 6 (six) hours as needed for mild pain or headache.   allopurinol  100 MG tablet Commonly known as: ZYLOPRIM  TAKE 1 TABLET(100 MG) BY MOUTH DAILY What changed: See the new instructions.   diclofenac  Sodium 1 % Gel Commonly known as: VOLTAREN  Apply 4 g topically 4 (four) times daily. What changed:  when to take this reasons to take this   feeding supplement Liqd Take 237 mLs by mouth 2 (two) times daily between meals.   furosemide  20 MG tablet Commonly known as: LASIX  Take 1 tablet (20 mg total) by mouth daily as needed for fluid or edema. Please take one tablet if you have leg swelling, shortness of breath, gain more than 3 pounds in 24 hours or 5 pounds in one week   Fusion Plus Caps Take 1 capsule by mouth every other day.   gabapentin  400 MG capsule Commonly known as: NEURONTIN  Take 1 capsule (400 mg total) by mouth 2 (two) times daily. What changed: when to take this   ICAPS AREDS 2 PO Take 1 capsule by mouth 2 (two) times daily.   ipratropium 0.06 % nasal spray Commonly known as: ATROVENT  Place 2 sprays into both nostrils 2 (two) times daily as needed (nasal drainage).   metoprolol  succinate 25 MG 24 hr tablet Commonly known as: TOPROL -XL Take 0.5 tablets (12.5 mg total) by mouth daily. Take with or immediately following a meal.   ondansetron  4 MG disintegrating tablet Commonly known as: ZOFRAN -ODT Take 4 mg by mouth every 8 (eight) hours as needed for nausea or vomiting.   oxyCODONE  5 MG immediate release tablet Commonly  known as: Oxy IR/ROXICODONE  Take 5 mg by mouth 4 (four) times daily as needed.   pantoprazole  40 MG tablet Commonly known as: PROTONIX  Take 40 mg by mouth every morning.   Potassium Chloride  ER 20 MEQ Tbcr Take 20 mEq by mouth daily.   Vitamin D  125 MCG (5000 UT) Caps Take 5,000 Units by mouth every evening.        Follow-up Information     [facility], [facility] Follow up.   Specialty: Home Health Services Why: Your home health will resume with [facility]. The office will call you to resume services. Contact information: [address] [place] [address] [phone]         Ilah Crigler, MD. Schedule an appointment as soon as possible for a visit in 1 week(s).   Specialty: Family Medicine Why: For hospital follow-up Contact information: [address] [place] [address] [phone]         Debby Dorn MATSU, MD. Schedule an appointment as soon as possible for a visit in 1 week(s).   Specialty: Neurosurgery Why: For hospital follow-up. Cervical stenosis. Contact information: [address] [place] [address] [phone]                Discharge Exam: BP 121/78 (BP Location: Left Arm)   Pulse 86   Temp 98.6 F (37 C) (Oral)   Resp 20  Ht 5' 5 (1.651 m)   Wt 60.9 kg   SpO2 93%   BMI 22.34 kg/m   General exam: Appears calm and comfortable Respiratory system: Respiratory effort normal.   Condition at discharge: stable  The results of significant diagnostics from this hospitalization (including imaging, microbiology, ancillary and laboratory) are listed below for reference.   Imaging Studies: MR CERVICAL SPINE WO CONTRAST Result Date: 08/12/2024 CLINICAL DATA:  Neck pain EXAM: MRI CERVICAL SPINE WITHOUT CONTRAST TECHNIQUE: Multiplanar, multisequence MR imaging of the cervical spine was performed. No intravenous contrast was administered. COMPARISON:  None Available. FINDINGS: The craniocervical junction is  normal. There is no significant bone marrow signal abnormality. There is compression of the cord at the C5 and C6 levels C2-C3: There is a mild disc bulge. Mild facet arthropathy. No significant spinal stenosis or foraminal stenosis C3-C4: There is a mild disc bulge with effacement of the thecal sac without compression of the cord, moderate spinal stenosis. There is mild facet arthropathy. No significant foraminal stenosis C4-C5: There is severe degenerative disc disease with a disc bulge and vertebral endplate osteophytes. There is effacement of the thecal sac without compression of the cord, moderate spinal stenosis. There are small foraminal spurs with mild bilateral neural foraminal stenosis C5-C6: There is severe degenerative disc disease with central disc herniation and ossification of the posterior longitudinal ligament. There is severe spinal stenosis with compression of the cord. C6-C7: There is moderate degenerative disc disease with a mild disc bulge. There is a foraminal spur on the right. Mild facet arthropathy. There is no significant spinal stenosis. Mild right neural foraminal stenosis C7-T1: The disc is normal. Mild facet arthropathy on the right. No spinal stenosis or foraminal stenosis IMPRESSION: Severe spinal stenosis with compression of the cord at the C5-6 level due to central disc herniation with ossification of the posterior longitudinal ligament. Large left pleural effusion completely filling the left hemithorax Electronically Signed   By: Nancyann Burns M.D.   On: 08/12/2024 14:18   MR BRAIN WO CONTRAST Result Date: 08/12/2024 CLINICAL DATA:  Altered mental status EXAM: MRI HEAD WITHOUT CONTRAST TECHNIQUE: Multiplanar, multiecho pulse sequences of the brain and surrounding structures were obtained without intravenous contrast. COMPARISON:  October 22, 2019 FINDINGS: MRI brain: Mild atrophy The signal in the brain parenchyma is normal. There is no acute or chronic infarct. The ventricles  are normal. No mass lesion. There are normal flow signals in the carotid arteries and basilar artery. No significant bone marrow signal abnormality. No significant abnormality in the paranasal sinuses or soft tissues. IMPRESSION: No significant abnormality Electronically Signed   By: Nancyann Burns M.D.   On: 08/12/2024 14:14   EEG adult Result Date: 08/10/2024 Shelton Arlin KIDD, MD     08/10/2024  7:52 PM Patient Name: Anne Roach MRN: [id number] Epilepsy Attending: Arlin KIDD Shelton Referring Physician/Provider: Briana Elgin LABOR, MD Date: 08/10/2024 Duration: 25.23 mins Patient history: 89yo F with ams. EEG to evaluate for seizure Level of alertness: Awake/ lethargic, asleep AEDs during EEG study: None Technical aspects: This EEG study was done with scalp electrodes positioned according to the 10-20 International system of electrode placement. Electrical activity was reviewed with band pass filter of 1-70Hz , sensitivity of 7 uV/mm, display speed of 19mm/sec with a 60Hz  notched filter applied as appropriate. EEG data were recorded continuously and digitally stored.  Video monitoring was available and reviewed as appropriate. Description: EEG showed continuous generalized predominantly 5 to 6 Hz theta slowing admixed with intermittent  2-3hz  delta slowing.Sleep was characterized by sleep spindles (12 to 14 Hz), maximal frontocentral region. Hyperventilation and photic stimulation were not performed.   ABNORMALITY - Continuous slow, generalized IMPRESSION: This study is suggestive of moderate diffuse encephalopathy. No seizures or epileptiform discharges were seen throughout the recording. Priyanka O Yadav   CT Head Wo Contrast Result Date: 08/09/2024 EXAM: CT HEAD WITHOUT CONTRAST 08/09/2024 08:06:31 PM TECHNIQUE: CT of the head was performed without the administration of intravenous contrast. Automated exposure control, iterative reconstruction, and/or weight based adjustment of the mA/kV was utilized to reduce  the radiation dose to as low as reasonably achievable. COMPARISON: None available. CLINICAL HISTORY: Head trauma, minor (Age >= 65y). Table formatting from the original note was not included.; Notes from triage:; Pt has had more falls within this month and seems more confused. Pt complains of pain all over. Family reports that her urine is dark and she is not eating well. FINDINGS: BRAIN AND VENTRICLES: Parenchymal volume loss is commensurate with the patient's age. Periventricular white matter changes are present likely reflecting the sequela of small vessel ischemia. No acute hemorrhage. No evidence of acute infarct. No hydrocephalus. No extra-axial collection. No mass effect or midline shift. ORBITS: No acute abnormality. SINUSES: No acute abnormality. SOFT TISSUES AND SKULL: No acute soft tissue abnormality. No skull fracture. IMPRESSION: 1. No acute intracranial abnormality. 2. Parenchymal volume loss commensurate with the patient's age. 3. Periventricular white matter changes likely reflecting the sequela of small vessel ischemia. Electronically signed by: Dorethia Molt MD 08/09/2024 08:20 PM EDT RP Workstation: [id number]   CT Cervical Spine Wo Contrast Result Date: 08/09/2024 EXAM: CT CERVICAL SPINE WITHOUT CONTRAST 08/09/2024 08:06:31 PM TECHNIQUE: CT of the cervical spine was performed without the administration of intravenous contrast. Multiplanar reformatted images are provided for review. Automated exposure control, iterative reconstruction, and/or weight-based adjustment of the mA/kV was utilized to reduce the radiation dose to as low as reasonably achievable. COMPARISON: None available. CLINICAL HISTORY: Neck trauma (Age >= 65y). Patient has had more falls within this month and seems more confused. Patient complains of pain all over. Family reports that her urine is dark and she is not eating well. FINDINGS: CERVICAL SPINE: BONES AND ALIGNMENT: No acute fracture or listhesis. DEGENERATIVE CHANGES:  Disc space narrowing and endplate remodeling throughout the cervical spine, most severe at C4-C6 in keeping with changes of advanced degenerative disc disease. 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 Multilevel uncovertebral and facet arthrosis is present with moderate to severe bilateral neural foraminal narrowing at C5-6. SOFT TISSUES: No prevertebral soft tissue swelling. A left pleural effusion is partially visualized within the left apex. Note: Right carotid bifurcation calcification is noted. IMPRESSION: 1. No acute fracture or listhesis. 2. Severe central canal stenosis at C5-6 due to bulky ossification of the posterior longitudinal ligament, with flattening of the thecal sac and AP diameter of the spinal canal of 3 mm. 3. Advanced degenerative disc disease, most severe at C4-C6. 4. Multilevel uncovertebral and facet arthrosis with moderate to severe bilateral neural foraminal narrowing at C5-6. Electronically signed by: Dorethia Molt MD 08/09/2024 08:17 PM EDT RP Workstation: [id number]   CT ABDOMEN PELVIS W CONTRAST Result Date: 08/09/2024 CLINICAL DATA:  Acute nonlocalized abdominal pain. Increasing falls this month and confusion. Pain all over. Dark urine. EXAM: CT ABDOMEN AND PELVIS WITH CONTRAST TECHNIQUE: Multidetector CT imaging of  the abdomen and pelvis was performed using the standard protocol following bolus administration of intravenous contrast. RADIATION DOSE REDUCTION: This exam was performed according to the departmental dose-optimization program which includes automated exposure control, adjustment of the mA and/or kV according to patient size and/or use of iterative reconstruction technique. CONTRAST:  OMNIPAQUE  IOHEXOL  300 MG/ML  SOLN COMPARISON:  02/10/2024 FINDINGS: Lower chest: Large left pleural effusion with left lower lobe collapse.  Cardiac enlargement with small pericardial effusion. Appearances are similar to the previous study. Hepatobiliary: No focal liver abnormality is seen. No gallstones, gallbladder wall thickening, or biliary dilatation. Pancreas: Diffuse pancreatic atrophy. Calcifications in the head of the pancreas consistent with chronic pancreatitis. Poorly defined cystic structure in the head of the pancreas measuring 1.2 cm diameter. No change since prior study. No acute infiltrative changes. Spleen: Normal in size without focal abnormality. Adrenals/Urinary Tract: No adrenal gland nodules. Renal nephrograms are symmetrical. Right renal cysts, largest measuring 2.1 cm diameter. No interval change. No imaging follow-up indicated. No hydronephrosis or hydroureter. Bladder is normal. Stomach/Bowel: Stomach, small bowel, and colon are not abnormally distended. Previous right hemicolectomy with ileocolonic anastomosis. No wall thickening or inflammatory stranding identified. Vascular/Lymphatic: Aortic atherosclerosis. No enlarged abdominal or pelvic lymph nodes. Reproductive: Uterus and bilateral adnexa are unremarkable. Other: No abdominal wall hernia or abnormality. No abdominopelvic ascites. Musculoskeletal: Degenerative changes in the spine. No acute bony abnormalities. IMPRESSION: 1. Large left pleural effusion with collapse or consolidation of the left lower lung, similar to prior study. 2. Cardiac enlargement with small pericardial effusion. 3. No evidence of bowel obstruction or inflammation. 4. Cystic lesion in the head of the pancreas is unchanged since prior study. Due to patient's age, no imaging follow-up is indicated. Pancreatic calcifications likely representing chronic pancreatitis. 5. Aortic atherosclerosis. Electronically Signed   By: Elsie Gravely M.D.   On: 08/09/2024 20:14   DG Pelvis Portable Result Date: 08/09/2024 CLINICAL DATA:  Fall EXAM: PORTABLE PELVIS 1-2 VIEWS COMPARISON:  None Available. FINDINGS:  There is no evidence of pelvic fracture or diastasis. No pelvic bone lesions are seen. IMPRESSION: Negative. Electronically Signed   By: Greig Pique M.D.   On: 08/09/2024 18:59   DG Chest Portable 1 View Result Date: 08/09/2024 CLINICAL DATA:  Fall EXAM: PORTABLE CHEST 1 VIEW COMPARISON:  Chest x-ray 04/29/2024 FINDINGS: There is a moderate left pleural effusion which has significantly increased from prior. Right lung is clear. There is no pneumothorax. Can not exclude underlying atelectasis/airspace disease in the left lung base. The heart is enlarged, unchanged. No acute osseous abnormality. IMPRESSION: Moderate left pleural effusion which has significantly increased from prior. Can not exclude underlying atelectasis/airspace disease in the left lung base. Electronically Signed   By: Greig Pique M.D.   On: 08/09/2024 18:59    Microbiology: Results for orders placed or performed during the hospital encounter of 08/09/24  Resp panel by RT-PCR (RSV, Flu A&B, Covid) Anterior Nasal Swab     Status: None   Collection Time: 08/09/24  5:54 PM   Specimen: Anterior Nasal Swab  Result Value Ref Range Status   SARS Coronavirus 2 by RT PCR NEGATIVE NEGATIVE Final    Comment: (NOTE) SARS-CoV-2 target nucleic acids are NOT DETECTED.  The SARS-CoV-2 RNA is generally detectable in upper respiratory specimens during the acute phase of infection. The lowest concentration of SARS-CoV-2 viral copies this assay can detect is 138 copies/mL. A negative result does not preclude SARS-Cov-2 infection and should not be used as the sole  basis for treatment or other patient management decisions. A negative result may occur with  improper specimen collection/handling, submission of specimen other than nasopharyngeal swab, presence of viral mutation(s) within the areas targeted by this assay, and inadequate number of viral copies(<138 copies/mL). A negative result must be combined with clinical observations, patient  history, and epidemiological information. The expected result is Negative.  Fact Sheet for Patients:  BloggerCourse.com  Fact Sheet for Healthcare Providers:  SeriousBroker.it  This test is no t yet approved or cleared by the [place]  FDA and  has been authorized for detection and/or diagnosis of SARS-CoV-2 by FDA under an Emergency Use Authorization (EUA). This EUA will remain  in effect (meaning this test can be used) for the duration of the COVID-19 declaration under Section 564(b)(1) of the Act, 21 U.S.C.section 360bbb-3(b)(1), unless the authorization is terminated  or revoked sooner.       Influenza A by PCR NEGATIVE NEGATIVE Final   Influenza B by PCR NEGATIVE NEGATIVE Final    Comment: (NOTE) The Xpert Xpress SARS-CoV-2/FLU/RSV plus assay is intended as an aid in the diagnosis of influenza from Nasopharyngeal swab specimens and should not be used as a sole basis for treatment. Nasal washings and aspirates are unacceptable for Xpert Xpress SARS-CoV-2/FLU/RSV testing.  Fact Sheet for Patients: BloggerCourse.com  Fact Sheet for Healthcare Providers: SeriousBroker.it  This test is not yet approved or cleared by the [place]  FDA and has been authorized for detection and/or diagnosis of SARS-CoV-2 by FDA under an Emergency Use Authorization (EUA). This EUA will remain in effect (meaning this test can be used) for the duration of the COVID-19 declaration under Section 564(b)(1) of the Act, 21 U.S.C. section 360bbb-3(b)(1), unless the authorization is terminated or revoked.     Resp Syncytial Virus by PCR NEGATIVE NEGATIVE Final    Comment: (NOTE) Fact Sheet for Patients: BloggerCourse.com  Fact Sheet for Healthcare Providers: SeriousBroker.it  This test is not yet approved or cleared by the [place]  FDA  and has been authorized for detection and/or diagnosis of SARS-CoV-2 by FDA under an Emergency Use Authorization (EUA). This EUA will remain in effect (meaning this test can be used) for the duration of the COVID-19 declaration under Section 564(b)(1) of the Act, 21 U.S.C. section 360bbb-3(b)(1), unless the authorization is terminated or revoked.  Performed at [facility], [address] [place], [place] [address]     Labs: CBC: Recent Labs  Lab 08/09/24 1538 08/10/24 0103  WBC 7.6 7.6  HGB 12.5 12.2  HCT 40.6 40.1  MCV 98.3 99.0  PLT 225 210   Basic Metabolic Panel: Recent Labs  Lab 08/09/24 1538 08/10/24 0103  NA 142 142  K 4.0 4.0  CL 104 104  CO2 25 24  GLUCOSE 146* 144*  BUN 22 20  CREATININE 1.01* 0.93  CALCIUM 9.9 9.6   Liver Function Tests: Recent Labs  Lab 08/09/24 1538  AST 16  ALT <5  ALKPHOS 50  BILITOT 0.6  PROT 7.8  ALBUMIN 4.0   CBG: Recent Labs  Lab 08/09/24 1552  GLUCAP 127*    Discharge time spent: 35 minutes.  Signed: Elgin Lam, MD [facility] 08/12/2024

## 2024-08-12 NOTE — Progress Notes (Signed)
 Initial Nutrition Assessment  INTERVENTION:   -Ensure Plus High Protein po BID, each supplement provides 350 kcal and 20 grams of protein.   -Magic cup TID with meals, each supplement provides 290 kcal and 9 grams of protein   NUTRITION DIAGNOSIS:   Inadequate oral intake related to acute illness as evidenced by per patient/family report.  GOAL:   Patient will meet greater than or equal to 90% of their needs  MONITOR:   PO intake, Supplement acceptance  REASON FOR ASSESSMENT:   Consult Assessment of nutrition requirement/status  ASSESSMENT:   89 y.o. female with a history of recurrent pleural effusion, atrial fibrillation, gout.  Patient presented secondary to altered mental status and history of falls.  Patient having MRI at time of visit. Will attempt to gather history at later time. Per chart review, pt's family reported pt was not eating well PTA, having increased AMS and falls at home.  Pt continues to not eat well. Ensure supplements have been ordered and accepted so far. Will order Magic cups with meals as well.  Per weight records, pt has lost 14 lbs since 6/16 (9% wt loss x 3 months, significant for time frame).  Medications reviewed.  Labs reviewed.  NUTRITION - FOCUSED PHYSICAL EXAM:  Unable to complete, pt not in room  Diet Order:   Diet Order             Diet regular Room service appropriate? Yes; Fluid consistency: Thin  Diet effective now                   EDUCATION NEEDS:   Not appropriate for education at this time  Skin:  Skin Assessment: Reviewed RN Assessment  Last BM:  9/23  Height:   Ht Readings from Last 1 Encounters:  08/10/24 5' 5 (1.651 m)    Weight:   Wt Readings from Last 1 Encounters:  08/10/24 60.9 kg    BMI:  Body mass index is 22.34 kg/m.  Estimated Nutritional Needs:   Kcal:  1600-1800  Protein:  75-90g  Fluid:  1.6L/day  Morna Lee, MS, RD, LDN Inpatient Clinical Dietitian Contact via  Secure chat

## 2024-08-12 NOTE — Progress Notes (Signed)
 Neurosurgery  I reviewed MRI cervical spine.  Severe cervical stenosis is confirmed.   Could consider a C5-7 posterior cervical decompression with or without laminoplasty which would decrease the chance of progressive myelopathy symptoms.  However, with her age and  medical comorbidities, the risk of surgery likely outweigh  any potential benefit.  Would recommend she follow up in  neurosurgery clinic with her family for  further discussion of this chronic degenerative condition.

## 2024-08-12 NOTE — Care Management CC44 (Signed)
 Condition Code 44 Documentation Completed  Patient Details  Name: NAMI STRAWDER MRN: [id number] Date of Birth: 04-28-1933   Condition Code 44 given:  Yes Patient signature on Condition Code 44 notice:  Yes Documentation of 2 MD's agreement:  Yes Code 44 added to claim:  Yes    [name], RN 08/12/2024, 3:44 PM

## 2024-08-12 NOTE — Plan of Care (Signed)
  Problem: Activity: Goal: Risk for activity intolerance will decrease Outcome: Progressing   Problem: Coping: Goal: Level of anxiety will decrease Outcome: Progressing   Problem: Pain Managment: Goal: General experience of comfort will improve and/or be controlled Outcome: Progressing   Problem: Safety: Goal: Ability to remain free from injury will improve Outcome: Progressing

## 2024-08-21 ENCOUNTER — Other Ambulatory Visit: Payer: Self-pay | Admitting: Cardiovascular Disease

## 2024-08-22 NOTE — Telephone Encounter (Signed)
 Prescription refill request for Eliquis  received. Indication:chf Last office visit:5/25 Scr:0.93  9/25 Age: 89 Weight:60.9  kg  Prescription refilled

## 2025-01-26 ENCOUNTER — Ambulatory Visit: Admitting: Neurology
